# Patient Record
Sex: Male | Born: 1960 | Race: Black or African American | Marital: Married | Smoking: Former smoker
Health system: Northeastern US, Academic
[De-identification: ages and names within clinical notes are randomized; demographics above are authoritative.]

## PROBLEM LIST (undated history)

## (undated) DIAGNOSIS — N289 Disorder of kidney and ureter, unspecified: Secondary | ICD-10-CM

## (undated) DIAGNOSIS — M1990 Unspecified osteoarthritis, unspecified site: Secondary | ICD-10-CM

## (undated) DIAGNOSIS — I509 Heart failure, unspecified: Secondary | ICD-10-CM

## (undated) DIAGNOSIS — Z992 Dependence on renal dialysis: Secondary | ICD-10-CM

## (undated) DIAGNOSIS — K635 Polyp of colon: Secondary | ICD-10-CM

## (undated) DIAGNOSIS — E7800 Pure hypercholesterolemia, unspecified: Secondary | ICD-10-CM

## (undated) DIAGNOSIS — K219 Gastro-esophageal reflux disease without esophagitis: Secondary | ICD-10-CM

## (undated) DIAGNOSIS — I959 Hypotension, unspecified: Secondary | ICD-10-CM

## (undated) DIAGNOSIS — E079 Disorder of thyroid, unspecified: Secondary | ICD-10-CM

## (undated) DIAGNOSIS — N186 End stage renal disease: Secondary | ICD-10-CM

## (undated) DIAGNOSIS — K559 Vascular disorder of intestine, unspecified: Secondary | ICD-10-CM

## (undated) HISTORY — PX: OTHER SURGICAL HISTORY: SHX169

## (undated) HISTORY — PX: DIALYSIS FISTULA CREATION: SHX611

## (undated) HISTORY — PX: HERNIA REPAIR: SHX51

---

## 1997-08-16 ENCOUNTER — Inpatient Hospital Stay (HOSPITAL_COMMUNITY): Admission: EM | Admit: 1997-08-16 | Discharge: 1997-08-19 | Payer: Self-pay | Admitting: Emergency Medicine

## 1997-09-13 ENCOUNTER — Emergency Department (HOSPITAL_COMMUNITY): Admission: EM | Admit: 1997-09-13 | Discharge: 1997-09-13 | Payer: Self-pay | Admitting: Emergency Medicine

## 1997-09-14 ENCOUNTER — Other Ambulatory Visit: Admission: RE | Admit: 1997-09-14 | Discharge: 1997-09-14 | Payer: Self-pay | Admitting: *Deleted

## 1997-09-16 ENCOUNTER — Other Ambulatory Visit: Admission: RE | Admit: 1997-09-16 | Discharge: 1997-09-16 | Payer: Self-pay | Admitting: *Deleted

## 1997-09-27 ENCOUNTER — Other Ambulatory Visit: Admission: RE | Admit: 1997-09-27 | Discharge: 1997-09-27 | Payer: Self-pay | Admitting: *Deleted

## 1997-11-02 ENCOUNTER — Ambulatory Visit (HOSPITAL_COMMUNITY): Admission: RE | Admit: 1997-11-02 | Discharge: 1997-11-02 | Payer: Self-pay | Admitting: *Deleted

## 1997-12-29 ENCOUNTER — Emergency Department (HOSPITAL_COMMUNITY): Admission: EM | Admit: 1997-12-29 | Discharge: 1997-12-29 | Payer: Self-pay | Admitting: Emergency Medicine

## 1998-02-09 ENCOUNTER — Emergency Department (HOSPITAL_COMMUNITY): Admission: EM | Admit: 1998-02-09 | Discharge: 1998-02-09 | Payer: Self-pay | Admitting: Emergency Medicine

## 1998-02-19 ENCOUNTER — Ambulatory Visit (HOSPITAL_COMMUNITY): Admission: RE | Admit: 1998-02-19 | Discharge: 1998-02-20 | Payer: Self-pay | Admitting: General Surgery

## 1998-02-19 ENCOUNTER — Ambulatory Visit (HOSPITAL_COMMUNITY): Admission: RE | Admit: 1998-02-19 | Discharge: 1998-02-19 | Payer: Self-pay | Admitting: Nephrology

## 1998-02-21 ENCOUNTER — Encounter: Payer: Self-pay | Admitting: Nephrology

## 1998-02-21 ENCOUNTER — Ambulatory Visit (HOSPITAL_COMMUNITY): Admission: RE | Admit: 1998-02-21 | Discharge: 1998-02-21 | Payer: Self-pay | Admitting: Nephrology

## 1998-04-27 ENCOUNTER — Ambulatory Visit (HOSPITAL_COMMUNITY): Admission: RE | Admit: 1998-04-27 | Discharge: 1998-04-27 | Payer: Self-pay | Admitting: *Deleted

## 1998-05-03 ENCOUNTER — Emergency Department (HOSPITAL_COMMUNITY): Admission: EM | Admit: 1998-05-03 | Discharge: 1998-05-03 | Payer: Self-pay | Admitting: Emergency Medicine

## 1998-05-03 ENCOUNTER — Encounter: Payer: Self-pay | Admitting: Emergency Medicine

## 1998-08-15 ENCOUNTER — Ambulatory Visit (HOSPITAL_COMMUNITY): Admission: RE | Admit: 1998-08-15 | Discharge: 1998-08-15 | Payer: Self-pay | Admitting: Nephrology

## 1998-08-15 ENCOUNTER — Encounter: Payer: Self-pay | Admitting: Nephrology

## 1998-09-02 ENCOUNTER — Encounter: Payer: Self-pay | Admitting: Vascular Surgery

## 1998-09-02 ENCOUNTER — Ambulatory Visit (HOSPITAL_COMMUNITY): Admission: RE | Admit: 1998-09-02 | Discharge: 1998-09-02 | Payer: Self-pay | Admitting: Vascular Surgery

## 1998-11-02 ENCOUNTER — Ambulatory Visit (HOSPITAL_COMMUNITY): Admission: RE | Admit: 1998-11-02 | Discharge: 1998-11-02 | Payer: Self-pay | Admitting: Nephrology

## 1999-01-11 ENCOUNTER — Other Ambulatory Visit: Admission: RE | Admit: 1999-01-11 | Discharge: 1999-01-11 | Payer: Self-pay

## 2000-07-20 ENCOUNTER — Encounter: Payer: Self-pay | Admitting: Vascular Surgery

## 2000-07-20 ENCOUNTER — Observation Stay (HOSPITAL_COMMUNITY): Admission: EM | Admit: 2000-07-20 | Discharge: 2000-07-21 | Payer: Self-pay | Admitting: *Deleted

## 2000-08-07 ENCOUNTER — Encounter: Payer: Self-pay | Admitting: Vascular Surgery

## 2000-08-09 ENCOUNTER — Encounter: Payer: Self-pay | Admitting: Vascular Surgery

## 2000-08-09 ENCOUNTER — Ambulatory Visit (HOSPITAL_COMMUNITY): Admission: RE | Admit: 2000-08-09 | Discharge: 2000-08-09 | Payer: Self-pay | Admitting: Vascular Surgery

## 2000-08-12 ENCOUNTER — Ambulatory Visit (HOSPITAL_COMMUNITY): Admission: RE | Admit: 2000-08-12 | Discharge: 2000-08-12 | Payer: Self-pay | Admitting: Nephrology

## 2000-08-12 ENCOUNTER — Encounter: Payer: Self-pay | Admitting: Nephrology

## 2000-10-05 ENCOUNTER — Inpatient Hospital Stay (HOSPITAL_COMMUNITY): Admission: AD | Admit: 2000-10-05 | Discharge: 2000-10-08 | Payer: Self-pay | Admitting: Nephrology

## 2000-10-07 ENCOUNTER — Encounter: Payer: Self-pay | Admitting: Nephrology

## 2001-02-23 ENCOUNTER — Emergency Department (HOSPITAL_COMMUNITY): Admission: EM | Admit: 2001-02-23 | Discharge: 2001-02-23 | Payer: Self-pay | Admitting: *Deleted

## 2002-08-19 IMAGING — XA IR [PERSON_NAME]/EXT/UNI*R*
1 series · 9 of 9 positions shown · non-contrast
Comparison: none

FINDINGS
CLINICAL DATA: RENAL FAILURE, QUESTION RIGHT VENOUS OCCLUSION.
RIGHT UPPER EXTREMITY VENOGRAM - 08/09/00:
WRITTEN INFORMED CONSENT WAS OBTAINED FROM THE PATIENT FOR THE PROCEDURE.  THE PATIENT CAME TO
INTERVENTIONAL RADIOLOGY WITH AN IV IN THE RIGHT UPPER FOREARM.  THIS WAS INJECTED WITH 30 CC OF
NONIONIC CONTRAST.  THIS SHOWS FILLING OF WHAT APPEARS TO BE THE RIGHT BASILIC VEIN AND CEPHALIC
VEIN, BOTH OF WHICH ARE PATENT.  CENTRALLY, THERE IS OCCLUSION OF THE RIGHT SUBCLAVIAN VEIN JUST
ABOVE THE RIGHT BRACHIOCEPHALIC VEIN, WITH MULTIPLE COLLATERAL VEINS FILLING IN THE NECK AND CHEST.
 RIGHT-SIDED INTERNAL JUGULAR DIALYSIS CATHETER IS SEEN WITH THE TIPS AT THE CAVOATRIAL JUNCTION
AND IN THE RIGHT ATRIUM.
IMPRESSION
CHRONIC OCCLUSION OF THE RIGHT SUBCLAVIAN VEIN JUST ABOVE THE RIGHT BRACHIOCEPHALIC VEIN.
DR. AREGAHEGN DEO WAS NOTIFIED OF THESE FINDINGS ON 08/09/00 AT THE TIME OF THE PROCEDURE.

[Series 1: run · 9 of 9 slices shown]
[im 1/9]
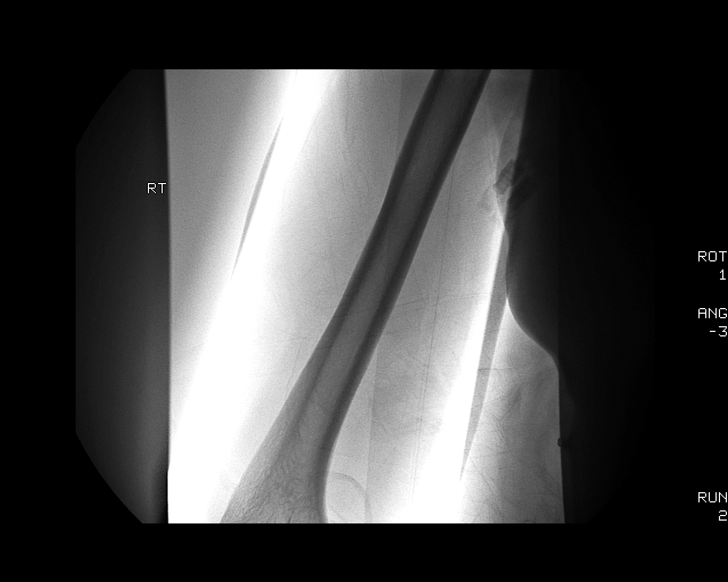
[im 2/9]
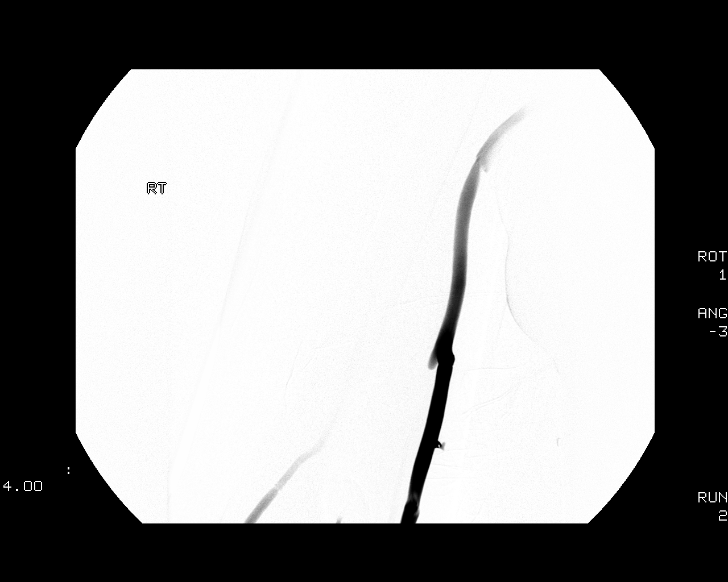
[im 3/9]
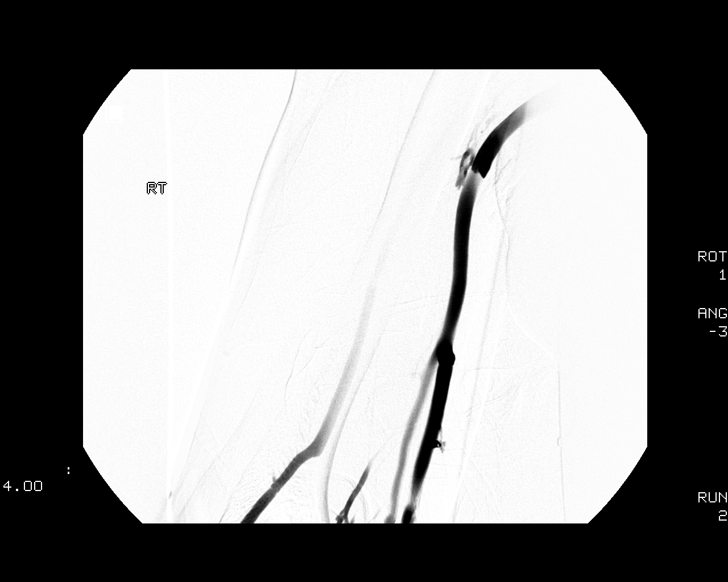
[im 4/9]
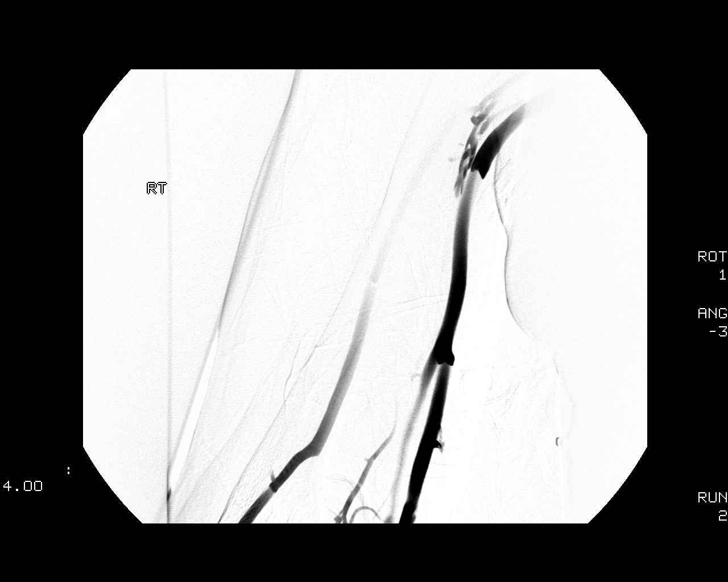
[im 5/9]
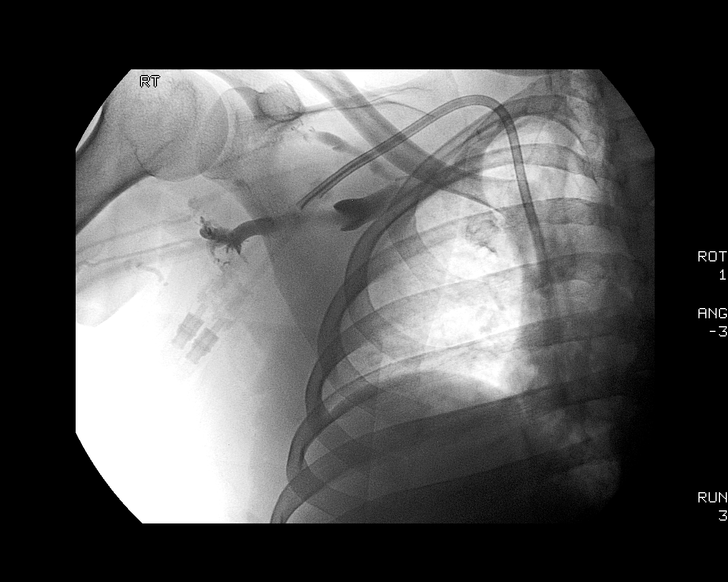
[im 6/9]
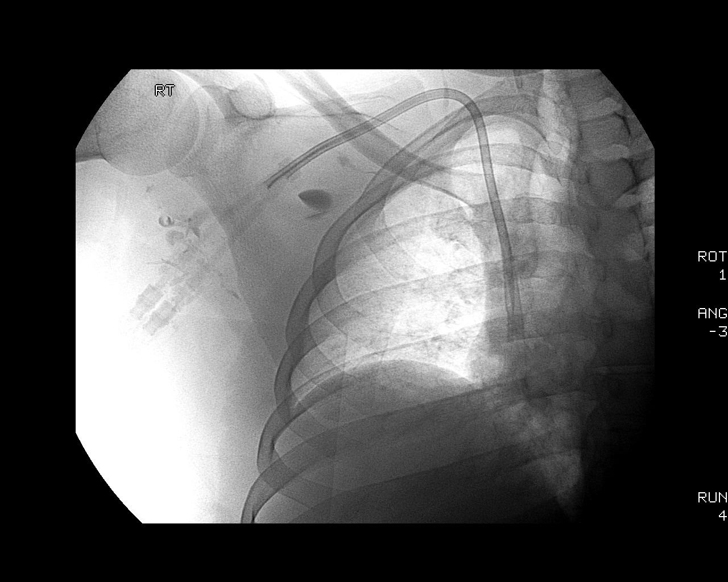
[im 7/9]
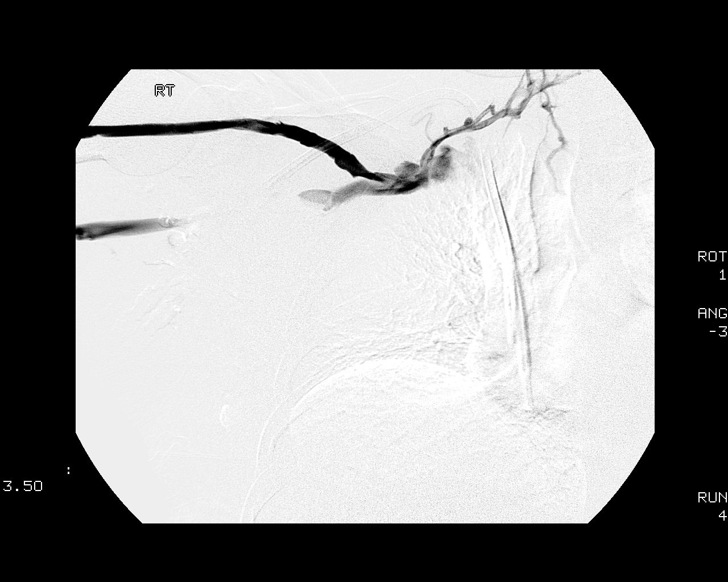
[im 8/9]
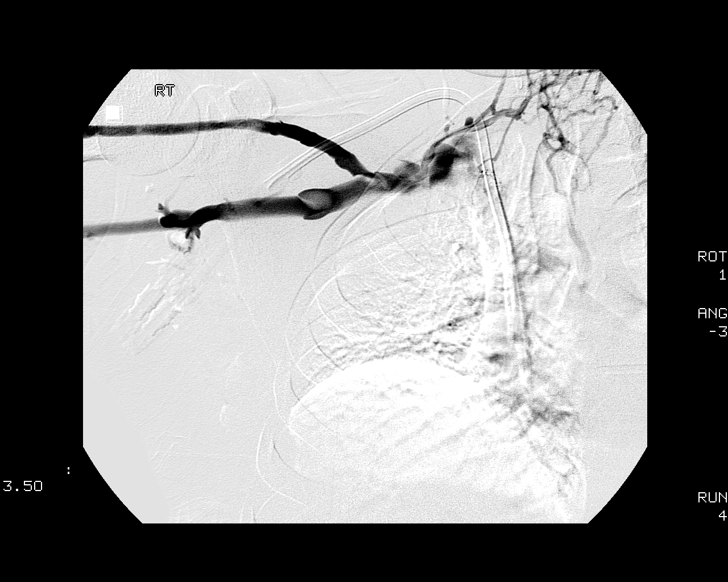
[im 9/9]
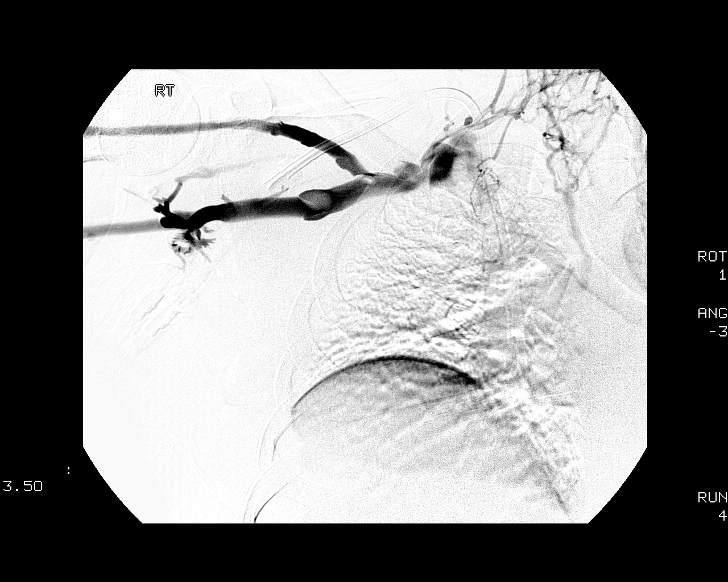

[9 of 9 positions shown; findings below may reference images not displayed]

## 2006-03-18 ENCOUNTER — Encounter: Payer: Self-pay | Admitting: Cardiology

## 2008-11-09 ENCOUNTER — Encounter: Payer: Self-pay | Admitting: Cardiology

## 2009-01-11 ENCOUNTER — Ambulatory Visit
Admit: 2009-01-11 | Discharge: 2009-01-11 | Disposition: A | Discharge status: Home / Self Care | Payer: Self-pay | Source: Ambulatory Visit | Admitting: Nephrology

## 2009-02-14 ENCOUNTER — Ambulatory Visit
Admit: 2009-02-14 | Discharge: 2009-02-14 | Disposition: A | Discharge status: Home / Self Care | Payer: Self-pay | Source: Ambulatory Visit | Admitting: Nephrology

## 2009-03-15 ENCOUNTER — Ambulatory Visit
Admit: 2009-03-15 | Discharge: 2009-03-15 | Disposition: A | Discharge status: Home / Self Care | Payer: Self-pay | Source: Ambulatory Visit | Admitting: Nephrology

## 2009-04-12 ENCOUNTER — Ambulatory Visit
Admit: 2009-04-12 | Discharge: 2009-04-12 | Disposition: A | Discharge status: Home / Self Care | Payer: Self-pay | Source: Ambulatory Visit | Attending: Nephrology | Admitting: Nephrology

## 2009-04-12 LAB — DATE/TIME NOT PROVIDED

## 2009-04-12 LAB — POTASSIUM: Potassium: 6.1 mmol/L — ABNORMAL HIGH (ref 3.3–5.1)

## 2009-05-05 ENCOUNTER — Ambulatory Visit
Admit: 2009-05-05 | Discharge: 2009-05-05 | Disposition: A | Discharge status: Home / Self Care | Payer: Self-pay | Source: Ambulatory Visit | Admitting: Nephrology

## 2009-07-12 ENCOUNTER — Ambulatory Visit
Admit: 2009-07-12 | Discharge: 2009-07-12 | Disposition: A | Discharge status: Home / Self Care | Payer: Self-pay | Source: Ambulatory Visit | Admitting: Nephrology

## 2009-08-18 ENCOUNTER — Other Ambulatory Visit: Payer: Self-pay | Admitting: Nurse Practitioner

## 2009-08-18 ENCOUNTER — Ambulatory Visit
Admit: 2009-08-18 | Discharge: 2009-08-18 | Disposition: A | Discharge status: Home / Self Care | Payer: Self-pay | Source: Ambulatory Visit | Admitting: Nephrology

## 2009-08-22 ENCOUNTER — Ambulatory Visit
Admit: 2009-08-22 | Discharge: 2009-08-22 | Disposition: A | Discharge status: Home / Self Care | Payer: Self-pay | Source: Ambulatory Visit | Attending: Nephrology | Admitting: Nephrology

## 2009-09-13 ENCOUNTER — Other Ambulatory Visit: Payer: Self-pay | Admitting: Radiology

## 2009-09-13 ENCOUNTER — Ambulatory Visit: Admit: 2009-09-13 | Payer: Self-pay | Source: Ambulatory Visit | Admitting: Radiology

## 2009-09-22 ENCOUNTER — Other Ambulatory Visit: Payer: Self-pay | Admitting: Nephrology

## 2009-09-23 ENCOUNTER — Ambulatory Visit
Admit: 2009-09-23 | Discharge: 2009-09-23 | Disposition: A | Discharge status: Home / Self Care | Payer: Self-pay | Source: Ambulatory Visit | Attending: Nephrology | Admitting: Nephrology

## 2009-09-23 LAB — PROTIME-INR
INR: 1 (ref 0.9–1.1)
Protime: 13.6 s (ref 11.9–14.7)

## 2009-09-23 MED ORDER — HEPARIN SODIUM 5000 UNIT/ML SQ *I*
SUBCUTANEOUS | Status: AC
Start: 2009-09-23 — End: 2009-09-23
  Filled 2009-09-23: qty 4

## 2009-09-23 MED ORDER — MIDAZOLAM HCL 1 MG/ML IJ SOLN *I* WRAPPED
INTRAMUSCULAR | Status: AC
Start: 2009-09-23 — End: 2009-09-23
  Filled 2009-09-23: qty 5

## 2009-09-23 MED ORDER — HEPARIN SODIUM (PORCINE) 1000 UNIT/ML IJ SOLN *WRAPPED*
Status: AC
Start: 2009-09-23 — End: 2009-09-23
  Filled 2009-09-23: qty 10

## 2009-09-23 MED ORDER — VANCOMYCIN HCL IN DEXTROSE 500 MG/100ML IV SOLN *I*
INTRAVENOUS | Status: DC
Start: 2009-09-23 — End: 2009-09-23
  Filled 2009-09-23: qty 100

## 2009-09-23 MED ORDER — HEPARIN SODIUM 5000 UNIT/ML SQ *I*
SUBCUTANEOUS | Status: AC
Start: 2009-09-23 — End: 2009-09-23
  Filled 2009-09-23: qty 2

## 2009-09-23 MED ORDER — FENTANYL CITRATE 50 MCG/ML IJ SOLN *WRAPPED*
INTRAMUSCULAR | Status: AC
Start: 2009-09-23 — End: 2009-09-23
  Filled 2009-09-23: qty 4

## 2009-09-23 MED ORDER — HEPARIN 100 UNIT/ML IV SOLN *A*
INTRAVENOUS | Status: AC
Start: 2009-09-23 — End: 2009-09-23
  Filled 2009-09-23: qty 5

## 2009-09-23 MED ORDER — LIDOCAINE HCL 1 % IJ SOLN
INTRAMUSCULAR | Status: AC
Start: 2009-09-23 — End: 2009-09-23
  Filled 2009-09-23: qty 30

## 2009-09-23 MED ORDER — VANCOMYCIN HCL 500 MG IV SOLR (50 MG/ML) *I*
INTRAVENOUS | Status: AC
Start: 2009-09-23 — End: 2009-09-23
  Filled 2009-09-23: qty 500

## 2009-10-13 ENCOUNTER — Ambulatory Visit
Admit: 2009-10-13 | Discharge: 2009-10-13 | Disposition: A | Discharge status: Home / Self Care | Payer: Self-pay | Source: Ambulatory Visit | Admitting: Nephrology

## 2009-11-15 ENCOUNTER — Ambulatory Visit
Admit: 2009-11-15 | Discharge: 2009-11-15 | Disposition: A | Discharge status: Home / Self Care | Payer: Self-pay | Source: Ambulatory Visit | Admitting: Nephrology

## 2009-12-14 ENCOUNTER — Ambulatory Visit
Admit: 2009-12-14 | Discharge: 2009-12-14 | Disposition: A | Discharge status: Home / Self Care | Payer: Self-pay | Source: Ambulatory Visit | Attending: Nephrology | Admitting: Nephrology

## 2009-12-14 ENCOUNTER — Other Ambulatory Visit: Payer: Self-pay | Admitting: Nephrology

## 2010-01-12 ENCOUNTER — Ambulatory Visit
Admit: 2010-01-12 | Discharge: 2010-01-12 | Disposition: A | Discharge status: Home / Self Care | Payer: Self-pay | Source: Ambulatory Visit | Admitting: Nephrology

## 2010-01-24 ENCOUNTER — Ambulatory Visit
Admit: 2010-01-24 | Discharge: 2010-01-24 | Disposition: A | Discharge status: Home / Self Care | Payer: Self-pay | Source: Ambulatory Visit | Attending: Nephrology | Admitting: Nephrology

## 2010-01-24 LAB — POTASSIUM: Potassium: 4.8 mmol/L (ref 3.3–5.1)

## 2010-01-24 LAB — DATE/TIME NOT PROVIDED

## 2010-02-14 ENCOUNTER — Ambulatory Visit
Admit: 2010-02-14 | Discharge: 2010-02-14 | Disposition: A | Discharge status: Home / Self Care | Payer: Self-pay | Source: Ambulatory Visit | Admitting: Nephrology

## 2010-02-16 ENCOUNTER — Other Ambulatory Visit: Payer: Self-pay | Admitting: Nephrology

## 2010-02-22 ENCOUNTER — Ambulatory Visit
Admit: 2010-02-22 | Discharge: 2010-02-22 | Disposition: A | Discharge status: Home / Self Care | Payer: Self-pay | Source: Ambulatory Visit | Attending: Nephrology | Admitting: Nephrology

## 2010-03-16 ENCOUNTER — Ambulatory Visit
Admit: 2010-03-16 | Discharge: 2010-03-16 | Disposition: A | Discharge status: Home / Self Care | Payer: Self-pay | Source: Ambulatory Visit | Admitting: Nephrology

## 2010-04-18 ENCOUNTER — Ambulatory Visit
Admit: 2010-04-18 | Discharge: 2010-04-18 | Disposition: A | Discharge status: Home / Self Care | Payer: Self-pay | Source: Ambulatory Visit | Admitting: Nephrology

## 2010-05-11 ENCOUNTER — Ambulatory Visit
Admit: 2010-05-11 | Discharge: 2010-05-11 | Disposition: A | Discharge status: Home / Self Care | Payer: Self-pay | Source: Ambulatory Visit | Admitting: Nephrology

## 2010-06-15 ENCOUNTER — Other Ambulatory Visit: Payer: Self-pay | Admitting: Nephrology

## 2010-06-21 ENCOUNTER — Ambulatory Visit
Admit: 2010-06-21 | Discharge: 2010-06-21 | Disposition: A | Discharge status: Home / Self Care | Payer: Self-pay | Source: Ambulatory Visit | Attending: Nephrology | Admitting: Nephrology

## 2010-06-26 ENCOUNTER — Encounter: Payer: Self-pay | Admitting: Gastroenterology

## 2010-06-26 ENCOUNTER — Other Ambulatory Visit: Payer: Self-pay | Admitting: Gastroenterology

## 2010-06-26 ENCOUNTER — Ambulatory Visit: Payer: Self-pay

## 2010-06-26 NOTE — Miscellaneous (Unsigned)
 Continuity of Care Record  Created: todo  From: DHAKAL, MADHAVENDRA  From:   From: [employer], EHR v10.2.7.53  To: Romm, Nolton  Purpose: Patient Use;       Medications  Calcium + D 600-200 MG-UNIT Tablet; TAKE 1 TABLET DAILY. ; RPT   Clindamycin HCl 300 MG Capsule; TAKE 1 TABLET  as directed prior to dental   work ; RPT   Diflunisal 500 MG Tablet ; RPT   Dulcolax 100 MG CAPS ; RPT   Fludrocortisone Acetate 0.1 MG Tablet; TAKE AS DIRECTED. ; RPT   Fluticasone Propionate 50 MCG/ACT Suspension; USE AS DIRECTED. ; RPT   Lipitor 10 MG Tablet; TAKE 1 TABLET DAILY AT BEDTIME. ; RPT   Midodrine HCl 10 MG Tablet; TAKE 1 TABLET DAILY ; RPT   Minocycline HCl 100 MG Capsule ; RPT   MiraLax Packet; USE AS DIRECTED. ; RPT   Nephro-Vite 0.8 MG Tablet; TAKE AS DIRECTED. ; RPT   Omeprazole 20 MG Capsule Delayed Release ; RPT   Renagel 800 MG Tablet; TAKE 1 TABLET DAILY ; RPT   Sensipar 30 MG Tablet ; RPT   Sodium Chloride TABS; USE AS DIRECTED. ; RPT

## 2010-07-13 ENCOUNTER — Ambulatory Visit
Admit: 2010-07-13 | Discharge: 2010-07-13 | Disposition: A | Discharge status: Home / Self Care | Payer: Self-pay | Source: Ambulatory Visit | Admitting: Nephrology

## 2010-08-03 ENCOUNTER — Ambulatory Visit: Payer: Self-pay | Admitting: Transplant Surgery

## 2010-08-03 VITALS — BP 70/50 | HR 92 | Temp 97.8°F | Ht 64.0 in | Wt 193.2 lb

## 2010-08-03 DIAGNOSIS — N289 Disorder of kidney and ureter, unspecified: Secondary | ICD-10-CM

## 2010-08-03 NOTE — Progress Notes (Signed)
 HPI:  Shane Pearson is a 50 year old gentleman who comes in for evaluation of  transplantation.  He indicates he was perfectly healthy until the mid 1980s  when he developed hypertension.  In 1986, he developed headaches as a  result of medications he was taking and upon following up with his  physician he was found to have proteinuria.  He followed up with nephrology  for the next 10 years and initiated peritoneal dialysis in 1998.  He did  well with peritoneal dialysis but unfortunately it only lasted for  approximately 1 year.  In 1999, he initiated hemodialysis and at the time  when he initiated he still had fairly high blood pressure and remained on  blood pressure medication for approximately 3 to 4 years.  In the next 3 to  4 years, he required a decrease in his blood pressure medications and then  subsequently in the following 3 to 4 years his blood pressure medications  were completely stopped.  Interestingly, in the next 3 to 4 years and  currently, he became hypotensive and he was started on midodrine and  Florinef to support his blood pressure.  He continues working full-time.  He is fairly active.  He enjoys his current life.  He indicates that he  does work full-time, he goes to church with regularity, and even vacations  with some regularity.    PAST MEDICAL HISTORY:  Significant for he was hypertensive and now he is  hypotensive requiring Florinef and Midodrine.  He has end-stage renal  disease and he has a history of sinusitis.    PAST SURGICAL HISTORY:  Significant for right inguinal hernia in 1985,  peritoneal dialysis catheter placement, and left subclavian catheter and  fistulas x 3, all of which have thrombosed.    FAMILY HISTORY:  His mother has a history of diabetes.  No history of  cancer.    DISCUSSION:  As we proceeded with the evaluation, I happened to ask Mr.  Pearson what, in fact, took so long for him to come through to be evaluated  for transplantation since he had initiated dialysis in  1999.  In short, he  responded that really this had been through somewhat extensive deliberation  with his nephrologist and that in fact he did not really feel that he  wanted a transplant and did not like the idea of the lack of guarantees  associated with transplantation.  Namely, he indicated that he did not like  the idea of being out of work for any extended period of time and also did  not like the idea of the side effects, specifically the result of diabetes  from immunosuppressive medications.  We had a very long and frank  discussion and Dr. Felicie Morn also participated in the discussion, and  essentially at this point Shane Pearson does not want to be listed for  evaluated for transplantation and we indicated that we would be happy to  reevaluate him in the near future or distant future and that at present he  should just go home and think about these issues and potentially the  disease progression that is associated with dialysis and that we would  rediscuss his options with him in the near future.                Electronically Signed and Finalized  by  Shaune Pollack, MD 08/09/2010 16:16  ___________________________________________  Shaune Pollack, MD      DD:   08/03/2010  DT:   08/03/2010  1:49 P  [id number]  [id number]    cc:

## 2010-08-05 ENCOUNTER — Encounter: Payer: Self-pay | Admitting: Critical Care Medicine

## 2010-08-05 ENCOUNTER — Emergency Department
Admission: EM | Admit: 2010-08-05 | Disposition: A | Discharge status: Home / Self Care | Payer: Self-pay | Source: Ambulatory Visit | Attending: Emergency Medicine | Admitting: Emergency Medicine

## 2010-08-05 ENCOUNTER — Ambulatory Visit
Admit: 2010-08-05 | Discharge: 2010-08-05 | Disposition: A | Discharge status: Home / Self Care | Payer: Self-pay | Source: Ambulatory Visit | Attending: Nephrology | Admitting: Nephrology

## 2010-08-05 ENCOUNTER — Other Ambulatory Visit: Payer: Self-pay | Admitting: Gastroenterology

## 2010-08-05 HISTORY — DX: End stage renal disease: N18.6

## 2010-08-05 HISTORY — DX: Pure hypercholesterolemia, unspecified: E78.00

## 2010-08-05 HISTORY — DX: Dependence on renal dialysis: Z99.2

## 2010-08-05 LAB — CBC
Hematocrit: 42 % (ref 40–51)
Hemoglobin: 13.4 g/dL — ABNORMAL LOW (ref 13.7–17.5)
MCV: 89 fL (ref 79–92)
Platelets: 197 10*3/uL (ref 150–330)
RBC: 4.7 MIL/uL (ref 4.6–6.1)
RDW: 16.4 % — ABNORMAL HIGH (ref 11.6–14.4)
WBC: 8.9 10*3/uL (ref 4.2–9.1)

## 2010-08-05 LAB — CBC AND DIFFERENTIAL
Baso # K/uL: 0 10*3/uL (ref 0.0–0.1)
Basophil %: 0.2 % (ref 0.2–1.2)
Eos # K/uL: 0.5 10*3/uL (ref 0.0–0.5)
Eosinophil %: 5.2 % (ref 0.8–7.0)
Hematocrit: 43 % (ref 40–51)
Hemoglobin: 14 g/dL (ref 13.7–17.5)
Lymph # K/uL: 1.9 10*3/uL (ref 1.3–3.6)
Lymphocyte %: 21.5 % — ABNORMAL LOW (ref 21.8–53.1)
MCV: 89 fL (ref 79–92)
Mono # K/uL: 0.8 10*3/uL (ref 0.3–0.8)
Monocyte %: 8.4 % (ref 5.3–12.2)
Neut # K/uL: 5.8 10*3/uL — ABNORMAL HIGH (ref 1.8–5.4)
Platelets: 205 10*3/uL (ref 150–330)
RBC: 4.9 MIL/uL (ref 4.6–6.1)
RDW: 16.5 % — ABNORMAL HIGH (ref 11.6–14.4)
Seg Neut %: 64.7 % (ref 34.0–67.9)
WBC: 8.9 10*3/uL (ref 4.2–9.1)

## 2010-08-05 LAB — PROTIME-INR
INR: 1.1 (ref 0.9–1.1)
INR: 1 (ref 0.9–1.1)
Protime: 13.8 s (ref 11.9–14.7)
Protime: 13.4 s (ref 11.9–14.7)

## 2010-08-05 LAB — POTASSIUM: Potassium: 5.2 mmol/L — ABNORMAL HIGH (ref 3.3–5.1)

## 2010-08-05 LAB — APTT
aPTT: 36.6 s — ABNORMAL HIGH (ref 22.3–35.3)
aPTT: 65.2 s — ABNORMAL HIGH (ref 22.3–35.3)

## 2010-08-05 LAB — BLOOD BANK HOLD LAVENDER

## 2010-08-05 LAB — BASIC METABOLIC PANEL
Anion Gap: 18 — ABNORMAL HIGH (ref 7–16)
CO2: 23 mmol/L (ref 20–28)
Calcium: 8.4 mg/dL — ABNORMAL LOW (ref 8.6–10.2)
Chloride: 94 mmol/L — ABNORMAL LOW (ref 96–108)
Creatinine: 16.28 mg/dL — ABNORMAL HIGH (ref 0.67–1.17)
GFR,Black: 4 * — AB
GFR,Caucasian: 3 * — AB
Glucose: 81 mg/dL (ref 74–106)
Lab: 55 mg/dL — ABNORMAL HIGH (ref 6–20)
Potassium: 5.5 mmol/L — ABNORMAL HIGH (ref 3.3–5.1)
Sodium: 135 mmol/L (ref 133–145)

## 2010-08-05 LAB — HOLD GRAY

## 2010-08-05 LAB — BLOOD BANK HOLD RED

## 2010-08-05 LAB — HOLD GREEN WITH GEL

## 2010-08-05 MED ORDER — FENTANYL CITRATE 50 MCG/ML IJ SOLN *WRAPPED*
INTRAMUSCULAR | Status: DC
Start: 2010-08-05 — End: 2010-08-05
  Filled 2010-08-05: qty 2

## 2010-08-05 MED ORDER — CEFAZOLIN 1000 MG IN STERILE WATER 10ML SYRINGE *I*
PREFILLED_SYRINGE | INTRAVENOUS | Status: DC
Start: 2010-08-05 — End: 2010-08-05
  Filled 2010-08-05: qty 20

## 2010-08-05 MED ORDER — HEPARIN SODIUM (PORCINE) 1000 UNIT/ML IJ SOLN *WRAPPED*
0.0000 mL | Status: DC | PRN
Start: 2010-08-05 — End: 2010-08-06
  Administered 2010-08-05: 2.6 mL

## 2010-08-05 MED ORDER — MIDAZOLAM HCL 1 MG/ML IJ SOLN *I* WRAPPED
INTRAMUSCULAR | Status: DC
Start: 2010-08-05 — End: 2010-08-05
  Filled 2010-08-05: qty 5

## 2010-08-05 MED ORDER — PARICALCITOL 2 MCG/ML IV SOLN *I*
8.0000 ug | INTRAVENOUS | Status: DC | PRN
Start: 2010-08-05 — End: 2010-08-06

## 2010-08-05 MED ORDER — VANCOMYCIN HCL IN DEXTROSE 500 MG/100ML IV SOLN *I*
INTRAVENOUS | Status: AC
Start: 2010-08-05 — End: 2010-08-05
  Filled 2010-08-05: qty 200

## 2010-08-05 MED ORDER — PARICALCITOL 2 MCG/ML IV SOLN *I*
INTRAVENOUS | Status: AC
Start: 2010-08-05 — End: 2010-08-05
  Administered 2010-08-05: 8 ug via INTRAVENOUS
  Filled 2010-08-05: qty 4

## 2010-08-05 MED ORDER — HEPARIN SODIUM (PORCINE) 1000 UNIT/ML IJ SOLN *WRAPPED*
0.0000 mL | Status: DC | PRN
Start: 2010-08-05 — End: 2010-08-06
  Administered 2010-08-05: 2.8 mL

## 2010-08-05 NOTE — ED Provider Progress Notes (Signed)
 ED Provider Progress Note     Patient returned from IR and HD.  Feeling better.  Will discharge to follow with HD on Tuesday and instruct to return from complications of HD cath.    Dispo: D/C.      Brandon Scarbrough Erlinda Hong, MD, 08/05/2010, 7:06 PM

## 2010-08-05 NOTE — ED Notes (Signed)
 Pt states he is ready to go home.  Completed his dialysis.  MD writing his discharge papers now.  Will take out his IV.

## 2010-08-05 NOTE — ED Notes (Signed)
 Pt sent in because his dialysis catheter is not working, missed dialysis this morning

## 2010-08-05 NOTE — Discharge Instructions (Signed)
You were seen and evaluated today after your home hemodialysis facility could not access your catheter.  Your testing is summarized above.  Your catheter was replaced and your received dialysis today.  You should keep your next hemodialysis appointment and follow up with your primary care doctor as needed.    Please read the home care instructions regarding the care of your catheter.    If you experience bleeding from your catheter site, spreading redness from the site, fainting, chest pain, difficulty breathing or ANY WORSENING OR CONCERNING SYMPTOM return to the emergency department or call 911.

## 2010-08-05 NOTE — Procedures (Signed)
 Procedure Report    Please enter procedure, pre- and and post-procedure diagnoses in fields above and removed this line of text.    PROCEDURE NOTE    Time out documentation completed in Procedure navigator prior to procedure:  Yes    Indications  Malfunctioning dialysis catheter    Procedure Details  Removal of old catheter, plasty of fibrin sheath and placement of new cath    Findings Fibrin sheath from tip of catheter to right atrium    Complications  None    Condition  good    Plan/Orders  Chest Xray later    EBL: 20 cc    Specimens  no    Disposition  Back to ED    Toma Copier, MD  08/05/2010  2:20 PM

## 2010-08-05 NOTE — Consults (Addendum)
 Nephrology Attending Attestation: 08/05/2010 3:28 PM  _________________________________________________________________________________  Overall Impression and Recommendations:      Asked to see this patient for provision of hemodialysis after catheter placement.50 y.o. male currently with end-stage renal disease on hemodialysis with a malfunctioning tunnelled catheter now with a new catheter placed today by IR.  He was seen and examined during hemodialysis     Endf-stage renal disease - currently dialyzing on F180 x 4 hours, 2K bath, UF 3 kg as toleraged   Dialysis access - new catheter functioning well    I have seen and examined the patient.  Please see the eMR  for supporting details, medications, labs, imaging, and progress notes, which I have personally reviewed.  The renal fellow note below reflects my own examination, input and findings.  I have also discussed relevant management issues with the Nephrology Team and the team caring for the patient.   _________________________________________________________________________________  Felicie Morn, MD,PHD:  08/05/2010 3:28 PM  Nephrology Fellow (Dialysis) Consult Note 08/05/2010 11:56 AM    Reason for consultation: Nephrology service was asked to see and provide management of ESRD on HD.  Request provider: Darlina Guys, MD  History of present illness:  50 y.o.male with ESRD secondary to hypertension + proteinuria on HD for 13 years at [facility] sechedule TTS, last HD on 08/03/2010 presented to [facility] for HD catheter replacement as his catheter is not providing enough blood flow for HD. This catheter has been in place for 6 weeks.  He is currently asymptomatic and we are consulting for provision of hemodialysis. Dr Arlyce Dice sent him here to have his HD catheter changed by IR.    Past Medical History   Diagnosis Date   . ESRD (end stage renal disease)    . Dialysis patient    . Hypercholesterolemia    HTN  Anemia    Past Surgical History   Procedure Date    . Right inguinal hernia repair    Multiple AVFs    Allergies   Allergen Reactions   . Coconut Flavor Nausea And Vomiting   . No Known Latex Allergy    . Amoxicillin Rash     History     Social History   . Marital Status: Married     Spouse Name: N/A     Number of Children: N/A   . Years of Education: N/A     Occupational History   . Not on file.     Social History Main Topics   . Smoking status: Former Smoker -- 0.3 packs/day for 15 years     Types: Cigarettes     Quit date: 08/02/1992   . Smokeless tobacco: Not on file   . Alcohol Use: No   . Drug Use: No   . Sexually Active: Not on file     Other Topics Concern   . Not on file     Social History Narrative   . No narrative on file     Family History   Problem Relation Age of Onset   . Diabetes Mother    . Asthma Mother    . Coronary art dis Father      No current facility-administered medications for this encounter.     Current Outpatient Prescriptions   Medication   . sevelamer (RENAGEL) 800 MG tablet   . midodrine (PROAMATINE) 10 MG tablet   . fluticasone (FLONASE) 50 MCG/ACT nasal spray   . atorvastatin (LIPITOR) 10 MG tablet   . fludrocortisone (  FLORINEF) 0.1 mg tablet   . b complex-vitamin c-folic acid (NEPHRO-VITE) tablet   . calcium-vitamin D (OSCAL-500) 500-200 MG-UNIT per tablet   . cinacalcet (SENSIPAR) 30 MG tablet   . omeprazole (PRILOSEC) 20 MG capsule   . docusate sodium (COLACE) 100 MG capsule   . minocycline (MINOCIN) 100 MG capsule   . diflunisal (DOLOBID) 500 MG TABS     Review of Systems - 10 systems review was negative    Physical examination:  Filed Vitals:    08/05/10 1012   BP: 99/46   Pulse: 72   Temp: 36.4 C (97.5 F)   Resp: 16   Height:    Weight:      PHYSICAL EXAM  General Good condition, NAD.  HEENT Pale, hydrated  Neck  No JVD  Heart S1 S2 regular  Lungs  Bilaterally clear  Abdomen Soft, non tender  Ext No edema  Neurologic  Alert, non focal.    Laboratory data:  Recent Results (from the past 24 hour(s))   HOLD GRAY       Component  Value Range    Hold Grey HOLD TUBE     CBC    Collection Time    08/05/10  7:15 AM       Component Value Range    WBC 8.9  4.2 - 9.1 (THOU/uL)    RBC 4.7  4.6 - 6.1 (MIL/uL)    Hemoglobin 13.4 (*) 13.7 - 17.5 (g/dL)    Hematocrit 42  40 - 51 (%)    MCV 89  79 - 92 (fL)    RDW 16.4 (*) 11.6 - 14.4 (%)    Platelets 197  150 - 330 (THOU/uL)   PROTIME-INR    Collection Time    08/05/10  7:15 AM       Component Value Range    Protime 13.8  11.9 - 14.7 (sec)    INR 1.1  0.9 - 1.1    APTT    Collection Time    08/05/10  7:15 AM       Component Value Range    aPTT 36.6 (*) 22.3 - 35.3 (sec)   POTASSIUM    Collection Time    08/05/10  7:15 AM       Component Value Range    Potassium 5.2 (*) 3.3 - 5.1 (mmol/L)   CBC AND DIFFERENTIAL    Collection Time    08/05/10  9:11 AM       Component Value Range    WBC 8.9  4.2 - 9.1 (THOU/uL)    RBC 4.9  4.6 - 6.1 (MIL/uL)    Hemoglobin 14.0  13.7 - 17.5 (g/dL)    Hematocrit 43  40 - 51 (%)    MCV 89  79 - 92 (fL)    RDW 16.5 (*) 11.6 - 14.4 (%)    Platelets 205  150 - 330 (THOU/uL)    Seg Neut % 64.7  34.0 - 67.9 (%)    Lymphocyte % 21.5 (*) 21.8 - 53.1 (%)    Monocyte % 8.4  5.3 - 12.2 (%)    Eosinophil % 5.2  0.8 - 7.0 (%)    Basophil % 0.2  0.2 - 1.2 (%)    Neut # K/uL 5.8 (*) 1.8 - 5.4 (THOU/uL)    Lymph # K/uL 1.9  1.3 - 3.6 (THOU/uL)    Mono # K/uL 0.8  0.3 - 0.8 (THOU/uL)    Eos # K/uL 0.5  0.0 - 0.5 (THOU/uL)    Baso # K/uL 0.0  0.0 - 0.1 (THOU/uL)   BASIC METABOLIC PANEL    Collection Time    08/05/10  9:11 AM       Component Value Range    Glucose 81  74 - 106 (mg/dL)    Sodium 161  096 - 045 (mmol/L)    Potassium 5.5 (*) 3.3 - 5.1 (mmol/L)    Chloride 94 (*) 96 - 108 (mmol/L)    CO2 23  20 - 28 (mmol/L)    Anion Gap 18 (*) 7 - 16     UN 55 (*) 6 - 20 (mg/dL)    Creatinine 40.98 (*) 0.67 - 1.17 (mg/dL)    GFR,Caucasian 3 (*)     GFR,Black 4 (*)     Calcium 8.4 (*) 8.6 - 10.2 (mg/dL)   PROTIME-INR    Collection Time    08/05/10  9:11 AM       Component Value Range    Protime 13.4   11.9 - 14.7 (sec)    INR 1.0  0.9 - 1.1    APTT    Collection Time    08/05/10  9:11 AM       Component Value Range    aPTT 65.2 (*) 22.3 - 35.3 (sec)   HOLD GREEN WITH GEL    Collection Time    08/05/10  9:11 AM       Component Value Range    Hold Green (w/gel,spun) HOLD TUBE     BLOOD BANK HOLD LAVENDER    Collection Time    08/05/10  9:11 AM       Component Value Range    Bld Bank Hld Lav Lav In Bld Bank     BLOOD BANK HOLD RED    Collection Time    08/05/10  9:11 AM       Component Value Range    Bld Bank Hld Red Red In [facility]       Radiology impressions (last 3 days):  No results found.    Assessment:   Shane Pearson is a 50 y.o. male with ESRD on HD admitted for change of HD catheter  And HD after that.    Plan:  1.  ESRD on HD.    -will dialyze on 08/05/2010 with 2.0 K bath. UF minimal given BP; no heparin after he gets his new catheter  -Will obtain outpatient packet from HD unit.  -Continue nephrovite.  2.  Anemia:   -Hgb 14  -Will check outpatient orders for EPO  3.  CKD-BMD:   -Ca 8.4  -No acute intervention needed.  -Will check HD orders for Vit D analogs.  4.  Hypertension:   -BP is low normal now  -Will follow   -Resume home BP medication regimen.  5.  Access:   -To get new HD catheter as above     Thank you indeed for this consult, we will follow along with you    Author: Nicholas Lose, MD  as of: 08/05/2010  at: 11:56 AM

## 2010-08-05 NOTE — Preop H&P (Addendum)
 OUTPATIENT  Chief Complaint: Dialysis catheter malfunctioning    History of Present Illness:  HPI  50 year old male with left dialysis catheter and prior history of multiple episodes of malfunctioning catheter and fibrin sheath angioplasty, presents for malfunctioning dialysis catheter.    Past Medical History   Diagnosis Date   . ESRD (end stage renal disease)    . Dialysis patient    . Hypercholesterolemia      Past Surgical History   Procedure Date   . Right inguinal hernia repair      Family History   Problem Relation Age of Onset   . Diabetes Mother    . Asthma Mother    . Coronary art dis Father      History     Social History   . Marital Status: Married     Spouse Name: N/A     Number of Children: N/A   . Years of Education: N/A     Social History Main Topics   . Smoking status: Former Smoker -- 0.3 packs/day for 15 years     Types: Cigarettes     Quit date: 08/02/1992   . Smokeless tobacco: None   . Alcohol Use: No   . Drug Use: No   . Sexually Active: None     Other Topics Concern   . None     Social History Narrative   . None       Allergies:   Allergies   Allergen Reactions   . Coconut Flavor Nausea And Vomiting   . No Known Latex Allergy    . Amoxicillin Rash       Current Facility-Administered Medications   Medication Dose Route Frequency   . heparin (porcine) injection 0-2,000 Units  0-2 mL Intracatheter PRN   . heparin (porcine) injection 0-2,000 Units  0-2 mL Intracatheter PRN   . paricalcitol (ZEMPLAR) injection 8 mcg  8 mcg Intravenous PRN     Current Outpatient Prescriptions   Medication   . sevelamer (RENAGEL) 800 MG tablet   . midodrine (PROAMATINE) 10 MG tablet   . fluticasone (FLONASE) 50 MCG/ACT nasal spray   . atorvastatin (LIPITOR) 10 MG tablet   . fludrocortisone (FLORINEF) 0.1 mg tablet   . b complex-vitamin c-folic acid (NEPHRO-VITE) tablet   . calcium-vitamin D (OSCAL-500) 500-200 MG-UNIT per tablet   . cinacalcet (SENSIPAR) 30 MG tablet   . omeprazole (PRILOSEC) 20 MG capsule   .  docusate sodium (COLACE) 100 MG capsule   . minocycline (MINOCIN) 100 MG capsule   . diflunisal (DOLOBID) 500 MG TABS        Review of Systems:   ROS    Last Nursing documented pain:  0-10 Scale: 0 (08/05/10 0747)      Patient Vitals for the past 24 hrs:   BP Temp Temp src Pulse Resp SpO2 Height Weight   08/05/10 1307 96/44 mmHg 36.2 C (97.2 F) TEMPORAL 72  16  100 % - -   08/05/10 1012 99/46 mmHg 36.4 C (97.5 F) TEMPORAL 72  16  100 % - -   08/05/10 0747 93/65 mmHg 36.2 C (97.2 F) TEMPORAL 71  18  98 % 162.6 cm (5\' 4" ) 90.266 kg (199 lb)     O2 Device: None (Room air) (08/05/10 1307)      Physical Exam   Constitutional: He appears well-developed.   HENT:   Head: Normocephalic.   Cardiovascular: Normal rate, regular rhythm and normal heart sounds.  Pulmonary/Chest: Effort normal and breath sounds normal.   Abdominal: Soft. Bowel sounds are normal.       Lab Results:   All labs in the last 24 hours   Recent Results (from the past 24 hour(s))   HOLD GRAY       Component Value Range    Hold Grey HOLD TUBE     CBC    Collection Time    08/05/10  7:15 AM       Component Value Range    WBC 8.9  4.2 - 9.1 (THOU/uL)    RBC 4.7  4.6 - 6.1 (MIL/uL)    Hemoglobin 13.4 (*) 13.7 - 17.5 (g/dL)    Hematocrit 42  40 - 51 (%)    MCV 89  79 - 92 (fL)    RDW 16.4 (*) 11.6 - 14.4 (%)    Platelets 197  150 - 330 (THOU/uL)   PROTIME-INR    Collection Time    08/05/10  7:15 AM       Component Value Range    Protime 13.8  11.9 - 14.7 (sec)    INR 1.1  0.9 - 1.1    APTT    Collection Time    08/05/10  7:15 AM       Component Value Range    aPTT 36.6 (*) 22.3 - 35.3 (sec)   POTASSIUM    Collection Time    08/05/10  7:15 AM       Component Value Range    Potassium 5.2 (*) 3.3 - 5.1 (mmol/L)   CBC AND DIFFERENTIAL    Collection Time    08/05/10  9:11 AM       Component Value Range    WBC 8.9  4.2 - 9.1 (THOU/uL)    RBC 4.9  4.6 - 6.1 (MIL/uL)    Hemoglobin 14.0  13.7 - 17.5 (g/dL)    Hematocrit 43  40 - 51 (%)    MCV 89  79 - 92 (fL)    RDW  16.5 (*) 11.6 - 14.4 (%)    Platelets 205  150 - 330 (THOU/uL)    Seg Neut % 64.7  34.0 - 67.9 (%)    Lymphocyte % 21.5 (*) 21.8 - 53.1 (%)    Monocyte % 8.4  5.3 - 12.2 (%)    Eosinophil % 5.2  0.8 - 7.0 (%)    Basophil % 0.2  0.2 - 1.2 (%)    Neut # K/uL 5.8 (*) 1.8 - 5.4 (THOU/uL)    Lymph # K/uL 1.9  1.3 - 3.6 (THOU/uL)    Mono # K/uL 0.8  0.3 - 0.8 (THOU/uL)    Eos # K/uL 0.5  0.0 - 0.5 (THOU/uL)    Baso # K/uL 0.0  0.0 - 0.1 (THOU/uL)   BASIC METABOLIC PANEL    Collection Time    08/05/10  9:11 AM       Component Value Range    Glucose 81  74 - 106 (mg/dL)    Sodium 621  308 - 657 (mmol/L)    Potassium 5.5 (*) 3.3 - 5.1 (mmol/L)    Chloride 94 (*) 96 - 108 (mmol/L)    CO2 23  20 - 28 (mmol/L)    Anion Gap 18 (*) 7 - 16     UN 55 (*) 6 - 20 (mg/dL)    Creatinine 84.69 (*) 0.67 - 1.17 (mg/dL)    GFR,Caucasian 3 (*)     GFR,Black  4 (*)     Calcium 8.4 (*) 8.6 - 10.2 (mg/dL)   PROTIME-INR    Collection Time    08/05/10  9:11 AM       Component Value Range    Protime 13.4  11.9 - 14.7 (sec)    INR 1.0  0.9 - 1.1    APTT    Collection Time    08/05/10  9:11 AM       Component Value Range    aPTT 65.2 (*) 22.3 - 35.3 (sec)   HOLD GREEN WITH GEL    Collection Time    08/05/10  9:11 AM       Component Value Range    Hold Green (w/gel,spun) HOLD TUBE     BLOOD BANK HOLD LAVENDER    Collection Time    08/05/10  9:11 AM       Component Value Range    Bld Bank Hld Lav Lav In Bld Bank     BLOOD BANK HOLD RED    Collection Time    08/05/10  9:11 AM       Component Value Range    Bld Bank Hld Red Red In [facility]         Radiology impressions (last 3 days):  No results found.    Currently Active/Followed Hospital Problems:  There are no hospital problems to display for this patient.      Assessment: 50 year old male with left dialysis catheter and prior history of multiple episodes of malfunctioning catheter and fibrin sheath angioplasty, presents for malfunctioning dialysis catheter.    Plan: Left dialysis catheter check and change  and possible angioplasty of fibrin sheath    Author: Toma Copier, MD  Note created: 08/05/2010  at: 1:43 PM    Agree with the resident

## 2010-08-05 NOTE — Progress Notes (Signed)
 Nursing Procedure Note    Shane Pearson  [id number]    Procedure: hd cath change      Status: Completed    Patient tolerated procedure well     Procedure Dressing Site located:Left IJ  Dressing Type:sterile gauze/tegaderm  Biopatch:no  Dressing status:Clean, dry and intact   Hematoma:Not evident    Cardiovascular:  Peripheral Pulses: N/A   Fistula: N/A    Neuro Assessment:Patient is at pre-procedure baseline    Report given to:Unit Nurse. Unit dialysis Name alaina            Vanco 500 mg in bowl. Pt requested no sedation and tolerated procedure well without

## 2010-08-05 NOTE — ED Notes (Signed)
 Pt going to IR for replacement dialysis catheter, dialysis informed of procedure

## 2010-08-05 NOTE — ED Notes (Signed)
Pt is ambulatory in room 

## 2010-08-05 NOTE — ED Notes (Signed)
 Pt came from [facility], flow to dialysis cath is poor, sent  here for eval.denies pain to left cath chest area.dialysis cath was placed 6 weeks ago.pt alert and oriented x3.

## 2010-08-05 NOTE — ED Notes (Signed)
 Refused offer of food or snack.  States he wants to go home to eat.

## 2010-08-05 NOTE — ED Provider Notes (Addendum)
 History     Chief Complaint   Patient presents with   . Vascular Access Problem     HPI Comments: Shane Pearson is a 50 year old on HD (ESRD, no UOP, T, Th, Sat dialysis [facility]) who presents after being unable to access his HD cath for dialysis this morning.    At approximately 0600 the patient was at HD when they couldn't adequately access his left SC HD cath.  He reports that there has been some difficulty accessing this cath for the last 2 weeks, but they had always been able to access it.  They placed "Cath Flow" on Thursday, but the catheter was functioning even worse.    He was to have 4L taken off today.    He denies chest pain, dyspnea, edema of his extremities, abdominal pain.    Last PO intake yesterday at 2100.    The history is provided by the patient.       Past Medical History   Diagnosis Date   . ESRD (end stage renal disease)    . Dialysis patient    . Hypercholesterolemia        Past Surgical History   Procedure Date   . Right inguinal hernia repair        Family History   Problem Relation Age of Onset   . Diabetes Mother    . Asthma Mother    . Coronary art dis Father        Social History      reports that he quit smoking about 18 years ago. His smoking use included Cigarettes. He has a 4.5 pack-year smoking history. He does not have any smokeless tobacco history on file. He reports that he does not drink alcohol or use illicit drugs. His sexual activity history not on file.    Living Situation     Questions Responses    Patient lives with Spouse    Homeless     Caregiver for other family member     External Services     Employment Employed    Domestic Violence Risk           Review of Systems   Review of Systems   Constitutional: Negative for fever and chills.   HENT: Negative for sore throat, trouble swallowing, neck pain and neck stiffness.    Eyes: Negative for visual disturbance.   Respiratory: Negative for shortness of breath.    Cardiovascular: Negative for chest pain.      Gastrointestinal: Negative for nausea, vomiting and abdominal pain.   Genitourinary: Negative for flank pain.        Aneuric   Musculoskeletal: Negative for myalgias, joint swelling and arthralgias.   Skin: Negative for rash.   Neurological: Negative for syncope.   Hematological: Does not bruise/bleed easily.   Psychiatric/Behavioral: Negative for confusion and decreased concentration.       Physical Exam   BP 93/65  Pulse 71  Temp(Src) 36.2 C (97.2 F) (Temporal)  Resp 18  Ht 1.626 m (5\' 4" )  Wt 90.266 kg (199 lb)  BMI 34.16 kg/m2  SpO2 98%    Physical Exam   Nursing note and vitals reviewed.  Constitutional: He is oriented to person, place, and time. He appears well-developed and well-nourished.        The patient is seated, breathing easily, appears comfortable and cooperative.   HENT:   Head: Normocephalic and atraumatic.   Mouth/Throat: Oropharynx is clear and moist. No oropharyngeal  exudate.   Eyes: Conjunctivae are normal. No scleral icterus.   Neck: Normal range of motion. Neck supple.   Cardiovascular: Normal rate, regular rhythm and normal heart sounds.  Exam reveals no gallop and no friction rub.    No murmur heard.       HR 70-80  Radial pulses difficult to palpate, but hands are warm, well perfused.    2+ DP pulses.   Pulmonary/Chest: Effort normal and breath sounds normal. No respiratory distress. He has no wheezes. He has no rales.            No focal lung findings.  Mildly prolonged expiratory phase.   Abdominal: Soft. Bowel sounds are normal. He exhibits no distension. There is no tenderness. There is no rebound and no guarding.   Musculoskeletal: He exhibits no edema and no tenderness.   Neurological: He is alert and oriented to person, place, and time.   Skin: Skin is warm and dry. He is not diaphoretic.   Psychiatric: He has a normal mood and affect. His behavior is normal.       Medical Decision Making   MDM  Number of Diagnoses or Management Options  Acute hemodialysis patient:    Mechanical complications due to hemodialysis catheter:   Diagnosis management comments: Patient seen by me today, 08/05/2010 at 0843    Assessment:  50 y.o., male comes to the ED with inaccessible HD catheter in the setting of ESRD patient reliant on HD.  The patient appears to have had accelerating difficulty with his HD cath and it has finally stopped functioning today and it will need to be changed out.  We will obtain labs to evaluate the urgency of his HD needs, but per the nephrology attending who called him in it appears to be needed today.    Differential Diagnosis includes: HD cath problem, needing HD    Plan:   1. Call IR - spoke with IR consult on call.  They will discuss the case with Dr. Raleigh Callas who was notified by Dr. Arlyce Dice  2. Call Nephrology - Spoke with Dr. Nicholas Lose - he will evaluate the patient's HD needs and facilitate as needed.  3. NPO  4. Insert IV - CBC, BMP, INR/PTT, hold blood bank  5. EKG - eval hyper K.    Disposition to depend on above and clinical status.    Rachel Bo, MD - Pager [phone]  08/05/2010, 9:20 AM            LARS Erlinda Hong, MD    Darlina Guys, MD  08/05/10 [phone]    Patient seen by me on arrival date of 08/05/2010 at 0851    History:   I reviewed this patient, reviewed the resident note and agree  Exam:   I examined this patient, reviewed the resident note and agree    Decision Making:   I discussed with the documented resident decision making  and agree          Author Darlina Guys, MD    Darlina Guys, MD  08/07/10 [phone]

## 2010-08-05 NOTE — ED Notes (Signed)
 Bed:PA-10<BR> Expected date:08/05/10<BR> Expected time: 7:30 AM<BR> Means of arrival: Other<BR> Comments:<BR> ADULT CALL-IN...    Patient Name: Shane Pearson, Shane Pearson...    PCP/Service Referral: [phone] call from Dr. Alben Deeds, Nephrology, [phone]...    Patient Information Note: At [facility] but unable access site. Needs new dialysis cath, Dr. Raleigh Callas from IR is aware. Will need HD at [facility], has 4 liters of fluid on board, Heather,Charge Nurse, is aware...    Author Wetzel Bjornstad RN as of 08/05/18 12 at 7:30 AM

## 2010-08-09 LAB — EKG 12-LEAD
P: 52 degrees
QRS: 14 degrees
Rate: 70 {beats}/min
Severity: NORMAL
Severity: NORMAL
T: 21 degrees

## 2010-08-17 ENCOUNTER — Ambulatory Visit
Admit: 2010-08-17 | Discharge: 2010-08-17 | Disposition: A | Discharge status: Home / Self Care | Payer: Self-pay | Source: Ambulatory Visit | Admitting: Nephrology

## 2010-09-14 ENCOUNTER — Ambulatory Visit
Admit: 2010-09-14 | Discharge: 2010-09-14 | Disposition: A | Discharge status: Home / Self Care | Payer: Self-pay | Source: Ambulatory Visit | Admitting: Nephrology

## 2010-12-05 ENCOUNTER — Ambulatory Visit
Admit: 2010-12-05 | Discharge: 2010-12-05 | Disposition: A | Discharge status: Home / Self Care | Payer: Self-pay | Source: Ambulatory Visit | Attending: Nephrology | Admitting: Nephrology

## 2010-12-05 LAB — CBC AND DIFFERENTIAL
Baso # K/uL: 0 10*3/uL (ref 0.0–0.1)
Basophil %: 0.3 % (ref 0.2–1.2)
Eos # K/uL: 0.1 10*3/uL (ref 0.0–0.5)
Eosinophil %: 0.5 % — ABNORMAL LOW (ref 0.8–7.0)
Hematocrit: 44 % (ref 40–51)
Hemoglobin: 14.8 g/dL (ref 13.7–17.5)
Lymph # K/uL: 1.1 10*3/uL — ABNORMAL LOW (ref 1.3–3.6)
Lymphocyte %: 9.9 % — ABNORMAL LOW (ref 21.8–53.1)
MCV: 83 fL (ref 79–92)
Mono # K/uL: 1.3 10*3/uL — ABNORMAL HIGH (ref 0.3–0.8)
Monocyte %: 12.5 % — ABNORMAL HIGH (ref 5.3–12.2)
Neut # K/uL: 8.2 10*3/uL — ABNORMAL HIGH (ref 1.8–5.4)
Platelets: 238 10*3/uL (ref 150–330)
RBC: 5.3 MIL/uL (ref 4.6–6.1)
RDW: 19.1 % — ABNORMAL HIGH (ref 11.6–14.4)
Seg Neut %: 76.8 % — ABNORMAL HIGH (ref 34.0–67.9)
WBC: 10.6 10*3/uL — ABNORMAL HIGH (ref 4.2–9.1)

## 2011-02-21 ENCOUNTER — Encounter: Payer: Self-pay | Admitting: Family Medicine

## 2011-02-21 ENCOUNTER — Emergency Department
Admission: EM | Admit: 2011-02-21 | Disposition: A | Discharge status: Home / Self Care | Payer: Self-pay | Source: Ambulatory Visit

## 2011-02-21 LAB — CBC AND DIFFERENTIAL
Baso # K/uL: 0 10*3/uL (ref 0.0–0.1)
Basophil %: 0.1 % — ABNORMAL LOW (ref 0.2–1.2)
Eos # K/uL: 0.2 10*3/uL (ref 0.0–0.5)
Eosinophil %: 2.6 % (ref 0.8–7.0)
Hematocrit: 52 % — ABNORMAL HIGH (ref 40–51)
Hemoglobin: 17.2 g/dL (ref 13.7–17.5)
Lymph # K/uL: 1.5 10*3/uL (ref 1.3–3.6)
Lymphocyte %: 18.8 % — ABNORMAL LOW (ref 21.8–53.1)
MCV: 93 fL — ABNORMAL HIGH (ref 79–92)
Mono # K/uL: 0.6 10*3/uL (ref 0.3–0.8)
Monocyte %: 7.7 % (ref 5.3–12.2)
Neut # K/uL: 5.8 10*3/uL — ABNORMAL HIGH (ref 1.8–5.4)
Platelets: 266 10*3/uL (ref 150–330)
RBC: 5.6 MIL/uL (ref 4.6–6.1)
RDW: 18.5 % — ABNORMAL HIGH (ref 11.6–14.4)
Seg Neut %: 70.8 % — ABNORMAL HIGH (ref 34.0–67.9)
WBC: 8.2 10*3/uL (ref 4.2–9.1)

## 2011-02-21 LAB — APTT: aPTT: 37.7 s (ref 25.8–37.9)

## 2011-02-21 LAB — PROTIME-INR
INR: 1.1 (ref 1.0–1.2)
Protime: 10.7 s (ref 9.2–12.3)

## 2011-02-21 LAB — HOLD GREEN WITH GEL

## 2011-02-21 MED ORDER — VANCOMYCIN HCL IN DEXTROSE 5 MG/ML IV SOLN *I*
INTRAVENOUS | Status: AC | PRN
Start: 2011-02-21 — End: 2011-02-21
  Administered 2011-02-21: 500 mg via INTRAVENOUS

## 2011-02-21 MED ORDER — FENTANYL CITRATE 50 MCG/ML IJ SOLN *WRAPPED*
INTRAMUSCULAR | Status: AC
Start: 2011-02-21 — End: 2011-02-21
  Filled 2011-02-21: qty 2

## 2011-02-21 MED ORDER — FENTANYL CITRATE 50 MCG/ML IJ SOLN *WRAPPED*
INTRAMUSCULAR | Status: AC | PRN
Start: 2011-02-21 — End: 2011-02-21
  Administered 2011-02-21: 50 ug via INTRAVENOUS

## 2011-02-21 MED ORDER — VANCOMYCIN HCL IN DEXTROSE 500 MG/100ML IV SOLN *I*
INTRAVENOUS | Status: AC
Start: 2011-02-21 — End: 2011-02-21
  Filled 2011-02-21: qty 200

## 2011-02-21 MED ORDER — MIDAZOLAM HCL 1 MG/ML IJ SOLN *I* WRAPPED
INTRAMUSCULAR | Status: AC | PRN
Start: 2011-02-21 — End: 2011-02-21
  Administered 2011-02-21: 1 mg via INTRAVENOUS

## 2011-02-21 MED ORDER — MIDAZOLAM HCL 1 MG/ML IJ SOLN *I* WRAPPED
INTRAMUSCULAR | Status: AC
Start: 2011-02-21 — End: 2011-02-21
  Filled 2011-02-21: qty 5

## 2011-02-21 NOTE — Discharge Instructions (Signed)
Continue your medications as prescribed   Attend your routine dialysis tomorrow, as scheduled  Follow-up with your PCP and nephrologist as needed.  Return to the ED with any symptoms or further catheter problems

## 2011-04-26 ENCOUNTER — Encounter: Payer: Self-pay | Admitting: General Practice

## 2011-04-26 ENCOUNTER — Other Ambulatory Visit: Payer: Self-pay | Admitting: Nurse Practitioner

## 2011-04-26 ENCOUNTER — Telehealth: Payer: Self-pay

## 2011-04-26 ENCOUNTER — Ambulatory Visit
Admit: 2011-04-26 | Discharge: 2011-04-26 | Disposition: A | Discharge status: Home / Self Care | Payer: Self-pay | Source: Ambulatory Visit | Attending: Nephrology | Admitting: Nephrology

## 2011-04-26 ENCOUNTER — Telehealth: Payer: Self-pay | Admitting: General Practice

## 2011-04-26 LAB — PROTIME-INR
INR: 1 (ref 1.0–1.2)
Protime: 10.4 s (ref 9.2–12.3)

## 2011-04-26 NOTE — Telephone Encounter (Signed)
Pt not available for phone confirmation, left message for call back to [facility] to confirm appointment 04/27/2011.

## 2011-04-26 NOTE — Telephone Encounter (Signed)
Left message on answering machine, NPO X 6 hours, meds with sips of water, needs a driver

## 2011-04-27 ENCOUNTER — Ambulatory Visit
Admit: 2011-04-27 | Discharge: 2011-04-27 | Disposition: A | Discharge status: Home / Self Care | Payer: Self-pay | Source: Ambulatory Visit | Attending: Nephrology | Admitting: Nephrology

## 2011-04-27 ENCOUNTER — Encounter: Payer: Self-pay | Admitting: Nephrology

## 2011-04-27 HISTORY — DX: Hypotension, unspecified: I95.9

## 2011-04-27 MED ORDER — HEPARIN SODIUM (PORCINE) 5000 UNIT/ML IJ SOLN *I*
INTRAMUSCULAR | Status: AC | PRN
Start: 2011-04-27 — End: 2011-04-27
  Administered 2011-04-27: 2.1 mL

## 2011-04-27 MED ORDER — HEPARIN SODIUM (PORCINE) 5000 UNIT/ML IJ SOLN *I*
INTRAMUSCULAR | Status: AC
Start: 2011-04-27 — End: 2011-04-27
  Filled 2011-04-27: qty 10

## 2011-04-27 NOTE — Progress Notes (Signed)
VENOUS PORT OF DIALYSIS CATHETER PORT REPAIRED BY DR. Janne Napoleon.  2.1 ML OF 5000 UNIT/ML HEPARIN DWELLED IN BLUE PORT.  SITE REDRESSED WITH BIOPATCH.  PATIENT INSTRUCTED TO INFORM DIALYSIS RN'S THAT 2.1 ML OF HEPARIN SHOULD BE DWELLED IN BLUE PORT OF CATHETER AFTER DIALYSIS TREATMENT IS COMPLETE.  RED PORT IS 1.6 ML.  PATIENT HAS GOOD UNDERSTANDING AND ALSO HAS WRITTEN INSTRUCTION.

## 2011-04-27 NOTE — Procedures (Signed)
Procedure Report    Blue hub of dialysis catheter was replaced with a new Hub using a repair kit. Instruction were send to dialysis and referring providers to prime the lumen with 2.1 mL of heparin.

## 2011-04-27 NOTE — Discharge Instructions (Signed)
IR Post Procedure Discharge Instructions  04/27/2011  4:49 PM    Physician performing test/procedure: Dr. Elvia Collum    Your test or procedure was fistula repair    Instructions: as follows    Diet: Resume your previous diet    Activity:     Dressing: A pressure dressing/bandage was applied to the procedure site. Remove dressing or bandage the morning after your procedure. and If bleeding occurs at procedure site, apply firm pressure to the site for 10 minutes. If bleeding persists, apply pressure to site and notify your physician immediately.    Medications: resume previous medications. If you are taking warfarin (Coumadin), check with your doctor as to when to resume taking this medication.    Call your physician IF: You notice signs of infection at procedure site: redness, foul smelling drainage, increased swelling, fever, chills, bleeding, or severe pain at procedure site.        I have read and understand the above instructions and I have received a copy.    ________________________________________________________________Patient Signature:     Date:    ________________________________________________________________  Nurse Signature:     Date:        During working hours (Monday - Friday 8am-5pm), you may call [phone] [facility]) or [phone] [facility]). After 5pm and on weekends, if you have any questions regarding the procedure, please call [phone] to speak witht he [facility] nurse or [phone] and ask to speak with the Radiology resident on call.  the Emergency Department is open 24 hours a day at [facility] and [place] if emergency treatment is required.

## 2012-05-24 DIAGNOSIS — J309 Allergic rhinitis, unspecified: Secondary | ICD-10-CM | POA: Insufficient documentation

## 2012-08-01 ENCOUNTER — Encounter: Payer: Self-pay | Admitting: Gastroenterology

## 2012-08-13 ENCOUNTER — Telehealth: Payer: Self-pay

## 2012-08-13 NOTE — Telephone Encounter (Signed)
Office or direct?

## 2012-08-13 NOTE — Telephone Encounter (Signed)
Schedule EUS

## 2012-08-13 NOTE — Telephone Encounter (Signed)
Please call Mae at Dr. Holly Bodily office to schedule EUS w/ Dr. Janora Norlander for the pt, per 5/23 Media referral at [phone]

## 2012-08-14 NOTE — Telephone Encounter (Signed)
Called pt and left message

## 2012-08-15 ENCOUNTER — Encounter: Payer: Self-pay | Admitting: Gastroenterology

## 2012-08-15 NOTE — Telephone Encounter (Signed)
Called pt and left message .... Mailed letter

## 2012-08-29 ENCOUNTER — Encounter: Payer: Self-pay | Admitting: Gastroenterology

## 2012-08-29 NOTE — Telephone Encounter (Signed)
Faxed ... Pt has been scheduled/notified for 8.1

## 2012-08-29 NOTE — Telephone Encounter (Signed)
Mae from Dr. Holly Bodily office called requesting that the letter that was sent out to the patient be faxed to her at [phone].

## 2012-10-08 ENCOUNTER — Telehealth: Payer: Self-pay | Admitting: Gastroenterology

## 2012-10-08 NOTE — Telephone Encounter (Signed)
Pt has been rescheduled/notified to 8.6

## 2012-10-08 NOTE — Telephone Encounter (Signed)
Patient calling because he states he got a phone call stating his 8.1 EUS was canceled and rescheduled to 8.25. There is no documentation of this and he is still scheduled for 8.1. Please call him back at [phone] to verify.

## 2012-10-10 ENCOUNTER — Encounter: Payer: Self-pay | Admitting: Gastroenterology

## 2012-10-15 ENCOUNTER — Encounter: Payer: Self-pay | Admitting: Gastroenterology

## 2012-10-15 ENCOUNTER — Ambulatory Visit
Admit: 2012-10-15 | Discharge: 2012-10-15 | Disposition: A | Discharge status: Home / Self Care | Payer: Self-pay | Attending: Gastroenterology | Admitting: Gastroenterology

## 2012-10-15 HISTORY — DX: Polyp of colon: K63.5

## 2012-10-15 HISTORY — DX: Disorder of thyroid, unspecified: E07.9

## 2012-10-15 MED ORDER — FENTANYL CITRATE 50 MCG/ML IJ SOLN *WRAPPED*
INTRAMUSCULAR | Status: AC | PRN
Start: 2012-10-15 — End: 2012-10-15
  Administered 2012-10-15 (×3): 25 ug via INTRAVENOUS

## 2012-10-15 MED ORDER — SODIUM CHLORIDE 0.9 % IV SOLN WRAPPED *I*
100.0000 mL/h | Status: DC
Start: 2012-10-15 — End: 2012-10-16
  Administered 2012-10-15: 100 mL/h via INTRAVENOUS

## 2012-10-15 MED ORDER — MIDAZOLAM HCL 1 MG/ML IJ SOLN *I* WRAPPED
INTRAMUSCULAR | Status: AC | PRN
Start: 2012-10-15 — End: 2012-10-15
  Administered 2012-10-15 (×2): 1 mg via INTRAVENOUS
  Administered 2012-10-15 (×4): 2 mg via INTRAVENOUS

## 2012-10-15 MED ORDER — MIDAZOLAM HCL 5 MG/5ML IJ SOLN *I*
INTRAMUSCULAR | Status: AC
Start: 2012-10-15 — End: 2012-10-15
  Filled 2012-10-15: qty 15

## 2012-10-15 MED ORDER — FENTANYL CITRATE 50 MCG/ML IJ SOLN *WRAPPED*
INTRAMUSCULAR | Status: AC
Start: 2012-10-15 — End: 2012-10-15
  Filled 2012-10-15: qty 4

## 2012-10-15 NOTE — Procedures (Signed)
Procedure Report  Endoscopic Ultrasound Procedure Note   Date of Procedure: 10/15/2012   Referring Physician: Royston Cowper   Primary Care Provider: Royston Cowper   Attending Physician: Olga Coaster, MD  Fellow:None  Indication: Duodenal poly        Preprocedure Evaluation: The patient was stable for sedation and endoscopy. An informed consent was obtained prior to the procedure, including risks and benefits of dilation, control of hemorrhage and tissue removal and sampling/drainage by fine needle aspiration.     Medications: Fentanyl 75 mcg IV and Midazolam 10 mg IV  were administered incrementally over the course of the procedure to achieve an adequate level of conscious sedation to meet patient and procedural needs.    Endoscope: Olympus radial echoendoscope and EGD scope      Procedure Details: The patient was placed in the left lateral position and monitored continuously with automatic blood pressure monitoring, pulse oximetry, and direct observations. Bite block placed and oxygen given via nasal cannula. An endoscope was inserted through the orophanynx; and positioned in the GI tract .  The ultrasound examination was performed as outlined in the findings below.         Findings:   EGD showed a bilobed polyp in the duodenal bulb. On EUS examination the polyp was localized to the mucosa. The 2 lobes measured 7.8 x 11.2 mm and 7.2 x 6.7 mm.    The polyp was raised with normal saline and then removed. The specimen was retrieved.         Complications: none  Impression:   1. Duodenal polyp localized to the mucosa.  2. Polyp removed after raising with normal saline   Recommendations:   Check the results of biopsies.  Follow with Dr. Dolores Frame

## 2012-10-15 NOTE — Discharge Instructions (Signed)
GASTROENTEROLOGY UNIT  DISCHARGE INSTRUCTIONS FOR GASTROSCOPY      10/15/2012                2:16 PM    EGD and polyp removed     Do not drive, operate heavy machinery, drink alcoholic beverages, make important personal or business decisions, or sign legal documents until the next day.    Return to your usual medications   Return to your usual diet    Things You May Expect:   A mild sore throat (Warm liquids or lozenges will soothe the throat)   You were given medication to help you relax during the test. You need to rest at home for at least 4-6 hours.    You Should Call Your Doctor For Any of the Following:   Fever, abdominal or chest pain that does not go away   Cough or trouble breathing   Bloody or black stools.   Pain or redness at the IV site    If you have a serious problem after hours.  Call [phone] to reach the GI physician on call.    If you are unable to reach your doctor, go to the [facility] Emergency Department.    Follow Up Care:   Follow up with Dr. Dolores Frame    New Prescriptions    No medications on file

## 2012-10-15 NOTE — Preop H&P (Addendum)
OUTPATIENT PRE-PROCEDURE H&P    Chief Complaint / Indications for Procedure: duodenal polyp  Past Medical History:     Past Medical History   Diagnosis Date   . ESRD (end stage renal disease)    . Dialysis patient    . Hypercholesterolemia    . Hypotension    . Colon polyp    . Thyroid disease      hypo     Past Surgical History   Procedure Laterality Date   . Right inguinal hernia repair       Family History   Problem Relation Age of Onset   . Diabetes Mother    . Asthma Mother    . Coronary art dis Father      History     Social History   . Marital Status: Married     Spouse Name: N/A     Number of Children: N/A   . Years of Education: N/A     Social History Main Topics   . Smoking status: Former Smoker -- 0.30 packs/day for 15 years     Types: Cigarettes     Quit date: 08/02/1992   . Smokeless tobacco: None   . Alcohol Use: No   . Drug Use: No   . Sexually Active: None     Other Topics Concern   . None     Social History Narrative   . None       Allergies:    Allergies   Allergen Reactions   . Coconut Flavor Nausea And Vomiting   . Amoxicillin Rash   . No Known Latex Allergy        Medications:  Current Outpatient Prescriptions   Medication   . pantoprazole (PROTONIX) 40 MG EC tablet   . aspirin 81 MG EC tablet   . Omega-3 Fatty Acids (FISH OIL) 1200 MG CPDR   . Polyethylene Glycol 3350 (MIRALAX PO)   . sevelamer (RENAGEL) 800 MG tablet   . midodrine (PROAMATINE) 10 MG tablet   . fluticasone (FLONASE) 50 MCG/ACT nasal spray   . atorvastatin (LIPITOR) 10 MG tablet   . fludrocortisone (FLORINEF) 0.1 mg tablet   . b complex-vitamin c-folic acid (NEPHRO-VITE) tablet   . calcium-vitamin D (OSCAL-500) 500-200 MG-UNIT per tablet   . cinacalcet (SENSIPAR) 30 MG tablet   . omeprazole (PRILOSEC) 20 MG capsule   . docusate sodium (COLACE) 100 MG capsule   . minocycline (MINOCIN) 100 MG capsule   . diflunisal (DOLOBID) 500 MG TABS     Current Facility-Administered Medications   Medication Dose Route Frequency   . sodium  chloride 0.9 % IV  100 mL/hr Intravenous Continuous      Filed Vitals:    10/15/12 1252   BP: 89/53   Pulse: 70   Temp: 36.4 C (97.5 F)   Resp: 16   Height: 167.6 cm (5\' 6" )   Weight: 88.7 kg (195 lb 8.8 oz)           Physical Examination:  Head/Nose/Throat:negative  Lungs:Lungs clear  Cardiovascular:normal S1 and S2  Abdomen: abdomen soft, non-tender, nondistended, normal active bowel sounds, no masses or organomegaly      Lab Results: none    Radiology impressions (last 30 days):  No results found.    Currently Active Problems:  There is no problem list on file for this patient.         UPDATES TO PATIENT'S CONDITION on the DAY OF SURGERY/PROCEDURE    I. Updates  to Patient's Condition (to be completed by a provider privileged to complete a H&P, following reassessment of the patient by the provider):    Full H&P done today; no updates needed.    II. Procedure Readiness   I have reviewed the patient's H&P and updated condition. By completing and signing this form, I attest that this patient is ready for surgery/procedure.      III. Attestation   I have reviewed the updated information regarding the patient's condition and it is appropriate to proceed with the planned surgery/procedure.    Lidya Mccalister Janora Norlander, MD as of 1:06 PM 10/15/2012

## 2012-10-17 LAB — SURGICAL PATHOLOGY

## 2013-01-08 DIAGNOSIS — I70209 Unspecified atherosclerosis of native arteries of extremities, unspecified extremity: Secondary | ICD-10-CM | POA: Insufficient documentation

## 2013-09-01 ENCOUNTER — Other Ambulatory Visit: Payer: Self-pay | Admitting: Nephrology

## 2013-09-04 ENCOUNTER — Telehealth: Payer: Self-pay

## 2013-09-04 NOTE — Telephone Encounter (Signed)
Pre procedure call made to Hadley Pen     Allergies   Allergen Reactions    Coconut Flavor Nausea And Vomiting    Amoxicillin Rash    No Known Latex Allergy        If patient has a known contrast allergy appropriate radiology provider notified:   []  yes []  no [x]  N/A    Patient/Family Anesthetic Sensitivity: []  Yes [x]  No    Does the patient have a history of sleep apnea?   []  Yes-pt instructed to bring C-pap or Bi-pap machine   [x]  No     Patient instructed to:   Take all of your medications with a sip of water.   Hold herbal medications and supplements   Hold anti-coagulation medication per SIR guidelines []  Yes []  No [x]  NA   To take half dose of insulin on the morning of the procedure []  Yes [] No [] NA    NPO Instructions for SEDATED procedures:   No milk products/solid foods 6 hours before procedure   No breast milk 4 hours before procedure   No clear liquids 2 hours before procedure    Transportation has been arranged for patient Post-Procedure: [x]  Yes []  No -friend    Additional patient instructions:   Please leave personal belongings and valuables at home     Please do not bring children     Please arrive at (time) 12;30 and anticipate that you will be here for 2.0 hours.   Patient given instructions on how to find the radiology department.  Labs:  IR Lab Values From Results Review Latest Ref Rng 04/26/2011   HCT 40 - 51 % -   HGB 13.7 - 17.5 g/dL -   PLT 150 - 330 THOU/uL -   INR 1.0 - 1.2 1.0   K 3.3 - 5.1 mmol/L -   NA 133 - 145 mmol/L -   UN 6 - 20 mg/dL -   CREAT 0.67 - 1.17 mg/dL -   GFRB - -   GFRC - -      If no labs available patient asked if they have had labs drawn outside the [facility] system in the last 30 days: No      [] If no current labs, instructed patient to arrive 30 minutes prior to original arrival time.

## 2013-09-07 ENCOUNTER — Encounter: Payer: Self-pay | Admitting: Nephrology

## 2013-09-07 ENCOUNTER — Ambulatory Visit
Admit: 2013-09-07 | Discharge: 2013-09-07 | Disposition: A | Discharge status: Home / Self Care | Payer: Self-pay | Source: Ambulatory Visit | Attending: Nephrology | Admitting: Nephrology

## 2013-09-07 MED ORDER — VANCOMYCIN HCL IN DEXTROSE 5 MG/ML IV SOLN *I*
INTRAVENOUS | Status: AC | PRN
Start: 2013-09-07 — End: 2013-09-07
  Administered 2013-09-07: 500 mg via INTRAVENOUS

## 2013-09-07 MED ORDER — FENTANYL CITRATE 50 MCG/ML IJ SOLN *WRAPPED*
INTRAMUSCULAR | Status: AC | PRN
Start: 2013-09-07 — End: 2013-09-07
  Administered 2013-09-07: 25 ug via INTRAVENOUS

## 2013-09-07 MED ORDER — HEPARIN SODIUM (PORCINE) 1000 UNIT/ML IJ SOLN *WRAPPED*
Status: DC | PRN
Start: 2013-09-07 — End: 2013-09-08
  Administered 2013-09-07: 1.9 mL via INTRAVENOUS

## 2013-09-07 MED ORDER — IOHEXOL 240 MG/ML (OMNIPAQUE) IV SOLN *I*
INTRAMUSCULAR | Status: DC | PRN
Start: 2013-09-07 — End: 2013-09-08
  Administered 2013-09-07: 10 mL via INTRAVENOUS

## 2013-09-07 NOTE — Discharge Instructions (Signed)
Tunneled Central Line Discharge Instructions   09/07/2013  2:00 PM    Provider performing test/procedure: Dr. Lyman Bishop    Patient received medication? Yes. You have been given medicine, Fentanyl, that may make you sleepy. Do not drive, operate  heavy machinery, drink alcoholic beverages, make important personal or business decisions, or sign legal documents until the next day.     You must be with a responsible adult the first night after your test.   You can eat your usual diet.   Rest for the remainder of the day. Return to normal activities in 24 hours.   You may not shower or get the dressing wet.   Do not remove the bandage from the catheter site. If bleeding occurs at the catheter site, apply firm pressure to the site for 10 minutes. If the bandage is soaked with blood, apply pressure and call your doctor immediately.   Resume your usual medications.   If you take warfarin (Coumadin), check with your primary doctor before restarting this medicine.   Do not take aspirin (including Anacin), Ibuprofen (Advil,Motrin,Nuprin) or naproxen sodium (Aleve) for the next 24 to 48 hours unless you directed by your primary doctor.   Acetaminophen (Tylenol) is the drug of choice if you have pain.     Call Your Doctor IF:   You have signs of infection at the catheter site: fever, chills, severe pain or swelling, foul smelling drainage.   If your catheter gets pulled out.      Monday - Friday 8am-5pm: For questions or concerns regarding your procedure please call [facility] ([facility] [phone].    After hours/Weekends:  [facility] patients may call [phone] and ask to  speak with the Radiology resident on call.   [facility] patients may call [phone] to speak with the [place] or call [phone] and ask to speak with the radiology resident on call.    The Emergency Department is open 24 hours a day at [address] if emergency treatment is required.

## 2013-09-07 NOTE — Preop H&P (Signed)
OUTPATIENT  Chief Complaint: malfunction dialysis catheter     History of Present Illness:  HPI    Past Medical History   Diagnosis Date    ESRD (end stage renal disease)     Dialysis patient     Hypercholesterolemia     Hypotension     Colon polyp     Thyroid disease      hypo     Past Surgical History   Procedure Laterality Date    Right inguinal hernia repair       Family History   Problem Relation Age of Onset    Diabetes Mother     Asthma Mother     Coronary art dis Father      History     Social History    Marital Status: Married     Spouse Name: N/A     Number of Children: N/A    Years of Education: N/A     Social History Main Topics    Smoking status: Former Smoker -- 0.30 packs/day for 15 years     Types: Cigarettes     Quit date: 08/02/1992    Smokeless tobacco: None    Alcohol Use: No    Drug Use: No    Sexual Activity: None     Other Topics Concern    None     Social History Narrative       Allergies:   Allergies   Allergen Reactions    Coconut Flavor Nausea And Vomiting    Amoxicillin Rash    No Known Latex Allergy        Current Outpatient Prescriptions   Medication    aspirin 81 MG EC tablet    Polyethylene Glycol 3350 (MIRALAX PO)    sevelamer (RENAGEL) 800 MG tablet    midodrine (PROAMATINE) 10 MG tablet    fluticasone (FLONASE) 50 MCG/ACT nasal spray    atorvastatin (LIPITOR) 10 MG tablet    fludrocortisone (FLORINEF) 0.1 mg tablet    b complex-vitamin c-folic acid (NEPHRO-VITE) tablet    calcium-vitamin D (OSCAL-500) 500-200 MG-UNIT per tablet    cinacalcet (SENSIPAR) 30 MG tablet    omeprazole (PRILOSEC) 20 MG capsule    docusate sodium (COLACE) 100 MG capsule    diflunisal (DOLOBID) 500 MG TABS    pantoprazole (PROTONIX) 40 MG EC tablet    Omega-3 Fatty Acids (FISH OIL) 1200 MG CPDR    minocycline (MINOCIN) 100 MG capsule     Current Facility-Administered Medications   Medication Dose Route Frequency    Vancomycin (VANCOCIN) IV   Intravenous  Code/Trauma/Sedation Medication        Review of Systems:   ROS    Last Nursing documented pain:  0-10 Scale: 0 (09/07/13 1250)      Patient Vitals for the past 24 hrs:   BP Temp Temp src Pulse Resp SpO2 Height Weight   09/07/13 1250 84/57 mmHg 36.2 C (97.2 F) TEMPORAL 67 16 95 % 162.6 cm (5\' 4" ) 88.451 kg (195 lb)     O2 Device: None (Room air) (09/07/13 1250)      Physical Exam    Lab Results:   none    Radiology impressions (last 3 days):  No results found.    Currently Active/Followed Hospital Problems:  There are no hospital problems to display for this patient.          Plan: change catheter over a wire    Author: Lyman Bishop, MD  Note created:  09/07/2013  at: 1:25 PM

## 2013-09-07 NOTE — Procedures (Signed)
Procedure Report    Tunneled dialysis catheter was changed over a wire with 14.5 F Palindrome catheter  Fibrin sheat was cleaned using 10 mm balloon

## 2013-09-07 NOTE — Progress Notes (Signed)
[facility] Nursing Procedure Note    Ankith Issa  [id number]    Procedure: replace dialysis catheter over wire        Status: Completed    Patient tolerated procedure well     Procedure Dressing Site located:left IJ  Dressing Type:sterile gauze/tegaderm and suture  Biopatch:no  Dressing status:Clean, dry and intact   Hematoma:Not evident  Medication received:Fentanyl 23mcg  Cardiovascular:   Peripheral Pulses: Present post procedure      Fistula: N/A    Neuro Assessment:Patient is at pre-procedure baseline    Report given [id number] recovery nurse. Name: Lenna Sciara      Last Filed Vitals    09/07/13 1346   BP: 90/57   Pulse: 64   Temp:    Resp: 14   SpO2: 96%     O2 Device: Nasal cannula

## 2013-09-07 NOTE — Progress Notes (Signed)
Here to have dialysis catheter changed over a wire. Has been having problems on the machine with high pressures. Patient to be known to have very low blood pressure. Feels fine, has problems if he moves to fast or gets up to fast. Had a full dialysis treatment on Saturday 09/05/2013 @ [facility]

## 2013-11-07 ENCOUNTER — Encounter (HOSPITAL_COMMUNITY): Payer: Self-pay | Admitting: Emergency Medicine

## 2013-11-07 ENCOUNTER — Inpatient Hospital Stay (HOSPITAL_COMMUNITY)
Admission: EM | Admit: 2013-11-07 | Discharge: 2013-11-07 | DRG: 314 | Disposition: A | Discharge status: Home / Self Care | Payer: Medicare Other | Attending: Nephrology | Admitting: Nephrology

## 2013-11-07 ENCOUNTER — Emergency Department (HOSPITAL_COMMUNITY): Payer: Medicare Other

## 2013-11-07 DIAGNOSIS — M129 Arthropathy, unspecified: Secondary | ICD-10-CM | POA: Diagnosis present

## 2013-11-07 DIAGNOSIS — Z7982 Long term (current) use of aspirin: Secondary | ICD-10-CM | POA: Diagnosis not present

## 2013-11-07 DIAGNOSIS — I9589 Other hypotension: Secondary | ICD-10-CM | POA: Diagnosis present

## 2013-11-07 DIAGNOSIS — N2581 Secondary hyperparathyroidism of renal origin: Secondary | ICD-10-CM | POA: Diagnosis present

## 2013-11-07 DIAGNOSIS — Y849 Medical procedure, unspecified as the cause of abnormal reaction of the patient, or of later complication, without mention of misadventure at the time of the procedure: Secondary | ICD-10-CM | POA: Diagnosis present

## 2013-11-07 DIAGNOSIS — T82598A Other mechanical complication of other cardiac and vascular devices and implants, initial encounter: Secondary | ICD-10-CM | POA: Diagnosis present

## 2013-11-07 DIAGNOSIS — N186 End stage renal disease: Secondary | ICD-10-CM | POA: Diagnosis present

## 2013-11-07 DIAGNOSIS — Z992 Dependence on renal dialysis: Secondary | ICD-10-CM | POA: Diagnosis not present

## 2013-11-07 DIAGNOSIS — I120 Hypertensive chronic kidney disease with stage 5 chronic kidney disease or end stage renal disease: Secondary | ICD-10-CM | POA: Diagnosis present

## 2013-11-07 DIAGNOSIS — M948X9 Other specified disorders of cartilage, unspecified sites: Secondary | ICD-10-CM | POA: Diagnosis present

## 2013-11-07 DIAGNOSIS — T8241XA Breakdown (mechanical) of vascular dialysis catheter, initial encounter: Secondary | ICD-10-CM | POA: Diagnosis present

## 2013-11-07 DIAGNOSIS — D649 Anemia, unspecified: Secondary | ICD-10-CM | POA: Diagnosis present

## 2013-11-07 HISTORY — DX: Disorder of kidney and ureter, unspecified: N28.9

## 2013-11-07 HISTORY — DX: Unspecified osteoarthritis, unspecified site: M19.90

## 2013-11-07 LAB — HEPATITIS B SURFACE ANTIGEN: Hepatitis B Surface Ag: NEGATIVE

## 2013-11-07 MED ORDER — HEPARIN SODIUM (PORCINE) 1000 UNIT/ML DIALYSIS
2000.0000 [IU] | INTRAMUSCULAR | Status: DC | PRN
  Filled 2013-11-07: qty 2

## 2013-11-07 MED ORDER — LIDOCAINE HCL (PF) 1 % IJ SOLN: 5.0000 mL | INTRAMUSCULAR | Status: DC | PRN

## 2013-11-07 MED ORDER — HEPARIN SODIUM (PORCINE) 1000 UNIT/ML DIALYSIS
1000.0000 [IU] | INTRAMUSCULAR | Status: DC | PRN
  Filled 2013-11-07: qty 1

## 2013-11-07 MED ORDER — ACETAMINOPHEN 325 MG PO TABS: 650.0000 mg | ORAL_TABLET | Freq: Four times a day (QID) | ORAL | Status: DC | PRN

## 2013-11-07 MED ORDER — ACETAMINOPHEN 650 MG RE SUPP: 650.0000 mg | Freq: Four times a day (QID) | RECTAL | Status: DC | PRN

## 2013-11-07 MED ORDER — SODIUM CHLORIDE 0.9 % IV SOLN: 100.0000 mL | INTRAVENOUS | Status: DC | PRN

## 2013-11-07 MED ORDER — MIDODRINE HCL 5 MG PO TABS
ORAL_TABLET | ORAL | Status: AC
  Administered 2013-11-07: 10 mg
  Filled 2013-11-07: qty 2

## 2013-11-07 MED ORDER — LIDOCAINE-PRILOCAINE 2.5-2.5 % EX CREA: 1.0000 "application " | TOPICAL_CREAM | CUTANEOUS | Status: DC | PRN

## 2013-11-07 MED ORDER — PENTAFLUOROPROP-TETRAFLUOROETH EX AERO: 1.0000 "application " | INHALATION_SPRAY | CUTANEOUS | Status: DC | PRN

## 2013-11-07 MED ORDER — ALTEPLASE 2 MG IJ SOLR
2.0000 mg | Freq: Once | INTRAMUSCULAR | Status: DC | PRN
  Filled 2013-11-07: qty 2

## 2013-11-07 MED ORDER — NEPRO/CARBSTEADY PO LIQD: 237.0000 mL | ORAL | Status: DC | PRN

## 2013-11-07 NOTE — ED Notes (Signed)
Pt sts here from [place] for funeral and had dialysis on Wednesday before leaving and was a center today and here for eval for possibly having catheter pulled out a small amount; pt denies pain or obvious incidence of it being pulled on

## 2013-11-07 NOTE — Progress Notes (Signed)
Patient stated that he needs to leave tonight because he has to drive back to [place] because he and his wife have to be at work Monday morning.  Per Cristy Friedlander, RN hemodialysis catheter did fine.  Spoke with Dr. Arlean Hopping, ok to discharge patient tonight and he will do discharge summary in am. Patient to sign out AMA if Epic will not discharge patient.  Patient signed AMA paper.

## 2013-11-07 NOTE — ED Notes (Signed)
Patient transported to X-ray 

## 2013-11-07 NOTE — Plan of Care (Signed)
Problem: Phase I Progression Outcomes Goal: Pain controlled with appropriate interventions Outcome: Not Applicable Date Met:  38/88/75 Pt denies pain. Goal: Up in chair Outcome: Completed/Met Date Met:  11/07/13 Pt ambulating in room without difficulty.  Goal: Initial discharge plan identified Outcome: Completed/Met Date Met:  11/07/13 Discharge home following successful hemodialysis with working hemodialysis catheter.  Goal: Dyspnea controlled at rest Outcome: Completed/Met Date Met:  11/07/13 No dyspnea.

## 2013-11-07 NOTE — Consult Note (Signed)
[facility] Renal Consultation Note  Indication for Consultation:  Management of ESRD/hemodialysis; anemia, hypertension/volume and secondary hyperparathyroidism  HPI: Christopher Hancock is a 53 y.o. male with ESRD on dialysis for 15 years, now on Tuesdays, Thursdays, and Saturdays in [place], [place], who was in [place] to attend a funeral on Thursday and had one outpatient treatment previously scheduled at the [facility], but presented with his right IJ catheter dislodged and was deemed unsafe to dialyze.   He was seen in the ED, and the catheter Dacron cuff was slightly exposed, whereas his chest x-ray showed the catheter tips well positioned in the right atrium.  He will be admitted for inpatient dialysis, since he plans to return to [facility] tomorrow, and he will be evaluated by VVS to determine whether to replace the catheter here or secure it for replacement in [place].  He takes Midodrine for chronic hypotension and reports that his blood pressure was too low to sustain a permanent access and that his systolic pressure may regularly run as low as 70s during dialysis  Dialysis Orders:   TTS @ [facility] in [place] 4:30     84 kg    2K/3.5Ca     400/600     L IJ catheter      Heparin 5200 U  Hectorol 2 mcg         No Aranesp or Venofer  Past Medical History  Diagnosis Date  . Renal disorder   . Arthritis    History reviewed. No pertinent past surgical history. History reviewed. No pertinent family history.  Social History He denies any history of tobacco, alcohol, or illicit drug use.  He currently lives with his wife in [place], [place], but has family in [place].   Allergies  Allergen Reactions  . Penicillins    Prior to Admission medications   Not on File   Labs: No results found for this or any previous visit (from the past 48 hour(s)).  Constitutional: negative for chills, fatigue, fevers and sweats Ears, nose, mouth, throat, and face:  negative for earaches, hoarseness, nasal congestion and sore throat Respiratory: negative for cough, dyspnea on exertion, hemoptysis and sputum Cardiovascular: negative for chest pain, chest pressure/discomfort, dyspnea, orthopnea and palpitations Gastrointestinal: negative for abdominal pain, change in bowel habits, nausea and vomiting Genitourinary:negative, oliguric Musculoskeletal:negative for arthralgias, back pain, myalgias and neck pain Neurological: negative for dizziness, gait problems, headaches, paresthesia and weakness  Physical Exam: Filed Vitals:   11/07/13 1430  BP: 95/63  Pulse: 66  Temp:   Resp:      General appearance: alert, cooperative and no distress Head: Normocephalic, without obvious abnormality, atraumatic Neck: no adenopathy, no carotid bruit, no JVD and supple, symmetrical, trachea midline Resp: clear to auscultation bilaterally Cardio: regular rate and rhythm, S1, S2 normal, no murmur, click, rub or gallop Gastrointestinal:  Positive bowel sounds, soft and nontender Extremities: extremities normal, atraumatic, no cyanosis or edema Neurologic: Grossly normal Dialysis Access: L IJ catheter   Assessment/Plan: 1. Dislodged dialysis catheter - Dacron cuff exposure of ~1 cm, but well positioned in R atrium per X-ray; VVS consulted. 2. ESRD - HD on TTS in [place], [place]; last HD 8/26.  HD pending. 3. Hypotension/volume - BP 95/63 on Midodrine 10 mg tid; 3.7 L over EDW, may allow minimal SBP in 70s. 4. Anemia - no Aranesp or IV Fe. 5. Metabolic bone disease - Last corrected Ca 8, P 4.6, iPTH 259; Hectorol 2 mcg, Sensipar 30 mg qd, Renagel  1 with meals, Tums 2 bid. 6. Nutrition - Last Alb 4.4, renal diet, multivitamin.  LYLES,CHARLES 11/07/2013, 3:18 PM   Attending Nephrologist: Delano Metz, MD  Pt seen, examined and agree w A/P as above. The patient's catheter is minimally dislodged with the Dacron cuff 2/3 exposed which means the whole catheter has moved  about an inch or less, and this is confirmed on the CXR with the cath tip in the right atrium/ SVC confluence.  We will do regular dialysis and patient has been given the option after dialysis to remain her to the have the catheter stitched back in or removed in the morning, or the alternative of going home and back to his hometown to have the catheter replaced up there.  He has chronic low BP on midodrine and goal is 3.7 kg today.   Vinson Moselle MD pager [phone]    cell [phone] 11/07/2013, 3:22 PM

## 2013-11-08 NOTE — Discharge Summary (Signed)
Physician Discharge Summary  Patient ID: Christopher Hancock MRN: [id number] DOB/AGE: [birth year] 53 y.o.  Admit date: 11/07/2013 Discharge date: 11/08/2013  Admission Diagnoses: Principal Problem:   Partially extruded tunneled hemodialysis catheter Active Problems:   ESRD on hemodialysis   Hypotension, chronic   Discharge Diagnoses:  Principal Problem:   Partially extruded tunneled hemodialysis catheter Active Problems:   ESRD on hemodialysis   Hypotension, chronic   Discharged Condition: good  Hospital Course: 1. HD catheter partially extruded - the cath functioned well and HD was completed. The CXR showed the catheter tip to be in the R atrium where is it supposed to be.  The patient decided not to stay here to have the catheter pulled out or stitched back in but to be dc'd and return to his hometown to deal with the issue.  His dialysis was uneventful.   Consults: None  Significant Diagnostic Studies:   Treatments: dialysis: Hemodialysis  Discharge Exam: Blood pressure 94/62, pulse 70, temperature 98 F (36.7 C), temperature source Oral, resp. rate 18, height  (1.676 m), weight 82.5 kg (181 lb 14.1 oz), SpO2 98.00%. General appearance: alert, cooperative and no distress  Head: Normocephalic, without obvious abnormality, atraumatic  Neck: no adenopathy, no carotid bruit, no JVD and supple, symmetrical, trachea midline  Resp: clear to auscultation bilaterally  Cardio: regular rate and rhythm, S1, S2 normal, no murmur, click, rub or gallop  Gastrointestinal: Positive bowel sounds, soft and nontender  Extremities: extremities normal, atraumatic, no cyanosis or edema  Neurologic: Grossly normal  Dialysis Access: L IJ catheter   Disposition: 01-Home or Self Care     Medication List    ASK your doctor about these medications       aspirin EC 81 MG tablet  Take 81 mg by mouth at bedtime.     atorvastatin 10 MG tablet  Commonly known as:  LIPITOR  Take 10 mg by mouth  daily.     b complex-vitamin c-folic acid 0.8 MG Tabs tablet  Take 1 tablet by mouth daily.     bisacodyl 5 MG EC tablet  Commonly known as:  DULCOLAX  Take 5 mg by mouth at bedtime.     CALCIUM 600 + D PO  Take 2 tablets by mouth 2 (two) times daily.     cinacalcet 60 MG tablet  Commonly known as:  SENSIPAR  Take 60 mg by mouth at bedtime.     diflunisal 500 MG Tabs tablet  Commonly known as:  DOLOBID  Take 500 mg by mouth 2 (two) times daily.     fludrocortisone 0.1 MG tablet  Commonly known as:  FLORINEF  Take 0.1 mg by mouth 3 (three) times daily.     fluticasone 50 MCG/ACT nasal spray  Commonly known as:  FLONASE  Place 2 sprays into both nostrils daily.     midodrine 10 MG tablet  Commonly known as:  PROAMATINE  Take 10 mg by mouth 3 (three) times daily.     pantoprazole 40 MG tablet  Commonly known as:  PROTONIX  Take 40 mg by mouth daily.     sevelamer carbonate 800 MG tablet  Commonly known as:  RENVELA  Take 1,600 mg by mouth 3 (three) times daily with meals.     VISINE 0.05 % ophthalmic solution  Generic drug:  tetrahydrozoline  Place 2 drops into both eyes 2 (two) times daily.         Signed: Sami Froh D 11/08/2013, 7:04 AM

## 2013-11-08 NOTE — ED Provider Notes (Signed)
CSN: [id number]     Arrival date & time 11/07/13  1327 History   First MD Initiated Contact with Patient 11/07/13 1347     Chief Complaint  Patient presents with  . Vascular Access Problem      HPI  Patient presents with a request for evaluation of his dialysis catheter. He lives in [place]. He and his family traveled for a funeral. He made arrangements for an outpatient hemodialysis today. Upon being evaluated there his catheter showed exposure of his Dacron cuff. He was sent to the emergency room for evaluation the catheter prior to hemodialysis. His last dialysis was 48 hours ago. He is asymptomatic  Past Medical History  Diagnosis Date  . Renal disorder   . Arthritis    History reviewed. No pertinent past surgical history. History reviewed. No pertinent family history. History  Substance Use Topics  . Smoking status: Never Smoker   . Smokeless tobacco: Not on file  . Alcohol Use: No    Review of Systems  Constitutional: Negative for fever, chills, diaphoresis, appetite change and fatigue.  HENT: Negative for mouth sores, sore throat and trouble swallowing.   Eyes: Negative for visual disturbance.  Respiratory: Negative for cough, chest tightness, shortness of breath and wheezing.   Cardiovascular: Negative for chest pain.  Gastrointestinal: Negative for nausea, vomiting, abdominal pain, diarrhea and abdominal distention.  Endocrine: Negative for polydipsia, polyphagia and polyuria.  Genitourinary: Negative for dysuria, frequency and hematuria.  Musculoskeletal: Negative for gait problem.  Skin: Negative for color change, pallor and rash.  Neurological: Negative for dizziness, syncope, light-headedness and headaches.  Hematological: Does not bruise/bleed easily.  Psychiatric/Behavioral: Negative for behavioral problems and confusion.      Allergies  Penicillins and Amoxicillin  Home Medications   Prior to Admission medications   Medication Sig Start Date End  Date Taking? Authorizing Provider  aspirin EC 81 MG tablet Take 81 mg by mouth at bedtime.   Yes Historical Provider, MD  atorvastatin (LIPITOR) 10 MG tablet Take 10 mg by mouth daily.   Yes Historical Provider, MD  b complex-vitamin c-folic acid (NEPHRO-VITE) 0.8 MG TABS tablet Take 1 tablet by mouth daily.   Yes Historical Provider, MD  bisacodyl (DULCOLAX) 5 MG EC tablet Take 5 mg by mouth at bedtime.   Yes Historical Provider, MD  Calcium Carb-Cholecalciferol (CALCIUM 600 + D PO) Take 2 tablets by mouth 2 (two) times daily.   Yes Historical Provider, MD  cinacalcet (SENSIPAR) 60 MG tablet Take 60 mg by mouth at bedtime.   Yes Historical Provider, MD  diflunisal (DOLOBID) 500 MG TABS tablet Take 500 mg by mouth 2 (two) times daily.   Yes Historical Provider, MD  fludrocortisone (FLORINEF) 0.1 MG tablet Take 0.1 mg by mouth 3 (three) times daily.   Yes Historical Provider, MD  fluticasone (FLONASE) 50 MCG/ACT nasal spray Place 2 sprays into both nostrils daily.   Yes Historical Provider, MD  midodrine (PROAMATINE) 10 MG tablet Take 10 mg by mouth 3 (three) times daily.   Yes Historical Provider, MD  pantoprazole (PROTONIX) 40 MG tablet Take 40 mg by mouth daily.   Yes Historical Provider, MD  sevelamer carbonate (RENVELA) 800 MG tablet Take 1,600 mg by mouth 3 (three) times daily with meals.   Yes Historical Provider, MD  tetrahydrozoline (VISINE) 0.05 % ophthalmic solution Place 2 drops into both eyes 2 (two) times daily.   Yes Historical Provider, MD   BP 94/62  Pulse 70  Temp(Src) 98  F (36.7 C) (Oral)  Resp 18  Ht  (1.676 m)  Wt 181 lb 14.1 oz (82.5 kg)  BMI 29.37 kg/m2  SpO2 98% Physical Exam  Constitutional: He is oriented to person, place, and time. He appears well-developed and well-nourished. No distress.  HENT:  Head: Normocephalic.  Eyes: Conjunctivae are normal. Pupils are equal, round, and reactive to light. No scleral icterus.  Neck: Normal range of motion. Neck  supple. No thyromegaly present.  Cardiovascular: Normal rate and regular rhythm.  Exam reveals no gallop and no friction rub.   No murmur heard. Pulmonary/Chest: Effort normal and breath sounds normal. No respiratory distress. He has no wheezes. He has no rales.  Clear lungs.  Abdominal: Soft. Bowel sounds are normal. He exhibits no distension. There is no tenderness. There is no rebound.  Musculoskeletal: Normal range of motion.  Neurological: He is alert and oriented to person, place, and time.  Skin: Skin is warm and dry. No rash noted.  Psychiatric: He has a normal mood and affect. His behavior is normal.    ED Course  Procedures (including critical care time) Labs Review Labs Reviewed  HEPATITIS B SURFACE ANTIGEN    Imaging Review Dg Chest 1 View  11/07/2013   CLINICAL DATA:  Vascular access problem.  EXAM: CHEST - 1 VIEW  COMPARISON:  None.  FINDINGS: Left dialysis catheter is in place with the tips in the right atrium. Heart is normal size. Lungs are clear. No effusions or acute bony abnormality.  IMPRESSION: No active disease.   Electronically Signed   By: Charlett Nose M.D.   On: 11/07/2013 15:11     EKG Interpretation None      MDM   Final diagnoses:  None    Patient's subclavian Quinton catheter shows exposure of Dacron cuff.  However, the x-ray of the tip is still in the right atrium. I placed a call to and received a call from Dr.Shertz.   Plan is to admit the patient for hemodialysis tonight.    Rolland Porter, MD 11/08/13 [phone]

## 2013-11-10 ENCOUNTER — Telehealth: Payer: Self-pay

## 2013-11-10 ENCOUNTER — Other Ambulatory Visit: Payer: Self-pay | Admitting: Nephrology

## 2013-11-10 NOTE — Telephone Encounter (Signed)
Pre procedure call made to Shane Pearson     Allergies   Allergen Reactions    Penicillins Swelling     Face    Coconut Flavor Nausea And Vomiting    Amoxicillin Rash    No Known Latex Allergy        If patient has a known contrast allergy appropriate radiology provider notified:   []  yes []  no [x]  N/A    Patient/Family Anesthetic Sensitivity: []  Yes [x]  No    Does the patient have a history of sleep apnea?   []  Yes-pt instructed to bring C-pap or Bi-pap machine   [x]  No     Patient instructed to:   Take all of your medications with a sip of water.   Hold herbal medications and supplements   Hold anti-coagulation medication per SIR guidelines []  Yes []  No [x]  NA   To take half dose of insulin on the morning of the procedure []  Yes [] No [] NA    NPO Instructions for SEDATED procedures:   No milk products/solid foods 6 hours before procedure   No breast milk 4 hours before procedure   No clear liquids 2 hours before procedure    Transportation has been arranged for patient Post-Procedure: [x]  Yes []  No -Wife-Cheryl  Additional patient instructions:   Please leave personal belongings and valuables at home     Please do not bring children     Please arrive at (time) 11:00 and anticipate that you will be here for 2.5 hours.   Patient given instructions on how to find the radiology department.  Labs:  IR Lab Values From Results Review Latest Ref Rng 04/26/2011   HCT 40 - 51 % -   HGB 13.7 - 17.5 g/dL -   PLT 150 - 330 THOU/uL -   INR 1.0 - 1.2 1.0   K 3.3 - 5.1 mmol/L -   NA 133 - 145 mmol/L -   UN 6 - 20 mg/dL -   CREAT 0.67 - 1.17 mg/dL -   GFRB - -   GFRC - -      If no labs available patient asked if they have had labs drawn outside the [facility] system in the last 30 days: No      [] If no current labs, instructed patient to arrive 30 minutes prior to original arrival time.

## 2013-11-10 NOTE — Telephone Encounter (Signed)
Left message on answering machine with time, date and location of appointment. Reminder to be NPO X 6 hours, medication with sips of water and needs transportation. Return call phone number given.

## 2013-11-11 ENCOUNTER — Ambulatory Visit
Admit: 2013-11-11 | Discharge: 2013-11-11 | Disposition: A | Discharge status: Home / Self Care | Payer: Self-pay | Source: Ambulatory Visit | Attending: Nephrology | Admitting: Nephrology

## 2013-11-11 ENCOUNTER — Encounter: Payer: Self-pay | Admitting: Nephrology

## 2013-11-11 MED ORDER — VANCOMYCIN HCL IN DEXTROSE 5 MG/ML IV SOLN *I*
INTRAVENOUS | Status: AC | PRN
Start: 2013-11-11 — End: 2013-11-11
  Administered 2013-11-11: 500 mg via INTRAVENOUS

## 2013-11-11 NOTE — Progress Notes (Signed)
Here to have dialysis catheter changed over a wire. Per staff cuff is showing of line. Dressing is dry and intact.

## 2013-11-11 NOTE — Preop H&P (Signed)
OUTPATIENT  Chief Complaint: dialysis catheter is pulled out    History of Present Illness:  HPI    Past Medical History   Diagnosis Date    ESRD (end stage renal disease)     Dialysis patient     Hypercholesterolemia     Hypotension     Colon polyp     Thyroid disease      hypo     Past Surgical History   Procedure Laterality Date    Right inguinal hernia repair       Family History   Problem Relation Age of Onset    Diabetes Mother     Asthma Mother     Coronary art dis Father      History     Social History    Marital Status: Married     Spouse Name: N/A     Number of Children: N/A    Years of Education: N/A     Social History Main Topics    Smoking status: Former Smoker -- 0.30 packs/day for 15 years     Types: Cigarettes     Quit date: 08/02/1992    Smokeless tobacco: Not on file    Alcohol Use: No    Drug Use: No    Sexual Activity: Not on file     Other Topics Concern    Not on file     Social History Narrative       Allergies:   Allergies   Allergen Reactions    Penicillins Swelling     Face    Coconut Flavor Nausea And Vomiting    Amoxicillin Rash    No Known Latex Allergy        Current Outpatient Prescriptions   Medication    pantoprazole (PROTONIX) 40 MG EC tablet    aspirin 81 MG EC tablet    Omega-3 Fatty Acids (FISH OIL) 1200 MG CPDR    Polyethylene Glycol 3350 (MIRALAX PO)    sevelamer (RENAGEL) 800 MG tablet    midodrine (PROAMATINE) 10 MG tablet    fluticasone (FLONASE) 50 MCG/ACT nasal spray    atorvastatin (LIPITOR) 10 MG tablet    fludrocortisone (FLORINEF) 0.1 mg tablet    b complex-vitamin c-folic acid (NEPHRO-VITE) tablet    calcium-vitamin D (OSCAL-500) 500-200 MG-UNIT per tablet    cinacalcet (SENSIPAR) 30 MG tablet    omeprazole (PRILOSEC) 20 MG capsule    docusate sodium (COLACE) 100 MG capsule    minocycline (MINOCIN) 100 MG capsule    diflunisal (DOLOBID) 500 MG TABS     No current facility-administered medications for this encounter.        Review  of Systems:   ROS    Last Nursing documented pain:        No data found.           Physical Exam    Lab Results:   none    Radiology impressions (last 3 days):  No results found.    Currently Active/Followed Hospital Problems:  There are no hospital problems to display for this patient.          Plan: change dialysis catheter    Author: Lyman Bishop, MD  Note created: 11/11/2013  at: 11:10 AM

## 2013-11-11 NOTE — Progress Notes (Signed)
[facility] Nursing Procedure Note    Shane Pearson  [id number]    Procedure: change dialysis catheter over wire        Status: Completed    Patient tolerated procedure well     Procedure Dressing Site located:Left IJ  Dressing Type:sterile gauze/Hypafix and suture  Biopatch:no  Dressing status:Clean, dry and intact   Hematoma:Not evident  Medication received:None  Cardiovascular:   Peripheral Pulses: Present post procedure      Fistula: N/A    Neuro Assessment:Patient is at pre-procedure baseline    Report given [id number] recovery nurse. Name: Lenna Sciara      Last Filed Vitals    11/11/13 1201   BP: 93/60   Pulse: 61   Temp:    Resp: 17   SpO2: 99%     O2 Device: Nasal cannula

## 2013-11-11 NOTE — Discharge Instructions (Signed)
Tunneled Central Line Discharge Instructions   11/11/2013  12:50 PM    Provider performing test/procedure: Dr. Lyman Bishop    Patient received medication? No     You must be with a responsible adult the first night after your test.   You can eat your usual diet.   Rest for the remainder of the day. Return to normal activities in 24 hours.   You may not shower or get the dressing wet.   Do not remove the bandage from the catheter site. If bleeding occurs at the catheter site, apply firm pressure to the site for 10 minutes. If the bandage is soaked with blood, apply pressure and call your doctor immediately.   Resume your usual medications.   If you take warfarin (Coumadin), check with your primary doctor before restarting this medicine.   Do not take aspirin (including Anacin), Ibuprofen (Advil,Motrin,Nuprin) or naproxen sodium (Aleve) for the next 24 to 48 hours unless you directed by your primary doctor.   Acetaminophen (Tylenol) is the drug of choice if you have pain.     Call Your Doctor IF:   You have signs of infection at the catheter site: fever, chills, severe pain or swelling, foul smelling drainage.   If your catheter gets pulled out.      Monday - Friday 8am-5pm: For questions or concerns regarding your procedure please call [facility] ([facility] [phone].    After hours/Weekends:  [facility] patients may call [phone] and ask to  speak with the Radiology resident on call.   [facility] patients may call [phone] to speak with the [facility] or call [phone] and ask to speak with the radiology resident on call.    The Emergency Department is open 24 hours a day at [facility] if emergency treatment is required.

## 2013-11-11 NOTE — Procedures (Signed)
Procedure Report  Tunneled dialysis catheter was changed over a wire with 14.5 F Palindrome catheter

## 2013-12-17 ENCOUNTER — Encounter: Payer: Self-pay | Admitting: Nephrology

## 2013-12-17 ENCOUNTER — Ambulatory Visit
Admit: 2013-12-17 | Discharge: 2013-12-17 | Disposition: A | Discharge status: Home / Self Care | Payer: Self-pay | Source: Ambulatory Visit | Attending: Nephrology | Admitting: Nephrology

## 2013-12-17 ENCOUNTER — Other Ambulatory Visit: Payer: Self-pay | Admitting: Nephrology

## 2013-12-17 ENCOUNTER — Telehealth: Payer: Self-pay

## 2013-12-17 MED ORDER — VANCOMYCIN HCL IN DEXTROSE 5 MG/ML IV SOLN *I*
INTRAVENOUS | Status: AC | PRN
Start: 2013-12-17 — End: 2013-12-17
  Administered 2013-12-17: 500 mg via INTRAVENOUS

## 2013-12-17 NOTE — Progress Notes (Signed)
[facility] Nursing Procedure Note    Shane Pearson  [id number]    Procedure: change catheter over wire        Status: Completed    Patient tolerated procedure well     Procedure Dressing Site located: chest  Dressing Type:sterile gauze/tegaderm  Biopatch:no  Dressing status:Clean, dry and intact   Hematoma:Not evident  Medication received:None  Cardiovascular:   Peripheral Pulses: N/A      Fistula: N/A    Neuro Assessment:N/A    Report given [id number] recovery nurse. Name: T. Abdikadir Fohl RN      Last Filed Vitals    12/17/13 1433   BP: 99/55   Pulse: 78   Temp:    Resp: 16   SpO2: 100%     O2 Device: Nasal cannula

## 2013-12-17 NOTE — Progress Notes (Signed)
While at dialysis today it was noticed that catheter cuff was showing. DSD intact. Here to have catheter changed over wire

## 2013-12-17 NOTE — Interval H&P Note (Signed)
UPDATES TO PATIENT'S CONDITION on the DAY OF SURGERY/PROCEDURE    I. Updates to Patient's Condition (to be completed by a provider privileged to complete a H&P, following reassessment of the patient by the provider):    Full H&P done today; no updates needed.            II. Procedure Readiness   I have reviewed the patient's H&P and updated condition. By completing and signing this form, I attest that this patient is ready for surgery/procedure.      III. Attestation   I have reviewed the updated information regarding the patient's condition and it is appropriate to proceed with the planned surgery/procedure.    Jahaad Penado Jennet Maduro, MD as of 2:01 PM 12/17/2013

## 2013-12-17 NOTE — Progress Notes (Signed)
Patient concerned over red raised areas up right arm where Vancomycin was instilling. Dr. Truman Hayward aware and into see patient. Dr. Truman Hayward agreed that area looks like a reaction of the Vancomycin. Area itching red raised on fore arm. Denies SOB or trouble breathing. Pt would prefer to go home and take Benadryl. He drove himself. Dr. Truman Hayward agrees with plan.. Pt questions about putting Vanco on allergy list, needs to be discussed, if antibiotic needed and it needs to be on list for this discussion to happen

## 2013-12-17 NOTE — Preop H&P (Signed)
OUTPATIENT  Chief Complaint: TCL pulled back    History of Present Illness:53 yo man with a history of a left TCL and ESRD with his cuff pulled back. He presents for reinsertion.  HPI    Past Medical History   Diagnosis Date    ESRD (end stage renal disease)     Dialysis patient     Hypercholesterolemia     Hypotension     Colon polyp     Thyroid disease      hypo     Past Surgical History   Procedure Laterality Date    Right inguinal hernia repair       Family History   Problem Relation Age of Onset    Diabetes Mother     Asthma Mother     Coronary art dis Father      History     Social History    Marital Status: Married     Spouse Name: N/A     Number of Children: N/A    Years of Education: N/A     Social History Main Topics    Smoking status: Former Smoker -- 0.30 packs/day for 15 years     Types: Cigarettes     Quit date: 08/02/1992    Smokeless tobacco: None    Alcohol Use: No    Drug Use: No    Sexual Activity: None     Other Topics Concern    None     Social History Narrative       Allergies:   Allergies   Allergen Reactions    Penicillins Swelling     Face    Coconut Flavor Nausea And Vomiting    Amoxicillin Rash    No Known Latex Allergy        Current Outpatient Prescriptions   Medication    aspirin 81 MG EC tablet    sevelamer (RENAGEL) 800 MG tablet    midodrine (PROAMATINE) 10 MG tablet    fluticasone (FLONASE) 50 MCG/ACT nasal spray    atorvastatin (LIPITOR) 10 MG tablet    fludrocortisone (FLORINEF) 0.1 mg tablet    b complex-vitamin c-folic acid (NEPHRO-VITE) tablet    calcium-vitamin D (OSCAL-500) 500-200 MG-UNIT per tablet    cinacalcet (SENSIPAR) 30 MG tablet    omeprazole (PRILOSEC) 20 MG capsule    docusate sodium (COLACE) 100 MG capsule    diflunisal (DOLOBID) 500 MG TABS    pantoprazole (PROTONIX) 40 MG EC tablet    Omega-3 Fatty Acids (FISH OIL) 1200 MG CPDR    Polyethylene Glycol 3350 (MIRALAX PO)    minocycline (MINOCIN) 100 MG capsule     No current  facility-administered medications for this encounter.        Review of Systems:   ROS    Last Nursing documented pain:  0-10 Scale: 0 (12/17/13 1335)      Patient Vitals for the past 24 hrs:   BP Temp Temp src Pulse Resp SpO2 Height Weight   12/17/13 1335 (!) 79/55 mmHg 36.3 C (97.3 F) TEMPORAL 90 16 98 % 167.6 cm (5\' 6" ) 82.8 kg (182 lb 8.7 oz)     O2 Device: None (Room air) (12/17/13 1335)      Physical Exam   Heart: RRR  Lungs: CTA  Abdomen: benign    Lab Results:   All labs in the last 72 hours:No results found for this or any previous visit (from the past 72 hour(s)).    Radiology impressions (last 3  days):  No results found.    Currently Active/Followed Hospital Problems:  There are no hospital problems to display for this patient.      Assessment: TCL pulled back    Plan: TCL exchange    Author: Chapman Moss, MD  Note created: 12/17/2013  at: 1:58 PM

## 2013-12-17 NOTE — Discharge Instructions (Signed)
Tunneled [facility] Line Discharge Instructions   12/17/2013  2:03 PM    Provider performing test/procedure: Dr. Mart Piggs    Patient received medication? No     You must be with a responsible adult the first night after your test.   You can eat your usual diet.   Rest for the remainder of the day. Return to normal activities in 24 hours.   You may not shower or get the dressing wet.   Do not remove the bandage from the catheter site. If bleeding occurs at the catheter site, apply firm pressure to the site for 10 minutes. If the bandage is soaked with blood, apply pressure and call your doctor immediately.   Resume your usual medications.   If you take warfarin (Coumadin), check with your primary doctor before restarting this medicine.   Do not take aspirin (including Anacin), Ibuprofen (Advil,Motrin,Nuprin) or naproxen sodium (Aleve) for the next 24 to 48 hours unless you directed by your primary doctor.   Acetaminophen (Tylenol) is the drug of choice if you have pain.     Call Your Doctor IF:   You have signs of infection at the catheter site: fever, chills, severe pain or swelling, foul smelling drainage.   If your catheter gets pulled out.      Monday - Friday 8am-5pm: For questions or concerns regarding your procedure please call [facility] ([facility] [phone].    After hours/Weekends:  [facility] patients may call [phone] and ask to  speak with the Radiology resident on call.   [facility] patients may call [phone] to speak with the [place] or call [phone] and ask to speak with the radiology resident on call.    The Emergency Department is open 24 hours a day at [facility] if emergency treatment is required.

## 2013-12-17 NOTE — Telephone Encounter (Signed)
12/17/13 16:30  F/U call completed at this time for possible allergic reaction to IV Vancomycin.  Patient stated that he was feeling fine and did not have any  symptoms of respiratory distress.  He stated that the hives resolved by themselves and that he did not need to take any Benadryl.  He was advised to call back with any further symptoms or concerns.  Terrace Arabia. Roderic Lammert, RN

## 2013-12-17 NOTE — Procedures (Signed)
Procedure Report    Please enter procedure, pre- and and post-procedure diagnoses in fields above and removed this line of text.    PROCEDURE NOTE    Time out documentation completed in Procedure navigator prior to procedure:  Yes    Indications  Cuff came out    Procedure Details  The existing catheter was exchanged for a new 14.43F/19cm TCL     Findings  See report    Complications  none    Condition  good    Plan/Orders  Follow up as needed    EBL: minimal    Specimens  no    Disposition  Sent to recovery in good condition    Sonnie Bias E Lilliemae Fruge, MD  12/17/2013  2:37 PM

## 2013-12-17 NOTE — H&P (View-Only) (Signed)
OUTPATIENT  Chief Complaint: TCL pulled back    History of Present Illness:53 yo man with a history of a left TCL and ESRD with his cuff pulled back. He presents for reinsertion.  HPI    Past Medical History   Diagnosis Date    ESRD (end stage renal disease)     Dialysis patient     Hypercholesterolemia     Hypotension     Colon polyp     Thyroid disease      hypo     Past Surgical History   Procedure Laterality Date    Right inguinal hernia repair       Family History   Problem Relation Age of Onset    Diabetes Mother     Asthma Mother     Coronary art dis Father      History     Social History    Marital Status: Married     Spouse Name: N/A     Number of Children: N/A    Years of Education: N/A     Social History Main Topics    Smoking status: Former Smoker -- 0.30 packs/day for 15 years     Types: Cigarettes     Quit date: 08/02/1992    Smokeless tobacco: None    Alcohol Use: No    Drug Use: No    Sexual Activity: None     Other Topics Concern    None     Social History Narrative       Allergies:   Allergies   Allergen Reactions    Penicillins Swelling     Face    Coconut Flavor Nausea And Vomiting    Amoxicillin Rash    No Known Latex Allergy        Current Outpatient Prescriptions   Medication    aspirin 81 MG EC tablet    sevelamer (RENAGEL) 800 MG tablet    midodrine (PROAMATINE) 10 MG tablet    fluticasone (FLONASE) 50 MCG/ACT nasal spray    atorvastatin (LIPITOR) 10 MG tablet    fludrocortisone (FLORINEF) 0.1 mg tablet    b complex-vitamin c-folic acid (NEPHRO-VITE) tablet    calcium-vitamin D (OSCAL-500) 500-200 MG-UNIT per tablet    cinacalcet (SENSIPAR) 30 MG tablet    omeprazole (PRILOSEC) 20 MG capsule    docusate sodium (COLACE) 100 MG capsule    diflunisal (DOLOBID) 500 MG TABS    pantoprazole (PROTONIX) 40 MG EC tablet    Omega-3 Fatty Acids (FISH OIL) 1200 MG CPDR    Polyethylene Glycol 3350 (MIRALAX PO)    minocycline (MINOCIN) 100 MG capsule     No current  facility-administered medications for this encounter.        Review of Systems:   ROS    Last Nursing documented pain:  0-10 Scale: 0 (12/17/13 1335)      Patient Vitals for the past 24 hrs:   BP Temp Temp src Pulse Resp SpO2 Height Weight   12/17/13 1335 (!) 79/55 mmHg 36.3 C (97.3 F) TEMPORAL 90 16 98 % 167.6 cm (5\' 6" ) 82.8 kg (182 lb 8.7 oz)     O2 Device: None (Room air) (12/17/13 1335)      Physical Exam   Heart: RRR  Lungs: CTA  Abdomen: benign    Lab Results:   All labs in the last 72 hours:No results found for this or any previous visit (from the past 72 hour(s)).    Radiology impressions (last 3  days):  No results found.    Currently Active/Followed Hospital Problems:  There are no hospital problems to display for this patient.      Assessment: TCL pulled back    Plan: TCL exchange    Author: Chapman Moss, MD  Note created: 12/17/2013  at: 1:58 PM

## 2014-02-23 ENCOUNTER — Telehealth: Payer: Self-pay | Admitting: Gastroenterology

## 2014-02-23 NOTE — Telephone Encounter (Signed)
[place] with Dr. Rosina Lowenstein office calling to request 10/15/12 EUS report is faxed to [phone]. Please call [phone] to discuss, if necessary.

## 2014-02-23 NOTE — Telephone Encounter (Signed)
Faxed

## 2014-04-07 ENCOUNTER — Encounter: Payer: Self-pay | Admitting: Colon and Rectal Surgery

## 2014-04-07 NOTE — H&P (Signed)
[facility]  Clayton Lefort, M.D.     Richrd Humbles, M.D.     Fransisca Kaufmann, M.D.  Drucie Ip, M.D.     Harrietta Guardian, M.D.     Thornton Park, M.D.       Dalphine Handing, M.D.        Colorectal Surgery Consultation    CC: ischemic colitis, strictures    HPI: Referred by Dr. Comer Locket.  He was seen in our practice by Dr. Davina Poke back in 2010 for an anal fissure.  In July of last year he was hospitalized at [facility] for acute ischemic colitis of the ascending colon.  This was confirmed on colonoscopy.  He was treated with bowel rest and abx.  Repeat colonoscopy in October revealed an ascending colon stricture with a healing ulcer.  The scope was not passed through the stricture.  He also had a mild descending colon stricture.    Barium enema last month was normal.     He has abdominal pain daily, which lasts only a few minutes.  He was on Miralax for many years previously but not anymore as his stools are small and soft.  He does take Dulcolax QOD.  No blood in the stool but occasionally in the toilet "when my stools are hard."  He has a good appetite and is on a regular renal diet.    He has ESRD and is on hemodialysis.  He sees [facility] nephrology.  He has chronic low BP, in the 80's.  During last summer's illness he cannot recall any specific events which might have caused a further drop in his BP.    PMH:  Medical Problems:  Hypercholesterolemia  Congestive Heart Failure (CHF) - many years ago - sees a cardiologist yearly  Hypotension, Thyroid Disease, Gastroesophageal Reflux Disease (GERD), Ulcers  Sleep Apnea - past  Colon Polyps, Anal Fissure, Polyp Of Duodenum  Chronic Kidney Disease - renal failure for 15 years - on dialysis T/Th/Sat  Peritonitis  Ischemic Colitis - July 2015    Surgical Hx:  Inguinal Hernia Repair  Av Fistula Placement - 3 times  Peritoneal Dialysis Catheter  Reviewed and updated.    Medications: Calcium 600  600 mg  Midodrine HCL  10 mg  Nephro-Vite    Sensipar  30  mg  Fludrocortisone Acetate  0.1 mg  Omeprazole  40 mg  Atorvastatin Calcium  80 mg  Renvela  800 mg  Diflunisal  500 mg  Dulcolax  5 mg  Bayer Aspirin Ec Low Dose  81 mg   Allergies:   Amoxicillin - rash, No Allergy To Latex, Coconut - emesis, Penicillin - throat and facial swelling     SH:Occupation: Optometrist. Married, 2 children  Personal Habits: Former cigarette smoker. Smoked 1 pack a week for 3 years. Quit 1995 Does not currently consume alcohol. Does not currently use illegal drugs.  Reviewed and updated.    FH:  Negative For Polyps, Colon Cancer, Rectal Cancer, Endometrial Cancer, Ovarian Cancer, Stomach Cancer (Gastric), Thyroid Cancer.  Reviewed, no changes.    ROS:  Const: Denies symptoms other than stated above.  Eyes: Denies symptoms other than stated above.  ENMT: Denies ear symptoms other than stated above.  CV: Denies symptoms other than stated above.  Resp: Denies symptoms other than stated above.  GI: Denies symptoms other than stated above.  GU: Denies symptoms other than stated above.  Musculo: Denies symptoms other than stated above.  Neuro: Denies symptoms other than  stated above.  Psych: Denies symptoms other than stated above.  Endocrine: Denies symptoms other than stated above.  Hema/Lymph: Denies hematologic/lymphatic symptoms other than stated above.  Allergy/Immuno: Denies symptoms other than stated above.    PHYSICAL EXAMINATION  BP: 80/60 Pulse: 68 T: 98.3 Resp: 16 Ht: 65" Wt: 186lb 5oz Wt kg: 84.5 BMI: 31.0 Pain Level: 0    Exam:  Const: No signs of acute distress present.  Eyes: Conjunctivae clear. Sclerae are anicteric.  ENMT: Oropharynx: Appears normal.  Neck: Trachea midline.  Resp: Inspection of chest reveals airway is patent and airway is clear. Respirations are regular. Lungs are clear bilaterally.  CV: Rate is regular. Rhythm is regular. No heart murmur appreciated.  Abdomen: Non distended. Abdominal scar from PD catheter site. Bowel sounds normoactive. Normal to percussion.  Abdomen is nontender. No abdominal masses. Abdominal wall is soft. No palpable hernias. No palpable hepatosplenomegaly.  Anus/Perineum/Rectum: Rectum: Rectal exam deferred at this time.  Lymph: No palpable or visible regional lymphadenopathy.  Musculo: Walks with a normal gait.  Neuro: Alert and oriented x3. Affect is normal.Grossly non-focal.    Colonoscopy reports reviewed.  CT images reviewed.  BE images reviewed: no stricture visualized.    Assessment #1: Hx K55.0 Acute vascular disorders of intestine   Comments       :  Marya Amsler had an episode of acute ischemic colitis from which he has now fully recovered.  He has a resulting stricture, which is not uncommon after severe ischemia.  At this time he does not manifest any symptoms of obstruction, and I do not think his pain is related to the stricture.  In addition the recent BE reveals a normal-appearing colon.  No surgical or endoscopic intervention is advised at this time.  He does reportedly have a history of colon polyps and therefore should continue screening per Dr. Comer Locket.  If the stricture is again seen and not passable I would be happy to assist with baloon dilation.       Recommendations:  Avoid episodes of low blood pressure if possible.  No need for surgical intervention at this time.  If you develop problems passing stool we can address the narrowing via endoscopic balloon dilation.  Ok to resume Miralax if needed.  I will review the barium enema.  Suggest repeat screening colonoscopy per Dr. Comer Locket.  Follow up as needed.

## 2015-06-16 ENCOUNTER — Other Ambulatory Visit: Payer: Self-pay | Admitting: Nephrology

## 2015-06-16 DIAGNOSIS — N186 End stage renal disease: Secondary | ICD-10-CM

## 2015-06-16 DIAGNOSIS — Z992 Dependence on renal dialysis: Secondary | ICD-10-CM

## 2015-06-20 ENCOUNTER — Telehealth: Payer: Self-pay | Admitting: Cardiology

## 2015-06-20 NOTE — Telephone Encounter (Signed)
Pre procedure call made to Hadley Pen     Allergies   Allergen Reactions    Penicillins Swelling     Face    Coconut Flavor Nausea And Vomiting    Amoxicillin Rash    Vancomycin Hives and Itching     Had red raised area on right forearm, where vein went up arm. Itching     No Known Latex Allergy        If patient has a known contrast allergy appropriate radiology provider notified:   []  yes [x]  no []  N/A    Patient/Family Anesthetic Sensitivity: []  Yes []  No    Does the patient have a history of sleep apnea?   []  Yes-pt instructed to bring C-pap or Bi-pap machine   [x]  No     Height: 20ft4in, weight: 189 LBS    Patient instructed to:   Take all of your medications with a sip of water.   Hold herbal medications and supplements   Hold anti-coagulation medication per SIR guidelines []  Yes [x]  No []  NA   To take half dose of insulin on the morning of the procedure []  Yes [] No [] NA    NPO Instructions for SEDATED procedures:   No milk products/solid foods 6 hours before procedure   No breast milk 4 hours before procedure   No clear liquids 2 hours before procedure    Transportation has been arranged for patient Post-Procedure: [x]  Yes []  No    Additional patient instructions:   Please leave personal belongings and valuables at home or give them to a family member/friend during procedure.     Please do not bring children     Please arrive at (time) 8 am and anticipate that you will be here for 2.5 hours.   Patient given instructions on how to find the radiology department.  Labs:  IR Lab Values From Results Review Latest Ref Rng & Units 04/26/2011   HCT 40 - 51 % -   HGB 13.7 - 17.5 g/dL -   PLT 150 - 330 THOU/uL -   INR 1.0 - 1.2 1.0   K 3.3 - 5.1 mmol/L -   NA 133 - 145 mmol/L -   UN 6 - 20 mg/dL -   CREAT 0.67 - 1.17 mg/dL -   GFRB * -   GFRC * -      If no labs available patient asked if they have had labs drawn outside the [facility] system in the last 30 days: Yes, Where: dialysis    [] If no current  labs, instructed patient to arrive 30 minutes prior to original arrival time.

## 2015-06-22 NOTE — Telephone Encounter (Signed)
Pt confirmed appointment on 4/10 but rescheduled to 4/19 at 8 am because he was going out of town through 4/17.

## 2015-06-29 ENCOUNTER — Encounter: Payer: Self-pay | Admitting: Nephrology

## 2015-06-29 ENCOUNTER — Ambulatory Visit
Admission: RE | Admit: 2015-06-29 | Discharge: 2015-06-29 | Disposition: A | Discharge status: Home / Self Care | Payer: Self-pay | Source: Ambulatory Visit | Attending: Nephrology | Admitting: Nephrology

## 2015-06-29 MED ORDER — VANCOMYCIN HCL IN DEXTROSE 5 MG/ML IV SOLN *I*
INTRAVENOUS | Status: AC | PRN
Start: 2015-06-29 — End: 2015-06-29
  Administered 2015-06-29: 500 mg via INTRAVENOUS

## 2015-06-29 MED ORDER — DIPHENHYDRAMINE HCL 50 MG/ML IJ SOLN *I*
INTRAMUSCULAR | Status: AC | PRN
Start: 2015-06-29 — End: 2015-06-29
  Administered 2015-06-29: 25 mg via INTRAVENOUS

## 2015-06-29 MED ORDER — IOHEXOL 240 MG/ML (OMNIPAQUE) IV SOLN *I*
INTRAMUSCULAR | Status: AC | PRN
Start: 2015-06-29 — End: 2015-06-29
  Administered 2015-06-29: 60 mL via INTRAVENOUS

## 2015-06-29 MED ORDER — HEPARIN SODIUM (PORCINE) 1000 UNIT/ML IJ SOLN *WRAPPED*
Status: AC | PRN
Start: 2015-06-29 — End: 2015-06-29
  Administered 2015-06-29: 1.6 mL via INTRAVENOUS

## 2015-06-29 NOTE — Procedures (Signed)
Procedure Report    Change of tunneled dialysis catheter with a 14.5 French Palindrom  Rupture of fibrin sheath

## 2015-06-29 NOTE — Preop H&P (Signed)
OUTPATIENT  Chief Complaint: dialysis catheter malfunction    History of Present Illness:  HPI    Past Medical History:   Diagnosis Date    Colon polyp     Dialysis patient     ESRD (end stage renal disease)     Hypercholesterolemia     Hypotension     Thyroid disease     hypo     Past Surgical History:   Procedure Laterality Date    Right inguinal hernia repair       Family History   Problem Relation Age of Onset    Diabetes Mother     Asthma Mother     Coronary art dis Father      Social History     Social History    Marital status: Married     Spouse name: N/A    Number of children: N/A    Years of education: N/A     Social History Main Topics    Smoking status: Former Smoker     Packs/day: 0.30     Years: 15.00     Types: Cigarettes     Quit date: 08/02/1992    Smokeless tobacco: None    Alcohol use No    Drug use: No    Sexual activity: Not Asked     Other Topics Concern    None     Social History Narrative       Allergies:   Allergies   Allergen Reactions    Penicillins Swelling     Face    Coconut Flavor Nausea And Vomiting    Amoxicillin Rash    Vancomycin Hives and Itching     Had red raised area on right forearm, where vein went up arm. Itching     No Known Latex Allergy        Current Outpatient Prescriptions   Medication    acetaminophen (TYLENOL) 325 MG tablet    aspirin 81 MG EC tablet    sevelamer (RENAGEL) 800 MG tablet    midodrine (PROAMATINE) 10 MG tablet    fluticasone (FLONASE) 50 MCG/ACT nasal spray    atorvastatin (LIPITOR) 10 MG tablet    fludrocortisone (FLORINEF) 0.1 mg tablet    b complex-vitamin c-folic acid (NEPHRO-VITE) tablet    calcium-vitamin D (OSCAL-500) 500-200 MG-UNIT per tablet    cinacalcet (SENSIPAR) 30 MG tablet    omeprazole (PRILOSEC) 20 MG capsule    docusate sodium (COLACE) 100 MG capsule    minocycline (MINOCIN) 100 MG capsule    diflunisal (DOLOBID) 500 MG TABS    pantoprazole (PROTONIX) 40 MG EC tablet    Omega-3 Fatty Acids (FISH  OIL) 1200 MG CPDR    Polyethylene Glycol 3350 (MIRALAX PO)     No current facility-administered medications for this encounter.         Review of Systems:   ROS    Last Nursing documented pain:  0-10 Scale: 0 (06/29/15 0834)      Patient Vitals for the past 24 hrs:   BP Temp Temp src Pulse Resp SpO2 Height Weight   06/29/15 0849 (!) 81/54 - - - - - - -   06/29/15 0844 (!) 76/52 36.2 C (97.2 F) TEMPORAL 77 15 96 % - -   06/29/15 0834 - - - - - - 162.6 cm (5\' 4" ) 84.7 kg (186 lb 11.7 oz)     O2 Device: None (Room air) (06/29/15 0844)      Physical Exam  Lab Results:   none    Radiology impressions (last 3 days):  No results found.    Currently Active/Followed Hospital Problems:  There are no active hospital problems to display for this patient.      Assessment: no sedation due to very low B/P    Plan: catheter change    Author: Lyman Bishop, MD  Note created: 06/29/2015  at: 9:40 AM

## 2015-06-29 NOTE — Progress Notes (Signed)
Pt discharged to home.  DSDI.  No c/o pain.  Pt tolerated po intake well.  No reaction to Vancomycin.  Discharge instructions reveiwed with good understanding

## 2015-06-29 NOTE — Discharge Instructions (Signed)
Tunneled Central Line Discharge Instructions   06/29/2015  10:46 AM    Provider performing test/procedure: Dr. Lyman Bishop    Patient received medication? Yes. You have been given medicine, Versed and Fentanyl, that may make you sleepy. Do not drive, operate  heavy machinery, drink alcoholic beverages, make important personal or business decisions, or sign legal documents until the next day.     You must be with a responsible adult the first night after your test.   You can eat your usual diet.   Rest for the remainder of the day. Return to normal activities in 24 hours.   You may not shower or get the dressing wet.   Do not remove the bandage from the catheter site. If bleeding occurs at the catheter site, apply firm pressure to the site for 10 minutes. If the bandage is soaked with blood, apply pressure and call your doctor immediately.   Resume your usual medications.   If you take warfarin (Coumadin), check with your primary doctor before restarting this medicine.   Do not take aspirin (including Anacin), Ibuprofen (Advil,Motrin,Nuprin) or naproxen sodium (Aleve) for the next 24 to 48 hours unless you directed by your primary doctor.   Acetaminophen (Tylenol) is the drug of choice if you have pain.     Call Your Doctor IF:   You have signs of infection at the catheter site: fever, chills, severe pain or swelling, foul smelling drainage.   If your catheter gets pulled out.      Monday - Friday 8am-5pm: For questions or concerns regarding your procedure please call [facility] ([facility] [phone].    After hours/Weekends:  [facility] patients may call [phone] and ask to  speak with the Radiology resident on call.   [facility] patients may call [phone] to speak with the [place] or call [phone] and ask to speak with the radiology resident on call.    The Emergency Department is open 24 hours a day at [place] if emergency treatment is required.

## 2015-06-29 NOTE — Progress Notes (Signed)
Pt had dialysis yesterday but dialysis catheter with poor function.  Pt here today for catheter change over wire.    Dr Leonette Monarch would like to give Pt benadryl then Vanco for procedure.  Nurse will run IV Vanco over 1 hour.

## 2015-06-29 NOTE — Interval H&P Note (Signed)
UPDATES TO PATIENT'S CONDITION on the DAY OF SURGERY/PROCEDURE    I. Updates to Patient's Condition (to be completed by a provider privileged to complete a H&P, following reassessment of the patient by the provider):    Day of Surgery/Procedure Update:  History  History reviewed and no change    Physical  Physical exam updated and no change            II. Procedure Readiness   I have reviewed the patient's H&P and updated condition. By completing and signing this form, I attest that this patient is ready for surgery/procedure.    III. Attestation   I have reviewed the updated information regarding the patient's condition and it is appropriate to proceed with the planned surgery/procedure.    Lyman Bishop, MD as of 9:41 AM 06/29/2015

## 2015-06-29 NOTE — Progress Notes (Signed)
[employer] Nursing Procedure Note    Shane Pearson  [id number]    Procedure: dialysis catheter change over wire        Status: Completed    Patient tolerated procedure well     Specimen Collection: no    Sponge count: N/A    Fluid Removed:N/A  Procedure Dressing Site located:Left IJ  Dressing Type:sterile gauze/tegaderm and suture  Biopatch:no  Dressing status:Clean, dry and intact   Hematoma:Not evident  Medication received:IV benadryl and Vancomycin  Cardiovascular:   Peripheral Pulses: Present post procedure      Fistula: N/A    Neuro Assessment:Patient is at pre-procedure baseline    Implant patient information given to patient or parent/guardian:N/A    Report given [id number] recovery nurse. Name: Lenna Sciara      Last Filed Vitals    06/29/15 1036   BP: (!) 80/53   Pulse: 62   Resp: 15   Temp:    SpO2: 100%     O2 Device: Nasal cannula

## 2015-06-29 NOTE — H&P (View-Only) (Signed)
OUTPATIENT  Chief Complaint: dialysis catheter malfunction    History of Present Illness:  HPI    Past Medical History:   Diagnosis Date    Colon polyp     Dialysis patient     ESRD (end stage renal disease)     Hypercholesterolemia     Hypotension     Thyroid disease     hypo     Past Surgical History:   Procedure Laterality Date    Right inguinal hernia repair       Family History   Problem Relation Age of Onset    Diabetes Mother     Asthma Mother     Coronary art dis Father      Social History     Social History    Marital status: Married     Spouse name: N/A    Number of children: N/A    Years of education: N/A     Social History Main Topics    Smoking status: Former Smoker     Packs/day: 0.30     Years: 15.00     Types: Cigarettes     Quit date: 08/02/1992    Smokeless tobacco: None    Alcohol use No    Drug use: No    Sexual activity: Not Asked     Other Topics Concern    None     Social History Narrative       Allergies:   Allergies   Allergen Reactions    Penicillins Swelling     Face    Coconut Flavor Nausea And Vomiting    Amoxicillin Rash    Vancomycin Hives and Itching     Had red raised area on right forearm, where vein went up arm. Itching     No Known Latex Allergy        Current Outpatient Prescriptions   Medication    acetaminophen (TYLENOL) 325 MG tablet    aspirin 81 MG EC tablet    sevelamer (RENAGEL) 800 MG tablet    midodrine (PROAMATINE) 10 MG tablet    fluticasone (FLONASE) 50 MCG/ACT nasal spray    atorvastatin (LIPITOR) 10 MG tablet    fludrocortisone (FLORINEF) 0.1 mg tablet    b complex-vitamin c-folic acid (NEPHRO-VITE) tablet    calcium-vitamin D (OSCAL-500) 500-200 MG-UNIT per tablet    cinacalcet (SENSIPAR) 30 MG tablet    omeprazole (PRILOSEC) 20 MG capsule    docusate sodium (COLACE) 100 MG capsule    minocycline (MINOCIN) 100 MG capsule    diflunisal (DOLOBID) 500 MG TABS    pantoprazole (PROTONIX) 40 MG EC tablet    Omega-3 Fatty Acids (FISH  OIL) 1200 MG CPDR    Polyethylene Glycol 3350 (MIRALAX PO)     No current facility-administered medications for this encounter.         Review of Systems:   ROS    Last Nursing documented pain:  0-10 Scale: 0 (06/29/15 0834)      Patient Vitals for the past 24 hrs:   BP Temp Temp src Pulse Resp SpO2 Height Weight   06/29/15 0849 (!) 81/54 - - - - - - -   06/29/15 0844 (!) 76/52 36.2 C (97.2 F) TEMPORAL 77 15 96 % - -   06/29/15 0834 - - - - - - 162.6 cm (5\' 4" ) 84.7 kg (186 lb 11.7 oz)     O2 Device: None (Room air) (06/29/15 0844)      Physical Exam  Lab Results:   none    Radiology impressions (last 3 days):  No results found.    Currently Active/Followed Hospital Problems:  There are no active hospital problems to display for this patient.      Assessment: no sedation due to very low B/P    Plan: catheter change    Author: Lyman Bishop, MD  Note created: 06/29/2015  at: 9:40 AM

## 2015-08-01 ENCOUNTER — Other Ambulatory Visit
Admission: RE | Admit: 2015-08-01 | Discharge: 2015-08-01 | Disposition: A | Discharge status: Home / Self Care | Payer: Self-pay | Source: Ambulatory Visit | Attending: Dermatology | Admitting: Dermatology

## 2015-08-03 LAB — SURGICAL PATHOLOGY

## 2015-10-29 ENCOUNTER — Other Ambulatory Visit
Admission: RE | Admit: 2015-10-29 | Discharge: 2015-10-29 | Disposition: A | Discharge status: Home / Self Care | Payer: Self-pay | Source: Ambulatory Visit | Attending: Nephrology | Admitting: Nephrology

## 2015-10-29 LAB — HEMOGLOBIN: Hemoglobin: 11.5 g/dL — ABNORMAL LOW (ref 13.7–17.5)

## 2015-10-29 LAB — MCHC: MCHC: 30 g/dL — ABNORMAL LOW (ref 32–37)

## 2015-11-17 IMAGING — CR DG CHEST 1V
1 series · 1 of 1 positions shown · non-contrast
Comparison: None.

CLINICAL DATA: Vascular access problem.

EXAM:
CHEST - 1 VIEW

[w chest pa]
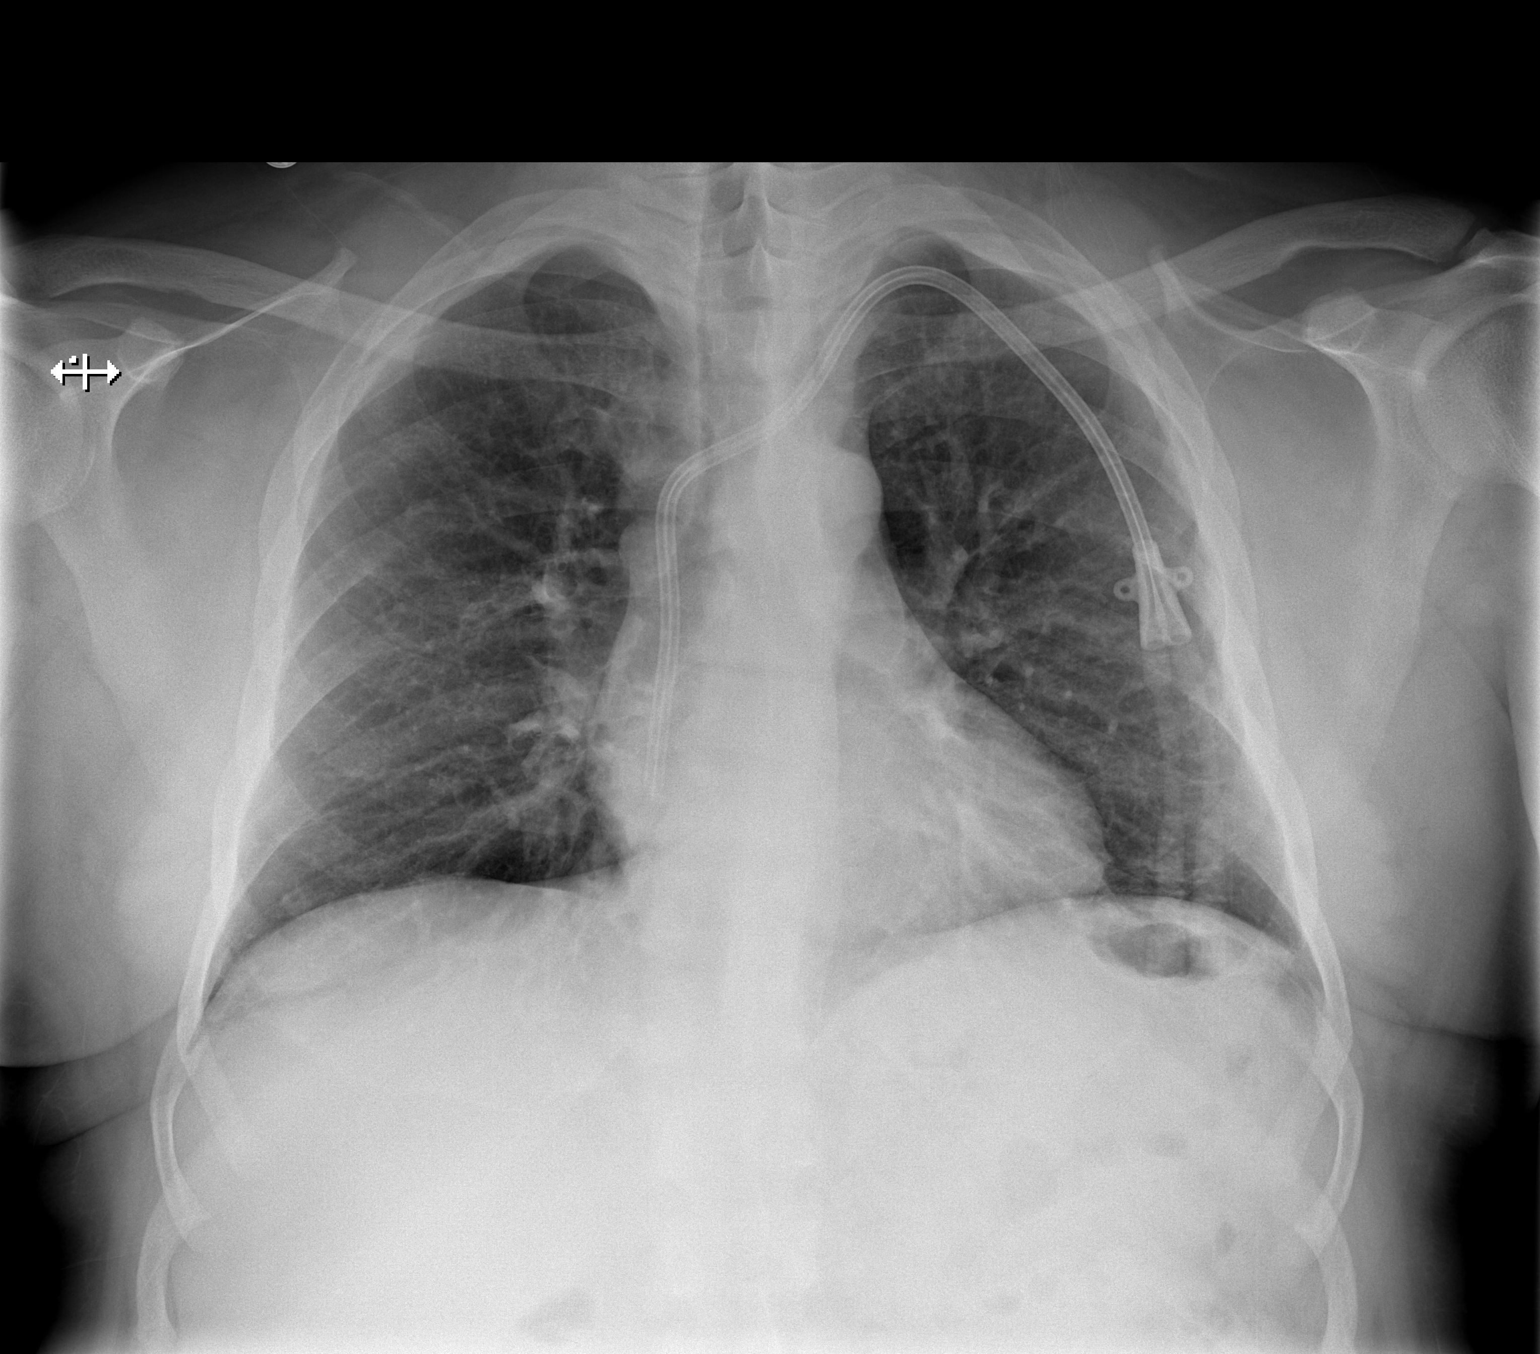

[1 of 1 positions shown; findings below may reference images not displayed]

FINDINGS: Left dialysis catheter is in place with the tips in the right
atrium. Heart is normal size. Lungs are clear. No effusions or acute
bony abnormality.
IMPRESSION: No active disease.

## 2015-11-24 ENCOUNTER — Other Ambulatory Visit
Admission: RE | Admit: 2015-11-24 | Discharge: 2015-11-24 | Disposition: A | Discharge status: Home / Self Care | Payer: Self-pay | Source: Ambulatory Visit | Attending: Nephrology | Admitting: Nephrology

## 2015-11-24 LAB — HEPATITIS B SURFACE ANTIGEN: HBV S Ag: NEGATIVE

## 2016-03-01 ENCOUNTER — Other Ambulatory Visit: Payer: Self-pay | Admitting: Nephrology

## 2016-03-01 DIAGNOSIS — N186 End stage renal disease: Secondary | ICD-10-CM

## 2016-03-01 DIAGNOSIS — Z992 Dependence on renal dialysis: Secondary | ICD-10-CM

## 2016-03-13 ENCOUNTER — Telehealth: Payer: Self-pay

## 2016-03-13 NOTE — Telephone Encounter (Signed)
Pre procedure call made to Hadley Pen     Allergies   Allergen Reactions    Penicillins Swelling     Face    Coconut Flavor Nausea And Vomiting    Amoxicillin Rash    Vancomycin Hives and Itching     Had red raised area on right forearm, where vein went up arm. Itching  Pt given IV Vanco today with 25mg  IV benadryl for Dialysis catheter change- No reaction noted - 06/29/2015    No Known Latex Allergy        If patient has a known contrast allergy appropriate radiology provider notified:   []  yes []  no [x]  N/A    Patient/Family Anesthetic Sensitivity: []  Yes [x]  No    Does the patient have a history of sleep apnea?   []  Yes-pt instructed to bring C-pap or Bi-pap machine   [x]  No       Patient instructed to:   Take all of your medications with a sip of water.   Hold herbal medications and supplements   Hold anti-coagulation medication per SIR guidelines []  Yes []  No [x]  NA   To take half dose of insulin on the morning of the procedure []  Yes [] No [x] NA    NPO Instructions for SEDATED procedures:   No milk products/solid foods 6 hours before procedure   No clear liquids 2 hours before procedure    Transportation has been arranged for patient Post-Procedure: [x]  Yes []  No    Additional patient instructions:   Please leave personal belongings and valuables at home or give them to a family member/friend during procedure.     Please do not bring children     Please arrive at (time) 1400 and anticipate that you will be here for 3 hours.   Patient given instructions on how to find the radiology department.  Labs:  IR Lab Values From Results Review Latest Ref Rng & Units 10/29/2015   HCT 40 - 51 % -   HGB 13.7 - 17.5 g/dL 11.5(L)   PLT 150 - 330 THOU/uL -   INR 1.0 - 1.2 -   K 3.3 - 5.1 mmol/L -   NA 133 - 145 mmol/L -   UN 6 - 20 mg/dL -   CREAT 0.67 - 1.17 mg/dL -   GFRB * -   GFRC * -         [x] If no current labs, instructed patient to arrive 30 minutes prior to original arrival time.

## 2016-03-14 ENCOUNTER — Encounter: Payer: Self-pay | Admitting: Nephrology

## 2016-03-14 ENCOUNTER — Ambulatory Visit
Admission: RE | Admit: 2016-03-14 | Discharge: 2016-03-14 | Disposition: A | Discharge status: Home / Self Care | Payer: Self-pay | Source: Ambulatory Visit | Attending: Nephrology | Admitting: Nephrology

## 2016-03-14 HISTORY — DX: Unspecified osteoarthritis, unspecified site: M19.90

## 2016-03-14 HISTORY — DX: Heart failure, unspecified: I50.9

## 2016-03-14 MED ORDER — HEPARIN LOCK FLUSH 10 UNIT/ML IJ SOLN WRAPPED *I*
INTRAVENOUS | Status: AC
Start: 2016-03-14 — End: 2016-03-14
  Filled 2016-03-14: qty 10

## 2016-03-14 MED ORDER — HEPARIN SODIUM (PORCINE) 1000 UNIT/ML IJ SOLN *WRAPPED*
Status: AC | PRN
Start: 2016-03-14 — End: 2016-03-14
  Administered 2016-03-14: 1.6 mL via INTRAVENOUS

## 2016-03-14 MED ORDER — MIDAZOLAM HCL 1 MG/ML IJ SOLN *I* WRAPPED
INTRAMUSCULAR | Status: AC
Start: 2016-03-14 — End: 2016-03-14
  Filled 2016-03-14: qty 2

## 2016-03-14 MED ORDER — FENTANYL CITRATE 50 MCG/ML IJ SOLN *WRAPPED*
INTRAMUSCULAR | Status: AC
Start: 2016-03-14 — End: 2016-03-14
  Filled 2016-03-14: qty 2

## 2016-03-14 NOTE — Preop H&P (Signed)
OUTPATIENT  Chief Complaint: Existing hemodialysis catheter not working well    History of Present Illness:  HPI    Past Medical History:   Diagnosis Date    Arthritis     CHF (congestive heart failure)     Colon polyp     Dialysis patient     ESRD (end stage renal disease)     Hypercholesterolemia     Hypotension     Thyroid disease     hypo     Past Surgical History:   Procedure Laterality Date    Right inguinal hernia repair       Family History   Problem Relation Age of Onset    Diabetes Mother     Asthma Mother     Coronary art dis Father      Social History     Social History    Marital status: Married     Spouse name: N/A    Number of children: N/A    Years of education: N/A     Social History Main Topics    Smoking status: Former Smoker     Packs/day: 0.30     Years: 15.00     Types: Cigarettes     Quit date: 08/02/1992    Smokeless tobacco: Never Used    Alcohol use No    Drug use: No    Sexual activity: Not Asked     Other Topics Concern    None     Social History Narrative       Allergies:   Allergies   Allergen Reactions    Penicillins Swelling     Face    Coconut Flavor Nausea And Vomiting    Amoxicillin Rash    Vancomycin Hives and Itching     Had red raised area on right forearm, where vein went up arm. Itching  Pt given IV Vanco today with 25mg  IV benadryl for Dialysis catheter change- No reaction noted - 06/29/2015    No Known Latex Allergy        Current Outpatient Prescriptions   Medication    acetaminophen (TYLENOL) 325 MG tablet    pantoprazole (PROTONIX) 40 MG EC tablet    aspirin 81 MG EC tablet    sevelamer (RENAGEL) 800 MG tablet    midodrine (PROAMATINE) 10 MG tablet    fluticasone (FLONASE) 50 MCG/ACT nasal spray    atorvastatin (LIPITOR) 10 MG tablet    fludrocortisone (FLORINEF) 0.1 mg tablet    b complex-vitamin c-folic acid (NEPHRO-VITE) tablet    calcium-vitamin D (OSCAL-500) 500-200 MG-UNIT per tablet    cinacalcet (SENSIPAR) 30 MG tablet     omeprazole (PRILOSEC) 20 MG capsule    docusate sodium (COLACE) 100 MG capsule    minocycline (MINOCIN) 100 MG capsule    diflunisal (DOLOBID) 500 MG TABS    Omega-3 Fatty Acids (FISH OIL) 1200 MG CPDR    Polyethylene Glycol 3350 (MIRALAX PO)     No current facility-administered medications for this encounter.         Review of Systems:   ROS    Last Nursing documented pain:  0-10 Scale: 0 (03/14/16 1427)      Patient Vitals for the past 24 hrs:   BP Temp Temp src Pulse Resp SpO2 Height Weight   03/14/16 1427 (!) 84/58 36.2 C (97.2 F) TEMPORAL 87 16 94 % 165.1 cm (5\' 5" ) 84.5 kg (186 lb 4.6 oz)  Physical Exam    Lab Results:   none    Radiology impressions (last 3 days):  No results found.    Currently Active/Followed Hospital Problems:  There are no active hospital problems to display for this patient.      Plan: Replace existing dialysis cather for a new catheter.    Author: Dallas Breeding, MD  Note created: 03/14/2016  at: 4:30 PM

## 2016-03-14 NOTE — Discharge Instructions (Signed)
Tunneled Central Line Discharge Instructions   03/14/2016  4:10 PM    Provider performing test/procedure: Dr Iran Planas    Patient received medication? No     You must be with a responsible adult the first night after your test.   You can eat your usual diet.   Rest for the remainder of the day. Return to normal activities in 24 hours.   You may not shower or get the dressing wet.   Do not remove the bandage from the catheter site. If bleeding occurs at the catheter site, apply firm pressure to the site for 10 minutes. If the bandage is soaked with blood, apply pressure and call your doctor immediately.   Resume your usual medications.   If you take warfarin (Coumadin), check with your primary doctor before restarting this medicine.   Do not take aspirin (including Anacin), Ibuprofen (Advil,Motrin,Nuprin) or naproxen sodium (Aleve) for the next 24 to 48 hours unless you directed by your primary doctor.   Acetaminophen (Tylenol) is the drug of choice if you have pain.     Call Your Doctor IF:   You have signs of infection at the catheter site: fever, chills, severe pain or swelling, foul smelling drainage.   If your catheter gets pulled out.      Your procedure today was performed at [facility] Interventional Radiology Department.    For questions or concerns regarding your procedure, please call the Interventional Radiology section at [phone], Monday through Friday, 7 am - 5 pm.      For urgent questions after hours, call the Interventional Radiology Doctor on-call at [phone].    For emergency treatment, [facility] Emergency Department is open 24 hours a day.  You may also seek attention at your local hospital's Emergency Department.

## 2016-03-14 NOTE — Procedures (Signed)
Procedure Report    Immediately uncomplicated exchange of left tunneled dialysis catheter.

## 2016-03-14 NOTE — Progress Notes (Signed)
[employer] [employer] Nursing Procedure Note    Tison Leibold  [id number]    Procedure: TCL change over wire        Status: Completed    Patient tolerated procedure well     Specimen Collection: no    Sponge count: N/A    Fluid Removed:N/A  Procedure Dressing Site located:Left IJ  Dressing Type:sterile gauze/tegaderm  Biopatch:yes  Dressing status:Clean, dry and intact   Hematoma:Not evident  Medication received:None  Cardiovascular:   Peripheral Pulses: N/A      Fistula: N/A    Neuro Assessment:Patient is at pre-procedure baseline    Implant patient information given to patient or parent/guardian:N/A    Report given [id number] recovery nurse. Name: Lilia Pro    Ports red capped  Last Filed Vitals    03/14/16 1635   BP: (!) 89/54   Pulse: 85   Resp: 17   Temp:    SpO2: 97%

## 2016-05-13 ENCOUNTER — Ambulatory Visit
Admission: AD | Admit: 2016-05-13 | Discharge: 2016-05-13 | Disposition: A | Discharge status: Home / Self Care | Payer: Medicare Other | Attending: Emergency Medicine | Admitting: Emergency Medicine

## 2016-05-13 DIAGNOSIS — H10023 Other mucopurulent conjunctivitis, bilateral: Secondary | ICD-10-CM | POA: Insufficient documentation

## 2016-05-13 MED ORDER — CIPROFLOXACIN HCL 0.3 % OP SOLN *I*
1.0000 [drp] | OPHTHALMIC | 0 refills | Status: AC
Start: 2016-05-13 — End: 2016-05-20

## 2016-05-13 NOTE — Discharge Instructions (Signed)
Wash hands frequently and avoid touching eye    Throw away current pair of contacts, as well as any eye makeup you are using. Avoid new eye makeup or contacts for 7 days.     Wash your towels, washcloths, and bedding, such as pillow cases, frequently as these come in contact with your eyes and can spread the infection.     You are still contagious for 24 hours after starting the antibiotics     Warm compresses to eye can be helpful for symptom relief. Apply for 10 minutes on and 10 minutes off as needed.    Ibuprofen every 6 hours can be helpful for pain/inflammation    Follow up with your PCP as needed

## 2016-05-13 NOTE — UC Provider Note (Signed)
History     Chief Complaint   Patient presents with    Eye Problem     Patient presents with bilateral eyes reddened, swelling and drainage since yesterday.      HPI Comments: 56 year old male with a history of CHF and ESRD on dialysis presents for evaluation of bilateral eye redness.  He states his eyes are chronically red and dry do his dialysis, however the past 2 days they have become irritated, and he is having tearing as well as drainage.  He denies any recent URI symptoms or allergic rhinitis.  He does use eye drops at home to alleviate dry eye.    Patient is a 56 y.o. male presenting with conjunctivitis.   History provided by:  Patient  Language interpreter used: No    Conjunctivitis   Location:  Both eyes  Quality:  Aching  Severity:  Moderate  Onset quality:  Sudden  Duration:  2 days  Timing:  Constant  Progression:  Worsening  Chronicity:  New  Context comment:  Spontaneous  Relieved by:  Nothing  Worsened by:  Nothing  Associated symptoms: discharge, inflammation, redness and tearing    Associated symptoms: no blurred vision, no crusting, no decreased vision, no headaches, no scotomas and no swelling        Past Medical History:   Diagnosis Date    Arthritis     CHF (congestive heart failure)     Colon polyp     Dialysis patient     ESRD (end stage renal disease)     Hypercholesterolemia     Hypotension     Thyroid disease     hypo            Past Surgical History:   Procedure Laterality Date    Right inguinal hernia repair         Family History   Problem Relation Age of Onset    Diabetes Mother     Asthma Mother     Coronary art dis Father          Social History    reports that he quit smoking about 23 years ago. His smoking use included Cigarettes. He has a 4.50 pack-year smoking history. He has never used smokeless tobacco. He reports that he does not drink alcohol or use illicit drugs. His sexual activity history is not on file.    Living Situation     Questions Responses     Patient lives with Spouse    Homeless     Caregiver for other family member     External Services     Employment Employed    Domestic Violence Risk           Review of Systems   Review of Systems   Constitutional: Negative.    HENT: Negative.    Eyes: Positive for discharge and redness. Negative for blurred vision.   Respiratory: Negative.    Cardiovascular: Negative.    Skin: Negative.    Neurological: Negative for dizziness, light-headedness and headaches.   Psychiatric/Behavioral: Negative.        Physical Exam     ED Triage Vitals   BP Heart Rate Heart Rate (via Pulse Ox) Resp Temp Temp src SpO2 (Retired) O2 Device O2 Flow Rate   05/13/16 1202 05/13/16 1202 -- 05/13/16 1202 05/13/16 1202 05/13/16 1202 05/13/16 1202 -- --   109/52 85  18 36.3 C (97.3 F) TEMPORAL 100 %  Weight           --                          Visual Acuity  Bilateral Distance: 20/15  R Distance: 20/20  L Distance: 20/25    Physical Exam   Constitutional: He is oriented to person, place, and time. He appears well-developed and well-nourished. He does not appear ill. No distress.   Eyes: EOM are normal. Pupils are equal, round, and reactive to light. Right eye exhibits discharge. Right eye exhibits no chemosis, no exudate and no hordeolum. No foreign body present in the right eye. Left eye exhibits discharge. Left eye exhibits no chemosis, no exudate and no hordeolum. No foreign body present in the left eye. Right conjunctiva is injected. Left conjunctiva is injected.   Cardiovascular: Normal rate and regular rhythm.    Pulmonary/Chest: Effort normal.   Lymphadenopathy:     He has no cervical adenopathy.   Neurological: He is alert and oriented to person, place, and time.   Skin: Skin is warm and dry. He is not diaphoretic.   Psychiatric: He has a normal mood and affect. His behavior is normal. Judgment and thought content normal.   Nursing note and vitals reviewed.       Medical Decision Making    no diagnostic studies  ordered    Initial Evaluation:  ED First Provider Contact     Date/Time Event User Comments    05/13/16 1214 ED First Provider Contact Jadia Capers Initial Face to Face Provider Contact          Patient was seen on: 05/13/2016        Assessment:  56 y.o.male comes to the [facility] [place] with Bilateral conjunctivitis onset yesterday    Differential Diagnosis includes Acute Bacterial Conjunctivitis  Acute Viral Conjunctivitis  Acute Allergic Conjunctivitis  Acute Bacterial URI  Acute Viral illness                  Plan:   Orders Placed This Encounter    ciprofloxacin (CILOXAN) 0.3 % ophthalmic solution   Wash hands frequently and avoid touching eye    Throw away current pair of contacts, as well as any eye makeup you are using. Avoid new eye makeup or contacts for 7 days.     Wash your towels, washcloths, and bedding, such as pillow cases, frequently as these come in contact with your eyes and can spread the infection.     You are still contagious for 24 hours after starting the antibiotics     Warm compresses to eye can be helpful for symptom relief. Apply for 10 minutes on and 10 minutes off as needed.    Ibuprofen every 6 hours can be helpful for pain/inflammation    Follow up with your PCP as needed    No results found for this or any previous visit (from the past 24 hour(s)).      Final Diagnosis    ICD-10-CM ICD-9-CM   1. Other mucopurulent conjunctivitis of both eyes H10.023 372.03       Encourage fluids, encourage rest, good hand hygiene.    Use over the counter medications as discussed.    Please start the new medications as below:    Current Discharge Medication List      New Medications    Details Last Dose Given Next Dose Due Script Given?   ciprofloxacin (CILOXAN) 1 drop Dose:  1 drop  Place 1 drop into both eyes every 4 hours  Quantity 5 mL, Refill 0  Start date: 05/13/2016, End date: 05/20/2016       Comments: Emergency Encounter                   Please follow up with your physician as  below:    Follow-up Information     Follow up with Patrici Ranks, MD.    Specialty:  Internal Medicine    Why:  If symptoms worsen    Contact information:    [address]  [place] [address]  [phone]          Thank you Walker Sitar for coming to [facility] for your health care concerns.    If your condition changes and/or worsens please follow up with her primary doctor and/or return to the urgent care center.    If short of breath, chest pains or any other concerns please report to the emergency room.    In the event of an Emergency dial 911.    Final Diagnosis  Final diagnoses:   [K93.818] Other mucopurulent conjunctivitis of both eyes (Primary)           Shan Levans, NP    Collaborating physician Philippa Chester, MD was immediately available     Shan Levans, NP  05/13/16 1558

## 2016-05-21 ENCOUNTER — Other Ambulatory Visit
Admission: RE | Admit: 2016-05-21 | Discharge: 2016-05-21 | Disposition: A | Discharge status: Home / Self Care | Payer: Medicare Other | Source: Ambulatory Visit | Attending: Dermatology | Admitting: Dermatology

## 2016-05-21 DIAGNOSIS — L905 Scar conditions and fibrosis of skin: Secondary | ICD-10-CM | POA: Insufficient documentation

## 2016-05-21 DIAGNOSIS — L738 Other specified follicular disorders: Secondary | ICD-10-CM

## 2016-05-22 ENCOUNTER — Encounter: Payer: Medicare Other | Admitting: Nephrology

## 2016-05-22 DIAGNOSIS — Z992 Dependence on renal dialysis: Secondary | ICD-10-CM

## 2016-05-22 DIAGNOSIS — N186 End stage renal disease: Secondary | ICD-10-CM

## 2016-05-22 DIAGNOSIS — I120 Hypertensive chronic kidney disease with stage 5 chronic kidney disease or end stage renal disease: Secondary | ICD-10-CM

## 2016-05-25 LAB — SURGICAL PATHOLOGY

## 2016-06-12 ENCOUNTER — Encounter: Payer: Medicare Other | Admitting: Nephrology

## 2016-06-12 DIAGNOSIS — Z992 Dependence on renal dialysis: Secondary | ICD-10-CM

## 2016-06-12 DIAGNOSIS — I120 Hypertensive chronic kidney disease with stage 5 chronic kidney disease or end stage renal disease: Secondary | ICD-10-CM

## 2016-06-12 DIAGNOSIS — N186 End stage renal disease: Secondary | ICD-10-CM

## 2016-08-04 ENCOUNTER — Encounter: Payer: Medicare Other | Admitting: Nephrology

## 2016-08-04 DIAGNOSIS — I120 Hypertensive chronic kidney disease with stage 5 chronic kidney disease or end stage renal disease: Secondary | ICD-10-CM

## 2016-08-04 DIAGNOSIS — N186 End stage renal disease: Secondary | ICD-10-CM

## 2016-08-04 DIAGNOSIS — Z992 Dependence on renal dialysis: Secondary | ICD-10-CM

## 2016-08-21 ENCOUNTER — Encounter: Payer: Medicare Other | Admitting: Nephrology

## 2016-08-21 DIAGNOSIS — I120 Hypertensive chronic kidney disease with stage 5 chronic kidney disease or end stage renal disease: Secondary | ICD-10-CM

## 2016-08-21 DIAGNOSIS — N186 End stage renal disease: Secondary | ICD-10-CM

## 2016-08-21 DIAGNOSIS — Z992 Dependence on renal dialysis: Secondary | ICD-10-CM

## 2016-09-11 ENCOUNTER — Encounter: Payer: Medicare Other | Admitting: Nephrology

## 2016-09-11 DIAGNOSIS — Z992 Dependence on renal dialysis: Secondary | ICD-10-CM

## 2016-09-11 DIAGNOSIS — N186 End stage renal disease: Secondary | ICD-10-CM

## 2016-09-11 DIAGNOSIS — I120 Hypertensive chronic kidney disease with stage 5 chronic kidney disease or end stage renal disease: Secondary | ICD-10-CM

## 2016-10-16 ENCOUNTER — Encounter: Payer: Medicare Other | Admitting: Nephrology

## 2016-10-16 DIAGNOSIS — N186 End stage renal disease: Secondary | ICD-10-CM

## 2016-10-16 DIAGNOSIS — I120 Hypertensive chronic kidney disease with stage 5 chronic kidney disease or end stage renal disease: Secondary | ICD-10-CM

## 2016-10-16 DIAGNOSIS — Z992 Dependence on renal dialysis: Secondary | ICD-10-CM

## 2016-11-20 ENCOUNTER — Encounter: Payer: Medicare Other | Admitting: Nephrology

## 2016-11-20 DIAGNOSIS — I120 Hypertensive chronic kidney disease with stage 5 chronic kidney disease or end stage renal disease: Secondary | ICD-10-CM

## 2016-11-20 DIAGNOSIS — Z992 Dependence on renal dialysis: Secondary | ICD-10-CM

## 2016-11-20 DIAGNOSIS — N186 End stage renal disease: Secondary | ICD-10-CM

## 2016-11-29 ENCOUNTER — Other Ambulatory Visit: Payer: Self-pay | Admitting: Transplant

## 2016-11-29 DIAGNOSIS — N186 End stage renal disease: Secondary | ICD-10-CM

## 2016-11-29 DIAGNOSIS — Z992 Dependence on renal dialysis: Secondary | ICD-10-CM

## 2016-12-10 ENCOUNTER — Telehealth: Payer: Self-pay

## 2016-12-10 NOTE — Telephone Encounter (Signed)
Left voice message for pt to confirm and prepare for exchange of HD cath scheduled at [facility] on 10/02 at 0930.

## 2016-12-10 NOTE — Telephone Encounter (Signed)
Called pt back to confirm HD cath exchange was scheduled on 10/05 at 0900 arrival at [address]

## 2016-12-11 ENCOUNTER — Encounter: Payer: Medicare Other | Admitting: Nephrology

## 2016-12-11 DIAGNOSIS — N186 End stage renal disease: Secondary | ICD-10-CM

## 2016-12-11 DIAGNOSIS — I120 Hypertensive chronic kidney disease with stage 5 chronic kidney disease or end stage renal disease: Secondary | ICD-10-CM

## 2016-12-11 DIAGNOSIS — Z992 Dependence on renal dialysis: Secondary | ICD-10-CM

## 2016-12-14 ENCOUNTER — Ambulatory Visit
Admission: RE | Admit: 2016-12-14 | Discharge: 2016-12-14 | Disposition: A | Discharge status: Home / Self Care | Payer: Medicare Other | Source: Ambulatory Visit | Attending: Transplant | Admitting: Transplant

## 2016-12-14 DIAGNOSIS — N186 End stage renal disease: Secondary | ICD-10-CM | POA: Insufficient documentation

## 2016-12-14 DIAGNOSIS — Z992 Dependence on renal dialysis: Secondary | ICD-10-CM

## 2016-12-14 DIAGNOSIS — I509 Heart failure, unspecified: Secondary | ICD-10-CM | POA: Insufficient documentation

## 2016-12-14 DIAGNOSIS — E1122 Type 2 diabetes mellitus with diabetic chronic kidney disease: Secondary | ICD-10-CM | POA: Insufficient documentation

## 2016-12-14 DIAGNOSIS — Y832 Surgical operation with anastomosis, bypass or graft as the cause of abnormal reaction of the patient, or of later complication, without mention of misadventure at the time of the procedure: Secondary | ICD-10-CM | POA: Insufficient documentation

## 2016-12-14 DIAGNOSIS — Z87891 Personal history of nicotine dependence: Secondary | ICD-10-CM | POA: Insufficient documentation

## 2016-12-14 DIAGNOSIS — T82828A Fibrosis of vascular prosthetic devices, implants and grafts, initial encounter: Secondary | ICD-10-CM | POA: Insufficient documentation

## 2016-12-14 MED ORDER — IOHEXOL 240 MG/ML (OMNIPAQUE) IV SOLN *I*
Freq: Once | INTRAMUSCULAR | Status: AC | PRN
Start: 2016-12-14 — End: 2016-12-14
  Administered 2016-12-14: 22 mL via INTRAVENOUS

## 2016-12-14 NOTE — Invasive Procedure Plan of Care (Signed)
Invasive Procedure Plan of Care (Consent Form 419):   Condition(s) Addressed: Venous access   Person Performing Procedure: Lyman Bishop, and Vascular and Interventional Radiology Attendings, Fellows, Residents and Advanced Practice Providers     Side:    Procedure: A venous catheter will be changed over a wire   Special Equipment:    Planned Anesthesia: Pending   Benefits: Establish secure central venous access.   Risks: Allergic reaction to dye, bleeding, shock, infection, collapse of lung, possible need for chest tube placement, catheter malposition, thrombus, mechanical phlebitis, and in very rare circumstances death.   Alternatives: Not to perform the procedure   Expected Length of Stay: 0 day(s)   Pt Decision: Agrees to proceed     ----------------------------------------------------------------------------------------------------------------------------------------  Consent:  I hereby give my consent and authorize Lyman Bishop, and Vascular and Interventional Radiology Attendings, Fellows, Residents and Advanced Practice Providers    (The list of possible assistants, all of whom are privileged to provide surgical services at the hospital, is available)  To treat the following: Venous access  Procedure includes: A venous catheter will be changed over a wire  1 The care provider has explained my condition to me, the benefits of having the above treatment procedure, and alternate ways of treating my condition. I understand that no guarantees have been made to me about the result of the treatment. The alternatives to this procedure include: Not to perform the procedure   2 The care provider has discussed with me the reasonably foreseeable risks of the treatment and that there may be undesirable results. The risks that are specifically related to this procedure include: Allergic reaction to dye, bleeding, shock, infection, collapse of lung, possible need for chest tube placement, catheter malposition, thrombus,  mechanical phlebitis, and in very rare circumstances death.   3 I understand that during the treatment a condition may be discovered which was not known before the treatment started. Therefore, I authorize the care provider to perform any additional or different treatment which is thought necessary and available.   4 Any tissue, parts, or substances removed during the procedure may be retained or disposed of in accordance with customary scientific, educational and clinical practice.   5 Vendor information if appropriate:      6 Patient Consent for Medical or Surgical Procedure: I have carefully read and fully understand this informed consent form, and have had sufficient opportunity to discuss my condition and the above procedure(s) with the care provider and his/her associates, and all of my questions have been answered to my satisfaction.    Agrees to proceed       ____________________________________________________   _______________   Signature of Patient  Date/Time     ____________________________________________________   _______________   Signature of Parent or Legal Guardian  (if Patent is unable to sign or is a minor) Date/Time       Complete this section for all OR procedures and all other invasive internal procedures performed in any setting.  7 Consent for Receipt of Tissue(s): Not applicable   8 For those procedures that have the potential for significant blood loss: transfusion is not expected to be needed but may be required and given in an emergency, I consent to the transfusion of blood or blood components that may be necessary before, during or after the procedure. I have been informed that no transfusion is 100% safe, however present testing methods make the risks of infection very small. Risks include infection from viruses, bacteria, or parasites, including  but not limited to HIV (the AIDS virus) and hepatitis, as well as fever, chills, allergy, volume overload, or death. I have discussed  possible alternatives with my care provider, including no transfusion, autologous transfusion (donation of my own blood), designated/directed donor transfusion (collection of blood from donors selected by me) or blood salvage during the procedure. I understand that these alternatives may not be available due to timing or health reasons, and the above risks may still apply.   9 Patient Consent for Blood/Tissue: I have had a chance to discuss the risks, benefits and alternatives regarding transfusion/receipt of tissue (as above) with my healthcare provider. My decision(s) regarding the transfusion of blood or blood components and/or the receipt of tissue are as above. I understand this covers my perioperative/periprocedural (before, during, and after the surgery/procedure) course of treatment.    Patient sign here unless 7 and 8 do not apply.       ____________________________________________________   _______________   Signature of Patient Date/Time     ____________________________________________________   _______________   Signature of Parent or Legal Guardian  (if Patent is unable to sign or is a minor) Date/Time      I have discussed the planned procedure, including the potential for any transfusion of blood products or receipt of tissue as necessary, expected benefits, the potential complications and risks and possible alternatives and their benefits and risks with the patient or the patient's surrogate. In my opinion, the patent or the patient's surrogate understands the proposed procedure, its risks, benefits, and alternatives.    Electronically signed by Lyman Bishop, MD at 9:23 AM

## 2016-12-14 NOTE — Procedures (Signed)
Procedure Report    Tunneled dialysis catheter was changed over a wire with 14.5 F Palindrome catheter    Fibrin sheath was removed using a 8 mm balloon.

## 2016-12-14 NOTE — Preop H&P (Signed)
OUTPATIENT  Chief Complaint: malfunction dialysis catheter    History of Present Illness:  HPI    Past Medical History:   Diagnosis Date    Arthritis     CHF (congestive heart failure)     Colon polyp     Dialysis patient     ESRD (end stage renal disease)     Hypercholesterolemia     Hypotension     Thyroid disease     hypo     Past Surgical History:   Procedure Laterality Date    Right inguinal hernia repair       Family History   Problem Relation Age of Onset    Diabetes Mother     Asthma Mother     Coronary art dis Father      Social History     Social History    Marital status: Married     Spouse name: N/A    Number of children: N/A    Years of education: N/A     Social History Main Topics    Smoking status: Former Smoker     Packs/day: 0.30     Years: 15.00     Types: Cigarettes     Quit date: 08/02/1992    Smokeless tobacco: Never Used    Alcohol use No    Drug use: No    Sexual activity: Not Asked     Other Topics Concern    None     Social History Narrative       Allergies:   Allergies   Allergen Reactions    Penicillins Swelling     Face    Coconut Flavor Nausea And Vomiting    Amoxicillin Rash    No Known Latex Allergy        Current Outpatient Prescriptions   Medication    acetaminophen (TYLENOL) 325 MG tablet    pantoprazole (PROTONIX) 40 MG EC tablet    aspirin 81 MG EC tablet    Omega-3 Fatty Acids (FISH OIL) 1200 MG CPDR    Polyethylene Glycol 3350 (MIRALAX PO)    sevelamer (RENAGEL) 800 MG tablet    midodrine (PROAMATINE) 10 MG tablet    fluticasone (FLONASE) 50 MCG/ACT nasal spray    atorvastatin (LIPITOR) 10 MG tablet    fludrocortisone (FLORINEF) 0.1 mg tablet    b complex-vitamin c-folic acid (NEPHRO-VITE) tablet    calcium-vitamin D (OSCAL-500) 500-200 MG-UNIT per tablet    cinacalcet (SENSIPAR) 30 MG tablet    omeprazole (PRILOSEC) 20 MG capsule    docusate sodium (COLACE) 100 MG capsule    minocycline (MINOCIN) 100 MG capsule    diflunisal (DOLOBID)  500 MG TABS     No current facility-administered medications for this encounter.         Review of Systems:   ROS    Last Nursing documented pain:        No data found.           Physical Exam    Lab Results:   none    Radiology impressions (last 3 days):  No results found.    Currently Active/Followed Hospital Problems:  There are no active hospital problems to display for this patient.      Assessment:     Plan: dialysis catheter change    Author: Lyman Bishop, MD  Note created: 12/14/2016  at: 9:25 AM

## 2016-12-14 NOTE — Discharge Instructions (Signed)
Tunneled Central Line Discharge Instructions   12/14/2016  10:34 AM    Provider performing test/procedure: Dr. Lyman Bishop    Patient received medication? Yes. You have been given medicine, ((No medications administered for sedation)), that may make you sleepy. Do not drive, operate  heavy machinery, drink alcoholic beverages, make important personal or business decisions, or sign legal documents until the next day.     You must be with a responsible adult the first night after your test.   You can eat your usual diet.   Rest for the remainder of the day. Return to normal activities in 24 hours.   You may not shower or get the dressing wet.   Do not remove the bandage from the catheter site. If bleeding occurs at the catheter site, apply firm pressure to the site for 10 minutes. If the bandage is soaked with blood, apply pressure and call your doctor immediately.   Resume your usual medications.   If you take warfarin (Coumadin), check with your primary doctor before restarting this medicine.   Do not take aspirin (including Anacin), Ibuprofen (Advil,Motrin,Nuprin) or naproxen sodium (Aleve) for the next 24 to 48 hours unless you directed by your primary doctor.   Acetaminophen (Tylenol) is the drug of choice if you have pain.     Call Your Doctor IF:   You have signs of infection at the catheter site: fever, chills, severe pain or swelling, foul smelling drainage.   If your catheter gets pulled out.      Monday - Friday 8am-5pm: For questions or concerns regarding your procedure please call [facility] ([facility] [phone].    After hours/Weekends:  [facility] patients may call [phone] and ask to  speak with the Radiology resident on call.   [facility] patients may call [phone] to speak with the [place] or call [phone] and ask to speak with the radiology resident on call.    The Emergency Department is open 24  hours a day at [facility] if emergency treatment is required.

## 2016-12-14 NOTE — Progress Notes (Signed)
[employer] Nursing Procedure Note    Juron Vorhees  [id number]    Procedure: TCL change over wire        Status: Completed    Patient tolerated procedure well     Specimen Collection: no    Sponge count: N/A    Fluid Removed:N/A  Procedure Dressing Site located:Left upper chest  Dressing Type:sterile gauze/tegaderm  Biopatch:yes  Dressing status:Clean, dry and intact   Hematoma:Not evident  Medication received:Vancomyin 500mg   Cardiovascular:   Peripheral Pulses: Unchanged from baseline      Fistula: N/A    Neuro Assessment:Patient is at pre-procedure baseline    Implant patient information given to patient or parent/guardian:N/A    Report given GL:OVFIEPPI recovery nurse. Name: Ardelle Lesches, RN      Last Filed Vitals    12/14/16 1030   BP: 95/63   Pulse: 67   Resp: 12   Temp: 36.4 C (97.5 F)   SpO2: 96%

## 2016-12-20 ENCOUNTER — Other Ambulatory Visit: Payer: Self-pay | Admitting: Transplant

## 2016-12-20 DIAGNOSIS — N186 End stage renal disease: Secondary | ICD-10-CM

## 2016-12-20 DIAGNOSIS — Z992 Dependence on renal dialysis: Secondary | ICD-10-CM

## 2016-12-25 ENCOUNTER — Telehealth: Payer: Self-pay | Admitting: Cardiology

## 2016-12-25 NOTE — Telephone Encounter (Signed)
Left message for Pt to call back to confirm appointment for 10/19 at 2:30 pm for HD catheter change

## 2016-12-28 ENCOUNTER — Ambulatory Visit
Admission: RE | Admit: 2016-12-28 | Discharge: 2016-12-28 | Disposition: A | Discharge status: Home / Self Care | Payer: Medicare Other | Source: Ambulatory Visit | Attending: Transplant | Admitting: Transplant

## 2016-12-28 DIAGNOSIS — Y832 Surgical operation with anastomosis, bypass or graft as the cause of abnormal reaction of the patient, or of later complication, without mention of misadventure at the time of the procedure: Secondary | ICD-10-CM | POA: Insufficient documentation

## 2016-12-28 DIAGNOSIS — Z992 Dependence on renal dialysis: Secondary | ICD-10-CM

## 2016-12-28 DIAGNOSIS — Z87891 Personal history of nicotine dependence: Secondary | ICD-10-CM | POA: Insufficient documentation

## 2016-12-28 DIAGNOSIS — T82828A Fibrosis of vascular prosthetic devices, implants and grafts, initial encounter: Secondary | ICD-10-CM | POA: Insufficient documentation

## 2016-12-28 DIAGNOSIS — N186 End stage renal disease: Secondary | ICD-10-CM | POA: Insufficient documentation

## 2016-12-28 DIAGNOSIS — I509 Heart failure, unspecified: Secondary | ICD-10-CM | POA: Insufficient documentation

## 2016-12-28 MED ORDER — FENTANYL CITRATE 50 MCG/ML IJ SOLN *WRAPPED*
Freq: Once | INTRAMUSCULAR | Status: AC | PRN
Start: 2016-12-28 — End: 2016-12-28
  Administered 2016-12-28: 25 ug via INTRAVENOUS

## 2016-12-28 MED ORDER — VANCOMYCIN HCL IN DEXTROSE 5 MG/ML IV SOLN *I*
INTRAVENOUS | Status: AC | PRN
Start: 2016-12-28 — End: 2016-12-28
  Administered 2016-12-28: 500 mg via INTRAVENOUS

## 2016-12-28 MED ORDER — MIDAZOLAM HCL 1 MG/ML IJ SOLN *I* WRAPPED
Freq: Once | INTRAMUSCULAR | Status: AC | PRN
Start: 2016-12-28 — End: 2016-12-28
  Administered 2016-12-28: .5 mg via INTRAVENOUS

## 2016-12-28 NOTE — Invasive Procedure Plan of Care (Signed)
Invasive Procedure Plan of Care (Consent Form 419):   Condition(s) Addressed: Need for central vascular access.   Person Performing Procedure:    Side:    Procedure: Insertion of central venous catheter.   Special Equipment: None   Planned Anesthesia: Local   Benefits: Administration of medications, reliable vascular access, repeated blood draws, measurement of central venous pressure.   Risks: Bleeding, injury to vessel, infection, blood clots, need for repeated procedures, abnormal heart rhythyms, collapsed lung. In rare circumstances this procedure may result in death.    Alternatives: Attempting alternative monitoring techniques and administration of fluids and medications through a peripheral IV.   Expected Length of Stay:  day(s)   Pt Decision: Agrees to proceed     ----------------------------------------------------------------------------------------------------------------------------------------  Consent:  I hereby give my consent and authorize   (The list of possible assistants, all of whom are privileged to provide surgical services at the hospital, is available)  To treat the following: Need for central vascular access.  Procedure includes: Insertion of central venous catheter.  1 The care provider has explained my condition to me, the benefits of having the above treatment procedure, and alternate ways of treating my condition. I understand that no guarantees have been made to me about the result of the treatment. The alternatives to this procedure include: Attempting alternative monitoring techniques and administration of fluids and medications through a peripheral IV.   2 The care provider has discussed with me the reasonably foreseeable risks of the treatment and that there may be undesirable results. The risks that are specifically related to this procedure include: Bleeding, injury to vessel, infection, blood clots, need for repeated procedures, abnormal heart rhythyms, collapsed lung. In rare  circumstances this procedure may result in death.    3 I understand that during the treatment a condition may be discovered which was not known before the treatment started. Therefore, I authorize the care provider to perform any additional or different treatment which is thought necessary and available.   4 Any tissue, parts, or substances removed during the procedure may be retained or disposed of in accordance with customary scientific, educational and clinical practice.   5 Vendor information if appropriate: If a vendor representative is expected to be present during my procedure, it has been explained to me that the vendor representative works for (manufacturer of the device to be used) and that his/her role includes . I consent to the vendor representative's presence and involvement as described. If circumstances change and a decision is made during my procedure that a vendor representative's presence is needed, I will be notified of the above after my procedure is completed.  Equipment: None     6 Patient Consent for Medical or Surgical Procedure: I have carefully read and fully understand this informed consent form, and have had sufficient opportunity to discuss my condition and the above procedure(s) with the care provider and his/her associates, and all of my questions have been answered to my satisfaction.    Agrees to proceed       ____________________________________________________   _______________   Signature of Patient  Date/Time     ____________________________________________________   _______________   Signature of Parent or Legal Guardian  (if Patent is unable to sign or is a minor) Date/Time       Complete this section for all OR procedures and all other invasive internal procedures performed in any setting.  7 Consent for Receipt of Tissue(s): Not applicable   8 For those procedures that  have the potential for significant blood loss: not applicable.   9 Patient Consent for Blood/Tissue: Not  applicable.    Patient sign here unless 7 and 8 do not apply.       ____________________________________________________   _______________   Signature of Patient Date/Time     ____________________________________________________   _______________   Signature of Parent or Legal Guardian  (if Patent is unable to sign or is a minor) Date/Time      I have discussed the planned procedure, including the potential for any transfusion of blood products or receipt of tissue as necessary, expected benefits, the potential complications and risks and possible alternatives and their benefits and risks with the patient or the patient's surrogate. In my opinion, the patent or the patient's surrogate understands the proposed procedure, its risks, benefits, and alternatives.    Electronically signed by Lora Paula, MD at 2:49 PM

## 2016-12-28 NOTE — Invasive Procedure Plan of Care (Signed)
Invasive Procedure Plan of Care (Consent Form 419):   Condition(s) Addressed: Need for moderate sedation during procedure/minor surgery.   Person Performing Procedure:    Side: Not applicable    Procedure: Establishment of moderate sedation.   Special Equipment: None   Planned Anesthesia:    Benefits: State of altered consciousness created by administration of intravenous medications (medications in an IV).  Patients under moderate sedation will feel sleepy/relaxed, but will respond to commands and may have memory of the procedure.  The purpose of moderate sedation is to make you feel comfortable during your procedure, and can make your procedure easier to perform.     Risks: Excessive sedation, which may require special procedures to keep you safe, including placement of a breathing tube.  Nausea, vomiting, problems with heart rate or blood pressure, breathing difficilties, very rarely death. Occasionally, vivid dreams or agitation requiring additional medication.   Alternatives: Not undergoing procedure, undergoing procedure without sedation.   Expected Length of Stay:  day(s)   Pt Decision: Agrees to proceed     ----------------------------------------------------------------------------------------------------------------------------------------  Consent:  I hereby give my consent and authorize   (The list of possible assistants, all of whom are privileged to provide surgical services at the hospital, is available)  To treat the following: Need for moderate sedation during procedure/minor surgery.  Procedure includes: Establishment of moderate sedation.  1 The care provider has explained my condition to me, the benefits of having the above treatment procedure, and alternate ways of treating my condition. I understand that no guarantees have been made to me about the result of the treatment. The alternatives to this procedure include: Not undergoing procedure, undergoing procedure without sedation.   2 The care  provider has discussed with me the reasonably foreseeable risks of the treatment and that there may be undesirable results. The risks that are specifically related to this procedure include: Excessive sedation, which may require special procedures to keep you safe, including placement of a breathing tube.  Nausea, vomiting, problems with heart rate or blood pressure, breathing difficilties, very rarely death. Occasionally, vivid dreams or agitation requiring additional medication.   3 I understand that during the treatment a condition may be discovered which was not known before the treatment started. Therefore, I authorize the care provider to perform any additional or different treatment which is thought necessary and available.   4 Any tissue, parts, or substances removed during the procedure may be retained or disposed of in accordance with customary scientific, educational and clinical practice.   5 Vendor information if appropriate: If a vendor representative is expected to be present during my procedure, it has been explained to me that the vendor representative works for (manufacturer of the device to be used) and that his/her role includes . I consent to the vendor representative's presence and involvement as described. If circumstances change and a decision is made during my procedure that a vendor representative's presence is needed, I will be notified of the above after my procedure is completed.  Equipment: None     6 Patient Consent for Medical or Surgical Procedure: I have carefully read and fully understand this informed consent form, and have had sufficient opportunity to discuss my condition and the above procedure(s) with the care provider and his/her associates, and all of my questions have been answered to my satisfaction.    Agrees to proceed       ____________________________________________________   _______________   Signature of Patient  Date/Time        ____________________________________________________   _______________   Signature of Parent or Legal Guardian  (if Patent is unable to sign or is a minor) Date/Time       Complete this section for all OR procedures and all other invasive internal procedures performed in any setting.  7 Consent for Receipt of Tissue(s): Not applicable   8 For those procedures that have the potential for significant blood loss: not applicable.   9 Patient Consent for Blood/Tissue: Not applicable.    Patient sign here unless 7 and 8 do not apply.       ____________________________________________________   _______________   Signature of Patient Date/Time     ____________________________________________________   _______________   Signature of Parent or Legal Guardian  (if Patent is unable to sign or is a minor) Date/Time      I have discussed the planned procedure, including the potential for any transfusion of blood products or receipt of tissue as necessary, expected benefits, the potential complications and risks and possible alternatives and their benefits and risks with the patient or the patient's surrogate. In my opinion, the patent or the patient's surrogate understands the proposed procedure, its risks, benefits, and alternatives.    Electronically signed by Lora Paula, MD at 2:49 PM

## 2016-12-28 NOTE — Preop H&P (Signed)
General H&P for Inpatients    Chief Complaint: ESRD    History of Present Illness:  HPI    Past Medical History:   Diagnosis Date    Arthritis     CHF (congestive heart failure)     Colon polyp     Dialysis patient     ESRD (end stage renal disease)     Hypercholesterolemia     Hypotension     Thyroid disease     hypo     Past Surgical History:   Procedure Laterality Date    Right inguinal hernia repair       Family History   Problem Relation Age of Onset    Diabetes Mother     Asthma Mother     Coronary art dis Father      Social History     Social History    Marital status: Married     Spouse name: N/A    Number of children: N/A    Years of education: N/A     Social History Main Topics    Smoking status: Former Smoker     Packs/day: 0.30     Years: 15.00     Types: Cigarettes     Quit date: 08/02/1992    Smokeless tobacco: Never Used    Alcohol use No    Drug use: No    Sexual activity: Not on file     Other Topics Concern    Not on file     Social History Narrative       Allergies:   Allergies   Allergen Reactions    Penicillins Swelling     Face    Coconut Flavor Nausea And Vomiting    Amoxicillin Rash    No Known Latex Allergy          (Not in a hospital admission)   Current Outpatient Prescriptions   Medication    acetaminophen (TYLENOL) 325 MG tablet    pantoprazole (PROTONIX) 40 MG EC tablet    aspirin 81 MG EC tablet    Omega-3 Fatty Acids (FISH OIL) 1200 MG CPDR    Polyethylene Glycol 3350 (MIRALAX PO)    sevelamer (RENAGEL) 800 MG tablet    midodrine (PROAMATINE) 10 MG tablet    fluticasone (FLONASE) 50 MCG/ACT nasal spray    atorvastatin (LIPITOR) 10 MG tablet    fludrocortisone (FLORINEF) 0.1 mg tablet    b complex-vitamin c-folic acid (NEPHRO-VITE) tablet    calcium-vitamin D (OSCAL-500) 500-200 MG-UNIT per tablet    cinacalcet (SENSIPAR) 30 MG tablet    omeprazole (PRILOSEC) 20 MG capsule    docusate sodium (COLACE) 100 MG capsule    minocycline (MINOCIN) 100 MG  capsule    diflunisal (DOLOBID) 500 MG TABS     No current facility-administered medications for this encounter.        Review of Systems:   ROS    Last Nursing documented pain:        No data found.           Physical Exam    Lab Results: All labs in the last 72 hours:No results found for this or any previous visit (from the past 72 hour(s)).    Radiology Impressions (last 3 days):  No results found.    Currently Active/Followed Hospital Problems:  There are no active hospital problems to display for this patient.      Assessment: ESRD    Plan: dialysis catheter check and change  Author: Lora Paula, MD  Note created: 12/28/2016  at: 2:48 PM

## 2016-12-28 NOTE — Discharge Instructions (Signed)
Tunneled Central Line Discharge Instructions   12/28/2016  5:21 PM    Provider performing test/procedure: Dr. Tilden Dome    Patient received medication? Yes. You have been given medicine, Versed and Fentanyl, that may make you sleepy. Do not drive, operate  heavy machinery, drink alcoholic beverages, make important personal or business decisions, or sign legal documents until the next day.     You must be with a responsible adult the first night after your test.   You can eat your usual diet.   Rest for the remainder of the day. Return to normal activities in 24 hours.   You may not shower or get the dressing wet.   Do not remove the bandage from the catheter site. If bleeding occurs at the catheter site, apply firm pressure to the site for 10 minutes. If the bandage is soaked with blood, apply pressure and call your doctor immediately.   Resume your usual medications.   If you take warfarin (Coumadin), check with your primary doctor before restarting this medicine.   Do not take aspirin (including Anacin), Ibuprofen (Advil,Motrin,Nuprin) or naproxen sodium (Aleve) for the next 24 to 48 hours unless you directed by your primary doctor.   Acetaminophen (Tylenol) is the drug of choice if you have pain.     Call Your Doctor IF:   You have signs of infection at the catheter site: fever, chills, severe pain or swelling, foul smelling drainage.   If your catheter gets pulled out.      Monday - Friday 8am-5pm: For questions or concerns regarding your procedure please call [facility] ([facility] [phone].    After hours/Weekends:  [facility] patients may call [phone] and ask to  speak with the Radiology resident on call.   [facility] patients may call [phone] to speak with the [place] or call [phone] and ask to speak with the radiology resident on call.    The Emergency Department is open 24 hours a day at [place] if emergency treatment is required.

## 2016-12-28 NOTE — Progress Notes (Signed)
[employer] [employer] Nursing Procedure Note    Shane Pearson  [id number]    Procedure: HD catheter change        Status: Completed    Patient tolerated procedure well     Specimen Collection: no    Sponge count: N/A    Fluid Removed:N/A  Procedure Dressing Site located:Left IJ  Dressing Type:sterile gauze/tegaderm  Biopatch:yes  Dressing status:Clean, dry and intact   Hematoma:Not evident  Medication received:Versed 1mg  and Fentanyl 81mcg  Cardiovascular:   Peripheral Pulses: Unchanged from baseline      Fistula: N/A    Neuro Assessment:Patient is at pre-procedure baseline    Implant patient information given to patient or parent/guardian:N/A    Report given YI:[id number] recovery nurse. Name: Ardelle Lesches, RN      Last Filed Vitals    12/28/16 1718   BP: 93/57   Pulse: 77   Resp: 12   Temp: 36.5 C (97.7 F)   SpO2: 100%

## 2017-01-04 ENCOUNTER — Emergency Department
Admission: EM | Admit: 2017-01-04 | Discharge: 2017-01-04 | Discharge status: Against Medical Advice | Payer: Medicare Other | Source: Ambulatory Visit | Attending: Emergency Medicine | Admitting: Emergency Medicine

## 2017-01-04 ENCOUNTER — Encounter: Payer: Self-pay | Admitting: Transplant

## 2017-01-04 ENCOUNTER — Other Ambulatory Visit: Payer: Self-pay | Admitting: Cardiology

## 2017-01-04 DIAGNOSIS — I499 Cardiac arrhythmia, unspecified: Secondary | ICD-10-CM

## 2017-01-04 DIAGNOSIS — R0602 Shortness of breath: Secondary | ICD-10-CM | POA: Insufficient documentation

## 2017-01-04 LAB — EKG 12-LEAD
P: 63 deg
PR: 148 ms
QRS: 29 deg
QRSD: 78 ms
QT: 344 ms
QTc: 417 ms
Rate: 88 {beats}/min
Severity: NORMAL
T: 142 deg

## 2017-01-04 NOTE — Telephone Encounter (Signed)
Error- please disregard

## 2017-01-04 NOTE — ED Triage Notes (Addendum)
Rx Baclofen as prescribed by PCP for back pain on Monday, first dose Tuesday with some lethargy, took dose on Wednesday and experienced AMS and memory issues.  Since then, pt endorses dyspnea when lying in bed.   Concern for potential medication reaction to Baclofen.  EKG at triage.       Triage Note   Shane Stable, RN

## 2017-01-15 ENCOUNTER — Encounter: Payer: Medicare Other | Admitting: Nephrology

## 2017-01-15 DIAGNOSIS — N186 End stage renal disease: Secondary | ICD-10-CM

## 2017-01-15 DIAGNOSIS — I120 Hypertensive chronic kidney disease with stage 5 chronic kidney disease or end stage renal disease: Secondary | ICD-10-CM

## 2017-01-15 DIAGNOSIS — Z992 Dependence on renal dialysis: Secondary | ICD-10-CM

## 2017-02-26 ENCOUNTER — Encounter: Payer: Medicare Other | Admitting: Nephrology

## 2017-02-26 DIAGNOSIS — N186 End stage renal disease: Secondary | ICD-10-CM

## 2017-02-26 DIAGNOSIS — I120 Hypertensive chronic kidney disease with stage 5 chronic kidney disease or end stage renal disease: Secondary | ICD-10-CM

## 2017-02-26 DIAGNOSIS — Z992 Dependence on renal dialysis: Secondary | ICD-10-CM

## 2017-02-26 LAB — UNMAPPED LAB RESULTS
ABO RH Blood Type (HT): A POS — NL
Antibody Screen (HT): NEGATIVE — NL
Basophil # (HT): 0 10 3/uL — NL (ref 0.0–0.2)
Basophil % (HT): 0 % — NL (ref 0–2)
Eosinophil # (HT): 0.2 10 3/uL — NL (ref 0.0–0.5)
Eosinophil % (HT): 2 % — NL (ref 0–7)
Hematocrit (HT): 41 % — NL (ref 40–52)
Hematocrit (HT): 39 % — ABNORMAL LOW (ref 40–52)
Hemoglobin (HGB) (HT): 12.2 g/dL — ABNORMAL LOW (ref 13.0–17.5)
Hemoglobin (HGB) (HT): 11.7 g/dL — ABNORMAL LOW (ref 13.0–17.5)
Lymphocyte # (HT): 1.1 10 3/uL — NL (ref 0.9–3.8)
Lymphocyte % (HT): 10 % — ABNORMAL LOW (ref 17–44)
MCHC (HT): 30 g/dL — ABNORMAL LOW (ref 32.0–36.0)
MCV (HT): 83 fL — NL (ref 81–99)
Mean Corpuscular Hemoglobin (MCH) (HT): 24.8 pg — ABNORMAL LOW (ref 26.0–34.0)
Monocyte # (HT): 1 10 3/uL — NL (ref 0.2–1.0)
Monocyte % (HT): 10 % — NL (ref 4–15)
Neutrophil # (HT): 8.3 10 3/uL — ABNORMAL HIGH (ref 1.5–7.7)
Platelets (HT): 247 10 3/uL — NL (ref 140–400)
RBC (HT): 4.91 10 6/uL — NL (ref 4.60–6.20)
RDW (HT): 24.6 % — ABNORMAL HIGH (ref 11.5–15.0)
Seg Neut % (HT): 78 % — ABNORMAL HIGH (ref 43–75)
WBC (HT): 10.6 10 3/uL — ABNORMAL HIGH (ref 4.0–10.0)

## 2017-02-27 LAB — UNMAPPED LAB RESULTS
Basophil # (HT): 0 10 3/uL — NL (ref 0.0–0.2)
Basophil % (HT): 0 % — NL (ref 0–2)
Eosinophil # (HT): 0.2 10 3/uL — NL (ref 0.0–0.5)
Eosinophil % (HT): 2 % — NL (ref 0–7)
Hematocrit (HT): 39 % — ABNORMAL LOW (ref 40–52)
Hematocrit (HT): 37 % — ABNORMAL LOW (ref 40–52)
Hemoglobin (HGB) (HT): 11.3 g/dL — ABNORMAL LOW (ref 13.0–17.5)
Hemoglobin (HGB) (HT): 11.2 g/dL — ABNORMAL LOW (ref 13.0–17.5)
Lymphocyte # (HT): 1.5 10 3/uL — NL (ref 0.9–3.8)
Lymphocyte % (HT): 18 % — NL (ref 17–44)
MCHC (HT): 29.4 g/dL — ABNORMAL LOW (ref 32.0–36.0)
MCV (HT): 84 fL — NL (ref 81–99)
Mean Corpuscular Hemoglobin (MCH) (HT): 24.6 pg — ABNORMAL LOW (ref 26.0–34.0)
Monocyte # (HT): 0.9 10 3/uL — NL (ref 0.2–1.0)
Monocyte % (HT): 11 % — NL (ref 4–15)
Neutrophil # (HT): 5.9 10 3/uL — NL (ref 1.5–7.7)
Platelets (HT): 280 10 3/uL — NL (ref 140–400)
RBC (HT): 4.59 10 6/uL — ABNORMAL LOW (ref 4.60–6.20)
RDW (HT): 25.6 % — ABNORMAL HIGH (ref 11.5–15.0)
Seg Neut % (HT): 69 % — NL (ref 43–75)
WBC (HT): 8.5 10 3/uL — NL (ref 4.0–10.0)

## 2017-02-28 LAB — UNMAPPED LAB RESULTS
Hematocrit (HT): 31 % — ABNORMAL LOW (ref 40–52)
Hematocrit (HT): 33 % — ABNORMAL LOW (ref 40–52)
Hemoglobin (HGB) (HT): 9.5 g/dL — ABNORMAL LOW (ref 13.0–17.5)
Hemoglobin (HGB) (HT): 10.1 g/dL — ABNORMAL LOW (ref 13.0–17.5)
MCHC (HT): 30.3 g/dL — ABNORMAL LOW (ref 32.0–36.0)
MCHC (HT): 30.8 g/dL — ABNORMAL LOW (ref 32.0–36.0)
MCV (HT): 81 fL — NL (ref 81–99)
MCV (HT): 82 fL — NL (ref 81–99)
Mean Corpuscular Hemoglobin (MCH) (HT): 24.5 pg — ABNORMAL LOW (ref 26.0–34.0)
Mean Corpuscular Hemoglobin (MCH) (HT): 25.1 pg — ABNORMAL LOW (ref 26.0–34.0)
Platelets (HT): 238 10 3/uL — NL (ref 140–400)
Platelets (HT): 295 10 3/uL — NL (ref 140–400)
RBC (HT): 3.87 10 6/uL — ABNORMAL LOW (ref 4.60–6.20)
RBC (HT): 4.02 10 6/uL — ABNORMAL LOW (ref 4.60–6.20)
RDW (HT): 24.3 % — ABNORMAL HIGH (ref 11.5–15.0)
RDW (HT): 24.4 % — ABNORMAL HIGH (ref 11.5–15.0)
WBC (HT): 7.7 10 3/uL — NL (ref 4.0–10.0)
WBC (HT): 8.1 10 3/uL — NL (ref 4.0–10.0)

## 2017-02-28 NOTE — Consults (Signed)
NEPHROLOGY CONSULTATION NOTE   PATIENT'S NAME:Shane Pearson   MRN: [id number] DOB: Jun 18, 1960 AGE: 56 y.o.   Date: 02/28/2017 Date of Admission: 02/26/2017   Consult requested by Consulting Medicine Attending   [name] Lope, MD Lynnette Caffey, MD     REASON FOR CONSULTATION:   Asked to see him for his ESRD and dialysis need   HISTORY OF PRESENT ILLNESS/ HOSPITAL COURSE:   56 yr old AA male has ESRD and is dialysed at [facility] under Dr Arlyce Dice .   He has chronic hypotension and BP remains in 70 to 95 despite round the clock Midodrine and Fludrocortisone .    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   Patient has H/o GI bleeding and found to have Ischemic Colitis with strictures . They are being observed per patient .   Patient admitted with fresh to dark blood per rectum since Friday - Came here . BP chronically low , But did not appear volume depleted by exam . Refused saline .   Also got golyteli prep with lot of diarrhoea .    Had colonoscopy and - Found to have a large ulcer in Splenic Flexure area and stricture - Smooth wall . Fortunately bleeding stopped . HB dropped to 10.1 . HOME HG was 11.6 on 12/06 and 12.5 on 12/13  Right now tired , had sever headache , not responding to tylenol . Thinks it is not eating .    No sob , Last BM was dark but no blood .   Cipro and flagyl  started     REVIEW OF SYSTEMS:   ROS - as above    PAST MEDICAL HISTORY:   Past Medical History:   Diagnosis Date   . Adrenal insufficiency    . Chest pain 10/07/2007   . Chronic kidney disease     Dialysis 3x/wk   . Hyperlipidemia    . Hypotension     chronic   . Ischemic colitis    . Pain in extremity 12/29/2012   . Syncope and collapse     Negative ischemic workup and normal LVEF   . Syncope and collapse 12/26/2011        PAST SURGICAL HISTORY:   Past Surgical History:   Procedure Laterality Date   . AV fistula      Failed (3 on left, 1 on right)   . COLONOSCOPY N/A 02/27/2017    Procedure: COLONOSCOPY;;  Surgeon: Dytoc, Vinnie Level, MD;  Location: [facility];  Laterality: N/A   . COLONOSCOPY N/A 02/27/2017    Procedure: COLONOSCOPY BIOPSY;;  Surgeon: Dolores Frame Vinnie Level, MD;  Location: [facility];  Laterality: N/A   . INGUINAL HERNIA REPAIR Right         SOCIAL HISTORY:   Social History     Social History   . Marital status: Married     Spouse name: N/A   . Number of children: N/A   . Years of education: N/A     Occupational History   . Accountant      Social History Main Topics   . Smoking status: Former Games developer   . Smokeless tobacco: Never Used      Comment: 2 cigarettes / day.  Quit in 1999.   Marland Kitchen Alcohol use No   . Drug use: No      Comment: Stopped using marijuana in 1999   . Sexual activity: Not on file  Other Topics Concern   . Not on file     Social History Narrative    Resides with wife.  Independent.  Works as an Airline pilot.          FAMILY HISTORY:   Family History   Problem Relation Age of Onset   . Heart disease Father    . Cancer Sister    . Scleroderma Daughter    . HIV Brother    . Lung cancer Brother          ALLERGIES:   Allergies   Allergen Reactions   . Amoxicillin Rash   . Penicillins Anaphylaxis   . Baclofen Other (See Comments)     Confusion   . Coconut Rash       MEDICATIONS:   ATC medications:    acetaminophen 975 mg Bedtime   atorvastatin 20 mg QPM   B Complex-Vitamin C-Folic Acid 1 tablet QAM   calcium carbonate 1,000 mg Bedtime   calcium carbonate 500 mg QAM   cinacalcet 30 mg Daily with breakfast   ciprofloxacin 400 mg Q24H   fludrocortisone 100 mcg TID   fluticasone 2 spray Daily   iron sucrose 100 mg 3X WEEK (TU/TH/SAT)   metroNIDAZOLE 500 mg Q8H   midodrine 10 mg TID   minocycline 100 mg Bedtime   pantoprazole 40 mg QAM AC   sevelamer 800 mg QPM     IV medications:       PHYSICAL EXAMINATION:   Vitals:    02/28/17 0734 02/28/17 0745 02/28/17 0822 02/28/17 0900   BP: (!) 94/49 94/51 96/56  (!) 85/37   Pulse: 90 82 69 69   Resp:       Temp:       TempSrc:       SpO2:       Weight:        Height:          Physical Exam   Alert, oriented C/o Headache , pleasant . No abdomenal pain . Hunry . We offered ICe cream - declined.   Chest - clear    Heart - regular - distant sounds . No clear murmur   Abd - soft- no mass . No tenderness   Has a catheter  No intake/output data recorded.  No intake/output data recorded.    LABS:   Lab Results   Component Value Date    CREAT 13.50 (H) 02/28/2017    GFR1 4 (A) 02/28/2017    GFRB1 5 (A) 02/28/2017    NA 131 (L) 02/28/2017    K 3.4 (L) 02/28/2017    CO2 25 02/28/2017    CL 86 (L) 02/28/2017    PHOS 4.9 (H) 02/27/2017    CA 7.8 (L) 02/28/2017    ALB 3.8 02/27/2017    WBCR 8.1 02/28/2017    HGB 10.1 (L) 02/28/2017    HCT 33 (L) 02/28/2017    MCV 82 02/28/2017    PLTR 295 02/28/2017    INR 1.1 02/27/2017    TPRP 5.9 (L) 02/27/2017    TBIL 0.2 (L) 02/27/2017    DBIL 0.1 05/25/2014    AST 19 02/27/2017    ALT 14 02/27/2017    ALK 230 (H) 02/27/2017         DIAGNOSTIC STUDIES:   As in note      ASSESSMENT AND RECOMMENDATIONS:   1. GI_bleed , Ulcer, Ischemic colitis - Plan as per primary team and GI . See details at start of  this note   2/.  ESRD- chronic hypotension .   Started dialysis on # k bath , F180 dialyser , BFR 400 , No heparin . Weight gain 3 kgm . But unable to remove that much as BP on low side . We do not know how he does with low bp . Also k came at 3.4 - Changed to 4 k bath .   So far ok . On iv iron - reordered per OPD orders .   We need to stop his NSAID with thease bleeding    3. Headache unclera reason - Not eaten . Can not give full meal . Offered Icecream - declined                Thank you for giving [place] the opportunity to participate in the care of your patient. Please call us for questions, if any.      Electronically signed by:  Lynnette Caffey, MD   02/28/2017 10:00 AM

## 2017-03-26 ENCOUNTER — Encounter: Payer: Medicare Other | Admitting: Nephrology

## 2017-03-26 DIAGNOSIS — N186 End stage renal disease: Secondary | ICD-10-CM

## 2017-03-26 DIAGNOSIS — I120 Hypertensive chronic kidney disease with stage 5 chronic kidney disease or end stage renal disease: Secondary | ICD-10-CM

## 2017-03-26 DIAGNOSIS — Z992 Dependence on renal dialysis: Secondary | ICD-10-CM

## 2017-03-27 DIAGNOSIS — M51369 Other intervertebral disc degeneration, lumbar region without mention of lumbar back pain or lower extremity pain: Secondary | ICD-10-CM | POA: Insufficient documentation

## 2017-04-10 DIAGNOSIS — T82818A Embolism of vascular prosthetic devices, implants and grafts, initial encounter: Secondary | ICD-10-CM | POA: Insufficient documentation

## 2017-04-23 ENCOUNTER — Encounter: Payer: Medicare Other | Admitting: Nephrology

## 2017-04-23 DIAGNOSIS — I120 Hypertensive chronic kidney disease with stage 5 chronic kidney disease or end stage renal disease: Secondary | ICD-10-CM

## 2017-04-23 DIAGNOSIS — N186 End stage renal disease: Secondary | ICD-10-CM

## 2017-04-23 DIAGNOSIS — Z992 Dependence on renal dialysis: Secondary | ICD-10-CM

## 2017-05-08 DIAGNOSIS — M47817 Spondylosis without myelopathy or radiculopathy, lumbosacral region: Secondary | ICD-10-CM | POA: Insufficient documentation

## 2017-05-14 ENCOUNTER — Encounter: Payer: Medicare Other | Admitting: Nephrology

## 2017-05-14 DIAGNOSIS — I120 Hypertensive chronic kidney disease with stage 5 chronic kidney disease or end stage renal disease: Secondary | ICD-10-CM

## 2017-05-14 DIAGNOSIS — N186 End stage renal disease: Secondary | ICD-10-CM

## 2017-05-14 DIAGNOSIS — Z992 Dependence on renal dialysis: Secondary | ICD-10-CM

## 2017-06-18 ENCOUNTER — Encounter: Payer: Medicare Other | Admitting: Nephrology

## 2017-06-18 DIAGNOSIS — Z992 Dependence on renal dialysis: Secondary | ICD-10-CM

## 2017-06-18 DIAGNOSIS — N186 End stage renal disease: Secondary | ICD-10-CM

## 2017-06-18 DIAGNOSIS — I120 Hypertensive chronic kidney disease with stage 5 chronic kidney disease or end stage renal disease: Secondary | ICD-10-CM

## 2017-07-23 ENCOUNTER — Encounter: Payer: Medicare Other | Admitting: Nephrology

## 2017-07-23 DIAGNOSIS — Z992 Dependence on renal dialysis: Secondary | ICD-10-CM

## 2017-07-23 DIAGNOSIS — I120 Hypertensive chronic kidney disease with stage 5 chronic kidney disease or end stage renal disease: Secondary | ICD-10-CM

## 2017-07-23 DIAGNOSIS — N186 End stage renal disease: Secondary | ICD-10-CM

## 2017-09-02 ENCOUNTER — Encounter: Payer: Self-pay | Admitting: Transplant

## 2017-09-02 ENCOUNTER — Other Ambulatory Visit: Payer: Self-pay | Admitting: Transplant

## 2017-09-02 DIAGNOSIS — N186 End stage renal disease: Secondary | ICD-10-CM

## 2017-09-02 DIAGNOSIS — Z992 Dependence on renal dialysis: Secondary | ICD-10-CM

## 2017-09-03 ENCOUNTER — Encounter: Payer: Medicare Other | Admitting: Nephrology

## 2017-09-03 DIAGNOSIS — Z992 Dependence on renal dialysis: Secondary | ICD-10-CM

## 2017-09-03 DIAGNOSIS — N186 End stage renal disease: Secondary | ICD-10-CM

## 2017-09-03 DIAGNOSIS — I120 Hypertensive chronic kidney disease with stage 5 chronic kidney disease or end stage renal disease: Secondary | ICD-10-CM

## 2017-09-05 ENCOUNTER — Other Ambulatory Visit
Admission: RE | Admit: 2017-09-05 | Discharge: 2017-09-05 | Disposition: A | Discharge status: Home / Self Care | Payer: Medicare Other | Source: Ambulatory Visit | Attending: Transplant | Admitting: Transplant

## 2017-09-05 DIAGNOSIS — N186 End stage renal disease: Secondary | ICD-10-CM | POA: Insufficient documentation

## 2017-09-05 DIAGNOSIS — Z992 Dependence on renal dialysis: Secondary | ICD-10-CM | POA: Insufficient documentation

## 2017-09-05 LAB — PROTIME-INR
INR: 1.1 (ref 0.9–1.1)
Protime: 12.3 s (ref 10.0–12.9)

## 2017-09-05 LAB — PLATELET COUNT: Platelets: 341 10*3/uL — ABNORMAL HIGH (ref 150–330)

## 2017-09-05 LAB — DATE/TIME NOT PROVIDED

## 2017-09-13 ENCOUNTER — Ambulatory Visit: Payer: Medicare Other

## 2017-09-17 ENCOUNTER — Other Ambulatory Visit
Admission: RE | Admit: 2017-09-17 | Discharge: 2017-09-17 | Disposition: A | Discharge status: Home / Self Care | Payer: Medicare Other | Source: Ambulatory Visit | Attending: Nephrology | Admitting: Nephrology

## 2017-09-17 DIAGNOSIS — Z0189 Encounter for other specified special examinations: Secondary | ICD-10-CM | POA: Insufficient documentation

## 2017-09-17 LAB — POTASSIUM: Potassium: 4.6 mmol/L (ref 3.3–5.1)

## 2017-09-30 ENCOUNTER — Telehealth: Payer: Self-pay | Admitting: Cardiology

## 2017-10-01 NOTE — Telephone Encounter (Signed)
Left message

## 2017-10-02 ENCOUNTER — Ambulatory Visit
Admission: RE | Admit: 2017-10-02 | Discharge: 2017-10-02 | Disposition: A | Discharge status: Home / Self Care | Payer: Medicare Other | Source: Ambulatory Visit | Attending: Radiology | Admitting: Radiology

## 2017-10-02 ENCOUNTER — Other Ambulatory Visit: Payer: Self-pay | Admitting: Transplant

## 2017-10-02 DIAGNOSIS — N186 End stage renal disease: Secondary | ICD-10-CM

## 2017-10-02 DIAGNOSIS — Z992 Dependence on renal dialysis: Secondary | ICD-10-CM | POA: Insufficient documentation

## 2017-10-02 DIAGNOSIS — Z87891 Personal history of nicotine dependence: Secondary | ICD-10-CM | POA: Insufficient documentation

## 2017-10-02 DIAGNOSIS — I509 Heart failure, unspecified: Secondary | ICD-10-CM | POA: Insufficient documentation

## 2017-10-02 DIAGNOSIS — Y831 Surgical operation with implant of artificial internal device as the cause of abnormal reaction of the patient, or of later complication, without mention of misadventure at the time of the procedure: Secondary | ICD-10-CM | POA: Insufficient documentation

## 2017-10-02 DIAGNOSIS — T82898A Other specified complication of vascular prosthetic devices, implants and grafts, initial encounter: Secondary | ICD-10-CM | POA: Insufficient documentation

## 2017-10-02 DIAGNOSIS — I1311 Hypertensive heart and chronic kidney disease without heart failure, with stage 5 chronic kidney disease, or end stage renal disease: Secondary | ICD-10-CM | POA: Insufficient documentation

## 2017-10-02 DIAGNOSIS — R000 Tachycardia, unspecified: Secondary | ICD-10-CM | POA: Insufficient documentation

## 2017-10-02 MED ORDER — HEPARIN SODIUM (PORCINE) 1000 UNIT/ML IJ SOLN *WRAPPED*
Freq: Once | Status: AC | PRN
Start: 2017-10-02 — End: 2017-10-02
  Administered 2017-10-02: 1.9 mL via INTRAVENOUS

## 2017-10-02 MED ORDER — VANCOMYCIN HCL IN DEXTROSE 5 MG/ML IV SOLN *I*
INTRAVENOUS | Status: AC | PRN
Start: 2017-10-02 — End: 2017-10-02
  Administered 2017-10-02: 500 mg via INTRAVENOUS

## 2017-10-02 MED ORDER — IOHEXOL 240 MG/ML (OMNIPAQUE) IV SOLN *I*
Freq: Once | INTRAMUSCULAR | Status: AC | PRN
Start: 2017-10-02 — End: 2017-10-02
  Administered 2017-10-02: 37 mL via INTRAVENOUS

## 2017-10-02 MED ORDER — FENTANYL CITRATE 50 MCG/ML IJ SOLN *WRAPPED*
Freq: Once | INTRAMUSCULAR | Status: AC | PRN
Start: 2017-10-02 — End: 2017-10-02
  Administered 2017-10-02: 25 ug via INTRAVENOUS

## 2017-10-02 NOTE — Progress Notes (Signed)
[facility] Nursing Procedure Note    Mackey Varricchio  [id number]    Procedure: removal of old line and placement of new HD cath        Status: Completed    Patient tolerated procedure well     Specimen Collection: no    Sponge count: N/A    Fluid Removed:N/A  Procedure Dressing Site located:Left IJ  Dressing Type:Dermabond and sterile gauze/tegaderm  Biopatch:yes  Dressing status:Slight oozing- monitoring required   Hematoma:Noted- physician aware  Medication received:Fentanyl 66mcg and Vanco 500 mg  Cardiovascular:   Peripheral Pulses: N/A      Fistula: N/A    Neuro Assessment:Patient is at pre-procedure baseline    Implant patient information given to patient or parent/guardian:Yes    Report given NT:[id number] recovery nurse. Name: Lauretta Grill and Minette Brine      Last Filed Vitals    10/02/17 1529   BP: 95/59   Pulse: 83   Resp: 19   Temp:    SpO2: (!) 20%

## 2017-10-02 NOTE — Invasive Procedure Plan of Care (Signed)
[facility]  OR SURGICAL PROCEDURE                            Patient Name: Shane Pearson  [facility] [address]                                                           DOB: 14-Jan-1961     Please read this form or have someone read it to you.   It's important to understand all parts of this form. If something isn't clear, ask [place] to explain.   When you sign it, that means you understand the form and give [place] permission to do this surgery or procedure.    I agree for Lyman Bishop along with any assistants* they may choose, to treat the following   condition(s): Need for moderate sedation during procedure/minor surgery.   By doing this surgery or procedure on me: Establishment of moderate sedation.   This is also known as:    Laterality: Not applicable          *if you'd like a list of the assistants, please ask. We can give that to you.    1.      The care provider has explained my condition to me. They have told me how the procedure can help me. They have told me about other ways of treating my condition. I understand the care provider cannot guarantee the result of the procedure. If I don't have this procedure, my other choices are: Not undergoing procedure, undergoing procedure without sedation.    2.      The care provider has told me the risks (problems that can happen) of the procedure. I understand there may be unwanted results. The risks that are related to this procedure include: Excessive sedation, which may require special procedures to keep you safe, including placement of a breathing tube.  Nausea, vomiting, problems with heart rate or blood pressure, breathing difficilties, very rarely death. Occasionally, vivid dreams or agitation requiring additional medication.    3.      I understand that during the procedure, my care provider may find a condition that we didn't know about before the treatment started. Therefore, I agree that my care  provider can perform any other treatment which they think is necessary and available.    4.      I understand the care provider may remove tissue, body parts, or materials during this procedure. These materials may be used to help with my diagnosis and treatment. They might also be used for teaching purposes or for research studies that I have separately agreed to participate in. Otherwise they will be disposed of as required by law.    5.     My care provider might want a representative from a [place] to be there during my procedure. I understand that person works for:           The ways they might help my care provider during my procedure include:               6.     Here are my decisions about receiving blood, blood products, or  tissues. I understand my decisions cover the time before, during and after my procedure, my treatment, and my time in the hospital. After my procedure, if my condition changes a lot, my care provider will talk with me again about receiving blood or blood products. At that time, my care provider might need me to review and sign another consent form, about getting or refusing blood.    I understand that the blood is from the community blood supply. Volunteers donated the blood, the volunteers were screened for health problems. The blood was examined with very sensitive and accurate tests to look for hepatitis, HIV/AIDS, and other diseases. Before I receive blood, it is tested again to make sure it is the correct type.    My chances of getting a sickness from blood products are small. But no transfusion is 100% safe. I understand that my care provider feels the good I will receive from the blood is greater than the chances of something going wrong. My care provider has answered my questions about blood products.        My decision           about blood or      blood products                   My decision     about tissue    implants                    I understand this  form.       My care provider or his/her assistants have explained:        What I am having done and why I need it.     What other choices I can make instead of having this done.     The benefits and possible risks (problems) to me of having this done.     The benefits and possible risks (problems) to me of receiving transplants, blood, or blood products.     There is no guarantee of the results     The care provider may not stay with me the entire time that I am in the operating or procedure room. My provider has explained how this may affect my procedure. My provider has answered my questions about this.         I give my permission for this surgery or procedure.                                                                                                                                   My signature  (or parent or other person authorized to sign for you, if you are unable to sign for yourself or if you are under 72 years old)  Date                          Time   Electronic Signatures will display at the bottom of the consent form.    Care provider's statement: I have discussed the planned procedure, including the possibility for transfusion of blood products or receipt of tissue as necessary; expected benefits; the possible complications and risks; and possible alternatives and their benefits and risks with the patients or the patient's surrogate. In my opinion, the patient or the patient's surrogate understands the proposed procedure, its risks, benefits and alternatives.                          Lyman Bishop, MD                                         (Care providers signature)                                                                                                                                          (Printed name and title of care provider)        10/02/2017         Date        2:01 PM        Time

## 2017-10-02 NOTE — Preop H&P (Signed)
OUTPATIENT  Chief Complaint: malfunction AVF    History of Present Illness:  HPI    Past Medical History:   Diagnosis Date    Arthritis     CHF (congestive heart failure)     Colon polyp     Dialysis patient     ESRD (end stage renal disease)     Hypercholesterolemia     Hypotension     Thyroid disease     hypo     Past Surgical History:   Procedure Laterality Date    Right inguinal hernia repair       Family History   Problem Relation Age of Onset    Diabetes Mother     Asthma Mother     Coronary artery disease Father      Social History     Social History    Marital status: Married     Spouse name: N/A    Number of children: N/A    Years of education: N/A     Social History Main Topics    Smoking status: Former Smoker     Packs/day: 0.30     Years: 15.00     Types: Cigarettes     Quit date: 08/02/1992    Smokeless tobacco: Never Used    Alcohol use No    Drug use: No    Sexual activity: Not Asked     Other Topics Concern    None     Social History Narrative    None       Allergies:   Allergies   Allergen Reactions    Penicillins Swelling     Face    Coconut Flavor Nausea And Vomiting    Amoxicillin Rash    Baclofen Other (See Comments)     confusion    No Known Latex Allergy        Current Outpatient Prescriptions   Medication    acetaminophen (TYLENOL) 325 MG tablet    pantoprazole (PROTONIX) 40 MG EC tablet    aspirin 81 MG EC tablet    midodrine (PROAMATINE) 10 MG tablet    fluticasone (FLONASE) 50 MCG/ACT nasal spray    atorvastatin (LIPITOR) 10 MG tablet    fludrocortisone (FLORINEF) 0.1 mg tablet    b complex-vitamin c-folic acid (NEPHRO-VITE) tablet    calcium-vitamin D (OSCAL-500) 500-200 MG-UNIT per tablet    cinacalcet (SENSIPAR) 30 MG tablet    omeprazole (PRILOSEC) 20 MG capsule    minocycline (MINOCIN) 100 MG capsule    Omega-3 Fatty Acids (FISH OIL) 1200 MG CPDR     No current facility-administered medications for this encounter.         Review of Systems:    ROS    Last Nursing documented pain:  0-10 Scale: 0 (10/02/17 1343)      Patient Vitals for the past 24 hrs:   BP Temp Pulse Resp SpO2 Height Weight   10/02/17 1343 (!) 84/47 36.2 C (97.2 F) 83 17 93 % 162.6 cm (5\' 4" ) 84 kg (185 lb 3 oz)            Physical Exam    Lab Results:   All labs in the last 72 hours:No results found for this or any previous visit (from the past 72 hour(s)).    Radiology impressions (last 3 days):  No results found.    Currently Active/Followed Hospital Problems:  There are no active hospital problems to display for this patient.  Assessment:     Plan: catheter palcement or change    Author: Lyman Bishop, MD  Note created: 10/02/2017  at: 2:02 PM

## 2017-10-02 NOTE — Discharge Instructions (Signed)
Tunneled Central Line Discharge Instructions   10/02/2017  3:55 PM    Provider performing test/procedure: Dr. Lyman Bishop    Patient received medication? Yes. You have been given medicine, Fentanyl, that may make you sleepy. Do not drive, operate  heavy machinery, drink alcoholic beverages, make important personal or business decisions, or sign legal documents until the next day.     You must be with a responsible adult the first night after your procedure.   You can eat your usual diet.   Rest for the remainder of the day. Return to normal activities in 24 hours.   You may not shower or get the dressing wet.   Do not remove the bandage from the catheter site. If bleeding occurs at the catheter site, apply firm pressure to the site for 10 minutes. If the bandage is soaked with blood, apply pressure and call your doctor immediately.   Resume your usual medications.   If you take warfarin (Coumadin), check with your primary doctor before restarting this medicine.   Do not take aspirin (including Anacin), Ibuprofen (Advil,Motrin,Nuprin) or naproxen sodium (Aleve) for the next 24 to 48 hours unless you directed by your primary doctor.   Acetaminophen (Tylenol) is the medication of choice if you have pain.     Call Your Doctor IF:   You have signs of infection at the catheter site: fever, chills, severe pain or swelling, foul smelling drainage.   If your catheter gets pulled out.    Please call [place] at [facility] for questions or concerns regarding your procedure.  Monday-Friday from 8am-5pm call [phone]. After hours and weekends you may call [phone] and speak to the Resident on-call. The Emergency Department is open 24 hours a day if emergency treatment is required.

## 2017-10-02 NOTE — Procedures (Signed)
Procedure Report           Time out documentation completed prior to procedure:  Yes    Indications/Pre-Procedure diagnosis:  Malfunction dialysis catheter.    Procedure performed: IR tunneled central line/portacath/mediport check    Guide Wire Removed:Yes    Findings/Procedure Summary (detailed report located in the Image tab):   A change of dialysis catheter over a wire and venoplasty of the SVC with 12 and 14 mm balloons.    Complications:  none    Condition:  good    EBL: 20 mL    Specimens:  N/A    Operators: Dr. Lyman Bishop    Disposition:  stable    Post-Procedure Diagnosis: same      Lyman Bishop, MD  10/02/2017  3:32 PM

## 2017-10-02 NOTE — Invasive Procedure Plan of Care (Signed)
[facility]  OR SURGICAL PROCEDURE                            Patient Name: Shane Pearson  [facility] [address]                                                           DOB: Jan 28, 1961     Please read this form or have someone read it to you.   It's important to understand all parts of this form. If something isn't clear, ask [place] to explain.   When you sign it, that means you understand the form and give [place] permission to do this surgery or procedure.    I agree for Lyman Bishop along with any assistants* they may choose, to treat the following   condition(s): Need for central vascular access.   By doing this surgery or procedure on me: Insertion of central venous catheter or a change over a wire of existing catheter.   This is also known as:    Laterality:          *if you'd like a list of the assistants, please ask. We can give that to you.    1.      The care provider has explained my condition to me. They have told me how the procedure can help me. They have told me about other ways of treating my condition. I understand the care provider cannot guarantee the result of the procedure. If I don't have this procedure, my other choices are: Attempting alternative monitoring techniques and administration of fluids and medications through a peripheral IV.    2.      The care provider has told me the risks (problems that can happen) of the procedure. I understand there may be unwanted results. The risks that are related to this procedure include: Bleeding, injury to vessel, infection, blood clots, need for repeated procedures, abnormal heart rhythyms, collapsed lung. In rare circumstances this procedure may result in death.     3.      I understand that during the procedure, my care provider may find a condition that we didn't know about before the treatment started. Therefore, I agree that my care provider can perform any other treatment which they  think is necessary and available.    4.      I understand the care provider may remove tissue, body parts, or materials during this procedure. These materials may be used to help with my diagnosis and treatment. They might also be used for teaching purposes or for research studies that I have separately agreed to participate in. Otherwise they will be disposed of as required by law.    5.     My care provider might want a representative from a [place] to be there during my procedure. I understand that person works for:           The ways they might help my care provider during my procedure include:               6.     Here are my decisions about receiving blood, blood products, or tissues.  I understand my decisions cover the time before, during and after my procedure, my treatment, and my time in the hospital. After my procedure, if my condition changes a lot, my care provider will talk with me again about receiving blood or blood products. At that time, my care provider might need me to review and sign another consent form, about getting or refusing blood.    I understand that the blood is from the community blood supply. Volunteers donated the blood, the volunteers were screened for health problems. The blood was examined with very sensitive and accurate tests to look for hepatitis, HIV/AIDS, and other diseases. Before I receive blood, it is tested again to make sure it is the correct type.    My chances of getting a sickness from blood products are small. But no transfusion is 100% safe. I understand that my care provider feels the good I will receive from the blood is greater than the chances of something going wrong. My care provider has answered my questions about blood products.        My decision           about blood or      blood products      Not applicable.             My decision     about tissue    implants      No, I do not agree to recieve tissue implants.               I  understand this form.       My care provider or his/her assistants have explained:        What I am having done and why I need it.     What other choices I can make instead of having this done.     The benefits and possible risks (problems) to me of having this done.     The benefits and possible risks (problems) to me of receiving transplants, blood, or blood products.     There is no guarantee of the results     The care provider may not stay with me the entire time that I am in the operating or procedure room. My provider has explained how this may affect my procedure. My provider has answered my questions about this.         I give my permission for this surgery or procedure.                                                                                                                                   My signature  (or parent or other person authorized to sign for you, if you are unable to sign for yourself or if you are under 59 years old)  Date                          Time   Electronic Signatures will display at the bottom of the consent form.    Care provider's statement: I have discussed the planned procedure, including the possibility for transfusion of blood products or receipt of tissue as necessary; expected benefits; the possible complications and risks; and possible alternatives and their benefits and risks with the patients or the patient's surrogate. In my opinion, the patient or the patient's surrogate understands the proposed procedure, its risks, benefits and alternatives.                          Lyman Bishop, MD                                         (Care providers signature)                                                                                                                                          (Printed name and title of care provider)        10/02/2017         Date        2:00 PM        Time

## 2017-10-21 LAB — UNMAPPED LAB RESULTS
Hematocrit (HT): 42.7 % — NL (ref 40.0–52.0)
Hemoglobin (HGB) (HT): 13.8 g/dL — NL (ref 13.0–17.5)
MCHC (HT): 32.3 g/dL — NL (ref 32.0–36.0)
MCV (HT): 83.4 FL — NL (ref 81.0–99.0)
Mean Corpuscular Hemoglobin (MCH) (HT): 27 pg — NL (ref 26.0–34.0)
Platelets (HT): 321 10 3/uL — NL (ref 140–400)
RBC (HT): 5.12 10 6/uL — NL (ref 4.20–5.90)
RDW (HT): 17 % — ABNORMAL HIGH (ref 11.5–15.0)
WBC (HT): 8.7 10 3/uL — NL (ref 4.0–10.8)

## 2017-10-29 ENCOUNTER — Other Ambulatory Visit: Payer: Self-pay | Admitting: Transplant

## 2017-10-29 DIAGNOSIS — N186 End stage renal disease: Secondary | ICD-10-CM

## 2017-10-29 DIAGNOSIS — Z992 Dependence on renal dialysis: Secondary | ICD-10-CM

## 2017-11-05 ENCOUNTER — Encounter: Payer: Self-pay | Admitting: Emergency Medicine

## 2017-11-05 ENCOUNTER — Other Ambulatory Visit: Payer: Self-pay | Admitting: Cardiology

## 2017-11-05 ENCOUNTER — Emergency Department: Payer: Medicare Other

## 2017-11-05 ENCOUNTER — Observation Stay
Admission: EM | Admit: 2017-11-05 | Discharge: 2017-11-06 | Discharge status: Against Medical Advice | Payer: Medicare Other | Source: Ambulatory Visit | Attending: Radiology | Admitting: Radiology

## 2017-11-05 DIAGNOSIS — T82898A Other specified complication of vascular prosthetic devices, implants and grafts, initial encounter: Principal | ICD-10-CM | POA: Insufficient documentation

## 2017-11-05 DIAGNOSIS — N250 Renal osteodystrophy: Secondary | ICD-10-CM | POA: Insufficient documentation

## 2017-11-05 DIAGNOSIS — Y998 Other external cause status: Secondary | ICD-10-CM | POA: Insufficient documentation

## 2017-11-05 DIAGNOSIS — E875 Hyperkalemia: Secondary | ICD-10-CM | POA: Insufficient documentation

## 2017-11-05 DIAGNOSIS — R42 Dizziness and giddiness: Secondary | ICD-10-CM

## 2017-11-05 DIAGNOSIS — K219 Gastro-esophageal reflux disease without esophagitis: Secondary | ICD-10-CM | POA: Insufficient documentation

## 2017-11-05 DIAGNOSIS — E8770 Fluid overload, unspecified: Secondary | ICD-10-CM

## 2017-11-05 DIAGNOSIS — Y9389 Activity, other specified: Secondary | ICD-10-CM | POA: Insufficient documentation

## 2017-11-05 DIAGNOSIS — K559 Vascular disorder of intestine, unspecified: Secondary | ICD-10-CM | POA: Insufficient documentation

## 2017-11-05 DIAGNOSIS — E785 Hyperlipidemia, unspecified: Secondary | ICD-10-CM | POA: Insufficient documentation

## 2017-11-05 DIAGNOSIS — L709 Acne, unspecified: Secondary | ICD-10-CM | POA: Insufficient documentation

## 2017-11-05 DIAGNOSIS — R5383 Other fatigue: Secondary | ICD-10-CM

## 2017-11-05 DIAGNOSIS — T8249XA Other complication of vascular dialysis catheter, initial encounter: Secondary | ICD-10-CM

## 2017-11-05 DIAGNOSIS — Z992 Dependence on renal dialysis: Secondary | ICD-10-CM | POA: Insufficient documentation

## 2017-11-05 DIAGNOSIS — I509 Heart failure, unspecified: Secondary | ICD-10-CM | POA: Insufficient documentation

## 2017-11-05 DIAGNOSIS — I959 Hypotension, unspecified: Secondary | ICD-10-CM | POA: Insufficient documentation

## 2017-11-05 DIAGNOSIS — Y9289 Other specified places as the place of occurrence of the external cause: Secondary | ICD-10-CM | POA: Insufficient documentation

## 2017-11-05 DIAGNOSIS — I9589 Other hypotension: Secondary | ICD-10-CM | POA: Insufficient documentation

## 2017-11-05 DIAGNOSIS — I1311 Hypertensive heart and chronic kidney disease without heart failure, with stage 5 chronic kidney disease, or end stage renal disease: Secondary | ICD-10-CM | POA: Insufficient documentation

## 2017-11-05 DIAGNOSIS — Z5329 Procedure and treatment not carried out because of patient's decision for other reasons: Secondary | ICD-10-CM | POA: Insufficient documentation

## 2017-11-05 DIAGNOSIS — Z87891 Personal history of nicotine dependence: Secondary | ICD-10-CM | POA: Insufficient documentation

## 2017-11-05 DIAGNOSIS — E872 Acidosis: Secondary | ICD-10-CM | POA: Insufficient documentation

## 2017-11-05 DIAGNOSIS — X58XXXA Exposure to other specified factors, initial encounter: Secondary | ICD-10-CM | POA: Insufficient documentation

## 2017-11-05 DIAGNOSIS — N186 End stage renal disease: Secondary | ICD-10-CM | POA: Insufficient documentation

## 2017-11-05 DIAGNOSIS — I499 Cardiac arrhythmia, unspecified: Secondary | ICD-10-CM

## 2017-11-05 DIAGNOSIS — Y831 Surgical operation with implant of artificial internal device as the cause of abnormal reaction of the patient, or of later complication, without mention of misadventure at the time of the procedure: Secondary | ICD-10-CM | POA: Insufficient documentation

## 2017-11-05 HISTORY — DX: Gastro-esophageal reflux disease without esophagitis: K21.9

## 2017-11-05 HISTORY — DX: Vascular disorder of intestine, unspecified: K55.9

## 2017-11-05 LAB — PLASMA PROF 7 (ED ONLY)
Anion Gap,PL: 26 — ABNORMAL HIGH (ref 7–16)
Anion Gap,PL: 24 — ABNORMAL HIGH (ref 7–16)
CO2,Plasma: 18 mmol/L — ABNORMAL LOW (ref 20–28)
CO2,Plasma: 21 mmol/L (ref 20–28)
Chloride,Plasma: 96 mmol/L (ref 96–108)
Chloride,Plasma: 96 mmol/L (ref 96–108)
Creatinine: 17.23 mg/dL — ABNORMAL HIGH (ref 0.67–1.17)
Creatinine: 17.73 mg/dL — ABNORMAL HIGH (ref 0.67–1.17)
GFR,Black: 3 * — AB
GFR,Black: 3 * — AB
GFR,Caucasian: 3 * — AB
GFR,Caucasian: 3 * — AB
Glucose,Plasma: 76 mg/dL (ref 60–99)
Glucose,Plasma: 72 mg/dL (ref 60–99)
Potassium,Plasma: 6.4 mmol/L (ref 3.4–4.7)
Potassium,Plasma: 5.2 mmol/L — ABNORMAL HIGH (ref 3.4–4.7)
Sodium,Plasma: 140 mmol/L (ref 133–145)
Sodium,Plasma: 141 mmol/L (ref 133–145)
UN,Plasma: 85 mg/dL — ABNORMAL HIGH (ref 6–20)
UN,Plasma: 87 mg/dL — ABNORMAL HIGH (ref 6–20)

## 2017-11-05 LAB — CBC AND DIFFERENTIAL
Baso # K/uL: 0 10*3/uL (ref 0.0–0.1)
Basophil %: 0.6 %
Eos # K/uL: 0.3 10*3/uL (ref 0.0–0.5)
Eosinophil %: 4 %
Hematocrit: 47 % (ref 40–51)
Hemoglobin: 13.5 g/dL — ABNORMAL LOW (ref 13.7–17.5)
IMM Granulocytes #: 0.1 10*3/uL
IMM Granulocytes: 1 %
Lymph # K/uL: 1.7 10*3/uL (ref 1.3–3.6)
Lymphocyte %: 25 %
MCH: 27 pg/cell (ref 26–32)
MCHC: 29 g/dL — ABNORMAL LOW (ref 32–37)
MCV: 92 fL (ref 79–92)
Mono # K/uL: 0.6 10*3/uL (ref 0.3–0.8)
Monocyte %: 9.1 %
Neut # K/uL: 4.2 10*3/uL (ref 1.8–5.4)
Nucl RBC # K/uL: 0 10*3/uL (ref 0.0–0.0)
Nucl RBC %: 0 /100 WBC (ref 0.0–0.2)
Platelets: 235 10*3/uL (ref 150–330)
RBC: 5.1 MIL/uL (ref 4.6–6.1)
RDW: 19.2 % — ABNORMAL HIGH (ref 11.6–14.4)
Seg Neut %: 60.3 %
WBC: 6.9 10*3/uL (ref 4.2–9.1)

## 2017-11-05 LAB — HOLD GREEN WITH GEL

## 2017-11-05 LAB — BASIC METABOLIC PANEL
Anion Gap: 28 — ABNORMAL HIGH (ref 7–16)
CO2: 17 mmol/L — ABNORMAL LOW (ref 20–28)
Calcium: 8.8 mg/dL (ref 8.6–10.2)
Chloride: 96 mmol/L (ref 96–108)
Creatinine: 16.53 mg/dL — ABNORMAL HIGH (ref 0.67–1.17)
GFR,Black: 3 * — AB
GFR,Caucasian: 3 * — AB
Glucose: 76 mg/dL (ref 60–99)
Lab: 82 mg/dL — ABNORMAL HIGH (ref 6–20)
Potassium: 6.1 mmol/L — ABNORMAL HIGH (ref 3.3–5.1)
Sodium: 141 mmol/L (ref 133–145)

## 2017-11-05 LAB — POCT GLUCOSE
Glucose POCT: 76 mg/dL (ref 60–99)
Glucose POCT: 128 mg/dL — ABNORMAL HIGH (ref 60–99)
Glucose POCT: 107 mg/dL — ABNORMAL HIGH (ref 60–99)

## 2017-11-05 LAB — HM HIV SCREENING OFFERED

## 2017-11-05 LAB — HOLD LAVENDER

## 2017-11-05 LAB — HOLD SST

## 2017-11-05 LAB — PHOSPHORUS: Phosphorus: 3.3 mg/dL (ref 2.7–4.5)

## 2017-11-05 MED ORDER — FLUDROCORTISONE ACETATE 100 MCG PO TABS *I*
0.1000 mg | ORAL_TABLET | Freq: Three times a day (TID) | ORAL | Status: DC
Start: 2017-11-05 — End: 2017-11-07
  Administered 2017-11-05 – 2017-11-06 (×3): 0.1 mg via ORAL
  Filled 2017-11-05 (×6): qty 1

## 2017-11-05 MED ORDER — DEXTROSE 50 % IV SOLN *I*
25.0000 g | Freq: Once | INTRAVENOUS | Status: DC
Start: 2017-11-05 — End: 2017-11-05

## 2017-11-05 MED ORDER — CINACALCET HCL 60 MG PO TABS *I*
90.0000 mg | ORAL_TABLET | Freq: Every day | ORAL | Status: DC
Start: 2017-11-06 — End: 2017-11-07
  Administered 2017-11-06: 90 mg via ORAL
  Filled 2017-11-05 (×2): qty 1

## 2017-11-05 MED ORDER — ACETAMINOPHEN 325 MG PO TABS *I*
650.0000 mg | ORAL_TABLET | Freq: Four times a day (QID) | ORAL | Status: DC | PRN
Start: 2017-11-05 — End: 2017-11-07

## 2017-11-05 MED ORDER — DEXTROSE 50 % IV SOLN *I*
25.0000 g | Freq: Once | INTRAVENOUS | Status: AC
Start: 2017-11-05 — End: 2017-11-05
  Administered 2017-11-05: 25 g via INTRAVENOUS
  Filled 2017-11-05: qty 50

## 2017-11-05 MED ORDER — DEXTROSE 50 % IV SOLN *I*
25.0000 g | INTRAVENOUS | Status: AC | PRN
Start: 2017-11-05 — End: 2017-11-05

## 2017-11-05 MED ORDER — HEPARIN SODIUM (PORCINE) 1000 UNIT/ML IJ SOLN *WRAPPED*
0.0000 mL | Status: DC | PRN
Start: 2017-11-05 — End: 2017-11-07

## 2017-11-05 MED ORDER — ATORVASTATIN CALCIUM 10 MG PO TABS *I*
20.0000 mg | ORAL_TABLET | Freq: Every day | ORAL | Status: DC
Start: 2017-11-06 — End: 2017-11-07
  Administered 2017-11-06: 20 mg via ORAL
  Filled 2017-11-05: qty 2

## 2017-11-05 MED ORDER — ASPIRIN 81 MG PO TBEC *I*
81.0000 mg | DELAYED_RELEASE_TABLET | Freq: Every day | ORAL | Status: DC
Start: 2017-11-06 — End: 2017-11-07
  Administered 2017-11-06: 81 mg via ORAL
  Filled 2017-11-05 (×2): qty 1

## 2017-11-05 MED ORDER — NEPHRO-VITE 0.8 MG PO TABS *I*
1.0000 | ORAL_TABLET | Freq: Every day | ORAL | Status: DC
Start: 2017-11-06 — End: 2017-11-07
  Administered 2017-11-06: 1 via ORAL
  Filled 2017-11-05 (×2): qty 1

## 2017-11-05 MED ORDER — SEVELAMER CARBONATE 800 MG PO TABS *I*
800.0000 mg | ORAL_TABLET | Freq: Every day | ORAL | Status: DC
Start: 2017-11-06 — End: 2017-11-07
  Administered 2017-11-06: 800 mg via ORAL
  Filled 2017-11-05: qty 1

## 2017-11-05 MED ORDER — CALCIUM CARBONATE ANTACID 500 MG PO CHEW *I*
500.0000 mg | CHEWABLE_TABLET | Freq: Two times a day (BID) | ORAL | Status: DC
Start: 2017-11-06 — End: 2017-11-07
  Filled 2017-11-05: qty 1

## 2017-11-05 MED ORDER — CALCIUM GLUCONATE 9.4 MEQ (2,000 MG) IN NS 110 ML *I*
9.4000 meq | Freq: Once | INTRAVENOUS | Status: AC
Start: 2017-11-05 — End: 2017-11-05
  Administered 2017-11-05: 9.4 meq via INTRAVENOUS
  Filled 2017-11-05: qty 100

## 2017-11-05 MED ORDER — MIDODRINE HCL 5 MG PO TABS *I*
10.0000 mg | ORAL_TABLET | Freq: Three times a day (TID) | ORAL | Status: DC
Start: 2017-11-05 — End: 2017-11-07
  Administered 2017-11-05 – 2017-11-06 (×3): 10 mg via ORAL
  Filled 2017-11-05 (×6): qty 2

## 2017-11-05 MED ORDER — INSULIN REGULAR HUMAN 100 UNIT/ML IJ SOLN *I*
10.0000 [IU] | Freq: Once | INTRAMUSCULAR | Status: AC
Start: 2017-11-05 — End: 2017-11-05
  Administered 2017-11-05: 10 [IU] via INTRAVENOUS

## 2017-11-05 MED ORDER — CALCITRIOL 0.25 MCG PO CAPS *I*
1.0000 ug | ORAL_CAPSULE | ORAL | Status: DC
Start: 2017-11-05 — End: 2017-11-07
  Filled 2017-11-05 (×2): qty 4

## 2017-11-05 MED ORDER — FLUTICASONE PROPIONATE 50 MCG/ACT NA SUSP *I*
1.0000 | Freq: Every day | NASAL | Status: DC
Start: 2017-11-06 — End: 2017-11-07
  Administered 2017-11-06: 1 via NASAL
  Filled 2017-11-05: qty 16

## 2017-11-05 MED ORDER — MINOCYCLINE HCL 50 MG PO CAPS *I*
50.0000 mg | ORAL_CAPSULE | Freq: Two times a day (BID) | ORAL | Status: DC
Start: 2017-11-05 — End: 2017-11-07
  Administered 2017-11-05 – 2017-11-06 (×3): 50 mg via ORAL
  Filled 2017-11-05 (×4): qty 1

## 2017-11-05 MED ORDER — OMEPRAZOLE 40 MG PO CPDR *I*
40.0000 mg | DELAYED_RELEASE_CAPSULE | Freq: Every morning | ORAL | Status: DC
Start: 2017-11-06 — End: 2017-11-07
  Administered 2017-11-06: 40 mg via ORAL
  Filled 2017-11-05 (×2): qty 1

## 2017-11-05 NOTE — ED Obs Notes (Signed)
ED OBSERVATION ADMISSION NOTE    Patient seen by me today, 11/05/2017 at 9:38 PM    Current patient status: Observation    History     Chief Complaint   Patient presents with    Dialysis Catheter issue     57 year old male with a hx of ESRD on dialysis (T/Th/Sat), chronic hypotension, GERD, HLD who presented to the ED with the chief complaint of leaking from HD cath line.  The patient had his HD cath last changed on 10/02/17.  He states that about 2 weeks ago he had an episode of blood leaking from his cath.  He states that resolved and other than that the catheter had been functioning normally.  Today he went for a routine dialysis treatment and was found to have leaking from the line when it was attempted to be flushed.  He did not undergo dialysis today and was sent to the ED.  He is without any complaints.  He states his last dialysis was 3 days ago and was normal.  On arrival to the ED, his K was found to be 6.1, treated with insulin and calcium gluconate.  On repeat K was 5.2.  He was without EKG changes.            Past Medical History:   Diagnosis Date    Arthritis     CHF (congestive heart failure)     Colon polyp     Dialysis patient     ESRD (end stage renal disease)     GERD (gastroesophageal reflux disease)     Hypercholesterolemia     Hypotension     Ischemic colitis     Thyroid disease     hypo       Past Surgical History:   Procedure Laterality Date    Right inguinal hernia repair         Family History   Problem Relation Age of Onset    Diabetes Mother     Asthma Mother     Coronary artery disease Father        Social History      reports that he quit smoking about 25 years ago. His smoking use included Cigarettes. He has a 4.50 pack-year smoking history. He has never used smokeless tobacco. He reports that he does not drink alcohol or use drugs. His sexual activity history is not on file.    Living Situation     Questions Responses    Patient lives with Spouse    Homeless     Caregiver  for other family member     External Services     Employment Employed    Domestic Violence Risk           Review of Systems   Review of Systems   Constitutional: Negative for chills and fever.   HENT: Negative for voice change.    Eyes: Negative for redness.   Respiratory: Negative for shortness of breath.    Cardiovascular: Negative for chest pain.   Gastrointestinal: Negative for abdominal pain, nausea and vomiting.   Musculoskeletal: Negative for gait problem.   Skin: Negative for wound.   Allergic/Immunologic: Negative for immunocompromised state.   Neurological: Positive for light-headedness (this AM, reports from his chronic hypotension). Negative for syncope and facial asymmetry.   Psychiatric/Behavioral: Negative for confusion.       Physical Exam   BP 95/59 (BP Location: Right arm)    Pulse 64    Temp  36.4 C (97.5 F) (Temporal)    Resp 16    Ht 1.651 m (5\' 5" )    Wt 89 kg (196 lb 3.4 oz)    SpO2 98%    BMI 32.65 kg/m     Physical Exam   Constitutional: He is oriented to person, place, and time. He appears well-developed and well-nourished. No distress.   Sitting upright in bed, pleasant.   HENT:   Head: Normocephalic and atraumatic.   Right Ear: External ear normal.   Left Ear: External ear normal.   Eyes: Conjunctivae and EOM are normal.   Neck: Normal range of motion.   Cardiovascular: Normal rate.    Pulmonary/Chest: Effort normal. No respiratory distress.   HD cath in place in the left chest, dressing in place.  Dressing is clean/dry/intact.   Abdominal: He exhibits no distension.   Musculoskeletal: Normal range of motion. He exhibits no edema.   Neurological: He is alert and oriented to person, place, and time. No cranial nerve deficit.   Skin: Skin is warm and dry. No rash noted.   Psychiatric: He has a normal mood and affect. His behavior is normal. Judgment and thought content normal.       Tests    HRC:BULAGT sinus rhythm, no acute change    Labs:   All labs in the last 24 hours:   Recent  Results (from the past 24 hour(s))   HM HIV SCREENING OFFERED    Collection Time: 11/05/17 12:00 AM   Result Value Ref Range    HM HIV SCREENING OFFERED Declined    Hold green with gel    Collection Time: 11/05/17  1:12 PM   Result Value Ref Range    Hold Green (w/gel,spun) HOLD TUBE    Hold SST    Collection Time: 11/05/17  1:12 PM   Result Value Ref Range    Hold SST HOLD TUBE    Basic metabolic panel    Collection Time: 11/05/17  1:12 PM   Result Value Ref Range    Glucose 76 60 - 99 mg/dL    Sodium 141 133 - 145 mmol/L    Potassium 6.1 (H) 3.3 - 5.1 mmol/L    Chloride 96 96 - 108 mmol/L    CO2 17 (L) 20 - 28 mmol/L    Anion Gap 28 (H) 7 - 16    UN 82 (H) 6 - 20 mg/dL    Creatinine 16.53 (H) 0.67 - 1.17 mg/dL    GFR,Caucasian 3 (!) *    GFR,Black 3 (!) *    Calcium 8.8 8.6 - 10.2 mg/dL   Plasma profile 7 (ED only)    Collection Time: 11/05/17  1:12 PM   Result Value Ref Range    Chloride,Plasma 96 96 - 108 mmol/L    CO2,Plasma 18 (L) 20 - 28 mmol/L    Potassium,Plasma 6.4 (HH) 3.4 - 4.7 mmol/L    Sodium,Plasma 140 133 - 145 mmol/L    Anion Gap,PL 26 (H) 7 - 16    UN,Plasma 85 (H) 6 - 20 mg/dL    Creatinine 17.23 (H) 0.67 - 1.17 mg/dL    GFR,Caucasian 3 (!) *    GFR,Black 3 (!) *    Glucose,Plasma 76 60 - 99 mg/dL   Hold lavender    Collection Time: 11/05/17  1:15 PM   Result Value Ref Range    Hold Lav HOLD TUBES    CBC and differential    Collection Time: 11/05/17  1:15 PM   Result Value Ref Range    WBC 6.9 4.2 - 9.1 THOU/uL    RBC 5.1 4.6 - 6.1 MIL/uL    Hemoglobin 13.5 (L) 13.7 - 17.5 g/dL    Hematocrit 47 40 - 51 %    MCV 92 79 - 92 fL    MCH 27 26 - 32 pg/cell    MCHC 29 (L) 32 - 37 g/dL    RDW 19.2 (H) 11.6 - 14.4 %    Platelets 235 150 - 330 THOU/uL    Seg Neut % 60.3 %    Lymphocyte % 25.0 %    Monocyte % 9.1 %    Eosinophil % 4.0 %    Basophil % 0.6 %    Neut # K/uL 4.2 1.8 - 5.4 THOU/uL    Lymph # K/uL 1.7 1.3 - 3.6 THOU/uL    Mono # K/uL 0.6 0.3 - 0.8 THOU/uL    Eos # K/uL 0.3 0.0 - 0.5 THOU/uL     Baso # K/uL 0.0 0.0 - 0.1 THOU/uL    Nucl RBC % 0.0 0.0 - 0.2 /100 WBC    Nucl RBC # K/uL 0.0 0.0 - 0.0 THOU/uL    IMM Granulocytes # 0.1 THOU/uL    IMM Granulocytes 1.0 %   POCT glucose    Collection Time: 11/05/17  4:10 PM   Result Value Ref Range    Glucose POCT 76 60 - 99 mg/dL   POCT glucose    Collection Time: 11/05/17  4:51 PM   Result Value Ref Range    Glucose POCT 128 (H) 60 - 99 mg/dL   Plasma profile 7 (Adult ED only)    Collection Time: 11/05/17  6:11 PM   Result Value Ref Range    Chloride,Plasma 96 96 - 108 mmol/L    CO2,Plasma 21 20 - 28 mmol/L    Potassium,Plasma 5.2 (H) 3.4 - 4.7 mmol/L    Sodium,Plasma 141 133 - 145 mmol/L    Anion Gap,PL 24 (H) 7 - 16    UN,Plasma 87 (H) 6 - 20 mg/dL    Creatinine 17.73 (H) 0.67 - 1.17 mg/dL    GFR,Caucasian 3 (!) *    GFR,Black 3 (!) *    Glucose,Plasma 72 60 - 99 mg/dL        Imaging:*chest Standard Frontal And Lateral Views    Result Date: 11/05/2017  Hypoexpanded lungs. No focal consolidations. END OF IMPRESSION This exam was primarily reviewed and a report rendered by Max Sane MD and was signed by the signing physician for administrative purposes.    Medical Decision Making      Amount and/or Complexity of Data Reviewed  Clinical lab tests: ordered and reviewed  Tests in the radiology section of CPT: reviewed  Tests in the medicine section of CPT: reviewed  Review and summarize past medical records: yes  Independent visualization of images, tracings, or specimens: yes        Assessment:    57 y.o., male with a hx of ESRD on dialysis (T/Th/Sat), chronic hypotension, GERD, HLD placed in OBS after evaluation in the ED for leaking from HD cath line.  The patient had his HD cath last changed on 10/02/17.  He states that about 2 weeks ago he had an episode of blood leaking from his cath.  He states that resolved and other than that the catheter had been functioning normally.  Today he went for a routine dialysis treatment and was  found to have leaking from the  line when it was attempted to be flushed.  He did not undergo dialysis today and was sent to the ED.  He is without any complaints.  He states his last dialysis was 3 days ago and was normal.      On arrival to the ED, his K was found to be 6.1, treated with insulin and calcium gluconate.  On repeat K was 5.2.  He was without EKG changes.  Vitals remain stable.     Differential Diagnosis includes HD cath problem, hyperkalemia, electrolyte abnormality, arrhythmia                   Plan:   1. HD Cath Line Problem/Hyperkalemia   - Nephrology consulted, appreciate their recommendations   - IR cath replacement/exchange tomorrow   - NPO at midnight   - Plan for likely dialysis following cath replacement   - Repeat chemistries in AM, PT/INR (for IR procedure)  - Low potassium diet when able to eat   - Maintain on telemetry   - Continue calcium carbonate, sensipar, sevelamer, nephrovite    - Added on phosphorous   - Repeat glucose given insulin administration     2. Chronic Hypotension   - BP at baseline currently, will monitor   - Continue Midodrine, Florinef     3. HLD   - Continue Atorvastatin     4. GERD  - Continue PPI     5. Acne   - Continue Minocycline     Medically preferred DVT prophylaxis: None  Smoking Cessation: NA      Arryana Tolleson, PA     Milana Na, PA  11/05/17 2210

## 2017-11-05 NOTE — ED Notes (Signed)
ED RN INTERN ATTESTATION      I Catalina Lunger, RN (RN) reviewed the following charting information by the RN intern:  Sherlynn Stalls, RN.     Nursing Assessments  Medications  Plan of Care  Teaching   Notes    In the chart of Shane Pearson (57 y.o. male) and attest to the charting being accurate.

## 2017-11-05 NOTE — ED Notes (Signed)
11/05/17 2019   Observation Care   Observation care initiated  Yes   Patient has been verbally notified of their observation status Yes   Report Recieved From ED Nurse Yes

## 2017-11-05 NOTE — ED Notes (Signed)
Nephrology at the bedside.

## 2017-11-05 NOTE — ED Notes (Signed)
ED RN INTERN ATTESTATION       I Tori Milks, RN (RN) reviewed the following charting information by the RN intern: Cherre Huger    Nursing Assessments  Medications  Plan of Care  Teaching   Notes    In the chart of Shane Pearson (57 y.o. male) and attest to the charting being accurate.

## 2017-11-05 NOTE — ED Notes (Signed)
Pt arrived to the floor by stretcher. Ambulated independently. A&Ox4. Telemetry placed on patient. Oriented to unit and call bell. Pt made aware of shift change. Call bell within reach.

## 2017-11-05 NOTE — ED Notes (Signed)
Report Given To  Donnetta Simpers, RN      Descriptive Sentence / Reason for Admission   Pt went to dialysis this AM but was unable to get treatment due to "catheter crack".       Active Issues / Relevant Events   -Tues/Thurs/Sat dialysis (last tx Saturday)  -A&Ox4  -independent ambulation  -NPO meds ok  -K6.1-->IV insulin-->5.2        To Do List  -VS  -telemetry  -IR for dialysis catheter      Anticipatory Guidance / Discharge Planning  Obs for dialysis catheter placement

## 2017-11-05 NOTE — Consults (Addendum)
General Nephrology Consult Note:                                   Subjective/HPI     Shane Pearson is a 57 y.o. male with PMHx significant for ESRD on HD (per TTS schedule at [facility]), hypotension (on midodrine and fludrocortisone) who presented to the ED from his outpatient HD unit due to leaking from his left-sided HDTC line when it was initially flushed today. Patient's HDTC line was last replaced on 10/02/17 per interventional radiology, and he states that about two weeks ago it was noticed two times that his catheter was with leakage of small amounts of blood during dialysis. He states that he did not receive any dialysis today, and his last HD session was on 11/02/17 as scheduled. Patient reports that he has been on dialysis for about 19 years now, and he has never had permanent access created due to his hypotension. Patient states that his SBP is usually around 85 and he no longer makes any urine. Patient's serum K was found to be at 6.1 in the ED. The nephrology service was consulted for provision of inpatient dialysis.      Review of Systems:  Constitutional:  no fatigue, no fever, no chills  Eye:  no double vision, no visual disturbances  Ear/Nose/Mouth/Throat:  no nasal congestion, no sore throat  Respiratory:  no shortness of breath, no cough, no sputum production  Cardiovascular:  no chest pain, no palpitations, no peripheral edema  Gastrointestinal:  no nausea, no vomiting, no diarrhea, no constipation, no abdominal pain  Genitourinary:  no dysuria, no hematuria, no change in urine stream  Musculoskeletal:  no joint pain, no muscle pain  Integumentary:  no rash, no breakdown, no skin lesion  Neurologic: no confusion, no numbness, no tingling, occasional lightheadedness (chronic)      Medical History:    Past Medical History:   Diagnosis Date    Arthritis     CHF (congestive heart failure)     Colon polyp     Dialysis patient     ESRD (end stage renal disease)     Hypercholesterolemia      Hypotension     Thyroid disease     hypo       Past Surgical History:   Procedure Laterality Date    Right inguinal hernia repair         Family History   Problem Relation Age of Onset    Diabetes Mother     Asthma Mother     Coronary artery disease Father        Social History     Social History    Marital status: Married     Spouse name: N/A    Number of children: N/A    Years of education: N/A     Social History Main Topics    Smoking status: Former Smoker     Packs/day: 0.30     Years: 15.00     Types: Cigarettes     Quit date: 08/02/1992    Smokeless tobacco: Never Used    Alcohol use No    Drug use: No    Sexual activity: Not on file     Other Topics Concern    Not on file     Social History Narrative    No narrative on file         Allergies:  Allergies   Allergen Reactions    Penicillins Swelling     Face    Coconut Flavor Nausea And Vomiting    Amoxicillin Rash    Baclofen Other (See Comments)     confusion    No Known Latex Allergy          Prior to Admission Medications:    (Not in a hospital admission)      Objective      Physical Exam  Current Vitals Vitals Range (24 Hours)   BP 94/52 (BP Location: Left arm)    Pulse 60    Temp 36.5 C (97.7 F) (Temporal)    Resp 15    Ht 1.651 m (5\' 5" )    Wt 89 kg (196 lb 3.4 oz)    SpO2 100%    BMI 32.65 kg/m  BP: (91-94)/(50-52)   Temp:  [36 C (96.8 F)-36.5 C (97.7 F)]   Temp src: Temporal (08/27 1316)  Heart Rate:  [60-71]   Resp:  [15-18]   SpO2:  [98 %-100 %]   Height:  [165.1 cm (5\' 5" )]   Weight:  [89 kg (196 lb 3.4 oz)]      I/Os:  No intake/output data recorded.    Weight:  Last 4 Weights    11/05/17 0934   Weight: 89 kg (196 lb 3.4 oz)         General: NAD, pleasant, cooperative   Eye: EOMI  HEENT: mucus membranes are moist, there are no exudates  Neck: supple  Cardiovascular: +S1 & S2 sounds, regular rate and rhythm with no murmurs/rubs/gallops  Pulmonary: lungs are CTA bilaterally without wheezing, rales, or  rhonchi  Gastrointestinal: abdomen is soft, non-tender, non-distended  Ext: there is no LE edema  Neurological: awake, alert  Skin: clean and intact without rash to visible skin; left-sided HDTC in place      Recent Labs:     Recent Labs  Lab 11/05/17  1312   Sodium 141   Potassium 6.1*   Chloride 96   CO2 17*   Anion Gap 28*   UN 82*   Creatinine 16.53*   Glucose 76   Calcium 8.8     No results for input(s): WBC, HGB, HCT, PLT in the last 168 hours.  No results for input(s): INR, PTT in the last 168 hours.    No components found with this basename: APTT    No results for input(s): UAPP, UCOL, UAGLU, KETONESU, USG, UBLD, UAPH, UPRO, UNITR, ULEU, URBC, UWBC, UMUC, [place], [place], [place], BLOODUA, PHUA, PROTEIN, NITRITEUA, LEUKESTERASE, GLUCOSESIE, KETONESIEM, PUSG, BLOODSIEMEN, [place], PROTEINUA, PUNIT, LEUKOCYTESIE in the last 8760 hours.               No results for input(s): [place], [place], [place], [place], [place], UOSMO in the last 168 hours.       Recent Imaging:   *chest Standard Frontal And Lateral Views    Result Date: 11/05/2017  Hypoexpanded lungs. No focal consolidations. END OF IMPRESSION This exam was primarily reviewed and a report rendered by Max Sane MD and was signed by the signing physician for administrative purposes.         Current Inpatient Medications:  Scheduled Meds:  Continuous Infusions:  PRN Meds:.      Assessment     Shane Pearson is a 57 y.o. male with PMHx significant for ESRD on HD, hypotension who presented to the ED due to leaking from his left-sided HDTC line and hyperkalemia with serum  K at 6.1.      Plan     ESRD on HD per TTS schedule  - will plan for next HD treatment following replacement of access for dialysis with the following parameters:  F180 Na 138 K 1 Ca 2.5 HCO3 38 Qb: 400 ml/min Qd: 1.5X Qb UF 2-3 L as hemodynamics tolerate   - follow chemistries  - dose adjust medications to CrCL <10    CKD-MBD  - check phosphate  - cinacalcet 90 mg daily  - calcitriol 1 mcg  TTS    Anemia  - check CBC    Hyperkalemia  - most recent serum K at 6.1  - temporizing measures (calcium gluconate, D50, insulin) provided in ED  - for HD session following replacement of access for dialysis  - can consider administration of 25.2 g patiromer  - consider low potassium diet  - follow chemistries    Acid base  - metabolic acidosis likely 2/2 renal failure  - follow chemistries    Volume/blood pressure  - blood pressures are hypotensive with SBP in the 90s  - continue outpatient blood pressure medications    Access  - HDTC (for replacement of access for dialysis)      We will continue to follow along with you. Please page with any questions or concerns.    Attending addendum to follow.      Cletus Gash, DO  Nephrology Fellow, Pager: [phone]      Electronically Signed on 11/05/2017 at 3:02 PM     Nephrology Attending  Pt seen and examined. Chart and labs reviewed. Agree with above note by Dr. Nestor Ramp which is reflective of my input. Please see for complete details of HPI/PMH/Meds/All/SH/FH/ROS/PE/Labs/A/P.  Briefly pt is a 57 y/o man w/ ESRD on HD qTTS, who went to HD today and b/c of crack in HD line he was sent in. He feels okay overall  Vitals:    11/05/17 1316   BP: 94/52   Pulse: 60   Resp: 15   Temp: 36.5 C (97.7 F)   Weight:    Height:      HEENT - NC/AT  Mouth - no lesions  Neck- supple  Lungs - clear  CV - S1S2  Ext - no edema      Recent Labs  Lab 11/05/17  1312   Sodium 141   Potassium 6.1*   Chloride 96   CO2 17*   UN 82*   Creatinine 16.53*   Glucose 76   Calcium 8.8     No results for input(s): WBC, HGB, HCT, PLT in the last 168 hours.    A/P  1. Hyperkalemia - medical management - dextrose w/ insulin, calcium gluconate, patiromer 16.8 grams, NaHCO3  2. CKD6 - plan HD after line fixed  Rest of recommendations as in above note  Author: Marijo File, MD  as of: 11/05/2017  at: 5:11 PM

## 2017-11-05 NOTE — ED Notes (Signed)
Pt presents to the ED with a "cracked dialysis catheter". Pt was unable to get dialysis this morning. Pt states he received a full dialysis treatment on Saturday. Pt denies headache, chest pain, SOB, nausea, and dizziness. A&Ox4. Independent ambulation. Telemetry reading, HR 72. Call bell within reach. Will continue to monitor and treat pt per provider's orders.

## 2017-11-05 NOTE — ED Notes (Addendum)
Pt frustrated due to waiting on his dialysis catheter to be changed and wants an update from provider. Covering provider made aware. Provider states they do not VBG at this time.

## 2017-11-05 NOTE — ED Provider Notes (Addendum)
History     Chief Complaint   Patient presents with    Dialysis Catheter issue     57 y/o male with ESRD presenting with c/o of being unable to complete regular dialysis session today because of a cracked catheter line that seems to be leaking when dialysis was started. His dialysis sessions are TTHSat, and today is the first one that he has had to miss.     It appeared that he previously had to change his catheter last month (7.24) for a new HD cath that was done successfully. Patient notes that roughly 4 sessions ago his catheter had started to leak, and was a bit worrisome but that it soon resolved and he was able to undergo his dialysis sessions.     He denies any CP, SOB, HA, Weakness, N/V/D              Medical/Surgical/Family History     Past Medical History:   Diagnosis Date    Arthritis     CHF (congestive heart failure)     Colon polyp     Dialysis patient     ESRD (end stage renal disease)     Hypercholesterolemia     Hypotension     Thyroid disease     hypo        Patient Active Problem List   Diagnosis Code    ESRD (end stage renal disease) on dialysis N18.6, Z99.2            Past Surgical History:   Procedure Laterality Date    Right inguinal hernia repair       Family History   Problem Relation Age of Onset    Diabetes Mother     Asthma Mother     Coronary artery disease Father           Social History   Substance Use Topics    Smoking status: Former Smoker     Packs/day: 0.30     Years: 15.00     Types: Cigarettes     Quit date: 08/02/1992    Smokeless tobacco: Never Used    Alcohol use No     Living Situation     Questions Responses    Patient lives with Spouse    Homeless     Caregiver for other family member     External Services     Employment Employed    Domestic Violence Risk                 Review of Systems   Review of Systems   Constitutional: Negative for activity change and appetite change.   HENT: Negative for sinus pain and trouble swallowing.    Eyes: Negative for  visual disturbance.   Respiratory: Negative for chest tightness and shortness of breath.    Cardiovascular: Negative for chest pain.   Gastrointestinal: Negative for abdominal distention and abdominal pain.   Genitourinary: Negative for dysuria and flank pain.   Musculoskeletal: Negative for arthralgias and joint swelling.   Neurological: Positive for light-headedness. Negative for dizziness.   Psychiatric/Behavioral: Negative for agitation. The patient is not nervous/anxious.        Physical Exam     Triage Vitals  Triage Start: Start, (11/05/17 0932)   First Recorded BP: 91/50, Resp: 18, Temp: 36 C (96.8 F) Oxygen Therapy SpO2: 98 %, Heart Rate: 71, (11/05/17 0934)  .      Physical Exam   Constitutional: He is oriented to  person, place, and time. He appears well-developed and well-nourished. No distress.   HENT:   Head: Normocephalic and atraumatic.   Eyes: Pupils are equal, round, and reactive to light. EOM are normal.   Neck: Normal range of motion. Neck supple. No thyromegaly present.   Cardiovascular: Normal rate and regular rhythm.  Exam reveals no gallop and no friction rub.    No murmur heard.  Pulmonary/Chest: Breath sounds normal. No respiratory distress. He has no wheezes.   Abdominal: Soft. Bowel sounds are normal. There is no tenderness. There is no guarding.   Musculoskeletal: He exhibits no edema or deformity.   Neurological: He is alert and oriented to person, place, and time. No cranial nerve deficit or sensory deficit. Coordination normal.   Skin: Skin is warm and dry. No rash noted. He is not diaphoretic. No erythema.   Psychiatric: He has a normal mood and affect. Thought content normal.     Recent Results (from the past 24 hour(s))   HM HIV SCREENING OFFERED    Collection Time: 11/05/17 12:00 AM   Result Value Ref Range    HM HIV SCREENING OFFERED Declined    Hold green with gel    Collection Time: 11/05/17  1:12 PM   Result Value Ref Range    Hold Green (w/gel,spun) HOLD TUBE    Hold SST     Collection Time: 11/05/17  1:12 PM   Result Value Ref Range    Hold SST HOLD TUBE    Basic metabolic panel    Collection Time: 11/05/17  1:12 PM   Result Value Ref Range    Glucose 76 60 - 99 mg/dL    Sodium 141 133 - 145 mmol/L    Potassium 6.1 (H) 3.3 - 5.1 mmol/L    Chloride 96 96 - 108 mmol/L    CO2 17 (L) 20 - 28 mmol/L    Anion Gap 28 (H) 7 - 16    UN 82 (H) 6 - 20 mg/dL    Creatinine 16.53 (H) 0.67 - 1.17 mg/dL    GFR,Caucasian 3 (!) *    GFR,Black 3 (!) *    Calcium 8.8 8.6 - 10.2 mg/dL   Hold lavender    Collection Time: 11/05/17  1:15 PM   Result Value Ref Range    Hold Lav HOLD TUBES    POCT glucose    Collection Time: 11/05/17  4:10 PM   Result Value Ref Range    Glucose POCT 76 60 - 99 mg/dL         Medical Decision Making   Patient seen by me on:  11/05/2017    Assessment:  57 y/o male with ESRD presenting with c/o of being unable to complete regular dialysis session today because of a cracked catheter line that seems to be leaking when dialysis was started. No new EKG findings, nor third spacing on exam,nor chest xray     Differential diagnosis:  Electrolyte abnormalities     Plan:  Patient has hyperkalemia at 6.1 and increased (out of norm) at~ 16.8 After touching base with nephrology, and IR it was conceded that the patient must stay until he could get dialyzed.  Replacement of the dialysis catheter has been ordered and he is being medically managed (with dextrose and insulin) and temporized for his elevated K until then.     EKG Interpretation: normal sinus rythm, no ischemic changes    Independent review of: Existing labs, XRays    ED Course  and Disposition:  Dispo potentially to medicine.             Tanna Savoy, MD    Resident Attestation:    Patient seen by me on 11/05/2017.    History:  I reviewed this patient, reviewed the resident's note and agree.    Exam:  I examined this patient, reviewed the resident's note and agree.    Decision Making:  I discussed with the resident his/her  documented decision making and agree.      Author:  Lonna Duval, MD       Guilford Shi, MD  Resident  11/05/17 1630       Guilford Shi, MD  Resident  11/05/17 1638       [place]ford Shi, MD  Resident  11/05/17 1653       Lonna Duval, MD  11/06/17 1049

## 2017-11-05 NOTE — ED Triage Notes (Signed)
Went to dialysis this morning and they could not do his treatment because " my catheter is cracked"       Triage Note   Danielle Rankin, RN

## 2017-11-06 ENCOUNTER — Other Ambulatory Visit: Payer: Self-pay | Admitting: Cardiology

## 2017-11-06 ENCOUNTER — Other Ambulatory Visit: Payer: Self-pay | Admitting: Diagnostic Radiology

## 2017-11-06 ENCOUNTER — Observation Stay: Payer: Medicare Other

## 2017-11-06 DIAGNOSIS — T82898A Other specified complication of vascular prosthetic devices, implants and grafts, initial encounter: Secondary | ICD-10-CM

## 2017-11-06 DIAGNOSIS — Z4901 Encounter for fitting and adjustment of extracorporeal dialysis catheter: Secondary | ICD-10-CM

## 2017-11-06 LAB — CBC AND DIFFERENTIAL
Baso # K/uL: 0 10*3/uL (ref 0.0–0.1)
Basophil %: 0.2 %
Eos # K/uL: 0.3 10*3/uL (ref 0.0–0.5)
Eosinophil %: 3.8 %
Hematocrit: 40 % (ref 40–51)
Hemoglobin: 12.2 g/dL — ABNORMAL LOW (ref 13.7–17.5)
IMM Granulocytes #: 0 10*3/uL
IMM Granulocytes: 0.1 %
Lymph # K/uL: 1.6 10*3/uL (ref 1.3–3.6)
Lymphocyte %: 19.1 %
MCH: 27 pg/cell (ref 26–32)
MCHC: 30 g/dL — ABNORMAL LOW (ref 32–37)
MCV: 87 fL (ref 79–92)
Mono # K/uL: 0.8 10*3/uL (ref 0.3–0.8)
Monocyte %: 9.3 %
Neut # K/uL: 5.7 10*3/uL — ABNORMAL HIGH (ref 1.8–5.4)
Nucl RBC # K/uL: 0 10*3/uL (ref 0.0–0.0)
Nucl RBC %: 0 /100 WBC (ref 0.0–0.2)
Platelets: 261 10*3/uL (ref 150–330)
RBC: 4.6 MIL/uL (ref 4.6–6.1)
RDW: 18.3 % — ABNORMAL HIGH (ref 11.6–14.4)
Seg Neut %: 67.5 %
WBC: 8.4 10*3/uL (ref 4.2–9.1)

## 2017-11-06 LAB — PLASMA PROF 7 (ED ONLY)
Anion Gap,PL: 23 — ABNORMAL HIGH (ref 7–16)
Anion Gap,PL: 32 — ABNORMAL HIGH (ref 7–16)
CO2,Plasma: 22 mmol/L (ref 20–28)
CO2,Plasma: 16 mmol/L — ABNORMAL LOW (ref 20–28)
Chloride,Plasma: 97 mmol/L (ref 96–108)
Chloride,Plasma: 96 mmol/L (ref 96–108)
Creatinine: 19.08 mg/dL — ABNORMAL HIGH (ref 0.67–1.17)
Creatinine: 19.24 mg/dL — ABNORMAL HIGH (ref 0.67–1.17)
GFR,Black: 3 * — AB
GFR,Black: 3 * — AB
GFR,Caucasian: 2 * — AB
GFR,Caucasian: 2 * — AB
Glucose,Plasma: 72 mg/dL (ref 60–99)
Glucose,Plasma: 106 mg/dL — ABNORMAL HIGH (ref 60–99)
Potassium,Plasma: 6 mmol/L — ABNORMAL HIGH (ref 3.4–4.7)
Potassium,Plasma: 5.7 mmol/L — ABNORMAL HIGH (ref 3.4–4.7)
Sodium,Plasma: 142 mmol/L (ref 133–145)
Sodium,Plasma: 144 mmol/L (ref 133–145)
UN,Plasma: 101 mg/dL — ABNORMAL HIGH (ref 6–20)
UN,Plasma: 96 mg/dL — ABNORMAL HIGH (ref 6–20)

## 2017-11-06 LAB — HEPATITIS B PROF
HBV Core Ab: NEGATIVE
HBV S Ab Quant: 91.27 m[IU]/mL
HBV S Ab: POSITIVE
HBV S Ag: NEGATIVE

## 2017-11-06 LAB — PROTIME-INR
INR: 1.1 (ref 0.9–1.1)
Protime: 12.2 s (ref 10.0–12.9)

## 2017-11-06 LAB — POTASSIUM: Potassium: 5.7 mmol/L — ABNORMAL HIGH (ref 3.3–5.1)

## 2017-11-06 LAB — POCT GLUCOSE
Glucose POCT: 75 mg/dL (ref 60–99)
Glucose POCT: 58 mg/dL — ABNORMAL LOW (ref 60–99)
Glucose POCT: 98 mg/dL (ref 60–99)

## 2017-11-06 MED ORDER — HEPARIN SODIUM (PORCINE) 1000 UNIT/ML IJ SOLN *WRAPPED*
Status: AC
Start: 2017-11-06 — End: 2017-11-06
  Filled 2017-11-06: qty 10

## 2017-11-06 MED ORDER — HEPARIN SODIUM (PORCINE) 1000 UNIT/ML IJ SOLN *WRAPPED*
0.0000 mL | Status: DC | PRN
Start: 2017-11-06 — End: 2017-11-07

## 2017-11-06 MED ORDER — HEPARIN SODIUM (PORCINE) 1000 UNIT/ML IJ SOLN *WRAPPED*
Status: AC | PRN
Start: 2017-11-06 — End: 2017-11-06
  Administered 2017-11-06: 1.9 mL via INTRAVENOUS

## 2017-11-06 MED ORDER — DEXTROSE 50 % IV SOLN *I*
25.0000 g | Freq: Once | INTRAVENOUS | Status: AC
Start: 2017-11-06 — End: 2017-11-06
  Administered 2017-11-06: 25 g via INTRAVENOUS
  Filled 2017-11-06: qty 50

## 2017-11-06 MED ORDER — INSULIN REGULAR HUMAN 100 UNIT/ML IJ SOLN *I*
10.0000 [IU] | Freq: Once | INTRAMUSCULAR | Status: AC
Start: 2017-11-06 — End: 2017-11-06
  Administered 2017-11-06: 10 [IU] via INTRAVENOUS

## 2017-11-06 MED ORDER — MIDAZOLAM HCL 1 MG/ML IJ SOLN *I* WRAPPED
INTRAMUSCULAR | Status: AC | PRN
Start: 2017-11-06 — End: 2017-11-06
  Administered 2017-11-06: 1 mg via INTRAVENOUS

## 2017-11-06 MED ORDER — PATIROMER SORBITEX CALCIUM 25.2 G PO PACK *I*
25.2000 g | PACK | Freq: Every day | ORAL | Status: DC
Start: 2017-11-06 — End: 2017-11-07
  Administered 2017-11-06: 25.2 g via ORAL
  Filled 2017-11-06 (×3): qty 1

## 2017-11-06 MED ORDER — DEXTROSE 50 % IV SOLN *I*
25.0000 g | INTRAVENOUS | Status: AC | PRN
Start: 2017-11-06 — End: 2017-11-06

## 2017-11-06 MED ORDER — MIDAZOLAM HCL 1 MG/ML IJ SOLN *I* WRAPPED
INTRAMUSCULAR | Status: AC
Start: 2017-11-06 — End: 2017-11-06
  Filled 2017-11-06: qty 2

## 2017-11-06 NOTE — Invasive Procedure Plan of Care (Cosign Needed)
[place] [facility]  OR SURGICAL PROCEDURE                            Patient Name: Shane Pearson  [facility] [address]                                                           DOB: 08-26-1960     Please read this form or have someone read it to you.   It's important to understand all parts of this form. If something isn't clear, ask [place] to explain.   When you sign it, that means you understand the form and give [place] permission to do this surgery or procedure.    I agree for LEE, [place] along with any assistants* they may choose, to treat the following   condition(s): Venous access   By doing this surgery or procedure on me: A venous catheter will be placed and exchanged as need for 12 months   This is also known as: Central Venous Catheter placement   Laterality:          *if you'd like a list of the assistants, please ask. We can give that to you.    1.      The care provider has explained my condition to me. They have told me how the procedure can help me. They have told me about other ways of treating my condition. I understand the care provider cannot guarantee the result of the procedure. If I don't have this procedure, my other choices are: Not to perform the procedure    2.      The care provider has told me the risks (problems that can happen) of the procedure. I understand there may be unwanted results. The risks that are related to this procedure include: Allergic reaction to dye, bleeding, shock, infection, collapse of lung, possible need for chest tube placement, catheter malposition, thrombus, mechanical phlebitis, and in very rare circumstances death.    3.      I understand that during the procedure, my care provider may find a condition that we didn't know about before the treatment started. Therefore, I agree that my care provider can perform any other treatment which they think is necessary and available.    4.      I understand the care  provider may remove tissue, body parts, or materials during this procedure. These materials may be used to help with my diagnosis and treatment. They might also be used for teaching purposes or for research studies that I have separately agreed to participate in. Otherwise they will be disposed of as required by law.    5.     My care provider might want a representative from a [place] to be there during my procedure. I understand that person works for:           The ways they might help my care provider during my procedure include:               6.     Here are my decisions about receiving blood, blood products, or tissues. I understand my decisions cover the time before,  during and after my procedure, my treatment, and my time in the hospital. After my procedure, if my condition changes a lot, my care provider will talk with me again about receiving blood or blood products. At that time, my care provider might need me to review and sign another consent form, about getting or refusing blood.    I understand that the blood is from the community blood supply. Volunteers donated the blood, the volunteers were screened for health problems. The blood was examined with very sensitive and accurate tests to look for hepatitis, HIV/AIDS, and other diseases. Before I receive blood, it is tested again to make sure it is the correct type.    My chances of getting a sickness from blood products are small. But no transfusion is 100% safe. I understand that my care provider feels the good I will receive from the blood is greater than the chances of something going wrong. My care provider has answered my questions about blood products.        My decision           about blood or      blood products                   My decision     about tissue    implants                    I understand this form.       My care provider or his/her assistants have explained:        What I am having done and why I need it.      What other choices I can make instead of having this done.     The benefits and possible risks (problems) to me of having this done.     The benefits and possible risks (problems) to me of receiving transplants, blood, or blood products.     There is no guarantee of the results     The care provider may not stay with me the entire time that I am in the operating or procedure room. My provider has explained how this may affect my procedure. My provider has answered my questions about this.         I give my permission for this surgery or procedure.                                                                                                                                   My signature  (or parent or other person authorized to sign for you, if you are unable to sign for yourself or if you are under 63 years old)                              Date  Time   Electronic Signatures will display at the bottom of the consent form.    Care provider's statement: I have discussed the planned procedure, including the possibility for transfusion of blood products or receipt of tissue as necessary; expected benefits; the possible complications and risks; and possible alternatives and their benefits and risks with the patients or the patient's surrogate. In my opinion, the patient or the patient's surrogate understands the proposed procedure, its risks, benefits and alternatives.                          Adonis Housekeeper, DO                                         (Care providers signature)                                                                                                                                          (Printed name and title of care provider)        11/06/2017         Date        8:44 PM        Time

## 2017-11-06 NOTE — Progress Notes (Signed)
Brief Interventional Radiology Note    Reason for Consult: Need for tunneled HD line    Discussed with Robynn Pane via phone.     Assessment/Plan: 39M with broken TCL used for dialysis. Potassium 6.0.   Please place temporary HD line and dialyze patient   Plan for TCL placement tomorrow   NPO at midnight tonight    Signed by Nadene Rubins, MD  Interventional and Diagnostic Radiology PGY5  As of 11/06/2017  at 12:56 PM    Plan Discussed with Dr. Donneta Romberg (IR Attending).    Thank you for consulting Interventional Radiology. Please page IR on call with questions or concerns.

## 2017-11-06 NOTE — ED Notes (Signed)
ED RN [place], RN (RN) reviewed the following charting information by the RN intern: Lyndle Herrlich, RN     Nursing Assessments  Medications  Plan of Care  Teaching   Notes    In the chart of Shane Pearson (57 y.o. male) and attest to the charting being accurate.

## 2017-11-06 NOTE — Progress Notes (Signed)
[employer] Nursing Procedure Note    Shane Pearson  [id number]    Procedure: Dialysis Catheter Exchange        Status: Completed    Patient tolerated procedure well     Specimen Collection: no    Sponge count: N/A    Fluid Removed:N/A  Procedure Dressing Site located:Left IJ  Dressing Type:Dermabond and Occlusive  Biopatch:yes  Dressing status:Clean, dry and intact   Hematoma:Not evident  Medication received:Versed 1mg  and Heparin 1.71ml to both ports  Cardiovascular:   Peripheral Pulses: N/A      Fistula: N/A    Neuro Assessment:N/A    Implant patient information given to patient or parent/guardian:N/A    Report given to:Unit Nurse. Unit ED obs Name Shane Reams, RN      Last Filed Vitals    11/06/17 2150   BP:    Pulse: 70   Resp: 18   Temp:    SpO2: 100%

## 2017-11-06 NOTE — ED Notes (Signed)
Plan of Care     OR for perm cath  Monitor K+ lab and Glucose  Dialysis   NPO  meds per mar  VS q4

## 2017-11-06 NOTE — ED Notes (Signed)
Potasium found to be high in lab work. Orders to give D50 and regular insulin placed. Intal BG 75 prior to D50 and insulin administration. Report give to Day nurse to recheck BG q1hr for 2hr. EKG obtained and give to covering provider.

## 2017-11-06 NOTE — Progress Notes (Signed)
Interventional Radiology Pre-Procedure Handoff/Checklist    NPO: [] YES [x] NO [] N/A ate at 1800    Tube feed Stopped: [] YES [] NO [x] N/A     Anticoagulants:none    Telemetry: [] YES [x] NO [] N/A     Transport mode: Stretcher  Number of Transporters needed: 1        IV access:   Hemodialysis Catheter Double lumen Left Internal Jugular (Active)       Peripheral IV 11/06/17 0832 Right Hand (Active)   Phlebitis Scale Grade 0 11/06/2017 11:01 AM   Infiltration Scale Grade 0 11/06/2017 11:01 AM   Line Status Capped;Flushed 11/06/2017 11:01 AM   Dressing Type Transparent 11/06/2017 11:01 AM   Dressing Status Clean, dry and intact 11/06/2017  8:32 AM       Respiratory: Room Air    Consentable:yes    Alert and Oriented to person, place and time?: yes    Code Status:Full     Does patient wear an insulin pump? [] YES [] NO [x] N/A     Precautions:none    Allergies:   Allergies   Allergen Reactions    Penicillins Swelling     Face    Coconut Flavor Nausea And Vomiting    Amoxicillin Rash    Baclofen Other (See Comments)     confusion    No Known Latex Allergy        Phoebe Perch, RN Received Handoff Report from [place], [place] for IR procedure 8:14 PM

## 2017-11-06 NOTE — Invasive Procedure Plan of Care (Signed)
[place]  OR SURGICAL PROCEDURE                            Patient Name: Shane Pearson  [facility] [address]                                                           DOB: 01-13-1961     Please read this form or have someone read it to you.   It's important to understand all parts of this form. If something isn't clear, ask [place] to explain.   When you sign it, that means you understand the form and give [place] permission to do this surgery or procedure.    I agree for Mayer Camel D along with any assistants* they may choose, to treat the following   condition(s): Need for dialysis   By doing this surgery or procedure on me: Catheter for hemodialysis   This is also known as: HD catheter placement    Laterality:          *if you'd like a list of the assistants, please ask. We can give that to you.    1.      The care provider has explained my condition to me. They have told me how the procedure can help me. They have told me about other ways of treating my condition. I understand the care provider cannot guarantee the result of the procedure. If I don't have this procedure, my other choices are: No Line placement     2.      The care provider has told me the risks (problems that can happen) of the procedure. I understand there may be unwanted results. The risks that are related to this procedure include: Bleeding, infection, damage to surrounding structures, pneumothorax, arterial injury or unsuccessful     3.      I understand that during the procedure, my care provider may find a condition that we didn't know about before the treatment started. Therefore, I agree that my care provider can perform any other treatment which they think is necessary and available.    4.      I understand the care provider may remove tissue, body parts, or materials during this procedure. These materials may be used to help with my diagnosis and treatment. They might also  be used for teaching purposes or for research studies that I have separately agreed to participate in. Otherwise they will be disposed of as required by law.    5.     My care provider might want a representative from a [place] to be there during my procedure. I understand that person works for:           The ways they might help my care provider during my procedure include:               6.     Here are my decisions about receiving blood, blood products, or tissues. I understand my decisions cover the time before, during and after my procedure, my treatment, and my time in the hospital. After my procedure, if my condition changes a lot, my care provider  will talk with me again about receiving blood or blood products. At that time, my care provider might need me to review and sign another consent form, about getting or refusing blood.    I understand that the blood is from the community blood supply. Volunteers donated the blood, the volunteers were screened for health problems. The blood was examined with very sensitive and accurate tests to look for hepatitis, HIV/AIDS, and other diseases. Before I receive blood, it is tested again to make sure it is the correct type.    My chances of getting a sickness from blood products are small. But no transfusion is 100% safe. I understand that my care provider feels the good I will receive from the blood is greater than the chances of something going wrong. My care provider has answered my questions about blood products.        My decision           about blood or      blood products                   My decision     about tissue    implants                    I understand this form.       My care provider or his/her assistants have explained:        What I am having done and why I need it.     What other choices I can make instead of having this done.     The benefits and possible risks (problems) to me of having this done.     The benefits and  possible risks (problems) to me of receiving transplants, blood, or blood products.     There is no guarantee of the results     The care provider may not stay with me the entire time that I am in the operating or procedure room. My provider has explained how this may affect my procedure. My provider has answered my questions about this.         I give my permission for this surgery or procedure.                                                                                                                                   My signature  (or parent or other person authorized to sign for you, if you are unable to sign for yourself or if you are under 67 years old)                              Date                          Time   Electronic Signatures will display  at the bottom of the consent form.    Care provider's statement: I have discussed the planned procedure, including the possibility for transfusion of blood products or receipt of tissue as necessary; expected benefits; the possible complications and risks; and possible alternatives and their benefits and risks with the patients or the patient's surrogate. In my opinion, the patient or the patient's surrogate understands the proposed procedure, its risks, benefits and alternatives.                          Kennith Center, NP                                         (Care providers signature)                                                                                                                                          (Printed name and title of care provider)        11/06/2017         Date        1:48 PM        Time

## 2017-11-06 NOTE — Progress Notes (Signed)
Dialysis Attending Progress Note 11/06/2017 10:10 AM    Subjective:  Seen and examined  Still awaiting on HD line fix/replacement    Objective:  Vitals:    11/05/17 1953 11/06/17 0009 11/06/17 0450 11/06/17 0830   BP: 95/59 90/55 94/52  101/60   BP Location: Right arm Left arm Left arm Left arm   Pulse: 64 72 66 71   Resp: 16 16 16 16    Temp: 36.4 C (97.5 F) 36.1 C (97 F) 36.3 C (97.3 F) 36.1 C (97 F)   TempSrc: Temporal Temporal Temporal Temporal   SpO2: 98% 95% 97% 98%   Weight:       Height:           WNU:UVOZD, alert, oriented X 3  Eyes: anicteric sclerae  Mouth: Moist mucosa  CVS: RRR   Lungs: Clear anteriorly   Abd: Soft non tender non distended  Ext: no edema  Neuro: Awake Alert        Recent Labs  Lab 11/06/17  0456 11/05/17  1811 11/05/17  1312   Sodium  --   --  141   Potassium  --   --  6.1*   Chloride  --   --  96   CO2  --   --  17*   UN  --   --  82*   Creatinine 19.08* 17.73* 16.53*   17.23*   Glucose  --   --  76   Calcium  --   --  8.8   Phosphorus  --   --  3.3     Potassium - 6.0, HCO3 - 22    No results found for: PTH, VID25   Recent Labs  Lab 11/06/17  0456 11/05/17  1315   WBC 8.4 6.9   Hemoglobin 12.2* 13.5*   Hematocrit 40 47   Platelets 261 235     No results found for: FE, IBC, FESAT, FER     *chest Standard Frontal And Lateral Views    Result Date: 11/05/2017  Hypoexpanded lungs. No focal consolidations. END OF IMPRESSION This exam was primarily reviewed and a report rendered by Max Sane MD and was signed by the signing physician for administrative purposes.         Shane Pearson is a 57 y.o. Male  ESRD on HD qTTS, presents w/ crack in HD line     Plan:  1. CKD6 - plan to dialyze after line replaced/fixed  F180 Na 138 K 1 Ca 2.5 HCO3 35 Qb: 400 ml/min Qd: 1.5X Qb UF 2 L as hemodynamics tolerate      EDW =84.5 kg    2.  Anemia:         -no need for ESA    3.  Renal osteodystrophy:          Calcium, phosphorus as above         Continue calcitriol 1 mcg qHD         Continue  phosphate binder: sevelamer 800 mg po qac         sensipar 990 mg qday    4.  Hyperkalemia - continue w/ medical management: calcium, insulin, glucose, patiromer       5.  Access: HDTC   Author: Marijo File, MD  as of: 11/06/2017  at: 10:10 AM

## 2017-11-06 NOTE — Procedures (Signed)
Procedure Report           Time out documentation completed prior to procedure:  Yes    Indications/Pre-Procedure diagnosis:  Malfunctioning HD line    Procedure performed: IR tunneled central line placement (e.g. dialysis, apheresis cath)    Guide Wire Removed:Yes    Findings/Procedure Summary (detailed report located in the Image tab): Over the wire exchange of HD catheter    Complications:  none    Condition:  good    EBL: none    Specimens:  N/A    Operators: Dr. Mart Piggs and Dr Jamey Ripa    Disposition:  Pt tolerated procedure well and moved to recovery in stable condition.     Post-Procedure Diagnosis: Successful over the wire exchange of 14.5Fr x 23cm HD catheter.     Adonis Housekeeper, DO  11/06/2017  9:49 PM

## 2017-11-06 NOTE — ED Notes (Addendum)
AMA/discharge instructions given to patient and PIV removed. Wife coming to take patient home.

## 2017-11-06 NOTE — ED Obs Notes (Signed)
Pt was seen in EDOBS to have his HD catheter replaced.  Pt was sent to IR and had catheter replaced this evening.  Pt was also found to have hyperkalemia.  He was treated for this and had a repeat K of 5.7.  Pt offers no complaints.  Pt was scheduled to have a dialysis session at [facility] 11/07/2017  Has desired to sign out AMA.  Pt was explained the risks of this and signed the AMA form.  He had no questions, understood the risks.  He has states he will go to [facility] 11/07/2017 for his dialysis session.  He was also instructed to follow up with his PCP within 1 week.  He may return to the ED of condition worsens.       Myles Gip, NP  11/06/17 2237

## 2017-11-06 NOTE — Preop H&P (Signed)
History of Present Illness:  HPI         Past Medical History:   Diagnosis Date    Arthritis     CHF (congestive heart failure)     Colon polyp     Dialysis patient     ESRD (end stage renal disease)     Hypercholesterolemia     Hypotension     Thyroid disease     hypo           Past Surgical History:   Procedure Laterality Date    Right inguinal hernia repair             Family History   Problem Relation Age of Onset    Diabetes Mother     Asthma Mother     Coronary art dis Father      Social History           Social History    Marital status: Married     Spouse name: N/A    Number of children: N/A    Years of education: N/A           Social History Main Topics    Smoking status: Former Smoker     Packs/day: 0.30     Years: 15.00     Types: Cigarettes     Quit date: 08/02/1992    Smokeless tobacco: Never Used    Alcohol use No    Drug use: No    Sexual activity: Not Asked          Other Topics Concern    None     Social History Narrative       Allergies:         Allergies   Allergen Reactions    Penicillins Swelling     Face    Coconut Flavor Nausea And Vomiting    Amoxicillin Rash    No Known Latex Allergy            Current Outpatient Prescriptions   Medication    acetaminophen (TYLENOL) 325 MG tablet    pantoprazole (PROTONIX) 40 MG EC tablet    aspirin 81 MG EC tablet    Omega-3 Fatty Acids (FISH OIL) 1200 MG CPDR    Polyethylene Glycol 3350 (MIRALAX PO)    sevelamer (RENAGEL) 800 MG tablet    midodrine (PROAMATINE) 10 MG tablet    fluticasone (FLONASE) 50 MCG/ACT nasal spray    atorvastatin (LIPITOR) 10 MG tablet    fludrocortisone (FLORINEF) 0.1 mg tablet    b complex-vitamin c-folic acid (NEPHRO-VITE) tablet    calcium-vitamin D (OSCAL-500) 500-200 MG-UNIT per tablet    cinacalcet (SENSIPAR) 30 MG tablet    omeprazole (PRILOSEC) 20 MG capsule    docusate sodium (COLACE) 100 MG capsule    minocycline (MINOCIN) 100 MG capsule     diflunisal (DOLOBID) 500 MG TABS     No current facility-administered medications for this encounter.         Review of Systems:   ROS    Last Nursing documented pain:        No data found.        Physical Exam    Lab Results:   none    Radiology impressions (last 3 days):  No results found.    Currently Active/Followed Hospital Problems:  There are no active hospital problems to display for this patient.      Assessment:     Plan: dialysis catheter change

## 2017-11-06 NOTE — ED Obs Notes (Signed)
ED OBSERVATION PROGRESS NOTE     Patient seen by me today, 11/06/2017 at 8:44 AM    Current patient status: Observation    Chief Complaint:   Chief Complaint   Patient presents with    Dialysis Catheter issue     Subjective:  Patient expresses that he is extremely unhappy this morning. States that "no one cares" that he has not had dialysis in 5 days, and is exceptionally disappointed that he has not been able to eat. Patient states that he slept OK overnight. He continues to deny chest pain, shortness of breath, dizziness, headache, lightheadedness, nausea or vomiting, but states that this morning his vision is blurred secondary to not having completed dialysis. Patient states that this is typical if he misses dialysis. He denies further drainage from dialysis tubing overnight.    Patient also concerned that he has not eaten in last 24 hours and is repeatedly asking for food, and upset that he cannot eat until procedure is completed.     Nursing Pain Score:  Last Nursing documented pain:  0-10 Scale: 0 (11/06/17 0830)    Vitals: Patient Vitals for the past 24 hrs:   BP Temp Temp src Pulse Resp SpO2 Height Weight   11/06/17 0830 101/60 36.1 C (97 F) TEMPORAL 71 16 98 % - -   11/06/17 0450 94/52 36.3 C (97.3 F) TEMPORAL 66 16 97 % - -   11/06/17 0009 90/55 36.1 C (97 F) TEMPORAL 72 16 95 % - -   11/05/17 1953 95/59 36.4 C (97.5 F) TEMPORAL 64 16 98 % - -   11/05/17 1718 92/53 35.9 C (96.6 F) TEMPORAL 66 18 98 % - -   11/05/17 1316 94/52 36.5 C (97.7 F) TEMPORAL 60 15 100 % - -   11/05/17 0934 91/50 36 C (96.8 F) - 71 18 98 % 1.651 m (5\' 5" ) 89 kg (196 lb 3.4 oz)     Physical Examination:  Physical Exam   Constitutional: He is oriented to person, place, and time. He appears well-developed and well-nourished. He is cooperative.  Non-toxic appearance. He does not have a sickly appearance. He does not appear ill. No distress.   HENT:   Head: Normocephalic and atraumatic.   Eyes: Pupils are equal, round, and  reactive to light. EOM are normal.   Neck: Normal range of motion. Neck supple.   Cardiovascular: Normal rate, regular rhythm, normal heart sounds and intact distal pulses.        Pulmonary/Chest: Effort normal and breath sounds normal. No respiratory distress. He has no wheezes. He has no rales. He exhibits no tenderness.   Abdominal: Soft. Bowel sounds are normal. He exhibits no distension. There is no tenderness. There is no rebound and no guarding.   Musculoskeletal: Normal range of motion. He exhibits no tenderness or deformity.   Neurological: He is alert and oriented to person, place, and time. He exhibits normal muscle tone. Coordination normal.   Skin: Skin is warm and dry. Capillary refill takes less than 2 seconds. No rash noted. He is not diaphoretic. No erythema. No pallor.   Psychiatric: He has a normal mood and affect. His behavior is normal. Judgment and thought content normal.     WUX:[id number] from previous tracings, normal sinus rhythm    Lab Results:   All labs in the last 24 hours:   Recent Results (from the past 24 hour(s))   Hold green with gel    Collection Time: 11/05/17  1:12 PM   Result Value Ref Range    Hold Green (w/gel,spun) HOLD TUBE    Hold SST    Collection Time: 11/05/17  1:12 PM   Result Value Ref Range    Hold SST HOLD TUBE    Basic metabolic panel    Collection Time: 11/05/17  1:12 PM   Result Value Ref Range    Glucose 76 60 - 99 mg/dL    Sodium 141 133 - 145 mmol/L    Potassium 6.1 (H) 3.3 - 5.1 mmol/L    Chloride 96 96 - 108 mmol/L    CO2 17 (L) 20 - 28 mmol/L    Anion Gap 28 (H) 7 - 16    UN 82 (H) 6 - 20 mg/dL    Creatinine 16.53 (H) 0.67 - 1.17 mg/dL    GFR,Caucasian 3 (!) *    GFR,Black 3 (!) *    Calcium 8.8 8.6 - 10.2 mg/dL   Plasma profile 7 (ED only)    Collection Time: 11/05/17  1:12 PM   Result Value Ref Range    Chloride,Plasma 96 96 - 108 mmol/L    CO2,Plasma 18 (L) 20 - 28 mmol/L    Potassium,Plasma 6.4 (HH) 3.4 - 4.7 mmol/L    Sodium,Plasma 140 133 - 145 mmol/L     Anion Gap,PL 26 (H) 7 - 16    UN,Plasma 85 (H) 6 - 20 mg/dL    Creatinine 17.23 (H) 0.67 - 1.17 mg/dL    GFR,Caucasian 3 (!) *    GFR,Black 3 (!) *    Glucose,Plasma 76 60 - 99 mg/dL   Phosphorus    Collection Time: 11/05/17  1:12 PM   Result Value Ref Range    Phosphorus 3.3 2.7 - 4.5 mg/dL   Hold lavender    Collection Time: 11/05/17  1:15 PM   Result Value Ref Range    Hold Lav HOLD TUBES    CBC and differential    Collection Time: 11/05/17  1:15 PM   Result Value Ref Range    WBC 6.9 4.2 - 9.1 THOU/uL    RBC 5.1 4.6 - 6.1 MIL/uL    Hemoglobin 13.5 (L) 13.7 - 17.5 g/dL    Hematocrit 47 40 - 51 %    MCV 92 79 - 92 fL    MCH 27 26 - 32 pg/cell    MCHC 29 (L) 32 - 37 g/dL    RDW 19.2 (H) 11.6 - 14.4 %    Platelets 235 150 - 330 THOU/uL    Seg Neut % 60.3 %    Lymphocyte % 25.0 %    Monocyte % 9.1 %    Eosinophil % 4.0 %    Basophil % 0.6 %    Neut # K/uL 4.2 1.8 - 5.4 THOU/uL    Lymph # K/uL 1.7 1.3 - 3.6 THOU/uL    Mono # K/uL 0.6 0.3 - 0.8 THOU/uL    Eos # K/uL 0.3 0.0 - 0.5 THOU/uL    Baso # K/uL 0.0 0.0 - 0.1 THOU/uL    Nucl RBC % 0.0 0.0 - 0.2 /100 WBC    Nucl RBC # K/uL 0.0 0.0 - 0.0 THOU/uL    IMM Granulocytes # 0.1 THOU/uL    IMM Granulocytes 1.0 %   POCT glucose    Collection Time: 11/05/17  4:10 PM   Result Value Ref Range    Glucose POCT 76 60 - 99 mg/dL   POCT glucose  Collection Time: 11/05/17  4:51 PM   Result Value Ref Range    Glucose POCT 128 (H) 60 - 99 mg/dL   Plasma profile 7 (Adult ED only)    Collection Time: 11/05/17  6:11 PM   Result Value Ref Range    Chloride,Plasma 96 96 - 108 mmol/L    CO2,Plasma 21 20 - 28 mmol/L    Potassium,Plasma 5.2 (H) 3.4 - 4.7 mmol/L    Sodium,Plasma 141 133 - 145 mmol/L    Anion Gap,PL 24 (H) 7 - 16    UN,Plasma 87 (H) 6 - 20 mg/dL    Creatinine 17.73 (H) 0.67 - 1.17 mg/dL    GFR,Caucasian 3 (!) *    GFR,Black 3 (!) *    Glucose,Plasma 72 60 - 99 mg/dL   POCT glucose    Collection Time: 11/05/17 10:09 PM   Result Value Ref Range    Glucose POCT 107 (H) 60 -  99 mg/dL   Plasma profile 7    Collection Time: 11/06/17  4:56 AM   Result Value Ref Range    Chloride,Plasma 97 96 - 108 mmol/L    CO2,Plasma 22 20 - 28 mmol/L    Potassium,Plasma 6.0 (H) 3.4 - 4.7 mmol/L    Sodium,Plasma 142 133 - 145 mmol/L    Anion Gap,PL 23 (H) 7 - 16    UN,Plasma 101 (H) 6 - 20 mg/dL    Creatinine 19.08 (H) 0.67 - 1.17 mg/dL    GFR,Caucasian 2 (!) *    GFR,Black 3 (!) *    Glucose,Plasma 72 60 - 99 mg/dL   CBC and differential    Collection Time: 11/06/17  4:56 AM   Result Value Ref Range    WBC 8.4 4.2 - 9.1 THOU/uL    RBC 4.6 4.6 - 6.1 MIL/uL    Hemoglobin 12.2 (L) 13.7 - 17.5 g/dL    Hematocrit 40 40 - 51 %    MCV 87 79 - 92 fL    MCH 27 26 - 32 pg/cell    MCHC 30 (L) 32 - 37 g/dL    RDW 18.3 (H) 11.6 - 14.4 %    Platelets 261 150 - 330 THOU/uL    Seg Neut % 67.5 %    Lymphocyte % 19.1 %    Monocyte % 9.3 %    Eosinophil % 3.8 %    Basophil % 0.2 %    Neut # K/uL 5.7 (H) 1.8 - 5.4 THOU/uL    Lymph # K/uL 1.6 1.3 - 3.6 THOU/uL    Mono # K/uL 0.8 0.3 - 0.8 THOU/uL    Eos # K/uL 0.3 0.0 - 0.5 THOU/uL    Baso # K/uL 0.0 0.0 - 0.1 THOU/uL    Nucl RBC % 0.0 0.0 - 0.2 /100 WBC    Nucl RBC # K/uL 0.0 0.0 - 0.0 THOU/uL    IMM Granulocytes # 0.0 THOU/uL    IMM Granulocytes 0.1 %   Protime-INR    Collection Time: 11/06/17  4:56 AM   Result Value Ref Range    Protime 12.2 10.0 - 12.9 sec    INR 1.1 0.9 - 1.1   POCT glucose    Collection Time: 11/06/17  7:05 AM   Result Value Ref Range    Glucose POCT 75 60 - 99 mg/dL     Imaging findings:   Chest Standard Frontal And Lateral Views  Result Date: 11/05/2017  Hypoexpanded lungs. No focal consolidations.  Assessment: Shane Pearson is a 57 y.o. male with a PMH significant for ESRD on dialysis T/Th/Sat, admitted to the observation unit after evaluation in the emergency department for a "leaking dialysis catheter" noted while at dialysis appointment yesterday. Patient reports no symptoms overnight, but states that he woke with blurred vision this morning.  Physical exam notable for well appearing male sitting at edge of bed. Cardiopulmonary and abdominal exam unremarkable. PERRL, EOM intact. Patient with Left supraclavicular HD cath in place. There is no frank drainage from exposed tubing. Patient appears comfortable in no acute distress at time of exam.       Plan:   1. HD catheter leakage / Hyperkalemia   -- Nephrology consulted, appreciate recommendations   -- IR Catheter change today - contacted IR for time   -- NPO for procedure   -- Dialysis following catheter exchange   -- Continue with low potassium diet once able to eat   -- Daily labs while admitted   -- K 6.0 this morning - Valtessa ordered   -- Continue to monitor on telemetry   -- Continue Calcium carbonate, sensipar, sevelmer, nephrovite, and phosphorus    2. Hypotension - chronic and continues at baseine   -- Continue to monitor BP regularly   -- Continue Midodrine / Florinef    3. HLD - Continue home atorvastatin    4. GERD - Continue home PPE    5. Acne - Continue home minocycline    Medically preferred DVT prophylaxis: None     Author: Vania Rea, PA  Note created: 11/06/2017  at: 8:43 AM     Update: 3:39 PM     -- Patient discussed with IR provider. Patient was initially intended to have procedure done today. Unfortunately due to unforseen circumstances and other emergent IR cases, patient will not be able to have procedure done. Plan for having non-tunneled line placed for hemodialysis today with replacement of tunneled catheter tomorrow. Discussed plan with patient. Patient extremely upset, but in agreement with plan. Medicine procedure team contacted, when they came to see patient, he refuses line placement stating that he will "roll the dice."    -- Writer extensively discussed risks with patient including but not limited to dysrhythmia and death. Patient expresses understanding, but continues to refuse procedure. States that he will wait until tomorrow and have tunneled line exchanged.      -- Patient in agreement with blood draw for evaluation of potassium level after Valtessa this morning. Low potassium diet until midnight, then NPO for IR procedure tomorrow.     11/06/17 3:44 PM   Vania Rea, PA         Robynn Pane [place], [place]  11/06/17 [phone]

## 2017-11-06 NOTE — Progress Notes (Addendum)
11/06/17 1400   UM Patient Class Review   Patient Class Review Observation   Patient Class effective 11/05/17  Patient notified at bedside of MOON, copy given to patient and one placed in chart.  Connye Burkitt RN  Utilization Management  [phone]

## 2017-11-06 NOTE — ED Notes (Signed)
Pt back from IR. Catheter placement CDI. Not bleeding. Pt requesting to leave to have dialysis at his own site tomorrow. Pt wants to go AMA. Provider AD,NP aware.

## 2017-11-06 NOTE — ED Notes (Addendum)
Plan of Care     VSq4  Meds per [facility]  IR cath replacement/exchange AM   NPO   Tele   Comfort measures

## 2017-11-06 NOTE — ED Notes (Signed)
Pt was upset that he has been NPO for his procedure today. Writer explained the importance not eating or drinking before a procedure. Pt verbalized understanding but stated " He didn't care." Pt is compliant with being npo. Pt has no complaints at this time. Pt was updated on poc and did not appear to be in distress. Call bell within reach.  Pt BG checked was 58, provider notified and an order was placed for dextrose 50% injection 25 g. Will recheck bg in a hour.

## 2017-11-06 NOTE — Plan of Care (Signed)
Medicine Procedure Team Note    Upon meeting Mr. Rentz and discussing the request by his primary team to place a temporary HD line, Mr.Arman refused the procedure verbalizing concern of potential scaring and potentially loosing the Left IJ as a site for access. Currently his right IJ is not an option for a liner due to scarring. During our conversation, an IR resident was present and explained how the current line could be removed followed by a placement of a temporary line with the plans to use the temporary entry site for the placement of the tunneled catheter in the morning. The risks of not receiving the line and not having dialysis was explained especially with his hight K. Mr. Surgeon wanted to take that chance of potential arrhythmias and wait to have the tunneled catheter placed in the morning. Patient is alert, oriented and competent. Discussed with the primary team.   The procedure team is available for a line placement if eh changed hi mind.  Thank you for the consult    [facility], NP  Hospital Medicine APP Procedure Team   11/06/17 3:24 PM    Please page PIC [phone] with any questions/concerns.

## 2017-11-06 NOTE — ED Notes (Signed)
Assessment complete, Pt denies dizziness or headache, Pt also denies any pain. Pt hemodialysis catheter dressing is clean dry and intact. Pt has no complaints. Pt updated on plan of care, call bell and bed side table are within reach. Will continue to monitor per orders.

## 2017-11-06 NOTE — Progress Notes (Addendum)
Interventional Radiology Pre-Procedure Handoff/Checklist    NPO: [x] YES [] NO [] N/A     Tube feed Stopped: [] YES [] NO [x] N/A     Anticoagulants: asa 81 mg    Telemetry: [] YES [x] NO [] N/A     Transport mode: Stretcher  Number of Transporters needed: 1        IV access:   Hemodialysis Catheter Double lumen Left Internal Jugular (Active)       Peripheral IV 11/06/17 0832 Right Hand (Active)   Phlebitis Scale Grade 0 11/06/2017  8:32 AM   Infiltration Scale Grade 0 11/06/2017  8:32 AM   Line Status Flushed;Capped 11/06/2017  8:32 AM   Dressing Type Transparent 11/06/2017  8:32 AM   Dressing Status Clean, dry and intact 11/06/2017  8:32 AM       Respiratory: Room Air    Consentable:yes    Alert and Oriented to person, place and time?: yes    Code Status:Full     Does patient wear an insulin pump? [] YES [] NO [x] N/A     Precautions:none    Allergies:   Allergies   Allergen Reactions    Penicillins Swelling     Face    Coconut Flavor Nausea And Vomiting    Amoxicillin Rash    Baclofen Other (See Comments)     confusion    No Known Latex Allergy        Phoebe Perch, RN Received Handoff Report from [place], [place] for IR procedure 10:02 AM      Procedure unable to be done okay Kristeen Miss bedside nurse informed

## 2017-11-06 NOTE — Plan of Care (Signed)
Medicine Procedure Team Note    Noted order for bedside HD catheter placement . Chart reviewed.     Please have the patient transferred to the CCB to allow cardiac monitoring and the room required for the procedure .  Please have the heparinized saline 1000units/ml saline ant the bedside   Thank you     [facility], NP  Hospital Medicine APP Procedure Team   11/06/17 1:53 PM    Please page PIC [phone] with any questions/concerns.

## 2017-11-06 NOTE — Discharge Instructions (Signed)
Followed up with Shane Pearson on 11/06/2017 at 10:23 PM. Patient left the Observation Unit with a disposition of AMA on . Patient cited desire to attend [facility] for dialysis session as reason. Advised patient to follow up with a primary care physician or return to the Emergency Department if symptoms worsen.   It is very important that you attend your dialysis session tomorrow 11/07/2017.  Please follow up with your doctor within one week.  You may return to the ED if your symptoms worsen or for any other problem.  You should have your bloodwork done after your dialysis.   Myles Gip, NP

## 2017-11-06 NOTE — ED Notes (Signed)
Pt bg rechecked 98

## 2017-11-06 NOTE — H&P (View-Only) (Signed)
History of Present Illness:  HPI         Past Medical History:   Diagnosis Date    Arthritis     CHF (congestive heart failure)     Colon polyp     Dialysis patient     ESRD (end stage renal disease)     Hypercholesterolemia     Hypotension     Thyroid disease     hypo           Past Surgical History:   Procedure Laterality Date    Right inguinal hernia repair             Family History   Problem Relation Age of Onset    Diabetes Mother     Asthma Mother     Coronary art dis Father      Social History           Social History    Marital status: Married     Spouse name: N/A    Number of children: N/A    Years of education: N/A           Social History Main Topics    Smoking status: Former Smoker     Packs/day: 0.30     Years: 15.00     Types: Cigarettes     Quit date: 08/02/1992    Smokeless tobacco: Never Used    Alcohol use No    Drug use: No    Sexual activity: Not Asked          Other Topics Concern    None     Social History Narrative       Allergies:         Allergies   Allergen Reactions    Penicillins Swelling     Face    Coconut Flavor Nausea And Vomiting    Amoxicillin Rash    No Known Latex Allergy            Current Outpatient Prescriptions   Medication    acetaminophen (TYLENOL) 325 MG tablet    pantoprazole (PROTONIX) 40 MG EC tablet    aspirin 81 MG EC tablet    Omega-3 Fatty Acids (FISH OIL) 1200 MG CPDR    Polyethylene Glycol 3350 (MIRALAX PO)    sevelamer (RENAGEL) 800 MG tablet    midodrine (PROAMATINE) 10 MG tablet    fluticasone (FLONASE) 50 MCG/ACT nasal spray    atorvastatin (LIPITOR) 10 MG tablet    fludrocortisone (FLORINEF) 0.1 mg tablet    b complex-vitamin c-folic acid (NEPHRO-VITE) tablet    calcium-vitamin D (OSCAL-500) 500-200 MG-UNIT per tablet    cinacalcet (SENSIPAR) 30 MG tablet    omeprazole (PRILOSEC) 20 MG capsule    docusate sodium (COLACE) 100 MG capsule    minocycline (MINOCIN) 100 MG capsule     diflunisal (DOLOBID) 500 MG TABS     No current facility-administered medications for this encounter.         Review of Systems:   ROS    Last Nursing documented pain:        No data found.        Physical Exam    Lab Results:   none    Radiology impressions (last 3 days):  No results found.    Currently Active/Followed Hospital Problems:  There are no active hospital problems to display for this patient.      Assessment:     Plan: dialysis catheter change

## 2017-11-06 NOTE — ED Notes (Signed)
Pt sleeping/snoring

## 2017-11-06 NOTE — ED Notes (Signed)
Pt OTF at IR.

## 2017-11-06 NOTE — Interval H&P Note (Signed)
UPDATES TO PATIENT'S CONDITION on the DAY OF SURGERY/PROCEDURE    I. Updates to Patient's Condition (to be completed by a provider privileged to complete a H&P, following reassessment of the patient by the provider):    Day of Surgery/Procedure Update:  History  History reviewed and no change    Physical  Physical exam updated and no change            II. Procedure Readiness   I have reviewed the patient's H&P and updated condition. By completing and signing this form, I attest that this patient is ready for surgery/procedure.    III. Attestation   I have reviewed the updated information regarding the patient's condition and it is appropriate to proceed with the planned surgery/procedure.    Zamyiah Tino Jennet Maduro, MD as of 9:14 PM 11/06/2017

## 2017-11-07 ENCOUNTER — Other Ambulatory Visit
Admission: RE | Admit: 2017-11-07 | Discharge: 2017-11-07 | Disposition: A | Discharge status: Home / Self Care | Payer: Medicare Other | Source: Ambulatory Visit | Attending: Nephrology | Admitting: Nephrology

## 2017-11-07 LAB — POTASSIUM: Potassium: 4.1 mmol/L (ref 3.3–5.1)

## 2017-11-08 LAB — EKG 12-LEAD
P: 60 deg
PR: 158 ms
QRS: 25 deg
QRSD: 86 ms
QT: 422 ms
QTc: 422 ms
Rate: 60 {beats}/min
T: 87 deg

## 2017-11-09 ENCOUNTER — Encounter: Payer: Medicare Other | Admitting: Nephrology

## 2017-11-09 DIAGNOSIS — N186 End stage renal disease: Secondary | ICD-10-CM

## 2017-11-09 DIAGNOSIS — Z992 Dependence on renal dialysis: Secondary | ICD-10-CM

## 2017-11-20 LAB — EKG 12-LEAD
P: 63 deg
PR: 167 ms
QRS: 16 deg
QRSD: 94 ms
QT: 390 ms
QTc: 433 ms
Rate: 74 {beats}/min
T: 144 deg

## 2017-12-09 ENCOUNTER — Encounter: Payer: Medicare Other | Admitting: Nephrology

## 2017-12-09 DIAGNOSIS — Z992 Dependence on renal dialysis: Secondary | ICD-10-CM

## 2017-12-09 DIAGNOSIS — N186 End stage renal disease: Secondary | ICD-10-CM

## 2018-01-09 ENCOUNTER — Encounter: Payer: Medicare Other | Admitting: Nephrology

## 2018-01-09 DIAGNOSIS — N186 End stage renal disease: Secondary | ICD-10-CM

## 2018-01-09 DIAGNOSIS — Z992 Dependence on renal dialysis: Secondary | ICD-10-CM

## 2018-01-28 ENCOUNTER — Other Ambulatory Visit: Payer: Self-pay | Admitting: Transplant

## 2018-01-28 ENCOUNTER — Encounter: Payer: Self-pay | Admitting: Transplant

## 2018-01-28 DIAGNOSIS — Z992 Dependence on renal dialysis: Secondary | ICD-10-CM

## 2018-01-28 DIAGNOSIS — N186 End stage renal disease: Secondary | ICD-10-CM

## 2018-01-30 ENCOUNTER — Encounter: Payer: Self-pay | Admitting: Transplant

## 2018-01-30 LAB — PLATELET COUNT: Platelets: 241 10*3/uL — NL

## 2018-02-03 ENCOUNTER — Other Ambulatory Visit
Admission: RE | Admit: 2018-02-03 | Discharge: 2018-02-03 | Disposition: A | Discharge status: Home / Self Care | Payer: Medicare Other | Source: Ambulatory Visit | Attending: Nephrology | Admitting: Nephrology

## 2018-02-03 LAB — PROTIME-INR
INR: 2.3 — ABNORMAL HIGH (ref 0.9–1.1)
Protime: 27.1 s — ABNORMAL HIGH (ref 10.0–12.9)

## 2018-02-08 ENCOUNTER — Other Ambulatory Visit
Admission: RE | Admit: 2018-02-08 | Discharge: 2018-02-08 | Disposition: A | Discharge status: Home / Self Care | Payer: Medicare Other | Source: Ambulatory Visit | Attending: Nephrology | Admitting: Nephrology

## 2018-02-08 ENCOUNTER — Encounter: Payer: Medicare Other | Admitting: Nephrology

## 2018-02-08 DIAGNOSIS — Z992 Dependence on renal dialysis: Secondary | ICD-10-CM

## 2018-02-08 DIAGNOSIS — N186 End stage renal disease: Secondary | ICD-10-CM

## 2018-02-08 LAB — PROTIME-INR
INR: 1 (ref 0.9–1.1)
Protime: 12.1 s (ref 10.0–12.9)

## 2018-02-11 ENCOUNTER — Telehealth: Payer: Self-pay

## 2018-02-11 NOTE — Anesthesia Preprocedure Evaluation (Addendum)
Anesthesia Pre-operative History and Physical for Shane Pearson    Highlighted Issues for this Procedure:  57M presenting for IR tunneled central line placement for HD.    Allergies:   -- Penicillins -- Swelling    --  Face   -- Coconut Flavor -- Nausea And Vomiting   -- Amoxicillin -- Rash   -- Baclofen -- Other (See Comments)    --  confusion   -- No Known Latex Allergy     Habitus:  - Estimated body mass index is 32.65 kg/m as calculated from the following:     Height as of 11/05/17: 1.651 m (5\' 5" ).    Weight as of 11/05/17: 89 kg (196 lb 3.4 oz).    PMH:  - hypotension on  fludrocortisone and midodrine  - GERD on omeprazole  - ESRD with metabolic derangement   - chronic back pain    Anesthetic history:  - No previous anesthetic records were available for comparison.      Stress Test/Echocardiography:  Stress echo (2012):  Normal resting LVEF without significant regional wall   motion abnormalities. Mild aortic valve sclerosis. Estimated PA   pressure within normal limits. Negative treadmill exercise echo for   myocardial ischemia at workload achieved.       .  .  Anesthesia Evaluation Information Source: patient, records     ANESTHESIA HISTORY     Denies anesthesia history  Pertinent(-):  No History of anesthetic complications    GENERAL    + Obesity          upper body, central     PULMONARY    + Smoker          tobacco former  Pertinent(-):  No asthma, sleep apnea or COPD    CARDIOVASCULAR     Denies cardiovascular issues  Pertinent(-):  No hypertension    GI/HEPATIC/RENAL     Denies GI/hepatic/renal issues  Last PO Intake: >8hr before procedure    + GERD          well controlled    + Renal Issues          ESRD  Comment: Dialysis Yesterday NEURO/PSYCH  Pertinent(-):  No seizures or cerebrovascular event    ENDO/OTHER    + Thyroid Disease    HEMATOLOGIC    + Arthritis         Physical Exam    Airway            Mallampati: II            TM distance (fb): >3 FB            TM distance (cm): 4            Neck ROM:  full  Dental        Comment: Bridges marked  Multiple Caps  Denies loose teeth   Cardiovascular           Rhythm: regular           Rate: normal         Pulmonary   Normal Exam           ________________________________________________________________________  PLAN  ASA Score  3  Anesthetic Plan general     Induction (routine IV) General Anesthesia/Sedation Maintenance Plan (inhaled agents);  Airway Manipulation (none); Airway (LMA); Line ( use current access); Monitoring (standard ASA); Positioning (supine); PONV Plan (dexamethasone and ondansetron); Pain (per surgical team and intraop local); PostOp (PACU)  Informed Consent     Risks:        Risks discussed were commensurate with the plan listed above with the following specific points: sore throat, hypotension, headache and failed block, Damage to: eyes, nerves and teeth.    Anesthetic Consent:         Anesthetic plan (and risks as noted above) were discussed with patient and spouse    Plan also discussed with team members including:       resident    Attending Attestation:  As the primary attending anesthesiologist, I attest that the patient or proxy understands and accepts the risks and benefits of the anesthesia plan. I also attest that I have personally performed a pre-anesthetic examination and evaluation, and prescribed the anesthetic plan for this particular location within 48 hours prior to the anesthetic as documented. Oren Section, MD 2:38 PM

## 2018-02-11 NOTE — Telephone Encounter (Signed)
12/3 Pre-call complete.  Plts 241 (11/14).  INR 1.0 (11/30).  Given arrival time.  CW

## 2018-02-12 ENCOUNTER — Encounter: Payer: Self-pay | Admitting: Anesthesiology

## 2018-02-12 ENCOUNTER — Ambulatory Visit: Payer: Medicare Other | Admitting: Anesthesiology

## 2018-02-12 ENCOUNTER — Ambulatory Visit
Admission: RE | Admit: 2018-02-12 | Discharge: 2018-02-12 | Disposition: A | Discharge status: Home / Self Care | Payer: Medicare Other | Source: Ambulatory Visit | Attending: Radiology | Admitting: Radiology

## 2018-02-12 ENCOUNTER — Encounter: Payer: Self-pay | Admitting: Gastroenterology

## 2018-02-12 DIAGNOSIS — N186 End stage renal disease: Secondary | ICD-10-CM

## 2018-02-12 DIAGNOSIS — T82828A Fibrosis of vascular prosthetic devices, implants and grafts, initial encounter: Secondary | ICD-10-CM | POA: Insufficient documentation

## 2018-02-12 DIAGNOSIS — Y831 Surgical operation with implant of artificial internal device as the cause of abnormal reaction of the patient, or of later complication, without mention of misadventure at the time of the procedure: Secondary | ICD-10-CM | POA: Insufficient documentation

## 2018-02-12 DIAGNOSIS — Z992 Dependence on renal dialysis: Secondary | ICD-10-CM

## 2018-02-12 DIAGNOSIS — I509 Heart failure, unspecified: Secondary | ICD-10-CM | POA: Insufficient documentation

## 2018-02-12 DIAGNOSIS — Z87891 Personal history of nicotine dependence: Secondary | ICD-10-CM | POA: Insufficient documentation

## 2018-02-12 DIAGNOSIS — K219 Gastro-esophageal reflux disease without esophagitis: Secondary | ICD-10-CM | POA: Insufficient documentation

## 2018-02-12 MED ORDER — KETAMINE HCL 10 MG/ML IJ/IV SOLN *WRAPPED*
Status: DC | PRN
Start: 2018-02-12 — End: 2018-02-12
  Administered 2018-02-12: 20 mg via INTRAVENOUS

## 2018-02-12 MED ORDER — LIDOCAINE HCL 1 % IJ SOLN *I*
INTRAMUSCULAR | Status: AC
Start: 2018-02-12 — End: 2018-02-12
  Filled 2018-02-12: qty 20

## 2018-02-12 MED ORDER — MIDAZOLAM HCL 1 MG/ML IJ SOLN *I* WRAPPED
INTRAMUSCULAR | Status: AC
Start: 2018-02-12 — End: 2018-02-12
  Filled 2018-02-12: qty 2

## 2018-02-12 MED ORDER — ONDANSETRON HCL 2 MG/ML IV SOLN *I*
1.0000 mg | Freq: Once | INTRAMUSCULAR | Status: DC | PRN
Start: 2018-02-12 — End: 2018-02-12

## 2018-02-12 MED ORDER — KETAMINE HCL 10 MG/ML IJ/IV SOLN *WRAPPED*
Status: AC
Start: 2018-02-12 — End: 2018-02-12
  Filled 2018-02-12: qty 5

## 2018-02-12 MED ORDER — DEXAMETHASONE SODIUM PHOSPHATE 4 MG/ML INJ SOLN *WRAPPED*
INTRAMUSCULAR | Status: DC | PRN
Start: 2018-02-12 — End: 2018-02-12
  Administered 2018-02-12: 4 mg via INTRAVENOUS

## 2018-02-12 MED ORDER — EPHEDRINE 5MG/ML IN NS IV/IJ *WRAPPED*
INTRAMUSCULAR | Status: DC | PRN
Start: 2018-02-12 — End: 2018-02-12
  Administered 2018-02-12 (×2): 10 mg via INTRAVENOUS

## 2018-02-12 MED ORDER — PROPOFOL 10 MG/ML IV EMUL (INTERMITTENT DOSING) WRAPPED *I*
INTRAVENOUS | Status: DC | PRN
Start: 2018-02-12 — End: 2018-02-12
  Administered 2018-02-12: 35 mg via INTRAVENOUS
  Administered 2018-02-12: 20 mg via INTRAVENOUS

## 2018-02-12 MED ORDER — HEPARIN SODIUM (PORCINE) 1000 UNIT/ML IJ SOLN *WRAPPED*
Status: AC
Start: 2018-02-12 — End: 2018-02-12
  Filled 2018-02-12: qty 10

## 2018-02-12 MED ORDER — PHENYLEPHRINE 200 MCG/ML 50 ML OR SYRINGE *I*
INTRAVENOUS | Status: DC | PRN
Start: 2018-02-12 — End: 2018-02-12
  Administered 2018-02-12: 30 ug/min via INTRAVENOUS

## 2018-02-12 MED ORDER — FENTANYL CITRATE 50 MCG/ML IJ SOLN *WRAPPED*
INTRAMUSCULAR | Status: DC | PRN
Start: 2018-02-12 — End: 2018-02-12
  Administered 2018-02-12: 50 ug via INTRAVENOUS

## 2018-02-12 MED ORDER — PHENYLEPHRINE 100 MCG/ML IN NS 10 ML *WRAPPED*
INTRAMUSCULAR | Status: DC | PRN
Start: 2018-02-12 — End: 2018-02-12
  Administered 2018-02-12 (×6): 200 ug via INTRAVENOUS

## 2018-02-12 MED ORDER — SODIUM CHLORIDE 0.9 % 100 ML IV SOLN *I*
6.2500 mg | Freq: Once | INTRAVENOUS | Status: DC | PRN
Start: 2018-02-12 — End: 2018-02-12

## 2018-02-12 MED ORDER — KETOROLAC TROMETHAMINE 30 MG/ML IJ SOLN *I*
INTRAMUSCULAR | Status: DC | PRN
Start: 2018-02-12 — End: 2018-02-12
  Administered 2018-02-12: 15 mg via INTRAVENOUS

## 2018-02-12 MED ORDER — HEPARIN SODIUM (PORCINE) 1000 UNIT/ML IJ SOLN *WRAPPED*
Status: AC | PRN
Start: 2018-02-12 — End: 2018-02-12
  Administered 2018-02-12: 2 mL via INTRAVENOUS

## 2018-02-12 MED ORDER — HALOPERIDOL LACTATE 5 MG/ML IJ SOLN *I*
0.5000 mg | Freq: Once | INTRAMUSCULAR | Status: DC | PRN
Start: 2018-02-12 — End: 2018-02-12

## 2018-02-12 MED ORDER — ETOMIDATE 2 MG/ML IV SOLN *I*
INTRAVENOUS | Status: DC | PRN
Start: 2018-02-12 — End: 2018-02-12
  Administered 2018-02-12: 6 mg via INTRAVENOUS

## 2018-02-12 MED ORDER — HEPARIN SODIUM (PORCINE) 1000 UNIT/ML IJ SOLN *WRAPPED*
Status: DC | PRN
  Administered 2018-02-12: 3000 [IU] via INTRAVENOUS

## 2018-02-12 MED ORDER — FENTANYL CITRATE 50 MCG/ML IJ SOLN *WRAPPED*
INTRAMUSCULAR | Status: AC
Start: 2018-02-12 — End: 2018-02-12
  Filled 2018-02-12: qty 2

## 2018-02-12 MED ORDER — ACETAMINOPHEN 10 MG/ML IV SOLN *I*
INTRAVENOUS | Status: DC | PRN
Start: 2018-02-12 — End: 2018-02-12
  Administered 2018-02-12: 1000 mg via INTRAVENOUS

## 2018-02-12 MED ORDER — SODIUM CHLORIDE 0.9 % IV SOLN WRAPPED *I*
30.0000 mL/h | Status: DC
Start: 2018-02-12 — End: 2018-02-12

## 2018-02-12 MED ORDER — OXYCODONE HCL 5 MG/5ML PO SOLN *I*
5.0000 mg | Freq: Once | ORAL | Status: DC | PRN
Start: 2018-02-12 — End: 2018-02-12

## 2018-02-12 MED ORDER — MEPERIDINE HCL 25 MG/ML IJ SOLN *I*
12.5000 mg | INTRAMUSCULAR | Status: DC | PRN
Start: 2018-02-12 — End: 2018-02-12

## 2018-02-12 MED ORDER — HEPARIN SODIUM (PORCINE) 1000 UNIT/ML IJ SOLN *WRAPPED*
Status: AC | PRN
Start: 2018-02-12 — End: 2018-02-12
  Administered 2018-02-12: 1.9 mL via INTRAVENOUS

## 2018-02-12 MED ORDER — OXYCODONE HCL 5 MG/5ML PO SOLN *I*
10.0000 mg | Freq: Once | ORAL | Status: DC | PRN
Start: 2018-02-12 — End: 2018-02-12

## 2018-02-12 MED ORDER — IOHEXOL 240 MG/ML (OMNIPAQUE) IV SOLN *I*
INTRAMUSCULAR | Status: AC | PRN
Start: 2018-02-12 — End: 2018-02-12
  Administered 2018-02-12: 30 mL via INTRAVENOUS

## 2018-02-12 MED ORDER — MIDAZOLAM HCL 1 MG/ML IJ SOLN *I* WRAPPED
INTRAMUSCULAR | Status: DC | PRN
Start: 2018-02-12 — End: 2018-02-12
  Administered 2018-02-12: 2 mg via INTRAVENOUS

## 2018-02-12 MED ORDER — ONDANSETRON HCL 2 MG/ML IV SOLN *I*
INTRAMUSCULAR | Status: DC | PRN
Start: 2018-02-12 — End: 2018-02-12
  Administered 2018-02-12: 4 mg via INTRAVENOUS

## 2018-02-12 MED ORDER — PLASMA-LYTE IV SOLN *WRAPPED*
Status: DC | PRN
Start: 2018-02-12 — End: 2018-02-12

## 2018-02-12 NOTE — Preop H&P (Signed)
OUTPATIENT  Chief Complaint: slow running HD cath    History of Present Illness:  HPI     57 yo M ESRD on HD with long term Left IJ tunneled 14.5 [place] by 23 cm palindrome hemodialysis catheter last changed on 11/05/17. Has been slow running, and had angioplasty in the past for this problem. Did not tolerate that procedure well so need GA for another attempted angioplasty if needed.    Past Medical History:   Diagnosis Date    Arthritis     CHF (congestive heart failure)     Colon polyp     Dialysis patient     ESRD (end stage renal disease)     GERD (gastroesophageal reflux disease)     Hypercholesterolemia     Hypotension     Ischemic colitis     Thyroid disease     hypo     Past Surgical History:   Procedure Laterality Date    Right inguinal hernia repair       Family History   Problem Relation Age of Onset    Diabetes Mother     Asthma Mother     Coronary artery disease Father      Social History     Socioeconomic History    Marital status: Married     Spouse name: Not on file    Number of children: Not on file    Years of education: Not on file    Highest education level: Not on file   Tobacco Use    Smoking status: Former Smoker     Packs/day: 0.30     Years: 15.00     Pack years: 4.50     Types: Cigarettes     Last attempt to quit: 08/02/1992     Years since quitting: 25.5    Smokeless tobacco: Never Used   Substance and Sexual Activity    Alcohol use: No    Drug use: No    Sexual activity: Not on file   Other Topics Concern    Not on file   Social History Narrative    Not on file       Allergies:   Allergies   Allergen Reactions    Penicillins Swelling     Face    Coconut Flavor Nausea And Vomiting    Amoxicillin Rash    Baclofen Other (See Comments)     confusion    No Known Latex Allergy        Current Outpatient Medications   Medication    aspirin 81 MG EC tablet    sevelamer (RENVELA) 800 MG tablet    acetaminophen (TYLENOL) 325 MG tablet    midodrine (PROAMATINE) 10 MG  tablet    fluticasone (FLONASE) 50 MCG/ACT nasal spray    atorvastatin (LIPITOR) 10 MG tablet    fludrocortisone (FLORINEF) 0.1 mg tablet    b complex-vitamin c-folic acid (NEPHRO-VITE) tablet    calcium-vitamin D (OSCAL-500) 500-200 MG-UNIT per tablet    cinacalcet (SENSIPAR) 30 MG tablet    omeprazole (PRILOSEC) 20 MG capsule    minocycline (MINOCIN) 100 MG capsule     Current Facility-Administered Medications   Medication Dose Route Frequency    sodium chloride 0.9 % IV  30 mL/hr Intravenous Continuous        Review of Systems:   ROS    Last Nursing documented pain:  0-10 Scale: 0 (02/12/18 1018)      Patient Vitals for the past 24  hrs:   BP Temp Temp src Pulse Resp SpO2 Height Weight   02/12/18 1004 99/60 35.6 C (96.1 F) TEMPORAL 71 16 96 % 162.6 cm (5\' 4" ) 87 kg (191 lb 12.8 oz)            Physical Exam    Gen: NAD, well appearing  Oral: dentition good  CV: Regular, well perfused  Pulm: CTA bl  Abd: nondistended  MSK: full ROM grossly  Psych: approp mood    Lab Results:   All labs in the last 72 hours:No results found for this or any previous visit (from the past 72 hour(s)).    Radiology impressions (last 3 days):  No results found.    Currently Active/Followed Hospital Problems:  There are no active hospital problems to display for this patient.      Assessment: 57 yo ESRD needs HD cath exchange and angiogram to assess stenosis.    Plan:   Proceed with exchange and angiogram possible angioplasty.    Author: Vassie Moment, MD  Note created: 02/12/2018  at: 10:27 AM

## 2018-02-12 NOTE — Anesthesia Procedure Notes (Signed)
---------------------------------------------------------------------------------------------------------------------------------------    AIRWAY   GENERAL INFORMATION AND STAFF    Patient location during procedure: OR       Date of Procedure: 02/12/2018 12:58 PM  CONDITION PRIOR TO MANIPULATION     Current Airway/Neck Condition:  Normal        For more airway physical exam details, see Anesthesia PreOp Evaluation  AIRWAY METHOD     Patient Position:  Sniffing    Preoxygenated: yes      Induction: IV    Mask Difficulty Assessment:  1 - vent by mask      Mask NMB: 1 - vent by mask    Number of Attempts at Approach:  1    Number of Other Approaches Attempted:  0  FINAL AIRWAY DETAILS    Final Airway Type:  LMA    Final LMA: Unique    LMA Size: 4  ----------------------------------------------------------------------------------------------------------------------------------------

## 2018-02-12 NOTE — Anesthesia Postprocedure Evaluation (Signed)
Anesthesia Post-Op Note    Patient: Shane Pearson    Procedure(s) Performed:  Procedure Summary  Date:  02/12/2018 Anesthesia Start: 02/12/2018 12:11 PM Anesthesia Stop: 02/12/2018  2:20 PM Room / Location:  * No operating room entered * / [facility]   * No procedures listed * Diagnosis:  * No pre-op diagnosis entered * * No surgeons listed * Attending Anesthesiologist:  Oren Section, MD         Recovery Vitals  BP: (!) 87/55 (02/12/2018  2:15 PM)  Heart Rate: 67 (02/12/2018  2:15 PM)  Resp: 18 (02/12/2018  2:15 PM)  Temp: 36 C (96.8 F) (02/12/2018  2:15 PM)  SpO2: 98 % (02/12/2018  2:15 PM)  O2 Flow Rate: 2 L/min (02/12/2018  2:15 PM)   0-10 Scale: 0 (02/12/2018  2:15 PM)    Anesthesia type:  general  Complications Noted During Procedure or in PACU:  None   Comment:    Patient Location:  PACU  Level of Consciousness:    Recovered to baseline  Patient Participation:     Able to participate  Temperature Status:    Actively cooling  Oxygen Saturation:    Within patient's normal range  Cardiac Status:   Within patient's normal range  Fluid Status:    Stable  Airway Patency:     Yes  Pulmonary Status:    Baseline  Pain Management:    Adequate analgesia  Nausea and Vomiting:  None    Post Op Assessment:    Tolerated procedure well"   Attending Attestation:  All indicated post anesthesia care provided       -

## 2018-02-12 NOTE — Invasive Procedure Plan of Care (Signed)
[place]  OR SURGICAL PROCEDURE                            Patient Name: Shane Pearson  [facility] [address]                                                           DOB: October 22, 1960     Please read this form or have someone read it to you.   It's important to understand all parts of this form. If something isn't clear, ask [place] to explain.   When you sign it, that means you understand the form and give [place] permission to do this surgery or procedure.       I agree for Lora Paula, MD along with any assistants* they may choose, to treat the following condition(s): ESRD requiring HD catheter, poor functioning catheter   By doing this surgery or procedure on me: Imaging assessment of blood vessels.  Vessel dilation, stenting or blocking off of the vessel may also be performed if necessary   This is also known as: Angiogram/Angioplasty/Stenting/Embolization/Thrombolysis   Laterality:     *if you'd like a list of the assistants, please ask. We can give that to you.      1.      The care provider has explained my condition to me. They have told me how the procedure can help me. They have told me about other ways of treating my condition. I understand the care provider cannot guarantee the result of the procedure. If I don't have this procedure, my other choices are: Not to perform the procedure    2.      The care provider has told me the risks (problems that can happen) of the procedure. I understand there may be unwanted results. The risks that are related to this procedure include: Bleeding, infection, allergic reaction to contrast dye, injury to surrounding organs and structures, and in very rare circumstances death.    3.      I understand that during the procedure, my care provider may find a condition that we didn't know about before the treatment started. Therefore, I agree that my care provider can perform any other treatment which they  think is necessary and available.    4.      I understand the care provider may remove tissue, body parts, or materials during this procedure. These materials may be used to help with my diagnosis and treatment. They might also be used for teaching purposes or for research studies that I have separately agreed to participate in. Otherwise they will be disposed of as required by law.    5.     My care provider might want a representative from a [employer] to be there during my procedure. I understand that person works for:           The ways they might help my care provider during my procedure include:               6.     Here are my decisions about receiving blood, blood products, or tissues. I understand my decisions cover the time  before, during and after my procedure, my treatment, and my time in the hospital. After my procedure, if my condition changes a lot, my care provider will talk with me again about receiving blood or blood products. At that time, my care provider might need me to review and sign another consent form, about getting or refusing blood.    I understand that the blood is from the community blood supply. Volunteers donated the blood, the volunteers were screened for health problems. The blood was examined with very sensitive and accurate tests to look for hepatitis, HIV/AIDS, and other diseases. Before I receive blood, it is tested again to make sure it is the correct type.    My chances of getting a sickness from blood products are small. But no transfusion is 100% safe. I understand that my care provider feels the good I will receive from the blood is greater than the chances of something going wrong. My care provider has answered my questions about blood products.        My decision           about blood or      blood products                   My decision     about tissue    implants                    I understand this form.       My care provider or his/her assistants  have explained:        What I am having done and why I need it.     What other choices I can make instead of having this done.     The benefits and possible risks (problems) to me of having this done.     The benefits and possible risks (problems) to me of receiving transplants, blood, or blood products.     There is no guarantee of the results     The care provider may not stay with me the entire time that I am in the operating or procedure room. My provider has explained how this may affect my procedure. My provider has answered my questions about this.         I give my permission for this surgery or procedure.                                                                                                                My signature  (or parent or other person authorized to sign for you, if you are unable to sign for yourself or if you are under 33 years old)                           Date                       Time   Electronic Signatures will  display at the bottom of the consent form.    Care provider's statement: I have discussed the planned procedure, including the possibility for transfusion of blood products or receipt of tissue as necessary; expected benefits; the possible complications and risks; and possible alternatives and their benefits and risks with the patients or the patient's surrogate. In my opinion, the patient or the patient's surrogate understands the proposed procedure, its risks, benefits and alternatives.              Electronically signed by:    Vassie Moment, MD                                                02/12/2018         Date        10:35 AM        Time

## 2018-02-12 NOTE — Anesthesia Case Conclusion (Signed)
CASE CONCLUSION  [facility]  Actions:  LMA removed  Criteria Used for Airway Removal:  Adequate Tv & RR and acceptable O2 saturation  Assessment:  Routine  Transport  Directly to: PACU  Airway:  Nasal cannula  Oxygen Delivery:  2 lpm  Monitoring:  Pulse oximetry  Position:  Recumbent  Patient Condition on Handoff  Level of Consciousness:  Alert/talking/calm  Patient Condition:  Stable  Handoff Report to:  RN  Comments: Transferred to PACU without complication. VSS on arrival. Remained 99-100% on X2 while on pacu hold.

## 2018-02-12 NOTE — Discharge Instructions (Signed)
Tunneled Central Line Discharge Instructions   02/12/2018  1:56 PM    Provider performing test/procedure: Dr. Alonza Smoker    Patient received medication? Yes. You have been given medicine, General Anesthesia, that may make you sleepy. Do not drive, operate  heavy machinery, drink alcoholic beverages, make important personal or business decisions, or sign legal documents until the next day.     You must be with a responsible adult the first night after your procedure.   You can eat your usual diet.   Rest for the remainder of the day. Return to normal activities in 24 hours.   You may not shower or get the dressing wet.   Do not remove the bandage from the catheter site. If bleeding occurs at the catheter site, apply firm pressure to the site for 10 minutes. If the bandage is soaked with blood, apply pressure and call your doctor immediately.   Resume your usual medications.   If you take warfarin (Coumadin), check with your primary doctor before restarting this medicine.   Do not take aspirin (including Anacin), Ibuprofen (Advil,Motrin,Nuprin) or naproxen sodium (Aleve) for the next 24 to 48 hours unless you directed by your primary doctor.   Acetaminophen (Tylenol) is the medication of choice if you have pain.     Call Your Doctor IF:   You have signs of infection at the catheter site: fever, chills, severe pain or swelling, foul smelling drainage.   If your catheter gets pulled out.    Please call [facility] Interventional Radiology for questions or concerns regarding your procedure.  Monday-Friday from 7am- 6pm call [phone].  After hours and weekends you may call [phone] and speak to the Resident on-call. The Emergency Department is open 24 hours a day if emergency treatment is required.

## 2018-02-12 NOTE — Progress Notes (Addendum)
[employer] Nursing Procedure Note    Shane Pearson  [id number]    Procedure: TCL w Cavagram        Status: Completed    Patient tolerated procedure well     Specimen Collection: no  Sponge count: N/A  Fluid Removed:N/A  Procedure Dressing Site located:Left Chest  Dressing Type:Tegaderm, sutures  Tube labeled:N/A  Biopatch:Yes  Dressing status:Clean, dry and intact   Hematoma:Noted- physician aware  Medication received:See Anesthesia note  Cardiovascular:   Peripheral Pulses: N/A    Fistula: N/A  Neuro Assessment:N/A  Implant patient information given to patient or parent/guardian:N/A    Report given to:Unit Nurse. Unit PACU Name        Last Filed Vitals    02/12/18 1004   BP: 99/60   Pulse: 71   Resp: 16   Temp: 35.6 C (96.1 F)   SpO2: 96%

## 2018-02-12 NOTE — Procedures (Signed)
Procedure Report           Time out documentation completed prior to procedure:  Yes    Indications/Pre-Procedure diagnosis:  Malfunctioning dialysis catheter concern for venous stenosis/fibrin sheath.    Procedure performed: IR tunneled central line placement (e.g. 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 Exchange for new left IJ tunneled 62F CentrosFlo dialysis catheter.     Complications:  None    Condition:  good    EBL: <10 cc    Specimens:  no    Operators: Dr. Launa Flight and Dr. 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 Exchange for new left IJ tunneled 62F CentrosFlo dialysis catheter. Catheter is ready for use.    Launa Flight, MD  02/12/2018  1:48 PM

## 2018-02-20 ENCOUNTER — Ambulatory Visit: Payer: Medicare Other

## 2018-02-25 LAB — UNMAPPED LAB RESULTS
Basophil # (HT): 0 10 3/uL — NL (ref 0.0–0.2)
Basophil % (HT): 0 % — NL (ref 0–2)
Eosinophil # (HT): 0 10 3/uL — NL (ref 0.0–0.5)
Eosinophil % (HT): 0 % — NL (ref 0–7)
Hematocrit (HT): 40 % — NL (ref 40–52)
Hemoglobin (HGB) (HT): 12.5 g/dL — ABNORMAL LOW (ref 13.0–17.5)
Lymphocyte # (HT): 2 10 3/uL — NL (ref 0.9–3.8)
Lymphocyte % (HT): 9 % — ABNORMAL LOW (ref 17–44)
MCHC (HT): 31.6 g/dL — ABNORMAL LOW (ref 32.0–36.0)
MCV (HT): 85 fL — NL (ref 81–99)
Mean Corpuscular Hemoglobin (MCH) (HT): 27 pg — NL (ref 26.0–34.0)
Monocyte # (HT): 1.8 10 3/uL — ABNORMAL HIGH (ref 0.2–1.0)
Monocyte % (HT): 9 % — NL (ref 4–15)
Neutrophil # (HT): 17.1 10 3/uL — ABNORMAL HIGH (ref 1.5–7.7)
Platelets (HT): 362 10 3/uL — NL (ref 140–400)
RBC (HT): 4.63 10 6/uL — NL (ref 4.60–6.20)
RDW (HT): 16.5 % — ABNORMAL HIGH (ref 11.5–15.0)
Seg Neut % (HT): 81 % — ABNORMAL HIGH (ref 43–75)
WBC (HT): 21.1 10 3/uL — ABNORMAL HIGH (ref 4.0–10.0)

## 2018-02-26 LAB — UNMAPPED LAB RESULTS
HBV S Ag (HT): NEGATIVE — NL
Hematocrit (HT): 39 % — ABNORMAL LOW (ref 40–52)
Hemoglobin (HGB) (HT): 12.5 g/dL — ABNORMAL LOW (ref 13.0–17.5)
MCHC (HT): 31.7 g/dL — ABNORMAL LOW (ref 32.0–36.0)
MCV (HT): 86 fL — NL (ref 81–99)
Mean Corpuscular Hemoglobin (MCH) (HT): 27.4 pg — NL (ref 26.0–34.0)
Platelets (HT): 374 10 3/uL — NL (ref 140–400)
RBC (HT): 4.57 10 6/uL — ABNORMAL LOW (ref 4.60–6.20)
RDW (HT): 16.3 % — ABNORMAL HIGH (ref 11.5–15.0)
WBC (HT): 12.3 10 3/uL — ABNORMAL HIGH (ref 4.0–10.0)

## 2018-02-28 LAB — UNMAPPED LAB RESULTS
Hematocrit (HT): 41 % — NL (ref 40–52)
Hemoglobin (HGB) (HT): 13 g/dL — NL (ref 13.0–17.5)
MCHC (HT): 31.9 g/dL — ABNORMAL LOW (ref 32.0–36.0)
MCV (HT): 86 fL — NL (ref 81–99)
Mean Corpuscular Hemoglobin (MCH) (HT): 27.3 pg — NL (ref 26.0–34.0)
Platelets (HT): 365 10 3/uL — NL (ref 140–400)
RBC (HT): 4.76 10 6/uL — NL (ref 4.60–6.20)
RDW (HT): 16.3 % — ABNORMAL HIGH (ref 11.5–15.0)
WBC (HT): 13.1 10 3/uL — ABNORMAL HIGH (ref 4.0–10.0)

## 2018-03-11 ENCOUNTER — Encounter: Payer: Medicare Other | Admitting: Nephrology

## 2018-03-11 DIAGNOSIS — N186 End stage renal disease: Secondary | ICD-10-CM

## 2018-03-11 DIAGNOSIS — Z992 Dependence on renal dialysis: Secondary | ICD-10-CM

## 2018-03-13 ENCOUNTER — Other Ambulatory Visit: Payer: Self-pay | Admitting: Transplant

## 2018-03-13 ENCOUNTER — Encounter: Payer: Self-pay | Admitting: Transplant

## 2018-03-13 DIAGNOSIS — Z992 Dependence on renal dialysis: Secondary | ICD-10-CM

## 2018-03-13 DIAGNOSIS — N186 End stage renal disease: Secondary | ICD-10-CM

## 2018-03-18 ENCOUNTER — Other Ambulatory Visit: Payer: Medicare Other

## 2018-03-20 ENCOUNTER — Other Ambulatory Visit: Payer: Self-pay | Admitting: Nephrology

## 2018-03-20 ENCOUNTER — Other Ambulatory Visit
Admission: RE | Admit: 2018-03-20 | Discharge: 2018-03-20 | Disposition: A | Discharge status: Home / Self Care | Payer: Medicare Other | Source: Ambulatory Visit | Attending: Nephrology | Admitting: Nephrology

## 2018-03-20 DIAGNOSIS — R791 Abnormal coagulation profile: Secondary | ICD-10-CM

## 2018-03-20 LAB — HEPATIC FUNCTION PANEL
ALT: 23 U/L (ref 0–50)
AST: 21 U/L (ref 0–50)
Albumin: 5.1 g/dL (ref 3.5–5.2)
Alk Phos: 496 U/L — ABNORMAL HIGH (ref 40–130)
Bilirubin,Direct: <0.2 mg/dL (ref 0.0–0.3)
Bilirubin,Total: 0.3 mg/dL (ref 0.0–1.2)
Total Protein: 8.1 g/dL — ABNORMAL HIGH (ref 6.3–7.7)

## 2018-03-20 LAB — PLATELET COUNT: Platelets: 349 10*3/uL — ABNORMAL HIGH (ref 150–330)

## 2018-03-20 LAB — PROTIME-INR
INR: 7.5 (ref 0.9–1.1)
Protime: 86.9 s — ABNORMAL HIGH (ref 10.0–12.9)

## 2018-03-20 NOTE — Progress Notes (Signed)
Notified by Daniel Nones HD Charge RN that INR drawn at [facility] HD unit this AM and sent to [facility] found to be very high at 7.5 (drawn in anticipation of HDTC exchange). Unclear why this is the case as patient does not appear to be on coumadin and has previously been normal (except 2.3 on 02/03/18). Per HD RN, he received HD today without issue, reported mild cramping.    Unclear if INR 7.5 is lab error from heparin in [place]?  - Will order repeat INR, aPTT, CBC, and LFTs STAT  - HD RN to contact patient to have STAT labs drawn today at [facility] lab and advise patient to go to ED if signs of bleeding, headache, lightheadedness, etc. In interim    Shane Evrard Christen Butter, MD 03/20/2018 at 12:14 PM

## 2018-03-21 ENCOUNTER — Telehealth: Payer: Self-pay | Admitting: Cardiology

## 2018-03-21 LAB — PROTIME-INR
INR: 1.1 (ref 0.9–1.1)
Protime: 12.9 s (ref 10.0–12.9)

## 2018-03-21 LAB — CBC AND DIFFERENTIAL
Baso # K/uL: 0 10*3/uL (ref 0.0–0.1)
Basophil %: 0.3 %
Eos # K/uL: 0.3 10*3/uL (ref 0.0–0.5)
Eosinophil %: 3.4 %
Hematocrit: 44 % (ref 40–51)
Hemoglobin: 13.2 g/dL — ABNORMAL LOW (ref 13.7–17.5)
IMM Granulocytes #: 0 10*3/uL
IMM Granulocytes: 0.2 %
Lymph # K/uL: 1.8 10*3/uL (ref 1.3–3.6)
Lymphocyte %: 18.9 %
MCH: 26 pg/cell (ref 26–32)
MCHC: 30 g/dL — ABNORMAL LOW (ref 32–37)
MCV: 88 fL (ref 79–92)
Mono # K/uL: 1 10*3/uL — ABNORMAL HIGH (ref 0.3–0.8)
Monocyte %: 10.4 %
Neut # K/uL: 6.5 10*3/uL — ABNORMAL HIGH (ref 1.8–5.4)
Nucl RBC # K/uL: 0 10*3/uL (ref 0.0–0.0)
Nucl RBC %: 0 /100 WBC (ref 0.0–0.2)
Platelets: 363 10*3/uL — ABNORMAL HIGH (ref 150–330)
RBC: 5 MIL/uL (ref 4.6–6.1)
RDW: 17.4 % — ABNORMAL HIGH (ref 11.6–14.4)
Seg Neut %: 66.8 %
WBC: 9.7 10*3/uL — ABNORMAL HIGH (ref 4.2–9.1)

## 2018-03-21 LAB — APTT: aPTT: 36.3 s (ref 25.8–37.9)

## 2018-03-21 NOTE — Telephone Encounter (Signed)
Left message for Pt to call back to confirm appointment for TCL change on 1/15 at 9:30 am.

## 2018-03-26 ENCOUNTER — Ambulatory Visit
Admission: RE | Admit: 2018-03-26 | Discharge: 2018-03-26 | Disposition: A | Discharge status: Home / Self Care | Payer: Medicare Other | Source: Ambulatory Visit | Attending: Radiology | Admitting: Radiology

## 2018-03-26 DIAGNOSIS — R001 Bradycardia, unspecified: Secondary | ICD-10-CM

## 2018-03-26 DIAGNOSIS — Z992 Dependence on renal dialysis: Secondary | ICD-10-CM | POA: Insufficient documentation

## 2018-03-26 DIAGNOSIS — N186 End stage renal disease: Secondary | ICD-10-CM | POA: Insufficient documentation

## 2018-03-26 DIAGNOSIS — I509 Heart failure, unspecified: Secondary | ICD-10-CM | POA: Insufficient documentation

## 2018-03-26 DIAGNOSIS — Y831 Surgical operation with implant of artificial internal device as the cause of abnormal reaction of the patient, or of later complication, without mention of misadventure at the time of the procedure: Secondary | ICD-10-CM | POA: Insufficient documentation

## 2018-03-26 DIAGNOSIS — I959 Hypotension, unspecified: Secondary | ICD-10-CM | POA: Insufficient documentation

## 2018-03-26 DIAGNOSIS — T82828A Fibrosis of vascular prosthetic devices, implants and grafts, initial encounter: Secondary | ICD-10-CM

## 2018-03-26 DIAGNOSIS — K219 Gastro-esophageal reflux disease without esophagitis: Secondary | ICD-10-CM | POA: Insufficient documentation

## 2018-03-26 MED ORDER — MIDAZOLAM HCL 1 MG/ML IJ SOLN *I* WRAPPED
Freq: Once | INTRAMUSCULAR | Status: AC | PRN
Start: 2018-03-26 — End: 2018-03-26
  Administered 2018-03-26: .5 mg via INTRAVENOUS

## 2018-03-26 MED ORDER — FENTANYL CITRATE 50 MCG/ML IJ SOLN *WRAPPED*
Freq: Once | INTRAMUSCULAR | Status: AC | PRN
Start: 2018-03-26 — End: 2018-03-26
  Administered 2018-03-26: 25 ug via INTRAVENOUS

## 2018-03-26 MED ORDER — FENTANYL CITRATE 50 MCG/ML IJ SOLN *WRAPPED*
Freq: Once | INTRAMUSCULAR | Status: AC | PRN
Start: 2018-03-26 — End: 2018-03-26
  Administered 2018-03-26: 12.5 ug via INTRAVENOUS

## 2018-03-26 MED ORDER — HEPARIN SODIUM (PORCINE) 1000 UNIT/ML IJ SOLN *WRAPPED*
Freq: Once | Status: AC | PRN
Start: 2018-03-26 — End: 2018-03-26
  Administered 2018-03-26: 1.6 mL via INTRAVENOUS

## 2018-03-26 MED ORDER — IOHEXOL 240 MG/ML (OMNIPAQUE) IV SOLN *I*
Freq: Once | INTRAMUSCULAR | Status: AC | PRN
Start: 2018-03-26 — End: 2018-03-26
  Administered 2018-03-26: 40 mL via INTRAVENOUS

## 2018-03-26 MED ORDER — VANCOMYCIN HCL IN NACL 1000 MG/200 ML IV SOLN *I*
INTRAVENOUS | Status: AC | PRN
Start: 2018-03-26 — End: 2018-03-26
  Administered 2018-03-26: 500 mg via INTRAVENOUS

## 2018-03-26 NOTE — Preop H&P (Signed)
OUTPATIENT  Chief Complaint: malfunction dialysis catheter.    History of Present Illness:  HPI    Past Medical History:   Diagnosis Date    Arthritis     CHF (congestive heart failure)     Colon polyp     Dialysis patient     ESRD (end stage renal disease)     GERD (gastroesophageal reflux disease)     Hypercholesterolemia     Hypotension     Ischemic colitis     Thyroid disease     hypo     Past Surgical History:   Procedure Laterality Date    Right inguinal hernia repair       Family History   Problem Relation Age of Onset    Diabetes Mother     Asthma Mother     Coronary artery disease Father      Social History     Socioeconomic History    Marital status: Married     Spouse name: Not on file    Number of children: Not on file    Years of education: Not on file    Highest education level: Not on file   Tobacco Use    Smoking status: Former Smoker     Packs/day: 0.30     Years: 15.00     Pack years: 4.50     Types: Cigarettes     Last attempt to quit: 08/02/1992     Years since quitting: 25.6    Smokeless tobacco: Never Used   Substance and Sexual Activity    Alcohol use: No    Drug use: No    Sexual activity: Not on file   Other Topics Concern    Not on file   Social History Narrative    Not on file       Allergies:   Allergies   Allergen Reactions    Penicillins Swelling     Face    Coconut Flavor Nausea And Vomiting    Amoxicillin Rash    Baclofen Other (See Comments)     confusion    No Known Latex Allergy        Current Outpatient Medications   Medication    sevelamer (RENVELA) 800 MG tablet    acetaminophen (TYLENOL) 325 MG tablet    aspirin 81 MG EC tablet    midodrine (PROAMATINE) 10 MG tablet    fluticasone (FLONASE) 50 MCG/ACT nasal spray    atorvastatin (LIPITOR) 10 MG tablet    fludrocortisone (FLORINEF) 0.1 mg tablet    b complex-vitamin c-folic acid (NEPHRO-VITE) tablet    calcium-vitamin D (OSCAL-500) 500-200 MG-UNIT per tablet    cinacalcet (SENSIPAR) 30 MG  tablet    omeprazole (PRILOSEC) 20 MG capsule    minocycline (MINOCIN) 100 MG capsule     No current facility-administered medications for this encounter.         Review of Systems:   ROS    Last Nursing documented pain:  0-10 Scale: 0 (03/26/18 0955)      Patient Vitals for the past 24 hrs:   BP Temp Pulse SpO2 Height Weight   03/26/18 0955 97/58 36.1 C (97 F) 79 96 % 165.1 cm (5\' 5" ) 88.9 kg (196 lb)            Physical Exam    Lab Results:   none    Radiology impressions (last 3 days):  No results found.    Currently Active/Followed Hospital Problems:  There  are no active hospital problems to display for this patient.      Assessment:     Plan: dialysis catheter change    Author: Lyman Bishop, MD  Note created: 03/26/2018  at: 10:03 AM

## 2018-03-26 NOTE — Procedures (Signed)
Procedure Report           Time out documentation completed prior to procedure:  Yes    Indications/Pre-Procedure diagnosis:  Malfunction dialysis catheter  Procedure performed: IR tunneled central line placement (e.g. dialysis, apheresis cath)    Guide Wire Removed:Yes    Findings/Procedure Summary (detailed report located in the Image tab): venoplasty of SVC with 12 and 14 mm balloons and placement of Palindrome catheter    Complications:  none    Condition:  good    EBL: 20 mL    Specimens:  N/A    Operators: Dr. Lyman Bishop    Disposition:  stable    Post-Procedure Diagnosis: same    Lyman Bishop, MD  03/26/2018  11:16 AM

## 2018-03-26 NOTE — Discharge Instructions (Signed)
Tunneled Central Line Discharge Instructions   03/26/2018  11:40 AM    Provider performing test/procedure: Dr. Lyman Bishop    Patient received medication? Yes. You have been given medicine, Versed and Fentanyl, that may make you sleepy. Do not drive, operate  heavy machinery, drink alcoholic beverages, make important personal or business decisions, or sign legal documents until the next day.     You must be with a responsible adult the first night after your procedure.   You can eat your usual diet.   Rest for the remainder of the day. Return to normal activities in 24 hours.   You may not shower or get the dressing wet.   Do not remove the bandage from the catheter site. If bleeding occurs at the catheter site, apply firm pressure to the site for 10 minutes. If the bandage is soaked with blood, apply pressure and call your doctor immediately.   Resume your usual medications.   Do not take aspirin (including Anacin), Ibuprofen (Advil,Motrin,Nuprin) or naproxen sodium (Aleve) for the next 24 to 48 hours unless you directed by your primary doctor.   Acetaminophen (Tylenol) is the medication of choice if you have pain.     Call Your Doctor IF:   You have signs of infection at the catheter site: fever, chills, severe pain or swelling, foul smelling drainage.   If your catheter gets pulled out.    Please call [place] [facility] [employer] for questions or concerns regarding your procedure.  Monday-Friday from 8am-5pm call [phone]. After hours and weekends you may call [phone] and speak to the Resident on-call. The Emergency Department is open 24 hours a day if emergency treatment is required.

## 2018-03-26 NOTE — Progress Notes (Addendum)
[employer] [employer] Nursing Procedure Note    Shane Pearson  [id number]    Procedure: Dialysis catheter with ballooning        Status: Completed    Patient experienced the following problems intraprocedurally change in VS when ballooning with 34F- see note     Specimen Collection: no  Sponge count: N/A  Fluid Removed:N/A  Procedure Dressing Site located:Left IJ and chest wall  Dressing Type:sterile gauze/tegaderm and suture  Tube labeled:N/A  Biopatch:Yes  Dressing status:Clean, dry and intact   Hematoma:Not evident  Medication received:Versed 0.5mg , Fentanyl 37.98mcg and vanco 500 mg and CT contrast  Cardiovascular:   Peripheral Pulses: N/A    Fistula: N/A  Neuro Assessment:Patient is at pre-procedure baseline  Implant patient information given to patient or parent/guardian:Yes    Report given NB:[facility] recovery nurse. Name: Vianne Bulls and Jenny Reichmann      Last Filed Vitals    03/26/18 1116   BP: 97/68   Pulse: 63   Resp: 22   Temp:    SpO2: 99%

## 2018-03-26 NOTE — Invasive Procedure Plan of Care (Signed)
[place]  OR SURGICAL PROCEDURE                            Patient Name: Shane Pearson  [facility] [address]                                                           DOB: 18-Feb-1961     Please read this form or have someone read it to you.   It's important to understand all parts of this form. If something isn't clear, ask [place] to explain.   When you sign it, that means you understand the form and give [place] permission to do this surgery or procedure.       I agree for Lyman Bishop, MD, and Vascular and Interventional Radiology Attendings, Fellows, Residents and Advanced Practice Providers   along with any assistants* they may choose, to treat the following condition(s): Need for moderate sedation during procedure   By doing this surgery or procedure on me: Establishment of moderate sedation   This is also known as:    Laterality: Not applicable     *if you'd like a list of the assistants, please ask. We can give that to you.      1.      The care provider has explained my condition to me. They have told me how the procedure can help me. They have told me about other ways of treating my condition. I understand the care provider cannot guarantee the result of the procedure. If I don't have this procedure, my other choices are: Not undergoing procedure, undergoing procedure without sedation.    2.      The care provider has told me the risks (problems that can happen) of the procedure. I understand there may be unwanted results. The risks that are related to this procedure include: Excessive sedation, which may require special procedures to keep you safe.  Nausea, vomiting, problems with heart rate or blood pressure, breathing difficilties, very rarely death.    3.      I understand that during the procedure, my care provider may find a condition that we didn't know about before the treatment started. Therefore, I agree that my care provider can  perform any other treatment which they think is necessary and available.    4.      I understand the care provider may remove tissue, body parts, or materials during this procedure. These materials may be used to help with my diagnosis and treatment. They might also be used for teaching purposes or for research studies that I have separately agreed to participate in. Otherwise they will be disposed of as required by law.    5.     My care provider might want a representative from a [place] to be there during my procedure. I understand that person works for:           The ways they might help my care provider during my procedure include:               6.     Here are my decisions about receiving blood, blood products, or tissues. I  understand my decisions cover the time before, during and after my procedure, my treatment, and my time in the hospital. After my procedure, if my condition changes a lot, my care provider will talk with me again about receiving blood or blood products. At that time, my care provider might need me to review and sign another consent form, about getting or refusing blood.    I understand that the blood is from the community blood supply. Volunteers donated the blood, the volunteers were screened for health problems. The blood was examined with very sensitive and accurate tests to look for hepatitis, HIV/AIDS, and other diseases. Before I receive blood, it is tested again to make sure it is the correct type.    My chances of getting a sickness from blood products are small. But no transfusion is 100% safe. I understand that my care provider feels the good I will receive from the blood is greater than the chances of something going wrong. My care provider has answered my questions about blood products.        My decision           about blood or      blood products                   My decision     about tissue    implants                    I understand this form.       My  care provider or his/her assistants have explained:        What I am having done and why I need it.     What other choices I can make instead of having this done.     The benefits and possible risks (problems) to me of having this done.     The benefits and possible risks (problems) to me of receiving transplants, blood, or blood products.     There is no guarantee of the results     The care provider may not stay with me the entire time that I am in the operating or procedure room. My provider has explained how this may affect my procedure. My provider has answered my questions about this.         I give my permission for this surgery or procedure.                                                                                                                My signature  (or parent or other person authorized to sign for you, if you are unable to sign for yourself or if you are under 34 years old)                           Date  Time   Electronic Signatures will display at the bottom of the consent form.    Care provider's statement: I have discussed the planned procedure, including the possibility for transfusion of blood products or receipt of tissue as necessary; expected benefits; the possible complications and risks; and possible alternatives and their benefits and risks with the patients or the patient's surrogate. In my opinion, the patient or the patient's surrogate understands the proposed procedure, its risks, benefits and alternatives.              Electronically signed by:    Lyman Bishop, MD                                                03/26/2018         Date        9:53 AM        Time

## 2018-03-26 NOTE — Progress Notes (Signed)
Patient to Recovery Awake and alert taking PO without difficulty. VSS. Left Chest Dialysis Catheter intact without drainage, swelling.

## 2018-03-26 NOTE — Invasive Procedure Plan of Care (Signed)
[place]  OR SURGICAL PROCEDURE                            Patient Name: Shane Pearson  [facility] [address]                                                           DOB: December 31, 1960     Please read this form or have someone read it to you.   It's important to understand all parts of this form. If something isn't clear, ask [place] to explain.   When you sign it, that means you understand the form and give [place] permission to do this surgery or procedure.       I agree for Lyman Bishop, MD along with any assistants* they may choose, to treat the following condition(s): Need for central vascular access.   By doing this surgery or procedure on me: Change of central venous catheter.   This is also known as:    Laterality:     *if you'd like a list of the assistants, please ask. We can give that to you.      1.      The care provider has explained my condition to me. They have told me how the procedure can help me. They have told me about other ways of treating my condition. I understand the care provider cannot guarantee the result of the procedure. If I don't have this procedure, my other choices are: Attempting alternative monitoring techniques and administration of fluids and medications through a peripheral IV.    2.      The care provider has told me the risks (problems that can happen) of the procedure. I understand there may be unwanted results. The risks that are related to this procedure include: Bleeding, injury to vessel, infection, blood clots, need for repeated procedures, abnormal heart rhythyms, collapsed lung. In rare circumstances this procedure may result in death.     3.      I understand that during the procedure, my care provider may find a condition that we didn't know about before the treatment started. Therefore, I agree that my care provider can perform any other treatment which they think is necessary and available.    4.       I understand the care provider may remove tissue, body parts, or materials during this procedure. These materials may be used to help with my diagnosis and treatment. They might also be used for teaching purposes or for research studies that I have separately agreed to participate in. Otherwise they will be disposed of as required by law.    5.     My care provider might want a representative from a [place] to be there during my procedure. I understand that person works for:           The ways they might help my care provider during my procedure include:               6.     Here are my decisions about receiving blood, blood products, or tissues. I understand my decisions cover the time before, during and  after my procedure, my treatment, and my time in the hospital. After my procedure, if my condition changes a lot, my care provider will talk with me again about receiving blood or blood products. At that time, my care provider might need me to review and sign another consent form, about getting or refusing blood.    I understand that the blood is from the community blood supply. Volunteers donated the blood, the volunteers were screened for health problems. The blood was examined with very sensitive and accurate tests to look for hepatitis, HIV/AIDS, and other diseases. Before I receive blood, it is tested again to make sure it is the correct type.    My chances of getting a sickness from blood products are small. But no transfusion is 100% safe. I understand that my care provider feels the good I will receive from the blood is greater than the chances of something going wrong. My care provider has answered my questions about blood products.        My decision           about blood or      blood products      Not applicable.             My decision     about tissue    implants      Not applicable.               I understand this form.       My care provider or his/her assistants have explained:         What I am having done and why I need it.     What other choices I can make instead of having this done.     The benefits and possible risks (problems) to me of having this done.     The benefits and possible risks (problems) to me of receiving transplants, blood, or blood products.     There is no guarantee of the results     The care provider may not stay with me the entire time that I am in the operating or procedure room. My provider has explained how this may affect my procedure. My provider has answered my questions about this.         I give my permission for this surgery or procedure.                                                                                                                My signature  (or parent or other person authorized to sign for you, if you are unable to sign for yourself or if you are under 52 years old)                           Date                       Time   Electronic Signatures will  display at the bottom of the consent form.    Care provider's statement: I have discussed the planned procedure, including the possibility for transfusion of blood products or receipt of tissue as necessary; expected benefits; the possible complications and risks; and possible alternatives and their benefits and risks with the patients or the patient's surrogate. In my opinion, the patient or the patient's surrogate understands the proposed procedure, its risks, benefits and alternatives.              Electronically signed by:    Lyman Bishop, MD                                                03/26/2018         Date        9:51 AM        Time

## 2018-04-11 ENCOUNTER — Encounter: Payer: Medicare Other | Admitting: Nephrology

## 2018-04-11 DIAGNOSIS — N186 End stage renal disease: Secondary | ICD-10-CM

## 2018-04-11 DIAGNOSIS — Z992 Dependence on renal dialysis: Secondary | ICD-10-CM

## 2018-07-10 ENCOUNTER — Encounter: Payer: Medicare Other | Admitting: Nephrology

## 2018-07-10 DIAGNOSIS — Z992 Dependence on renal dialysis: Secondary | ICD-10-CM

## 2018-07-10 DIAGNOSIS — N186 End stage renal disease: Secondary | ICD-10-CM

## 2018-08-08 ENCOUNTER — Other Ambulatory Visit
Admission: RE | Admit: 2018-08-08 | Discharge: 2018-08-08 | Disposition: A | Discharge status: Home / Self Care | Payer: Medicare Other | Source: Ambulatory Visit | Attending: Dermatopathology | Admitting: Dermatopathology

## 2018-08-08 DIAGNOSIS — L988 Other specified disorders of the skin and subcutaneous tissue: Secondary | ICD-10-CM | POA: Insufficient documentation

## 2018-08-08 DIAGNOSIS — L821 Other seborrheic keratosis: Secondary | ICD-10-CM

## 2018-08-08 DIAGNOSIS — L905 Scar conditions and fibrosis of skin: Secondary | ICD-10-CM | POA: Insufficient documentation

## 2018-08-10 ENCOUNTER — Encounter: Payer: Medicare Other | Admitting: Nephrology

## 2018-08-10 DIAGNOSIS — Z992 Dependence on renal dialysis: Secondary | ICD-10-CM

## 2018-08-10 DIAGNOSIS — N186 End stage renal disease: Secondary | ICD-10-CM

## 2018-08-12 LAB — SURGICAL PATHOLOGY

## 2018-09-09 ENCOUNTER — Encounter: Payer: Medicare Other | Admitting: Nephrology

## 2018-09-09 DIAGNOSIS — N186 End stage renal disease: Secondary | ICD-10-CM

## 2018-09-09 DIAGNOSIS — Z992 Dependence on renal dialysis: Secondary | ICD-10-CM

## 2018-10-10 ENCOUNTER — Encounter: Payer: Medicare Other | Admitting: Nephrology

## 2018-10-10 DIAGNOSIS — Z992 Dependence on renal dialysis: Secondary | ICD-10-CM

## 2018-10-10 DIAGNOSIS — N186 End stage renal disease: Secondary | ICD-10-CM

## 2018-11-10 ENCOUNTER — Encounter: Payer: Medicare Other | Admitting: Nephrology

## 2018-11-10 DIAGNOSIS — N186 End stage renal disease: Secondary | ICD-10-CM

## 2018-11-10 DIAGNOSIS — Z992 Dependence on renal dialysis: Secondary | ICD-10-CM

## 2018-11-12 LAB — UNMAPPED LAB RESULTS
Hematocrit (HT): 40.8 % — NL (ref 40.0–52.0)
Hemoglobin (HGB) (HT): 13.4 g/dL — NL (ref 13.0–17.5)
MCHC (HT): 32.8 g/dL — NL (ref 32.0–36.0)
MCV (HT): 81.8 FL — NL (ref 81.0–99.0)
Mean Corpuscular Hemoglobin (MCH) (HT): 26.9 pg — NL (ref 26.0–34.0)
Platelets (HT): 343 10 3/uL — NL (ref 140–400)
RBC (HT): 4.99 10 6/uL — NL (ref 4.20–5.90)
RDW (HT): 16 % — ABNORMAL HIGH (ref 11.5–15.0)
WBC (HT): 7.4 10 3/uL — NL (ref 4.0–10.8)

## 2018-12-10 ENCOUNTER — Encounter: Payer: Medicare Other | Admitting: Nephrology

## 2018-12-10 DIAGNOSIS — N186 End stage renal disease: Secondary | ICD-10-CM

## 2018-12-10 DIAGNOSIS — Z992 Dependence on renal dialysis: Secondary | ICD-10-CM

## 2019-01-10 ENCOUNTER — Encounter: Payer: Medicare Other | Admitting: Nephrology

## 2019-01-10 DIAGNOSIS — Z992 Dependence on renal dialysis: Secondary | ICD-10-CM

## 2019-01-10 DIAGNOSIS — N186 End stage renal disease: Secondary | ICD-10-CM

## 2019-01-17 ENCOUNTER — Other Ambulatory Visit
Admission: RE | Admit: 2019-01-17 | Discharge: 2019-01-17 | Disposition: A | Discharge status: Home / Self Care | Payer: Medicare Other | Source: Ambulatory Visit

## 2019-01-17 ENCOUNTER — Emergency Department: Payer: Medicare Other

## 2019-01-17 ENCOUNTER — Other Ambulatory Visit: Payer: Self-pay | Admitting: Cardiovascular Disease

## 2019-01-17 ENCOUNTER — Emergency Department
Admission: EM | Admit: 2019-01-17 | Discharge: 2019-01-17 | Disposition: A | Discharge status: Home / Self Care | Payer: Medicare Other | Source: Ambulatory Visit | Attending: Emergency Medicine | Admitting: Emergency Medicine

## 2019-01-17 ENCOUNTER — Encounter: Payer: Self-pay | Admitting: Student in an Organized Health Care Education/Training Program

## 2019-01-17 DIAGNOSIS — Y828 Other medical devices associated with adverse incidents: Secondary | ICD-10-CM | POA: Insufficient documentation

## 2019-01-17 DIAGNOSIS — Z789 Other specified health status: Secondary | ICD-10-CM

## 2019-01-17 DIAGNOSIS — Y9289 Other specified places as the place of occurrence of the external cause: Secondary | ICD-10-CM | POA: Insufficient documentation

## 2019-01-17 DIAGNOSIS — Z992 Dependence on renal dialysis: Secondary | ICD-10-CM

## 2019-01-17 DIAGNOSIS — E875 Hyperkalemia: Secondary | ICD-10-CM | POA: Insufficient documentation

## 2019-01-17 DIAGNOSIS — R140 Abdominal distension (gaseous): Secondary | ICD-10-CM

## 2019-01-17 DIAGNOSIS — T8241XA Breakdown (mechanical) of vascular dialysis catheter, initial encounter: Secondary | ICD-10-CM | POA: Insufficient documentation

## 2019-01-17 DIAGNOSIS — I499 Cardiac arrhythmia, unspecified: Secondary | ICD-10-CM

## 2019-01-17 DIAGNOSIS — D631 Anemia in chronic kidney disease: Secondary | ICD-10-CM

## 2019-01-17 DIAGNOSIS — N186 End stage renal disease: Secondary | ICD-10-CM | POA: Insufficient documentation

## 2019-01-17 LAB — PLASMA PROF 7 (ED ONLY)
Anion Gap,PL: 21 — ABNORMAL HIGH (ref 7–16)
CO2,Plasma: 20 mmol/L (ref 20–28)
Chloride,Plasma: 97 mmol/L (ref 96–108)
Creatinine: 13.98 mg/dL — ABNORMAL HIGH (ref 0.67–1.17)
GFR,Black: 4 * — AB
GFR,Caucasian: 3 * — AB
Glucose,Plasma: 85 mg/dL (ref 60–99)
Potassium,Plasma: 4.9 mmol/L — ABNORMAL HIGH (ref 3.4–4.7)
Sodium,Plasma: 138 mmol/L (ref 133–145)
UN,Plasma: 58 mg/dL — ABNORMAL HIGH (ref 6–20)

## 2019-01-17 LAB — CBC AND DIFFERENTIAL
Baso # K/uL: 0.1 10*3/uL (ref 0.0–0.1)
Basophil %: 0.6 %
Eos # K/uL: 0.5 10*3/uL (ref 0.0–0.5)
Eosinophil %: 5.3 %
Hematocrit: 42 % (ref 40–51)
Hemoglobin: 12.2 g/dL — ABNORMAL LOW (ref 13.7–17.5)
IMM Granulocytes #: 0 10*3/uL (ref 0.0–0.0)
IMM Granulocytes: 0.2 %
Lymph # K/uL: 2 10*3/uL (ref 1.3–3.6)
Lymphocyte %: 23.4 %
MCH: 26 pg/cell (ref 26–32)
MCHC: 29 g/dL — ABNORMAL LOW (ref 32–37)
MCV: 90 fL (ref 79–92)
Mono # K/uL: 0.9 10*3/uL — ABNORMAL HIGH (ref 0.3–0.8)
Monocyte %: 10.8 %
Neut # K/uL: 5.1 10*3/uL (ref 1.8–5.4)
Nucl RBC # K/uL: 0 10*3/uL (ref 0.0–0.0)
Nucl RBC %: 0 /100 WBC (ref 0.0–0.2)
Platelets: 321 10*3/uL (ref 150–330)
RBC: 4.6 MIL/uL (ref 4.6–6.1)
RDW: 21.9 % — ABNORMAL HIGH (ref 11.6–14.4)
Seg Neut %: 59.7 %
WBC: 8.5 10*3/uL (ref 4.2–9.1)

## 2019-01-17 LAB — HOLD SST

## 2019-01-17 LAB — POTASSIUM: Potassium: 4.3 mmol/L (ref 3.3–5.1)

## 2019-01-17 LAB — DATE/TIME NOT PROVIDED

## 2019-01-17 LAB — HOLD BLUE

## 2019-01-17 LAB — CALCIUM: Calcium: 9 mg/dL (ref 8.6–10.2)

## 2019-01-17 LAB — MAGNESIUM: Magnesium: 1.6 mg/dL (ref 1.6–2.5)

## 2019-01-17 LAB — UNABLE TO PERFORM ADD-ON TESTING 1

## 2019-01-17 LAB — PHOSPHORUS: Phosphorus: 3 mg/dL (ref 2.7–4.5)

## 2019-01-17 MED ORDER — MIDODRINE HCL 5 MG PO TABS *I*
10.0000 mg | ORAL_TABLET | Freq: Once | ORAL | Status: AC
Start: 2019-01-17 — End: 2019-01-17
  Administered 2019-01-17: 10 mg via ORAL

## 2019-01-17 MED ORDER — HEPARIN SODIUM (PORCINE) 1000 UNIT/ML IJ SOLN *WRAPPED*
0.0000 mL | Status: DC | PRN
Start: 2019-01-17 — End: 2019-01-18
  Administered 2019-01-17: 1600 [IU]

## 2019-01-17 MED ORDER — HEPARIN LOCK FLUSH 10 UNIT/ML IJ SOLN WRAPPED *I*
INTRAVENOUS | Status: AC
Start: 2019-01-17 — End: 2019-01-17
  Filled 2019-01-17: qty 10

## 2019-01-17 MED ORDER — DEXTROSE 5 % FLUSH FOR PUMPS *I*
0.0000 mL/h | INTRAVENOUS | Status: DC | PRN
Start: 2019-01-17 — End: 2019-01-18

## 2019-01-17 MED ORDER — SODIUM CHLORIDE 0.9 % FLUSH FOR PUMPS *I*
0.0000 mL/h | INTRAVENOUS | Status: DC | PRN
Start: 2019-01-17 — End: 2019-01-18

## 2019-01-17 MED ORDER — HEPARIN SODIUM (PORCINE) 1000 UNIT/ML IJ SOLN *WRAPPED*
0.0000 mL | Status: DC | PRN
Start: 2019-01-17 — End: 2019-01-18
  Administered 2019-01-17: 3000 [IU]
  Filled 2019-01-17 (×2): qty 10

## 2019-01-17 MED ORDER — MIDODRINE HCL 5 MG PO TABS *I*
ORAL_TABLET | ORAL | Status: DC
Start: 2019-01-17 — End: 2019-01-18
  Filled 2019-01-17: qty 2

## 2019-01-17 MED ORDER — HEPARIN SODIUM (PORCINE) 1000 UNIT/ML IJ SOLN *WRAPPED*
Status: AC
Start: 2019-01-17 — End: 2019-01-17
  Filled 2019-01-17: qty 10

## 2019-01-17 NOTE — Procedures (Signed)
Procedure Report           Time out documentation completed prior to procedure:  Yes    Indications/Pre-Procedure diagnosis:  Broken HD catheter    Procedure performed:  HD catheter repair    Guide Wire Removed:Yes    Findings/Procedure Summary (detailed report located in the Image tab): see media tab. Broken HD line (red port) at the very distal aspect near the connector. Repair kit used to remove broken end and replace with new connector. Heparin locked the RED port (BLUE port was not interrogated and therefore not heparinized). Okay for use.    Complications:  None    Condition:  good    EBL: None    Specimens:  no    Operators: Dr. Benson Setting    Disposition:  Same    Post-Procedure Diagnosis: Repaired HD catheter    Vassie Moment, MD  01/17/2019  2:05 PM

## 2019-01-17 NOTE — ED Notes (Signed)
Per provider ok to come off tele for travel to dialysis

## 2019-01-17 NOTE — ED Notes (Signed)
ED RN [place] Elaina Hoops, RN (RN) reviewed the following charting information by the RN intern: Radene Knee RN    Nursing Assessments  Medications  Plan of Care  Teaching   Notes    In the chart of Shane Pearson (58 y.o. male) and attest to the charting being accurate.

## 2019-01-17 NOTE — ED Provider Notes (Addendum)
History     Chief Complaint   Patient presents with    Vascular Access Problem       58 yo male with ESRD anuric on TThS dialysis, hypotension, CHF presenting with broken arterial port on central HD catheter. Piece of port chipped while trying to connect port for hemodialysis this AM. Pt found to be hypotensive with SBP 80s and referred to ED to change out catheter with hemodynamic monitoring. Pt states this degree of hypotension is wnl for him and that when dialyzed his SBP can dip into the 70s and 60s. Took his hypertensive medications this AM. Endorses lightheadedness that he states is no worse than usual. Denies syncope, recent falls, f/c, headache, vision change, rhinorrhea, sore throat, cough, dyspnea, chest pain, palpitations, n/v, abdominal pain, changes in BMs.          Medical/Surgical/Family History     Past Medical History:   Diagnosis Date    Arthritis     CHF (congestive heart failure)     Colon polyp     Dialysis patient     ESRD (end stage renal disease)     GERD (gastroesophageal reflux disease)     Hypercholesterolemia     Hypotension     Ischemic colitis     Thyroid disease     hypo        Patient Active Problem List   Diagnosis Code    ESRD (end stage renal disease) on dialysis N18.6, Z99.2    HLD (hyperlipidemia) E78.5    Hypotension I95.9    Ischemic colitis K55.9            Past Surgical History:   Procedure Laterality Date    Right inguinal hernia repair       Family History   Problem Relation Age of Onset    Diabetes Mother     Asthma Mother     Coronary artery disease Father           Social History     Tobacco Use    Smoking status: Former Smoker     Packs/day: 0.30     Years: 15.00     Pack years: 4.50     Types: Cigarettes     Quit date: 08/02/1992     Years since quitting: 26.4    Smokeless tobacco: Never Used   Substance Use Topics    Alcohol use: No    Drug use: No     Living Situation     Questions Responses    Patient lives with Spouse    Homeless      Caregiver for other family member     External Services     Employment Employed    Domestic Violence Risk                 Review of Systems   Review of Systems   Constitutional: Negative for chills and fever.   HENT: Negative for congestion, rhinorrhea, sore throat, trouble swallowing and voice change.    Eyes: Negative for photophobia, redness and visual disturbance.   Respiratory: Negative for cough and shortness of breath.    Cardiovascular: Negative for chest pain and palpitations.   Gastrointestinal: Negative for abdominal distention, constipation, diarrhea, nausea and vomiting.   Genitourinary: Negative.  Negative for dysuria.   Musculoskeletal: Negative for back pain.   Skin: Negative for color change, rash and wound.   Neurological: Negative for dizziness, weakness, light-headedness and numbness.   Hematological: Does not  bruise/bleed easily.   Psychiatric/Behavioral: Negative for confusion.       Physical Exam     Triage Vitals  Triage Start: Start, (01/17/19 6063)   First Recorded BP: (!) 82/43, Resp: 16, Temp: 36.9 C (98.4 F) Oxygen Therapy SpO2: 98 %, O2 Device: None (Room air), Heart Rate: 72, (01/17/19 0924)  .      Physical Exam  Vitals signs and nursing note reviewed.   Constitutional:       Appearance: Normal appearance.   HENT:      Head: Normocephalic and atraumatic.      Right Ear: External ear normal.      Left Ear: External ear normal.      Nose: Nose normal. No congestion.      Mouth/Throat:      Mouth: Mucous membranes are moist.      Pharynx: Oropharynx is clear.   Eyes:      General: Scleral icterus present.      Extraocular Movements: Extraocular movements intact.      Pupils: Pupils are equal, round, and reactive to light.   Neck:      Musculoskeletal: Normal range of motion and neck supple.   Cardiovascular:      Rate and Rhythm: Normal rate and regular rhythm.      Heart sounds: Normal heart sounds.      Comments: Distal extremities arm warm and well-perfused  Pulmonary:      Effort:  Pulmonary effort is normal.      Breath sounds: Normal breath sounds.   Abdominal:      General: Bowel sounds are normal. There is distension.      Palpations: Abdomen is soft.      Tenderness: There is no abdominal tenderness. There is no guarding.   Musculoskeletal:         General: No swelling.      Right lower leg: No edema.      Left lower leg: No edema.   Skin:     General: Skin is warm and dry.      Capillary Refill: Capillary refill takes less than 2 seconds.      Findings: No erythema or rash.      Comments: No erythema, edema or fluctuance overlying HD catheter. Ports examined; arterial port with nailbed-sized chunk of plastic missing.   Neurological:      General: No focal deficit present.      Mental Status: He is alert and oriented to person, place, and time.          Medical Decision Making     Assessment:      58 yo male with ESRD on TThSat dialysis and longstanding hypotension with broken arterial port on HD line, due for HD today. Afebrile, hypotensive at 80s/40s in triage, A&Ox4 with warm and well-perfused distal extremities, BP improved to 90s/60s without intervention. Exam remarkable only for chipped HD port, lungs are clear, no friction rub, no dependent edema, no erythema/fluctuance/tenderness to suggest line infection. Pt endorses lightheadedness without syncope, states this is chronic and unchanged in setting of known baseline hypotension. He needs HD catheter repair vs replacement and dialysis today.    Differential diagnosis:      electrolyte derangement, arrhythmia, HD line compromise w/low suspicion for central line infection at this time, low suspicion of volume overload in absence of clinical symptoms    Plan:    Orders Placed This Encounter      CBC and differential  Plasma profile 7 [facility] ED only)      Hold SST      Hold blue      Calcium      Magnesium      Phosphorus      Calcium      Magnesium      Phosphorus      Continuous telemetry, non-protocol      EKG 12 lead (initial)       Insert peripheral IV      sodium chloride 0.9 % FLUSH REQUIRED IF PATIENT HAS IV      dextrose 5 % FLUSH REQUIRED IF PATIENT HAS IV             ED Course as of Jan 17 1832   Sat Jan 17, 2019   1005 Nephro dialysis fellow paged      1104 Nephro dialysis fellow/NP repaged      1116 Repeat BP 94/64, pt given ice chips per request while waiting      1117 Potassium,Plasma(!): 4.9   1121 Creatinine(!): 13.98   1121 Hemoglobin(!): 12.2   1121 Platelets: 321   1121 Hematocrit: 42   1304 IR paged      1304 IR to evaluate      1353 IR repairing arterial port on HD cath      1405 Nephrology is coordinating OP HD at [facility] this evening with dispo to home - if not possible at [facility], plan in dialysis at [facility] with anticipated home dispo.      53 Pt signed out to evening team. Currently at dialysis at [facility], okay for discharge upon return to ED pending ongoing clinical stability.        Jossie Ng, MD      Resident Attestation:    Patient seen by me on 01/17/2019.    I saw and evaluated the patient. I agree with the resident's/fellow's findings and plan of care as documented above.  Author:  Rande Lawman, MD         Jossie Ng, MD  Resident  01/17/19 Allegra Lai       Rande Lawman, MD  01/18/19 [phone]

## 2019-01-17 NOTE — ED Notes (Signed)
ED RN INTERN ATTESTATION       I [name], RN (RN) reviewed the following charting information by the RN intern: [name]     Nursing Assessments  Medications  Plan of Care  Teaching   Notes    In the chart of Shane Pearson (58 y.o. male) and attest to the charting being accurate.

## 2019-01-17 NOTE — Progress Notes (Addendum)
Report Given To  01/17/19    Accessed left HD catheter without issues. Hypotension during treatment. Nephrology notified. 10 Mg Midodrine given.     Completed 1 hour and 55 minutes. Off early due to hypotension    Removed 2.2 Liters      Descriptive Sentence / Reason for Admission   Duration of Treatment: 638 Minutes  Complications: Hypotension        Active Issues / Relevant Events   Review of medications administered  such as routine meds, antibiotics, and heparin: Midodrine Heparin dwells  Vital Signs: View flow sheets        To Do List  Medications still needing administration: None HD  Dressing change due: 01/19/19      Anticipatory Guidance / Discharge Planning  Date/Time of Next Treatment: TBD By Nephrology    Dan Europe, RN

## 2019-01-17 NOTE — Discharge Instructions (Addendum)
You were evaluated in the Emergency Department for a broken HD catheter. The Interventional Radiology team was able to repair it. The Nephrology team was able to coordinate your dialysis. Your blood pressures were low, but they were stable.    If you develop palpitations, worsening lightheadedness, loss of consciousness, trouble breathing, or if you have any other concerns, return to the Emergency Department to be re-evaluated right away.

## 2019-01-17 NOTE — Provider Consult (Signed)
General Nephrology Consult Note:   LOS: 0 days       Subjective/HPI     Shane Pearson is a 58 y.o. male with PMHx significant for ESRD on HD (per TTS schedule at [facility]), hypotension (on midodrine and fludrocortisone with SBP chronically to the 70s during HD sessions) who presented to the ED from his outpatient HD unit due to inability to be safely dialyzed due to damage to the arterial port of his HDTC. He last had his [place] exchanged in January 2020 (and required SVC venoplasty c/b arrhythmia, hypoxia, hypotension with the recommendation per IR that future line exchanges should be performed at [facility] possibly under anesthesia). The nephrology service was consulted for addressing the patient's dialysis needs. The patient denies dyspnea, nausea, or peripheral edema and states that he recently has been limiting his fluid intake. He requests that if his line were to be exchanged he would want a catheter of the same size and length to be placed.      Review of Systems:  Constitutional:  no fatigue, no fever, no chills  Eye:  no double vision, no visual disturbances  Ear/Nose/Mouth/Throat:  no nasal congestion, no sore throat  Respiratory:  no shortness of breath, no cough, no sputum production  Cardiovascular:  no chest pain, no palpitations, no peripheral edema  Gastrointestinal:  no nausea, no vomiting, no diarrhea, no constipation, no abdominal pain  Genitourinary:  no dysuria, no hematuria, no change in urine stream  Musculoskeletal:  no joint pain, no muscle pain  Integumentary:  no rash, no breakdown, no skin lesion  Neurologic: no confusion, no numbness, no tingling      Medical History:    Past Medical History:   Diagnosis Date    Arthritis     CHF (congestive heart failure)     Colon polyp     Dialysis patient     ESRD (end stage renal disease)     GERD (gastroesophageal reflux disease)     Hypercholesterolemia     Hypotension     Ischemic colitis     Thyroid disease     hypo       Past  Surgical History:   Procedure Laterality Date    Right inguinal hernia repair         Family History   Problem Relation Age of Onset    Diabetes Mother     Asthma Mother     Coronary artery disease Father        Social History     Socioeconomic History    Marital status: Married     Spouse name: Not on file    Number of children: Not on file    Years of education: Not on file    Highest education level: Not on file   Tobacco Use    Smoking status: Former Smoker     Packs/day: 0.30     Years: 15.00     Pack years: 4.50     Types: Cigarettes     Quit date: 08/02/1992     Years since quitting: 26.4    Smokeless tobacco: Never Used   Substance and Sexual Activity    Alcohol use: No    Drug use: No    Sexual activity: Not on file   Other Topics Concern    Not on file   Social History Narrative    Not on file         Allergies:   Allergies  Allergen Reactions    Penicillins Swelling     Face    Coconut Flavor Nausea And Vomiting    Amoxicillin Rash    Baclofen Other (See Comments)     confusion    No Known Latex Allergy          Prior to Admission Medications:  (Not in a hospital admission)        Objective     Physical Exam  Current Vitals Vitals Range (24 Hours)   BP 95/68 (BP Location: Left arm)    Pulse 72    Temp 36.9 C (98.4 F)    Resp 16    Ht 1.651 m (5\' 5" )    Wt 88 kg (194 lb)    SpO2 98%    BMI 32.28 kg/m  BP: (82-95)/(43-68)   Temp:  [36.9 C (98.4 F)]   Heart Rate:  [72]   Resp:  [16]   SpO2:  [98 %]   Height:  [165.1 cm (5\' 5" )]   Weight:  [88 kg (194 lb)]      I/Os:  No intake/output data recorded.    Weight:  Last 4 Weights    01/17/19 0924   Weight: 88 kg (194 lb)         General: NAD, pleasant, cooperative  Head: normocephalic, atraumatic  Eyes: EOMI, sclera are anicteric  ENT: hearing intact to spoken voice, mucus membranes are moist  Cardiovascular: +S1 & S2 sounds, regular rate and rhythm with no murmurs/rubs/gallops, there is no LE edema  Pulmonary: lungs are grossly CTA  anteriorly  Gastrointestinal: abdomen is soft, non-tender, non-distended  Neurological: awake, alert  Skin: warm and dry, without rash to visible skin, with left-sided HDTC in place      Recent Labs:   Recent Labs   Lab 01/17/19  1038   Creatinine 13.98*   Calcium 9.0   Phosphorus 3.0   Magnesium 1.6     Recent Labs   Lab 01/17/19  1038   WBC 8.5   Hemoglobin 12.2*   Hematocrit 42   Platelets 321     No results for input(s): INR, PTT in the last 168 hours.    No components found with this basename: APTT    No results for input(s): UAPP, UCOL, UAGLU, KETONESU, USG, UBLD, UAPH, UPRO, UNITR, ULEU, URBC, UWBC, UMUC, [place], [place], [place], BLOODUA, PHUA, PROTEIN, NITRITEUA, LEUKESTERASE, GLUCOSESIE, KETONESIEM, PUSG, BLOODSIEMEN, [place], PROTEINUA, PUNIT, LEUKOCYTESIE in the last 8760 hours.               No results for input(s): [place]ardee Postin, [place], [place], [place], [place], UOSMO in the last 168 hours.       Recent Imaging:   No results found.       Current Inpatient Medications:  Scheduled Meds:  Continuous Infusions:  PRN Meds:.sodium chloride, dextrose      Assessment     Shane Pearson is a 58 y.o. male with PMHx significant for ESRD on HD per TTS schedule at [facility], chronic hypotension who presented from his outpatient HD unit due to inability to be safely dialyzed due to damage to the arterial port of his HDTC. Nephrology was consulted for addressing the patient's dialysis needs.      Plan     ESRD on HD per TTS schedule at [facility]  - will plan for next HD session following HDTC exchange with IR with the following parameters:  F180 Na 138 K 2 Ca 2.5 HCO3 35 Qb: 400  mL/min Qd: 1.5X Qb UF 1-3 L as hemodynamics tolerate   - Nephrovite daily  - follow chemistries  - dose adjust medications to CrCL <10    CKD-MBD  - calcium acceptable  - phosphate acceptable  - will continue to follow    Anemia  - Hgb is acceptable and above goal  - will defer ESA administration at this time  - follow  CBC    Lytes  - mild hyperkalemia with plasma K at 4.9  - would start patiromer 16.8 g daily pending HDTC exchange and can provide temporizing measures as needed  - follow chemistries    Acid base  - stable  - follow chemistries    Volume/blood pressure  - blood pressures are decreased  - would continue with home midodrine and fludrocortisone    Access  - for exchange of HDTC with IR      We will continue to follow along with you. Please page with any questions or concerns.    Attending addendum to follow.      Cletus Gash, DO  Nephrology Fellow, Pager: [phone]      Electronically Signed on 01/17/2019 at 12:58 PM

## 2019-01-17 NOTE — ED Provider Progress Notes (Signed)
ED Provider Progress Note       Patient left without being seen by provider after dialysis. Patient otherwise stable and plan was to discharge after dialysis.      Tanna Savoy, MD, 01/17/2019, 7:08 PM     Guilford Shi, MD  Resident  01/17/19 [phone]

## 2019-01-17 NOTE — ED Notes (Signed)
Received report from,Ehren Therapist, sports. Assuming pt care, pt currently at dialysis. Will continue to monitor and treat per MD orders.

## 2019-01-17 NOTE — ED Notes (Addendum)
Patient refused to wait for paperwork. Eloped with PIV in R wrist. Provider and charge nurse notified. Writer searched for patient around unit and outside. Patient stable and ambulatory, dressed appropriately, and access to residence.

## 2019-01-17 NOTE — ED Triage Notes (Signed)
Pt at dialysis when his HD cath cracked. Pt unable to get dialysis this AM. Last full tx on Thursday. SBP in 80's at triage. Pt states that he "runs low". EKG in triage.       Triage Note   Lajoyce Corners, RN

## 2019-01-17 NOTE — First Provider Contact (Signed)
ED First Provider Contact Note    Initial provider evaluation performed by   ED First Provider Contact     Date/Time Event User Comments    01/17/19 [phone] ED First Provider Contact Nahuel Wilbert Initial Face to Face Provider Contact        58 year old male with a hx of ESRD on dialysis (Tues/Th/Sat) - who presents for dialysis cath problem.  Was at dialysis this AM and his catheter reportedly cracked.  Was unable to do dialysis today and sent to the ED.  Last treatment was on Thursday and full.  He has no acute complaints.     Vital signs reviewed.    Assessment: Dialysis cath problem, missed dialysis     Orders placed:  EKG and LABS     Patient requires further evaluation.     [place], PA, 01/17/2019, 9:27 AM     Milana Na, PA  01/17/19 [phone]

## 2019-01-18 LAB — HEPATITIS B PROF

## 2019-01-19 LAB — EKG 12-LEAD
P: 61 deg
PR: 154 ms
QRS: 12 deg
QRSD: 95 ms
QT: 397 ms
QTc: 416 ms
Rate: 66 {beats}/min
T: 91 deg

## 2019-02-09 ENCOUNTER — Encounter: Payer: Medicare Other | Admitting: Nephrology

## 2019-02-09 DIAGNOSIS — N186 End stage renal disease: Secondary | ICD-10-CM

## 2019-02-09 DIAGNOSIS — Z992 Dependence on renal dialysis: Secondary | ICD-10-CM

## 2019-03-12 ENCOUNTER — Encounter: Payer: Medicare Other | Admitting: Nephrology

## 2019-03-12 DIAGNOSIS — N186 End stage renal disease: Secondary | ICD-10-CM

## 2019-03-12 DIAGNOSIS — Z992 Dependence on renal dialysis: Secondary | ICD-10-CM

## 2019-04-12 ENCOUNTER — Encounter: Payer: Medicare Other | Admitting: Nephrology

## 2019-04-12 DIAGNOSIS — Z992 Dependence on renal dialysis: Secondary | ICD-10-CM

## 2019-04-12 DIAGNOSIS — N186 End stage renal disease: Secondary | ICD-10-CM

## 2019-05-10 ENCOUNTER — Encounter: Payer: Medicare Other | Admitting: Nephrology

## 2019-05-10 DIAGNOSIS — Z992 Dependence on renal dialysis: Secondary | ICD-10-CM

## 2019-05-10 DIAGNOSIS — N181 Chronic kidney disease, stage 1: Secondary | ICD-10-CM

## 2019-05-18 ENCOUNTER — Encounter: Payer: Self-pay | Admitting: Transplant

## 2019-06-10 ENCOUNTER — Encounter: Payer: Medicare Other | Admitting: Nephrology

## 2019-06-10 DIAGNOSIS — Z992 Dependence on renal dialysis: Secondary | ICD-10-CM

## 2019-06-10 DIAGNOSIS — N186 End stage renal disease: Secondary | ICD-10-CM

## 2019-07-07 ENCOUNTER — Other Ambulatory Visit: Payer: Self-pay | Admitting: Transplant

## 2019-07-07 ENCOUNTER — Ambulatory Visit
Admission: RE | Admit: 2019-07-07 | Discharge: 2019-07-07 | Disposition: A | Discharge status: Home / Self Care | Payer: Medicare Other | Source: Ambulatory Visit | Attending: Radiology | Admitting: Radiology

## 2019-07-07 DIAGNOSIS — T8241XA Breakdown (mechanical) of vascular dialysis catheter, initial encounter: Secondary | ICD-10-CM

## 2019-07-07 DIAGNOSIS — N186 End stage renal disease: Secondary | ICD-10-CM | POA: Insufficient documentation

## 2019-07-07 DIAGNOSIS — Z992 Dependence on renal dialysis: Secondary | ICD-10-CM | POA: Insufficient documentation

## 2019-07-07 MED ORDER — HEPARIN SODIUM (PORCINE) 1000 UNIT/ML IJ SOLN *WRAPPED*
Freq: Once | Status: AC | PRN
Start: 2019-07-07 — End: 2019-07-07
  Administered 2019-07-07: 2.1 mL via INTRAVENOUS

## 2019-07-07 NOTE — Discharge Instructions (Signed)
Shane Pearson  Apr 12, 1960  [id number]    07/07/2019  3:19 PM    Physician performing test/procedure: Dr. Lyman Bishop   And Reece Agar RT                    Your test or procedure was: catheter repair    Patient received medication? No    Diet:    Resume your previous diet.    Activity:    Resume regular activities.    Dressing:   as prescribed    Medications:   Resume previous medications. If you are taking warfin (Coumadin), check with your doctor as to when to resume taking this medication.    Call your physician IF:   You notice signs of infection at procedure site: redness, foul smelling drainage, increased swelling, fever, chills, bleeding, or severe pain at procedure site.      Please call [place] [facility] for questions or concerns regarding your procedure.  Monday-Friday from 8am-5pm call [phone]. After hours and weekends you may call [phone] to be connected to our Answering Service.  The Emergency Department is open 24 hours a day if emergency treatment is required.   If your procedure involved collection and analysis of body fluids or tissue, you may see the results in MyChart. If you have questions about the results, please contact the health care provider who requested that you have this procedure done.

## 2019-07-10 ENCOUNTER — Encounter: Payer: Medicare Other | Admitting: Nephrology

## 2019-07-10 DIAGNOSIS — N186 End stage renal disease: Secondary | ICD-10-CM

## 2019-07-10 DIAGNOSIS — Z992 Dependence on renal dialysis: Secondary | ICD-10-CM

## 2019-08-10 ENCOUNTER — Encounter: Payer: Medicare Other | Admitting: Nephrology

## 2019-08-10 DIAGNOSIS — Z992 Dependence on renal dialysis: Secondary | ICD-10-CM

## 2019-08-10 DIAGNOSIS — N186 End stage renal disease: Secondary | ICD-10-CM

## 2019-09-09 ENCOUNTER — Encounter: Payer: Medicare Other | Admitting: Nephrology

## 2019-09-09 DIAGNOSIS — N186 End stage renal disease: Secondary | ICD-10-CM

## 2019-09-09 DIAGNOSIS — Z992 Dependence on renal dialysis: Secondary | ICD-10-CM

## 2019-10-10 ENCOUNTER — Encounter: Payer: Medicare Other | Admitting: Nephrology

## 2019-10-10 DIAGNOSIS — N186 End stage renal disease: Secondary | ICD-10-CM

## 2019-10-10 DIAGNOSIS — Z992 Dependence on renal dialysis: Secondary | ICD-10-CM

## 2019-11-10 ENCOUNTER — Encounter: Payer: Medicare Other | Admitting: Nephrology

## 2019-11-10 DIAGNOSIS — N186 End stage renal disease: Secondary | ICD-10-CM

## 2019-11-10 DIAGNOSIS — Z992 Dependence on renal dialysis: Secondary | ICD-10-CM

## 2019-12-10 ENCOUNTER — Encounter: Payer: Medicare Other | Admitting: Nephrology

## 2019-12-10 DIAGNOSIS — Z992 Dependence on renal dialysis: Secondary | ICD-10-CM

## 2019-12-10 DIAGNOSIS — N186 End stage renal disease: Secondary | ICD-10-CM

## 2019-12-18 LAB — UNMAPPED LAB RESULTS
Hematocrit (HT): 42 % (ref 40–52)
Hemoglobin (HGB) (HT): 13.1 g/dL (ref 13.0–17.5)
MCHC (HT): 31.1 g/dL — ABNORMAL LOW (ref 32.0–36.0)
MCV (HT): 83 fL (ref 81.0–99.0)
Mean Corpuscular Hemoglobin (MCH) (HT): 25.8 pg — ABNORMAL LOW (ref 26.0–34.0)
Platelets (HT): 269 10 3/uL (ref 140–400)
RBC (HT): 5.07 10 6/uL (ref 4.20–5.90)
RDW (HT): 17.4 % — ABNORMAL HIGH (ref 11.5–15.0)
WBC (HT): 7.7 10 3/uL (ref 4.0–10.8)

## 2020-01-10 ENCOUNTER — Encounter: Payer: Medicare Other | Admitting: Nephrology

## 2020-01-10 DIAGNOSIS — Z992 Dependence on renal dialysis: Secondary | ICD-10-CM

## 2020-01-10 DIAGNOSIS — N186 End stage renal disease: Secondary | ICD-10-CM

## 2020-02-09 ENCOUNTER — Encounter: Payer: Medicare Other | Admitting: Nephrology

## 2020-02-09 DIAGNOSIS — Z992 Dependence on renal dialysis: Secondary | ICD-10-CM

## 2020-02-09 DIAGNOSIS — N186 End stage renal disease: Secondary | ICD-10-CM

## 2020-03-11 ENCOUNTER — Encounter: Payer: Medicare Other | Admitting: Nephrology

## 2020-03-11 DIAGNOSIS — Z992 Dependence on renal dialysis: Secondary | ICD-10-CM

## 2020-03-11 DIAGNOSIS — N186 End stage renal disease: Secondary | ICD-10-CM

## 2020-04-11 ENCOUNTER — Encounter: Payer: Medicare Other | Admitting: Nephrology

## 2020-04-11 DIAGNOSIS — Z992 Dependence on renal dialysis: Secondary | ICD-10-CM

## 2020-04-11 DIAGNOSIS — N186 End stage renal disease: Secondary | ICD-10-CM

## 2020-05-09 ENCOUNTER — Encounter: Payer: Medicare Other | Admitting: Nephrology

## 2020-05-09 DIAGNOSIS — Z992 Dependence on renal dialysis: Secondary | ICD-10-CM

## 2020-05-09 DIAGNOSIS — N186 End stage renal disease: Secondary | ICD-10-CM

## 2020-05-18 ENCOUNTER — Other Ambulatory Visit: Payer: Self-pay | Admitting: Transplant

## 2020-05-18 ENCOUNTER — Encounter: Payer: Self-pay | Admitting: Transplant

## 2020-05-18 DIAGNOSIS — Z992 Dependence on renal dialysis: Secondary | ICD-10-CM

## 2020-05-18 DIAGNOSIS — N186 End stage renal disease: Secondary | ICD-10-CM

## 2020-05-18 DIAGNOSIS — Z01818 Encounter for other preprocedural examination: Secondary | ICD-10-CM

## 2020-05-21 ENCOUNTER — Ambulatory Visit: Admit: 2020-05-21 | Discharge: 2020-05-21 | Disposition: A | Discharge status: Home / Self Care | Payer: Medicare Other

## 2020-05-21 ENCOUNTER — Ambulatory Visit: Payer: Medicare Other

## 2020-05-21 ENCOUNTER — Ambulatory Visit
Admission: AD | Admit: 2020-05-21 | Discharge: 2020-05-21 | Disposition: A | Discharge status: Home / Self Care | Payer: Medicare Other | Source: Ambulatory Visit | Attending: Internal Medicine | Admitting: Internal Medicine

## 2020-05-21 DIAGNOSIS — I213 ST elevation (STEMI) myocardial infarction of unspecified site: Secondary | ICD-10-CM

## 2020-05-21 DIAGNOSIS — R42 Dizziness and giddiness: Secondary | ICD-10-CM

## 2020-05-21 DIAGNOSIS — M76891 Other specified enthesopathies of right lower limb, excluding foot: Secondary | ICD-10-CM

## 2020-05-21 DIAGNOSIS — M19072 Primary osteoarthritis, left ankle and foot: Secondary | ICD-10-CM

## 2020-05-21 DIAGNOSIS — S8002XA Contusion of left knee, initial encounter: Secondary | ICD-10-CM

## 2020-05-21 DIAGNOSIS — M7989 Other specified soft tissue disorders: Secondary | ICD-10-CM

## 2020-05-21 DIAGNOSIS — M3119 Other thrombotic microangiopathy: Secondary | ICD-10-CM

## 2020-05-21 DIAGNOSIS — S9032XA Contusion of left foot, initial encounter: Secondary | ICD-10-CM

## 2020-05-21 DIAGNOSIS — W19XXXA Unspecified fall, initial encounter: Secondary | ICD-10-CM | POA: Insufficient documentation

## 2020-05-21 DIAGNOSIS — Z789 Other specified health status: Secondary | ICD-10-CM | POA: Insufficient documentation

## 2020-05-21 NOTE — UC Provider Note (Signed)
History     Chief Complaint   Patient presents with    Knee Injury    Foot Injury    Syncope    Fall     Shane Pearson is a 60 y.o. male with PMHx significant for arthritis, CHF, ESRD on HD, hypotension, GERD, and hypothyroid disease presents with after a fall. Patient reports last he fell twisting his knee, calf and foot landing with the left side of his lower body hitting the ground. Describes he had "episodes of passing out." Reports he was sitting down for an extended period of time, when he stood up quickly to walk to the kitchen, then when he bent down to put something in the fridge, when he felt dizzy, then fell to the floor. Ongoing pain in his lower leg today, unable to bear weight.   Has taken Tylenol for pain today.  Did not hit head head, headache, no LOC or neck pain.     Patient reports he has chronic dizziness that has been ongoing for the last 2 years.  Episodes occur approximately 5 times weekly.  Reports when he feels dizzy, he will quickly sit down to prevent falling, admits he was unable to sit down on this occasion as he was bending forward.  Reports he had an extensive cardiac evaluation and hospitalization 2 years ago.  Known history of hypertension, states his " blood pressure is typically in the 80s"  has been followed outpatient by cardiologist and PCP for ongoing symptoms.     Patient is on HD, last treatment this AM.       History provided by:  Patient, medical records and spouse  Language interpreter used: No        Medical/Surgical/Family History     Past Medical History:   Diagnosis Date    Arthritis     CHF (congestive heart failure)     Colon polyp     Dialysis patient     ESRD (end stage renal disease)     GERD (gastroesophageal reflux disease)     Hypercholesterolemia     Hypotension     Ischemic colitis     Thyroid disease     hypo        Patient Active Problem List   Diagnosis Code    ESRD (end stage renal disease) on dialysis N18.6, Z99.2    HLD  (hyperlipidemia) E78.5    Hypotension I95.9    Ischemic colitis K55.9            Past Surgical History:   Procedure Laterality Date    DIALYSIS FISTULA CREATION Bilateral     4 fistulas have failed- ? d/t low BP and small veins    Right inguinal hernia repair       Family History   Problem Relation Age of Onset    Diabetes Mother     Asthma Mother     Coronary artery disease Father           Social History     Tobacco Use    Smoking status: Former Smoker     Packs/day: 0.30     Years: 15.00     Pack years: 4.50     Types: Cigarettes     Quit date: 08/02/1992     Years since quitting: 27.8    Smokeless tobacco: Never Used   Substance Use Topics    Alcohol use: No    Drug use: No     Living Situation  Questions Responses    Patient lives with Spouse    Homeless     Caregiver for other family member     External Services     Employment Employed    Domestic Violence Risk                 Review of Systems   Review of Systems   Constitutional: Negative for chills and fever.   Eyes: Negative for photophobia and visual disturbance.   Musculoskeletal: Positive for arthralgias (Left knee and foot ), gait problem and joint swelling. Negative for back pain, myalgias, neck pain and neck stiffness.   Neurological: Positive for dizziness and light-headedness. Negative for seizures, speech difficulty, numbness and headaches.       Physical Exam   Triage Vitals  Triage Start: Start, (05/21/20 1214)   First Recorded BP: 99/52, Resp: 18, Temp: 36.7 C (98.1 F), Temp src: TEMPORAL Oxygen Therapy SpO2: 100 %, Oximetry Source: Rt Hand, O2 Device: None (Room air), Heart Rate: 82, (05/21/20 1221)  .  First Pain Reported  0-10 Scale: 3, Pain Location/Orientation: Knee Left;Foot Left, (05/21/20 1221)       Physical Exam  Vitals and nursing note reviewed.   Constitutional:       General: He is not in acute distress.     Appearance: He is well-developed. He is not ill-appearing or toxic-appearing.   HENT:      Head:  Normocephalic.   Eyes:      Extraocular Movements: Extraocular movements intact.      Conjunctiva/sclera: Conjunctivae normal.      Pupils: Pupils are equal, round, and reactive to light.   Pulmonary:      Effort: Pulmonary effort is normal.   Musculoskeletal:      Left hip: Normal.      Left upper leg: Normal.      Left knee: Bony tenderness (Proximal fibula and tibia just distal to the patella tendon.  No pain with varus or valgus stretch) present. No swelling, deformity, effusion, erythema, ecchymosis, lacerations or crepitus. Normal range of motion. No tenderness. Normal alignment. Normal pulse.      Left lower leg: Bony tenderness present. No swelling, deformity, lacerations or tenderness. No edema.      Left ankle: Normal.      Left foot: Normal range of motion and normal capillary refill. Swelling (Dorsal), tenderness and bony tenderness (Midfoot metatarsals 1 through 4.  Skin intact, no ecchymosis) present. No deformity, bunion, Charcot foot, foot drop, prominent metatarsal heads, laceration or crepitus. Normal pulse.   Skin:     General: Skin is warm and dry.   Neurological:      Mental Status: He is alert and oriented to person, place, and time.      Comments: Facial expression and sensation is symmetrical, sensation intact to light touch. Hearing intact to voice, palate elevates symmetrically, shoulder shrug is symmetric, tongue is midline.      Psychiatric:         Mood and Affect: Mood normal.         Behavior: Behavior normal.          Medical Decision Making     ED Course as of 05/21/20 1712   Sat May 21, 2020   1359 LEFT FOOT X-RAYS     CLINICAL INFORMATION: TTP forefoot MC 1-4 s/p fall twisting foot yesterday.     COMPARISON: None.     PROCEDURE: Three projections of the left foot were obtained.  IMPRESSION/FINDINGS:    No fracture or dislocation.   Mild degenerative disease of the forefoot.   No ankle joint effusion.   Soft tissue swelling over the forefoot.    1409 LEFT KNEE X-RAYS      CLINICAL INFORMATION: TTP proximal tib/fib s/p fall twisting knee yesterday.     COMPARISON: None.     PROCEDURE: Three projections of the left knee were obtained.     IMPRESSION/FINDINGS:      No fracture or dislocation.     No knee joint effusion.   Superior and inferior enthesophytes of the patella.        Medical Decision Making  Assessment:    Patient is a 60 year old male presents urgent care with left foot and knee pain after he fell yesterday twisting his lower extremity and falling to the floor.   Reassuring patient neurologically intact with lack of LOC, head, or headache. Trend of VS SBP 90's at baseline    Differential diagnosis:    Left knee contusion  Proximal tibia/tibia fracture  Ligamentous sprain  Lisfranc dislocation  Metacarpal fracture  Metatarsalgia  Tendinitis      Plan and Results:    -EKG for dizziness     x-ray ordered to evaluate for bony abnormality  Foot and knee x-ray personally reviewed by me which is negative for acute fracture dislocation.  See final radiologist or patient above  -Felt symptoms likely related to contusion versus ligamentous sprain.  -Ace wrap provided for comfort, patient was is to follow-up with his PCP outpatient.  Expressed He wishes to seek care outpatient with a neurologist regarding ongoing dizziness.  Will discuss with his PCP.  Encounter orders    Orders Placed This Encounter      * Knee LEFT standard AP, Lateral, Patellar views      * Foot LEFT standard AP, Lateral, Obliqu      Apply ace wrap      Apply ace wrap      EKG 12 lead (initial)        EKG Interpretation:    EKG sinus rhythm rate 80, no ischemic changes from previous EKG 01/17/2019, noted flattened T waves V2 only.  Normal sinus rhythm and No ischemic changes      Independent Review of: existing labs/imaging and chart/prior records    Diagnosis and Disposition:    -Patient comfortable with discharge home with his spouse.  Has a cane to use at home for assisted ambulation as needed.  -Close  follow-up with PCP within the next 2-3 days or the ED for new or worsening symptoms    Return precautions discussed and provided on AVS.    Discharge Medications  Current Discharge Medication List      Follow-up   Follow-up Information     Schedule an appointment as soon as possible for a visit  with Patrici Ranks, MD.    Specialty: Internal Medicine  Why: to further discuss ongoing dizziness  Contact information:  [address]  [place] [address]  [phone]                     Patient Instructions   .       Do not overdo it, as you want to avoid any re-injury.  Use cane (you have this at home) for support with walking   Rest  Rest is required to allow your body to heal. Generally, you can resume  your routine activities when you are comfortable and have been given   permission by your health care provider.  Ice  Icing your injury helps to keep the swelling down and it reduces pain. Do   not apply ice directly to your skin.  Put ice in a plastic bag.  Place a towel between your skin and the bag.  Leave the ice on for 20 minutes, 2-3 times per day.  Do this for as long as you are directed by your health care provider.  Compression  Compression helps to keep swelling down, gives support, and helps with   discomfort. Compression may be done with an elastic bandage.  Elevation  Elevation helps to reduce swelling and it decreases pain. If possible,   your injured area should be placed at or above the level of your heart or   the center of your chest.    If your symptoms suddenly worsen, with any evolving or worsening   dizziness, visual changes, headache, nausea/vomiting, passing out, loss   consciousness, neck pain please present to the emergency department for   further evaluation      Final Diagnosis  Final diagnoses:   [S80.02XA] Contusion of left knee, initial encounter (Primary)         Noreene Filbert, NP              Noreene Filbert, NP  05/21/20 1712

## 2020-05-21 NOTE — Discharge Instructions (Addendum)
Do not overdo it, as you want to avoid any re-injury.  Use cane (you have this at home) for support with walking   Rest  Rest is required to allow your body to heal. Generally, you can resume your routine activities when you are comfortable and have been given permission by your health care provider.  Ice  Icing your injury helps to keep the swelling down and it reduces pain. Do not apply ice directly to your skin.  Put ice in a plastic bag.  Place a towel between your skin and the bag.  Leave the ice on for 20 minutes, 2-3 times per day.  Do this for as long as you are directed by your health care provider.  Compression  Compression helps to keep swelling down, gives support, and helps with discomfort. Compression may be done with an elastic bandage.  Elevation  Elevation helps to reduce swelling and it decreases pain. If possible, your injured area should be placed at or above the level of your heart or the center of your chest.    If your symptoms suddenly worsen, with any evolving or worsening dizziness, visual changes, headache, nausea/vomiting, passing out, loss consciousness, neck pain please present to the emergency department for further evaluation

## 2020-05-21 NOTE — ED Triage Notes (Addendum)
Syncopal episode at home last night in kitchen, fell presents with left knee pain, left foot pain and left calf pain. States he has low blood pressure at times and has had work-up for syncopal episodes with cardiologist for 2 years. Pain with ambulation. Pain 2/10. Denies numbness and tingling. Patient is a dialysis patient for 20 years. Hx of Heart Failure. SBP 99/52, HR 83. States his blood pressure is usually in the 80s.

## 2020-05-22 LAB — EKG 12-LEAD
P: 40 deg
PR: 147 ms
QRS: 47 deg
QRSD: 88 ms
QT: 359 ms
QTc: 415 ms
Rate: 80 {beats}/min
T: 116 deg

## 2020-06-09 ENCOUNTER — Encounter: Payer: Medicare Other | Admitting: Nephrology

## 2020-06-09 DIAGNOSIS — N186 End stage renal disease: Secondary | ICD-10-CM

## 2020-06-09 DIAGNOSIS — Z992 Dependence on renal dialysis: Secondary | ICD-10-CM

## 2020-06-29 ENCOUNTER — Telehealth: Payer: Self-pay

## 2020-06-29 DIAGNOSIS — Z01812 Encounter for preprocedural laboratory examination: Secondary | ICD-10-CM

## 2020-06-29 DIAGNOSIS — Z20822 Contact with and (suspected) exposure to covid-19: Secondary | ICD-10-CM

## 2020-06-29 NOTE — Telephone Encounter (Signed)
Pre-call complete. Pre-procedure instructions reviewed with patient and sent via MyChart message (see below for details). Patient to have COVID test done at [facility]. Patient to have labs drawn when he has dialysis done at [facility] Patient on aspirin but will hold it starting on 4/23.      Your arrival time is 7:00 am on 4/28. Anesthesia will call you the day before your procedure to give a more exact arrival time     Please get Covid tested on 4/23 (within 3-5 days of your procedure time). You may also have the test done on 4/25 at the latest.  o Location of Covid testing at [address] [place] [id number].  They are open M-F from 7:30 am to 5 pm and Sat/Sun from 8 am to 12 pm.  You do not need to make an appointment, it is a walk-in clinic.   Please mention that your labs are to be drawn at your next hemodialysis appointment.     Nothing to eat for 6 hours before your procedure.     You may have clear liquids (water, apple juice, coffee or tea with no cream) until 2 hours before your procedure.     Please take your prescribed medications with a sip of water in morning.     Please hold: Aspirin, starting 4/23     You must have someone to drive you home after your procedure.     You should have someone spend the night with you after your procedure.     Take the green elevators to the ground floor and follow the signs for imaging check-in.     If you have any questions or need to reschedule this appointment please call [phone].     If you have any issues with getting your labs done or COVID testing done, please call the Care Coordinator at [phone]

## 2020-07-02 ENCOUNTER — Other Ambulatory Visit
Admission: RE | Admit: 2020-07-02 | Discharge: 2020-07-02 | Disposition: A | Discharge status: Home / Self Care | Payer: Self-pay | Source: Ambulatory Visit

## 2020-07-02 LAB — CBC AND DIFFERENTIAL
Baso # K/uL: 0 10*3/uL (ref 0.0–0.1)
Basophil %: 0.5 %
Eos # K/uL: 0.4 10*3/uL (ref 0.0–0.5)
Eosinophil %: 5.1 %
Hematocrit: 42 % (ref 40–51)
Hemoglobin: 13 g/dL — ABNORMAL LOW (ref 13.7–17.5)
IMM Granulocytes #: 0 10*3/uL (ref 0.0–0.0)
IMM Granulocytes: 0.5 %
Lymph # K/uL: 2.1 10*3/uL (ref 1.3–3.6)
Lymphocyte %: 26.3 %
MCH: 28 pg (ref 26–32)
MCHC: 31 g/dL — ABNORMAL LOW (ref 32–37)
MCV: 91 fL (ref 79–92)
Mono # K/uL: 0.7 10*3/uL (ref 0.3–0.8)
Monocyte %: 8.4 %
Neut # K/uL: 4.7 10*3/uL (ref 1.8–5.4)
Nucl RBC # K/uL: 0 10*3/uL (ref 0.0–0.0)
Nucl RBC %: 0 /100 WBC (ref 0.0–0.2)
Platelets: 284 10*3/uL (ref 150–330)
RBC: 4.6 MIL/uL (ref 4.6–6.1)
RDW: 22.5 % — ABNORMAL HIGH (ref 11.6–14.4)
Seg Neut %: 59.2 %
WBC: 8 10*3/uL (ref 4.2–9.1)

## 2020-07-02 LAB — RBC MORPHOLOGY

## 2020-07-02 LAB — PROTIME-INR

## 2020-07-02 LAB — BASIC METABOLIC PANEL
Anion Gap: 30 — ABNORMAL HIGH (ref 7–16)
CO2: 18 mmol/L — ABNORMAL LOW (ref 20–28)
Calcium: 9 mg/dL (ref 8.6–10.2)
Chloride: 94 mmol/L — ABNORMAL LOW (ref 96–108)
Creatinine: 16.96 mg/dL — ABNORMAL HIGH (ref 0.67–1.17)
Glucose: 104 mg/dL — ABNORMAL HIGH (ref 60–99)
Lab: 69 mg/dL — ABNORMAL HIGH (ref 6–20)
Potassium: 4.5 mmol/L (ref 3.3–5.1)
Sodium: 142 mmol/L (ref 133–145)
eGFR BY CREAT: 3 * — AB

## 2020-07-04 ENCOUNTER — Ambulatory Visit: Payer: Medicare Other | Attending: Radiology

## 2020-07-04 DIAGNOSIS — Z20822 Contact with and (suspected) exposure to covid-19: Secondary | ICD-10-CM | POA: Insufficient documentation

## 2020-07-04 DIAGNOSIS — Z20828 Contact with and (suspected) exposure to other viral communicable diseases: Secondary | ICD-10-CM | POA: Insufficient documentation

## 2020-07-04 DIAGNOSIS — Z01812 Encounter for preprocedural laboratory examination: Secondary | ICD-10-CM | POA: Insufficient documentation

## 2020-07-04 LAB — RESOLUTION

## 2020-07-04 LAB — COVID-19 NAAT (PCR): COVID-19 NAAT (PCR): NEGATIVE

## 2020-07-05 ENCOUNTER — Other Ambulatory Visit
Admission: RE | Admit: 2020-07-05 | Discharge: 2020-07-05 | Disposition: A | Discharge status: Home / Self Care | Payer: Self-pay | Source: Ambulatory Visit

## 2020-07-05 LAB — PROTIME-INR
INR: 1 (ref 0.9–1.1)
Protime: 11.9 s (ref 10.0–12.9)

## 2020-07-06 ENCOUNTER — Telehealth: Payer: Self-pay

## 2020-07-06 NOTE — Telephone Encounter (Signed)
Patient notified of 8:45 arrival time for IR procedure tomorrow.

## 2020-07-07 ENCOUNTER — Ambulatory Visit
Admission: RE | Admit: 2020-07-07 | Discharge: 2020-07-07 | Disposition: A | Discharge status: Home / Self Care | Payer: Medicare Other | Source: Ambulatory Visit | Attending: Radiology | Admitting: Radiology

## 2020-07-07 ENCOUNTER — Ambulatory Visit: Payer: Medicare Other | Admitting: Anesthesiology

## 2020-07-07 DIAGNOSIS — K219 Gastro-esophageal reflux disease without esophagitis: Secondary | ICD-10-CM | POA: Insufficient documentation

## 2020-07-07 DIAGNOSIS — Y999 Unspecified external cause status: Secondary | ICD-10-CM | POA: Insufficient documentation

## 2020-07-07 DIAGNOSIS — I509 Heart failure, unspecified: Secondary | ICD-10-CM | POA: Insufficient documentation

## 2020-07-07 DIAGNOSIS — N186 End stage renal disease: Secondary | ICD-10-CM | POA: Insufficient documentation

## 2020-07-07 DIAGNOSIS — T82898A Other specified complication of vascular prosthetic devices, implants and grafts, initial encounter: Secondary | ICD-10-CM | POA: Insufficient documentation

## 2020-07-07 DIAGNOSIS — Y929 Unspecified place or not applicable: Secondary | ICD-10-CM | POA: Insufficient documentation

## 2020-07-07 DIAGNOSIS — Z87891 Personal history of nicotine dependence: Secondary | ICD-10-CM | POA: Insufficient documentation

## 2020-07-07 DIAGNOSIS — Y841 Kidney dialysis as the cause of abnormal reaction of the patient, or of later complication, without mention of misadventure at the time of the procedure: Secondary | ICD-10-CM | POA: Insufficient documentation

## 2020-07-07 DIAGNOSIS — Z992 Dependence on renal dialysis: Secondary | ICD-10-CM | POA: Insufficient documentation

## 2020-07-07 DIAGNOSIS — Y939 Activity, unspecified: Secondary | ICD-10-CM | POA: Insufficient documentation

## 2020-07-07 LAB — POTASSIUM: Potassium: 5 mmol/L (ref 3.3–5.1)

## 2020-07-07 MED ORDER — SODIUM CHLORIDE 0.9 % FLUSH FOR PUMPS *I*
0.0000 mL/h | INTRAVENOUS | Status: DC | PRN
Start: 2020-07-07 — End: 2020-07-08

## 2020-07-07 MED ORDER — PROPOFOL INFUSION 10 MG/ML *I*
INTRAVENOUS | Status: DC | PRN
Start: 2020-07-07 — End: 2020-07-07
  Administered 2020-07-07: 20 ug/kg/min via INTRAVENOUS
  Administered 2020-07-07: 40 ug/kg/min via INTRAVENOUS
  Administered 2020-07-07: 30 ug/kg/min via INTRAVENOUS

## 2020-07-07 MED ORDER — PHENYLEPHRINE 80 MG (0.32MG/ML) IN NS 250 ML *I*
INTRAVENOUS | Status: DC | PRN
Start: 2020-07-07 — End: 2020-07-07
  Administered 2020-07-07: 80 ug/min via INTRAVENOUS
  Administered 2020-07-07: 40 ug/min via INTRAVENOUS
  Administered 2020-07-07: 30 ug/min via INTRAVENOUS
  Administered 2020-07-07: 50 ug/min via INTRAVENOUS
  Administered 2020-07-07: 75 ug/min via INTRAVENOUS
  Administered 2020-07-07: 50 ug/min via INTRAVENOUS

## 2020-07-07 MED ORDER — KETAMINE HCL 10 MG/ML IJ/IV SOLN *WRAPPED*
Status: DC | PRN
Start: 2020-07-07 — End: 2020-07-07
  Administered 2020-07-07: 20 mg via INTRAVENOUS
  Administered 2020-07-07 (×2): 10 mg via INTRAVENOUS

## 2020-07-07 MED ORDER — SODIUM CHLORIDE 0.9 % IV SOLN WRAPPED *I*
30.0000 mL/h | Status: DC
Start: 2020-07-07 — End: 2020-07-07

## 2020-07-07 MED ORDER — HEPARIN SODIUM (PORCINE) 1000 UNIT/ML IJ SOLN *WRAPPED*
Status: AC
Start: 2020-07-07 — End: 2020-07-07
  Filled 2020-07-07: qty 10

## 2020-07-07 MED ORDER — LIDOCAINE HCL 1 % IJ SOLN *I*
INTRAMUSCULAR | Status: AC
Start: 2020-07-07 — End: 2020-07-07
  Filled 2020-07-07: qty 20

## 2020-07-07 MED ORDER — DEXTROSE 5 % FLUSH FOR PUMPS *I*
0.0000 mL/h | INTRAVENOUS | Status: DC | PRN
Start: 2020-07-07 — End: 2020-07-08

## 2020-07-07 MED ORDER — FENTANYL CITRATE 50 MCG/ML IJ SOLN *WRAPPED*
INTRAMUSCULAR | Status: AC
Start: 2020-07-07 — End: 2020-07-07
  Filled 2020-07-07: qty 2

## 2020-07-07 MED ORDER — PHENYLEPHRINE 80 MG (0.32MG/ML) IN NS 250 ML *I*
INTRAVENOUS | Status: AC
Start: 2020-07-07 — End: 2020-07-07
  Filled 2020-07-07: qty 250

## 2020-07-07 MED ORDER — HEPARIN SODIUM (PORCINE) 1000 UNIT/ML IJ SOLN *WRAPPED*
Status: AC | PRN
Start: 2020-07-07 — End: 2020-07-07
  Administered 2020-07-07: 1.6 mL via INTRAVENOUS

## 2020-07-07 MED ORDER — PHENYLEPHRINE 100 MCG/ML IN NS 10 ML *WRAPPED*
INTRAMUSCULAR | Status: DC | PRN
Start: 2020-07-07 — End: 2020-07-07
  Administered 2020-07-07: 50 ug via INTRAVENOUS

## 2020-07-07 MED ORDER — MIDAZOLAM HCL 1 MG/ML IJ SOLN *I* WRAPPED
INTRAMUSCULAR | Status: AC
Start: 2020-07-07 — End: 2020-07-07
  Filled 2020-07-07: qty 2

## 2020-07-07 MED ORDER — KETAMINE HCL 10 MG/ML IJ/IV SOLN *WRAPPED*
Status: AC
Start: 2020-07-07 — End: 2020-07-07
  Filled 2020-07-07: qty 20

## 2020-07-07 NOTE — Anesthesia Postprocedure Evaluation (Signed)
Anesthesia Post-Op Note    Patient: Shane Pearson    Procedure(s) Performed:  Procedure Summary  Date:  07/07/2020 Anesthesia Start: 07/07/2020 10:56 AM Anesthesia Stop: 07/07/2020 12:49 PM Room / Location:  * No operating room entered * / [facility] IR/ANG   * No procedures listed * Diagnosis:  * No pre-op diagnosis entered * * No surgeons listed * Responsible Anesthesia Provider:  Celestia Khat, MD         Recovery Vitals  BP: (!) 92/40 (07/07/2020  1:00 PM)  Heart Rate: 68 (07/07/2020  1:00 PM)  Heart Rate (via Pulse Ox): 65 (07/07/2020  1:00 PM)  Resp: 16 (07/07/2020  1:00 PM)  Temp: 36 C (96.8 F) (07/07/2020 12:45 PM)  SpO2: 98 % (07/07/2020  1:00 PM)   0-10 Scale: 0 (07/07/2020  1:00 PM)    Anesthesia type:  MAC  Complications Noted During Procedure or in PACU:  None   Comment:    Patient Location:  PACU  Level of Consciousness:    Recovered to baseline  Patient Participation:     Able to participate  Temperature Status:    Normothermic  Oxygen Saturation:    Within patient's normal range  Cardiac Status:   Within patient's normal range  Fluid Status:    Stable  Airway Patency:     Yes  Pulmonary Status:    Baseline  Pain Management:    Adequate analgesia  Nausea and Vomiting:  None    Post Op Assessment:    Tolerated procedure well and no evidence of recall  Responsible Anesthesia Provider Attestation:  All indicated post anesthesia care provided       -

## 2020-07-07 NOTE — Anesthesia Case Conclusion (Signed)
CASE CONCLUSION  Emergence  Criteria Used for Airway Removal:  Acceptable O2 saturation  Assessment:  Routine  Transport  Directly to: PACU  Airway:  Nasal cannula  Oxygen Delivery:  2 lpm  Position:  Recumbent  Patient Condition on Handoff  Level of Consciousness:  Alert/talking/calm  Patient Condition:  Stable  Handoff Report to:  RN

## 2020-07-07 NOTE — Invasive Procedure Plan of Care (Signed)
[facility]  OR SURGICAL PROCEDURE                            Patient Name: Shane Pearson  [facility] [id number] MR                                                              DOB: [birth year]         Please read this form or have someone read it to you.   It's important to understand all parts of this form. If something isn't clear, ask [place] to explain.   When you sign it, that means you understand the form and give [place] permission to do this surgery or procedure.     I agree for Chapman Moss, MD , and Vascular and Interventional Radiology Attendings, Fellows, Residents and Advanced Practice Providers   along with any assistants* they may choose, to treat the following condition(s): Venous access   By doing this surgery or procedure on me: A venous catheter will be exchanged   This is also known as: Tunneled central line exchange   Laterality: Left     *if you'd like a list of the assistants, please ask. We can give that to you.    1. The care provider has explained my condition to me. They have told me how the procedure can help me. They have told me about other ways of treating my condition. I understand the care provider cannot guarantee the result of the procedure. If I don't have this procedure, my other choices are: Not to perform the procedure    2. The care provider has told me the risks (problems that can happen) of the procedure. I understand there may be unwanted results. The risks that are related to this procedure include: Allergic reaction to dye, bleeding, shock, infection, collapse of lung, possible need for chest tube placement, catheter malposition, thrombus, mechanical phlebitis, and in very rare circumstances death.    3. I understand that during the procedure, my care provider may find a condition that we didn't know about before the treatment started. Therefore, I agree that my care provider can perform any other treatment which  they think is necessary and available.    4. I give permission to the hospital and/or its departments to examine and keep tissue, blood, body parts, fluids or materials removed from my body during the procedure(s) to aid in diagnosis and treatment, after which they may be used for scientific research or teaching by appropriate persons. If these materials are used for science or teaching, my identity will be protected. I will no longer own or have any rights to these materials regardless of how they may be used.    5. My care provider might want a representative from a [employer] to be there during my procedure. I understand that person works for:          The ways they might help my care provider during my procedure include:            6. Here are my decisions about receiving blood, blood products, or tissues. I understand my decisions cover the  time before, during and after my procedure, my treatment, and my time in the hospital. After my procedure, if my condition changes a lot, my care provider will talk with me again about receiving blood or blood products. At that time, my care provider might need me to review and sign another consent form, about getting or refusing blood.    I understand that the blood is from the community blood supply. Volunteers donated the blood, the volunteers were screened for health problems. The blood was examined with very sensitive and accurate tests to look for hepatitis, HIV/AIDS, and other diseases. Before I receive blood, it is tested again to make sure it is the correct type.    My chances of getting a sickness from blood products are small. But no transfusion is 100% safe. I understand that my care provider feels the good I will receive from the blood is greater than the chances of something going wrong. My care provider has answered my questions about blood products.      My decision  about blood or  blood products           My decision   about tissue  Implants               I understand this  form.    My care provider  or his/her  assistants have  explained:   What I am having done and why I need it.  What other choices I can make instead of having this done.  The benefits and possible risks (problems) to me of having this done.  The benefits and possible risks (problems) to me of receiving transplants, blood, or blood products.  There is no guarantee of the results.  The care provider may not stay with me the entire time that I am in the operating or procedure room.  My provider has explained how this may affect my procedure. My provider has answered my  questions about this.         I give my  permission for  this surgery or  procedure.            _______________________________________________                                     My signature  (or parent or other person authorized to sign for you, if you are unable to sign for  yourself or if you are under 62 years old)        ______           Date        _____        Time   Electronic Signatures will display at the bottom of the consent form.    Care provider's statement: I have discussed the planned procedure, including the possibility for transfusion of blood  products or receipt of tissue as necessary; expected benefits; the possible complications and risks; and possible alternatives  and their benefits and risks with the patients or the patient's surrogate. In my opinion, the patient or the patient's surrogate  understands the proposed procedure, its risks, benefits and alternatives.              Electronically signed by: Truett Mainland, MD  07/07/2020         Date        9:49 AM        Time

## 2020-07-07 NOTE — Progress Notes (Addendum)
[employer] [employer] Nursing Procedure Note    Shane Pearson  [id number]    Procedure: Replacement of Hemodialysis Catheter        Status: Completed    Patient tolerated procedure well   Patient masked throughout entire procedure:No airway being monitored and managed by anesthesia     Specimen Collection: no  Sponge count: N/A  Fluid Removed:N/A  Procedure Dressing Site located:left chest  Dressing Type:Dermabond and central line dressing  Tube labeled:Yes  Biopatch:Yes  Dressing status:Clean, dry and intact   Hematoma:Not evident  Medication received:see MAR for anesthesia medication administration  Cardiovascular:   Peripheral Pulses: N/A    Fistula: N/A  Neuro Assessment:Patient is at pre-procedure baseline  Implant patient information given to patient or parent/guardian:N/A    Report given [id number] recovery nurse. Name: Santiago Glad in PACU

## 2020-07-07 NOTE — Preop H&P (Signed)
OUTPATIENT  Chief Complaint: Tunneled central line exchange    History of Present Illness:  60 year old male with past medical history of ESRD status post central line placement.  He is here for central line exchange due to decreased volume through the current central line.      Past Medical History:   Diagnosis Date    Arthritis     CHF (congestive heart failure)     Colon polyp     Dialysis patient     ESRD (end stage renal disease)     GERD (gastroesophageal reflux disease)     Hypercholesterolemia     Hypotension     Ischemic colitis     Thyroid disease     hypo     Past Surgical History:   Procedure Laterality Date    DIALYSIS FISTULA CREATION Bilateral     4 fistulas have failed- ? d/t low BP and small veins    Right inguinal hernia repair       Family History   Problem Relation Age of Onset    Diabetes Mother     Asthma Mother     Coronary artery disease Father      Social History     Socioeconomic History    Marital status: Married     Spouse name: Not on file    Number of children: Not on file    Years of education: Not on file    Highest education level: Not on file   Tobacco Use    Smoking status: Former Smoker     Packs/day: 0.30     Years: 15.00     Pack years: 4.50     Types: Cigarettes     Quit date: 08/02/1992     Years since quitting: 27.9    Smokeless tobacco: Never Used   Substance and Sexual Activity    Alcohol use: No    Drug use: No    Sexual activity: Not on file   Other Topics Concern    Not on file   Social History Narrative    Not on file       Allergies:   Allergies   Allergen Reactions    Penicillins Swelling     Face    Coconut Flavor Nausea And Vomiting    Amoxicillin Rash    Baclofen Other (See Comments)     confusion    No Known Latex Allergy        Current Outpatient Medications   Medication    sevelamer (RENVELA) 800 MG tablet    acetaminophen (TYLENOL) 325 MG tablet    aspirin 81 MG EC tablet    midodrine (PROAMATINE) 10 MG tablet    fluticasone  (FLONASE) 50 MCG/ACT nasal spray    atorvastatin (LIPITOR) 10 MG tablet    fludrocortisone (FLORINEF) 0.1 mg tablet    b complex-vitamin c-folic acid (NEPHRO-VITE) tablet    calcium-vitamin D (OSCAL-500) 500-200 MG-UNIT per tablet    cinacalcet (SENSIPAR) 30 MG tablet    omeprazole (PRILOSEC) 20 MG capsule    minocycline (MINOCIN) 100 MG capsule     No current facility-administered medications for this encounter.        Review of Systems:   Review of Systems   Constitutional: Negative for fever.   Respiratory: Negative for shortness of breath.    Cardiovascular: Negative for chest pain.   Gastrointestinal: Negative for abdominal pain.   Neurological: Negative for dizziness.       Last  Nursing documented pain:  0-10 Scale: 0 (07/07/20 0950)      No data found.         Physical Exam  HENT:      Head: Atraumatic.      Mouth/Throat:      Mouth: Mucous membranes are moist.   Eyes:      Conjunctiva/sclera: Conjunctivae normal.   Cardiovascular:      Rate and Rhythm: Normal rate.   Pulmonary:      Effort: Pulmonary effort is normal.   Abdominal:      Palpations: Abdomen is soft.   Neurological:      Mental Status: He is alert.   Psychiatric:         Mood and Affect: Mood normal.         Behavior: Behavior normal.         Lab Results:   CBC       Lab results: 07/02/20  0748   WBC 8.0   Hemoglobin 13.0*   Hematocrit 42   RBC 4.6   Platelets 284     PT/INR       Lab results: 07/05/20  1110   Protime 11.9   INR 1.0       Radiology impressions (last 3 days):  No results found.    Currently Active/Followed Hospital Problems:  There are no active hospital problems to display for this patient.      Assessment: 60 year old male with past medical history of ESRD status post central line placement.  He is here for central line exchange due to decreased volume through the current central line.    Plan: Tunneled central line exchange.    Author: Truett Mainland, MD  Note created: 07/07/2020  at: 9:51 AM

## 2020-07-07 NOTE — Anesthesia Case Conclusion (Signed)
CASE CONCLUSION  Emergence  Criteria Used for Airway Removal:  Adequate Tv & RR and acceptable O2 saturation  Transport  Directly to: PACU  Airway:  Nasal cannula  Oxygen Delivery:  2 lpm  Position:  Upright  Patient Condition on Handoff  Level of Consciousness:  Alert/talking/calm  Patient Condition:  Stable  Handoff Report to:  RN

## 2020-07-07 NOTE — Anesthesia Preprocedure Evaluation (Signed)
Anesthesia Pre-operative History and Physical for Shane Pearson  .  .  .  Anesthesia Evaluation Information Source: patient, family, records     ANESTHESIA HISTORY     Denies anesthesia history  Pertinent(-):  No History of anesthetic complications or Family hx of anesthetic complications    GENERAL     Denies general issues  Pertinent (-):  No history of anesthetic complications or Family Hx of Anesthetic Complications    HEENT     Denies HEENT issues PULMONARY  Pertinent(-):  No smoking, asthma or shortness of breath    CARDIOVASCULAR    + CHF  Pertinent(-):  No hypertension, angina, CAD or dysrhythmias    Comment: Low BP -  On fludrocortisone and on midodrine  BP's 70's - 90's, depending on the day according to pt.    GI/HEPATIC/RENAL   NPO: > 8hrs ago (solids)      + GERD          well controlled    + Renal Issues          ESRD, hemodialysis NEURO/PSYCH  Pertinent(-):  No seizures or cerebrovascular event    ENDO/OTHER    + Thyroid Disease  Pertinent(-):  No diabetes mellitus    HEMATOLOGIC    + Arthritis         Physical Exam    Airway            Mouth opening: normal            Mallampati: I            TM distance (fb): >3 FB            Neck ROM: full  Dental   Normal Exam   Cardiovascular           Rhythm: regular           Rate: normal         Pulmonary     breath sounds clear to auscultation    Mental Status     oriented to person, place and time         ________________________________________________________________________  PLAN  ASA Score  3  Anesthetic Plan MAC       General Anesthesia/Sedation Maintenance Plan (propofol infusion); Line ( use current access); Monitoring (standard ASA); Positioning (supine); PONV Plan (ondansetron); Pain (per surgical team and intraop local); PostOp (PACU)    Informed Consent     Risks:          Risks discussed were commensurate with the plan listed above with the following specific points: N/V and sore throat, Damage to: teeth, allergic Rx, unexpected serious injury  and death, Risks of allergic reactions, heart-lung problems, and life-threatening events discussed.  Discussed potential for low BP and need to support it as he had issues with hypotension in the past during anesthesia since he starts off very low.  Plan for MAC and GA LMA as a backup option..    Anesthetic Consent:         Anesthetic plan (and risks as noted above) were discussed with patient and spouse    Plan also discussed with team members including:       surgeon and CRNA    Responsible Anesthesia Provider Attestation:  I attest that the patient or proxy understands and accepts the risks and benefits of the anesthesia plan. I also attest that I have personally performed a pre-anesthetic examination and evaluation, and prescribed the anesthetic plan for this particular  location within 48 hours prior to the anesthetic as documented. Celestia Khat, MD  07/07/20, 9:21 AM

## 2020-07-07 NOTE — Anesthesia Procedure Notes (Signed)
---------------------------------------------------------------------------------------------------------------------------------------    AIRWAY   GENERAL INFORMATION AND STAFF    Patient location during procedure: OR       Date of Procedure: 07/07/2020 11:43 AM  CONDITION PRIOR TO MANIPULATION     Current Airway/Neck Condition:  Normal        For more airway physical exam details, see Anesthesia PreOp Evaluation  AIRWAY METHOD     Patient Position:  Sniffing    Preoxygenated: no      Maintained In-Line Stability: not needed, normal c-spine condition          To see details of medications used, see MAR    Induction: IV  Mask Difficulty Assessment:  0 - not attempted  FINAL AIRWAY DETAILS    Final Airway Type:  Nasal cannula    Head position required to avoid obstruction:  Neutral    Insertion Site:  Left naris and right naris  ----------------------------------------------------------------------------------------------------------------------------------------

## 2020-07-07 NOTE — Discharge Instructions (Signed)
Tunneled Central Line Discharge Instructions   07/07/2020  11:13 AM    Provider performing test/procedure: Dr. Lyman Bishop    Patient received medication? Yes. You have been given medicine, general anesthesia, that may make you sleepy. Do not drive, operate heavy machinery, drink alcoholic beverages, make important personal or business decisions, or sign legal documents until the next day.     You must be with a responsible adult the first night after your procedure.   You can eat your usual diet.   Rest for the remainder of the day. Return to normal activities in 24 hours.   You may not shower or get the dressing wet.   Do not remove the bandage from the catheter site. If bleeding occurs at the catheter site, apply firm pressure to the site for 10 minutes. If the bandage is soaked with blood, apply pressure and call your doctor immediately.   Resume your usual medications.   If you take warfarin (Coumadin), check with your primary doctor before restarting this medicine.   Do not take aspirin (including Anacin), Ibuprofen (Advil,Motrin,Nuprin) or naproxen sodium (Aleve) for the next 24 to 48 hours unless you directed by your primary doctor.   Acetaminophen (Tylenol) is the medication of choice if you have pain.     Call Your Doctor IF:   You have signs of infection at the catheter site: fever, chills, severe pain or swelling, foul smelling drainage.   If your catheter gets pulled out.    Please call [facility] Interventional Radiology for questions or concerns regarding your procedure. Monday-Friday from 7 am- 5 pm call [phone].  After hours and weekends you may call [phone] to be connected to our Answering Service.  The Emergency Department is open 24 hours a day if emergency treatment is required. If your procedure involved collection and analysis of body fluids or tissue, you may see the results in MyChart. If you have questions about the results, please contact the health care  provider who requested that you have this procedure done.

## 2020-07-07 NOTE — Procedures (Signed)
Procedure Report           Time out documentation completed prior to procedure:  Yes    Indications/Pre-Procedure diagnosis:  HD tunneled central line malfuction    Procedure performed: Tunneled central line exchange    Guide Wire Removed:Yes    Findings/Procedure Summary (detailed report located in the Image tab): Successful exchange of tunneled central line with 14.5 F x 19 cm palindrome catheter.    Complications:  None immediate    Condition:  good    EBL: minimal    Specimens:  no    Operators: Dr. Lyman Bishop, Dr. Truett Mainland    Disposition:  Home    Post-Procedure Diagnosis: Same    Truett Mainland, MD  07/07/2020  12:30 PM

## 2020-07-09 ENCOUNTER — Encounter: Payer: Medicare Other | Admitting: Nephrology

## 2020-07-09 DIAGNOSIS — N186 End stage renal disease: Secondary | ICD-10-CM

## 2020-07-09 DIAGNOSIS — Z992 Dependence on renal dialysis: Secondary | ICD-10-CM

## 2020-08-09 ENCOUNTER — Encounter: Payer: Medicare Other | Admitting: Nephrology

## 2020-08-09 DIAGNOSIS — Z992 Dependence on renal dialysis: Secondary | ICD-10-CM

## 2020-08-09 DIAGNOSIS — N186 End stage renal disease: Secondary | ICD-10-CM

## 2020-09-08 ENCOUNTER — Encounter: Payer: Medicare Other | Admitting: Nephrology

## 2020-09-08 DIAGNOSIS — N186 End stage renal disease: Secondary | ICD-10-CM

## 2020-09-08 DIAGNOSIS — Z992 Dependence on renal dialysis: Secondary | ICD-10-CM

## 2020-10-09 ENCOUNTER — Encounter: Payer: Medicare Other | Admitting: Nephrology

## 2020-10-09 DIAGNOSIS — N186 End stage renal disease: Secondary | ICD-10-CM

## 2020-10-09 DIAGNOSIS — Z992 Dependence on renal dialysis: Secondary | ICD-10-CM

## 2020-12-09 ENCOUNTER — Encounter: Payer: Medicare Other | Admitting: Nephrology

## 2020-12-09 DIAGNOSIS — N186 End stage renal disease: Secondary | ICD-10-CM

## 2020-12-09 DIAGNOSIS — Z992 Dependence on renal dialysis: Secondary | ICD-10-CM

## 2021-01-10 ENCOUNTER — Other Ambulatory Visit: Payer: Self-pay | Admitting: Family

## 2021-01-10 DIAGNOSIS — Z992 Dependence on renal dialysis: Secondary | ICD-10-CM

## 2021-01-23 ENCOUNTER — Telehealth: Payer: Self-pay

## 2021-01-23 DIAGNOSIS — Z01812 Encounter for preprocedural laboratory examination: Secondary | ICD-10-CM

## 2021-01-23 NOTE — Telephone Encounter (Signed)
Attempted pre-call, left voicemail for patient and MyChart message sent.       We will contact you with your arrival time for your central line placement there day before your procedure.     When you arrive at [facility] walk through the main lobby, once in the hallway you will see green tags on the ceiling. Follow those green tags to the green elevators. Take the green elevators to the ground floor and then follow the signs to Imaging Check-In.     You will need to have blood drawn prior to your procedure. Please go to any [facility] to have this collected. These are NOT fasting labs. Please bring a photo ID and your insurance card.     Please get Covid tested 3-5 days prior to your procedure on 11/17, 11/18 or 11/19.  o Location of Covid testing at [address], [place] [address]. They are open M-F from 7:30 am to 5 pm and Sat/Sun from 8 am to 12 pm. You do not need to make an appointment, it is a walk-in clinic.     You may have clear liquids (water, apple juice, ginger-ale, coffee or tea with no cream) up to 2 hours before your procedure.     Nothing to eat for 8 hours before procedure.     Please hold the following medications: Aspirin (ASA) for 5 days prior to your procedure. Take your last dose on Wednesday 11/16. You may resume taking as prescribed 24 hours post procedure.     Hold all vitamins and supplements the day of your procedure. May resume post procedure.     Please take all other scheduled medications with a sip of water the morning of your procedure.      You must have someone to drive you home after your procedure.  This driver cannot be a ride share [place] Popper, [place] or non-medical taxi) unless you have someone accompany you.     You should have someone spend the night with you after your procedure.     If you need to reschedule this appointment or if you have any questions AFTER your procedure please call [phone].     If you have any question PRIOR to your procedure  please call [phone].

## 2021-01-25 ENCOUNTER — Telehealth: Payer: Self-pay

## 2021-01-25 NOTE — Telephone Encounter (Signed)
Pre-call attempted. Voicemail left.

## 2021-01-31 ENCOUNTER — Other Ambulatory Visit: Payer: Medicare Other

## 2021-02-08 ENCOUNTER — Encounter: Payer: Medicare Other | Admitting: Nephrology

## 2021-02-08 DIAGNOSIS — N186 End stage renal disease: Secondary | ICD-10-CM

## 2021-02-08 DIAGNOSIS — Z992 Dependence on renal dialysis: Secondary | ICD-10-CM

## 2021-03-11 ENCOUNTER — Encounter: Payer: Medicare Other | Admitting: Nephrology

## 2021-03-11 DIAGNOSIS — Z992 Dependence on renal dialysis: Secondary | ICD-10-CM

## 2021-03-11 DIAGNOSIS — N186 End stage renal disease: Secondary | ICD-10-CM

## 2021-04-11 ENCOUNTER — Encounter: Payer: Medicare Other | Admitting: Nephrology

## 2021-04-11 DIAGNOSIS — N186 End stage renal disease: Secondary | ICD-10-CM

## 2021-04-11 DIAGNOSIS — Z992 Dependence on renal dialysis: Secondary | ICD-10-CM

## 2021-05-09 ENCOUNTER — Encounter: Payer: Medicare Other | Admitting: Nephrology

## 2021-05-09 DIAGNOSIS — Z992 Dependence on renal dialysis: Secondary | ICD-10-CM

## 2021-05-09 DIAGNOSIS — N186 End stage renal disease: Secondary | ICD-10-CM

## 2021-06-09 ENCOUNTER — Encounter: Payer: Medicare Other | Admitting: Nephrology

## 2021-06-09 DIAGNOSIS — Z992 Dependence on renal dialysis: Secondary | ICD-10-CM

## 2021-06-09 DIAGNOSIS — N186 End stage renal disease: Secondary | ICD-10-CM

## 2021-06-29 ENCOUNTER — Other Ambulatory Visit
Admission: RE | Admit: 2021-06-29 | Discharge: 2021-06-29 | Disposition: A | Discharge status: Home / Self Care | Payer: Medicare Other | Source: Ambulatory Visit | Attending: Nephrology | Admitting: Nephrology

## 2021-06-29 DIAGNOSIS — Z0189 Encounter for other specified special examinations: Secondary | ICD-10-CM | POA: Insufficient documentation

## 2021-06-30 LAB — HEPATITIS B SURFACE ANTIGEN: HBV S Ag: NEGATIVE

## 2021-06-30 LAB — HEPATITIS B CORE ANTIBODY, TOTAL: HBV Core Ab: NEGATIVE

## 2021-06-30 LAB — HEPATITIS B SURFACE ANTIBODY
HBV S Ab Quant: 37.11 m[IU]/mL
HBV S Ab: POSITIVE

## 2021-06-30 LAB — HEB B INT

## 2021-07-13 ENCOUNTER — Other Ambulatory Visit: Payer: Self-pay | Admitting: Family

## 2021-07-13 DIAGNOSIS — Z992 Dependence on renal dialysis: Secondary | ICD-10-CM

## 2021-08-02 ENCOUNTER — Telehealth: Payer: Self-pay

## 2021-08-02 DIAGNOSIS — Z01812 Encounter for preprocedural laboratory examination: Secondary | ICD-10-CM

## 2021-08-02 NOTE — Telephone Encounter (Signed)
Pre-call completed with patient. Pre-procedure instructions reviewed and sent in a MyChart Message, see below for details. Patient to get labs done at his dialysis clinic [facility] [place]). Labs faxed. Patient verbalized understanding of pre-procedure instructions.    . Your Tunneled Line Exchange is scheduled for Wednesday, May 31st. You will receive a call the day before your procedure with an arrival time.    . When you arrive at [facility]; walk through the main lobby and follow the green tags on the ceiling to the green elevators. Take the green elevators to the ground floor and then follow the signs to Imaging Check-In.    Marland Kitchen You will need to have blood drawn prior to your procedure. Please go to your [facility] to have this collected. These are NOT fasting labs. Please bring a photo ID and your insurance card. Please have an INR and a Platelet Count performed. Please fax results to [phone].     . You should have someone spend the night with you after your procedure.    . No Covid test is required    . You may have clear liquids (water, apple juice, ginger-ale, coffee or tea with no cream) up to 2 hours before your procedure.    . Nothing to eat for 8 hours before procedure.    . Please hold the following medications: None.    . Hold all vitamins and supplements the day of your procedure. May resume post procedure.    . Please take all other prescribed medications with a sip of water the morning of your procedure.     . You must have someone to drive you home after your procedure.  This driver cannot be a ride share [employer] Goad, [place] or non-medical taxi) unless you have someone accompany you.    . If you need to reschedule this appointment or if you have any questions AFTER your procedure please call [phone].    . If you have any question PRIOR to your procedure please call [phone].

## 2021-08-03 ENCOUNTER — Other Ambulatory Visit
Admission: RE | Admit: 2021-08-03 | Discharge: 2021-08-03 | Disposition: A | Discharge status: Home / Self Care | Payer: Medicare Other | Source: Ambulatory Visit | Attending: Radiology | Admitting: Radiology

## 2021-08-03 DIAGNOSIS — Z01812 Encounter for preprocedural laboratory examination: Secondary | ICD-10-CM | POA: Insufficient documentation

## 2021-08-03 LAB — PLATELET COUNT: Platelets: 327 10*3/uL (ref 150–330)

## 2021-08-03 LAB — PROTIME-INR
INR: 1 (ref 0.9–1.1)
Protime: 11.1 s (ref 10.0–12.9)

## 2021-08-09 ENCOUNTER — Encounter: Payer: Medicare Other | Admitting: Nephrology

## 2021-08-09 ENCOUNTER — Ambulatory Visit
Admission: RE | Admit: 2021-08-09 | Discharge: 2021-08-09 | Disposition: A | Discharge status: Home / Self Care | Payer: Medicare Other | Source: Ambulatory Visit | Attending: Radiology | Admitting: Radiology

## 2021-08-09 ENCOUNTER — Other Ambulatory Visit: Payer: Medicare Other

## 2021-08-09 ENCOUNTER — Ambulatory Visit: Payer: Medicare Other | Admitting: Anesthesiology

## 2021-08-09 ENCOUNTER — Other Ambulatory Visit: Payer: Self-pay

## 2021-08-09 DIAGNOSIS — Y841 Kidney dialysis as the cause of abnormal reaction of the patient, or of later complication, without mention of misadventure at the time of the procedure: Secondary | ICD-10-CM | POA: Insufficient documentation

## 2021-08-09 DIAGNOSIS — Y9289 Other specified places as the place of occurrence of the external cause: Secondary | ICD-10-CM | POA: Insufficient documentation

## 2021-08-09 DIAGNOSIS — I120 Hypertensive chronic kidney disease with stage 5 chronic kidney disease or end stage renal disease: Secondary | ICD-10-CM | POA: Insufficient documentation

## 2021-08-09 DIAGNOSIS — N186 End stage renal disease: Secondary | ICD-10-CM

## 2021-08-09 DIAGNOSIS — Z992 Dependence on renal dialysis: Secondary | ICD-10-CM | POA: Insufficient documentation

## 2021-08-09 DIAGNOSIS — I509 Heart failure, unspecified: Secondary | ICD-10-CM | POA: Insufficient documentation

## 2021-08-09 DIAGNOSIS — X58XXXA Exposure to other specified factors, initial encounter: Secondary | ICD-10-CM | POA: Insufficient documentation

## 2021-08-09 DIAGNOSIS — T82828A Fibrosis of vascular prosthetic devices, implants and grafts, initial encounter: Secondary | ICD-10-CM | POA: Insufficient documentation

## 2021-08-09 DIAGNOSIS — I110 Hypertensive heart disease with heart failure: Secondary | ICD-10-CM | POA: Insufficient documentation

## 2021-08-09 DIAGNOSIS — T82590A Other mechanical complication of surgically created arteriovenous fistula, initial encounter: Secondary | ICD-10-CM

## 2021-08-09 MED ORDER — PROPOFOL 10 MG/ML IV EMUL (INTERMITTENT DOSING) WRAPPED *I*
INTRAVENOUS | Status: AC
Start: 2021-08-09 — End: 2021-08-09
  Filled 2021-08-09: qty 20

## 2021-08-09 MED ORDER — LIDOCAINE HCL 2 % IJ SOLN *I*
INTRAMUSCULAR | Status: DC | PRN
Start: 2021-08-09 — End: 2021-08-09
  Administered 2021-08-09: 100 mg via INTRAVENOUS

## 2021-08-09 MED ORDER — HYDROMORPHONE HCL PF 1 MG/ML IJ SOLN *WRAPPED*
0.5000 mg | INTRAMUSCULAR | Status: DC | PRN
Start: 2021-08-09 — End: 2021-08-09

## 2021-08-09 MED ORDER — LIDOCAINE HCL 1 % IJ SOLN *I*
INTRAMUSCULAR | Status: AC
Start: 2021-08-09 — End: 2021-08-09
  Filled 2021-08-09: qty 20

## 2021-08-09 MED ORDER — FENTANYL CITRATE 50 MCG/ML IJ SOLN *WRAPPED*
INTRAMUSCULAR | Status: AC
Start: 2021-08-09 — End: 2021-08-09
  Filled 2021-08-09: qty 2

## 2021-08-09 MED ORDER — EPHEDRINE 5MG/ML IN NS IV/IJ *WRAPPED*
INTRAMUSCULAR | Status: AC
Start: 2021-08-09 — End: 2021-08-09
  Filled 2021-08-09: qty 5

## 2021-08-09 MED ORDER — FENTANYL CITRATE 50 MCG/ML IJ SOLN *WRAPPED*
INTRAMUSCULAR | Status: DC | PRN
Start: 2021-08-09 — End: 2021-08-09
  Administered 2021-08-09 (×2): 25 ug via INTRAVENOUS

## 2021-08-09 MED ORDER — OXYCODONE HCL 5 MG/5ML PO SOLN *I*
5.0000 mg | Freq: Once | ORAL | Status: DC | PRN
Start: 2021-08-09 — End: 2021-08-09

## 2021-08-09 MED ORDER — NALOXONE HCL 0.4 MG/ML IJ SOLN *WRAPPED*
0.1000 mg | Status: DC | PRN
Start: 2021-08-09 — End: 2021-08-09

## 2021-08-09 MED ORDER — ONDANSETRON HCL 2 MG/ML IV SOLN *I*
INTRAMUSCULAR | Status: AC
Start: 2021-08-09 — End: 2021-08-09
  Filled 2021-08-09: qty 2

## 2021-08-09 MED ORDER — NOREPINEPHRINE 8 MG IN NS 250 ML INFUSION *WRAPPED*
INTRAVENOUS | Status: DC | PRN
Start: 2021-08-09 — End: 2021-08-09
  Administered 2021-08-09: 5 ug/min via INTRAVENOUS
  Administered 2021-08-09: 7 ug/min via INTRAVENOUS
  Administered 2021-08-09: 5 ug/min via INTRAVENOUS
  Administered 2021-08-09: 3 ug/min via INTRAVENOUS

## 2021-08-09 MED ORDER — OXYCODONE HCL 5 MG/5ML PO SOLN *I*
10.0000 mg | Freq: Once | ORAL | Status: DC | PRN
Start: 2021-08-09 — End: 2021-08-09

## 2021-08-09 MED ORDER — DEXAMETHASONE SODIUM PHOSPHATE 4 MG/ML INJ SOLN *WRAPPED*
INTRAMUSCULAR | Status: AC
Start: 2021-08-09 — End: 2021-08-09
  Filled 2021-08-09: qty 1

## 2021-08-09 MED ORDER — ONDANSETRON HCL 2 MG/ML IV SOLN *I*
INTRAMUSCULAR | Status: DC | PRN
Start: 2021-08-09 — End: 2021-08-09
  Administered 2021-08-09: 4 mg via INTRAVENOUS

## 2021-08-09 MED ORDER — HYDROMORPHONE HCL PF 1 MG/ML IJ SOLN *WRAPPED*
0.2500 mg | INTRAMUSCULAR | Status: DC | PRN
Start: 2021-08-09 — End: 2021-08-09

## 2021-08-09 MED ORDER — LACTATED RINGERS IV SOLN *I*
INTRAVENOUS | Status: DC | PRN
Start: 2021-08-09 — End: 2021-08-09

## 2021-08-09 MED ORDER — PROPOFOL 10 MG/ML IV EMUL (INTERMITTENT DOSING) WRAPPED *I*
INTRAVENOUS | Status: AC
Start: 2021-08-09 — End: 2021-08-09
  Filled 2021-08-09: qty 50

## 2021-08-09 MED ORDER — PHENYLEPHRINE 100 MCG/ML IN NS 10 ML *WRAPPED*
100.0000 ug | INTRAMUSCULAR | Status: DC | PRN
Start: 2021-08-09 — End: 2021-08-09

## 2021-08-09 MED ORDER — SODIUM CHLORIDE 0.9 % 25 ML IV SOLN *I*
5.0000 mg | Freq: Once | INTRAVENOUS | Status: DC | PRN
Start: 2021-08-09 — End: 2021-08-09

## 2021-08-09 MED ORDER — VASOPRESSIN 2 UNITS/2ML (1UNIT/ML) IN NS SYRINGE *I*
INTRAVENOUS | Status: AC
Start: 2021-08-09 — End: 2021-08-09
  Filled 2021-08-09: qty 2

## 2021-08-09 MED ORDER — CEFAZOLIN 1000 MG IN STERILE WATER 10ML SYRINGE *I*
PREFILLED_SYRINGE | INTRAVENOUS | Status: AC
Start: 2021-08-09 — End: 2021-08-09
  Filled 2021-08-09: qty 10

## 2021-08-09 MED ORDER — CEFAZOLIN 1000 MG IN STERILE WATER 10ML SYRINGE *I*
PREFILLED_SYRINGE | INTRAVENOUS | Status: DC | PRN
  Administered 2021-08-09: 2000 mg via INTRAVENOUS

## 2021-08-09 MED ORDER — PROPOFOL 10 MG/ML IV EMUL (INTERMITTENT DOSING) WRAPPED *I*
INTRAVENOUS | Status: DC | PRN
Start: 2021-08-09 — End: 2021-08-09
  Administered 2021-08-09: 20 mg via INTRAVENOUS

## 2021-08-09 MED ORDER — VASOPRESSIN 2 UNITS/2ML (1UNIT/ML) IN NS SYRINGE *I*
INTRAVENOUS | Status: DC | PRN
Start: 2021-08-09 — End: 2021-08-09
  Administered 2021-08-09: 1 [IU] via INTRAVENOUS

## 2021-08-09 MED ORDER — GLYCOPYRROLATE 0.2 MG/ML IJ SOLN *WRAPPED*
INTRAMUSCULAR | Status: DC | PRN
  Administered 2021-08-09: .2 mg via INTRAVENOUS

## 2021-08-09 MED ORDER — DEXAMETHASONE SODIUM PHOSPHATE 4 MG/ML INJ SOLN *WRAPPED*
INTRAMUSCULAR | Status: DC | PRN
Start: 2021-08-09 — End: 2021-08-09
  Administered 2021-08-09: 4 mg via INTRAVENOUS

## 2021-08-09 MED ORDER — HEPARIN SODIUM (PORCINE) 1000 UNIT/ML IJ SOLN *WRAPPED*
Status: AC
Start: 2021-08-09 — End: 2021-08-09
  Filled 2021-08-09: qty 10

## 2021-08-09 MED ORDER — GLYCOPYRROLATE 1 MG / 5 ML IJ SOLN *I*
INTRAMUSCULAR | Status: AC
Start: 2021-08-09 — End: 2021-08-09
  Filled 2021-08-09: qty 5

## 2021-08-09 MED ORDER — HALOPERIDOL LACTATE 5 MG/ML IJ SOLN *I*
0.5000 mg | Freq: Once | INTRAMUSCULAR | Status: DC | PRN
Start: 2021-08-09 — End: 2021-08-09

## 2021-08-09 MED ORDER — ACETAMINOPHEN 500 MG PO TABS *I*
1000.0000 mg | ORAL_TABLET | Freq: Once | ORAL | Status: DC
Start: 2021-08-09 — End: 2021-08-09

## 2021-08-09 MED ORDER — ONDANSETRON HCL 2 MG/ML IV SOLN *I*
1.0000 mg | Freq: Once | INTRAMUSCULAR | Status: DC | PRN
Start: 2021-08-09 — End: 2021-08-09

## 2021-08-09 MED ORDER — PROPOFOL INFUSION 10 MG/ML *I*
INTRAVENOUS | Status: DC | PRN
Start: 2021-08-09 — End: 2021-08-09
  Administered 2021-08-09 (×2): 25 ug/kg/min via INTRAVENOUS
  Administered 2021-08-09: 50 ug/kg/min via INTRAVENOUS

## 2021-08-09 MED ORDER — LIDOCAINE HCL 2 % (PF) IJ SOLN *I*
INTRAMUSCULAR | Status: AC
Start: 2021-08-09 — End: 2021-08-09
  Filled 2021-08-09: qty 5

## 2021-08-09 NOTE — Interval H&P Note (Signed)
UPDATES TO PATIENT'S CONDITION on the DAY OF SURGERY/PROCEDURE    I. Updates to Patient's Condition (to be completed by a provider privileged to complete a H&P, following reassessment of the patient by the provider):    Day of Surgery/Procedure Update:  History  History reviewed and no change    Physical  Physical exam updated and no change            II. Procedure Readiness   I have reviewed the patient's H&P and updated condition. By completing and signing this form, I attest that this patient is ready for surgery/procedure.    III. Attestation   I have reviewed the updated information regarding the patient's condition and it is appropriate to proceed with the planned surgery/procedure.      Elvia Collum, MD as of 1:55 PM 08/09/2021

## 2021-08-09 NOTE — Discharge Instructions (Signed)
Tunneled Central Line Exchanged Discharge Instructions   08/09/2021  2:32 PM    Provider performing test/procedure: Dr. Elvia Collum    You must be with a responsible adult the first night after your procedure.  You can eat your usual diet.  Rest for the remainder of the day. Return to normal activities in 24 hours.  You may not shower or get the dressing wet.  Do not remove the bandage from the catheter site. If bleeding occurs at the catheter site, apply firm pressure to the site for 10 minutes. If the bandage is soaked with blood, apply pressure and call your doctor immediately.  Resume your usual medications.  If you take warfarin (Coumadin), check with your primary doctor before restarting this medicine.  Do not take aspirin (including Anacin), Ibuprofen (Advil,Motrin,Nuprin) or naproxen sodium (Aleve) for the next 24 to 48 hours unless you directed by your primary doctor.  Acetaminophen (Tylenol) is the medication of choice if you have pain.     Call Your Doctor IF:  You have signs of infection at the catheter site: fever, chills, severe pain or swelling, foul smelling drainage.  If your catheter gets pulled out.    Please call [facility] Interventional Radiology for questions or concerns regarding your procedure. Monday-Friday from 7 am- 5 pm call [phone].  After hours and weekends you may call [phone] (be sure to select OPTION 2) to be connected to our Answering Service.  The Emergency Department is open 24 hours a day if emergency treatment is required. If your procedure involved collection and analysis of body fluids or tissue, you may see the results in MyChart. If you have questions about the results, please contact the health care provider who requested that you have this procedure done.

## 2021-08-09 NOTE — Preop H&P (Signed)
Interventional Radiology Pre-Procedure History and Physical    HPI:  Jovany Stevanus is a 61 y.o. male with ESRD and chronic left tunneled HD line who presents for exchanged due to poor flow at HD.    He has no complaints today.    ROS:  Negative except per HPI.    Current Medications:  Current Outpatient Medications   Medication   . sevelamer (RENVELA) 800 MG tablet   . acetaminophen (TYLENOL) 325 MG tablet   . aspirin 81 MG EC tablet   . midodrine (PROAMATINE) 10 MG tablet   . fluticasone (FLONASE) 50 MCG/ACT nasal spray   . atorvastatin (LIPITOR) 10 MG tablet   . fludrocortisone (FLORINEF) 0.1 mg tablet   . b complex-vitamin c-folic acid (NEPHRO-VITE) tablet   . calcium-vitamin D (OSCAL-500) 500-200 MG-UNIT per tablet   . cinacalcet (SENSIPAR) 30 MG tablet   . omeprazole (PRILOSEC) 20 MG capsule   . minocycline (MINOCIN) 100 MG capsule     No current facility-administered medications for this encounter.         Allergies   Allergen Reactions   . Penicillins Swelling     Face   . Coconut Flavor Nausea And Vomiting   . Amoxicillin Rash   . Baclofen Other (See Comments)     confusion   . No Known Latex Allergy        Past Medical History:   Diagnosis Date   . Arthritis    . CHF (congestive heart failure)    . Colon polyp    . Dialysis patient    . ESRD (end stage renal disease)    . GERD (gastroesophageal reflux disease)    . Hypercholesterolemia    . Hypotension    . Ischemic colitis    . Thyroid disease     hypo       Past Surgical History:   Procedure Laterality Date   . DIALYSIS FISTULA CREATION Bilateral     4 fistulas have failed- ? d/t low BP and small veins   . HERNIA REPAIR     . Right inguinal hernia repair         Social History     Socioeconomic History   . Marital status: Married   Tobacco Use   . Smoking status: Former     Packs/day: 0.30     Years: 15.00     Pack years: 4.50     Types: Cigarettes     Quit date: 08/02/1992     Years since quitting: 29.0   . Smokeless tobacco: Never   Substance and  Sexual Activity   . Alcohol use: No   . Drug use: No         Family History   Problem Relation Age of Onset   . Diabetes Mother    . Asthma Mother    . Coronary artery disease Father        Physical Examination:  BP 94/58 (BP Location: Left arm)   Temp 36.4 C (97.5 F) (Temporal)   Resp 16   Ht 162.6 cm (5\' 4" )   Wt 86.2 kg (190 lb)   SpO2 94%   BMI 32.61 kg/m     General: No acute distress, resting comfortably  Head; normocephalic, atraumatic  Eyes: Extra-occular movements intact  Neck: Trachea midline  Lymphatic: No visible lymphadenopathy  Cardiac: extremities warm and well perfused  Respiratory: Non-labored breathing, bilateral breath sounds  Abdomen: Non-distended  Neurological: No focal deficits. Cranial nerves grossly  intact.  Psychiatric: Alert and oriented x3; interactive; appropriate affect  Extremities: Moves all well. No lower extremity edema    Labs:      Lab results: 08/03/21  0800   Platelets 327       No results for input(s): NA, K, CL, CO2, UN, CREAT, GFRC, GFRB, GLU, CA, TP, ALB, ALT, AST, ALK, TB in the last 8760 hours.        Lab results: 08/03/21  0800   INR 1.0         Imaging:  No results found.    Assessment:   Advik Mcginley is a 61 y.o. male with ESRD and chronic left tunneled HD line who presents for exchanged due to poor flow at HD.    Plan:  Left HD catheter exchange today    Venetia Night, MD  Interventional Radiology, PGY-6  1:01 PM 08/09/21

## 2021-08-09 NOTE — Anesthesia Preprocedure Evaluation (Addendum)
Anesthesia Pre-operative History and Physical for Shane Pearson    Highlighted Issues for this Procedure:  61 y.o. male going to IR for tunneled central line.    PMH:  - hypotension on  fludrocortisone and midodrine  - GERD   - dialysis T,Th,Sa        .  Marland Kitchen  Anesthesia Evaluation Information Source: patient, family, records     ANESTHESIA HISTORY     Denies anesthesia history  Pertinent(-):  No History of anesthetic complications or Family hx of anesthetic complications    GENERAL     Denies general issues  Pertinent (-):  No history of anesthetic complications or Family Hx of Anesthetic Complications    HEENT     Denies HEENT issues PULMONARY  Pertinent(-):  No smoking or shortness of breath    CARDIOVASCULAR  Good(4+METs) Exercise Tolerance  Pertinent(-):  No hypertension or angina    GI/HEPATIC/RENAL   NPO: > 8hrs ago (solids) and > 2hrs ago (clears)      + GERD          well controlled    + Renal Issues          ESRD, hemodialysis  Pertinent(-):  No alcohol use  NEURO/PSYCH/ORTHO     Denies neuro/psych/ortho issues  Pertinent(-):  No seizures, cerebrovascular event or positioning issues    ENDO/OTHER    + Thyroid Disease  Pertinent(-):  No diabetes mellitus    HEMATOLOGIC    + Arthritis         Physical Exam    Airway            Mouth opening: normal            Mallampati: III            TM distance (fb): >3 FB            Neck ROM: full            Comment: Large tongue            Airway Impression: difficult  Dental    Comment: Multiple missing teeth   Cardiovascular           Rhythm: regular           Rate: normal         Pulmonary     breath sounds clear to auscultation    Mental Status     oriented to person, place and time         ________________________________________________________________________  PLAN  ASA Score  3  Anesthetic Plan MAC - Will convert to general if ventilation inadequate       General Anesthesia/Sedation Maintenance Plan (IV bolus);  Airway Manipulation (none); Airway (nasal cannula);  Line ( use current access); Monitoring (standard ASA); Positioning (supine); PONV Plan (propofol infusion); Pain (per surgical team); PostOp (PACU)Standard Attestation    Informed Consent     Risks:         Risks discussed were commensurate with the plan listed above with the following specific points: fatigue and hypotension, Damage to: teeth and eyes, awareness and unexpected serious injury.    Anesthetic Consent:         Anesthetic plan (and risks as noted above) were discussed with patient    Plan also discussed with team members including:       CRNA and attending    Responsible Anesthesia Provider Attestation:  I attest that the patient or proxy understands and accepts the risks  and benefits of the anesthesia plan. I also attest that I have personally performed a pre-anesthetic examination and evaluation, and prescribed the anesthetic plan for this particular location within 48 hours prior to the anesthetic as documented. Nita Sells, MD  08/09/21, 1:48 PM

## 2021-08-09 NOTE — Anesthesia Procedure Notes (Signed)
---------------------------------------------------------------------------------------------------------------------------------------    AIRWAY   GENERAL INFORMATION AND STAFF    Patient location during procedure: Procedure Rm       Date of Procedure: 08/09/2021 1:40 PM  CONDITION PRIOR TO MANIPULATION     Current Airway/Neck Condition:  Normal        For more airway physical exam details, see Anesthesia PreOp Evaluation  AIRWAY METHOD     Patient Position:  Sniffing    Preoxygenated: yes      Maintained In-Line Stability: not needed, normal c-spine condition          To see details of medications used, see MAR    Mask Difficulty Assessment:  0 - not attempted    Number of Attempts at Approach:  1  FINAL AIRWAY DETAILS    Final Airway Type:  Nasal cannula    Head position required to avoid obstruction:  Neutral  ADDITIONAL COMMENTS   Natural airway  ----------------------------------------------------------------------------------------------------------------------------------------

## 2021-08-09 NOTE — Procedures (Signed)
Procedure Report           Time out documentation completed prior to procedure:  Yes    Indications/Pre-Procedure diagnosis:  Poor flows through HD cath    Procedure performed: IR tunneled central line placement (e.g. dialysis, apheresis cath)    Guide Wire Removed:Yes    Findings/Procedure Summary (detailed report located in the Image tab): Left HD cath with severe fibrin sheath treated with 10 - 14 mm POBA. New catheter placed.    Complications:  No immediate complications    Condition:  good    EBL: Minimal    Specimens:  N/A    Operators: Dr. Elvia Collum and Dr. Corlis Leak    Disposition:  PACU then home    Post-Procedure Diagnosis: Unchanged    Venetia Night, MD  08/09/2021  2:31 PM

## 2021-08-09 NOTE — Progress Notes (Signed)
Verbal sign-out received from Nita Sells, MD.

## 2021-08-09 NOTE — Anesthesia Postprocedure Evaluation (Addendum)
Anesthesia Post-Op Note    Patient: Shane Pearson    Procedure(s) Performed:  Procedure Summary  Date:  08/09/2021 Anesthesia Start: 08/09/2021  1:22 PM Anesthesia Stop: 08/09/2021  2:54 PM Room / Location:  * No operating room entered * / [facility] IR/[facility]   IR tunneled catheter Diagnosis:  * No pre-op diagnosis entered * * No surgeons listed * Responsible Anesthesia Provider:  Nita Sells, MD         Recovery Vitals  BP: (!) 84/57 (08/09/2021  4:30 PM)  Heart Rate: 68 (08/09/2021  4:30 PM)  Heart Rate (via Pulse Ox): 66 (08/09/2021  4:30 PM)  Resp: 14 (08/09/2021  4:30 PM)  Temp: 36.3 C (97.3 F) (08/09/2021  4:30 PM)  SpO2: 99 % (08/09/2021  4:30 PM)  O2 Flow Rate: 2 L/min (08/09/2021  2:45 PM)   0-10 Scale: 0 (08/09/2021  4:30 PM)    Anesthesia type:  MAC  Complications Noted During Procedure or in PACU:  None   Comment:    Patient Location:  PACU  Level of Consciousness:    Recovered to baseline  Patient Participation:     Able to participate  Temperature Status:    Normothermic  Oxygen Saturation:    Within patient's normal range  Cardiac Status:   Within patient's normal range  Fluid Status:    Stable  Airway Patency:     Yes  Pulmonary Status:    Baseline  Pain Management:    Adequate analgesia  Nausea and Vomiting:  None    Post Op Assessment:    Tolerated procedure well  Responsible Anesthesia Provider Attestation:  All indicated post anesthesia care provided       -

## 2021-08-09 NOTE — H&P (View-Only) (Signed)
Interventional Radiology Pre-Procedure History and Physical    HPI:  Shane Pearson is a 60 y.o. male with ESRD and chronic left tunneled HD line who presents for exchanged due to poor flow at HD.    He has no complaints today.    ROS:  Negative except per HPI.    Current Medications:  Current Outpatient Medications   Medication   . sevelamer (RENVELA) 800 MG tablet   . acetaminophen (TYLENOL) 325 MG tablet   . aspirin 81 MG EC tablet   . midodrine (PROAMATINE) 10 MG tablet   . fluticasone (FLONASE) 50 MCG/ACT nasal spray   . atorvastatin (LIPITOR) 10 MG tablet   . fludrocortisone (FLORINEF) 0.1 mg tablet   . b complex-vitamin c-folic acid (NEPHRO-VITE) tablet   . calcium-vitamin D (OSCAL-500) 500-200 MG-UNIT per tablet   . cinacalcet (SENSIPAR) 30 MG tablet   . omeprazole (PRILOSEC) 20 MG capsule   . minocycline (MINOCIN) 100 MG capsule     No current facility-administered medications for this encounter.         Allergies   Allergen Reactions   . Penicillins Swelling     Face   . Coconut Flavor Nausea And Vomiting   . Amoxicillin Rash   . Baclofen Other (See Comments)     confusion   . No Known Latex Allergy        Past Medical History:   Diagnosis Date   . Arthritis    . CHF (congestive heart failure)    . Colon polyp    . Dialysis patient    . ESRD (end stage renal disease)    . GERD (gastroesophageal reflux disease)    . Hypercholesterolemia    . Hypotension    . Ischemic colitis    . Thyroid disease     hypo       Past Surgical History:   Procedure Laterality Date   . DIALYSIS FISTULA CREATION Bilateral     4 fistulas have failed- ? d/t low BP and small veins   . HERNIA REPAIR     . Right inguinal hernia repair         Social History     Socioeconomic History   . Marital status: Married   Tobacco Use   . Smoking status: Former     Packs/day: 0.30     Years: 15.00     Pack years: 4.50     Types: Cigarettes     Quit date: 08/02/1992     Years since quitting: 29.0   . Smokeless tobacco: Never   Substance and  Sexual Activity   . Alcohol use: No   . Drug use: No         Family History   Problem Relation Age of Onset   . Diabetes Mother    . Asthma Mother    . Coronary artery disease Father        Physical Examination:  BP 94/58 (BP Location: Left arm)   Temp 36.4 C (97.5 F) (Temporal)   Resp 16   Ht 162.6 cm (5' 4")   Wt 86.2 kg (190 lb)   SpO2 94%   BMI 32.61 kg/m     General: No acute distress, resting comfortably  Head; normocephalic, atraumatic  Eyes: Extra-occular movements intact  Neck: Trachea midline  Lymphatic: No visible lymphadenopathy  Cardiac: extremities warm and well perfused  Respiratory: Non-labored breathing, bilateral breath sounds  Abdomen: Non-distended  Neurological: No focal deficits. Cranial nerves grossly   intact.  Psychiatric: Alert and oriented x3; interactive; appropriate affect  Extremities: Moves all well. No lower extremity edema    Labs:      Lab results: 08/03/21  0800   Platelets 327       No results for input(s): NA, K, CL, CO2, UN, CREAT, GFRC, GFRB, GLU, CA, TP, ALB, ALT, AST, ALK, TB in the last 8760 hours.        Lab results: 08/03/21  0800   INR 1.0         Imaging:  No results found.    Assessment:   Shane Pearson is a 60 y.o. male with ESRD and chronic left tunneled HD line who presents for exchanged due to poor flow at HD.    Plan:  Left HD catheter exchange today    Daeshaun Specht Alan Katoria Yetman, MD  Interventional Radiology, PGY-6  1:01 PM 08/09/21

## 2021-08-09 NOTE — Anesthesia Case Conclusion (Signed)
CASE CONCLUSION  Emergence  Anesthesia emergence: natural airway.  Criteria Used for Airway Removal:  Adequate Tv & RR, acceptable O2 saturation, following commands and strong hand grip  Assessment:  Routine  Transport  Directly to: PACU  Airway:  Nasal cannula  Oxygen Delivery:  4 lpm  Position:  Recumbent  Patient Condition on Handoff  Level of Consciousness:  Alert/talking/calm  Patient Condition:  Stable  Handoff Report to:  RN  Comments: Dentition unchanged

## 2021-08-09 NOTE — Invasive Procedure Plan of Care (Signed)
CONSENT FOR MEDICAL  OR SURGICAL PROCEDURE                            Patient Name: Shane Pearson  [facility] [address]                                                              DOB: [birth year]        . Please read this form or have someone read it to you.  . It's important to understand all parts of this form. If something isn't clear, ask [place] to explain.  . When you sign it, that means you understand the form and give [place] permission to do this surgery or procedure.    . I agree for Barrie Folk, MD , and Vascular and Interventional Radiology Attendings, Fellows, Residents and Advanced Practice Providers   along with any assistants* they may choose, to treat the following condition(s): Venous access  . By doing this surgery or procedure on me: A venous catheter will be exchanged over a wire  . This is also known as:   Marland Kitchen Laterality: Left     *if you'd like a list of the assistants, please ask. We can give that to you.    1. The care provider has explained my condition to me. They have told me how the procedure can help me. They have told me about other ways of treating my condition. I understand the care provider cannot guarantee the result of the procedure. If I don't have this procedure, my other choices are: Not to perform the procedure    2. The care provider has told me the risks (problems that can happen) of the procedure. I understand there may be unwanted results. The risks that are related to this procedure include: Allergic reaction to dye, bleeding, shock, infection, collapse of lung, possible need for chest tube placement, catheter malposition, thrombus, mechanical phlebitis, and in very rare circumstances death.    3. I understand that during the procedure, my care provider may find a condition that we didn't know about before the treatment started. Therefore, I agree that my care provider can perform any other treatment which they think is necessary and available.    4. I give permission to the hospital  and/or its departments to examine and keep tissue, blood, body parts, fluids or materials removed from my body during the procedure(s) to aid in diagnosis and treatment, after which they may be used for scientific research or teaching by appropriate persons. If these materials are used for science or teaching, my identity will be protected. I will no longer own or have any rights to these materials regardless of how they may be used.    5. My care provider might want a representative from a medical device company to be there during my procedure. I understand that person works for:          The ways they might help my care provider during my procedure include:            6. Here are my decisions about receiving blood, blood products, or tissues. I understand my decisions cover the time before, during and after my procedure, my treatment, and my time in  the hospital. After my procedure, if my condition changes a lot, my care provider will talk with me again about receiving blood or blood products. At that time, my care provider might need me to review and sign another consent form, about getting or refusing blood.    I understand that the blood is from the community blood supply. Volunteers donated the blood, the volunteers were screened for health problems. The blood was examined with very sensitive and accurate tests to look for hepatitis, HIV/AIDS, and other diseases. Before I receive blood, it is tested again to make sure it is the correct type.    My chances of getting a sickness from blood products are small. But no transfusion is 100% safe. I understand that my care provider feels the good I will receive from the blood is greater than the chances of something going wrong. My care provider has answered my questions about blood products.      My decision  about blood or  blood products           My decision   about tissue  Implants              I understand this  form.    My care provider  or his/her  assistants  have  explained:   What I am having done and why I need it.  What other choices I can make instead of having this done.  The benefits and possible risks (problems) to me of having this done.  The benefits and possible risks (problems) to me of receiving transplants, blood, or blood products.  There is no guarantee of the results.  The care provider may not stay with me the entire time that I am in the operating or procedure room.  My provider has explained how this may affect my procedure. My provider has answered my  questions about this.         I give my  permission for  this surgery or  procedure.            _______________________________________________                                     My signature  (or parent or other person authorized to sign for you, if you are unable to sign for  yourself or if you are under 29 years old)        ______           Date        _____        Time   Electronic Signatures will display at the bottom of the consent form.    Care provider's statement: I have discussed the planned procedure, including the possibility for transfusion of blood  products or receipt of tissue as necessary; expected benefits; the possible complications and risks; and possible alternatives  and their benefits and risks with the patients or the patient's surrogate. In my opinion, the patient or the patient's surrogate  understands the proposed procedure, its risks, benefits and alternatives.              Electronically signed by: Venetia Night, MD  08/09/2021         Date        1:01 PM        Time

## 2021-09-08 ENCOUNTER — Encounter: Payer: Medicare Other | Admitting: Nephrology

## 2021-09-08 DIAGNOSIS — N186 End stage renal disease: Secondary | ICD-10-CM

## 2021-09-08 DIAGNOSIS — Z992 Dependence on renal dialysis: Secondary | ICD-10-CM

## 2021-10-09 ENCOUNTER — Encounter: Payer: Medicare Other | Admitting: Nephrology

## 2021-10-09 DIAGNOSIS — N186 End stage renal disease: Secondary | ICD-10-CM

## 2021-10-09 DIAGNOSIS — Z992 Dependence on renal dialysis: Secondary | ICD-10-CM

## 2021-11-09 ENCOUNTER — Encounter: Payer: Medicare Other | Admitting: Nephrology

## 2021-11-09 DIAGNOSIS — N186 End stage renal disease: Secondary | ICD-10-CM

## 2021-11-09 DIAGNOSIS — Z992 Dependence on renal dialysis: Secondary | ICD-10-CM

## 2022-01-09 ENCOUNTER — Encounter: Payer: Medicare Other | Admitting: Nephrology

## 2022-01-09 DIAGNOSIS — Z992 Dependence on renal dialysis: Secondary | ICD-10-CM

## 2022-01-09 DIAGNOSIS — N186 End stage renal disease: Secondary | ICD-10-CM

## 2022-03-11 DIAGNOSIS — Z992 Dependence on renal dialysis: Secondary | ICD-10-CM

## 2022-03-11 DIAGNOSIS — N186 End stage renal disease: Secondary | ICD-10-CM

## 2022-04-11 ENCOUNTER — Encounter: Payer: Medicare Other | Admitting: Nephrology

## 2022-04-11 DIAGNOSIS — N186 End stage renal disease: Secondary | ICD-10-CM

## 2022-04-11 DIAGNOSIS — Z992 Dependence on renal dialysis: Secondary | ICD-10-CM

## 2022-05-10 ENCOUNTER — Encounter: Payer: Medicare Other | Admitting: Nephrology

## 2022-06-10 ENCOUNTER — Encounter: Payer: Medicare Other | Admitting: Nephrology

## 2022-06-10 DIAGNOSIS — Z992 Dependence on renal dialysis: Secondary | ICD-10-CM

## 2022-06-10 DIAGNOSIS — N186 End stage renal disease: Secondary | ICD-10-CM

## 2022-07-06 LAB — UNMAPPED LAB RESULTS
Hematocrit (HT): 43 % (ref 40–52)
Hemoglobin (HGB) (HT): 13.2 g/dL (ref 13.0–17.5)
MCHC (HT): 30.9 g/dL — ABNORMAL LOW (ref 32.0–36.0)
MCV (HT): 81.2 fL (ref 81.0–99.0)
Mean Corpuscular Hemoglobin (MCH) (HT): 25.1 pg — ABNORMAL LOW (ref 26.0–34.0)
Platelets (HT): 377 10 3/uL (ref 150–450)
RBC (HT): 5.26 10 6/uL (ref 4.20–5.90)
RDW (HT): 18.5 % — ABNORMAL HIGH (ref 11.5–15.0)
WBC (HT): 8.3 10 3/uL (ref 4.0–10.8)

## 2022-07-10 ENCOUNTER — Encounter: Payer: Medicare Other | Admitting: Nephrology

## 2022-07-10 DIAGNOSIS — Z992 Dependence on renal dialysis: Secondary | ICD-10-CM

## 2022-07-10 DIAGNOSIS — N186 End stage renal disease: Secondary | ICD-10-CM

## 2022-07-26 ENCOUNTER — Other Ambulatory Visit: Payer: Self-pay | Admitting: Family

## 2022-07-26 MED ORDER — MAGNESIUM-OXIDE 400 (241.3 MG) MG PO TABS *WRAPPED*
400.0000 mg | ORAL_TABLET | ORAL | 6 refills | Status: DC
Start: 2022-07-26 — End: 2022-08-14

## 2022-08-10 ENCOUNTER — Encounter: Payer: Medicare Other | Admitting: Nephrology

## 2022-08-10 DIAGNOSIS — N186 End stage renal disease: Secondary | ICD-10-CM

## 2022-08-10 DIAGNOSIS — Z992 Dependence on renal dialysis: Secondary | ICD-10-CM

## 2022-08-14 ENCOUNTER — Other Ambulatory Visit: Payer: Self-pay | Admitting: Family

## 2022-08-14 MED ORDER — MAGNESIUM-OXIDE 400 (241.3 MG) MG PO TABS *WRAPPED*
400.0000 mg | ORAL_TABLET | Freq: Every day | ORAL | 6 refills | Status: DC
Start: 2022-08-14 — End: 2022-11-15

## 2022-09-09 DIAGNOSIS — Z992 Dependence on renal dialysis: Secondary | ICD-10-CM

## 2022-09-09 DIAGNOSIS — N186 End stage renal disease: Secondary | ICD-10-CM

## 2022-10-10 DIAGNOSIS — Z992 Dependence on renal dialysis: Secondary | ICD-10-CM

## 2022-10-10 DIAGNOSIS — N186 End stage renal disease: Secondary | ICD-10-CM

## 2022-10-15 NOTE — Progress Notes (Signed)
 [facility] [place]   History   Patient presents with left lower extremity/calf pain x 5-7 days.  No known injury.  Pain is gradually increased since onset.  Pain is 6/10, worse with walking. Admits to an associated pressure sensation.  States the pain was so bad at work earlier that he called the ambulance.  He discussed with the paramedics what his options were and then decided to come here.  He has been ambulating with crutches due to the pain.  Denies any redness, bruising, numbness, tingling, burning, loss of sensation, weakness, fever, chills, nausea, vomiting, chest pain, SOB. No h/o previous injury to affected joint.  He is concerned for possible DVT, as he does sit a lot at the time.  Reports history of hyperlipidemia and peripheral vascular disease.  Reports smoking history, a few cigarettes a day for about 5 years, but quit 20+ years ago.  Admits to recent travel to [place] about a month ago.  Denies any recent surgery/injury, hormone placement, family history of DVT.          Patient Active Problem List   Diagnosis Code   . Arteriosclerosis of artery of extremity I70.209   . Chronic hypotension I95.89   . End-stage renal disease N18.6   . Mixed hyperlipidemia E78.2   . Allergic rhinitis J30.9   . Chronic GERD K21.9   . Hemorrhoids K64.9   . History of anal fissures Z87.19   . Rectal bleeding K62.5   . Ischemic colitis K55.9   . 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.36   . Chronic midline low back pain without sciatica M54.50, G89.29   . 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 Adrenal insufficiency    . Arthritis    . Back problem     back pain   . Chest pain 10/07/2007   . Chronic kidney disease     Dialysis 3x/wk   . Dialysis patient     Tuesday/thursday/saturday,[place]   . History of blood transfusion 2003    no reactions   . Hyperlipidemia    . Hypotension     chronic   . Ischemic  colitis    . Other medical devices associated with adverse incidents     left chest quiton catheter   . Pain in extremity 12/29/2012   . Peripheral vascular disease     neuropathy in feet   . Snoring    . Syncope and collapse     Negative ischemic workup and normal LVEF   . Syncope and collapse 12/26/2011       Past Surgical History:   Procedure Laterality Date   . AV fistula      Failed (3 on left, 1 on right)   . COLONOSCOPY N/A 02/27/2017    Procedure: COLONOSCOPY;;  Surgeon: Dytoc, Vinnie Level, MD;  Location: [facility];  Laterality: N/A   . COLONOSCOPY N/A 02/27/2017    Procedure: COLONOSCOPY BIOPSY;;  Surgeon: Dolores Frame Vinnie Level, MD;  Location: [facility];  Laterality: N/A   . INGUINAL HERNIA REPAIR Right    . PERITONEAL CATHETER INSERTION  2000    removed same year due to infection        Family History   Problem Relation Name Age of Onset   . Heart disease Father     . Cancer Sister     .  Scleroderma Daughter     . HIV Brother     . Lung cancer Brother          Social History     Tobacco Use   . Smoking status: Former     Current packs/day: 0.00     Types: Cigarettes     Start date: 7     Quit date: 1996     Years since quitting: 28.6     Passive exposure: Never   . Smokeless tobacco: Never   . Tobacco comments:     2 cigarettes / day.  Quit in 1996   Substance Use Topics   . Alcohol use: Not Currently     Comment: monthly   . Drug use: No     Comment: Stopped using marijuana in 1999          Review of Systems   Constitutional: Negative.    HENT: Negative.     Eyes: Negative.    Respiratory: Negative.     Cardiovascular: Negative.    Gastrointestinal: Negative.    Endocrine: Negative.    Genitourinary: Negative.    Musculoskeletal:         Lower extremity swelling/pain   Skin: Negative.    Allergic/Immunologic: Negative.    Neurological: Negative.    Hematological: Negative.    Psychiatric/Behavioral: Negative.            Physical Exam     Vitals:    10/15/22 1907   BP: 111/68   Pulse:  60   Resp: 16   Temp: 36.2 C (97.1 F)   TempSrc: Temporal   SpO2: 95%         Physical Exam  Vitals and nursing note reviewed.   Constitutional:       Appearance: Normal appearance.   HENT:      Head: Normocephalic and atraumatic.      Right Ear: External ear normal.      Left Ear: External ear normal.      Nose: Nose normal.      Mouth/Throat:      Mouth: Mucous membranes are moist.   Eyes:      Extraocular Movements: Extraocular movements intact.      Pupils: Pupils are equal, round, and reactive to light.   Cardiovascular:      Rate and Rhythm: Normal rate and regular rhythm.      Pulses: Normal pulses.      Heart sounds: Normal heart sounds.   Pulmonary:      Effort: Pulmonary effort is normal.      Breath sounds: Normal breath sounds.   Musculoskeletal:         General: Normal range of motion.      Cervical back: Normal range of motion.      Right lower leg: Normal.      Left lower leg: Swelling and tenderness present. No bony tenderness.      Comments: Left calf with minimal tenderness.  Left calf circumference 39 cm, right calf circumference 38.5.  Positive Homans' sign.  Full sensation.   Skin:     General: Skin is warm.      Capillary Refill: Capillary refill takes less than 2 seconds.   Neurological:      General: No focal deficit present.      Mental Status: He is alert and oriented to person, place, and time.   Psychiatric:         Mood and Affect: Mood normal.  Behavior: Behavior normal.           ED Procedures   Procedures     No results found for this visit on 10/15/22.     ED Course   ED MDM:      Differential Diagnosis:     Differential diagnosis includes:  DVT, muscle strain, sprain, compartment syndrome    ED Course:     ED course details:  Physical exam remarkable for left calf with minimal tenderness.  Left calf circumference 39 cm, right calf circumference 38.5.  Positive Homans' sign.  Full sensation. I have concern given the degree of pain and increasing severity of pain/pressure.  Cannot rule out DVT or other emergent cause of symptoms in this facility. Patient transferred to [facility] ED via private vehicle for further evaluation and management. Patient expresses understanding and agrees with plan. Transfer center notified.    Discussion with independent historian(s):     Independent historian(s) utilized:  Patient    Disposition:       Transfer: patient transferred        Korede was seen today for leg swelling.    Diagnoses and all orders for this visit:    Pain and swelling of left lower extremity         ED [facility], [place]  10/15/22

## 2022-10-15 NOTE — Nursing Note (Signed)
 Pt c/o left leg swelling and pain, starting 5-7 days ago, worse today, now with difficulty ambulating. Is HD pt T.R.S. Denies missed treatments. No SOB/CP.

## 2022-10-16 NOTE — ED Notes (Addendum)
Pt LWOT.  NP aware.  Pt had to go to dialysis

## 2022-11-10 DIAGNOSIS — Z992 Dependence on renal dialysis: Secondary | ICD-10-CM

## 2022-11-10 DIAGNOSIS — N186 End stage renal disease: Secondary | ICD-10-CM

## 2022-11-15 ENCOUNTER — Other Ambulatory Visit: Payer: Self-pay | Admitting: Nephrology

## 2022-11-15 MED ORDER — MAGNESIUM-OXIDE 400 (241.3 MG) MG PO TABS *WRAPPED*
400.0000 mg | ORAL_TABLET | Freq: Every day | ORAL | 6 refills | Status: DC
Start: 2022-11-15 — End: 2023-01-08

## 2022-12-10 DIAGNOSIS — Z992 Dependence on renal dialysis: Secondary | ICD-10-CM

## 2022-12-10 DIAGNOSIS — N186 End stage renal disease: Secondary | ICD-10-CM

## 2022-12-10 NOTE — Telephone Encounter (Signed)
Prescription refill request:  Medications verified.  Allergies confirmed.      Next appointment:  08/16/2023    Last FLP: 07/06/2022

## 2022-12-18 ENCOUNTER — Telehealth: Payer: Self-pay | Admitting: Nephrology

## 2022-12-18 ENCOUNTER — Other Ambulatory Visit: Payer: Self-pay | Admitting: Nephrology

## 2022-12-18 DIAGNOSIS — N281 Cyst of kidney, acquired: Secondary | ICD-10-CM

## 2022-12-18 NOTE — Telephone Encounter (Signed)
Called patient. Reviewed indications for renal ultrasound due to up-trending Hb. He needs to have ultrasound on Mon/Wed/Fri and prefers afternoon.    Shane Kowalewski Lynnell Dike, MD 12/18/2022 at 4:56 PM

## 2022-12-18 NOTE — Telephone Encounter (Signed)
Spoke to patient and let him know that a renal ultrasound was schedule for him on 01/24/2023 at 1:00 pm at [address]    Patient states he has dialysis on T,Th, Sat and can not go to the ultrasound on one of those days.    Patient would like to talk to Dr. Vincente Poli before he agrees to the ultrasound.

## 2022-12-19 ENCOUNTER — Other Ambulatory Visit: Payer: Self-pay

## 2022-12-19 NOTE — Telephone Encounter (Signed)
Writer contacted patient and left a message with patient new ultrasound appointment date and time.    If patient calls back please transfer to [facility].

## 2023-01-08 ENCOUNTER — Other Ambulatory Visit: Payer: Self-pay | Admitting: Nephrology

## 2023-01-24 ENCOUNTER — Other Ambulatory Visit: Payer: Medicare Other

## 2023-01-25 ENCOUNTER — Other Ambulatory Visit: Payer: Self-pay

## 2023-01-25 ENCOUNTER — Ambulatory Visit
Admission: RE | Admit: 2023-01-25 | Discharge: 2023-01-25 | Disposition: A | Discharge status: Home / Self Care | Payer: Medicare Other | Source: Ambulatory Visit | Attending: Nephrology | Admitting: Nephrology

## 2023-01-25 DIAGNOSIS — Z992 Dependence on renal dialysis: Secondary | ICD-10-CM | POA: Insufficient documentation

## 2023-01-25 DIAGNOSIS — N281 Cyst of kidney, acquired: Secondary | ICD-10-CM | POA: Insufficient documentation

## 2023-01-25 DIAGNOSIS — N186 End stage renal disease: Secondary | ICD-10-CM | POA: Insufficient documentation

## 2023-02-09 ENCOUNTER — Encounter: Payer: Medicare Other | Admitting: Nephrology

## 2023-02-09 DIAGNOSIS — I10 Essential (primary) hypertension: Secondary | ICD-10-CM

## 2023-02-09 DIAGNOSIS — N186 End stage renal disease: Secondary | ICD-10-CM

## 2023-03-12 ENCOUNTER — Encounter: Payer: Medicare Other | Admitting: Nephrology

## 2023-03-12 DIAGNOSIS — I120 Hypertensive chronic kidney disease with stage 5 chronic kidney disease or end stage renal disease: Secondary | ICD-10-CM

## 2023-03-12 DIAGNOSIS — N186 End stage renal disease: Secondary | ICD-10-CM

## 2023-04-12 ENCOUNTER — Encounter: Payer: Medicare Other | Admitting: Nephrology

## 2023-04-12 DIAGNOSIS — N186 End stage renal disease: Secondary | ICD-10-CM

## 2023-04-12 DIAGNOSIS — I120 Hypertensive chronic kidney disease with stage 5 chronic kidney disease or end stage renal disease: Secondary | ICD-10-CM

## 2023-05-10 ENCOUNTER — Encounter: Admitting: Nephrology

## 2023-05-10 DIAGNOSIS — I120 Hypertensive chronic kidney disease with stage 5 chronic kidney disease or end stage renal disease: Secondary | ICD-10-CM

## 2023-05-10 DIAGNOSIS — N186 End stage renal disease: Secondary | ICD-10-CM

## 2023-05-30 ENCOUNTER — Other Ambulatory Visit: Payer: Self-pay | Admitting: Nephrology

## 2023-05-30 MED ORDER — MAGNESIUM-OXIDE 400 (241.3 MG) MG PO TABS *WRAPPED*
400.0000 mg | ORAL_TABLET | Freq: Every day | ORAL | 3 refills | Status: AC
Start: 2023-05-30 — End: ?

## 2023-06-10 ENCOUNTER — Encounter: Admitting: Nephrology

## 2023-06-17 ENCOUNTER — Other Ambulatory Visit: Payer: Self-pay

## 2023-06-17 ENCOUNTER — Encounter: Payer: Self-pay | Admitting: Urgent Care

## 2023-06-17 ENCOUNTER — Ambulatory Visit: Attending: Urgent Care | Admitting: Urgent Care

## 2023-06-17 VITALS — BP 112/60 | HR 66 | Temp 96.8°F | Resp 18

## 2023-06-17 DIAGNOSIS — Z789 Other specified health status: Secondary | ICD-10-CM | POA: Insufficient documentation

## 2023-06-17 DIAGNOSIS — M79602 Pain in left arm: Secondary | ICD-10-CM | POA: Insufficient documentation

## 2023-06-17 LAB — EKG 12-LEAD
P: 28 deg
PR: 162 ms
QRS: 6 deg
QRSD: 94 ms
QT: 406 ms
QTc: 421 ms
Rate: 64 {beats}/min
T: 28 deg

## 2023-06-17 NOTE — [facility] Provider Note (Unsigned)
 History     Chief Complaint   Patient presents with    Shoulder Pain     Left shoulder pain that radiates down the arm started about 2 weeks ago and progressively worsened. Denies numbness or tingling in fingers, and Denies Limited ROM, and Injury to shoulder. States able to pick things up with arm. Tried tylenol with no relief.      63 year old African-American male with a significant history of ESRD on HD, CHF, hypotension, hypercholesterolemia, arthritis, lumbar radiculopathy, and obesity presents to the [facility] this evening with concerns about persistent, worsening, intermittent left shoulder and arm pain this been going on for the past 3 weeks.  Patient denies any injury or activity that he is aware of that could have caused this.  Patient states that he initially had pain in the right upper extremity that he thought was related to a COVID-19 vaccination.  However, this has significantly improved.  Most of his pain is at the superior aspect of the left shoulder.  He denies any chest pain, shortness of breath, palpitations of the heart.  There has been no nausea, emesis, or diaphoresis.  He has no swelling or discoloration of the left upper extremity or shoulder.  He denies any cervical neck pain at this time, but there is some pain of the left side of his neck.  He states that there is no radiation of the pain, per se.  However, he states that the pain will move from the shoulder then to the upper arm, then the forearm.  He states that worst the pain was last night.  He tried a fine different movements to make it better without success.  There is no numbness or weakness.  He has full range of motion of the left upper extremity.  There is no swelling, erythema, or inflammation of the left arm fistula or the left chest catheter for dialysis.  At present, the patient has essentially no pain.  He has no tenderness of the areas, either.      History provided by:  Patient  Language interpreter used: No     Shoulder Pain  Associated symptoms: neck pain    Associated symptoms: no fatigue and no fever        Medical/Surgical/Family History     Past Medical History:   Diagnosis Date    Arthritis     CHF (congestive heart failure)     Colon polyp     Dialysis patient     ESRD (end stage renal disease)     GERD (gastroesophageal reflux disease)     Hypercholesterolemia     Hypotension     Ischemic colitis     Thyroid disease     hypo        Patient Active Problem List   Diagnosis Code    ESRD (end stage renal disease) on dialysis N18.6, Z99.2    HLD (hyperlipidemia) E78.5    Hypotension I95.9    Ischemic colitis K55.9            Past Surgical History:   Procedure Laterality Date    DIALYSIS FISTULA CREATION Bilateral     4 fistulas have failed- ? d/t low BP and small veins    HERNIA REPAIR      Right inguinal hernia repair       Family History   Problem Relation Age of Onset    Diabetes Mother     Asthma Mother     Coronary  artery disease Father           Social History[1]  Living Situation       Questions Responses    Patient lives with Spouse    Homeless     Caregiver for other family member     External Services     Employment Employed    Domestic Violence Risk                   Review of Systems   Review of Systems   Constitutional:  Negative for activity change, chills, diaphoresis, fatigue and fever.   HENT:  Negative for congestion, ear pain, postnasal drip, rhinorrhea, sinus pressure, sinus pain, sneezing, sore throat, trouble swallowing and voice change.    Eyes:  Negative for discharge, redness and visual disturbance.   Respiratory:  Negative for apnea, cough, chest tightness, shortness of breath and wheezing.    Cardiovascular:  Negative for chest pain, palpitations and leg swelling.   Gastrointestinal:  Negative for abdominal pain, nausea and vomiting.   Musculoskeletal:  Positive for myalgias and neck pain. Negative for arthralgias, gait problem, joint swelling and neck stiffness.   Skin:  Negative for color  change and rash.   Allergic/Immunologic: Negative for environmental allergies.   Neurological:  Negative for dizziness, weakness, light-headedness, numbness and headaches.   Hematological:  Negative for adenopathy.       Physical Exam   Vitals     First Recorded BP: 112/60, Resp: 18, Temp: 36 C (96.8 F), Temp src: TEMPORAL Oxygen Therapy SpO2: 94 %, Heart Rate: 66, (06/17/23 1922)  .      Physical Exam  Vitals and nursing note reviewed.   Constitutional:       General: He is not in acute distress.     Appearance: Normal appearance. He is not ill-appearing, toxic-appearing or diaphoretic.   HENT:      Right Ear: Hearing, tympanic membrane, ear canal and external ear normal. No drainage, swelling or tenderness. No middle ear effusion. No foreign body. Tympanic membrane is not injected, erythematous, retracted or bulging.      Left Ear: Hearing, tympanic membrane, ear canal and external ear normal. No drainage, swelling or tenderness.  No middle ear effusion. No foreign body. Tympanic membrane is not injected, erythematous, retracted or bulging.      Nose: No congestion or rhinorrhea.      Mouth/Throat:      Mouth: Mucous membranes are moist. No oral lesions.      Pharynx: No pharyngeal swelling, oropharyngeal exudate, posterior oropharyngeal erythema or uvula swelling.      Tonsils: No tonsillar exudate or tonsillar abscesses.   Eyes:      General: Lids are normal. No scleral icterus.     Conjunctiva/sclera: Conjunctivae normal.      Right eye: Right conjunctiva is not injected.      Left eye: Left conjunctiva is not injected.   Cardiovascular:      Rate and Rhythm: Normal rate and regular rhythm.      Pulses: Normal pulses.      Heart sounds: Normal heart sounds, S1 normal and S2 normal. No murmur heard.     Comments: Both extremities are equal in temperature and warm to the touch.  There is delayed refill of perfusion of both upper extremities distally.  There is no edema or swelling of the left upper extremity  as compared to the right.  There is no pitting edema.  Pulmonary:  Effort: Pulmonary effort is normal. No respiratory distress.      Breath sounds: No wheezing, rhonchi or rales.   Musculoskeletal:         General: No swelling, tenderness or deformity.      Cervical back: Normal range of motion. No rigidity or tenderness.      Right lower leg: No edema.      Left lower leg: No edema.      Comments: Visual inspection of the left upper extremity reveals no obvious deformity, swelling, erythema, or ecchymosis this out of normal.  There is no tenderness whatsoever.  He has full range of motion of the left upper extremity.  No tenderness of the spinous processes of the lumbar spine or the paraspinal musculature.  He has excellent strength.   Lymphadenopathy:      Head:      Right side of head: No submental, submandibular, tonsillar, preauricular, posterior auricular or occipital adenopathy.      Left side of head: No submental, submandibular, tonsillar, preauricular, posterior auricular or occipital adenopathy.      Cervical: No cervical adenopathy.      Right cervical: No superficial, deep or posterior cervical adenopathy.     Left cervical: No superficial, deep or posterior cervical adenopathy.   Skin:     Coloration: Skin is not jaundiced or pale.      Findings: No bruising, erythema or rash.   Neurological:      Mental Status: He is alert.      Motor: No weakness.          Medical Decision Making   Medical Decision Making  Assessment:      63 year old African-American male with a significant history of ESRD on HD, CHF, hypotension, hypercholesterolemia, arthritis, lumbar radiculopathy, and obesity presents to the [facility] this evening with concerns about persistent, worsening, intermittent left shoulder and arm pain this been going on for the past 3 weeks.  Patient denies any injury or activity that he is aware of that could have caused this.  Patient states that he initially had pain in the right upper  extremity that he thought was related to a COVID-19 vaccination.  However, this has significantly improved.  Most of his pain is at the superior aspect of the left shoulder.  He denies any chest pain, shortness of breath, palpitations of the heart.  There has been no nausea, emesis, or diaphoresis.  He has no swelling or discoloration of the left upper extremity or shoulder.  He denies any cervical neck pain at this time, but there is some pain of the left side of his neck.  He states that there is no radiation of the pain, per se.  However, he states that the pain will move from the shoulder then to the upper arm, then the forearm.  He states that worst the pain was last night.  He tried a fine different movements to make it better without success.  There is no numbness or weakness.  He has full range of motion of the left upper extremity.  There is no swelling, erythema, or inflammation of the left arm fistula or the left chest catheter for dialysis.  At present, the patient has essentially no pain.  He has no tenderness of the areas, either.  Vitals normal.  Physical examination reveals an adult male in no acute distress and does not appear in any pain.  He is not diaphoretic.  Visual inspection of the left upper extremity  reveals no obvious deformity, swelling, erythema, or ecchymosis.  He has no tenderness of the shoulder or the rest of the left upper extremity.  He has full range of motion.  There is no heat generated on palpation.  He has no tenderness of the spinous processes of the cervical spine.  He has full range of motion of the neck.  He has left upper extremity fistula and catheter reveals no focal erythema, induration, swelling, or tenderness.  Patient does have circulation equally and bilaterally of the distal extremities and the hands are warm to touch.  He has no pitting edema of the left upper extremity and there is no appreciable difference from the right.  Cardiopulmonary examination is  normal.      Differential diagnosis:      Cervical radiculopathy  Spondylosis of the cervical spine with impingement  Left upper extremity strain  DVT  Mononeuropathy  Thoracic outlet obstruction        Plan and Results:      EKG reveals no ischemic changes or arrhythmias  No indication for emergent management at this time  Encourage quick follow-up with PCP this week  May recommend left upper extremity ultrasound  Continue Tylenol 3 times daily as needed  NSAIDs contraindicated  Patient declines other pain medications    Encounter orders    Orders Placed This Encounter      EKG 12 lead        EKG Interpretation:      Normal sinus rhythm and No ischemic changes      Diagnosis and Disposition:     Had a long discussion with patient regarding his presentation here today.  We both agreed that he would rather follow-up with his PCP and obtain further testing or imaging on an outpatient basis.  However, if he develops any chest pain, shortness of breath, or intractable pain, he will follow-up in the emergency room.        Final Diagnosis    ICD-10-CM ICD-9-CM   1. Pain of left upper extremity  M79.602 729.5         Daphnee Preiss [place], MD                  [1]   Social History  Tobacco Use    Smoking status: Former     Packs/day: 0.30     Years: 15.00     Additional pack years: 0.00     Total pack years: 4.50     Types: Cigarettes     Quit date: 08/02/1992     Years since quitting: 30.8    Smokeless tobacco: Never   Substance Use Topics    Alcohol use: No    Drug use: No

## 2023-06-17 NOTE — Patient Instructions (Signed)
 Fortunately, your EKG revealed no heart problems.  I am less inclined to think that this is related to a clot, but this could be a consideration.  I am less inclined to think that this is a muscle strain as well.  This may be related to impingement of the neck.  Ensure that you stay well-hydrated.  Continue Tylenol 3 times a day as needed.  I have avoided a muscle relaxant and any narcotics for pain as discussed.  However, if you do change your mind, let us know.  I do recommend you follow-up with your primary care physician as soon as possible.  If you have any chest pain or shortness of breath or worsening pain, follow-up in the emergency room.

## 2023-07-10 ENCOUNTER — Encounter

## 2023-07-10 DIAGNOSIS — N186 End stage renal disease: Secondary | ICD-10-CM

## 2023-07-10 DIAGNOSIS — I129 Hypertensive chronic kidney disease with stage 1 through stage 4 chronic kidney disease, or unspecified chronic kidney disease: Secondary | ICD-10-CM

## 2023-07-11 ENCOUNTER — Other Ambulatory Visit: Payer: Self-pay | Admitting: Nephrology

## 2023-07-11 MED ORDER — MIDODRINE HCL 5 MG PO TABS *I*
15.0000 mg | ORAL_TABLET | Freq: Three times a day (TID) | ORAL | 5 refills | Status: DC
Start: 2023-07-11 — End: 2023-10-18

## 2023-08-10 DIAGNOSIS — N186 End stage renal disease: Secondary | ICD-10-CM

## 2023-08-10 DIAGNOSIS — I129 Hypertensive chronic kidney disease with stage 1 through stage 4 chronic kidney disease, or unspecified chronic kidney disease: Secondary | ICD-10-CM

## 2023-09-09 DIAGNOSIS — I129 Hypertensive chronic kidney disease with stage 1 through stage 4 chronic kidney disease, or unspecified chronic kidney disease: Secondary | ICD-10-CM

## 2023-09-09 DIAGNOSIS — N186 End stage renal disease: Secondary | ICD-10-CM

## 2023-09-16 LAB — UNMAPPED LAB RESULTS
Basophil # (HT): 0 10 3/uL (ref 0.0–0.2)
Basophil % (HT): 0 % (ref 0–2)
Eosinophil # (HT): 0.2 10 3/uL (ref 0.0–0.5)
Eosinophil % (HT): 2 % (ref 0–7)
Hematocrit (HT): 35 % — ABNORMAL LOW (ref 40–52)
Hemoglobin (HGB) (HT): 10.7 g/dL — ABNORMAL LOW (ref 13.0–17.5)
Lymphocyte # (HT): 0.9 10 3/uL (ref 0.9–3.8)
Lymphocyte % (HT): 9 % — ABNORMAL LOW (ref 17–44)
MCHC (HT): 30.5 g/dL — ABNORMAL LOW (ref 32.0–36.0)
MCV (HT): 82 fL (ref 81–99)
Mean Corpuscular Hemoglobin (MCH) (HT): 25.1 pg — ABNORMAL LOW (ref 26.0–34.0)
Monocyte # (HT): 1 10 3/uL (ref 0.2–1.0)
Monocyte % (HT): 10 % (ref 4–15)
Neutrophil # (HT): 7.1 10 3/uL (ref 1.5–7.7)
Platelets (HT): 526 10 3/uL — ABNORMAL HIGH (ref 150–450)
RBC (HT): 4.26 10 6/uL — ABNORMAL LOW (ref 4.60–6.20)
RDW (HT): 18.9 % — ABNORMAL HIGH (ref 11.5–15.0)
Seg Neut % (HT): 77 % — ABNORMAL HIGH (ref 43–75)
WBC (HT): 9.2 10 3/uL (ref 4.0–10.0)

## 2023-09-16 NOTE — H&P (Signed)
 -------------------------------------------------------------------------------  Attestation signed by Mylo Candyce SAILOR, MD at 09/17/2023 10:50 AM  Patient was seen by me on 09/17/2023. Please see my note.    -------------------------------------------------------------------------------      MEDICINE ADMISSION NOTE   PATIENT'S NAME:Shane Pearson   MRN: [id number] DOB: [birth year] AGE: 63 y.o.   Date: 09/16/2023 Date of Admission: 09/16/2023   Admitting Provider Primary Care Physician   Thida, Candyce SAILOR, MD Juliene Ned, MD       CHIEF COMPLAINTS:     Chest Pain      HISTORY OF PRESENT ILLNESS:   SILVANO GAROFANO is a 63 y.o. male with a PMH significant for ESRD on HD, HLD, chronic hypotension, ischemic colitis, PVD who presents to [facility] with a day of chest pain. The patient states he has been having ongoing issues with his joints for several weeks. He began with pain in the R shoulder which resolved over a few weeks. Then he began with pain in the L shoulder and arm. The pain is random and intermittent. It comes on randomly and then resolves. It never last more than a few hours. He has not been able to note any exacerbating symptoms. The pain starts in the L shoulder and travels down the arm into the hand. There is also a sensation of fullness/swelling in the distal arm. The pain extends all the way to the hand. He works at a [employer] for work but doesn't believe this exacerbates his symptoms. Yesterday evening he noted pain in the L side of the chest. This pain was different and constant. It was like a pressure in the chest and there was associated dyspnea with exertion which is also new. The pain continued into this AM. He called his PCP who instructed him to come to the ED. He denies hx of heart disease. In addition he notes that will have random areas of pain, sometimes in the leg and sometimes in the abdomen. This is unpredictable and random. There is also a reduction in appetite stating he his eating 50% less food and drink  then before and has lost a few pounds. He also denies distinct injury to the shoulder or arm. He has recently started following with an orthopedist. Initially he was prescribed a prednisolone course without significant improvement. He follows with cardiology for chronic hypotension, on fludrocortisone  and midodrine . Also on HD T/T/S, last dialyzed on Saturday.     REVIEW OF SYSTEMS:   Review of Systems   Constitutional:  Positive for weight loss. Negative for chills, fever and malaise/fatigue.   HENT:  Negative for congestion and sore throat.    Eyes:  Negative for blurred vision and double vision.   Respiratory:  Negative for cough and shortness of breath.    Cardiovascular:  Positive for chest pain. Negative for palpitations and leg swelling.   Gastrointestinal:  Negative for abdominal pain, constipation, diarrhea, nausea and vomiting.   Genitourinary:  Negative for dysuria.   Musculoskeletal:  Positive for joint pain (L shoulder) and myalgias. Negative for falls.   Skin:  Negative for rash.   Neurological:  Negative for dizziness, focal weakness and headaches.   Psychiatric/Behavioral:  Negative for substance abuse. The patient is not nervous/anxious.        PAST MEDICAL HISTORY:   Past Medical History:   Diagnosis Date   . Adrenal insufficiency (CMS HCC Code)    . Arthritis    . Back problem     back pain   . Chest pain  10/07/2007   . Chronic kidney disease     Dialysis 3x/wk   . Dialysis patient (CMS [facility] Code)     Tuesday/thursday/saturday,[place]   . History of blood transfusion 2003    no reactions   . Hyperlipidemia    . Hypotension     chronic   . Ischemic colitis (CMS HCC Code)    . Other medical devices associated with adverse incidents     left chest quiton catheter   . Pain in extremity 12/29/2012   . Peripheral vascular disease (CMS HCC Code)     neuropathy in feet   . Snoring    . Syncope and collapse     Negative ischemic workup and normal LVEF   . Syncope and collapse 12/26/2011         PAST SURGICAL HISTORY:   Past Surgical History:   Procedure Laterality Date   . AV fistula      Failed (3 on left, 1 on right)   . COLONOSCOPY N/A 02/27/2017    Procedure: COLONOSCOPY;;  Surgeon: Dytoc, Fairy Marcus Cheadle, MD;  Location: [facility];  Laterality: N/A   . COLONOSCOPY N/A 02/27/2017    Procedure: COLONOSCOPY BIOPSY;;  Surgeon: Jyl Fairy Marcus Cheadle, MD;  Location: [facility];  Laterality: N/A   . INGUINAL HERNIA REPAIR Right    . PERITONEAL CATHETER INSERTION  2000    removed same year due to infection        SOCIAL HISTORY:   Social History     Socioeconomic History   . Marital status: Married     Spouse name: Not on file   . Number of children: Not on file   . Years of education: Not on file   . Highest education level: Not on file   Occupational History   . Occupation: Airline pilot   Tobacco Use   . Smoking status: Former     Current packs/day: 0.00     Types: Cigarettes     Start date: 61     Quit date: 1996     Years since quitting: 29.5     Passive exposure: Never   . Smokeless tobacco: Never   . Tobacco comments:     2 cigarettes / day.  Quit in 1996   Substance and Sexual Activity   . Alcohol use: Not Currently     Comment: monthly   . Drug use: No     Comment: Stopped using marijuana in 1999   . Sexual activity: Not on file   Other Topics Concern   . Not on file   Social History Narrative    Resides with wife.  Independent.  Works as an Airline pilot.       Social Drivers of Health     Financial Resource Strain: Low Risk  (10/01/2022)    Overall Financial Resource Strain (CARDIA)    . Difficulty of Paying Living Expenses: Not hard at all   Food Insecurity: No Food Insecurity (10/01/2022)    Hunger Vital Sign    . Worried About Programme researcher, broadcasting/film/video in the Last Year: Never Shane    . Ran Out of Food in the Last Year: Never Shane   Transportation Needs: No Transportation Needs (10/01/2022)    PRAPARE - Transportation    . Lack of Transportation (Medical): No    . Lack of Transportation  (Non-Medical): No   Physical Activity: Unknown (10/01/2022)    Exercise Vital Sign    .  Days of Exercise per Week: 0 days    . Minutes of Exercise per Session: Not on file   Stress: No Stress Concern Present (10/01/2022)    [employer] of Occupational Health - Occupational Stress Questionnaire    . Feeling of Stress : Not at all   Social Connections: Moderately Integrated (10/01/2022)    Social Connection and Isolation Panel [NHANES]    . Frequency of Communication with Friends and Family: Once a week    . Frequency of Social Gatherings with Friends and Family: More than three times a week    . Attends Religious Services: More than 4 times per year    . Active Member of Clubs or Organizations: No    . Attends Banker Meetings: Not on file    . Marital Status: Married   Catering manager Violence: Not At Risk (06/17/2023)    Received from [facility]    Intimate Partner Violence    . Is anyone physically, emotionally, or financially hurting you?: No   Housing Stability: Low Risk  (10/01/2022)    Housing Stability Vital Sign    . Unable to Pay for Housing in the Last Year: No    . Number of Times Moved in the Last Year: 0    . Homeless in the Last Year: No        FAMILY HISTORY:   Family History   Problem Relation Name Age of Onset   . Heart disease Father     . Cancer Sister     . Scleroderma Daughter     . HIV Brother     . Lung cancer Brother          ALLERGIES:   Allergies   Allergen Reactions   . Amoxicillin Rash   . Penicillins Anaphylaxis and Unknown   . Baclofen Other (See Comments)     Confusion  confusion   . Coconut Rash and Unknown       HOME MEDICATIONS:   PTA med list:  acetaminophen  (TYLENOL  ARTHRITIS ORAL), Take  by mouth at bedtime. Pt takes 2 tabs 3 times daily  aspirin , Take 1 tablet by mouth daily. Indications: instructed to continue  atorvastatin , TAKE 1 TABLET BY MOUTH EVERY DAY  calcium  carbonate (CALCIUM  500 ORAL), Take  by mouth 2 (two) times a day.  SENSIPAR , Take 1 tablet by  mouth daily.  fludrocortisone , TAKE 1 TABLET BY MOUTH THREE TIMES A DAY  fluticasone  propionate, 2 sprays each nostril daily  folic acid /vit B complex and C (RENA-VITE ORAL), Take 1 tablet by mouth every morning.  gabapentin, Take 1 capsule by mouth at bedtime.  magnesium  oxide, TAKE 1 TABLET (400 MG TOTAL) BY MOUTH EVERY OTHER DAY FOR DISORDER WITH LOW MAGNESIUM  LEVELS.  methylPREDNISolone, follow package directions  midodrine , TAKE 1 TABLET BY MOUTH THREE TIMES A DAY  omeprazole , Take 1 capsule by mouth daily.     Home meds:   Prior to Admission medications    Medication Sig Start Date End Date Taking? Authorizing Provider   acetaminophen  (TYLENOL  ARTHRITIS ORAL) Take  by mouth at bedtime. Pt takes 2 tabs 3 times daily    [provider]   aspirin  81 MG Oral EC tablet Take 1 tablet by mouth daily. Indications: instructed to continue    [provider]   ATORVASTATIN  20 MG Oral tablet TAKE 1 TABLET BY MOUTH EVERY DAY 01/24/23   Pecolia Bernardino ORN, MD   calcium  carbonate (CALCIUM  500 ORAL) Take  by mouth 2 (two) times a day.    [provider]   cinacalcet  (SENSIPAR ) 30 MG tablet Take 1 tablet by mouth daily.    [provider]   FLUDROCORTISONE  0.1 mg Oral tablet TAKE 1 TABLET BY MOUTH THREE TIMES A DAY 12/01/22   Pecolia Bernardino ORN, MD   fluticasone  50 mcg/actuation Nasl nasal spray 2 sprays each nostril daily    [provider]   folic acid rufina B complex and C (RENA-VITE ORAL) Take 1 tablet by mouth every morning.    [provider]   gabapentin 300 MG Oral capsule Take 1 capsule by mouth at bedtime. 07/03/23   Gari Herring, PA-C   magnesium  oxide 400 mg Oral tablet TAKE 1 TABLET (400 MG TOTAL) BY MOUTH EVERY OTHER DAY FOR DISORDER WITH LOW MAGNESIUM  LEVELS. 10/09/22   [provider]   methylPREDNISolone 4 mg Oral tablet follow package directions 08/21/23   Karnyski, Steven, MD   MIDODRINE  10 MG Oral tablet TAKE 1 TABLET BY MOUTH THREE TIMES A DAY 08/21/23    Juliene Ned, MD   omeprazole  40 MG Oral capsule Take 1 capsule by mouth daily.    [provider]     ATC medications:  acetaminophen , 1,000 mg, TID  aspirin , 81 mg, Daily  atorvastatin , 20 mg, DAILY  cinacalcet , 30 mg, Daily  fludrocortisone , 100 mcg, TID  lidocaine , 1 patch, Daily  magnesium  oxide, 400 mg, Daily  midodrine , 10 mg, TID  Renal Vitamin with B complex-vitamin C-Folic Acid , 1 tablet, QAM     PRN medications:  tiZANidine, 2 mg, Q6H PRN     IV medications:        PHYSICAL EXAMINATION:   Vitals:    09/16/23 1303 09/16/23 1759 09/16/23 2129   BP: 99/62 99/58 110/60   Pulse: 81 72    Resp: 14 14    Temp: 36.9 C (98.5 F) 37.2 C (98.9 F)    TempSrc: Oral Oral    SpO2: 94% 97%    Weight: 85 kg (187 lb 6.3 oz)     Height: 5' 5 (1.651 m)       Physical Exam  Vitals and nursing note reviewed.   Constitutional:       General: He is not in acute distress.     Appearance: He is well-developed. He is not diaphoretic.   HENT:      Head: Normocephalic.      Mouth/Throat:      Mouth: Mucous membranes are moist.   Eyes:      Extraocular Movements: Extraocular movements intact.   Cardiovascular:      Rate and Rhythm: Normal rate and regular rhythm.      Heart sounds: Normal heart sounds. No murmur heard.  Pulmonary:      Effort: Pulmonary effort is normal. No respiratory distress.      Breath sounds: Normal breath sounds. No wheezing.   Abdominal:      General: Bowel sounds are normal. There is no distension.      Palpations: Abdomen is soft.      Tenderness: There is no abdominal tenderness.   Musculoskeletal:         General: Normal range of motion.      Cervical back: Tenderness (TTP of the L neck paraspinal and lateral neck musculatrue. Palpable tension) present.      Right lower leg: No edema.      Left lower leg: No edema.      Comments:  Reduced AROM in the L arm due to shoulder pain   Skin:     General: Skin is warm and dry.      Findings: No rash.      Comments: L chest wall perm cath present     Neurological:      Mental Status: He is alert and oriented to person, place, and time.   Psychiatric:         Mood and Affect: Mood normal.         LABS:     Lab Results   Component Value Date/Time    WBCR 9.2 09/16/2023 01:22 PM    RBC 4.26 (L) 09/16/2023 01:22 PM    HGB 10.7 (L) 09/16/2023 01:22 PM    HCT 35 (L) 09/16/2023 01:22 PM    MCV 82 09/16/2023 01:22 PM    MCH 25.1 (L) 09/16/2023 01:22 PM    MCHC 30.5 (L) 09/16/2023 01:22 PM    BUN 48 (H) 09/16/2023 01:22 PM    CREAT 11.1 (H) 09/16/2023 01:22 PM    GLU 115 (H) 09/16/2023 01:22 PM    CA 8.7 09/16/2023 01:22 PM    NA 132 (L) 09/16/2023 01:22 PM    K 4.1 09/16/2023 01:22 PM    CL 91 (L) 09/16/2023 01:22 PM    CO2 28 09/16/2023 01:22 PM    TPRP 6.5 09/16/2023 01:22 PM    ALB 3.8 09/16/2023 01:22 PM    GLOBC 2.7 09/16/2023 01:22 PM    AST 43 (H) 09/16/2023 01:22 PM    ALT 13 09/16/2023 01:22 PM    PLTR 526 (H) 09/16/2023 01:22 PM        DIAGNOSTIC STUDIES:     CXR 09/16/23  Findings:  Cardiac and mediastinal contours are normal. Central venous catheter tip is near the cavoatrial junction. There is mild underinflation with minimal left basilar atelectasis or less likely pneumonia. No pneumothorax. Suspected tiny right  pleural effusion. No acute or suspicious osseous abnormality.     Impression:  Minimal left basilar atelectasis or less likely pneumonia. Suspected tiny right effusion.       ASSESSMENT AND PLAN:     LEROI HAQUE is a 63 y.o. male with a PMH significant for ESRD on HD, HLD, chronic hypotension, ischemic colitis, PVD who presents to [facility] with     Chest Pain  Presents with L sided chest pain. Of note has been having on going L shoulder pain   HS trops negative x 2 and EKG without evidence of ACS   CXR without significant acute diease   Seen by Dr. Pecolia of cardiology 08/16/23. No hx heart disease, follows for chronic hypotension. Stress test documented in 2009   CAD RF: Age, HLD, hx of coagulation deficit   CAD, CHF, gastritis, MSK, less likely  infectious etiology, PE   - Nuclear ST in AM   - Telemetry     L Shoulder/Arm Pain: Outpatient imaging with some degenerative changes. Takes tylenol  for pain control. Does not tolerate sedating medications well, has tried baclofen & gabapentin in the past. Is newly following with orthopedics, was given a short course of Methylprednisolone with only a few days of relief. AROM in the arm at he shoulder reduced to due pain. Pain radiates from shoulder down into hand. Some sensation of fullness but no numbness/tingling. Palpable muscle tension in the L lateral neck musculature. Potentially contributing to chest pain symptoms   - Continue tylenol  TID   - Discussed trial ling  low dose baclofen   - Outpatient ortho follow up   - CK ordered     ESRD on HD: Nephrology aware     HLD: Atorvastatin  - hold atorvastatin  with possible myalgias     Hypotension: Related to HD   - Midodrine  and fludrocortisone  TID    Dispo: From home; anticipate return to same   DVT Prophylaxis: ELOS <48 hours   EDD: 09/17/23         CODE STATUS:     Full Code          Electronically signed by:  Estefana Littles, PA-C  09/16/2023 10:06 PM    [facility] [address] [facility]  Visit time if different from filing time:

## 2023-09-17 LAB — UNMAPPED LAB RESULTS: HBV S Ag (HT): NEGATIVE

## 2023-09-27 NOTE — Telephone Encounter (Signed)
 Read note 09/23/2023, would ask pt if he is using topical diclofenac?  And he does need repeat lab work. Other than that , work up was completed and it would be  OK to rest at home with ED if it gets worse.  Please report this info to pt. Thanks [employer]

## 2023-09-28 ENCOUNTER — Inpatient Hospital Stay
Admission: EM | Admit: 2023-09-28 | Discharge: 2023-10-10 | DRG: 477 | Disposition: A | Discharge status: Home Health | Source: Ambulatory Visit | Attending: Internal Medicine | Admitting: Internal Medicine

## 2023-09-28 ENCOUNTER — Emergency Department

## 2023-09-28 ENCOUNTER — Other Ambulatory Visit: Payer: Self-pay

## 2023-09-28 DIAGNOSIS — I9589 Other hypotension: Secondary | ICD-10-CM | POA: Diagnosis present

## 2023-09-28 DIAGNOSIS — C7951 Secondary malignant neoplasm of bone: Principal | ICD-10-CM | POA: Diagnosis present

## 2023-09-28 DIAGNOSIS — E878 Other disorders of electrolyte and fluid balance, not elsewhere classified: Secondary | ICD-10-CM | POA: Diagnosis not present

## 2023-09-28 DIAGNOSIS — R627 Adult failure to thrive: Principal | ICD-10-CM

## 2023-09-28 DIAGNOSIS — E871 Hypo-osmolality and hyponatremia: Secondary | ICD-10-CM | POA: Diagnosis not present

## 2023-09-28 DIAGNOSIS — I1311 Hypertensive heart and chronic kidney disease without heart failure, with stage 5 chronic kidney disease, or end stage renal disease: Secondary | ICD-10-CM

## 2023-09-28 DIAGNOSIS — Z87891 Personal history of nicotine dependence: Secondary | ICD-10-CM

## 2023-09-28 DIAGNOSIS — H532 Diplopia: Secondary | ICD-10-CM | POA: Diagnosis present

## 2023-09-28 DIAGNOSIS — R9431 Abnormal electrocardiogram [ECG] [EKG]: Secondary | ICD-10-CM

## 2023-09-28 DIAGNOSIS — M47817 Spondylosis without myelopathy or radiculopathy, lumbosacral region: Secondary | ICD-10-CM | POA: Diagnosis present

## 2023-09-28 DIAGNOSIS — E213 Hyperparathyroidism, unspecified: Secondary | ICD-10-CM | POA: Diagnosis present

## 2023-09-28 DIAGNOSIS — I509 Heart failure, unspecified: Secondary | ICD-10-CM | POA: Diagnosis present

## 2023-09-28 DIAGNOSIS — C649 Malignant neoplasm of unspecified kidney, except renal pelvis: Secondary | ICD-10-CM | POA: Diagnosis present

## 2023-09-28 DIAGNOSIS — J309 Allergic rhinitis, unspecified: Secondary | ICD-10-CM | POA: Diagnosis present

## 2023-09-28 DIAGNOSIS — J910 Malignant pleural effusion: Secondary | ICD-10-CM | POA: Diagnosis present

## 2023-09-28 DIAGNOSIS — E872 Acidosis, unspecified: Secondary | ICD-10-CM | POA: Diagnosis not present

## 2023-09-28 DIAGNOSIS — R630 Anorexia: Secondary | ICD-10-CM | POA: Diagnosis present

## 2023-09-28 DIAGNOSIS — R197 Diarrhea, unspecified: Principal | ICD-10-CM

## 2023-09-28 DIAGNOSIS — E875 Hyperkalemia: Secondary | ICD-10-CM | POA: Diagnosis present

## 2023-09-28 DIAGNOSIS — Z6829 Body mass index (BMI) 29.0-29.9, adult: Secondary | ICD-10-CM

## 2023-09-28 DIAGNOSIS — N186 End stage renal disease: Secondary | ICD-10-CM | POA: Diagnosis present

## 2023-09-28 DIAGNOSIS — Z789 Other specified health status: Secondary | ICD-10-CM

## 2023-09-28 DIAGNOSIS — K219 Gastro-esophageal reflux disease without esophagitis: Secondary | ICD-10-CM | POA: Diagnosis present

## 2023-09-28 DIAGNOSIS — M25512 Pain in left shoulder: Secondary | ICD-10-CM | POA: Diagnosis present

## 2023-09-28 DIAGNOSIS — Z992 Dependence on renal dialysis: Secondary | ICD-10-CM

## 2023-09-28 DIAGNOSIS — D631 Anemia in chronic kidney disease: Secondary | ICD-10-CM | POA: Diagnosis present

## 2023-09-28 DIAGNOSIS — I132 Hypertensive heart and chronic kidney disease with heart failure and with stage 5 chronic kidney disease, or end stage renal disease: Secondary | ICD-10-CM | POA: Diagnosis present

## 2023-09-28 DIAGNOSIS — E039 Hypothyroidism, unspecified: Secondary | ICD-10-CM | POA: Diagnosis present

## 2023-09-28 DIAGNOSIS — M316 Other giant cell arteritis: Secondary | ICD-10-CM

## 2023-09-28 LAB — DIFF MANUAL: Diff Based On: 115 {cells}

## 2023-09-28 LAB — CBC AND DIFFERENTIAL
Baso # K/uL: 0 THOU/uL (ref 0.0–0.2)
Eos # K/uL: 0.1 THOU/uL (ref 0.0–0.5)
Hematocrit: 36 % — ABNORMAL LOW (ref 37–52)
Hemoglobin: 10.7 g/dL — ABNORMAL LOW (ref 12.0–17.0)
Lymph # K/uL: 0.9 THOU/uL — ABNORMAL LOW (ref 1.0–5.0)
MCV: 84 fL (ref 75–100)
Mono # K/uL: 0.5 THOU/uL (ref 0.1–1.0)
Neut # K/uL: 12.7 THOU/uL — ABNORMAL HIGH (ref 1.5–6.5)
Nucl RBC # K/uL: 0 THOU/uL (ref 0.0–0.1)
Nucl RBC %: 0 /100{WBCs} (ref 0.0–0.2)
Platelets: 506 THOU/uL — ABNORMAL HIGH (ref 150–450)
RBC: 4.4 MIL/uL (ref 4.0–6.0)
RDW: 18.9 % — ABNORMAL HIGH (ref 0.0–15.0)
Seg Neut %: 89.5 %
WBC: 14.1 THOU/uL — ABNORMAL HIGH (ref 3.5–11.0)

## 2023-09-28 LAB — BASIC METABOLIC PANEL
Anion Gap: 18 — ABNORMAL HIGH (ref 7–16)
CO2: 29 mmol/L — ABNORMAL HIGH (ref 20–28)
Calcium: 9.3 mg/dL (ref 8.6–10.2)
Chloride: 92 mmol/L — ABNORMAL LOW (ref 96–108)
Creatinine: 6.48 mg/dL — ABNORMAL HIGH (ref 0.67–1.17)
Glucose: 90 mg/dL (ref 60–99)
Lab: 21 mg/dL — ABNORMAL HIGH (ref 6–20)
Potassium: 4.9 mmol/L (ref 3.3–5.1)
Sodium: 139 mmol/L (ref 133–145)
eGFR BY CREAT: 9 — AB

## 2023-09-28 LAB — TROPONIN T 0 HR HIGH SENSITIVITY (IP/ED ONLY): TROP T 0 HR High Sensitivity: 58 ng/L (ref 0–11)

## 2023-09-28 LAB — TROPONIN T 1 HR W/ DELTA HIGH SENSITIVITY
TROP T 0-1 HR DELTA High Sensitivity: -10 — ABNORMAL LOW (ref 0–2)
TROP T 1 HR High Sensitivity: 48 ng/L — ABNORMAL HIGH (ref 0–11)

## 2023-09-28 LAB — NT-PRO BNP: NT-pro BNP: 1900 pg/mL — ABNORMAL HIGH (ref 0–900)

## 2023-09-28 LAB — BLOOD BANK HOLD PINK

## 2023-09-28 LAB — TROPONIN T 3 HR W/ DELTA HIGH SENSITIVITY (IP/ED ONLY)
TROP T 0-3 HR DELTA High Sensitivity: -9 — ABNORMAL LOW (ref 0–4)
TROP T 3 HR High Sensitivity: 49 ng/L — ABNORMAL HIGH (ref 0–14)

## 2023-09-28 LAB — HOLD GRAY

## 2023-09-28 LAB — MAGNESIUM: Magnesium: 1.8 mg/dL (ref 1.6–2.5)

## 2023-09-28 LAB — HOLD BLUE

## 2023-09-28 NOTE — ED Provider Notes (Incomplete)
 History     Chief Complaint   Patient presents with    Chest Pain    Muscle Pain     Shane Pearson is a 63 y.o. male with PMH notable for ESRD (on HD, last received today), HTN, lumbosacral spondylosis who presents for multiple symptoms.  Reports developing progressive fatigue, generalized malaise, radiating body aches, bilateral chest pain with accompanying dyspnea on exertion, HA, dizziness and intermittent blurred vision that has been ongoing for the last 2 months.  Reports that he has had decreased appetite and subsequent decreased p.o. intake with reported approximately 10 pound weight loss over the last 2 months.  Endorses persistent, mucousy diarrhea that is otherwise without blood or pain over the last 2-3 weeks in the absence of accompanying abdominal pain or fevers.  Denies any recent trauma/falls, however reports intermittent, diffuse HA as well as worsening blurred vision more than his baseline without clear inciting event.  States that he continues to feel dizzy, primarily upon standing and has felt progressively more weak with alternating pains across bilateral upper/lower extremities that feel aching/cramping in nature causing decreased ability for him to complete daily activities, go to work any longer and have caused him to be largely bedbound most notably today.  Denies formal anticoagulation and reports that he was last dialyzed today and received full treatment without improvement in his symptoms ultimately prompting his presentation today      History provided by:  Patient  Language interpreter used: No          Medical/Surgical/Family History     Past Medical History[1]     Patient Active Problem List   Diagnosis Code    ESRD (end stage renal disease) on dialysis N18.6, Z99.2    HLD (hyperlipidemia) E78.5    Hypotension I95.9    Ischemic colitis K55.9    Allergic rhinitis J30.9    Arteriosclerosis of artery of extremity I70.209    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  First Pain Reported 0-10  Pain Scale: 7, Pain Location/Orientation: Arm Right;Arm Left;Chest;Leg Right, (09/28/23 1620)       Physical Exam  Vitals and nursing note reviewed.   Constitutional:       General: He is not in acute distress.     Appearance: Normal appearance. He is not diaphoretic.   HENT:      Head: Normocephalic and atraumatic.      Nose: Nose normal.      Mouth/Throat:      Mouth: Mucous membranes are dry.      Pharynx: Oropharynx is clear.   Eyes:      Extraocular Movements: Extraocular movements intact.      Conjunctiva/sclera: Conjunctivae normal.      Pupils: Pupils are  equal, round, and reactive to light.   Cardiovascular:      Rate and Rhythm: Normal rate and regular rhythm.      Pulses: Normal pulses.      Heart sounds: Normal heart sounds.   Pulmonary:      Effort: Pulmonary effort is normal.      Breath sounds: Normal breath sounds.   Abdominal:      General: Abdomen is flat. Bowel sounds are normal. There is no distension.      Palpations: Abdomen is soft.      Tenderness: There is no abdominal tenderness. There is no guarding or rebound.   Musculoskeletal:         General: No tenderness. Normal range of motion.      Cervical back: Normal range of motion and neck supple. No rigidity, tenderness or bony tenderness.      Thoracic back: No tenderness or bony tenderness.      Lumbar back: No tenderness or bony  tenderness.      Right lower leg: No edema.      Left lower leg: No edema.   Skin:     General: Skin is warm and dry.      Capillary Refill: Capillary refill takes less than 2 seconds.   Neurological:      Mental Status: He is alert and oriented to person, place, and time.      Cranial Nerves: Cranial nerves 2-12 are intact.      Sensory: Sensation is intact.      Comments: Strength 3/5 BLE w/o drift         Medical Decision Making   Patient seen by me on:  09/28/2023    Assessment:  Shane Pearson is a 63 y.o. male ith PMH notable for ESRD (on HD, last received today), HTN, lumbosacral spondylosis who presents for multiple symptoms.  On arrival in ED, patient is vitally stable, afebrile, and saturating well on room air.  Exam appears largely nonfocal at this time and without overt evidence of volume overload.  Lungs CTABL however, given endorsed intermittent chest pain and dyspnea on exertion, will obtain CXR for evaluation of pulmonary edema versus occult pulmonary infectious process.  Abdomen appears largely benign, soft and nontender with active bowel sounds throughout lowering suspicion for an emergent abdominal process at this time.  Given his reported mucousy diarrhea, there is suspicion for potential gastroenteritis vs colitis for which we will obtain stool studies for further evaluation.  He otherwise appears hemodynamically stable and afebrile, will consider further workup/imaging pending concerning lab findings such as leukocytosis or worsening of symptoms.  He appears weak across his BLE however without additional appreciable focal neurodeficits at this time.  Given the widespread symptoms that appear to have developed over the last 2 months, lower suspicion for acute CVA or ICH at this time.    Differential diagnosis:  Gastroenteritis  Gastritis  Colitis  Unstable angina  NSTEMI/STEMI  Pericarditis  CHF/pulmonary edema  PNA  Pneumonitis  Pleuritis  PE -considered however he is without tachycardia, tachypnea,  hypoxia and Wells score for PE currently low.  Electrolyte derangement  Failure to thrive    Plan:  Orders Placed This Encounter      Salmonella PCR      Shigella PCR      Campylobacter PCR      Shiga toxin PCR      Adenovirus DNA PCR (stool only)      Human Astrovirus PCR  Norovirus RNA PCR      Rotavirus A PCR      Sapovirus PCR      Giardia PCR      Cryptosporidium PCR      Entamoeba histolytica PCR      *Chest standard frontal and lateral views      CBC and differential      Basic metabolic panel      Magnesium       Troponin T 0 HR High Sensitivity      Troponin T 1 HR W/ Delta High Sensitivity      Troponin T 3 HR W/ Delta High Sensitivity      NT-pro BNP      Hold blue      Blood bank hold pink      Hold gray      Diff manual      Initiate contact isolation      EKG 12 lead      EKG: follow up      Place in Hospital Observation      lactated ringers  bolus 500 mL       EKG Interpretation:  Tracing reviewed by myself, normal sinus rhythm, no ischemic changes    Independent interpretation of imaging: low lung volumes on CXR, small plural effusions      ED Course and Disposition:  Leukocytosis noted with WBC 14.1 in absence of fever.  Trops elevated by largely adynamic, declined tylenol  or pain medication at this time.  BMP showing reduced renal function with hypochloremia.  BNP normal and CXR largely unremarkable outside of right lower opacity, most likely atelectasis however may consider repeat pending fever development or worsening symptoms.  Stool studies and RUQ panel ordered and pending.  Pt admitted to hospital obs pending stool studies for further monitoring and PT/OT.  Pt and spouse amendable to admission and in agreement with plan      ED Course as of 09/29/23 0021   Sat Sep 28, 2023   2020 NT-pro BNP(!): 1,900  In setting of ESRD     2020 TROP T 0 HR High Sensitivity(!!): 58   2210 TROP T 1 HR High Sensitivity(!): 48   2211 *Chest standard frontal and lateral views  Right basilar airspace opacity,  probably atelectasis in setting of low lung volumes. However, infection or aspiration could have a similar imaging appearance.     Minimal blunting of the right costophrenic angle, probably representing a trace pleural effusion.      Sun Sep 29, 2023   0021 TROP T 3 HR High Sensitivity(!): 49       SIMEON HUSSAR, MD      Resident Attestation:    Patient seen by me on 09/28/2023.  I saw and evaluated the patient. I agree with the resident's/fellow's findings and plan of care as documented above.    Author:  BERNARDINO DRY, MD         SIMEON HUSSAR, MD  Resident  09/29/23 0045         [1]   Past Medical History:  Diagnosis Date    Arthritis     CHF (congestive heart failure)     Colon polyp     Dialysis patient     ESRD (end stage renal disease)     GERD (gastroesophageal reflux disease)     Hypercholesterolemia     Hypotension     Ischemic colitis     Thyroid disease     hypo   [2]  Past Surgical History:  Procedure Laterality Date    DIALYSIS FISTULA CREATION Bilateral     4 fistulas have failed- ? d/t low BP and small veins    HERNIA REPAIR      Right inguinal hernia repair     [3]   Social History  Tobacco Use    Smoking status: Former     Packs/day: 0.30     Years: 15.00     Additional pack years: 0.00     Total pack years: 4.50     Types: Cigarettes     Quit date: 08/02/1992     Years since quitting: 31.1    Smokeless tobacco: Never   Substance Use Topics    Alcohol use: No    Drug use: No        Clearance Motto, MD  09/30/23 1759

## 2023-09-28 NOTE — Bed Hold Note (Signed)
 Bed: AC-HB05  Expected date:   Expected time:   Means of arrival:   Comments:  WR

## 2023-09-28 NOTE — ED Notes (Signed)
 Report Given To  Genesis, RN       Descriptive Sentence / Reason for Admission   Dialysis TThSat. Pt endorses several weeks of generalized body aches / muscle pain, fatigue, loss of appetite, frothy white stools, and unplanned weight loss. Pt also endorses Right-sided chest pain. Pt evaluated on 7/7 for similar symptoms including acute chest pain.      Active Issues / Relevant Events   A/Ox4  Ambulatory @ baseline   Dialysis TThSat (did have dialysis today)        To Do List  VS/A  Meds per [facility]        Anticipatory Guidance / Discharge Planning  Pending

## 2023-09-28 NOTE — ED Triage Notes (Signed)
 Dialysis TThSat. Pt endorses several weeks of generalized body aches / muscle pain, fatigue, loss of appetite, frothy white stools, and unplanned weight loss. Pt also endorses Right-sided chest pain. Pt evaluated on 7/7 for similar symptoms including acute chest pain. EKG in triage    Prehospital medications given: No

## 2023-09-29 ENCOUNTER — Observation Stay

## 2023-09-29 ENCOUNTER — Ambulatory Visit: Payer: Self-pay

## 2023-09-29 DIAGNOSIS — R937 Abnormal findings on diagnostic imaging of other parts of musculoskeletal system: Secondary | ICD-10-CM

## 2023-09-29 DIAGNOSIS — R700 Elevated erythrocyte sedimentation rate: Secondary | ICD-10-CM

## 2023-09-29 DIAGNOSIS — M5412 Radiculopathy, cervical region: Secondary | ICD-10-CM

## 2023-09-29 DIAGNOSIS — J90 Pleural effusion, not elsewhere classified: Secondary | ICD-10-CM

## 2023-09-29 DIAGNOSIS — M25512 Pain in left shoulder: Secondary | ICD-10-CM

## 2023-09-29 DIAGNOSIS — M19012 Primary osteoarthritis, left shoulder: Secondary | ICD-10-CM

## 2023-09-29 DIAGNOSIS — H539 Unspecified visual disturbance: Secondary | ICD-10-CM

## 2023-09-29 DIAGNOSIS — N250 Renal osteodystrophy: Secondary | ICD-10-CM

## 2023-09-29 DIAGNOSIS — R627 Adult failure to thrive: Secondary | ICD-10-CM

## 2023-09-29 DIAGNOSIS — R197 Diarrhea, unspecified: Principal | ICD-10-CM

## 2023-09-29 DIAGNOSIS — M25511 Pain in right shoulder: Secondary | ICD-10-CM

## 2023-09-29 DIAGNOSIS — R079 Chest pain, unspecified: Secondary | ICD-10-CM

## 2023-09-29 DIAGNOSIS — R5381 Other malaise: Secondary | ICD-10-CM

## 2023-09-29 DIAGNOSIS — G8929 Other chronic pain: Secondary | ICD-10-CM

## 2023-09-29 DIAGNOSIS — J9811 Atelectasis: Secondary | ICD-10-CM

## 2023-09-29 DIAGNOSIS — H532 Diplopia: Secondary | ICD-10-CM

## 2023-09-29 DIAGNOSIS — H538 Other visual disturbances: Secondary | ICD-10-CM

## 2023-09-29 LAB — RUQ PANEL (ED ONLY)
ALT: 10 U/L (ref 0–50)
AST: 69 U/L — ABNORMAL HIGH (ref 0–50)
Albumin: 3.6 g/dL (ref 3.5–5.2)
Alk Phos: 243 U/L — ABNORMAL HIGH (ref 40–130)
Amylase: 71 U/L (ref 28–100)
Bilirubin,Direct: <0.2 mg/dL (ref 0.0–0.3)
Bilirubin,Total: 0.3 mg/dL (ref 0.0–1.2)
Lipase: 24 U/L (ref 13–60)
Total Protein: 6.6 g/dL (ref 6.3–7.7)

## 2023-09-29 LAB — BASIC METABOLIC PANEL
Anion Gap: 18 — ABNORMAL HIGH (ref 7–16)
CO2: 29 mmol/L — ABNORMAL HIGH (ref 20–28)
Calcium: 9.1 mg/dL (ref 8.6–10.2)
Chloride: 90 mmol/L — ABNORMAL LOW (ref 96–108)
Creatinine: 7.38 mg/dL — ABNORMAL HIGH (ref 0.67–1.17)
Glucose: 81 mg/dL (ref 60–99)
Lab: 26 mg/dL — ABNORMAL HIGH (ref 6–20)
Potassium: 5.1 mmol/L (ref 3.3–5.1)
Sodium: 137 mmol/L (ref 133–145)
eGFR BY CREAT: 8 — AB

## 2023-09-29 LAB — HEPATIC FUNCTION PANEL
ALT: 8 U/L (ref 0–50)
AST: 71 U/L — ABNORMAL HIGH (ref 0–50)
Albumin: 3.4 g/dL — ABNORMAL LOW (ref 3.5–5.2)
Alk Phos: 238 U/L — ABNORMAL HIGH (ref 40–130)
Bilirubin,Direct: <0.2 mg/dL (ref 0.0–0.3)
Bilirubin,Total: 0.3 mg/dL (ref 0.0–1.2)
Total Protein: 6.3 g/dL (ref 6.3–7.7)

## 2023-09-29 LAB — CBC AND DIFFERENTIAL
Baso # K/uL: 0 THOU/uL (ref 0.0–0.2)
Eos # K/uL: 0.1 THOU/uL (ref 0.0–0.5)
Hematocrit: 35 % — ABNORMAL LOW (ref 37–52)
Hemoglobin: 10.1 g/dL — ABNORMAL LOW (ref 12.0–17.0)
IMM Granulocytes #: 0.1 THOU/uL — ABNORMAL HIGH
IMM Granulocytes: 0.9 %
Lymph # K/uL: 1.3 THOU/uL (ref 1.0–5.0)
MCV: 84 fL (ref 75–100)
Mono # K/uL: 1.7 THOU/uL — ABNORMAL HIGH (ref 0.1–1.0)
Neut # K/uL: 10.9 THOU/uL — ABNORMAL HIGH (ref 1.5–6.5)
Nucl RBC # K/uL: 0 THOU/uL (ref 0.0–0.1)
Nucl RBC %: 0 /100{WBCs} (ref 0.0–0.2)
Platelets: 507 THOU/uL — ABNORMAL HIGH (ref 150–450)
RBC: 4.2 MIL/uL (ref 4.0–6.0)
RDW: 19.2 % — ABNORMAL HIGH (ref 0.0–15.0)
Seg Neut %: 76.7 %
WBC: 14.3 THOU/uL — ABNORMAL HIGH (ref 3.5–11.0)

## 2023-09-29 LAB — PSA (EFF.4-2010): PSA (eff. 4-2010): 0.22 ng/mL (ref 0.00–4.00)

## 2023-09-29 LAB — FERRITIN: Ferritin: 650 ng/mL — ABNORMAL HIGH (ref 20–250)

## 2023-09-29 LAB — MAGNESIUM: Magnesium: 1.7 mg/dL (ref 1.6–2.5)

## 2023-09-29 LAB — CK: CK: 67 U/L (ref 39–308)

## 2023-09-29 LAB — SEDIMENTATION RATE, AUTOMATED: Sedimentation Rate: 121 mm/h — ABNORMAL HIGH (ref 0–20)

## 2023-09-29 LAB — CRP: CRP: 365 mg/L — ABNORMAL HIGH (ref 0–8)

## 2023-09-29 LAB — HOLD GRAY

## 2023-09-29 LAB — HOLD BLUE

## 2023-09-29 LAB — CA 19 9 (EFF. 01-2011): CA 19 9 (eff. 01-2011): 19 U/mL (ref 0–35)

## 2023-09-29 LAB — PHOSPHORUS: Phosphorus: 3.9 mg/dL (ref 2.7–4.5)

## 2023-09-29 LAB — AFP TUMOR MARKER: AFP (eff. 4-2010): <4 [IU]/mL (ref 0–7)

## 2023-09-29 LAB — CEA: CEA: 5.2 ng/mL — ABNORMAL HIGH (ref 0.0–4.7)

## 2023-09-29 LAB — HOLD GREEN NO GEL

## 2023-09-29 LAB — GGT: GGT: 30 U/L (ref 8–61)

## 2023-09-29 MED ORDER — FLUTICASONE PROPIONATE 50 MCG/ACT NA SUSP *I*
1.0000 | Freq: Every day | NASAL | Status: DC
Start: 2023-09-29 — End: 2023-10-10
  Administered 2023-09-29 – 2023-10-10 (×9): 1 via NASAL
  Filled 2023-09-29 (×2): qty 16

## 2023-09-29 MED ORDER — CINACALCET HCL 30 MG PO TABS *I*
30.0000 mg | ORAL_TABLET | Freq: Every day | ORAL | Status: DC
Start: 2023-09-29 — End: 2023-10-10
  Administered 2023-09-29 – 2023-10-10 (×12): 30 mg via ORAL
  Filled 2023-09-29 (×14): qty 1

## 2023-09-29 MED ORDER — CALCIUM CARBONATE-VITAMIN D 500-200 MG-UNIT PO TABS *WRAPPED*
1.0000 | ORAL_TABLET | Freq: Two times a day (BID) | ORAL | Status: DC
Start: 2023-09-29 — End: 2023-10-10
  Administered 2023-09-29 – 2023-10-10 (×21): 1 via ORAL
  Filled 2023-09-29 (×26): qty 1

## 2023-09-29 MED ORDER — ASPIRIN 81 MG PO TBEC *I*
81.0000 mg | DELAYED_RELEASE_TABLET | Freq: Every day | ORAL | Status: DC
Start: 2023-09-29 — End: 2023-11-28
  Administered 2023-09-29 – 2023-10-01 (×3): 81 mg via ORAL
  Filled 2023-09-29 (×4): qty 1

## 2023-09-29 MED ORDER — HYDROMORPHONE HCL 2 MG PO TABS *I*
2.0000 mg | ORAL_TABLET | Freq: Once | ORAL | Status: AC
Start: 2023-09-29 — End: 2023-09-29
  Administered 2023-09-29: 2 mg via ORAL
  Filled 2023-09-29: qty 1

## 2023-09-29 MED ORDER — LACTATED RINGERS IV BOLUS *I*
500.0000 mL | Freq: Once | INTRAVENOUS | Status: AC
Start: 2023-09-29 — End: 2023-09-29
  Administered 2023-09-29: 500 mL via INTRAVENOUS

## 2023-09-29 MED ORDER — NEPHRO-VITE 0.8 MG PO TABS *I*
1.0000 | ORAL_TABLET | Freq: Every day | ORAL | Status: DC
Start: 2023-09-29 — End: 2023-10-10
  Administered 2023-09-29 – 2023-10-10 (×11): 1 via ORAL
  Filled 2023-09-29 (×13): qty 1

## 2023-09-29 MED ORDER — ATORVASTATIN CALCIUM 40 MG PO TABS *I*
20.0000 mg | ORAL_TABLET | Freq: Every day | ORAL | Status: DC
Start: 2023-09-29 — End: 2023-10-10
  Administered 2023-09-29 – 2023-10-09 (×10): 20 mg via ORAL
  Filled 2023-09-29 (×12): qty 1

## 2023-09-29 MED ORDER — HEPARIN SODIUM 5000 UNIT/ML SQ *I*
5000.0000 [IU] | Freq: Three times a day (TID) | SUBCUTANEOUS | Status: DC
Start: 2023-09-29 — End: 2023-11-28
  Administered 2023-09-29 – 2023-10-02 (×10): 5000 [IU] via SUBCUTANEOUS
  Filled 2023-09-29 (×11): qty 1

## 2023-09-29 MED ORDER — ACETAMINOPHEN 500 MG PO TABS *I*
1000.0000 mg | ORAL_TABLET | Freq: Three times a day (TID) | ORAL | Status: DC | PRN
Start: 2023-09-29 — End: 2023-10-01
  Administered 2023-09-29: 1000 mg via ORAL
  Filled 2023-09-29: qty 2

## 2023-09-29 MED ORDER — FLUDROCORTISONE ACETATE 100 MCG PO TABS *I*
0.1000 mg | ORAL_TABLET | Freq: Three times a day (TID) | ORAL | Status: DC
Start: 2023-09-29 — End: 2023-10-10
  Administered 2023-09-29 – 2023-10-10 (×33): 0.1 mg via ORAL
  Filled 2023-09-29 (×42): qty 1

## 2023-09-29 MED ORDER — SODIUM CHLORIDE 0.9 % FLUSH FOR PUMPS *I*
0.0000 mL/h | INTRAVENOUS | Status: DC | PRN
Start: 2023-09-29 — End: 2023-10-10
  Administered 2023-10-02 – 2023-10-03 (×2): 100 mL/h via INTRAVENOUS
  Administered 2023-10-04: 30 mL/h via INTRAVENOUS
  Administered 2023-10-07: 100 mL/h via INTRAVENOUS
  Administered 2023-10-08 (×2): 30 mL/h via INTRAVENOUS

## 2023-09-29 MED ORDER — OMEPRAZOLE 40 MG PO CPDR *I*
40.0000 mg | DELAYED_RELEASE_CAPSULE | Freq: Every morning | ORAL | Status: DC
Start: 2023-09-29 — End: 2023-10-10
  Administered 2023-09-29 – 2023-10-10 (×11): 40 mg via ORAL
  Filled 2023-09-29 (×8): qty 1
  Filled 2023-09-29: qty 2
  Filled 2023-09-29: qty 1
  Filled 2023-09-29: qty 2
  Filled 2023-09-29 (×2): qty 1

## 2023-09-29 MED ORDER — DEXTROSE 5 % FLUSH FOR PUMPS *I*
0.0000 mL/h | INTRAVENOUS | Status: DC | PRN
Start: 2023-09-29 — End: 2023-10-10

## 2023-09-29 MED ORDER — LIDOCAINE 4 % EX PATCH *I*
2.0000 | MEDICATED_PATCH | CUTANEOUS | Status: DC
Start: 2023-09-29 — End: 2023-10-10
  Administered 2023-09-29: 2 via TRANSDERMAL
  Filled 2023-09-29 (×4): qty 2

## 2023-09-29 MED ORDER — MIDODRINE HCL 5 MG PO TABS *I*
15.0000 mg | ORAL_TABLET | Freq: Three times a day (TID) | ORAL | Status: DC
Start: 2023-09-29 — End: 2023-10-10
  Administered 2023-09-29 – 2023-10-10 (×34): 15 mg via ORAL
  Filled 2023-09-29 (×35): qty 3

## 2023-09-29 NOTE — Progress Notes (Signed)
 09/29/23 1541   [facility] Patient Class Review   Patient Class Review Observation     Patient class effective as of 09/29/23.      Pt notified at bedside of OBS(MOON) @ 1452 . Copy given to patient, and copy signed by patient for chart.        Freddy Frank RN BSN    Utilization Management     Secure chat or [phone]

## 2023-09-29 NOTE — Provider Consult (Signed)
 Nephrology Note 09/29/2023 7:57 AM    Chief Complaint: shoulder pain    Reason for Consult: Management of ESRD      Assessment/Plan  Shane Pearson is a 63 y.o. Male  ESRD on HD who comes in for multiple constitutional symptoms.  Nephrology for ESRD management.  Normally dialysis via HD tunneled cath at [facility] on Tuesday Thursday Saturday schedule    HTN nephrosclerosis   ESRD on HD TTS at [facility]    1. Hemodialysis Note:   Presently dialyzing for 3 hours:     F180 Na 138 K 2 Ca 2.5 HCO3 35 Qb: 400 ml/min Qd: 1.5X Qb UF 0.5-3L as hemodynamics tolerates     Low potassium, low phosphorus, low sodium diet please  Dose all medications for creatinine clearance < 10 ml/min   Limit beverage intake to 1.5 L per day    K management :  HD as above      Volume status:     - continue florinef   - continue midodrine    - UF as able with HD     Anemia:   Hemoglobin at goal.   No changes.     CKD BMD:  - continue hom vit D and cinacalcet     Vascular Access:  HDTC    History of Present Illness:  Shane Pearson is a 63 y.o. Male past medical history of ESRD on dialysis Tuesday Thursday Saturday via left HD tunneled catheter.  Presenting in the setting of pain and difficulty with ambulation.    His last HD session was Saturday.  Has HD at [facility]      Review of Systems   10 point ROS is otherwise negative except those mentioned above    Past Medical History[1]    Past Surgical History[2]    Family History[3]    Social History     Socioeconomic History    Marital status: Married   Tobacco Use    Smoking status: Former     Packs/day: 0.30     Years: 15.00     Additional pack years: 0.00     Total pack years: 4.50     Types: Cigarettes     Quit date: 08/02/1992     Years since quitting: 31.1    Smokeless tobacco: Never   Substance and Sexual Activity    Alcohol use: No    Drug use: No       Allergies: Allergies[4]    Prior to Admission Medications:  (Not in a hospital admission)      Current Meds:  Scheduled Meds:   aspirin   81 mg  Oral Daily    atorvastatin   20 mg Oral Daily with dinner    B complex-vitamin C-folic acid   1 tablet Oral Daily    calcium -vitamin D   1 tablet Oral BID    cinacalcet   30 mg Oral Daily    fludrocortisone   0.1 mg Oral TID    fluticasone   1 spray Nasal Daily    midodrine   15 mg Oral TID    omeprazole   40 mg Oral QAM    Lidocaine   2 patch Transdermal Q24H    heparin  (porcine)  5,000 units Subcutaneous TID     Continuous Infusions:  PRN Meds:.sodium chloride , dextrose , acetaminophen      Physical Exam:  Blood pressure 99/59, pulse 83, temperature 37.1 C (98.8 F), temperature source Temporal, resp. rate 18, weight 80.1 kg (176 lb 9.4 oz), SpO2 100%.    Gen:  Sleeping, uncomfortable  ENT:  MMM;  normal external facial features  Neck: Supple; No Kyphosis  Chest: Clear to auscultation with normal respiratory effort;   Abdomen: soft, nt, nd  Skin:  No rashes or ulcers on visible skin      Recent Labs   Lab 09/29/23  0327 09/28/23  1926   Sodium 137 139   Potassium 5.1 4.9   Chloride 90* 92*   CO2 29* 29*   UN 26* 21*   Creatinine 7.38* 6.48*   Glucose 81 90   Calcium  9.1 9.3   Albumin 3.4* 3.6   Phosphorus 3.9  --    Magnesium  1.7 1.8     No results found for: PTH, VID25 Recent Labs   Lab 09/29/23  0327 09/28/23  1926   WBC 14.3* 14.1*   Hemoglobin 10.1* 10.7*   Hematocrit 35* 36*   Platelets 507* 506*       No results found for: FE, IBC, FESAT, FER   *Chest standard frontal and lateral views  Result Date: 09/28/2023  Right basilar airspace opacity, probably atelectasis in setting of low lung volumes. However, infection or aspiration could have a similar imaging appearance. Minimal blunting of the right costophrenic angle, probably representing a trace pleural effusion. END OF IMPRESSION I have personally reviewed the images and the Resident's/Fellow's interpretation and agree with or edited the findings.                Author: LYNWOOD HERTZ, MD  as of: 09/29/2023  at: 7:57 AM         [1]   Past Medical  History:  Diagnosis Date    Arthritis     CHF (congestive heart failure)     Colon polyp     Dialysis patient     ESRD (end stage renal disease)     GERD (gastroesophageal reflux disease)     Hypercholesterolemia     Hypotension     Ischemic colitis     Thyroid disease     hypo   [2]   Past Surgical History:  Procedure Laterality Date    DIALYSIS FISTULA CREATION Bilateral     4 fistulas have failed- ? d/t low BP and small veins    HERNIA REPAIR      Right inguinal hernia repair     [3]   Family History  Problem Relation Name Age of Onset    Diabetes Mother      Asthma Mother      Coronary artery disease Father     [4]   Allergies  Allergen Reactions    Penicillins Swelling     Face    Coconut Flavoring Agent (Non-Screening) Nausea And Vomiting    Amoxicillin Rash    Baclofen Other (See Comments)     confusion    No Known Latex Allergy

## 2023-09-29 NOTE — Plan of Care (Signed)
 Brief Ophthalmology Note    Please be aware that Shane Pearson, [id number], had their eyes pharmacologically dilated for the purposes of an ophthalmic dilated fundus exam on 09/29/2023 at approximately 3:48 PM.    SHERRE PRENTICE SQUIBB, MD  Ophthalmology PGY-3  09/29/2023, 3:48 PM

## 2023-09-29 NOTE — Provider Consult (Signed)
 RHEUMATOLOGY  New Inpatient Consult      09/29/2023  Admit date: 09/28/2023    Consult requested by: Dr. Tammy  Consult Attending: Dr. Neoma  Clinical question: Concern for new onset PMR    HPI:  Shane Pearson is a 63 y.o. male with ESRD on HD, osteoarthritis of left shoulder, lumbosacral spondylosis who presented with subacute progressive left shoulder pain along with myalgias, fatigue, weight loss.     Shane Pearson reports chronic left shoulder pain that has acutely worsened over the last few weeks. He presented to [facility] ED on 7/7 for worsening shoulder and chest pain that travels through his body. In the ED vital signs were stable. Workup at that time demonstrated CBC without leukocytosis, baseline anemia, Na 132. BUN 48, creatinine 11.1 (baseline). Troponin's <3 x2. EKG NSR without evidence of ischemia. CXR with minimal left basilar atelectasis, suspected small right effusion. The patient was admitted for overnight telemetry monitoring. Patient underwent a nuclear stress test on 07/08 without acute findings of ischemia.    He then saw PCP on 7/14 for persistence of left shoulder, chest, and groin pain along with fatigue, weight loss. PCP recommended topical diclofenac gel QID PRN for chest pain, and thought muscle pain and lack of appetite were likely secondary to hyponatremia and scheduled PT. He then presented to [facility] ED on 7/19 because he felt so fatigued that he could not function.     Today, Shane Pearson reports significant bilateral shoulder and groin pain. He has been walking with a cane at home but for the last few days has been barely able to ambulate due to pain. He has had 11 lbs of weight loss in the last 8 weeks, and reports 2 weeks of blurry vision, double vision, and loss of depth perception. He has occasional headaches, and continues to have chest pain. He denies SOB, wheezing, cough, congestion, fevers at home. No new rashes. He is having diarrhea over the last few days. He also reports fatigue with chewing  and feels that he has less saliva in his mouth than usual.     In the ED, vital signs were within normal limits. Exam was notable for dry mucous membranes and decreased strength in bilateral lower extremities.  Labs demonstrated elevated troponins, baseline Cr with elevated anion gap, AST elevated (69), CBCd with leokocytosis, thrombocytosis, ESR 121. CXR with right basilar airspace opacity with low lung volumes, CT left shoulder without evidence of fracture, CT cervical spine with patchy lucency within C7 vertebral body and large right and moderate left pleural effusions. He was admitted to hospital medicine for further management.       Review of Systems:   Review of Systems   Constitutional:  Positive for malaise/fatigue and weight loss. Negative for fever.   HENT:  Negative for congestion and hearing loss.    Eyes:  Positive for blurred vision and double vision. Negative for photophobia, pain and redness.   Respiratory:  Negative for cough, shortness of breath and wheezing.    Cardiovascular:  Positive for chest pain.   Gastrointestinal:  Positive for diarrhea. Negative for nausea and vomiting.   Musculoskeletal:  Positive for back pain, joint pain and myalgias.   Skin:  Negative for rash.   Neurological:  Positive for weakness and headaches.   Endo/Heme/Allergies: Negative.         ALLERGIES:  Allergies[1]    HOME MEDS:  Prior to Admission medications   Medication Sig Start Date End Date Taking? Authorizing Provider   midodrine  (  PROAMATINE ) 5 mg tablet Take 3 tablets (15 mg total) by mouth 3 times daily. 07/11/23  Yes Grewal, Rickinder S, MD   magnesium  oxide (MAGNESIUM  OXIDE -MG SUPPLEMENT) 400 (241.3 mg) mg tablet Take 1 tablet (400 mg total) by mouth daily. 05/30/23  Yes Grewal, Rickinder S, MD   acetaminophen  (TYLENOL ) 325 MG tablet Take 2 tablets (650 mg total) by mouth every 4-6 hours as needed for Pain.   Yes [provider]   aspirin  81 MG EC tablet Take 1 tablet (81 mg total) by mouth daily.    Yes [provider]   fluticasone  (FLONASE ) 50 MCG/ACT nasal spray Spray 1 spray into nostril daily.   Yes [provider]   atorvastatin  (LIPITOR) 10 MG tablet Take 2 tablets (20 mg total) by mouth daily (with dinner).   Yes [provider]   fludrocortisone  (FLORINEF ) 0.1 mg tablet Take 1 tablet (0.1 mg total) by mouth 3 times daily.   Yes [provider]   b complex-vitamin c-folic acid  (NEPHRO-VITE) tablet Take 1 tablet by mouth daily.   Yes [provider]   calcium -vitamin D  (OSCAL-500) 500-200 MG-UNIT per tablet Take 1 tablet by mouth 2 times daily.   Yes [provider]   cinacalcet  (SENSIPAR ) 30 MG tablet Take 1 tablet (30 mg total) by mouth daily.   Yes [provider]   omeprazole  (PRILOSEC) 20 MG capsule Take 2 capsules (40 mg total) by mouth daily (before breakfast).   Yes [provider]       INPATIENT MEDS:   aspirin   81 mg Oral Daily    atorvastatin   20 mg Oral Daily with dinner    B complex-vitamin C-folic acid   1 tablet Oral Daily    calcium -vitamin D   1 tablet Oral BID    cinacalcet   30 mg Oral Daily    fludrocortisone   0.1 mg Oral TID    fluticasone   1 spray Nasal Daily    midodrine   15 mg Oral TID    omeprazole   40 mg Oral QAM    Lidocaine   2 patch Transdermal Q24H    heparin  (porcine)  5,000 units Subcutaneous TID        sodium chloride   0-500 mL/hr Intravenous PRN    dextrose   0-500 mL/hr Intravenous PRN    acetaminophen   1,000 mg Oral Q8H PRN       PMHx/PSHx:  Past Medical History[2]    Past Surgical History[3]    FHx:  Family History[4]    SHx:  Social History     Socioeconomic History    Marital status: Married   Tobacco Use    Smoking status: Former     Packs/day: 0.30     Years: 15.00     Additional pack years: 0.00     Total pack years: 4.50     Types: Cigarettes     Quit date: 08/02/1992     Years since quitting: 31.1    Smokeless tobacco: Never   Substance and Sexual Activity    Alcohol use: No    Drug use: No        PHYSICAL EXAM:  BP 105/62 (BP Location: Right arm)   Pulse 87   Temp 36.6 C (97.9 F) (Temporal)   Resp 18   Wt 80.1 kg (176 lb 9.4 oz)   SpO2 100%   BMI 30.31 kg/m     Constitutional:    In no distress.    Eyes:    Conjunctivae  appear normal.     HENT:   Normocephalic, atraumatic and the nose is normal.     Lymph:   No adenopathy.    Cardiovascular:    Normal rate and regular rhythm.     There is no murmur.    Pulmonary:    Effort is normal.     Crackles in bilateral bases.    Abdominal:    The abdomen is soft.     There is no distention.   Skin:    Skin is warm and dry.     He has no erythema and no rash.    Extremities:    There is no edema of the extremities.   Neuro:    He is alert.     Musculoskeletal Exam:   Upper and lower extremities  There is peripheral muscle or soft tissue tenderness (tenderness to palpation of biceps tendons). There is no peripheral joint tenderness and no peripheral joint swelling.    Axial exam  There is no spinal tenderness.   MSK Comments  Unable to assess strength in proximal UE due to pain with movement, no weakness in LE            LABORATORY VALUES:  Recent Labs (72hrs):  Recent Labs     09/29/23  0327 09/28/23  1926   WBC 14.3* 14.1*   Hemoglobin 10.1* 10.7*   Hematocrit 35* 36*   MCV 84 84   Platelets 507* 506*   Sedimentation Rate 121*  --    CK  --  67     Recent Labs     09/29/23  0327 09/28/23  1926   Sodium 137 139   Potassium 5.1 4.9   CO2 29* 29*   Chloride 90* 92*   UN 26* 21*   Creatinine 7.38* 6.48*   Glucose 81 90   Calcium  9.1 9.3   Magnesium  1.7 1.8   AST 71* 69*   ALT 8 10   Alk Phos 238* 243*   Total Protein 6.3 6.6   Bilirubin,Total 0.3 0.3     No components found with this basename: PHOS    No results for input(s): UTPR, CREAU in the last 72 hours.    Rheum labs:  Lab Results   Component Value Date    CK 67 09/28/2023        Micro:         Imaging:  CT cervical spine without contrast  Result Date: 09/29/2023  Renal osteodystrophy. Asymmetric  patchy lucency within the C7 vertebral body extending into the left transverse process and left facet. There is also asymmetric soft tissue prominence in this area compared to the right side. This appears atypical for fracture, however given the patient's left-sided radicular symptoms a cervical spine MRI may be of value to further assess this finding. No obvious spinal canal or significant neuroforaminal narrowing by CT. At least large right and moderate left pleural effusions, radiographically occult on prior chest x-ray. This may be related to volume overload in the setting of ESRD. END OF IMPRESSION    *Chest standard frontal and lateral views  Result Date: 09/28/2023  Right basilar airspace opacity, probably atelectasis in setting of low lung volumes. However, infection or aspiration could have a similar imaging appearance. Minimal blunting of the right costophrenic angle, probably representing a trace pleural effusion. END OF IMPRESSION I have personally reviewed the images and the Resident's/Fellow's interpretation and agree with or edited the findings.  IMPRESSION:     Shane Pearson is a 63 y.o. male with ESRD on HD, osteoarthritis of left shoulder, lumbosacral spondylosis with chronic progressive upper extremity pain and weakness in the setting of constitutional symptoms (myalgias, anorexia, fatigue) and changes in vision, labs demonstrating elevated inflammatory markers, imaging thus far concerning for bilateral pleural effusions. His clinical picture is concerning for GCA and recommend urgent optho evaluation and temporal artery biopsy. Typically we recommend empiric treatment with corticosteroids, but given additional constitutional symptoms and pleural effusions, there is some concern for malignancy which should be further investigated and ruled out before treatment with steroids. PMR is on the differential given time course and proximal muscle weakness with evidence of systemic inflammation, but will  need to rule out GCA first. Lupus is on the differential though less likely based on clinical picture. Should also consider stills disease though no fevers or rash, making this less likely. He is otherwise HDS at this time.      RECOMMENDATIONS:     CXR/pleurocentesis to further assess for malignancy  Optho consult/temporal artery biopsy  Consider CT head for vision changes to rule out intracranial process  Will hold on systemic corticosteroids until malignancy ruled out  Please obtain ANA, ferritin, and continue to trend inflammatory markers      Delon Gobble, MD, MS  Allergy/Immunology Fellow, PGY-5  09/29/23  1:08 PM      Patient seen and discussed with the attending, Dr. Neoma  Please page or Epic chat with any questions or concerns.          [1]   Allergies  Allergen Reactions    Penicillins Swelling     Face    Coconut Flavoring Agent (Non-Screening) Nausea And Vomiting    Amoxicillin Rash    Baclofen Other (See Comments)     confusion    No Known Latex Allergy    [2]   Past Medical History:  Diagnosis Date    Arthritis     CHF (congestive heart failure)     Colon polyp     Dialysis patient     ESRD (end stage renal disease)     GERD (gastroesophageal reflux disease)     Hypercholesterolemia     Hypotension     Ischemic colitis     Thyroid disease     hypo   [3]   Past Surgical History:  Procedure Laterality Date    DIALYSIS FISTULA CREATION Bilateral     4 fistulas have failed- ? d/t low BP and small veins    HERNIA REPAIR      Right inguinal hernia repair     [4]   Family History  Problem Relation Name Age of Onset    Diabetes Mother      Asthma Mother      Coronary artery disease Father

## 2023-09-29 NOTE — ED Notes (Signed)
 Report Given To  Prativa & Dan, RN      Descriptive Sentence / Reason for Admission   Endorsing several weeks of generalized body aches, muscle pain, fatigue, loss of appetite, frothy/mucous-like white stools, and unplanned weight loss. Pt also endorses right-sided chest pain. Pt evaluated on 7/7 for similar symptoms including acute chest pain. States that he cannot go to work any longer and is struggling to complete daily activities.       Active Issues / Relevant Events   A/Ox4  RA, ambulatory @ baseline   ESRD, dialysis T/Th/Sat (received dialysis 07/19)  Limb alert for LUE  Stand/pivoted + wheelchaired to bathroom (no stool)  CXR: possible atelectasis/trace pleural effusion   WBC 14.1  0h trop 58 --> 48 --> 49  Chronically hypotensive, provider aware  LR bolus      To Do List  VS/A  Meds per [facility]  Pain management  Stool sample for potential gastroenteritis vs colitis   CT left shoulder  CT cervical spine      Anticipatory Guidance / Discharge Planning  Admit for failure to thrive.

## 2023-09-29 NOTE — H&P (Signed)
 Hospital Medicine H&P  Admit date: 09/28/2023  Current date: 09/29/2023  Code status: Full Code    Chief Complaint: Chest pain and left shoulder pain    HPI & Pertinent Collateral Hx     Shane Pearson is a 63 y.o. male with history significant for ESRD (on HD TTS), hypotension, and lumbosacral spondylosis who presented to the ED for chest pain and left shoulder pain.    Of note, he presented to another ED on 09/16/23 for chest pain.  The work up was unrevealing and he was discharged home.  He reports having multiple complaints that have persisted for the past 6 months and have progressed over the past 2 weeks.  He endorses left shoulder and arm pain with an intermittent shooting pain that radiates down into his fingertips.  This is associated with sensory disturbances as well.  As a result, he has limited range of motion of the left upper extremity.  He reports issues with the right lower extremity, including weakness.  He reports the weakness has made it difficult to ambulate and perform his job.  He denies sensory issues in any other extremities.  He reports a migrating musculoskeletal pain that affected the left side of his chest today and subsequently went to the right side of his chest.  When asked if this is a crushing or stabbing pain, he replied it is a muscle pain.  He endorses losing 11 pounds over the past several months.  He reports night sweats over the past few weeks.  He endorses poor appetite during this time as well, but denies dysphagia, nausea, vomiting, or abdominal pain.  He reports poor eyesight with occasional blurred vision that did not improve with an updated glasses prescription.  He reports headaches, dry mouth, and shivering but denies fever.  He attends dialysis on Tuesday, Thursday, and Saturday with last session on 7/19.  He tolerated the session well with no concerns.  He does not make any urine.  He takes a magnesium  supplement and his bowel movements have fluctuated between being loose  and constipated over the past several weeks.  Most recently, he has had frothy/mucus-like stools.  He reports rarely seeing a small amount of bright red blood in the bowl, but has a history of hemorrhoids.  He previously had issues with lightheadedness upon standing, but this has been better over the past few weeks.  He denies sick contacts, but did travel to [place] a few weeks ago.  He notes that many of these symptoms were present prior to traveling.  He was advised to attend physical therapy for his arm and shoulder, but he never started this process.  He denies trauma or any falls involving the left shoulder.  He reports he stopped smoking 30 years ago.  He reports he stopped drinking alcohol 3 months ago, and prior to that only had 2 drinks every few months.  He denies the use of other drugs.    Additional medical history: Allergic rhinitis, GERD, and HLD    In the ED - VS with T 36.8C, BP 114/69, HR 100, RR 14, O2 sat 97% on RA    Labs significant for:  CBC: WBC 14.1, Hgb 10.7, Plt 506  BMP: Na 139, K 4.9, HCO3 29, AG 18, BUN 21, Cr 6.48  RUQ panel: AST 69, Alk Phos 243  NT-pro BNP 1900  Troponin 58 (0 hr) -> 48 (1 hr) -> 49 (3 hr)  Stool studies: Ordered    EKG: Sinus rhythm,  minimal ST depression in V6, QTc 516    Imaging:  Chest x-ray: Right basilar airspace opacity, likely atelectasis. Minimal blunting of the right costophrenic angle, likely trace pleural effusion.    ED interventions: Gave LR 500 cc bolus    ROS: Per HPI.    PMH:   Past Medical History[1]     PSH:  Past Surgical History[2]     SH:    reports that he quit smoking about 31 years ago. His smoking use included cigarettes. He has a 4.5 pack-year smoking history. He has never used smokeless tobacco.   reports no history of alcohol use.   reports no history of drug use.    Allergies:   Allergies as of 09/28/2023 - Up to Date 09/28/2023   Allergen Reaction Noted    Penicillins Swelling 11/10/2013    Coconut flavoring agent (non-screening) Nausea  And Vomiting 08/03/2010    Amoxicillin Rash 08/03/2010    Baclofen Other (See Comments) 10/02/2017    No known latex allergy  08/03/2010        Home Meds:  Prior to Admission medications   Medication Sig Start Date End Date Taking? Authorizing Provider   midodrine  (PROAMATINE ) 5 mg tablet Take 3 tablets (15 mg total) by mouth 3 times daily. 07/11/23   Grewal, Rickinder S, MD   fluticasone  (FLOVENT  DISKUS) 50 MCG/ACT diskus inhaler Inhale 1 puff into the lungs. 06/30/15   [provider]   magnesium  oxide (MAGNESIUM  OXIDE -MG SUPPLEMENT) 400 (241.3 mg) mg tablet Take 1 tablet (400 mg total) by mouth daily. 05/30/23   Grewal, Rickinder GORMAN, MD   acetaminophen  (TYLENOL ) 325 MG tablet Take 2 tablets (650 mg total) by mouth every 4-6 hours as needed for Pain.    [provider]   aspirin  81 MG EC tablet Take 1 tablet (81 mg total) by mouth daily.    [provider]   fluticasone  (FLONASE ) 50 MCG/ACT nasal spray Spray 1 spray into nostril daily.    [provider]   atorvastatin  (LIPITOR) 10 MG tablet Take 2 tablets (20 mg total) by mouth daily (with dinner).    [provider]   fludrocortisone  (FLORINEF ) 0.1 mg tablet Take 1 tablet (0.1 mg total) by mouth 3 times daily.    [provider]   b complex-vitamin c-folic acid  (NEPHRO-VITE) tablet Take 1 tablet by mouth daily.    [provider]   calcium -vitamin D  (OSCAL-500) 500-200 MG-UNIT per tablet Take 1 tablet by mouth 2 times daily.    [provider]   cinacalcet  (SENSIPAR ) 30 MG tablet Take 1 tablet (30 mg total) by mouth daily.    [provider]   omeprazole  (PRILOSEC) 20 MG capsule Take 2 capsules (40 mg total) by mouth daily (before breakfast).    [provider]   minocycline  (MINOCIN ) 100 MG capsule Take 50 mg by mouth 2 times daily     [provider]        Objective     Vitals Range In/Out   BP: (87-114)/(55-69)   Temp:  [36.8 C (98.2 F)-37.8 C (100 F)]   Temp  src: Temporal (07/20 0153)  Heart Rate:  [84-100]   Resp:  [14-16]   SpO2:  [91 %-97 %]   Weight:  [80.1 kg (176 lb 9.4 oz)]  No intake or output data in the 24 hours ending 09/29/23 0336     Physical Exam:  General: Reclined in hospital bed, no acute distress  HEENT: Dry MM  CV: RRR, normal S1/S2  Chest: Not TTP, HD cath in place   Pulmonary: Normal WOB, lungs CTAB  Abdominal: Soft, mild LLQ and suprapubic TTP, mildly distended, +BS  Neurological: A&Ox3, cranial nerves intact, strength 5/5 in RUE, LLE, and RLE; LUE strength limited by pain, especially with passive external rotation, but no edema, warmth, or TTP of left shoulder; finger to nose and heel to shin normal, no sensory deficits  Extremities: No LE edema  Skin: Warm and dry    Lab studies:  BMP/Electrolytes CBC   Recent Labs     09/28/23  1926   Sodium 139   Potassium 4.9   Chloride 92*   CO2 29*   UN 21*   Creatinine 6.48*   Glucose 90   Calcium  9.3   Magnesium  1.8   Albumin 3.6    Recent Labs   Lab 09/28/23  1926   WBC 14.1*   Hemoglobin 10.7*   Hematocrit 36*   Platelets 506*   Neut # K/uL 12.7*   Lymph # K/uL 0.9*   Mono # K/uL 0.5     No results for input(s): INR, PTT in the last 72 hours.    No components found with this basename: PT   GI/Liver Function Other   Recent Labs     09/28/23  1926   AST 69*   ALT 10   Bilirubin,Total 0.3   Bilirubin,Direct <0.2     No components found with this basename: ALKPHOS   Recent Labs   Lab 09/28/23  1926   Potassium 4.9   Calcium  9.3     No results for input(s): CHOL, TRIG, HDL, LDL, LDLC, NHDLC, CHHDC in the last 168 hours.  Recent Labs   Lab 09/28/23  1926   CK 67        Micro:          Imaging:   *Chest standard frontal and lateral views  Result Date: 09/28/2023  Right basilar airspace opacity, probably atelectasis in setting of low lung volumes. However, infection or aspiration could have a similar imaging appearance. Minimal blunting of the right costophrenic angle, probably representing  a trace pleural effusion. END OF IMPRESSION I have personally reviewed the images and the Resident's/Fellow's interpretation and agree with or edited the findings.     EKG/Telemetry: Sinus rhythm, minimal ST depression in V6, QTc 516           Assessment     Shane Pearson is a  63 y.o. male with history significant for ESRD (on HD TTS), hypotension, and lumbosacral spondylosis who presented to the ED for chest pain and left shoulder pain.  On examination, he has significant left shoulder pain with passive and active movement.  Initial work up was notable for mild leukocytosis, chronic anemia, thrombocytosis, elevated NT-pro BNP, elevated but adynamic troponin, elevated AST and Alk Phos, and ESRD.  Imaging with chest x-rays revealed right-sided opacity with concern for atelectasis and minimal right pleural effusion.  Overall, his constitutional symptoms are concerning for an occult infectious process, malignancy, or rheumatologic disorder.  The left shoulder pain raises concern for cervical radiculopathy, rotator cuff tendinopathy, or muscular injury.     Plan     #Left shoulder pain - Concern for tendinopathy, muscular injury, or radiculopathy  #Generalized extremity weakness  - Obtain CT C spine and CT left shoulder with IV contrast  - Consulted PT/OT  - Pain control with Tylenol  PRN and lidocaine  patches; avoid NSAIDs    #  Constitutional symptoms - Fatigue, weight loss, night sweats  - Obtain ESR, PSA, AFP, CEA, CA 19-9    #Subacute diarrhea  - WBC 14.1, mild LLQ and suprapubic TTP on exam  - Follow up stool studies  - Monitor CBC daily    #ESRD (on HD TTS)  - Consulted chronic nephrology for inpatient HD  - Continue Nephrovite daily  - Continue calcium -vitamin D  daily  - Continue cinacalcet  30 mg daily  - Continue aspirin  81 mg daily (no hx of CVA or MI)  - Monitor CBC, BMP, Mg, and Phos daily    #Transaminitis  - AST>ALT, check CK to evaluate for muscle injury  - Monitor HFP daily    #Hypotension  - Continue  fludrocortisone  0.1 mg TID  - Continue midodrine  15 mg TID    #GERD  - Continue omeprazole  40 mg daily    #Allergic rhinitis  - Continue Flonase  daily      F: PO  E: BMP, Mg, and Phos daily  N: Regular diet    Medication Reconciliation: Completed (confirmed with pt)  Bowel Regimen: Hold in the setting of loose stools  DVT Prophylaxis: Heparin   Pain Regimen: Tylenol  PRN  CODE STATUS: Full Code (confirmed with pt)    PT/OT: Orders placed   MRDD: 10/01/23    Scheduled Meds:   aspirin   81 mg Oral Daily    atorvastatin   20 mg Oral Daily with dinner    B complex-vitamin C-folic acid   1 tablet Oral Daily    calcium -vitamin D   1 tablet Oral BID    cinacalcet   30 mg Oral Daily    fludrocortisone   0.1 mg Oral TID    fluticasone   1 spray Nasal Daily    midodrine   15 mg Oral TID    omeprazole   40 mg Oral QAM    Lidocaine   2 patch Transdermal Q24H    heparin  (porcine)  5,000 units Subcutaneous TID     Continuous Infusions:  PRN Meds:.sodium chloride , dextrose , acetaminophen     Case discussed with attending, Dr. Ivery. Please see attestation for final plan.    Shane Rostro, MD, PhD   Internal Medicine, PGY-2  09/29/2023 at 3:36 AM         [1]   Past Medical History:  Diagnosis Date    Arthritis     CHF (congestive heart failure)     Colon polyp     Dialysis patient     ESRD (end stage renal disease)     GERD (gastroesophageal reflux disease)     Hypercholesterolemia     Hypotension     Ischemic colitis     Thyroid disease     hypo   [2]   Past Surgical History:  Procedure Laterality Date    DIALYSIS FISTULA CREATION Bilateral     4 fistulas have failed- ? d/t low BP and small veins    HERNIA REPAIR      Right inguinal hernia repair

## 2023-09-29 NOTE — Progress Notes (Signed)
 HMD Attending Plan of Care Note:    Mr. Vinsant was seen and examined this afternoon. He shares that he has not been feeling well over the last few weeks with progression of left shoulder pain to include both shoulders and development of right then left thigh pain which limits walking.  Additionally, he shares that he developed blurry vision several weeks ago which resolved for three days after getting new glasses but has returned. He also has double vision.  Decreased energy. Notes no change in symptoms over the course of the day.      Exam:   - Lying in stretcher in ED hallway   - Engages with visit,  Interactive   - RRR without m/r/g   - No increased WOB with clear breath sounds   - There is tenderness over the bilateral anterior thighs with 4/5 lower extremity strength     Labs/Imaging:   - Na: 137   - K: 5/1   - CO2: 29   - Albumin: 3.4     - AST: 71   - ALT: 8   - ALK: 238   - GGT: 30   - CK: 67     - WBC: 14.3   - Hgb: 10.1   - Platelets: 507     - ESR: 121    Plans:    Weakness of the proximal upper and lower extremities with blurry vision and binocular vertical diplopia, concern for GCA:   - Appreciate rheumatology consultation    - Urgent ophthalmology consultation   - Check CXR - If effusion present will ask medicine procedure team to do thoracentesis for evaluation of effusion type and presence of malignant cells   - Check head CT   - Check chest CT after thoracentesis if needed.     Remainder as per Dr. Raynold admission note.  Discussed care today with Mariel Barge, PA and Dr. Neoma with the rheumatology service.    Wanda HERO. Tammy, MD   Attending Physician [facility]  09/29/2023 at 3:29 PM

## 2023-09-29 NOTE — Provider Consult (Signed)
 Ophthalmology Consult Note    Patient name: Shane Pearson  DOB: 28-Apr-1960       Age: 63 y.o.  MR#: [id number]    Date: 09/29/2023    Consulting team: Hospital medicine/internal medicine  Reason for consult: Evaluation for GCA    HPI:   63 year old male with ESRD on HD, osteoarthritis, lumbosacral spondylosis, past ocular history of refractive error presenting to the emergency department due to progressive left shoulder pain, myalgia, fatigue, weight loss.    Patient reports approximate 6 to 8-week history of vision changes.  He recently went to his eye doctor and received a new prescription for glasses.  He reports that the glasses improve the vision however a few days later he began to notice intermittent episodes of blurring of the vision bilaterally.  He also has had 2-3 episodes of double vision which resolves when closing 1 eye that have occurred over the last 2 weeks.  He says these episodes of diplopia last approximately 30 to 40 minutes.    Patient has been having several weeks of intermittent headaches.  He describes location as temporal to the right brow and the temple region.  No scalp tenderness.  He has had weakness and pain in the jaw primarily after chewing.  Loss of appetite resulting in approximately 10 to 12 pounds of weight loss over 8 weeks.  Denies any fever or chills.  Patient also has been having significantly increased shoulder pain and upper extremity weakness.  Patient reports chronic history of tinnitus.    No previous eye surgery or trauma.      Current Medications[1]  Allergies: Penicillins, Coconut flavoring agent (non-screening), Amoxicillin, Baclofen, and No known latex allergy     Medical History: Past Medical History[2]     Surgical History: Past Surgical History[3]       Objective:     Base Eye Exam       Visual Acuity (Jaeger Card)         Right Left    Near cc J2 J1+      Correction: Glasses              Tonometry (Tonopen, 3:48 PM)         Right Left    Pressure 17 19               Pupils         Dark Light Shape React APD    Right 3 2 Round Brisk None    Left 3 2 Round Brisk None              Visual Fields (Counting fingers)         Left Right     Full Full              Extraocular Movement         Right Left     Full, Ortho Full, Ortho              Neuro/Psych       Oriented x3: Yes    Mood/Affect: Normal              Dilation       Both eyes: 2.5% Phenylephrine , 1.0% Tropicamide, 0.5% Proparacaine @ 3:48 PM                  Slit Lamp and Fundus Exam       External Exam         Right Left  External Normal ocular adnexae, lacrimal gland & drainage, orbits Normal ocular adnexae, lacrimal gland & drainage, orbits              Slit Lamp Exam         Right Left    Lids/Lashes Normal structure & position Normal structure & position    Conjunctiva/Sclera Normal bulbar/palpebral, conjunctiva, sclera Normal bulbar/palpebral, conjunctiva, sclera    Cornea Arcus Arcus    Anterior Chamber Clear & deep Clear & deep    Iris Normal shape, size, morphology Normal shape, size, morphology    Lens Normal cortex, nucleus, anterior/posterior capsule, clarity Normal cortex, nucleus, anterior/posterior capsule, clarity    Anterior Vitreous Clear Clear              Fundus Exam         Right Left    Disc Normal size, appearance, nerve fiber layer Normal size, appearance, nerve fiber layer    Macula Normal Normal    Vessels Normal Normal    Periphery Normal Normal                          Assessment/Plan:       #GCA suspect  Reports temporal pain, jaw pain and weakness, neck/shoulder stiffness, upper extremity weakness, intermittent blurry vision, and episodic monocular horizontal diplopia. VA 20/30 and 20/20 equivalent on near card with correction, no APD.  No disc edema seen on fundus exam      Recommendations    - Please obtain CRP  - Current plan to hold steroids per rheumatology, for additional malignancy and inflammatory workup  - We will initiate the process for temporal artery biopsy, plan for  evaluation in clinic tomorrow and will consent for biopsy  -Clinic testing tomorrow for Humphrey visual field, OCT RNFL    GCA Management Summary:    Reason for consult:  Assess for giant cell arteritis     Is the patient on steroids? no   Prescribing provider/service: Rheumatology    Temporal artery biopsy requested? yes   Service requesting biopsy including contact information (name and cell/pager): Rheumatology, please see patient summary page for current covering provider as this may have changed since signing.  *Please note; TABs perform on Friday may be resulted on weekend        Please page/call ophthalmology (Eye on call) with any questions or concerns. Thank you for allowing us  to participate in the care of this patient.    SHERRE PRENTICE SQUIBB, MD  Ophthalmology PGY-2  09/29/23 5:26 PM    Patient discussed and plan formulated with ophthalmology attending Dr. Lynn, please see attestation         [1]   Current Facility-Administered Medications:     sodium chloride  0.9 % FLUSH REQUIRED IF PATIENT HAS IV, 0-500 mL/hr, Intravenous, PRN, Jeannetta Cough, MD, PhD    dextrose  5 % FLUSH REQUIRED IF PATIENT HAS IV, 0-500 mL/hr, Intravenous, PRN, Jeannetta Cough, MD, PhD    acetaminophen  (TYLENOL ) tablet 1,000 mg, 1,000 mg, Oral, Q8H PRN, Jeannetta Cough, MD, PhD    aspirin  EC tablet 81 mg, 81 mg, Oral, Daily, Jeannetta Cough, MD, PhD, 81 mg at 09/29/23 9173    atorvastatin  (LIPITOR) tablet 20 mg, 20 mg, Oral, Daily with dinner, Jeannetta Cough, MD, PhD, 20 mg at 09/29/23 1712    B complex-vitamin C-folic acid  (NEPHRO-VITE) tablet 1 tablet, 1 tablet, Oral, Daily, Jeannetta Cough, MD, PhD, 1 tablet at 09/29/23 0825    calcium -vitamin  D (OSCAL-500) 500mg -200unit per tablet 1 tablet, 1 tablet, Oral, BID, Jeannetta Cough, MD, PhD, 1 tablet at 09/29/23 1713    cinacalcet  (SENSIPAR ) tablet 30 mg, 30 mg, Oral, Daily, Jeannetta Cough, MD, PhD, 30 mg at 09/29/23 9175    fludrocortisone  (FLORINEF ) tablet 0.1 mg, 0.1 mg, Oral, TID, Jeannetta Cough, MD, PhD, 0.1 mg at 09/29/23 1714    fluticasone  (FLONASE ) 50 MCG/ACT nasal spray 1 spray, 1 spray, Nasal, Daily, Jeannetta Cough, MD, PhD, 1 spray at 09/29/23 9170    midodrine  (PROAMATINE ) tablet 15 mg, 15 mg, Oral, TID, Jeannetta Cough, MD, PhD, 15 mg at 09/29/23 1712    omeprazole  (PriLOSEC) capsule 40 mg, 40 mg, Oral, QAM, Rice, Matthew, MD, PhD, 40 mg at 09/29/23 0825    Lidocaine  4 % patch 2 patch, 2 patch, Transdermal, Q24H, Jeannetta Cough, MD, PhD, 2 patch at 09/29/23 9676    heparin  (porcine) SQ injection 5,000 units, 5,000 units, Subcutaneous, TID, Jeannetta Cough, MD, PhD, 5,000 units at 09/29/23 1713    Current Outpatient Medications:     midodrine  (PROAMATINE ) 5 mg tablet, Take 3 tablets (15 mg total) by mouth 3 times daily., Disp: 270 tablet, Rfl: 5    magnesium  oxide (MAGNESIUM  OXIDE -MG SUPPLEMENT) 400 (241.3 mg) mg tablet, Take 1 tablet (400 mg total) by mouth daily., Disp: 90 tablet, Rfl: 3    acetaminophen  (TYLENOL ) 325 MG tablet, Take 2 tablets (650 mg total) by mouth every 4-6 hours as needed for Pain., Disp: , Rfl:     aspirin  81 MG EC tablet, Take 1 tablet (81 mg total) by mouth daily., Disp: , Rfl:     fluticasone  (FLONASE ) 50 MCG/ACT nasal spray, Spray 1 spray into nostril daily., Disp: , Rfl:     atorvastatin  (LIPITOR) 10 MG tablet, Take 2 tablets (20 mg total) by mouth daily (with dinner)., Disp: , Rfl:     fludrocortisone  (FLORINEF ) 0.1 mg tablet, Take 1 tablet (0.1 mg total) by mouth 3 times daily., Disp: , Rfl:     b complex-vitamin c-folic acid  (NEPHRO-VITE) tablet, Take 1 tablet by mouth daily., Disp: , Rfl:     calcium -vitamin D  (OSCAL-500) 500-200 MG-UNIT per tablet, Take 1 tablet by mouth 2 times daily., Disp: , Rfl:     cinacalcet  (SENSIPAR ) 30 MG tablet, Take 1 tablet (30 mg total) by mouth daily., Disp: , Rfl:     omeprazole  (PRILOSEC) 20 MG capsule, Take 2 capsules (40 mg total) by mouth daily (before breakfast)., Disp: , Rfl:   [2]   Past Medical History:  Diagnosis Date     Arthritis     CHF (congestive heart failure)     Colon polyp     Dialysis patient     ESRD (end stage renal disease)     GERD (gastroesophageal reflux disease)     Hypercholesterolemia     Hypotension     Ischemic colitis     Thyroid disease     hypo   [3]   Past Surgical History:  Procedure Laterality Date    DIALYSIS FISTULA CREATION Bilateral     4 fistulas have failed- ? d/t low BP and small veins    HERNIA REPAIR      Right inguinal hernia repair

## 2023-09-29 NOTE — Progress Notes (Addendum)
 Brief HMD APP Note     Chart reviewed and patient seen at bedside. C/o pain to bilateral upper extremities L> R with L shoulder pain and pain radiating across back of neck as well. No falls or known trauma. Has also had bilateral lower extremity pain in upper thigh and groin area, now worse on the right side but previously worse on the left. + general malaise and weakness with poor appetite. No headaches or jaw pain. Has had new blurred vision in last two months associated with changes in depth perception. Has been using a crutch to assist with ambulation due to pain.     Vitals:    09/29/23 0041 09/29/23 0153 09/29/23 0419 09/29/23 0823   BP:  (!) 87/60 99/59 105/62   BP Location:  Right arm Right arm Right arm   Pulse:  84 83 87   Resp:  15 18 18    Temp:  37.2 C (99 F) 37.1 C (98.8 F) 36.6 C (97.9 F)   TempSrc:  Temporal Temporal Temporal   SpO2:  96% 100% 100%   Weight: 80.1 kg (176 lb 9.4 oz)          On exam alert, oriented, NAD. CV: regular rate, no murmur appreciated. Lungs: diminished air entry to R base, no crackles or wheezes, easy work of breathing. HDTC site dressing wihtout drainage, no surrounding erythema. No tenderness to posterior cervical region. + tenderness to proximal biceps on LUE. Limited ROM to RUE/LUE with abduction 2/2 pain. R knee, no pain with flexion/extension, no effusion. No pain with log roll of R hip.     Brief updates to plan of care:     L shoulder pain   Bilateral proximal upper and lower extremity pain   - CT L shoulder reviewed, mild arthrosis   - CT C Spine - patchy lucency to C7, unclear significance, can consider MRI   - note Sed Rate 120   - ?PMR - Rheumatology consult appreciated   - PT eval       Blurred vision, diplopia   - d/w HMD attending and Rheumatology, concern for possible CGA, will obtain urgent Ophthalmology consultation for evaluation and temporal artery biopsy. Hold on initiating steroids at this time pending further evaluation     Diarrhea   - stool  studies ordered     ESRD on HD    - Neph consult appreciated   - low Na/Low K/low Phos diet ordered   - HD per Nephrology, TThSat schedule as outpatietn     Elevated Alk Phos   - add on GGT   - CK wnl     Leukocytosis, thrombocytosis   - unclear etiology of inflammation, sed rate elevated as well   - Rheum consult as above   - if develops fever, would check blood and HD cath cultures     DVT ppx: SQ heparin   Dispo: pending PT eval and work up as above     Case discussed with attending physician, see attending note for full detailed plan.     Mariel Barge, PA-C

## 2023-09-29 NOTE — ED Notes (Signed)
 Report Given To  Rachael RN      Descriptive Sentence / Reason for Admission   Endorsing several weeks of generalized body aches, muscle pain, fatigue, loss of appetite, frothy/mucous-like white stools, and unplanned weight loss. Pt also endorses right-sided chest pain. Pt evaluated on 7/7 for similar symptoms including acute chest pain. States that he cannot go to work any longer and is struggling to complete daily activities.       Active Issues / Relevant Events   A/Ox4  RA, ambulatory @ baseline   ESRD, dialysis T/Th/Sat (received dialysis 07/19)  Limb alert for LUE  Stand/pivoted + wheelchaired to bathroom (no stool)  CXR: possible atelectasis/trace pleural effusion   WBC 14.1  0h trop 58 --> 48 --> 49  Chronically hypotensive, provider aware-med  LR bolus  Large right and moderate left pleural effusions         To Do List  VS/A  Meds per MAR  Pain management  Pending stool sample result  Labs  PT/OT eval  Plan MRI cervical spine?      Anticipatory Guidance / Discharge Planning  Admit for failure to thrive.

## 2023-09-30 ENCOUNTER — Inpatient Hospital Stay

## 2023-09-30 ENCOUNTER — Telehealth: Payer: Self-pay

## 2023-09-30 ENCOUNTER — Observation Stay

## 2023-09-30 DIAGNOSIS — M316 Other giant cell arteritis: Secondary | ICD-10-CM

## 2023-09-30 DIAGNOSIS — I959 Hypotension, unspecified: Secondary | ICD-10-CM

## 2023-09-30 DIAGNOSIS — M47817 Spondylosis without myelopathy or radiculopathy, lumbosacral region: Secondary | ICD-10-CM

## 2023-09-30 LAB — ROTAVIRUS A PCR: Rotavirus A PCR: 0

## 2023-09-30 LAB — ENTAMOEBA HISTOLYTICA PCR: E. histolytica PCR: 0

## 2023-09-30 LAB — LACTATE DEHYDROGENASE

## 2023-09-30 LAB — PROTIME-INR
INR: 1.4 — ABNORMAL HIGH (ref 0.9–1.1)
Protime: 15.7 s — ABNORMAL HIGH (ref 10.0–12.9)

## 2023-09-30 LAB — HEPATIC FUNCTION PANEL
ALT: 8 U/L (ref 0–50)
AST: 66 U/L — ABNORMAL HIGH (ref 0–50)
Albumin: 3.4 g/dL — ABNORMAL LOW (ref 3.5–5.2)
Alk Phos: 239 U/L — ABNORMAL HIGH (ref 40–130)
Bilirubin,Direct: <0.2 mg/dL (ref 0.0–0.3)
Bilirubin,Total: 0.3 mg/dL (ref 0.0–1.2)
Total Protein: 6.5 g/dL (ref 6.3–7.7)

## 2023-09-30 LAB — ANA TITER/PATTERN: ANA Titer: 80 — AB

## 2023-09-30 LAB — BASIC METABOLIC PANEL
Anion Gap: 17 — ABNORMAL HIGH (ref 7–16)
CO2: 29 mmol/L — ABNORMAL HIGH (ref 20–28)
Calcium: 9 mg/dL (ref 8.6–10.2)
Chloride: 89 mmol/L — ABNORMAL LOW (ref 96–108)
Creatinine: 10.5 mg/dL — ABNORMAL HIGH (ref 0.67–1.17)
Glucose: 84 mg/dL (ref 60–99)
Lab: 48 mg/dL — ABNORMAL HIGH (ref 6–20)
Potassium: 5.2 mmol/L — ABNORMAL HIGH (ref 3.3–5.1)
Sodium: 135 mmol/L (ref 133–145)
eGFR BY CREAT: 5 — AB

## 2023-09-30 LAB — GIARDIA PCR: Giardia PCR: 0

## 2023-09-30 LAB — SALMONELLA PCR: Salmonella PCR: 0

## 2023-09-30 LAB — CBC AND DIFFERENTIAL
Baso # K/uL: 0.1 THOU/uL (ref 0.0–0.2)
Eos # K/uL: 0.3 THOU/uL (ref 0.0–0.5)
Hematocrit: 37 % (ref 37–52)
Hemoglobin: 10.8 g/dL — ABNORMAL LOW (ref 12.0–17.0)
IMM Granulocytes #: 0.1 THOU/uL — ABNORMAL HIGH
IMM Granulocytes: 1 %
Lymph # K/uL: 1.4 THOU/uL (ref 1.0–5.0)
MCV: 83 fL (ref 75–100)
Mono # K/uL: 1.6 THOU/uL — ABNORMAL HIGH (ref 0.1–1.0)
Neut # K/uL: 11.2 THOU/uL — ABNORMAL HIGH (ref 1.5–6.5)
Nucl RBC # K/uL: 0 THOU/uL (ref 0.0–0.1)
Nucl RBC %: 0 /100{WBCs} (ref 0.0–0.2)
Platelets: 497 THOU/uL — ABNORMAL HIGH (ref 150–450)
RBC: 4.4 MIL/uL (ref 4.0–6.0)
RDW: 18.9 % — ABNORMAL HIGH (ref 0.0–15.0)
Seg Neut %: 76.4 %
WBC: 14.7 THOU/uL — ABNORMAL HIGH (ref 3.5–11.0)

## 2023-09-30 LAB — CAMPYLOBACTER PCR: Campylobacter PCR: 0

## 2023-09-30 LAB — PHOSPHORUS: Phosphorus: 4.6 mg/dL — ABNORMAL HIGH (ref 2.7–4.5)

## 2023-09-30 LAB — SAPOVIRUS PCR: Sapovirus PCR: 0

## 2023-09-30 LAB — CRP: CRP: 338 mg/L — ABNORMAL HIGH (ref 0–8)

## 2023-09-30 LAB — ADENOVIRUS DNA PCR (STOOL ONLY): Adenovirus DNA PCR (stool): 0

## 2023-09-30 LAB — CRYPTOSPORIDIUM PCR: Cryptosporidium PCR: 0

## 2023-09-30 LAB — ANTINUCLEAR ANTIBODY SCREEN: ANA Screen: POSITIVE — AB

## 2023-09-30 LAB — SEDIMENTATION RATE, AUTOMATED: Sedimentation Rate: 121 mm/h — ABNORMAL HIGH (ref 0–20)

## 2023-09-30 LAB — SHIGELLA PCR: Shigella PCR: 0

## 2023-09-30 LAB — MAGNESIUM: Magnesium: 1.8 mg/dL (ref 1.6–2.5)

## 2023-09-30 LAB — NOROVIRUS RNA PCR: Norovirus RNA PCR: 0

## 2023-09-30 LAB — SHIGA TOXIN PCR: Shiga toxin PCR: 0

## 2023-09-30 LAB — UNABLE TO PERFORM ADD-ON TESTING 1

## 2023-09-30 LAB — HUMAN ASTROVIRUS PCR: Human Astrovirus PCR: 0

## 2023-09-30 MED ORDER — LIDOCAINE HCL 1 % IJ SOLN *I*
10.0000 mL | Freq: Once | INTRAMUSCULAR | Status: AC
Start: 2023-09-30 — End: 2023-10-01

## 2023-09-30 MED ORDER — HEPARIN SODIUM (PORCINE) 1000 UNIT/ML IJ SOLN *WRAPPED*
0.0000 mL | Status: DC | PRN
Start: 2023-09-30 — End: 2023-10-10
  Administered 2023-10-01 – 2023-10-10 (×4): 1600 [IU]
  Filled 2023-09-30 (×5): qty 10

## 2023-09-30 MED ORDER — HEPARIN SODIUM (PORCINE) 1000 UNIT/ML IJ SOLN *WRAPPED*
0.0000 mL | Status: DC | PRN
Start: 2023-09-30 — End: 2023-10-10
  Administered 2023-10-01 – 2023-10-10 (×4): 1600 [IU]

## 2023-09-30 NOTE — ED Notes (Signed)
 Report Given To  Kristen , RN      Descriptive Sentence / Reason for Admission   Endorsing several weeks of generalized body aches, muscle pain, fatigue, loss of appetite, frothy/mucous-like white stools, and unplanned weight loss. Pt also endorses right-sided chest pain. Pt evaluated on 7/7 for similar symptoms including acute chest pain. States that he cannot go to work any longer and is struggling to complete daily activities.       Active Issues / Relevant Events   FULL CODE   A&O x4  RA, ambulatory w/ cane @ baseline   ESRD, dialysis T/Th/Sat (received dialysis 07/19)  Stand/pivoted + wheelchaired to bathroom  WBC 14.1  Chronically hypotensive--> midodrine    Large right and moderate left pleural effusions       To Do List  Meds per [facility]  VS/A Q4H  Pain management  Labs  PT/OT eval  LP 7/22 [place]ido ordered for 09:00      Anticipatory Guidance / Discharge Planning  Admit for diarrhea

## 2023-09-30 NOTE — Progress Notes (Signed)
 Dialysis progress note 09/30/2023 11:48 AM    Subjective:  No acute events overnight. Patient seen and examined in room. He just returned from eye clinic. He reports felling sl SOB. Inquiring about dry weight. He feels it is not correct. Chest x-ray notable for small bilateral pleural effusions.   Will weight patient prior to his dialysis session to obtain current weight and challenge his dry weight in order to obtain accurate weight.   Had R temporal artery biopsy today.       Objective:  Vitals:    09/30/23 0000 09/30/23 0308 09/30/23 0718 09/30/23 1126   BP: 93/60 91/57  100/65   BP Location:  Right arm  Right arm   Pulse:       Resp:  15 16 18    Temp:  37.1 C (98.7 F) 36.3 C (97.3 F) 36.3 C (97.3 F)   TempSrc:  Oral Temporal Temporal   SpO2: 91% 94%     Weight:         Intake/Output last 3 shifts:No intake/output data recorded.    Hzw:Jtjxz and alert, in NAD, lying in bed   Eyes: R eye injected, glasses   Mouth: Moist oral mucosa  CVS: RRR, no rub, no gallop, no murmur   Lungs: Normal WOB. Clear anterolaterally   Abd: Soft non tender non distended  Ext: There is + edema noted   HD cath LIJ with CDI dressing   Neuro: Awake Alert, oriented x 3, interactive. No focal deficits noted       Recent Labs   Lab 09/30/23  0625 09/29/23  0327 09/28/23  1926   Sodium 135 137 139   Potassium 5.2* 5.1 4.9   Chloride 89* 90* 92*   CO2 29* 29* 29*   UN 48* 26* 21*   Creatinine 10.50* 7.38* 6.48*   Glucose 84 81 90   Calcium  9.0 9.1 9.3   Albumin 3.4* 3.4* 3.6   Phosphorus 4.6* 3.9  --      No results found for: PTH, VID25 Recent Labs   Lab 09/30/23  0625 09/29/23  0327 09/28/23  1926   WBC 14.7* 14.3* 14.1*   Hemoglobin 10.8* 10.1* 10.7*   Hematocrit 37 35* 36*   Platelets 497* 507* 506*         Component Value Date/Time    FER 650 (H) 09/29/2023 0327        CT chest without contrast  Result Date: 09/29/2023  Moderate right and small left layering pleural fluid, with small fluid tracking along the perifissural regions  of the lingula and right lower lobe. This results in atelectasis of the right greater than left lower lobe. Multiple enlarged mediastinal lymph nodes of indeterminate etiology. PET/CT could be considered if clinically appropriate. Follow-up chest CT after resolution of acute symptomology or 8 to 12 weeks is also advised. Scattered pulmonary nodules measuring up to 4 mm, again of indeterminate clinical significance. These can be reassessed for at the time of follow-up imaging per clinical guidance, or at the time of recommended follow-up chest imaging as above. Right chest wall subcutaneous soft tissue density nodules may represent enlarged lymph nodes. Incidental polycystic configuration of the bilateral kidneys. END OF IMPRESSION I have personally reviewed the images and the Resident's/Fellow's interpretation and agree with or edited the findings.     *Chest standard frontal and lateral views  Result Date: 09/29/2023  Persistent small bilateral pleural effusions with presumed adjacent parenchymal atelectasis, slightly increased.. END OF IMPRESSION I have  personally reviewed the images and the Resident's/Fellow's interpretation and agree with or edited the findings.     CT cervical spine without contrast  Result Date: 09/29/2023  Renal osteodystrophy. Asymmetric patchy lucency within the C7 vertebral body extending into the left transverse process and left facet. There is also asymmetric soft tissue prominence in this area compared to the right side. This appears atypical for fracture, however given the patient's left-sided radicular symptoms a cervical spine MRI may be of value to further assess this finding. No obvious spinal canal or significant neuroforaminal narrowing by CT. Large right and moderate left pleural effusions, radiographically occult on prior chest x-ray. This may be related to volume overload in the setting of ESRD. END OF IMPRESSION I have personally reviewed the images and the Resident's/Fellow's  interpretation and agree with or edited the findings.     CT head without contrast  Result Date: 09/29/2023  No CT evidence of acute intracranial abnormality. Suspected odontogenic related sinus disease in setting of adjacent periapical lucencies of the maxillary molars. Recommend correlation with dental evaluation. END OF IMPRESSION     *Chest standard frontal and lateral views  Result Date: 09/28/2023  Right basilar airspace opacity, probably atelectasis in setting of low lung volumes. However, infection or aspiration could have a similar imaging appearance. Minimal blunting of the right costophrenic angle, probably representing a trace pleural effusion. END OF IMPRESSION I have personally reviewed the images and the Resident's/Fellow's interpretation and agree with or edited the findings.            Assessment/Plan  Shane Pearson is a 63 y.o. Male with history significant for ESRD (on HD TTS), hypotension, and lumbosacral spondylosis who presented to the ED for pain and difficulty walking. Nephrology consulted for dialysis needs.     ESRD on HD TTC @ [place] [facility]:  - There is metabolic or volume related indication for HD today; Plan next HD tomorrow per schedule   - Follow chemistries   - Renally dose medications for CrCl < 10 ml/min     Anemia: Goal hemoglobin 10-11 g/dl   - Hgb 89.1   - No ESA indicated at this time   - Follow CBC   - On IV iron 100 mg with HD as outpatient. Will check iron stores     CKD-MBD:      - Calcium  is acceptable   - Mildly elevated phosphorus , 4.6   - Calcium -Vitamin D  1 tab BID   - Cinacalcet  30 mg daily     Hemodynamics:   - SBPs 91-100  - Midodrine  15 mg TID   -  Florinef  0.1 mg TID     Electrolytes:   - Mild hyperkalemia 5.2   - Follow chemistry     Access:   - Left IJ HDTC     Rest of care per primary team.     Thank you for allowing us  to participate in the care of this patient, Please call with questions.     Author: Elige FORBES Schultze, NP  as of: 09/30/2023  at: 11:48 AM  Pager  [phone]

## 2023-09-30 NOTE — ED Notes (Signed)
 Report Given To  Ed, RN      Descriptive Sentence / Reason for Admission   Endorsing several weeks of generalized body aches, muscle pain, fatigue, loss of appetite, frothy/mucous-like white stools, and unplanned weight loss. Pt also endorses right-sided chest pain. Pt evaluated on 7/7 for similar symptoms including acute chest pain. States that he cannot go to work any longer and is struggling to complete daily activities.       Active Issues / Relevant Events   FULL CODE   A&O x4  RA, ambulatory w/ cane @ baseline   ESRD, dialysis T/Th/Sat (received dialysis 07/19)  Stand/pivoted + wheelchaired to bathroom  WBC 14.1  Chronically hypotensive--> midodrine    Large right and moderate left pleural effusions       To Do List  Meds per [facility]  VS/A Q4H  Pain management  Labs  PT/OT eval      Anticipatory Guidance / Discharge Planning  Admit for diarrhea

## 2023-09-30 NOTE — Invasive Procedure Plan of Care (Signed)
 CONSENT FOR MEDICAL  OR SURGICAL PROCEDURE                            Patient Name: Shane Pearson  [facility] [address] MR                                                              DOB: 08-01-1960         Please read this form or have someone read it to you.   It's important to understand all parts of this form. If something isn't clear, ask us  to explain.   When you sign it, that means you understand the form and give us  permission to do this surgery or procedure.     I agree for Lynn Hanks, Natalie, MD along with any assistants* they may choose, to treat the following condition(s): This is done to rule out a form of vasculitis that can cause blindness called giant cell arteritis   By doing this surgery or procedure on me: Biopsy of the temporal artery, we will  take a sample of one of the arteries along the forehead to check for inflammation.    This is also known as: Temporal artery biopsy   Laterality: Right     *if you'd like a list of the assistants, please ask. We can give that to you.    1. The care provider has explained my condition to me. They have told me how the procedure can help me. They have told me about other ways of treating my condition. I understand the care provider cannot guarantee the result of the procedure. If I Shane't have this procedure, my other choices are: The utility of less invasive diagnostic alternatives to TAB for the diagnosis of GCA remains an open debate.18 EULAR 2018 guidelines suggests that the diagnosis of GCA should be confirmed by imaging or histology.27 Doppler ultrasound (DUS), computed tomography angiography CTA), magnetic resonance angiography (MRA), and 60F-fluorodeoxyglucose positron-emission tomography/computed-tomography (18 FDG-PET/CT) are the most frequently used alternative procedures. However, the temporal artery biopsy is the gold standard for this diagnosis.     2. The care provider has told me the risks (problems that can happen) of the procedure. I understand  there may be unwanted results. The risks that are related to this procedure include: The most frequent complication would be failure to find the artery needing to repeat the biopsy on a second attempt on the same or contralateral side. More serious complications, although they are extremely rare, have also been reported. One such complication includes temporary or permanent paralysis of the temporal branch of the facial nerve, which would be exceedingly rare when biopsying the anterior branch of the temporal artery bifurcation as planned. Other local complications such as injury to the parotid gland, hematoma of the operative site, local infection or sepsis, superinfection of the wound, and suture dehiscence may also occur. Cases of necrosis of the scalp have also been reported, but these cases are exceptional and may be due to the underlying disease rather than the actual biopsy procedure. Strokes from lack of sufficient collaterality are another potential complication, but are also exceptionally rare.    3. I understand that during the procedure, my care provider may find a condition that we  didn't know about before the treatment started. Therefore, I agree that my care provider can perform any other treatment which they think is necessary and available.    4. I give permission to the hospital and/or its departments to examine and keep tissue, blood, body parts, fluids or materials removed from my body during the procedure(s) to aid in diagnosis and treatment, after which they may be used for scientific research or teaching by appropriate persons. If these materials are used for science or teaching, my identity will be protected. I will no longer own or have any rights to these materials regardless of how they may be used.    5. My care provider might want a representative from a medical device company to be there during my procedure. I understand that person works for:          The ways they might help my care  provider during my procedure include:            6. Here are my decisions about receiving blood, blood products, or tissues. I understand my decisions cover the time before, during and after my procedure, my treatment, and my time in the hospital. After my procedure, if my condition changes a lot, my care provider will talk with me again about receiving blood or blood products. At that time, my care provider might need me to review and sign another consent form, about getting or refusing blood.    I understand that the blood is from the community blood supply. Volunteers donated the blood, the volunteers were screened for health problems. The blood was examined with very sensitive and accurate tests to look for hepatitis, HIV/AIDS, and other diseases. Before I receive blood, it is tested again to make sure it is the correct type.    My chances of getting a sickness from blood products are small. But no transfusion is 100% safe. I understand that my care provider feels the good I will receive from the blood is greater than the chances of something going wrong. My care provider has answered my questions about blood products.      My decision  about blood or  blood products   Not applicable.        My decision   about tissue  Implants     Not applicable.          I understand this  form.    My care provider  or his/her  assistants have  explained:   What I am having done and why I need it.  What other choices I can make instead of having this done.  The benefits and possible risks (problems) to me of having this done.  The benefits and possible risks (problems) to me of receiving transplants, blood, or blood products.  There is no guarantee of the results.  The care provider may not stay with me the entire time that I am in the operating or procedure room.  My provider has explained how this may affect my procedure. My provider has answered my  questions about this.         I give my  permission for  this surgery  or  procedure.            _________________________________________                                     My  signature  (or parent or other person authorized to sign for you, if you are unable to sign for yourself or if you are under 41 years old)             _____________     Date      (MM/DD/YYYY)           _______    Time      Electronic Signatures will display at the bottom of the consent form.    Care provider's statement: I have discussed the planned procedure, including the possibility for transfusion of blood  products or receipt of tissue as necessary; expected benefits; the possible complications and risks; and possible alternatives  and their benefits and risks with the patients or the patient's surrogate. In my opinion, the patient or the patient's surrogate  understands the proposed procedure, its risks, benefits and alternatives.              Electronically signed by: Catalina Demark, MD                                                09/30/2023         Date        10:19 AM        Time

## 2023-09-30 NOTE — Plan of Care (Signed)
 Medicine Procedure Team Note    Noted order for bedside lumbar puncture. Chart reviewed.     Upon the chart review we noted the most recent INR was in 2023. We would like to see an uopdated value before performing the procedure. Due to the time of day and the lab has not been sent yet, we will hold off till the am.    Placed order for 1% lidocaine . Please pull from pyxis prior to procedure and we will plan to draw up once sterile.       Zachary JONETTA Gilles, NP  Hospital Medicine APP Procedure Team   09/30/23 4:02 PM    Please page PIC [phone] with any questions/concerns.

## 2023-09-30 NOTE — Progress Notes (Signed)
Patient is scheduled for hemodialysis 1st shift tomorrow morning. Transporters will arrive for patient between 0700-0800. Please obtain an accurate weight prior to pick up, ensure the patient is ready for transport, and administer any pre-dialysis medications. Also, please make sure patient is on HoverMatt prior to coming to Dialysis. If patient is to receive MIDODRINE please ensure dose has been administered prior to the patient leaving for HD. Please consult pharmacy if you are unsure of medications that should be held prior to HD.     Please weigh patient daily for accurate fluid monitoring.    Thank you.    Aylana Hirschfeld, RN

## 2023-09-30 NOTE — ED Notes (Signed)
 Report Given To  Kristen , RN      Descriptive Sentence / Reason for Admission   Endorsing several weeks of generalized body aches, muscle pain, fatigue, loss of appetite, frothy/mucous-like white stools, and unplanned weight loss. Pt also endorses right-sided chest pain. Pt evaluated on 7/7 for similar symptoms including acute chest pain. States that he cannot go to work any longer and is struggling to complete daily activities.       Active Issues / Relevant Events   FULL CODE   A&O x4  RA, ambulatory w/ cane @ baseline   ESRD, dialysis T/Th/Sat (received dialysis 07/19)  Stand/pivoted + wheelchaired to bathroom  WBC 14.1  Chronically hypotensive--> midodrine    Large right and moderate left pleural effusions       To Do List  Meds per [facility]  VS/A Q4H  Pain management  Labs  PT/OT eval      Anticipatory Guidance / Discharge Planning  Admit for diarrhea

## 2023-09-30 NOTE — Provider Consult (Signed)
 Ophthalmology Consult Note    Patient name: Shane Pearson  DOB: [birth year]       Age: 63 y.o.  MR#: [id number]    Date: 09/30/2023    Consulting team: Hospital medicine/internal medicine  Reason for consult: Evaluation for GCA    HPI:   63 year old male with ESRD on HD, osteoarthritis, lumbosacral spondylosis, past ocular history of refractive error presenting to the emergency department due to progressive left shoulder pain, myalgia, fatigue, weight loss.    Patient reports approximate 6 to 8-week history of vision changes.  He recently went to his eye doctor and received a new prescription for glasses.  He reports that the glasses improve the vision however a few days later he began to notice intermittent episodes of blurring of the vision bilaterally.  He also has had 2-3 episodes of double vision which resolves when closing 1 eye that have occurred over the last 2 weeks.  He says these episodes of diplopia last approximately 30 to 40 minutes.    Patient has been having several weeks of intermittent headaches.  He describes location as temporal to the right brow and the temple region.  No scalp tenderness.  He has had weakness and pain in the jaw primarily after chewing.  Loss of appetite resulting in approximately 10 to 12 pounds of weight loss over 8 weeks.  Denies any fever or chills.  Patient also has been having significantly increased shoulder pain and upper extremity weakness.  Patient reports chronic history of tinnitus.    Update 7/21  -Patient seen in clinic today, now saying that he does not have R temple pain but that he gets headaches occasionally. Does not feel like he has one now. Does endorse above history of unexplained weight loss and myalgias. States he still has some double vision that splits upwards. No other concerns to share.    No previous eye surgery or trauma.      Current Medications[1]  Allergies: Penicillins, Coconut flavoring agent (non-screening), Amoxicillin, Baclofen, and No  known latex allergy     Medical History: Past Medical History[2]     Surgical History: Past Surgical History[3]       Objective:     Base Eye Exam       Visual Acuity (Jaeger Card)         Right Left    Dist cc 20/25 20/25    Dist ph cc NI NI    Near cc J2 J1+      Correction: Glasses              Tonometry (Tonopen, 3:48 PM)         Right Left    Pressure 17 19              Pupils         Dark Light Shape React APD    Right 3 2 Round Brisk None    Left 3 2 Round Brisk None              Visual Fields (Counting fingers)         Left Right     Full Full              Extraocular Movement         Right Left     Full, Ortho Full, Ortho              Neuro/Psych       Oriented x3: Yes  Mood/Affect: Normal              Dilation       Both eyes: 2.5% Phenylephrine , 1.0% Tropicamide, 0.5% Proparacaine @ 3:48 PM                  Slit Lamp and Fundus Exam       External Exam         Right Left    External Normal ocular adnexae, lacrimal gland & drainage, orbits Normal ocular adnexae, lacrimal gland & drainage, orbits              Slit Lamp Exam         Right Left    Lids/Lashes Normal structure & position Normal structure & position    Conjunctiva/Sclera Normal bulbar/palpebral, conjunctiva, sclera Normal bulbar/palpebral, conjunctiva, sclera    Cornea Arcus Arcus    Anterior Chamber Clear & deep Clear & deep    Iris Normal shape, size, morphology Normal shape, size, morphology    Lens 1+ Nuclear sclerosis 1+ Nuclear sclerosis    Anterior Vitreous Clear Clear              Fundus Exam         Right Left    Disc Normal size, appearance, nerve fiber layer Normal size, appearance, nerve fiber layer    C/D Ratio 0.75 0.6    Macula Normal Normal    Vessels Normal Normal                  Refraction       Manifest Refraction         Sphere Cylinder Axis Add    Right -4.50 +3.50 178 +2.25    Left -4.75 +4.50 176 +2.25                      Assessment/Plan:     #GCA suspect  #Glaucoma suspect   Distance visual acuity 20/25 in both eyes,  EOMs full, no APD on exam, and full color vision. Dilated exam not showing signs of optic disc edema or pallor. Patient with 4 diopters of right eye hypertropia (likely decompensated) that improved with base down prism . OCT RNFL without frank thickening of optic nerve though noted to have tilted optic discs, some thinning of right eye macula ganglion cell layer may be in the setting of glaucoma. HVF completed with mild inf nasal scotomas in right eye and  crescent inf-nasal scotomas in left eye (though unreliable test due to high fixation loss). Clinically, there is a lower suspicion of GCA however will proceed with TAB per primary and rheumatology request. Patient consented in clinic and planning for biopsy 7/25.    Recommendations    - Current plan to hold steroids per rheumatology, for additional malignancy and inflammatory workup  - TAB tentatively 7/25, please ensure patient is NPO prior to procedure.    GCA Management Summary:    Reason for consult:  Assess for giant cell arteritis     Is the patient on steroids? no   Prescribing provider/service: Rheumatology    Temporal artery biopsy requested? yes   Service requesting biopsy including contact information (name and cell/pager): Rheumatology, please see patient summary page for current covering provider as this may have changed since signing.  *Please note; TABs perform on Friday may be resulted on weekend        Please page/call ophthalmology (Eye on call) with any questions or concerns. Thank you  for allowing us  to participate in the care of this patient.    Catalina Demark, MD  Ophthalmology PGY-2  09/30/23 12:36 PM    Patient seen, discussed, and plan formulated with ophthalmology attending Dr. Lynn, please see attestation       [1]   Current Facility-Administered Medications:     sodium chloride  0.9 % FLUSH REQUIRED IF PATIENT HAS IV, 0-500 mL/hr, Intravenous, PRN, Jeannetta Cough, MD, PhD    dextrose  5 % FLUSH REQUIRED IF PATIENT HAS IV, 0-500  mL/hr, Intravenous, PRN, Jeannetta Cough, MD, PhD    acetaminophen  (TYLENOL ) tablet 1,000 mg, 1,000 mg, Oral, Q8H PRN, Jeannetta Cough, MD, PhD, 1,000 mg at 09/29/23 2121    aspirin  EC tablet 81 mg, 81 mg, Oral, Daily, Jeannetta Cough, MD, PhD, 81 mg at 09/30/23 9187    atorvastatin  (LIPITOR) tablet 20 mg, 20 mg, Oral, Daily with dinner, Jeannetta Cough, MD, PhD, 20 mg at 09/29/23 1712    B complex-vitamin C-folic acid  (NEPHRO-VITE) tablet 1 tablet, 1 tablet, Oral, Daily, Jeannetta Cough, MD, PhD, 1 tablet at 09/30/23 0813    calcium -vitamin D  (OSCAL-500) 500mg -200unit per tablet 1 tablet, 1 tablet, Oral, BID, Jeannetta Cough, MD, PhD, 1 tablet at 09/30/23 9187    cinacalcet  (SENSIPAR ) tablet 30 mg, 30 mg, Oral, Daily, Jeannetta Cough, MD, PhD, 30 mg at 09/30/23 9187    fludrocortisone  (FLORINEF ) tablet 0.1 mg, 0.1 mg, Oral, TID, Jeannetta Cough, MD, PhD, 0.1 mg at 09/30/23 0813    fluticasone  (FLONASE ) 50 MCG/ACT nasal spray 1 spray, 1 spray, Nasal, Daily, Jeannetta Cough, MD, PhD, 1 spray at 09/30/23 0813    midodrine  (PROAMATINE ) tablet 15 mg, 15 mg, Oral, TID, Jeannetta Cough, MD, PhD, 15 mg at 09/30/23 9187    omeprazole  (PriLOSEC) capsule 40 mg, 40 mg, Oral, QAM, Rice, Matthew, MD, PhD, 40 mg at 09/30/23 9187    Lidocaine  4 % patch 2 patch, 2 patch, Transdermal, Q24H, Jeannetta Cough, MD, PhD, 2 patch at 09/29/23 9676    heparin  (porcine) SQ injection 5,000 units, 5,000 units, Subcutaneous, TID, Jeannetta Cough, MD, PhD, 5,000 units at 09/30/23 9187    Current Outpatient Medications:     midodrine  (PROAMATINE ) 5 mg tablet, Take 3 tablets (15 mg total) by mouth 3 times daily., Disp: 270 tablet, Rfl: 5    magnesium  oxide (MAGNESIUM  OXIDE -MG SUPPLEMENT) 400 (241.3 mg) mg tablet, Take 1 tablet (400 mg total) by mouth daily., Disp: 90 tablet, Rfl: 3    acetaminophen  (TYLENOL ) 325 MG tablet, Take 2 tablets (650 mg total) by mouth every 4-6 hours as needed for Pain., Disp: , Rfl:     aspirin  81 MG EC tablet, Take 1 tablet (81 mg total) by  mouth daily., Disp: , Rfl:     fluticasone  (FLONASE ) 50 MCG/ACT nasal spray, Spray 1 spray into nostril daily., Disp: , Rfl:     atorvastatin  (LIPITOR) 10 MG tablet, Take 2 tablets (20 mg total) by mouth daily (with dinner)., Disp: , Rfl:     fludrocortisone  (FLORINEF ) 0.1 mg tablet, Take 1 tablet (0.1 mg total) by mouth 3 times daily., Disp: , Rfl:     b complex-vitamin c-folic acid  (NEPHRO-VITE) tablet, Take 1 tablet by mouth daily., Disp: , Rfl:     calcium -vitamin D  (OSCAL-500) 500-200 MG-UNIT per tablet, Take 1 tablet by mouth 2 times daily., Disp: , Rfl:     cinacalcet  (SENSIPAR ) 30 MG tablet, Take 1 tablet (30 mg total) by mouth daily., Disp: , Rfl:  omeprazole  (PRILOSEC) 20 MG capsule, Take 2 capsules (40 mg total) by mouth daily (before breakfast)., Disp: , Rfl:   [2]   Past Medical History:  Diagnosis Date    Arthritis     CHF (congestive heart failure)     Colon polyp     Dialysis patient     ESRD (end stage renal disease)     GERD (gastroesophageal reflux disease)     Hypercholesterolemia     Hypotension     Ischemic colitis     Thyroid disease     hypo   [3]   Past Surgical History:  Procedure Laterality Date    DIALYSIS FISTULA CREATION Bilateral     4 fistulas have failed- ? d/t low BP and small veins    HERNIA REPAIR      Right inguinal hernia repair

## 2023-09-30 NOTE — Discharge Instructions (Addendum)
 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 There was initial concern for infectious issues. You were further evaluated with blood tests and imaging. During your stay, the Nephrology, Rheumatology, Ophthalmology, neurosurgery, and oncology teams assisted with diagnosis and treatment. Imaging raised concern for spread of cancer to the bones or for infection of the bone. The area was sampled. You were treated with antibiotics after the sample was taken in case it was from infection. Testing showed that the spots were cancer that spread from an original kidney cancer. You had testing for blurred vision done while in the hospital. Your other chronic conditions were managed and you were deemed stable for discharge    Anticipatory guidance:      Take medications as prescribed     Follow-up with Dr. Juliene for hospital follow-up     Follow-up with Dr. Bronson next week for cancer follow-up     Follow-up with the eye doctors tomorrow    Recommended diet: Low Potassium    Recommended activity: activity as tolerated with rolling walker     If you experience any of these symptoms 24 hours or more after discharge: Uncontrolled pain, chest pain, Shortness of breath, Fever of 101 F. or greater, chills, poor appetite, poor urinary output, vomiting, nausea, diarrhea, blood in stool or weakness, call Dr. Juliene, Debby PARAS, MD. If at any time you have any questions or concerns or your condition gets worse, contact your physician.  If you can not reach your physician or you develop life threatening symptoms such as trouble breathing or chest pain you should go to the closest Emergency Department.     If you experience any of these symptoms within the first 24 hours after discharge call Dr. Tobie, [facility] at [phone].

## 2023-09-30 NOTE — ED Notes (Signed)
 Report Given To  Handoff completed on 09/30/2023 at 10:12 PM.   Pt to travel in hospital bed.    Lalla Laham H., RN       Descriptive Sentence / Reason for Admission   Presents for intermittent (L) shoulder pain x 2 weeks. Inability to ambulate x 1 week r/t (R) leg pain. + muscle aches + fatigue + weight loss + night sweats + poor PO intake    PMHX: ESRD HD (T,Th,S) Osteoarthritis, Hypotension      Active Issues / Relevant Events   Full Code   A&O x4  Ambulatory w/ crutches   Hypotensive @ baseline  Dialysis (T/Th/Sat) (received dialysis 07/19)-- scheduled for 7/22  WBC 14.1  BNP 1900  CT Chest- Moderate right and small left layering pleural fluid, with small fluid tracking along the perifissural regions of the lingula and right lower lobe. Multiple enlarged mediastinal lymph nodes. Scattered pulmonary nodules measuring up to 4 mm.   CT Cervical Spine- Asymmetric patchy lucency within the C7 vertebral body extending into the left transverse process and left facet. There is also asymmetric soft tissue prominence in this area compared to the right side. This appears atypical for fracture, however given the patient's left-sided radicular symptoms a cervical spine MRI may be of value to further assess this finding.        To Do List  VS/Assess Q4H  Meds per [facility]  Temporal Artery Biopsy (7/25)  Lumbar Puncture (7/22)   Thoracentesis   Diet Low NA,K, Phosphorus   NPO 7/25 0000  PT/OT Eval      Anticipatory Guidance / Discharge Planning  Admit for diarrhea

## 2023-09-30 NOTE — Progress Notes (Signed)
 Hospital Medicine Service Attending Progress Note    Significant Events/Subjective:   No acute events overnight. Seen in late afternoon after return from ophtho clinic. Pt continues to note double vision though no worsening since yesterday. Has been informed by ophthalmology that temporal artery biopsy would occur on Friday. I reviewed yesterday's imaging results with him noting pleural effusions and CT cervical spine findings. He notes consistent dyspnea since prior to admission. No sputum production or worsening cough. Regarding pain, endorses severe, electric pain down left arm exacerbated by most movement of his arm and neck. Pain at right arm is more achy in nature    Objective:     Physical Exam  Temp:  [36.3 C (97.3 F)-38.4 C (101.1 F)] 36.3 C (97.3 F)  Heart Rate:  [76-88] 80  Resp:  [14-20] 16  BP: (88-109)/(57-64) 91/57  Wt:      Constitutional: Adult man in no acute distress   Eyes: EOMI, pupils equal, round, reactive to light  ENT: Oral mucosa moist  CV: RRR, no M/R/G, no LE edema  Resp: Normal WOB on room air, CTAB  GI: Soft, nontender, nondistended  Skin: Warm, dry  Neuro: Awake, alert, oriented, conversational. Weakness noted at L > R arm    Recent Lab, Microbiology, Imaging Studies:  Personally reviewed and notable for:  Recent Labs   Lab 09/30/23  0625 09/29/23  0327 09/28/23  1926   WBC 14.7* 14.3* 14.1*   Hemoglobin 10.8* 10.1* 10.7*   Hematocrit 37 35* 36*   Platelets 497* 507* 506*      Recent Labs   Lab 09/30/23  0625 09/29/23  0327 09/28/23  1926   Sodium 135 137 139   Potassium 5.2* 5.1 4.9   Chloride 89* 90* 92*   CO2 29* 29* 29*   UN 48* 26* 21*   Creatinine 10.50* 7.38* 6.48*   Glucose 84 81 90   Calcium  9.0 9.1 9.3   Albumin 3.4* 3.4* 3.6   Phosphorus 4.6* 3.9  --      No results for input(s): APTT, INR, PTT, PTI in the last 168 hours.    Ferritin 650  ANA positive  Titer 80 , nucleolar pattern     CT chest w/o contrast (09/29/2023):     Moderate right and small left  layering pleural fluid, with small fluid tracking along the perifissural regions of the lingula and right lower lobe. This results in atelectasis of the right greater than left lower lobe.      Multiple enlarged mediastinal lymph nodes of indeterminate etiology. PET/CT could be considered if clinically appropriate. Follow-up chest CT after resolution of acute symptomology or 8 to 12 weeks is also advised.      Scattered pulmonary nodules measuring up to 4 mm, again of indeterminate clinical significance. These can be reassessed for at the time of follow-up imaging per clinical guidance, or at the time of recommended follow-up chest imaging as above.      Right chest wall subcutaneous soft tissue density nodules may represent enlarged lymph nodes.      Incidental polycystic configuration of the bilateral kidneys.      END OF IMPRESSION     CT head w/o contrast (09/29/2023):  No CT evidence of acute intracranial abnormality.      Suspected odontogenic related sinus disease in setting of adjacent periapical lucencies of the maxillary molars. Recommend correlation with dental evaluation.      END OF IMPRESSION     CT cervical spine  w/o contrast (09/29/2023):     Renal osteodystrophy.      Asymmetric patchy lucency within the C7 vertebral body extending into the left transverse process and left facet. There is also asymmetric soft tissue prominence in this area compared to the right side. This appears atypical for fracture, however given   the patient's left-sided radicular symptoms a cervical spine MRI may be of value to further assess this finding.      No obvious spinal canal or significant neuroforaminal narrowing by CT.      Large right and moderate left pleural effusions, radiographically occult on prior chest x-ray. This may be related to volume overload in the setting of ESRD.      END OF IMPRESSION       Assessment/Plan:   Shane Pearson is a 63 y.o. man with ESRD (HD T/Th/Sat via left chest tunneled catheter,  anuric), left shoulder OA, and lumbosacral spondylosis initially admitted to ED for chest and left shoulder pain. Initial workup without evidence fracture, dislocation, or other joint/tendinous abnormality. Due to progressive blurry vision, weakness, and reduced ROM, concern for giant cell arteritis raised, and rheumatology was consulted with recommendation for temporal artery biopsy. Ophthalmology also consulted for further assessment.    #Left > Right shoulder pain: CT C-spine with asymmetric soft tissue findings; left > right side. Potentially could be impingement resulting in radicular symptoms  - Will obtain MRI cervical spine with contrast for further assessment (will need to coordinate with dialysis)  - PT/OT  - Symptom management with acetaminophen , topical adjuncts    #Blurred vision  #Proximal upper/lower extremity weakness: Workup for giant cell arteritis  - Rheumatology and ophthalmology consulted  - Tentative plan for Friday 7/25 biopsy  - Currently holding systemic steroids (malignancy rule out)    #Constitutional symptoms  #Bilateral pleural effusions: Given patient's report of fatigue, weight loss, and night sweats will further investigate for infectious vs. neoplastic vs. rheumatologic etiology  - CRP 365 -> 338  ESR 121  - AFP <4  CEA 5.2  CA 19-9 19  - PSA 0.22  - Trend ESR/CRP Q48H  - Will pursue thoracentesis for further assessment; medicine procedure team consulted   - Pleural LDH, protein, cell count, cytology   - Pleural fluid cultures     #Subacute diarrhea  - WBC 14.1, mild LLQ and suprapubic TTP on exam  - Stool studies negative  - Monitor CBC daily    Chronic Medical Issues  #ESRD (on HD TTS)  - Consulted chronic nephrology for inpatient HD  - Continue Nephrovite daily  - Continue calcium -vitamin D  daily  - Continue cinacalcet  30 mg daily  - Continue aspirin  81 mg daily (no hx of CVA or MI)  - Monitor CBC, BMP, Mg, and Phos daily     #Transaminitis  - AST>ALT, check CK to evaluate for  muscle injury  - Monitor HFP daily     #Hypotension  - Continue fludrocortisone  0.1 mg TID  - Continue midodrine  15 mg TID     #GERD  - Continue omeprazole  40 mg daily     #Allergic rhinitis  - Continue Flonase  daily    BMI: Body mass index is 30.31 kg/m.  Nutrition: Low sodium  DVT Prophylaxis: SQH  PUD Prophylaxis: Omeprazole   Bowel: N/a  Invasive lines/drains/tubes: HD catheter, pIV    Emergency Contact: [name] (spouse/HCP) [phone]   Code Status:Full Code    Discharge Planning:   Medically Ready for Discharge: 10/01/2023    Active Discharge Delays:   {      [  x]Reviewed today 09/30/23    PT/OT recommendation:     Date of PT/OT recommendation last updated:     Appointments Needed with: TBD     Case discussed with HMD APP, interdisciplinary team.    Cherre Marsa Lesches, MD on 09/30/2023 at 8:50 AM  Hospital Medicine    I personally spent 50 minutes on the calendar day of the encounter, including pre and post visit work.

## 2023-09-30 NOTE — Telephone Encounter (Addendum)
 Spoke with patient and scheduled TAB with Dr. Lynn on 10/04/2023 at the [facility] location. Surgical process reviewed and letter to follow.     Dr. Lynn is managing the TAB results.    Referring provider:    Heather Gulling, MD  Phone # [phone]  Email - [email or web address]

## 2023-09-30 NOTE — Telephone Encounter (Signed)
 Please see the attached informative eye surgical letter.  Letter emailed today. Email me or call me if you have any questions.        Regarding: Shane Pearson, Shane Pearson    Your eye surgery has been scheduled with Dr. Laneta Laroche at [facility].  Please note the following dates:    Thursday, October 03, 2023 - Call the Surgical Center between 2:30 p.m. and 4:30 p.m. at [phone] for the time to report to [facility].      Preoperative Instructions:   Do not eat after midnight the night before surgery!  This includes candy, mints, gum, lozenges or chewing tobacco for patients over 18.  You can drink as much clear liquids as you'd like until 3 hours before your arrival time.    Allowed Clear Liquids: Water , Ginger Ale, Apple Juice or Gatorade (or Pedialyte for children) ONLY.  Do not consume any other liquids, no apple cider, no orange juice, no coffee.  Do not to wear eye make-up. Do not to wear dark nail polish. If you wear contact lenses, we ask that you please wear your glasses to your procedure and remove any false eyelashes if you have them.  Do not use any eye ointments the night before and the morning of surgery. You may use eye drops the night before and the morning of surgery.  Please bring your photo ID and insurance card. Leave all jewelry, credit cards, cash and other valuables at home.    Friday, October 04, 2023 - Surgery.  Please park in the [facility] at [facility] ([address]  [place], [place] [address]). Take the green elevators to the 2nd floor.  Turn left and look for the sign overhead that reads "Second Floor Surgical Center".  Check in with the secretary at the desk.  You will need a personal driver to take you home, ride shares like [employer] and [employer] are not adequate forms of transportation after surgery.    Friday, August 1, at 3:15 pm - Please report to Dr. Belenda office at the [facility] [place], [place] [address] - Purple elevator to the  3rd floor) for your one-week post op appointment.     If you have any questions, please feel free to call me at [phone] or email me at [email or web address]     Thank You,  Isaura Schiller [facility]  Ophthalmology Surgical Scheduler

## 2023-09-30 NOTE — Progress Notes (Addendum)
 09/30/23 0916   UM Patient Class Review   Patient Class Review Inpatient     Patient class effective as of 09/30/2023     9:17 AM- Writer attempted to call patient via phone on file [phone]  to notify of OBS. Patient did not answer.     10:18 AM- Called into patient's room @ [phone] to notify of obs. Patient did not answer phone.     11:18 AM-Called into patient's room @ [phone] to notify of obs. Patient did not answer phone. Form 2220 will be mailed to address on file:      [address]   [place] [address]       Glade Ingle RN  Utilization Management

## 2023-09-30 NOTE — Progress Notes (Signed)
 Physical Therapy Initial Evaluation    Therapy Recommendations:  Discharge Recommendations:  Discharge to Planned Living Arrangement:    Patient's mobility is currently a barrier to discharge. Further PT visits are needed.  Recommendations:   PT Discharge Equipment Recommended: To be determined,    Additional justification:   n/a  PT Positioning Recommendations: upright for meals  PT Mobility Recommendations: 1A RW SBA short distances vs. WC transport for long distance  PT Referral Recommendations: OT, SW, Home care    History of Present Admission: Per chart review: Shane Pearson is a 63 y.o. male with PMH notable for ESRD (on HD, last received today), HTN, lumbosacral spondylosis who presents progressive fatigue, generalized malaise, radiating body aches, bilateral chest pain with accompanying dyspnea on exertion, HA, dizziness and intermittent blurred vision that has been ongoing for the last 2 months.    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 d/t low BP and small veins    HERNIA REPAIR      Right inguinal hernia repair         Personal factors affecting treatment/recovery:  None identified    Comorbidities affecting treatment/recovery:  Cardiac history  End stage renal disease on dialysis (ESRD)    Clinical presentation:  stable    Patient complexity:    low level as indicated by above stability of condition, personal factors, environmental factors and comorbidities in addition to their impairments found on physical exam.       09/30/23 0802   Prior Living    Prior Living Situation Reported by patient   Lives With Family;Spouse  (spouse and mother)   Type of Home [place]   # Steps to Enter Home 2   # Rails to Enter Home 1   # Of Steps In Home 12  (to furnished basement)   # Rails in Home 1   Location of Bedrooms 1st floor   Location of Bathrooms 1st floor   Bathroom Accessibility Accessible;Walk-in  shower   Current Home Equipment Adjustable bed;Grab bars in shower/tub;Shower chair;Crutches   Additional Comments Resides with his spouse and mother in a single level ranch, however reports frequenting his basement often as that is where he watches TV. Pt states he has had an overall functional decline over the last 2 weeks.   Prior Function Level   Prior Function Level Reported by patient   Transfers Independent   Transfer Devices None   Walking Independent   Walking assistive devices used None   Stair negotiation Independent   Additional Comments Pt previously independent with mobility without AD, though reports over last 1.5 weeks has been using a single crutch to get around as he has been more dyspneic and unable to get around as easily due to worsening mobility. 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 Pt still drives; on dialysis T, TH, Sat. Wife assists with cooking, cleaning, household chores etc. Reports need for intermittent assist with bed mobility.   PT Tracking   PT TRACKING PT Assigned   Visit Number   Visit Number [facility]) / Treatment Day [facility]) 1   Visit Details [facility])   Visit Type [facility]) Evaluation   Cotreat   Cotreat with Occupational Therapy   Reason for Cotreat Patient Tolerance;Complimentary Goals   Precautions/Observations   Precautions used Yes   Fall Precautions General falls precautions  Activity Orders Present Yes   LDA Observation Monitors;Dialysis catheter   Vital Signs Response with Therapy stable   Was patient wearing a mask? No   PPE worn by Clinical research associate [facility]   Patient Subjective I'm just weak.   Additional Observations Approached pt supine in bed; pleasant and agreeable to session.   Other PT session shortened this date as transportation arrived to bring pt to opthalmology clinic.   Current Pain Assessment   Pain Assessment / Reassessment Assessment   Pain Scale 0-10 (Numeric Scale for Pain Intensity)   0-10  Pain Scale 9   Pain Location/Orientation  Generalized;Shoulder Right;Shoulder Left;Leg Right;Flank Right   Pain Descriptors Aching   Pain Comments 9/10 B shoulder; 7/10 RLE; 6/10 R flank   Vision    Current Vision Visual deficits   Additional Comments Pt endorses persistent blurred vision despite having gotten a new pair of prescription glasses. In addition to blurred vision he reports intermittent diplopia which varies between horizontal and vertical-appears to vary when asked to look at items in room such as computer screen (vertical); writers name badge (horizontal) and clock on wall (not present). When asked to cover L eye pt denies diplopia which was the same report as when he close the R eye. Visual tracking intact through all fields without evidence of nystagmus.   Communication   Insurance risk surveyor Style Verbal   Cognition   Cognition Tested   Arousal/Alertness Appropriate responses to stimuli   Orientation A&Ox4   Ability to Follow Instructions Follows all commands and directions without difficulty   Type of Instructions Given Verbal;Tactile   Additional Comments Pleasant and cooperative.   LE Assessment   Assessment Focus Strength   LLE Strength   Additional Comments 4-/5   RLE Strength   Additional Comments 4-/5   Sensation   Sensation Deficit noted   Additional Comments Endorses N/T through L shoulder and hand   Bed Mobility   Bed mobility Tested   Supine to Sit Minimum assist;Head of bed elevated   Sit to Supine Not tested   Additional comments Supine>sit: able to transition LEs towards EOB, requires minA at trunk to come into sitting. Pt left in [facility] with transportation at end of session. DNT sit>supine.   Transfers   Transfers Tested   Sit to Stand Stand by assistance   Stand to sit Stand by assistance   Transfer Assistive Device rolling walker   Additional comments sit<>stand from EOB with SBA and RW; no overt LOB, denies dizziness-safely transitions into gait.   Mobility   Mobility: Gait/Stairs Tested   Gait  Pattern Decreased cadence;Decreased R step length;Decreased R step height;Decreased L step length;Decreased L step height;Trunk flexed   Ambulation Assist Stand by   Ambulation Distance (Feet) ~15'   Ambulation Assistive Device rolling walker   Stairs Assistance Not tested   Additional comments Pt ambulates short distance of ~15' with RW and SBA-no overt LOB, trunk flexed with shortened step/stride length throughout.   Training and Education   Patient Educated on role of PT, POC and purpose of session.   Balance   Balance Tested   Sitting - Static Independent   Sitting - Dynamic Independent   Standing - Static Standby assist;Supported   Standing - Dynamic Standby assist;Supported   Additional comments BUE support RW   PT AM-PAC Mobility   Turning over in bed? 3   Moving from lying on back to sitting on the side of the bed? 3  Moving to and from a bed to a chair? 3   Sitting down on and standing up from a chair with arms? 3   Need to walk in hospital room? 3   Climbing 3 - 5 steps with a railing? 1   Total Raw Score 16   AM-PAC T-Scale Score 38.32   Assessment   Brief Assessment Appropriate for skilled therapy   Problem List Impaired endurance;Impaired balance;Impaired transfers;Impaired ambulation;Impaired bed mobility;Impaired stair navigation;Impaired functional mobility;Pain contributing to impairment   Patient / Family Goal to get better   Overall Assessment Pt presenting below functional baseline with limited strength, balance and endurance. Anticipate with ongoing medical management and additional PT sessions, pt will progress towards safe return home with caregiver support.   Plan/Recommendation   PT Treatment Interventions Assess functional mobility   PT Frequency 3-5 x/wk   PT Positioning Recommendations upright for meals   PT Mobility Recommendations 1A RW SBA short distances vs. WC transport for long distance   PT Referral Recommendations OT;SW;Home care   PT Discharge Recommendations Anticipate return  to prior living arrangement;Home PT;Intermittent supervision/assist   PT Discharge Equipment Recommended To be determined   Transportation Recommendations any   PT Assessment/Recommendations Reviewed With: Patient;Nursing   Next PT Visit assess gait tolerance and stairs   PT needs to see patient prior to DC  Yes   Time Calculation   Total Time Therapeutic Activities (minutes) 0   Total Time Gait Training (minutes) 0   Total Time Therapeutic Exercises (minutes) 0   Total Time Neuromuscular Re-education (minutes) 0   Total Time Group Therapy (minutes) 0   PT Timed Codes 0   PT Untimed Codes 19   PT Unbilled Time 0   PT Total Treatment 19   Plan and Onset date   Plan of Care Date 09/30/23   Onset Date 09/28/23   Treatment Start Date 09/30/23     Donovan Maffucci, PT, DPT  (Please contact physical therapist on the treatment team via secure chat or via the secure chat group for your unit Physical Therapist (PT) with any questions.  On weekends/holidays, please utilize the secure chat group, [facility] Physical Therapy 1st call, to contact PT.)          [1]   Past Medical History:  Diagnosis Date    Arthritis     CHF (congestive heart failure)     Colon polyp     Dialysis patient     ESRD (end stage renal disease)     GERD (gastroesophageal reflux disease)     Hypercholesterolemia     Hypotension     Ischemic colitis     Thyroid disease     hypo

## 2023-09-30 NOTE — Invasive Procedure Plan of Care (Signed)
 CONSENT FOR MEDICAL  OR SURGICAL PROCEDURE                            Patient Name: Shane Pearson  [facility] [address] MR                                                              DOB: 1960-09-22         Please read this form or have someone read it to you.   It's important to understand all parts of this form. If something isn't clear, ask us  to explain.   When you sign it, that means you understand the form and give us  permission to do this surgery or procedure.     I agree for Deette Zachary BIRCH, NP , and Members of the Procedure Team (Attendings, Advanced Practice providers, Residents and medical students) for duration of hospitalization along with any assistants* they may choose, to treat the following condition(s): Obtain cerebral spinal fluid for evaluation    By doing this surgery or procedure on me: Lumbar puncture    This is also known as: Lumbar puncture    Laterality: Not applicable     *if you'd like a list of the assistants, please ask. We can give that to you.    1. The care provider has explained my condition to me. They have told me how the procedure can help me. They have told me about other ways of treating my condition. I understand the care provider cannot guarantee the result of the procedure. If I don't have this procedure, my other choices are: No lumbar puncture     2. The care provider has told me the risks (problems that can happen) of the procedure. I understand there may be unwanted results. The risks that are related to this procedure include: Bleeding, infection, damage to surrounding structures, headache, pain, epidural hematoma, CSF leak, injury to nerve. numbness, tingling or paralysis to lower extremities.     3. I understand that during the procedure, my care provider may find a condition that we didn't know about before the treatment started. Therefore, I agree that my care provider can perform any other treatment which they think is necessary and available.    4. I give permission to the  hospital and/or its departments to examine and keep tissue, blood, body parts, fluids or materials removed from my body during the procedure(s) to aid in diagnosis and treatment, after which they may be used for scientific research or teaching by appropriate persons. If these materials are used for science or teaching, my identity will be protected. I will no longer own or have any rights to these materials regardless of how they may be used.    5. My care provider might want a representative from a medical device company to be there during my procedure. I understand that person works for:          The ways they might help my care provider during my procedure include:            6. Here are my decisions about receiving blood, blood products, or tissues. I understand my decisions cover the time before, during and after my procedure, my treatment, and my time  in the hospital. After my procedure, if my condition changes a lot, my care provider will talk with me again about receiving blood or blood products. At that time, my care provider might need me to review and sign another consent form, about getting or refusing blood.    I understand that the blood is from the community blood supply. Volunteers donated the blood, the volunteers were screened for health problems. The blood was examined with very sensitive and accurate tests to look for hepatitis, HIV/AIDS, and other diseases. Before I receive blood, it is tested again to make sure it is the correct type.    My chances of getting a sickness from blood products are small. But no transfusion is 100% safe. I understand that my care provider feels the good I will receive from the blood is greater than the chances of something going wrong. My care provider has answered my questions about blood products.      My decision  about blood or  blood products           My decision   about tissue  Implants              I understand this  form.    My care provider  or  his/her  assistants have  explained:   What I am having done and why I need it.  What other choices I can make instead of having this done.  The benefits and possible risks (problems) to me of having this done.  The benefits and possible risks (problems) to me of receiving transplants, blood, or blood products.  There is no guarantee of the results.  The care provider may not stay with me the entire time that I am in the operating or procedure room.  My provider has explained how this may affect my procedure. My provider has answered my  questions about this.         I give my  permission for  this surgery or  procedure.            _________________________________________                                     My signature  (or parent or other person authorized to sign for you, if you are unable to sign for yourself or if you are under 73 years old)             _____________     Date      (MM/DD/YYYY)           _______    Time      Electronic Signatures will display at the bottom of the consent form.    Care provider's statement: I have discussed the planned procedure, including the possibility for transfusion of blood  products or receipt of tissue as necessary; expected benefits; the possible complications and risks; and possible alternatives  and their benefits and risks with the patients or the patient's surrogate. In my opinion, the patient or the patient's surrogate  understands the proposed procedure, its risks, benefits and alternatives.              Electronically signed by: Zachary JONETTA Gilles, NP  09/30/2023         Date        4:09 PM        Time

## 2023-09-30 NOTE — Progress Notes (Signed)
 Evaluation        Discharge Recommendations:  Discharge to Planned Living Arrangement: Patient's ADL skills are currently a barrier to discharge. Further OT visits are needed. Recommend Home OT.      OT need to see patient prior to discharge: Yes  Equipment Recommendations: TBD      Additional justification: TBD    OT Hospital Stay Recommendations: Encourage participation with ADLs, Toilet transfer status - 1A with RW SBA short distances, and OOB for meals        OT Referral Recommendations: Physical Therapy and Social Work    HPI:  Admitting Dx: Active Hospital Problems    Diarrhea      Failure to thrive in adult        PMH: Past Medical History[1]    PSH: Past Surgical History[2]    SUBJECTIVE and OBJECTIVE:  Precautions:   Fall Precautions: General fall precautions  L/D/A: Monitors  Activity order: Activity as tolerated  Patient wearing mask: No  Writer wearing PPE including: Mask and Gloves  Vitals: Vital signs stable  Subjective: I know I'm weaker than normal    Co-treat with Physical Therapy for complementary goals    Home Living Prior to Admission:   Type of home: [place] home  # of steps to enter home:  2  # of steps in home: 12 - to basement  Bedroom: First floor  Bathroom: First floor  Bathroom (shower/tub): Walk-in shower  Home equipment: Shower chair , Grab bar in shower, and adjustable bed, crutches  Bathroom accessibility: Accessible      Prior Function:  Resides with: Spouse and and mother  Level of independence:  ADLs: Independent  IADLs: shares with spouse  Function mobility: Independent  Receives help from: Family  Vocational: works FT as Airline pilot  Pt reports PTA being fully indep in ADL and fxnal mobility, reports sharing IADLs with spouse such as groceries, cleaning, cooking. Has dialysis T/Thurs/Sat. Reports he still drives and works 2 jobs. Denies recent falls.    Pain:   9  Location: B shlders, RLE, R flank  Description: aching  Intervention: refer to RN for pain mgmt and  repositioned    ASSESSMENT  Cognition: No deficit noted  Alertness - Alert  Orientation - Person, Place, Time, Situation  Attention - Intact  Short term memory - Intact  Awareness- Intact  Pt polite, follows all commands, verbalizes all needs and demos awareness to recent events.     Vision: Pt endorsing ongoing blurred vision, did not improve with new prescription glasses. Also endorsing diplopia, which is mostly vertical diplopia. Reports resolved with occlusion of one eye at a time. EOM intact.    Perception: No deficit noted    UE Function:   Hand Dominance: Right  ROM:   RUE: WFL     LUE: WFL      Strength:   RUE: 4/5     LUE: 4/5      Sensation: Reports numbness/tingling electric feeling through LUE intermittently        Coordination: Intact    Functional mobility:  Balance: Tested  Static sitting: Good, unsupported  Dynamic sitting: Good, unsupported  Static standing: SBA, supported  Dynamic standing: SBA, supported    Bed Mobility: Tested  Supine to sit: minimal assist, HOB elevated  Sit to supine: NT-, Pt taken by WC transport at end of session         Functional Transfers: Tested  Sit to stand: SBA, Rolling walker  Stand to  sit: SBA, Rolling walker  Functional mobility: SBA, Rolling walker  Pt ambulates short distance ~15 ft from eob <> WC using RW and no overt LOB, SBA provided as pt fatigues quickly and visually becomes SOB.       ADL Assessment:  ADLs completed/simulated with level of assist as dictated below:   Feeding: independent   Grooming: SBA   Bathing: minimal assist, est   UB Dressing: contact guard assist   LB Dressing: minimal assist   Toileting: minimal assist           ASSESSMENT and PLAN    Assessment: Patient currently presenting with impaired ADL status, impaired IADLs, impaired self-care transfers, impaired UE strength, impaired balance, impaired endurance, impaired sensation, visual deficit, and pain, limiting performance and will benefit from OT service to address ADL retraining, IADL  retraining, functional transfer training, visual re-education, UE strengthening/ROM, endurance training, patient/family training, equipment eval/education, gross motor activities, compensatory technique education, and community re-entry.    Frequency: 3-5x/week    Patient Education: role of OT, plan of care, and discharge recommendations   Learners: patient   Readiness: acceptance   Method: explanation   Response: verbalizes understanding    Multidisciplinary Communication:  patient, RN, PT        OCCUPATIONAL THERAPY PROVIDER   Electronically Signed By:   Vernell Ion, OT    Please contact the OT via Secure Chat to: [facility] with any questions/concerns and/or update requests.    Timed Calculations:  Timed Codes:  0  Untimed Codes: 18  Unbilled Time: 0  Total Time:  18 min    OT EVALUATION COMPLEXITY   1.  Occupational Profile & History (Medical/Therapy)   Moderate (Expanded Review)    2.  Public relations account executive (Includes occupations from: ADL, IADL, Rest/Sleep, Education, Work, Copywriter, advertising, Leisure, Social Participation)   Moderate (3-5 occupations/performance deficits)    3.  Clinical Decision Making   i  Assessment    Moderate (Detailed)   ii  Co-Morbidities    Moderate (1+)   iii  Modifications    Moderate (Minimum - Moderate)   iv Treatment Options (Approaches include: Create, Promote, Establish, Restore, Maintain, Modify, Prevent)    Moderate (Several)     Moderate    4.  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 d/t low  BP and small veins    HERNIA REPAIR      Right inguinal hernia repair

## 2023-10-01 ENCOUNTER — Inpatient Hospital Stay

## 2023-10-01 DIAGNOSIS — C801 Malignant (primary) neoplasm, unspecified: Secondary | ICD-10-CM

## 2023-10-01 DIAGNOSIS — R590 Localized enlarged lymph nodes: Secondary | ICD-10-CM

## 2023-10-01 DIAGNOSIS — G893 Neoplasm related pain (acute) (chronic): Secondary | ICD-10-CM

## 2023-10-01 DIAGNOSIS — N186 End stage renal disease: Secondary | ICD-10-CM

## 2023-10-01 DIAGNOSIS — M899 Disorder of bone, unspecified: Secondary | ICD-10-CM

## 2023-10-01 DIAGNOSIS — J910 Malignant pleural effusion: Secondary | ICD-10-CM

## 2023-10-01 DIAGNOSIS — C7951 Secondary malignant neoplasm of bone: Secondary | ICD-10-CM

## 2023-10-01 DIAGNOSIS — M4803 Spinal stenosis, cervicothoracic region: Secondary | ICD-10-CM

## 2023-10-01 DIAGNOSIS — M6289 Other specified disorders of muscle: Secondary | ICD-10-CM

## 2023-10-01 DIAGNOSIS — Z992 Dependence on renal dialysis: Secondary | ICD-10-CM

## 2023-10-01 DIAGNOSIS — M4802 Spinal stenosis, cervical region: Secondary | ICD-10-CM

## 2023-10-01 DIAGNOSIS — C7989 Secondary malignant neoplasm of other specified sites: Secondary | ICD-10-CM

## 2023-10-01 DIAGNOSIS — C799 Secondary malignant neoplasm of unspecified site: Secondary | ICD-10-CM

## 2023-10-01 DIAGNOSIS — R918 Other nonspecific abnormal finding of lung field: Secondary | ICD-10-CM

## 2023-10-01 LAB — HEPATIC FUNCTION PANEL
ALT: 8 U/L (ref 0–50)
AST: 55 U/L — ABNORMAL HIGH (ref 0–50)
Albumin: 3.4 g/dL — ABNORMAL LOW (ref 3.5–5.2)
Alk Phos: 228 U/L — ABNORMAL HIGH (ref 40–130)
Bilirubin,Direct: <0.2 mg/dL (ref 0.0–0.3)
Bilirubin,Total: 0.3 mg/dL (ref 0.0–1.2)
Total Protein: 6.4 g/dL (ref 6.3–7.7)

## 2023-10-01 LAB — CBC AND DIFFERENTIAL
Baso # K/uL: 0.1 THOU/uL (ref 0.0–0.2)
Eos # K/uL: 0.3 THOU/uL (ref 0.0–0.5)
Hematocrit: 37 % (ref 37–52)
Hemoglobin: 10.9 g/dL — ABNORMAL LOW (ref 12.0–17.0)
IMM Granulocytes #: 0.1 THOU/uL — ABNORMAL HIGH
IMM Granulocytes: 0.9 %
Lymph # K/uL: 1.4 THOU/uL (ref 1.0–5.0)
MCV: 83 fL (ref 75–100)
Mono # K/uL: 1.7 THOU/uL — ABNORMAL HIGH (ref 0.1–1.0)
Neut # K/uL: 12 THOU/uL — ABNORMAL HIGH (ref 1.5–6.5)
Nucl RBC # K/uL: 0 THOU/uL (ref 0.0–0.1)
Nucl RBC %: 0 /100{WBCs} (ref 0.0–0.2)
Platelets: 527 THOU/uL — ABNORMAL HIGH (ref 150–450)
RBC: 4.5 MIL/uL (ref 4.0–6.0)
RDW: 18.9 % — ABNORMAL HIGH (ref 0.0–15.0)
Seg Neut %: 76.7 %
WBC: 15.6 THOU/uL — ABNORMAL HIGH (ref 3.5–11.0)

## 2023-10-01 LAB — PROTEIN, BODY FLUID: Protein,FL: 4.2 g/dL

## 2023-10-01 LAB — HEPATITIS B PROF
HBV Core Ab: NEGATIVE
HBV S Ab Quant: 28.66 m[IU]/mL
HBV S Ab: POSITIVE
HBV S Ag: NEGATIVE

## 2023-10-01 LAB — BASIC METABOLIC PANEL
Anion Gap: 21 — ABNORMAL HIGH (ref 7–16)
CO2: 26 mmol/L (ref 20–28)
Calcium: 9 mg/dL (ref 8.6–10.2)
Chloride: 90 mmol/L — ABNORMAL LOW (ref 96–108)
Creatinine: 12.1 mg/dL — ABNORMAL HIGH (ref 0.67–1.17)
Glucose: 82 mg/dL (ref 60–99)
Lab: 60 mg/dL — ABNORMAL HIGH (ref 6–20)
Potassium: 5.5 mmol/L — ABNORMAL HIGH (ref 3.3–5.1)
Sodium: 137 mmol/L (ref 133–145)
eGFR BY CREAT: 4 — AB

## 2023-10-01 LAB — LACTATE DEHYDROGENASE, BODY FLUID: LD,FL: 547 U/L

## 2023-10-01 LAB — PHOSPHORUS: Phosphorus: 4.6 mg/dL — ABNORMAL HIGH (ref 2.7–4.5)

## 2023-10-01 LAB — AEROBIC BACTERIAL PLEURAL OR THORACENTESIS FLUID CULTURE AND STAIN

## 2023-10-01 LAB — GRAM STAIN: Gram Stain: 0

## 2023-10-01 LAB — URIC ACID: Urate: 7.4 mg/dL (ref 3.9–9.0)

## 2023-10-01 LAB — LACTATE DEHYDROGENASE: LD: 798 U/L — ABNORMAL HIGH (ref 118–225)

## 2023-10-01 LAB — MAGNESIUM: Magnesium: 1.7 mg/dL (ref 1.6–2.5)

## 2023-10-01 MED ORDER — MAGNESIUM-OXIDE 400 (241.3 MG) MG PO TABS *WRAPPED*
400.0000 mg | ORAL_TABLET | Freq: Every day | ORAL | Status: DC
Start: 2023-10-01 — End: 2023-10-10
  Administered 2023-10-01 – 2023-10-10 (×9): 400 mg via ORAL
  Filled 2023-10-01 (×9): qty 1

## 2023-10-01 MED ORDER — ACETAMINOPHEN 500 MG PO TABS *I*
1000.0000 mg | ORAL_TABLET | Freq: Three times a day (TID) | ORAL | Status: DC
Start: 2023-10-01 — End: 2023-10-06
  Administered 2023-10-01 – 2023-10-06 (×13): 1000 mg via ORAL
  Filled 2023-10-01 (×13): qty 2

## 2023-10-01 MED ORDER — OXYCODONE HCL 5 MG PO TABS *I*
5.0000 mg | ORAL_TABLET | Freq: Four times a day (QID) | ORAL | Status: DC | PRN
Start: 2023-10-01 — End: 2023-10-05
  Filled 2023-10-01: qty 1

## 2023-10-01 MED ORDER — GADOPICLENOL 0.5 MMOL/ML (VUEWAY) IV SOLN *I*
0.1000 mL/kg | Freq: Once | INTRAVENOUS | Status: AC
Start: 2023-10-01 — End: 2023-10-01
  Administered 2023-10-01: 8.01 mL via INTRAVENOUS

## 2023-10-01 MED ORDER — STERILE WATER FOR IRRIGATION IR SOLN *I*
900.0000 mL | Freq: Once | Status: AC
Start: 2023-10-01 — End: 2023-10-01
  Administered 2023-10-01: 900 mL via ORAL

## 2023-10-01 MED ORDER — IOHEXOL 350 MG/ML (OMNIPAQUE) IV SOLN 500ML BOTTLE *I*
1.0000 mL | Freq: Once | INTRAVENOUS | Status: AC
Start: 2023-10-01 — End: 2023-10-01
  Administered 2023-10-01: 118 mL via INTRAVENOUS

## 2023-10-01 NOTE — Progress Notes (Signed)
 Diagnostic Imaging Nurse Handoff    Study: MRI  Neuro Status:Alert and Oriented x3, can follow commands and is consentable  Telemetry:No - Patient is not currently on telemetry or cardiac monitoring    IV Access:  Hemodialysis Catheter:  Tunneled Double lumen Left Internal Jugular (Active)       Peripheral IV 09/30/23 1945 Ultrasound guided 22 G 2.0 inches Right Forearm (middle third) (Active)   Phlebitis Scale Grade 0 09/30/23 2329   Infiltration Scale Grade 0 09/30/23 2329   Line Status Flushed 09/30/23 2329   Dressing Type Transparent 09/30/23 2329   Dressing Status Clean;Dry;Intact 09/30/23 2329     Continuous Infusions:No  Arterial line/transducer present:No.  Additional lines and drains: None  Medication Patches/Insulin  Pumps:No  Precautions:None (Universal)  Patient Readiness for MRI:: Patient dressed in MRI safe scrubs or gown   Transport:Wheelchair  Hover Requested:No, Patient can ambulate Independently   Respiratory:Room Air  Sleep Apnea:No  Claustrophobic: No  Pain Concerns:No  Sedation/Pain/Medication Plan :NA  Able to Lay Flat for the Duration of the Scan:Yes  Code status: Full Code    Devere LITTIE Sprague, RN Received Handoff Report from [facility] for MRI 2:14 AM

## 2023-10-01 NOTE — Continuity of Care (Signed)
 Chart reviewed for discharge planning needs.  [facility] will continue to follow patient's hospital course until discharge plans are finalized.     Vilinda Boehringer RN, Acute Care Coordinator, [phone]  Weekend covering pager [phone]

## 2023-10-01 NOTE — Invasive Procedure Plan of Care (Signed)
 CONSENT FOR MEDICAL  OR SURGICAL PROCEDURE                            Patient Name: Shane Pearson  [facility] [address] MR                                                              DOB: 04-Jul-1960         Please read this form or have someone read it to you.   It's important to understand all parts of this form. If something isn't clear, ask us  to explain.   When you sign it, that means you understand the form and give us  permission to do this surgery or procedure.     I agree for Juliaette Kelly SAILOR, NP , and Members of the procedure team (Attendings, advanced practice providers, residents, and medical students).  along with any assistants* they may choose, to treat the following condition(s): Pleural effusion, I.e. fluid around the lung   By doing this surgery or procedure on me: Thoracentesis, removal of fluid from chest   This is also known as: Thoracentesis   Laterality:     *if you'd like a list of the assistants, please ask. We can give that to you.    1. The care provider has explained my condition to me. They have told me how the procedure can help me. They have told me about other ways of treating my condition. I understand the care provider cannot guarantee the result of the procedure. If I don't have this procedure, my other choices are: No procedure    2. The care provider has told me the risks (problems that can happen) of the procedure. I understand there may be unwanted results. The risks that are related to this procedure include: Bleeding, pain, infection, damage to surrounding structures, pneumothorax (collapsed lung), unsuccessful procedure, pulmonary edema (fluid on the lung).    3. I understand that during the procedure, my care provider may find a condition that we didn't know about before the treatment started. Therefore, I agree that my care provider can perform any other treatment which they think is necessary and available.    4. I give permission to the hospital and/or its departments to examine and  keep tissue, blood, body parts, fluids or materials removed from my body during the procedure(s) to aid in diagnosis and treatment, after which they may be used for scientific research or teaching by appropriate persons. If these materials are used for science or teaching, my identity will be protected. I will no longer own or have any rights to these materials regardless of how they may be used.    5. My care provider might want a representative from a medical device company to be there during my procedure. I understand that person works for:          The ways they might help my care provider during my procedure include:            6. Here are my decisions about receiving blood, blood products, or tissues. I understand my decisions cover the time before, during and after my procedure, my treatment, and my time in the hospital. After my procedure, if my condition changes a  lot, my care provider will talk with me again about receiving blood or blood products. At that time, my care provider might need me to review and sign another consent form, about getting or refusing blood.    I understand that the blood is from the community blood supply. Volunteers donated the blood, the volunteers were screened for health problems. The blood was examined with very sensitive and accurate tests to look for hepatitis, HIV/AIDS, and other diseases. Before I receive blood, it is tested again to make sure it is the correct type.    My chances of getting a sickness from blood products are small. But no transfusion is 100% safe. I understand that my care provider feels the good I will receive from the blood is greater than the chances of something going wrong. My care provider has answered my questions about blood products.      My decision  about blood or  blood products   Yes, I agree to receive blood or blood products if my care provider thinks they're needed.        My decision   about tissue  Implants              I understand  this  form.    My care provider  or his/her  assistants have  explained:   What I am having done and why I need it.  What other choices I can make instead of having this done.  The benefits and possible risks (problems) to me of having this done.  The benefits and possible risks (problems) to me of receiving transplants, blood, or blood products.  There is no guarantee of the results.  The care provider may not stay with me the entire time that I am in the operating or procedure room.  My provider has explained how this may affect my procedure. My provider has answered my  questions about this.         I give my  permission for  this surgery or  procedure.            _________________________________________                                     My signature  (or parent or other person authorized to sign for you, if you are unable to sign for yourself or if you are under 60 years old)             _____________     Date      (MM/DD/YYYY)           _______    Time      Electronic Signatures will display at the bottom of the consent form.    Care provider's statement: I have discussed the planned procedure, including the possibility for transfusion of blood  products or receipt of tissue as necessary; expected benefits; the possible complications and risks; and possible alternatives  and their benefits and risks with the patients or the patient's surrogate. In my opinion, the patient or the patient's surrogate  understands the proposed procedure, its risks, benefits and alternatives.              Electronically signed by: Kelly LOISE Gate, NP  10/01/2023         Date        8:55 AM        Time   Your doctor or someone your doctor has appointed has told you that you may need blood or a blood product transfusion, which has been collected from volunteers, as part of your treatment as a patient.    The reasons you might need blood or blood products include, but are not limited to:    Significant loss of your own blood  Your body may not be getting enough oxygen to its tissues   Treatment of bleeding disorders caused by low platelet counts or platelets that do not work right (platelets are part of a cell that helps to form clots and keeps you from bleeding too much).  You may not have enough of other substances that help your blood clot or stop you from bleeding more  The risks of getting a transfusion of blood or blood products include, but are not limited to:   Damage to the lungs  Difficulty breathing due to fluid in the lungs  The product may contain bacteria or rarely a virus (which includes HIV and Hepatitis)  Blood from the community blood supply has been collected from volunteer donors who have been screened for health risk. The blood has been tested for major blood transmitted disease, but no transfusion is 100% safe. The blood is tested with very sensitive and accurate tests to screen for hepatitis, AIDS, and other disease, which makes the risks very small.  You may have side effects from the transfusion (rash, fever, chills) or an allergic reaction  The transfusion increases your risks of getting infection or cancer coming back  The transfusion can increase the time you have to stay in the hospital  The transfusion can potentially cause death if the wrong blood is given or your body rejects the blood  Before blood is transfused, it is tested again to make sure it is the correct type  There are other options than getting blood or blood products from other people and they include:   Drugs which can decrease bleeding  Drugs which can cause your body to make more blood (used in elective procedures with advance notice)  Autologous (your own blood) donation (needs pre-arrangement)  No transfusion  If you exercise your right to refuse to be transfused with blood or blood products; these things listed below, among others, could happen to you:  Your body may not get enough oxygen and suffer  damage  You may have a higher chance of bleeding  You may limit other options for your condition  You may die from losing too much blood

## 2023-10-01 NOTE — Provider Consult (Addendum)
 Oncology Consult Note    Requesting Provider: Willena Gowda, MD  Reason for Consult: concern for metastatic disease      History of Present Illness  Shane Pearson is a 63 y.o. male with a past medical history significant for CHF, ESRD on HD TTS, and osteoarthritis of left shoulder on whom we are consulted for concern for metastatic disease.    The patient presented with with chest pain and left shoulder pain x 2 weeks. He also had other concerns including inability to ambulate x 1 week due to right leg pain, migrating myalgias, unintentional weight loss, fatigue, and night sweats x 3-4 weeks.    CT cervical spine showed an asymmetric patchy lucency in C7 extending into the left transverse process. MR was obtained to better characterize these lesions and showed an expansile enhancing lesion within the C7 vertebral body with soft tissue components extending into the prevertebral space and the left anterolateral epidural space. Additionally, there is suspected soft tissue component extending into the left C6-C7 and C7-T1 neuroforamen which causes severe left foraminal narrowing at C7-T1 and moderate to severe left foraminal narrowing at C6-C7. Associated trace fluid within the prevertebral space. Overall, findings are suspicious for osseous metastatic disease. A CT chest was also obtained and showed moderate R pleural effusion and enlarged mediastinal lymph nodes.       Oncology History    No history exists.     Social History:  Former history of tobacco use - 4.5 pack years, quit in 1994.     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    Lives in [place] with his wife and his mother    Family Hx:  Brother had lung cancer  Another brother passed away of HIV  Sister has hx of aneurysms  Another brother had cancer, unsure what primary    Parents have no history of cancer    Home Medications  Current Outpatient Medications   Medication Instructions    acetaminophen  (TYLENOL ) 650 mg, EVERY 4-6 HOURS PRN     aspirin  81 mg, DAILY    atorvastatin  (LIPITOR) 20 mg, DAILY WITH DINNER    b complex-vitamin c-folic acid  (NEPHRO-VITE) tablet 1 tablet, DAILY    calcium -vitamin D  (OSCAL-500) 500-200 MG-UNIT per tablet 1 tablet, 2 TIMES DAILY    cinacalcet  (SENSIPAR ) 30 mg, DAILY    fludrocortisone  (FLORINEF ) 0.1 mg, 3 TIMES DAILY    fluticasone  (FLONASE ) 50 MCG/ACT nasal spray 1 spray, DAILY    magnesium  oxide 400 mg, Oral, DAILY    midodrine  (PROAMATINE ) 15 mg, Oral, 3 TIMES DAILY    omeprazole  (PRILOSEC) 40 mg, DAILY BEFORE BREAKFAST      Current Facility-Administered Medications:     heparin  (porcine) 1,000 units/mL injection 0-5,000 units, 0-5 mL, Intracatheter, PRN, Rutowski, [place], MD, 1,600 units at 10/01/23 1240    heparin  (porcine) 1,000 units/mL injection 0-5,000 units, 0-5 mL, Intracatheter, PRN, Kathi Agent, MD, 1,600 units at 10/01/23 1240    lidocaine  HCL 1 % injection 10 mL, 10 mL, Subcutaneous, Once, Heeks, George D, NP    sodium chloride  0.9 % FLUSH REQUIRED IF PATIENT HAS IV, 0-500 mL/hr, Intravenous, PRN, Jeannetta Cough, MD, PhD    dextrose  5 % FLUSH REQUIRED IF PATIENT HAS IV, 0-500 mL/hr, Intravenous, PRN, Jeannetta Cough, MD, PhD    acetaminophen  (TYLENOL ) tablet 1,000 mg, 1,000 mg, Oral, Q8H PRN, Jeannetta Cough, MD, PhD, 1,000 mg at 09/29/23 2121    aspirin  EC tablet 81 mg, 81 mg, Oral,  Daily, Jeannetta Cough, MD, PhD, 81 mg at 10/01/23 1440    atorvastatin  (LIPITOR) tablet 20 mg, 20 mg, Oral, Daily with dinner, Jeannetta Cough, MD, PhD, 20 mg at 09/30/23 1824    B complex-vitamin C-folic acid  (NEPHRO-VITE) tablet 1 tablet, 1 tablet, Oral, Daily, Jeannetta Cough, MD, PhD, 1 tablet at 10/01/23 1440    calcium -vitamin D  (OSCAL-500) 500mg -200unit per tablet 1 tablet, 1 tablet, Oral, BID, Jeannetta Cough, MD, PhD, 1 tablet at 10/01/23 1439    cinacalcet  (SENSIPAR ) tablet 30 mg, 30 mg, Oral, Daily, Jeannetta Cough, MD, PhD, 30 mg at 10/01/23 1439    fludrocortisone  (FLORINEF ) tablet 0.1 mg, 0.1 mg, Oral, TID, Jeannetta Cough, MD, PhD, 0.1 mg at 10/01/23 0126    fluticasone  (FLONASE ) 50 MCG/ACT nasal spray 1 spray, 1 spray, Nasal, Daily, Rice, Matthew, MD, PhD, 1 spray at 10/01/23 1442    midodrine  (PROAMATINE ) tablet 15 mg, 15 mg, Oral, TID, Jeannetta Cough, MD, PhD, 15 mg at 10/01/23 0813    omeprazole  (PriLOSEC) capsule 40 mg, 40 mg, Oral, QAM, Rice, Matthew, MD, PhD, 40 mg at 10/01/23 1439    Lidocaine  4 % patch 2 patch, 2 patch, Transdermal, Q24H, Jeannetta Cough, MD, PhD, 2 patch at 09/29/23 9676    heparin  (porcine) SQ injection 5,000 units, 5,000 units, Subcutaneous, TID, Jeannetta Cough, MD, PhD, 5,000 units at 09/30/23 2314    Review of Systems  14 point ROS completed. Pertinent positives/negatives as per HPI above. Remainder negative.       Vitals  Vitals:    10/01/23 1320   BP:    Pulse:    Resp:    Temp:    Weight:    Height: 165.1 cm (5' 5)       Physical Exam  GENERAL: Comfortable, NAD  NEURO: AxOx3  HEENT: NCAT,. No scleral icterus. No conjunctival pallor or injection. Mucus membranes moist;   LUNGS: Normal WOB on room air, diminished breath sounds in lung bases R>L  HEART: RRR, HS I+II+ no added sounds, no pedal edema   EXTREMITIES: No lower extremity edema   SKIN: No rashes or erythema    Lab Results:  Lab Results   Component Value Date/Time    NA 137 10/01/2023 01:22 AM    K 5.5 (H) 10/01/2023 01:22 AM    CL 90 (L) 10/01/2023 01:22 AM    CO2 26 10/01/2023 01:22 AM    UN 60 (H) 10/01/2023 01:22 AM    CREAT 12.10 (H) 10/01/2023 01:22 AM    CREAT 13.98 (H) 01/17/2019 10:38 AM    GFRC 3 (!) 11/05/2017 01:12 PM    GFRB 3 (!) 11/05/2017 01:12 PM    GLU 82 10/01/2023 01:22 AM    CA 9.0 10/01/2023 01:22 AM    TP 6.4 10/01/2023 01:22 AM    ALB 3.4 (L) 10/01/2023 01:22 AM    ALT 8 10/01/2023 01:22 AM    AST 55 (H) 10/01/2023 01:22 AM    ALK 228 (H) 10/01/2023 01:22 AM    TB 0.3 10/01/2023 01:22 AM    DB <0.2 10/01/2023 01:22 AM     Lab Results   Component Value Date/Time    WBC 15.6 (H) 10/01/2023 01:22 AM    RBC 4.5  10/01/2023 01:22 AM    HGB 10.9 (L) 10/01/2023 01:22 AM    HCT 37 10/01/2023 01:22 AM    MCV 83 10/01/2023 01:22 AM    RDW 18.9 (H) 10/01/2023 01:22 AM    PLT 527 (H) 10/01/2023  01:22 AM    SEGR 76.7 10/01/2023 01:22 AM    LYMPR 26.3 07/02/2020 07:48 AM    MONOR 8.4 07/02/2020 07:48 AM    EOSR 5.1 07/02/2020 07:48 AM    BASOR 0.5 07/02/2020 07:48 AM    ASEGR 12.0 (H) 10/01/2023 01:22 AM    ALYMR 1.4 10/01/2023 01:22 AM    AMONR 1.7 (H) 10/01/2023 01:22 AM    AEOSR 0.3 10/01/2023 01:22 AM    ABASR 0.1 10/01/2023 01:22 AM    INR 1.4 (H) 09/30/2023 07:43 PM    PTT 36.3 03/20/2018 08:08 PM       Pathology  None    Assessment and Plan:   Shane Pearson is a 63 y.o. male with CHF, ESRD on HD TTS, and osteoarthritis of left shoulder on whom we are consulted for concern for new metastatic disease.     MR was obtained and shows a new expansile enhancing lesion in C7 vertebral body and other findings that are suspicious for osseous metastatic disease. CT chest with new R pleueral effusion, enlarged mediastinal lymph nodes, and small (4 mm) pulmonary nodules. Of note, PSA was 0.22, making a prostate primary unlikely. Lung cancer is certainly at the top of our differential, but melanoma, gastric, renal cell, thyroid, and breast cancer are also on the differential as presenting with malignant pleural effusions.    The patient sees dermatology annually and has had no history of skin cancer.     Metastatic disease, unknown primary  Presumed malignant pleural effusion  - Follow up MR thoracic and lumbar spine  - Follow up MR head  - Follow up CT abd/pelvis   - Follow up pleural fluid cytology   - Obtain bilateral femur xrays given bilateral leg pain that is concerning for metastatic disease  - Neurosurgery consulted, appreciate recommendations  - Recommend radiation oncology consult   - If thoracentesis is non-diagnostic, would recommend IR consult for C7 biopsy  - Consider thyroid ultrasound if above workup is non-diagnostic     Thank  you for allowing us  to participate in the care of this patient. We will continue to follow. Please page oncology fellow if questions arise. Patient seen and discussed with attending Dr. Adina . Attestation to follow.         Pj Zehner, DO  Hematology/Oncology Fellow PGY-4  [facility], [place]

## 2023-10-01 NOTE — H&P (Signed)
 Department of Neurosurgery  Consultation Note           Reason for Consult: Cervical osteomyelitis versus malignancy    On-Call Attending: Dr. Kandis    Date/Time Paged: 10/01/2023      Date/Time Patient Evaluated: 10/01/2023     History of Present Illness: 63 y.o. male with congestive heart failure, end-stage renal disease presenting with bilateral shoulder pain, right lower extremity pain resulting in difficulty ambulating.  Patient found to have diffuse osseous mets, as well as pleural effusion.  Neurosurgery initially consulted due to concern for metastatic cancer of unknown origin.    Imaging findings concerning for possible infection.  Inflammatory markers elevated with CRP of 300 ESR of 121.    Past Medical History:   Past Medical History[1]    Past Surgical History:   Past Surgical History[2]    Family History:   Family History[3]    Patient  reports that he quit smoking about 31 years ago. His smoking use included cigarettes. He has a 4.5 pack-year smoking history. He has never used smokeless tobacco. He reports that he does not drink alcohol and does not use drugs.    Allergies:   Allergies[4]    Medications:  Medications Prior to Admission   Medication Sig    midodrine  (PROAMATINE ) 5 mg tablet Take 3 tablets (15 mg total) by mouth 3 times daily.    magnesium  oxide (MAGNESIUM  OXIDE -MG SUPPLEMENT) 400 (241.3 mg) mg tablet Take 1 tablet (400 mg total) by mouth daily.    acetaminophen  (TYLENOL ) 325 MG tablet Take 2 tablets (650 mg total) by mouth every 4-6 hours as needed for Pain.    aspirin  81 MG EC tablet Take 1 tablet (81 mg total) by mouth daily.    fluticasone  (FLONASE ) 50 MCG/ACT nasal spray Spray 1 spray into nostril daily.    atorvastatin  (LIPITOR) 10 MG tablet Take 2 tablets (20 mg total) by mouth daily (with dinner).    fludrocortisone  (FLORINEF ) 0.1 mg tablet Take 1 tablet (0.1 mg total) by mouth 3 times daily.    b complex-vitamin c-folic acid  (NEPHRO-VITE) tablet Take 1 tablet by mouth  daily.    calcium -vitamin D  (OSCAL-500) 500-200 MG-UNIT per tablet Take 1 tablet by mouth 2 times daily.    cinacalcet  (SENSIPAR ) 30 MG tablet Take 1 tablet (30 mg total) by mouth daily.    omeprazole  (PRILOSEC) 20 MG capsule Take 2 capsules (40 mg total) by mouth daily (before breakfast).     Current Facility-Administered Medications   Medication Dose Route Frequency    heparin  (porcine) 1,000 units/mL injection 0-5,000 units  0-5 mL Intracatheter PRN    heparin  (porcine) 1,000 units/mL injection 0-5,000 units  0-5 mL Intracatheter PRN    lidocaine  HCL 1 % injection 10 mL  10 mL Subcutaneous Once    sodium chloride  0.9 % FLUSH REQUIRED IF PATIENT HAS IV  0-500 mL/hr Intravenous PRN    dextrose  5 % FLUSH REQUIRED IF PATIENT HAS IV  0-500 mL/hr Intravenous PRN    acetaminophen  (TYLENOL ) tablet 1,000 mg  1,000 mg Oral Q8H PRN    aspirin  EC tablet 81 mg  81 mg Oral Daily    atorvastatin  (LIPITOR) tablet 20 mg  20 mg Oral Daily with dinner    B complex-vitamin C-folic acid  (NEPHRO-VITE) tablet 1 tablet  1 tablet Oral Daily    calcium -vitamin D  (OSCAL-500) 500mg -200unit per tablet 1 tablet  1 tablet Oral BID    cinacalcet  (SENSIPAR ) tablet 30 mg  30  mg Oral Daily    fludrocortisone  (FLORINEF ) tablet 0.1 mg  0.1 mg Oral TID    fluticasone  (FLONASE ) 50 MCG/ACT nasal spray 1 spray  1 spray Nasal Daily    midodrine  (PROAMATINE ) tablet 15 mg  15 mg Oral TID    omeprazole  (PriLOSEC) capsule 40 mg  40 mg Oral QAM    Lidocaine  4 % patch 2 patch  2 patch Transdermal Q24H    heparin  (porcine) SQ injection 5,000 units  5,000 units Subcutaneous TID       Review of Systems: As above, otherwise 12 point review of systems negative.     Neurological Examination  GCS: [id number]  Mental Status: Awake and alert. Oriented to person, place, and time. Fluent. Comprehension intact. Affect appropriate.  Cranial Nerves:       II: Pupils 3 mm and equally reactive    III/IV/VI: Versions intact without nystagmus, no gaze preference.    VII: Facial  expression symmetric   Sensory Exam: Sensation intact to light touch throughout    Motor:   There were no abnormal movements. Strength to confrontation:      Left Right   C5 Shoulder abductor 4 4   C6 Elbow flexor 4 5   C7 Elbow extensor 4+ 5   C8 Finger flexors 5 5   T1 Finger abductor 5 5   L2 Hip flexors 5 5   L3 Knee extensors 5 5   L4 Ankle dorsiflexors 5 5   L5 Extensor hallucis longus 5 5   S1 Ankle plantar flexors 5 5        Reflexes  Left Right   Biceps  2+ 2+   Patella  2+ 2+   Ankle  2+ 2+   Hoffman None None   Clonus None None       Lab Results  Recent Labs     10/01/23  0122 09/30/23  1943 09/30/23  0625 09/29/23  1827 09/29/23  0327   WBC 15.6*  --  14.7*  --  14.3*   Hematocrit 37  --  37  --  35*   Platelets 527*  --  497*  --  507*   Sodium 137  --  135  --  137   Potassium 5.5*  --  5.2*  --  5.1   CO2 26  --  29*  --  29*   INR  --  1.4*  --   --   --    Sedimentation Rate  --   --  121*  --  121*   CRP  --   --  338* 365*  --      No components found with this basename: BUN, OSMOLALITY      Imaging Studies:  MRI Scan (personal reads)  Diffuse osseous mets, epidural enhancement concerning for phlegmon at the C7-T1 level with extension into the left C7 foramen      Currently Active/Followed Hospital Problems:  Active Hospital Problems    Diagnosis     Diarrhea     Failure to thrive in adult        Assessment:  63 y.o. male with likely cervical osteomyelitis versus possible malignancy.  Patient has elevated plantar markers ESR 121, CRP of 300.  Patient denies fevers and chills.    Plan:   - Infectious workup  - Blood cultures  - IR biopsy of most safe and feasible lesion  - Hold aspirin   - Oncology workup ongoing  Date/Time the Neurosurgical Team Communicated Recommendations with Emergency Department or Primary Service: 10/01/2023      Thank you for consulting neurosurgery.    AuthorBETHA DENNISE SLEDGE, MD as of: 10/01/2023  at: 2:08 PM    Please page the on-call Neurosurgery resident for any  questions regarding this patient's care.    Please page the Neurosurgery House Officer for any questions regarding this patient's care.         [1]   Past Medical History:  Diagnosis Date    Arthritis     CHF (congestive heart failure)     Colon polyp     Dialysis patient     ESRD (end stage renal disease)     GERD (gastroesophageal reflux disease)     Hypercholesterolemia     Hypotension     Ischemic colitis     Thyroid disease     hypo   [2]   Past Surgical History:  Procedure Laterality Date    DIALYSIS FISTULA CREATION Bilateral     4 fistulas have failed- ? d/t low BP and small veins    HERNIA REPAIR      Right inguinal hernia repair     [3]   Family History  Problem Relation Name Age of Onset    Diabetes Mother      Asthma Mother      Coronary artery disease Father     [4]   Allergies  Allergen Reactions    Penicillins Swelling     Face    Coconut Flavoring Agent (Non-Screening) Nausea And Vomiting    Amoxicillin Rash    Baclofen Other (See Comments)     confusion    No Known Latex Allergy

## 2023-10-01 NOTE — Progress Notes (Signed)
 Report Given To  Bedside Nurse    Pt tolerated 4hrs of treatment  2liters of fluid removed  Tolerated without complications or concerns  HDTC dressing changed this tx, DCD on discharge       Descriptive Sentence / Reason for Admission   Duration of Treatment (minutes): 240 minutes  Complications: none        Active Issues / Relevant Events       Review of medications administered  such as routine meds, antibiotics, and heparin : see MAR  Vital Signs: see Flowsheet        To Do List  Medications still needing administration: n/a  Dressing change due : 10/08/2023      Anticipatory Guidance / Discharge Planning  Date/Time of Next Treatment: TBD    Please weigh patient daily to monitor fluid gains-Thanks    Suzen Rummer, RN

## 2023-10-01 NOTE — Progress Notes (Signed)
 Report Given To  Addie CROME., RN        Descriptive Sentence / Reason for Admission   Team:    Presented with:intermittent (L) shoulder pain x 2 weeks. Inability to ambulate x 1 week r/t (R) leg pain. + muscle aches + fatigue + weight loss + night sweats + poor PO intake  Relevant PMHx:ESRD HD (T,Th,S) Osteoarthritis, Hypotension  [id number]           Active Issues / Relevant Events   Shift Event (last 24 hours):  7/21 N: admission completed, complaint of R arm pain, MRI done this shift, 1 BM using shower care to transfer did well, labs completed. Pt slept well. Encouraged po intake and able to make needs known.   Precautions:        AOX:X4  Mobility: Cane  Pills: Whole  Meals: Independent  Continent: (yes/no)  IV:     (drsg due__)   (Line cap change due__)  IVF/gtts:     Drains/tubes/O2/Bipap/CPAP:     Patient Behaviors:Calm           To Do List  VS:Q4  Tele: No   Sticker Change Daily []   Wound Care:  Labs/Tests: ESRD, dialysis T/Th/Sat (received dialysis 07/19)   BGs:                                                                             Daily Weights: no                                            Daily Bath (soap & water /CHG): ___    Restraints: (no)  Violent or nonviolent  When restraints order renewal needed:___         Anticipatory Guidance / Discharge Planning  Dentures: Uppers []  Lowers [] , Hearing Aids R[]  L[] , Glasses []   Belongings in [employer] Office []   Support Person/Contact:   Tasks Pending:  Education Needed:                                                                          D/C to:   Renea Ona, RN

## 2023-10-01 NOTE — Progress Notes (Signed)
 Nephrology  10/01/2023 8:40 AM    Shane Pearson was seen and examined on dialysis.    Interval Events:    No acute events overnight.  Reports two months of progressive anorexia, fatigue, and weight loss.      Objective:  Hzw:Jtjxz, alert, oriented ; lucid; NAD  Blood pressure 103/61, pulse 90, temperature 36.3 C (97.3 F), resp. rate 18, weight 79.9 kg (176 lb 2.4 oz), SpO2 94%.  Eyes:  Conj pale; gaze conjugate; no scleral icterus  ENT: Normal appearing facial features  Chest:  Clear with normal effort   ; An HDTC is in the right Chest  CVS: reg; no rub; no edema        Recent Labs   Lab 10/01/23  0122 09/30/23  0625 09/29/23  0327   Sodium 137 135 137   Potassium 5.5* 5.2* 5.1   Chloride 90* 89* 90*   CO2 26 29* 29*   UN 60* 48* 26*   Creatinine 12.10* 10.50* 7.38*   Glucose 82 84 81   Calcium  9.0 9.0 9.1   Albumin 3.4* 3.4* 3.4*   Phosphorus 4.6* 4.6* 3.9      Recent Labs   Lab 10/01/23  0122 09/30/23  0625 09/29/23  0327   WBC 15.6* 14.7* 14.3*   Hemoglobin 10.9* 10.8* 10.1*   Hematocrit 37 37 35*   Platelets 527* 497* 507*          CT chest without contrast  Result Date: 09/29/2023  Moderate right and small left layering pleural fluid, with small fluid tracking along the perifissural regions of the lingula and right lower lobe. This results in atelectasis of the right greater than left lower lobe. Multiple enlarged mediastinal lymph nodes of indeterminate etiology. PET/CT could be considered if clinically appropriate. Follow-up chest CT after resolution of acute symptomology or 8 to 12 weeks is also advised. Scattered pulmonary nodules measuring up to 4 mm, again of indeterminate clinical significance. These can be reassessed for at the time of follow-up imaging per clinical guidance, or at the time of recommended follow-up chest imaging as above. Right chest wall subcutaneous soft tissue density nodules may represent enlarged lymph nodes. Incidental polycystic configuration of the bilateral kidneys. END OF  IMPRESSION I have personally reviewed the images and the Resident's/Fellow's interpretation and agree with or edited the findings.     *Chest standard frontal and lateral views  Result Date: 09/29/2023  Persistent small bilateral pleural effusions with presumed adjacent parenchymal atelectasis, slightly increased.. END OF IMPRESSION I have personally reviewed the images and the Resident's/Fellow's interpretation and agree with or edited the findings.     CT cervical spine without contrast  Result Date: 09/29/2023  Renal osteodystrophy. Asymmetric patchy lucency within the C7 vertebral body extending into the left transverse process and left facet. There is also asymmetric soft tissue prominence in this area compared to the right side. This appears atypical for fracture, however given the patient's left-sided radicular symptoms a cervical spine MRI may be of value to further assess this finding. No obvious spinal canal or significant neuroforaminal narrowing by CT. Large right and moderate left pleural effusions, radiographically occult on prior chest x-ray. This may be related to volume overload in the setting of ESRD. END OF IMPRESSION I have personally reviewed the images and the Resident's/Fellow's interpretation and agree with or edited the findings.     CT head without contrast  Result Date: 09/29/2023  No CT evidence of acute intracranial abnormality. Suspected odontogenic related  sinus disease in setting of adjacent periapical lucencies of the maxillary molars. Recommend correlation with dental evaluation. END OF IMPRESSION     *Chest standard frontal and lateral views  Result Date: 09/28/2023  Right basilar airspace opacity, probably atelectasis in setting of low lung volumes. However, infection or aspiration could have a similar imaging appearance. Minimal blunting of the right costophrenic angle, probably representing a trace pleural effusion. END OF IMPRESSION I have personally reviewed the images and the  Resident's/Fellow's interpretation and agree with or edited the findings.            Assessment/Plan  Shane Pearson is a 63 y.o. Male  ESRD on HD admitted for chest pain, weight loss, anorexia on 7/19.  Work-up concerning of malignancy.      Plan:  1. Hemodialysis Note:   Presently dialyzing for 3 hours:     F180 Na 138 K 2 Ca 2.5 HCO3 35 Qb: 400 ml/min Qd: 1.5X Qb UF 2 L as hemodynamics tolerates     EDW = TBD.  Low potassium, low phosphorus, low sodium diet please  Dose all medications for creatinine clearance < 10 ml/min   Limit beverage intake to 1.5 L per day  Give Nephrovite or similar renal vitamin daily  Active Dialysis Orders   Dialysis    Hemodialysis     Frequency: Every Tues,Thurs & Sat     Start Date/Time: 10/03/23 0600      Number of Occurrences: Until Specified     Order Questions:      Dialyzer F180      K/Ca (meq) 2K/2.5Ca      Na (meq) 135      Na Modeling None      Bicarb (meq) 30      Dialysate Temp (C) 36      BFR (mL/min) 400      DFR (mL/min) 1.5x BFR      Duration (hours) 3      EDW (kgs) TBD      Access CVC      Vital Signs Vitals signs at the start of treatment and every 30 minutes      Vital Signs Check temperature at the start of treatment and at the end of treatment      Monitoring May place patient on telemtry per nursing descretion for concerns regarding stability      Notify dialysis provider for Pulse Less Than 50      Notify dialysis provider for Pulse Greater Than 130      Notify dialysis provider for Temperature (Celsius) Above 38      Notify dialysis provider for Systolic Blood Pressure Below 90      Notify dialysis provider for Systolic Blood Pressure Above 819      Fluid removal (L) 0.5-3L , map over 65      Fluid removal instructions Decrease fluid removal goal by 500 ml  if blood pressure is decreasing or HR increasing. Reassess response every 15 minutes.      Fluid removal instructions May increase fluid removal goal by 500 ml if blood pressure and HR are stable.  Reassess  response every 15 minutes.      Fluid removal instructions Decrease fluid removal to minimum if call/hold parameters for BP and/or HR exceeded.  Reassess response every 15 minutes.      Fluid removal instructions If no BP/HR response in 15 minutes after notifying MD, give 200 ml isotonic saline.  Reassess response every 15 minutes.  2.  Anemia:   Holding epogen at this time.      3.  Hypertension and volume:   On Midodrine .  U.F. as able.    4.  Hyperkalemia:  2 k bath at H.d.  Low K diet when eating    5.  Hyperparathyroidism and hyperphosphatemia:    Phos level acceptable  Continue low phos diet.  Continue sensipar .    6.   Dialysis Access: CVC in place     Author: CHARLIE CATER, MD  as of: 10/01/2023  at: 8:40 AM

## 2023-10-01 NOTE — Progress Notes (Signed)
 OT session attempted, however was unable to be completed as pt off unit.  OT will plan to follow-up at a later time, however if needs arise in the interim please contact the OT via Secure Chat.      Thank you.  Rosaline DELENA Lee, OT  Please contact the OT via Secure Chat to: [facility] First Call with any questions/concerns and/or update requests.  10/01/2023

## 2023-10-01 NOTE — Procedures (Addendum)
 Procedure note    Date/Time: 10/01/2023 3:16 PM    Performed by: Juliaette Kelly SAILOR, NP  Authorized by: Deette Zachary BIRCH, NP          DIAGNOSTIC THORACENTESIS PROCEDURE NOTE    Time out completed:  Yes    Indications  right side pleural effusion    Consent signed: Yes: discussed potential complications including bleeding, pain, infection, damage to surrounding structures.    Pre-procedure intervention: Answered questions. Review of pertinent imaging and labs. POCUS to identify pertinent anatomy.     Procedure Details:  The patient was placed in a sitting position at the side of the bed. Ultrasound assessment was performed to identify adequate pocket of fluid and vasculature in vicinity of site. Patient was prepped and draped in normal sterile fashion.  6ml 1% lidocaine  was used to anesthetize the chosen right sided rib space. Fluid was obtained without any difficulties. Pleural manometry was performed throughout the procedure. A dressing was applied to the wound.    Patient tolerated the procedure well with no immediate complications.   Opening pleural pressure: 8   500cc: 3  Pulmonary elastance pressure 10       Fluid removal stopped due to : spontaneous stoppage of flow    Ultrasound and vascular assessment was performed and images were stored and uploaded to PACS/e-view.    Findings  700 ml of amber colored pleural fluid was obtained.     Skin to fluid distance: 2 cm    Kit used:  Pleura-seal    Condition  stable    Plan/Orders  STAT portable chest x-ray was ordered and we will review.    EBL  <5 cc    Specimens obtained: yes as per primary team orders.    Images were stored and uploaded to PACS/e-view.    Pre procedure imaging shared with attending physician.     Thank you for the consult.     Procedure was performed by Kelly Juliaette NP.     Kelly Juliaette, NP   Hospital Medicine Procedure Team  10/01/23 3:16 PM    Please page PIC [phone] or call [phone]  with any questions/concerns.

## 2023-10-01 NOTE — Progress Notes (Addendum)
 10/01/23 0801   Treatment Attempt   Treatment Attempt Patient currently off unit at dialysis. Writer will reattempt at later time as schedule allows.         Shyla Gayheart, Physical Therapist Assistant    (Please contact physical therapist on the treatment team via secure chat or via the secure chat group for your unit Physical Therapist (PT) with any questions.  On weekends/holidays, please utilize the secure chat group, [facility] 1st call, to contact PT.)

## 2023-10-01 NOTE — Progress Notes (Signed)
 Patient updated at bedside with HMD APP on new MRI results.  Wife Shane Pearson was called [phone]) to discuss the results together.  Updated wife on discussion during rounds including C7 lucency of the vertebral body, mediastinal lymphadenopathy, bilateral pleural effusions.  Then updated wife and patient together regarding MRI results with multiple osseous lesions throughout the cervical spine with extension into soft tissue/epidural space concerning for metastatic disease.  Expressed this is suspicious for malignancy, but we would need biopsy/pathology to confirm and the appropriate specialists on board for further evaluation and workup.  Spent time empathizing with patient and family, affirming that hearing the word cancer is scary, brings up many fears and questions.  Encouraged to write down all question/thoughts to discuss in an ongoing fashion.  If it is cancer, Shane Pearson reports, I want to do everything I can to fight it and go back to work.  Alerted we will obtain heme-onc and neurosurgery consultation.  Still awaiting thoracentesis procedure with cytology.  Confirmed we will control pain and adjust pain regimen as needed.  All questions/concerns answered and discussed.    Time spent: 20 minutes

## 2023-10-01 NOTE — Progress Notes (Addendum)
 Hospital Medicine Service Attending Progress Note    Significant Events/Subjective:   Patient was admitted 7/20 for history difficulty ambulating, migrating myalgias, unintentional weight loss with fatigue and night sweats, atraumatic left shoulder pain, concerning for infection, rheumatologic process or malignancy.  CT C-spine was obtained showing lucency in C7 vertebral body, prompting MRI which is pending.  CT chest was obtained after initial chest x-ray demonstrating questionable pleural effusion, CT demonstrating mediastinal lymphadenopathy with layered large right pleural effusion, moderate left pleural effusion-patient is currently pending thoracentesis with cytology.  Chest CT also demonstrated multiple 4 mm pulmonary nodules, bilateral polycystic kidneys (in setting of ESRD with HD) these findings were discussed with patient this morning with all questions answered.    The patient elaborated on symptoms, reporting his biggest concerns are difficulty walking, fatigue and left shoulder pain.  Notes right shoulder pain began in 02/2023 that progressed to left shoulder pain with pain radiating down his left arm in 04/2023.  He was given steroids which he reported did not help.  He then developed difficulty walking approximately 6 weeks ago with the left lower extremity that progressed to the right lower extremity -denies weakness, sensory changes, but notes he began ambulating with crutches due to significant pain.  Also reports fatigue X 6 weeks with unintentional weight loss of 11 pounds in 3 months with decreased appetite.  He suddenly developed blurry vision with vertical diplopia 09/18/2023, for which she is being evaluated for now.  Notes when he closes 1 eye he no longer experiences double vision.  Denies eye redness or pain.  Also notes he has been having shortness of breath over the last several weeks.    Currently, receiving HD in dialysis unit.  Evaluated on rounds with HMD APP.  Notes he does  currently have left shoulder pain that was significantly exacerbated during MRI this morning.  Denies chest pain, shortness of breath, nausea/vomiting/abdominal pain currently.  Ensure that we will update him with any additional findings from the MRI and thoracentesis.  Attempted calling his wife with him at bedside, however she did not answer.  Will attempt again later.    Objective:     Physical Exam  Temp:  [35.9 C (96.6 F)-37 C (98.6 F)] 36.5 C (97.7 F)  Heart Rate:  [78-92] 92  Resp:  [18-24] 24  BP: (89-111)/(54-92) 99/61  79.9 kg (176 lb 2.4 oz)    Constitutional: Laying in bed, talkative, no acute distress, alert  Neck: No cervical lymphadenopathy, supple  Eyes: Cataracts, nonicteric sclera, no drainage  ENT: Dry oral mucosa, nares patent  CV: Regular rate, regular rhythm, no murmur/rub/gallops, no pedal edema  Resp: Normal work of breathing on room air, decreased aeration bilaterally without adventitious lung sounds  GI: Abdomen soft, nontender, no organomegaly  GU: No Foley  MSK: Able to move all extremities spontaneously, normal bulk  Skin: Warm, dry, intact  Neuro: AO x 4  Psych: Good insight, fair mood, reasonable    Recent Lab Studies:  Personally reviewed and notable for:  Recent Labs   Lab 10/01/23  0122 09/30/23  0625 09/29/23  0327   WBC 15.6* 14.7* 14.3*   Hemoglobin 10.9* 10.8* 10.1*   Hematocrit 37 37 35*   Platelets 527* 497* 507*      Recent Labs   Lab 10/01/23  0122 09/30/23  0625 09/29/23  0327   Sodium 137 135 137   Potassium 5.5* 5.2* 5.1   Chloride 90* 89* 90*   CO2 26 29*  29*   UN 60* 48* 26*   Creatinine 12.10* 10.50* 7.38*   Glucose 82 84 81   Calcium  9.0 9.0 9.1   Albumin 3.4* 3.4* 3.4*   Phosphorus 4.6* 4.6* 3.9     Recent Labs   Lab 09/30/23  1943   INR 1.4*   Protime 15.7*       Recent Imaging Studies:  MR cervical spine without and with contrast  Result Date: 10/01/2023  Decreased sensitivity/specificity secondary to patient motion and poor signal on several sequences.  Expansile enhancing lesion within the C7 vertebral body with soft tissue components extending into the prevertebral space and the left anterolateral epidural space. Additionally, there is suspected soft tissue component extending into the left C6-C7 and C7-T1 neuroforamen which causes severe left foraminal narrowing at C7-T1 and moderate to severe left foraminal narrowing at C6-C7. Associated trace fluid within the prevertebral space. Overall, findings are suspicious for osseous metastatic disease. Additional enhancing lesions within the left suboccipital musculature at the level of C4. Possible additional subcentimeter lesions within the left paraspinal musculature at the level of C3-C4 and the left posterior neck base. These are suspicious for additional metastatic lesions. Borderline enlarged right level 2 lymph node with slightly rounded morphology. Attention on follow-up imaging is recommended. Prominent STIR hyperintensity along the anteroinferior endplate of C4 with ill-defined enhancement, favoring degenerative changes. Attention on follow-up imaging is recommended. Partially visualized moderate to large pleural effusions. END OF IMPRESSION     CT chest without contrast  Result Date: 09/29/2023  Moderate right and small left layering pleural fluid, with small fluid tracking along the perifissural regions of the lingula and right lower lobe. This results in atelectasis of the right greater than left lower lobe. Multiple enlarged mediastinal lymph nodes of indeterminate etiology. PET/CT could be considered if clinically appropriate. Follow-up chest CT after resolution of acute symptomology or 8 to 12 weeks is also advised. Scattered pulmonary nodules measuring up to 4 mm, again of indeterminate clinical significance. These can be reassessed for at the time of follow-up imaging per clinical guidance, or at the time of recommended follow-up chest imaging as above. Right chest wall subcutaneous soft tissue density  nodules may represent enlarged lymph nodes. Incidental polycystic configuration of the bilateral kidneys. END OF IMPRESSION I have personally reviewed the images and the Resident's/Fellow's interpretation and agree with or edited the findings.     *Chest standard frontal and lateral views  Result Date: 09/29/2023  Persistent small bilateral pleural effusions with presumed adjacent parenchymal atelectasis, slightly increased.. END OF IMPRESSION I have personally reviewed the images and the Resident's/Fellow's interpretation and agree with or edited the findings.     CT cervical spine without contrast  Result Date: 09/29/2023  Renal osteodystrophy. Asymmetric patchy lucency within the C7 vertebral body extending into the left transverse process and left facet. There is also asymmetric soft tissue prominence in this area compared to the right side. This appears atypical for fracture, however given the patient's left-sided radicular symptoms a cervical spine MRI may be of value to further assess this finding. No obvious spinal canal or significant neuroforaminal narrowing by CT. Large right and moderate left pleural effusions, radiographically occult on prior chest x-ray. This may be related to volume overload in the setting of ESRD. END OF IMPRESSION I have personally reviewed the images and the Resident's/Fellow's interpretation and agree with or edited the findings.     CT head without contrast  Result Date: 09/29/2023  No CT evidence of  acute intracranial abnormality. Suspected odontogenic related sinus disease in setting of adjacent periapical lucencies of the maxillary molars. Recommend correlation with dental evaluation. END OF IMPRESSION     *Chest standard frontal and lateral views  Result Date: 09/28/2023  Right basilar airspace opacity, probably atelectasis in setting of low lung volumes. However, infection or aspiration could have a similar imaging appearance. Minimal blunting of the right costophrenic angle,  probably representing a trace pleural effusion. END OF IMPRESSION I have personally reviewed the images and the Resident's/Fellow's interpretation and agree with or edited the findings.       Assessment:     Shane Pearson is a  63 y.o. male with ESRD (on HD TTS), hypotension, and lumbosacral spondylosis who presented to the ED for chest pain and left shoulder pain. Initial work up was notable for mild leukocytosis, chronic anemia, thrombocytosis, elevated NT-pro BNP, elevated but adynamic troponin, elevated AST and Alk Phos, and ESRD.  Initial chest x-ray demonstrating right sided opacity, concern for atelectasis and minimal right pleural effusion.  He was admitted for constitutional symptoms and workup concerning for occult infectious process, rheumatologic disorder or malignancy.  Additional imaging overnight suggests concern for malignancy, considering mediastinal lymphadenopathy, C7 lucency of vertebral body with constitutional symptoms.  He is pending thoracentesis, that will be sent for cytology and MRI read of cervical spine is pending.  Patient is currently clinically stable.    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 Added on uric acid for TLS evaluation  -Concerning for underlying malignancy.  This was discussed with patient.  Prior to completion of this note, MRI results returned indicating multiple osseous metastatic lesions throughout cervical spine.  This was discussed with patient who is aware and understands high concern for malignancy, but cannot confirm without biopsy.  No obvious primary at the moment.   -Will obtain thoracic and lumbar MRI for further disease  -Will obtain abdominal CT with contrast to assess for primary  -Will need to consider biopsy with IR for pathology  - Consult heme/onc for  suspected malignancy of unclear primary; if PET scan inpatient possible?  -Consults neurosurgery for severe foraminal narrowing with metastatic osseous lesions  -Pain control: 1000 mg Tylenol  scheduled 3 times daily, breakthrough oral oxycodone  as needed for bone pain.  Can progress to IV dilaudid  as needed (ESRD)    Large right pleural effusion  Moderate left pleural effusion  -Appears layered on CT  -Pending therapeutic and diagnostic thoracentesis with cytology, gram stain, culture; will utilize lights criteria when able    Vertical diplopia  -Ophthalmology following, planning for biopsy 7/25 for GCA  -Wonder if there could be infiltration of cranial nerves and if additional brain imaging with MRI would be warranted considering acute onset and setting of potential malignancy.  No obvious tumor seen on CT head  -ESR/CRP trend every 3 days    Subacute diarrhea  -Stool studies negative     Additional concerns-  -periodontal disease seen on CT-dental evaluation outpatient  -Multiple pulmonary nodules 4 mm seen on CT-considering workup for malignancy, may not need to evaluate these further outpatient    Chronic problems-  Chronic hypotension-continue home midodrine  home fludrocortisone   GERD- continue home Prilosec 20 mg  Hyperlipidemia - Continue atorvastatin   Metabolic bone disease-continue Cinacalcet   Is on ASA 81mg  dating back  to 2020 per chart review -unclear reasoning. If for primary prevention, will need to discontinue permanently. Will assess further with patient - per prior doc, does not appear to have hx of CVA/MI  Allergic rhinitis - Continue flonase     DVT PPx: Heparin  subcu    Emergency Contact: [name] (spouse/HCP) [phone]   Code Status: Full code    Discharge Planning:   MRDD: 10/05/2023  Discharge Criteria/Barriers to Discharge: Further workup of potential malignancy including biopsy/outpatient connection  PT and/or OT Recommendations/Discharge to: Will hold off until closer to  discharge  Appointments Needed with: PCP, likely heme-onc, neurosurg, ophthalmology    Case discussed with HMD APP, neurosurgery, heme-onc, interdisciplinary team    I personally spent 50 minutes on the calendar day of the encounter, including pre and post visit work.      Rainn Zupko, MD on 10/01/2023 at 2:19 PM

## 2023-10-02 ENCOUNTER — Inpatient Hospital Stay

## 2023-10-02 ENCOUNTER — Encounter: Payer: Self-pay | Admitting: Gastroenterology

## 2023-10-02 LAB — HEPATIC FUNCTION PANEL
ALT: 8 U/L (ref 0–50)
AST: 49 U/L (ref 0–50)
Albumin: 3.2 g/dL — ABNORMAL LOW (ref 3.5–5.2)
Alk Phos: 210 U/L — ABNORMAL HIGH (ref 40–130)
Bilirubin,Direct: <0.2 mg/dL (ref 0.0–0.3)
Bilirubin,Total: 0.3 mg/dL (ref 0.0–1.2)
Total Protein: 6.4 g/dL (ref 6.3–7.7)

## 2023-10-02 LAB — BASIC METABOLIC PANEL
Anion Gap: 16 (ref 7–16)
Anion Gap: 17 — ABNORMAL HIGH (ref 7–16)
Anion Gap: 18 — ABNORMAL HIGH (ref 7–16)
CO2: 21 mmol/L (ref 20–28)
CO2: 21 mmol/L (ref 20–28)
CO2: 21 mmol/L (ref 20–28)
Calcium: 8.8 mg/dL (ref 8.6–10.2)
Calcium: 9 mg/dL (ref 8.6–10.2)
Calcium: 8.4 mg/dL — ABNORMAL LOW (ref 8.6–10.2)
Chloride: 94 mmol/L — ABNORMAL LOW (ref 96–108)
Chloride: 94 mmol/L — ABNORMAL LOW (ref 96–108)
Chloride: 94 mmol/L — ABNORMAL LOW (ref 96–108)
Creatinine: 8.73 mg/dL — ABNORMAL HIGH (ref 0.67–1.17)
Creatinine: 9.14 mg/dL — ABNORMAL HIGH (ref 0.67–1.17)
Creatinine: 10.1 mg/dL — ABNORMAL HIGH (ref 0.67–1.17)
Glucose: 75 mg/dL (ref 60–99)
Glucose: 67 mg/dL (ref 60–99)
Glucose: 74 mg/dL (ref 60–99)
Lab: 41 mg/dL — ABNORMAL HIGH (ref 6–20)
Lab: 44 mg/dL — ABNORMAL HIGH (ref 6–20)
Lab: 50 mg/dL — ABNORMAL HIGH (ref 6–20)
Potassium: 6.3 mmol/L (ref 3.3–5.1)
Potassium: 5 mmol/L (ref 3.3–5.1)
Potassium: 5.4 mmol/L — ABNORMAL HIGH (ref 3.3–5.1)
Sodium: 131 mmol/L — ABNORMAL LOW (ref 133–145)
Sodium: 132 mmol/L — ABNORMAL LOW (ref 133–145)
Sodium: 133 mmol/L (ref 133–145)
eGFR BY CREAT: 6 — AB
eGFR BY CREAT: 6 — AB
eGFR BY CREAT: 5 — AB

## 2023-10-02 LAB — CBC AND DIFFERENTIAL
Baso # K/uL: 0.1 THOU/uL (ref 0.0–0.2)
Eos # K/uL: 0.4 THOU/uL (ref 0.0–0.5)
Hematocrit: 34 % — ABNORMAL LOW (ref 37–52)
Hemoglobin: 9.7 g/dL — ABNORMAL LOW (ref 12.0–17.0)
IMM Granulocytes #: 0.2 THOU/uL — ABNORMAL HIGH
IMM Granulocytes: 1.2 %
Lymph # K/uL: 1.7 THOU/uL (ref 1.0–5.0)
MCV: 84 fL (ref 75–100)
Mono # K/uL: 1.8 THOU/uL — ABNORMAL HIGH (ref 0.1–1.0)
Neut # K/uL: 11.2 THOU/uL — ABNORMAL HIGH (ref 1.5–6.5)
Nucl RBC # K/uL: 0 THOU/uL (ref 0.0–0.1)
Nucl RBC %: 0 /100{WBCs} (ref 0.0–0.2)
Platelets: 451 THOU/uL — ABNORMAL HIGH (ref 150–450)
RBC: 4 MIL/uL (ref 4.0–6.0)
RDW: 19 % — ABNORMAL HIGH (ref 0.0–15.0)
Seg Neut %: 73.3 %
WBC: 15.3 THOU/uL — ABNORMAL HIGH (ref 3.5–11.0)

## 2023-10-02 LAB — POCT GLUCOSE
Glucose POCT: 77 mg/dL (ref 60–99)
Glucose POCT: 85 mg/dL (ref 60–99)

## 2023-10-02 LAB — PHOSPHORUS: Phosphorus: 4.6 mg/dL — ABNORMAL HIGH (ref 2.7–4.5)

## 2023-10-02 LAB — MAGNESIUM: Magnesium: 1.8 mg/dL (ref 1.6–2.5)

## 2023-10-02 MED ORDER — PATIROMER SORBITEX CALCIUM 16.8 G PO PACK *I*
16.8000 g | PACK | Freq: Every day | ORAL | Status: DC
Start: 2023-10-02 — End: 2023-10-08
  Administered 2023-10-02 – 2023-10-03 (×2): 16.8 g via ORAL
  Filled 2023-10-02 (×8): qty 1

## 2023-10-02 MED ORDER — DEXTROSE 50 % IV SOLN *I*
25.0000 g | Freq: Once | INTRAVENOUS | Status: AC
Start: 2023-10-02 — End: 2023-10-02
  Administered 2023-10-02: 25 g via INTRAVENOUS
  Filled 2023-10-02: qty 50

## 2023-10-02 MED ORDER — CALCIUM GLUCONATE 9.4 MEQ (2,000 MG) IN NS 110 ML *I*
9.4000 meq | Freq: Once | INTRAVENOUS | Status: AC
Start: 2023-10-02 — End: 2023-10-02
  Administered 2023-10-02: 9.4 meq via INTRAVENOUS
  Filled 2023-10-02: qty 9.4

## 2023-10-02 MED ORDER — INSULIN REGULAR HUMAN 100 UNIT/ML IJ SOLN *I*
10.0000 [IU] | Freq: Once | INTRAMUSCULAR | Status: AC
Start: 2023-10-02 — End: 2023-10-02
  Administered 2023-10-02: 10 [IU] via INTRAVENOUS

## 2023-10-02 MED ORDER — POLYETHYLENE GLYCOL 3350 PO PACK 17 GM *I*
17.0000 g | PACK | Freq: Every day | ORAL | Status: DC
Start: 2023-10-02 — End: 2023-10-10
  Filled 2023-10-02 (×5): qty 17

## 2023-10-02 MED ORDER — DEXTROSE 50 % IV SOLN *I*
25.0000 g | INTRAVENOUS | Status: AC | PRN
Start: 2023-10-02 — End: 2023-10-02

## 2023-10-02 NOTE — Plan of Care (Signed)
 HMD APP Plan of Care Note:    Notified this morning that neurosurgery recommended a stat IR consult for a C7 biopsy. NSGY concerned the MRI findings are consistent with possible cervical osteo. Radiology called overnight by covering APP and they confirmed the read and felt that the findings are more consistent with metastasis and not infection.     Given NSGY concern for cervical osteo, placed IR consult order. Discussed with IR APP who reviewed the case with the Neuro IR attending. Per the attending they could biopsy the C7 facet but the vertebral body and endplate would be hard to get to - they recommend holding on the biopsy until the complete MRI of the spine is done first to see if there is a better target to biopsy. I communicated this to the NSGY team. They asked me to expedite the MRI and asked IR to put the patient on the schedule tomorrow. Discussed with IR APP, the Neuro IR attending will need to review the imaging tomorrow prior to scheduling the patient (NSGY aware).    Called MRI to try and expedite the scans. Currently has MRI T/L spine and MRI head ordered. Discussed the current situation as above with MRI. Right now would prioritize the spine imaging over the head imaging - they changed the order to stat and marked it as priority. I also asked them to add to the indication for the study that NSGY is concerned about osteo (scans were ordered initially as a part of the malignancy workup before NSGY noted their concerns for osteo). Appreciate the assistance from MRI.    Hopeful that the scans will get done this evening for the Neuro IR team to review in the morning. Will make the pt NPO at MN in anticipation of possibly biopsy tomorrow.     Patient seen with Dr. Fabian, please see attending note for today's full assessment and plan.     Napier Field, Shane Pearson  10/02/2023  4:22 PM

## 2023-10-02 NOTE — Progress Notes (Addendum)
 Nephrology Progress Note   LOS: 2 days     Interval History  No acute events overnight  HD yesterday 2L UF   R sided thoracentesis 7/22  Going for urgent IR biopsy of C7 vertebral body/disc space     Subjective   Patient seen & examined at bedside for extra HD needs. Chart reviewed for events, lab, and radiology data.   Denies SOB, CP, N/V/D. He is doing ok this morning.     Objective      Current Vitals Vitals Range (24 Hours)   BP 106/50 (BP Location: Right arm)   Pulse 80   Temp 36.8 C (98.2 F) (Temporal)   Resp 24   Ht 1.651 m (5' 5)   Wt 79.8 kg (176 lb)   SpO2 91%   BMI 29.29 kg/m  BP: (87-110)/(50-92)   Temp:  [35.9 C (96.6 F)-37.8 C (100 F)]   Temp src: Temporal (07/23 0804)  Heart Rate:  [80-93]   Resp:  [20-24]   SpO2:  [89 %-99 %]   Height:  [165.1 cm (5' 5)]   Weight:  [79.8 kg (176 lb)]      I/Os Weight   I/O last 3 completed shifts:  07/22 0700 - 07/23 0659  In: 1240 (15.5 mL/kg) [P.O.:840; I.V.:400 (0.2 mL/kg/hr)]  Out: 2629 (32.9 mL/kg) [Other:2629]  Net: -1389  Weight: 79.8 kg  Last 4 Weights    09/29/23 0031 09/29/23 0041 10/01/23 0810 10/01/23 2003   Weight: 80.1 kg (176 lb 9.4 oz) 80.1 kg (176 lb 9.4 oz) 79.9 kg (176 lb 2.4 oz) 79.8 kg (176 lb)        Physical Exam:  Constitutional:  General: Not in acute distress, laying in bed  Eyes: Anicteric sclerae  HENT:  Head: Normocephalic and atraumatic  Nose: Nose normal  CVS:   Rate and Rhythm: Normal rate and rhythm  Pulses: Normal pulses  Heart sounds: Normal heart sounds  Other: no peripheral edema bilaterally  Pulmonary:   Effort: No respiratory distress,  Breath sounds: CTAB anteriorly   GI: Firm, distended, no guarding or rebound, +BS  MSK: Warm and well perfused  Neuro:   General: No focal deficit noted  Mental status: Alert and oriented x3  Psych:   Mood and Affect: Mood normal.      Behavior: Behavior normal.   Access:HDTC, dressing CDI    Recent Labs   Lab 10/02/23  0611 10/01/23  0122 09/30/23  0625   Sodium 131* 137 135    Potassium 6.3* 5.5* 5.2*   Chloride 94* 90* 89*   CO2 21 26 29*   Anion Gap 16 21* 17*   UN 41* 60* 48*   Creatinine 8.73* 12.10* 10.50*   Glucose 75 82 84   Calcium  8.8 9.0 9.0   Albumin 3.2* 3.4* 3.4*   Phosphorus 4.6* 4.6* 4.6*   Magnesium  1.8 1.7 1.8     No results found for: BKVQL  No results found for: CMVQL  Aerobic bacterial pleural or thoracentesis fluid culture and stain   Date Value Ref Range Status   10/01/2023 Lab Cancel  Final     Bacterial Blood Culture   Date Value Ref Range Status   10/01/2023 .  Preliminary   10/01/2023 .  Preliminary        Recent Labs   Lab 10/02/23  0611 10/01/23  0122 09/30/23  0625   WBC 15.3* 15.6* 14.7*   Hemoglobin 9.7* 10.9* 10.8*   Hematocrit 34* 37  37   Platelets 451* 527* 497*         Component Value Date/Time    FER 650 (H) 09/29/2023 0327     No results for input(s): TACRR, SIR, CSAM in the last 168 hours.   No results for input(s): JOLINDA PLEVA, [place], [place], [place], [place], UOSMO in the last 168 hours.  No results for input(s): UAPP, UCOL, UAGLU, KETONESU, USG, UBLD, UAPH, UPRO, UNITR, ULEU, URBC, UWBC, UMUC, [place], [place], SPECIFICGRAV, BLOODUA, PHUA, PROTEIN, NITRITEUA, LEUKESTERASE, GLUCOSESIE, KETONESIEM, PUSG, BLOODSIEMEN, PUAPH, PROTEINUA, PUNIT, LEUKOCYTESIE in the last 8760 hours.           * Femur LEFT standard AP and Lateral  Result Date: 10/02/2023  Patchy mixed radiolucency and sclerosis of the proximal femur and patchy sclerosis of the ischium. No pathologic fracture seen. END OF IMPRESSION     * Femur RIGHT standard AP and Lateral  Result Date: 10/02/2023  Patchy decreased bone mineralization of the proximal femur and patchy sclerosis of the ischium. No pathologic fracture perceived. END OF IMPRESSION     CT abdomen and pelvis with IV contrast  Result Date: 10/01/2023  Nodal and extensive intramuscular lesions as described, likely metastases. Right adrenal lesion is  indeterminate but also suspicious for metastatic disease given context. No clear primary malignancy identified in the abdomen or pelvis. END OF IMPRESSION     Chest single frontal view  Result Date: 10/01/2023  Trace right effusion remaining. No evidence of pneumothorax. Small left effusion unchanged. END OF IMPRESSION     MR cervical spine without and with contrast  Result Date: 10/01/2023  Decreased sensitivity/specificity secondary to patient motion and poor signal on several sequences. Expansile enhancing lesion within the C7 vertebral body with soft tissue components extending into the prevertebral space and the left anterolateral epidural space. Additionally, there is suspected soft tissue component extending into the left C6-C7 and C7-T1 neuroforamen which causes severe left foraminal narrowing at C7-T1 and moderate to severe left foraminal narrowing at C6-C7. Associated trace fluid within the prevertebral space. Overall, findings are suspicious for osseous metastatic disease. Additional enhancing lesions within the left suboccipital musculature at the level of C4. Possible additional subcentimeter lesions within the left paraspinal musculature at the level of C3-C4 and the left posterior neck base. These are suspicious for additional metastatic lesions. Borderline enlarged right level 2 lymph node with slightly rounded morphology. Attention on follow-up imaging is recommended. Prominent STIR hyperintensity along the anteroinferior endplate of C4 with ill-defined enhancement, favoring degenerative changes. Attention on follow-up imaging is recommended. Partially visualized moderate to large pleural effusions. END OF IMPRESSION     CT chest without contrast  Result Date: 09/29/2023  Moderate right and small left layering pleural fluid, with small fluid tracking along the perifissural regions of the lingula and right lower lobe. This results in atelectasis of the right greater than left lower lobe. Multiple  enlarged mediastinal lymph nodes of indeterminate etiology. PET/CT could be considered if clinically appropriate. Follow-up chest CT after resolution of acute symptomology or 8 to 12 weeks is also advised. Scattered pulmonary nodules measuring up to 4 mm, again of indeterminate clinical significance. These can be reassessed for at the time of follow-up imaging per clinical guidance, or at the time of recommended follow-up chest imaging as above. Right chest wall subcutaneous soft tissue density nodules may represent enlarged lymph nodes. Incidental polycystic configuration of the bilateral kidneys. END OF IMPRESSION I have personally reviewed the images and the Resident's/Fellow's interpretation and agree with  or edited the findings.     *Chest standard frontal and lateral views  Result Date: 09/29/2023  Persistent small bilateral pleural effusions with presumed adjacent parenchymal atelectasis, slightly increased.. END OF IMPRESSION I have personally reviewed the images and the Resident's/Fellow's interpretation and agree with or edited the findings.     CT head without contrast  Result Date: 09/29/2023  No CT evidence of acute intracranial abnormality. Suspected odontogenic related sinus disease in setting of adjacent periapical lucencies of the maxillary molars. Recommend correlation with dental evaluation. END OF IMPRESSION        Current Medications  Scheduled Meds:   acetaminophen   1,000 mg Oral TID    magnesium  oxide  400 mg Oral Daily    atorvastatin   20 mg Oral Daily with dinner    B complex-vitamin C-folic acid   1 tablet Oral Daily    calcium -vitamin D   1 tablet Oral BID    cinacalcet   30 mg Oral Daily    fludrocortisone   0.1 mg Oral TID    fluticasone   1 spray Nasal Daily    midodrine   15 mg Oral TID    omeprazole   40 mg Oral QAM    Lidocaine   2 patch Transdermal Q24H    heparin  (porcine)  5,000 units Subcutaneous TID     Continuous Infusions:  PRN Meds:dextrose , oxyCODONE , heparin  (porcine), heparin   (porcine), sodium chloride , dextrose         Assessment  Shane Pearson is a 63 y.o. Male with history significant for ESRD (on HD TTS), hypotension, and lumbosacral spondylosis who presented to the ED for pain and difficulty walking. Workup concerning for malignancy, considering mediastinal lymphadenopathy. Nephrology consulted for dialysis needs.     Plan    ESRD 2/2 HTN on HD TTS at [facility]:  Dose adjust medications to CrCl <10 mL/s  Continue nephrovite    Will await for repeat potassium to result and decide whether Mr. Getter would benefit from additional HD. Do want to note that there are limited dialysis slots at this time.     Dialysis access:   HDTC    Hyperkalemia/hyponatremia   Most recent potassium level 6.1 s/p medical temporization with dextrose  + insulin , calcium  gluconate.Agree with checking K 1 hour post temporization. Will check pre-post-HD BUN to assess clearance.   - Continue low K diet  - Would start Velt[place]  16.8g daily   Hyponatremia, sodium 131. Monitor.     CKD-BMD:  Calcium  Level: 8.8  PO4 Level: 4.6, slightly above normal but acceptable   - Calcimimetic: sensipar  30 mg daily     Acid-Base Disorders:   Controlled with HD   Will keep within desired goal range (serum bicarb 22-25) with RRT against 35 bicarb    Hemodynamics:   SBP range: 80-100's  Continue antihypertensive meds: midodrine  15 mg TID with meals   Will remove fluid with scheduled HD treatments    Anemia:   Hgb as above  No ESA indicated at this time  Hemoglobin goal (chronically) 10-11.5 gm/dl  Transfusion of PRBC as needed per primary team    Remainder of management per primary team.  Thank you for allowing us  to participate in the care of this patient.    Ronal Critchley, NP  Nephrology Division  [facility]  Pager [phone]  10/02/2023 8:57 AM

## 2023-10-02 NOTE — Consults (Signed)
 Medical Nutrition Therapy Assessment  Reason for consult: Triggered by nursing risk screen    Patient Summary: 63 y.o. male with hx of  ESRD (on HD TTS), hypotension, and lumbosacral spondylosis. Presented 09/28/2023 with  chest pain and left shoulder pain. Initial work up was notable for mild leukocytosis, chronic anemia, thrombocytosis, elevated NT-pro BNP, elevated but adynamic troponin, elevated AST and Alk Phos, and ESRD. Imaging with chest x-rays revealed right-sided opacity with concern for atelectasis and minimal right pleural effusion.     Anthropometrics    Height: 165.1 cm (5' 5)   Weight: 79.8 kg (176 lb)   BMI (Calculated): 29.3 kg/m2   IBW: 61.8 kg +/-10%   UBW:    Weight Hx: based on 7/14 weight, weight loss over the past few months though none significant. Accuracy of weight changes may be altered by fluid status pre/post-HD at time weights were obtained. Updated actual weight needed for accurate assessment.   WEIGHTS Weight (metric) Weight (standard) Weight Method   10/01/2023 79.833 kg  79.9 kg 176 lbs  176 lbs 2 oz --   09/29/2023 80.1 kg  80.1 kg 176 lbs 9 oz  176 lbs 9 oz Stated   09/28/2023 80.1 kg 176.2 lbs [facility] post-HD   09/23/2023 82.6 kg  182 lbs  Office visit    08/21/2023 84.4 kg  186 lbs  Office visit    08/16/2023 83.9 kg  185 lbs  Office visit    04/03/2023 87.5 kg 193 lbs  Office visit    10/03/2022 90.7 kg  200 lbs  Office visit    08/09/2021 86.183 kg 190 lbs Stated      Nutrition Focused Physical Exam assessed 7/23   Body Fat Stores: Adequate   Lean Body Mass: Adequate   Inpatient Orders   Diet: Sodium restriction (2-3 gm), Potassium restricted (2-3 gm), and Phosphorous restricted (1500 mg)  Food Allergies: coconut (all sources), clarified with pt      .   Access: PIV and hemodialysis cath  Reviewed labs, medications and IO's.   Labs: sodium 131, K 6.3, chloride 94, UN 41, creatinine 8.73, GFR 6, alk phos 210, CRP 338. BG 75-84  Meds: Nephrovite, calcium  gluconate, calcium -vitamin D ,  fludrocortisone , insulin  regimen, Mg oxide, omeprazole , bowel regimen.     Last BM 7/22     Edema: none     Abdomen: +BS   Skin: Intact        Nutrition and Relevant Social Hx: married, [place] in [place]. Per H&P, patient reported weight loss 11 lbs over the past few months along with poor appetite and night sweats. Bowel movements have fluctuated between loose and constipated over the past several weeks. See below for full nutrition assessment.      Nutrition Impact Symptoms:   []  Nausea / Vomiting []  Abdominal Pain or Cramping    [x]  Reduced Appetite []  Early Satiety   [x]  Diarrhea [x]  Constipation   []  Bloating [x]  Chewing Difficulty   []  Poor dentition  []  Smell aversions   []  Dentures  []  Taste changes   []  Post prandial pain []  Painful swallowing   []  Unable to assess      Estimated Needs (based on 80 kg)   2000-2400 kcal/day (25-30 kcal/kg)   105-145 g protein/day (1.3-1.8 g/kg)    [phone] mL +UOP or per Nephrology     Assessment:   Inadequate oral intake related to current medical condition as evidenced by poor intake, change in appetite, early satiety, or skipped  meals     Patient reported decreased appetite over the past 8-9 weeks d/t feeling very fatigued and tired of chewing. Added he eats ice during HD treatments and stopped doing that before admission. No other complaints other than dry mouth which also contributed to difficulty chewing. Patient works full-time job and a part-time job plus has HD 3 days per week. He has 2 meals per day, often consisting of fast food; he has McDonald's breakfast daily (egg McMuffin and hasbrowns or sausage McMuffin), and most nights of the week goes out to eat or has fast food (e.g. Popeye's, fast food, fish fry). Snacks on pretzels and at HD has Oreos. Over the past 8-9 weeks, patient said he has been eating 1/2 portions of what he usually eats. Has lost 14 lbs as a result.     Patient meets criteria for moderate refeeding risk based on: <75% EEE >1 month and  minimally low levels of phosphorous, potassium, and/or magnesium  necessitating minimal or single dose repletion    Malnutrition Status: Unable to determine at this time due to lack of current actual weight.     Nutrition Intervention   Continue Sodium restriction (2-3 gm), Potassium restricted (2-3 gm), and Phosphorous restricted (1500 mg) diet   RD Ordered: Nepro (420 calories, 19 g protein each) daily   Cannot rule out risk for refeeding syndrome. Monitor K+, PO4, Mg++ and replete as indicated.   RD provided nutrition education: Benefit of oral nutrition supplement and Small frequent meals and snacks   Please obtain updated actual weight    Nutrition Monitoring Recommendations   Monitoring: weekly weight, % meals consumed, IO's, labs as ordered by team appropriate      Following per high nutrition risk protocol.  Tinnie Curet, MS RD  Assessment Type: Initial

## 2023-10-02 NOTE — Progress Notes (Signed)
Patient is scheduled for hemodialysis 1st shift tomorrow morning. Transporters will arrive for patient between 0700-0800. Please obtain an accurate weight prior to pick up, ensure the patient is ready for transport, and administer any pre-dialysis medications. Also, please make sure patient is on HoverMatt prior to coming to Dialysis. If patient is to receive MIDODRINE please ensure dose has been administered prior to the patient leaving for HD. Please consult pharmacy if you are unsure of medications that should be held prior to HD.     Please weigh patient daily for accurate fluid monitoring.    Thank you.    Royal Vandevoort, RN

## 2023-10-02 NOTE — Progress Notes (Signed)
 Treatment        Discharge Recommendations:  Discharge to Planned Living Arrangement: Patient's ADL skills are currently a barrier to discharge. Further OT visits are needed. Recommend Home OT.     OT need to see patient prior to discharge: Yes    Equipment Recommendations: TBD    OT Hospital Stay Recommendations: Encourage participation with ADLs, Toilet transfer status - CGA with RW, and OOB for meals        OT Referral Recommendations: Physical Therapy    SUBJECTIVE and OBJECTIVE:  Precautions:   Fall Precautions: General fall precautions  L/D/A: IV lines and Monitors  Activity order: Activity as tolerated  Patient wearing mask: No  Writer wearing PPE including: Mask and Gloves  Vitals: Vital signs stable  Additional Observations: Rec'd at EOB, reports no appetite for breakfast. Cooperative and eager to participate        Pain:   Reports some pain in shoulders  Description: aching  Intervention: refer to RN for pain mgmt, rest breaks provided, and repositioned    TREATMENT  Cognition: No deficit noted    Vision: Impaired and diplopia in lower fields. Worked on eye teaming exercises at boundary where vision becomes double.  Implemented blur patch/tape on right side of glasses on lower half of lens. This approach successfully resolved diplopia for near/lower objects by blocking right eye and allowing left eye to function. For distance, both eyes function and patient without report of diplopia. Encouraged eye excercises 2-3 times per day.    Perception: Intact    UE Function:   ROM:   RUE: Proximal AROM limited at shoulders, distally intact.      LUE: Prox AROM limited at shoulder, distally intact      Strength:   RUE: Should 2/5, distally 4/5     LUE: Should 2/5, distally 4/5      Sensation: Intact   Coordination: Intact    Functional mobility:  Static sitting: Good, unsupported  Dynamic sitting: Good, unsupported  Static standing: Good, supported  Dynamic standing: Fair, supported           Sit to stand:  modified independence, Rolling walker  Stand to sit: modified independence, Rolling walker  Toilet transfer: modified independence, Rolling walker   Functional mobility: contact guard assist, Rolling walker    ADL Assessment: ADLs completed/ simulated with level of assist as dictated below:   Feeding: independent   Grooming: SBA standing at sinkside with RW  UB Dressing: minimal assist button up shirt around back  LB Dressing: contact guard assist   Toileting: SBA       ASSESSMENT and PLAN    Assessment: Patient making progress, continue with POC    Frequency: 3-5x/week    Patient Education: role of OT, plan of care, discharge recommendations, progress, OT goals, HEP discussed and/or provided, and blur tape and eye exercises   Learners: patient   Readiness: acceptance   Method: explanation and demonstration   Response: verbalizes understanding and demonstrates understanding    Multidisciplinary Communication:  patient, RN        OCCUPATIONAL THERAPY PROVIDER   Electronically Signed By:   Ray Rubin, OT    Please contact the OT via Secure Chat to: [facility] Occupational [facility] First [facility] with any questions/concerns and/or update requests.    Timed Calculations:  Timed Codes:  45  Untimed Codes: 0  Unbilled Time: 0  Total Time:  45

## 2023-10-02 NOTE — Plan of Care (Signed)
 HMD APP Brief Note    Requested by neurosurgery to have radiology comment on whether the lesion in spine appears infectious or not. I spoke to radiologist. Given the lesions in c-spine extend from the vertebral body into the surrounding soft tissues they are more consistent with metastasis and not osteomyelitis. No identified fluid collections.     Will need IR consult for biopsy. Will await final imaging (MRI T/L spine) to be completed to assess safest target for biopsy (ie lymph node vs spinal lesion vs other) prior to placing IR consult order.    Brad JONELLE Broody, PA

## 2023-10-02 NOTE — Progress Notes (Signed)
 Report Given To  Magluyan, RN      Descriptive Sentence / Reason for Admission   Team:    Presented with:intermittent (L) shoulder pain x 2 weeks. Inability to ambulate x 1 week r/t (R) leg pain. + muscle aches + fatigue + weight loss + night sweats + poor PO intake  Relevant PMHx:ESRD HD (T,Th,S) Osteoarthritis, Hypotension  Ik:[id number]           Active Issues / Relevant Events   Shift Event (last 24 hours):  7/21 N: admission completed, complaint of R arm pain, MRI done this shift, 1 BM using shower care to transfer did well, labs completed. Pt slept well. Encouraged po intake and able to make needs known.   7/22D: HD today- 2L removed, bedside thorancentesis done, specimen sent from fluid. Cervical MRI- showed metastatic lesions--ordered:CT/MR/XRAY: CT- scan pending, awaiting transport- provider aware. MR of head/spine to be done, Xray to Bilat femur ordered. C/o pain right side of abd/chest- declined prn oxy this shift.  7/23 D: Labs sent, soft BP throughout shift provider aware. Worked with PT, OOB to chair, potassium trended down currently 5.0. C/o blurred vision to provider, NPO at midnight possible biopsy tomorrow. More PT work up for discharge.     Precautions:        AOX:X4  Mobility: Cane  Pills: Whole  Meals: Independent  Continent: (yes/no)  IV:     (drsg due__)   (Line cap change due__)  IVF/gtts:     Drains/tubes/O2/Bipap/CPAP:     Patient Behaviors:Calm           To Do List  VS:Q4  Tele: No   Sticker Change Daily []   Wound Care:  Labs/Tests: ESRD, dialysis T/Th/Sat (received dialysis 07/19)   BGs:                                                                             Daily Weights: no                                            Daily Bath (soap & water /CHG): ___    Restraints: (no)  Violent or nonviolent  When restraints order renewal needed:___         Wandalee CHRISTELLA Free, LPN

## 2023-10-02 NOTE — Progress Notes (Cosign Needed Addendum)
 Neurosurgery Progress Note    24hr events & Subjective:   Hospital Day: 2  POD: * No surgery found *   Significant events:    Crp 300, esr 121  Blood cultures NGTD    Rads thinks findings more consistent with malignancy    Objective:   Examination:   GEN: No apparent distress  Mental Status: Awake and oriented x 3   Cranial Nerves: PERRL, face symmetric, tongue midline  Motor Exam: AG x4 to command; B/l delts 4/5 pain limited  L bicep 4/5 L tricep 4/5, otherwise 5/5   Sensory Exam: Sensation intact to light touch throughout    Assessment/ Plan:     Assessment: 63 y.o. 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  Workup ongoing for possible malignant versus infectious process.    Plan:   Trend ESR/CRP  Urgent IR biopsy of C7 vertebral body/disc space Stat  Blood cultures pending- NGTD      Please page the on call Neurosurgery 1st call resident for any questions regarding this patient's care.  Signed by DENNISE SLEDGE, MD on 10/02/2023 at 5:27 AM    Note created using Dragon Voice-to-Text software. Typographical and grammatical errors may exist despite proofreading

## 2023-10-02 NOTE — Progress Notes (Addendum)
 Hospital Medicine Service Attending Progress Note    Significant Events/Subjective:   Yesterday consulted heme-onc for concern of new metastatic disease of unclear primary.  Bilateral femur x-rays were recommended and ordered.  Yesterday neurosurgery was consulted for C7 lesion with extension and foraminal stenosis, with concern for cervical osteo.  Aspirin  was held overnight.  Overnight team spoke with radiologist who noted from their perspective, more likely malignancy than infection.  Patient has successful flora, with cytology pending.  CT A/P obtained and results below.    Currently, sitting in chair resting. Evaluated on rounds with HMD APP.  He has been up out of bed, walked to the unit.  Had half of his breakfast this morning.  Has not had a bowel movement and is unclear of his last 1.  Slept okay.  He is very concerned with his vision and would like to speak with the ophthalmologist.  Discussed there is a biopsy planned for 7/25.  Denies chest pain, shortness of breath, nausea/vomiting/abdominal pain currently.  Continues to have shoulder pain that is unrelieved with Tylenol , has not tried oxycodone .  Discussed benefits of trying an opiate especially if it is bone pain from meds which are very painful.  Patient agreed to try.    Objective:     Physical Exam  Temp:  [35.9 C (96.6 F)-37.8 C (100 F)] 36.8 C (98.2 F)  Heart Rate:  [80-93] 80  Resp:  [20-24] 24  BP: (87-110)/(50-92) 106/50  79.8 kg (176 lb)    Constitutional: Sitting in the chair, talkative, no acute distress, alert  Neck: No cervical lymphadenopathy, supple  Eyes: Cataracts, nonicteric sclera, no drainage  ENT: Dry oral mucosa, upper dentures, no obvious oral cavity abscesses, nares patent  CV: Regular rate, regular rhythm, no murmur/rub/gallops, no pedal edema  Resp: Normal work of breathing on room air, improved aeration aeration bilaterally without adventitious lung sounds  GI: Abdomen firm, nontender, somewhat distended, no  organomegaly  GU: No Foley  MSK: Able to move all extremities spontaneously, normal bulk  Skin: Warm, dry, intact  Neuro: AO x 4  Psych: Good insight, fair mood, reasonable    Recent Lab Studies:  Personally reviewed and notable for:  Recent Labs   Lab 10/02/23  0611 10/01/23  0122 09/30/23  0625   WBC 15.3* 15.6* 14.7*   Hemoglobin 9.7* 10.9* 10.8*   Hematocrit 34* 37 37   Platelets 451* 527* 497*      Recent Labs   Lab 10/02/23  0611 10/01/23  0122 09/30/23  0625   Sodium 131* 137 135   Potassium 6.3* 5.5* 5.2*   Chloride 94* 90* 89*   CO2 21 26 29*   UN 41* 60* 48*   Creatinine 8.73* 12.10* 10.50*   Glucose 75 82 84   Calcium  8.8 9.0 9.0   Albumin 3.2* 3.4* 3.4*   Phosphorus 4.6* 4.6* 4.6*     Recent Labs   Lab 09/30/23  1943   INR 1.4*   Protime 15.7*       Recent Imaging Studies:  * Femur LEFT standard AP and Lateral  Result Date: 10/02/2023  Patchy mixed radiolucency and sclerosis of the proximal femur and patchy sclerosis of the ischium. No pathologic fracture seen. END OF IMPRESSION     * Femur RIGHT standard AP and Lateral  Result Date: 10/02/2023  Patchy decreased bone mineralization of the proximal femur and patchy sclerosis of the ischium. No pathologic fracture perceived. END OF IMPRESSION  CT abdomen and pelvis with IV contrast  Result Date: 10/01/2023  Nodal and extensive intramuscular lesions as described, likely metastases. Right adrenal lesion is indeterminate but also suspicious for metastatic disease given context. No clear primary malignancy identified in the abdomen or pelvis. END OF IMPRESSION     Chest single frontal view  Result Date: 10/01/2023  Trace right effusion remaining. No evidence of pneumothorax. Small left effusion unchanged. END OF IMPRESSION     MR cervical spine without and with contrast  Result Date: 10/01/2023  Decreased sensitivity/specificity secondary to patient motion and poor signal on several sequences. Expansile enhancing lesion within the C7 vertebral body with soft  tissue components extending into the prevertebral space and the left anterolateral epidural space. Additionally, there is suspected soft tissue component extending into the left C6-C7 and C7-T1 neuroforamen which causes severe left foraminal narrowing at C7-T1 and moderate to severe left foraminal narrowing at C6-C7. Associated trace fluid within the prevertebral space. Overall, findings are suspicious for osseous metastatic disease. Additional enhancing lesions within the left suboccipital musculature at the level of C4. Possible additional subcentimeter lesions within the left paraspinal musculature at the level of C3-C4 and the left posterior neck base. These are suspicious for additional metastatic lesions. Borderline enlarged right level 2 lymph node with slightly rounded morphology. Attention on follow-up imaging is recommended. Prominent STIR hyperintensity along the anteroinferior endplate of C4 with ill-defined enhancement, favoring degenerative changes. Attention on follow-up imaging is recommended. Partially visualized moderate to large pleural effusions. END OF IMPRESSION     CT chest without contrast  Result Date: 09/29/2023  Moderate right and small left layering pleural fluid, with small fluid tracking along the perifissural regions of the lingula and right lower lobe. This results in atelectasis of the right greater than left lower lobe. Multiple enlarged mediastinal lymph nodes of indeterminate etiology. PET/CT could be considered if clinically appropriate. Follow-up chest CT after resolution of acute symptomology or 8 to 12 weeks is also advised. Scattered pulmonary nodules measuring up to 4 mm, again of indeterminate clinical significance. These can be reassessed for at the time of follow-up imaging per clinical guidance, or at the time of recommended follow-up chest imaging as above. Right chest wall subcutaneous soft tissue density nodules may represent enlarged lymph nodes. Incidental polycystic  configuration of the bilateral kidneys. END OF IMPRESSION I have personally reviewed the images and the Resident's/Fellow's interpretation and agree with or edited the findings.     *Chest standard frontal and lateral views  Result Date: 09/29/2023  Persistent small bilateral pleural effusions with presumed adjacent parenchymal atelectasis, slightly increased.. END OF IMPRESSION I have personally reviewed the images and the Resident's/Fellow's interpretation and agree with or edited the findings.     CT head without contrast  Result Date: 09/29/2023  No CT evidence of acute intracranial abnormality. Suspected odontogenic related sinus disease in setting of adjacent periapical lucencies of the maxillary molars. Recommend correlation with dental evaluation. END OF IMPRESSION       Assessment:     Shane Pearson is a  63 y.o. male with ESRD (on HD TTS), hypotension, and lumbosacral spondylosis who presented to the ED for chest pain and left shoulder pain. Initial work up was notable for mild leukocytosis, chronic anemia, thrombocytosis, elevated NT-pro BNP, elevated but adynamic troponin, elevated AST and Alk Phos, and ESRD.  Initial chest x-ray demonstrating right sided opacity, concern for atelectasis and minimal right pleural effusion.  He was admitted for constitutional symptoms and workup  concerning for occult infectious process, rheumatologic disorder or malignancy.  Further workup since admission is highly suggestible for metastatic disease of unclear primary with multiple cervical spine lesions with extension, multiple nodal/intramuscular lesions on CT A/P, b/l exudative pleural effusions s/p tap (pending cytology).  Currently, patient is clinically stable, pending further diagnostic evaluation and will need IR biopsy of safest lesion, pending completed spine imaging.    Plans:   Mediastinal lymphadenopathy, multiple osseous metastatic lesions throughout cervical spine, constitutional symptoms  -Labs ordered on  admission: ESR 121, CRP 338, PSA 0.22, AFP negative, CEA 5.2, CA 19-9 19, ANA 1: 80, CK 67, LDH 798. Added on uric acid for TLS evaluation  -Concerning for underlying malignancy.  This was discussed with patient.  Prior to completion of this note, MRI results returned indicating multiple osseous metastatic lesions throughout cervical spine.  This was discussed with patient who is aware and understands high concern for malignancy, but cannot confirm without biopsy.  No obvious primary at the moment.   - Follow-up thoracic and lumbar MRI for further disease  -Will need biopsy with IR for pathology; discussed with neuro radiology and neurosurgery today to determine best course of action ---neuroradiology believes this is metastatic in nature, would prefer to have MRI thoracic/lumbar completed prior to initiating biopsy to confirm best spot for biopsy. Plan for MRIs later this evening and IR biopsy 7/24, NPO 7/24 0001  - Heme/onc following, appreciate ongoing recommendations.  For suspected malignancy of unclear primary; if PET scan inpatient possible?  -Neurosurgery following, appreciate ongoing recommendations.    -Pain control: 1000 mg Tylenol  scheduled 3 times daily, breakthrough oral oxycodone  as needed for bone pain.  Can progress to IV dilaudid  as needed (ESRD)    Large right exudative pleural effusion s/p thoracentesis  Moderate left pleural effusion  -Appears layered on CT  -No growth on Gram stain  -Pending cytology, culture    Vertical diplopia  -Ophthalmology following, planning for biopsy 7/25 for GCA; reached out to ophthalmology team 7/23 as patient would like to rediscuss options for his vision, as this is the most distressing thing to him at the moment  [facility] if there could be infiltration of cranial nerves and if additional brain imaging with MRI would be warranted considering acute onset and setting of potential malignancy.  No obvious tumor seen on CT head  -ESR/CRP trend every 3  days    Hyperkaleima s/p temporizer, resolved  ESRD with HD (T/TH/Sat)  - Nephrology following closely  - BMP in AM     Subacute diarrhea  -Stool studies negative     Additional concerns-  -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 If for primary prevention, will need to discontinue permanently. Will assess further with patient - per prior doc, does not appear to have hx of CVA/MI.  Currently being held in preparation for IR biopsy  Allergic rhinitis - Continue flonase     DVT PPx: Heparin  subcu    Emergency Contact: [name] (spouse/HCP) [phone]   Code Status: Full code    Discharge Planning:   MRDD: 10/05/2023  Discharge Criteria/Barriers to Discharge: Further workup of potential malignancy  including biopsy/outpatient connection  PT and/or OT Recommendations/Discharge to: Will hold off until closer to discharge  Appointments Needed with: PCP, likely heme-onc, neurosurg, ophthalmology    Case discussed with HMD APP, neurosurgery, heme-onc, interdisciplinary team    I personally spent 50 minutes on the calendar day of the encounter, including pre and post visit work.      Christohper Dube, MD on 10/02/2023 at 8:52 AM

## 2023-10-02 NOTE — Progress Notes (Signed)
 Physical Therapy Treatment Note    Therapy Recommendations:  Discharge Recommendations:  Discharge to Planned Living Arrangement:    Patient's mobility is currently a barrier to discharge. Further PT visits are needed.  Recommendations:   PT Discharge Equipment Recommended: (P) Rolling walker,    PT Positioning Recommendations: (P) OOBTC daily  PT Mobility Recommendations: (P) Modified Independent with RW  PT Referral Recommendations: (P) SW, Home care       10/02/23 1125   PT Tracking   PT TRACKING PT Assigned   Visit Number   Visit Number [facility]) / Treatment Day [facility]) 2   Visit Details [facility])   Visit Type [facility]) Follow Up-General   Precautions/Observations   Precautions used Yes   Fall Precautions General falls precautions   Activity Orders Present Yes   LDA Observation Monitors;Dialysis catheter   Vital Signs Response with Therapy NAD   Was patient wearing a mask? No   PPE worn by Clinical research associate [facility]   Patient Subjective Got alot of things going on today.   Additional Observations Approached seated in recliner left in supine with all needs in reach.   Other Per RN okay to mobilize, updated post PT.   Current Pain Assessment   Pain Assessment / Reassessment Assessment   Pain Scale 0-10 (Numeric Scale for Pain Intensity)   0-10  Pain Scale 5   Pain Location/Orientation Other (Comment)  (Right side)   Pain Descriptors Headache   No Intervention/MAR Intervention(s) No Intervention required   Pain Comments Please refer to NSG for pain management.   Vision    Current Vision Visual deficits   Additional Comments States he has bluriness hopeful Opthamology can help.   Communication   Insurance risk surveyor Style Verbal   Cognition   Cognition Tested   Arousal/Alertness Appropriate responses to stimuli   Orientation A&Ox4   Ability to Follow Instructions Follows all commands and directions without difficulty   Type of Instructions Given Verbal   Additional Comments Pleasant and cooperative.    Bed Mobility   Bed mobility Tested   Supine to Sit Modified independent (device)   Sit to Supine Modified independent (device)   Transfers   Transfers Tested   Sit to Stand Modified independent (device)   Stand to sit Modified independent (device)   Transfer Assistive Device rolling walker   Additional comments Demonstrates safe sequencing, hand placement, and eccentric control when returning to sitting.   Mobility   Mobility: Gait/Stairs Tested   Gait Pattern Decreased cadence;Trunk flexed   Ambulation Assist Modified independent (device)   Ambulation Distance (Feet) 150' x 2 , 30' x 1   Ambulation Assistive Device rolling walker   Stairs Assistance Not tested   Additional comments Deferred stairs due to fatigue with ambulation, agreeable to attempt next session.   Training and Education   Patient POC, DC planning.   Balance   Balance Tested   Sitting - Static Independent   Sitting - Dynamic Independent   Standing - Static Independent;Supported   Standing - Dynamic Independent;Supported   Additional comments BUE support on RW   Functional Outcome Measures   Functional Outcome Measures Yes   PT AM-PAC Mobility   Turning over in bed? 4   Moving from lying on back to sitting on the side of the bed? 4   Moving to and from a bed to a chair? 4   Sitting down on and standing up from a chair with arms? 4   Need to walk  in hospital room? 4   Climbing 3 - 5 steps with a railing? 3  (Clinical judgement.)   Total Raw Score 23   AM-PAC T-Scale Score 50.88   Assessment   Brief Assessment Remains appropriate for skilled therapy   Problem List Impaired endurance;Impaired stair navigation;Pain contributing to impairment   Patient / Family Goal To get better.   Overall Assessment Patient demonstrating improvment with all aspects of mobility cmpared to initial evaluation. Anticipate with 1 addtional PT session will clear mobility wise.   Plan/Recommendation   PT Treatment Interventions Assess functional mobility;Stair  training;Pt/Family education;Home exercise program instruction;D/C planning;Will work to minimize pain while promoting mobility whenever possible   PT Frequency 3-5 x/wk   PT Positioning Recommendations OOBTC daily   PT Mobility Recommendations Modified Independent with RW   PT Referral Recommendations SW;Home care   PT Discharge Recommendations Anticipate return to prior living arrangement;Intermittent supervision/assist   PT Discharge Equipment Recommended Rolling walker   Transportation Recommendations Any   PT Assessment/Recommendations Reviewed With: Nursing;Patient   Next PT Visit Stairs   PT needs to see patient prior to DC  Yes   Time Calculation   Total Time Therapeutic Activities (minutes) 12   Total Time Gait Training (minutes) 18   Total Time Therapeutic Exercises (minutes) 0   Total Time Neuromuscular Re-education (minutes) 0   Total Time Group Therapy (minutes) 0   PT Timed Codes 30   PT Untimed Codes 0   PT Unbilled Time 5   PT Total Treatment 30   Plan and Onset date   Plan of Care Date 09/30/23   Onset Date 09/28/23   Treatment Start Date 09/30/23   Shane Pearson , PTA  (Please contact Physical Therapist on the treatment team via secure chat or via the secure group chat for your unit Physical Therapist with any questions. On weekends / holidays , please utilize the secure group chat , [facility] Physical Therapy 1st call, to contact PT. )

## 2023-10-03 ENCOUNTER — Ambulatory Visit: Admitting: Radiation Oncology

## 2023-10-03 ENCOUNTER — Inpatient Hospital Stay

## 2023-10-03 DIAGNOSIS — M898X8 Other specified disorders of bone, other site: Secondary | ICD-10-CM

## 2023-10-03 DIAGNOSIS — M799 Soft tissue disorder, unspecified: Secondary | ICD-10-CM

## 2023-10-03 LAB — EKG 12-LEAD
P: 84 deg
PR: 147 ms
QRS: 12 deg
QRSD: 96 ms
QT: 402 ms
QTc: 516 ms
Rate: 99 {beats}/min
T: -66 deg

## 2023-10-03 LAB — BASIC METABOLIC PANEL
Anion Gap: 17 — ABNORMAL HIGH (ref 7–16)
CO2: 21 mmol/L (ref 20–28)
Calcium: 8.9 mg/dL (ref 8.6–10.2)
Chloride: 94 mmol/L — ABNORMAL LOW (ref 96–108)
Creatinine: 10.5 mg/dL — ABNORMAL HIGH (ref 0.67–1.17)
Glucose: 77 mg/dL (ref 60–99)
Lab: 56 mg/dL — ABNORMAL HIGH (ref 6–20)
Potassium: 5.2 mmol/L — ABNORMAL HIGH (ref 3.3–5.1)
Sodium: 132 mmol/L — ABNORMAL LOW (ref 133–145)
eGFR BY CREAT: 5 — AB

## 2023-10-03 LAB — CBC AND DIFFERENTIAL
Baso # K/uL: 0.1 THOU/uL (ref 0.0–0.2)
Eos # K/uL: 0.5 THOU/uL (ref 0.0–0.5)
Hematocrit: 31 % — ABNORMAL LOW (ref 37–52)
Hemoglobin: 9.4 g/dL — ABNORMAL LOW (ref 12.0–17.0)
IMM Granulocytes #: 0.2 THOU/uL — ABNORMAL HIGH
IMM Granulocytes: 1.5 %
Lymph # K/uL: 1.3 THOU/uL (ref 1.0–5.0)
MCV: 82 fL (ref 75–100)
Mono # K/uL: 1.1 THOU/uL — ABNORMAL HIGH (ref 0.1–1.0)
Neut # K/uL: 8.5 THOU/uL — ABNORMAL HIGH (ref 1.5–6.5)
Nucl RBC # K/uL: 0 THOU/uL (ref 0.0–0.1)
Nucl RBC %: 0 /100{WBCs} (ref 0.0–0.2)
Platelets: 481 THOU/uL — ABNORMAL HIGH (ref 150–450)
RBC: 3.8 MIL/uL — ABNORMAL LOW (ref 4.0–6.0)
RDW: 19.4 % — ABNORMAL HIGH (ref 0.0–15.0)
Seg Neut %: 72.9 %
WBC: 11.6 THOU/uL — ABNORMAL HIGH (ref 3.5–11.0)

## 2023-10-03 LAB — BODY FLUID CELL COUNT
Nucl Cell,FL: 353 /uL
RBC,FL: 5380 /uL

## 2023-10-03 LAB — HEPATIC FUNCTION PANEL
ALT: 7 U/L (ref 0–50)
AST: 33 U/L (ref 0–50)
Albumin: 3.1 g/dL — ABNORMAL LOW (ref 3.5–5.2)
Alk Phos: 187 U/L — ABNORMAL HIGH (ref 40–130)
Bilirubin,Direct: <0.2 mg/dL (ref 0.0–0.3)
Bilirubin,Total: 0.2 mg/dL (ref 0.0–1.2)
Total Protein: 5.9 g/dL — ABNORMAL LOW (ref 6.3–7.7)

## 2023-10-03 LAB — UREA NITROGEN, POST DIALYSIS: UN post Dialysis: 21 mg/dL — ABNORMAL HIGH (ref 6–20)

## 2023-10-03 LAB — MAGNESIUM: Magnesium: 1.9 mg/dL (ref 1.6–2.5)

## 2023-10-03 LAB — UNABLE TO PERFORM ADD-ON TESTING 1

## 2023-10-03 LAB — CRP: CRP: 295 mg/L — ABNORMAL HIGH (ref 0–8)

## 2023-10-03 LAB — PATH REVIEW,FL

## 2023-10-03 LAB — SEDIMENTATION RATE, AUTOMATED: Sedimentation Rate: 124 mm/h — ABNORMAL HIGH (ref 0–20)

## 2023-10-03 LAB — PHOSPHORUS: Phosphorus: 3.8 mg/dL (ref 2.7–4.5)

## 2023-10-03 MED ORDER — LIDOCAINE HCL 1 % IJ SOLN *I*
INTRAMUSCULAR | Status: AC
Start: 2023-10-03 — End: 2023-10-03
  Filled 2023-10-03: qty 20

## 2023-10-03 MED ORDER — CEFEPIME-DEXTROSE 1 GM/58ML IV SOLR *I*
1000.0000 mg | INTRAVENOUS | Status: DC
Start: 2023-10-03 — End: 2023-10-15
  Administered 2023-10-03 – 2023-10-08 (×6): 1000 mg via INTRAVENOUS
  Filled 2023-10-03: qty 1
  Filled 2023-10-03: qty 58
  Filled 2023-10-03 (×4): qty 1
  Filled 2023-10-03: qty 58

## 2023-10-03 MED ORDER — VANCOMYCIN IV - PHARMACIST TO DOSE PLACEHOLDER *I*
Status: DC
Start: 2023-10-03 — End: 2023-10-09

## 2023-10-03 MED ORDER — MIDAZOLAM HCL 1 MG/ML IJ SOLN *I* WRAPPED
Freq: Once | INTRAMUSCULAR | Status: AC | PRN
Start: 2023-10-03 — End: 2023-10-03
  Administered 2023-10-03: 1 mg via INTRAVENOUS

## 2023-10-03 MED ORDER — METRONIDAZOLE 500 MG PO TABS *I*
500.0000 mg | ORAL_TABLET | Freq: Three times a day (TID) | ORAL | Status: DC
Start: 2023-10-03 — End: 2023-10-09
  Administered 2023-10-03 – 2023-10-09 (×18): 500 mg via ORAL
  Filled 2023-10-03 (×19): qty 1

## 2023-10-03 MED ORDER — MIDAZOLAM HCL 1 MG/ML IJ SOLN *I* WRAPPED
INTRAMUSCULAR | Status: AC
Start: 2023-10-03 — End: 2023-10-03
  Filled 2023-10-03: qty 2

## 2023-10-03 MED ORDER — GADOPICLENOL 0.5 MMOL/ML (VUEWAY) IV SOLN *I*
0.1000 mL/kg | Freq: Once | INTRAVENOUS | Status: AC
Start: 2023-10-03 — End: 2023-10-03
  Administered 2023-10-03: 7.98 mL via INTRAVENOUS

## 2023-10-03 MED ORDER — VANCOMYCIN HCL IN NACL 1250 MG/275 ML IV SOLN *I*
1250.0000 mg | Freq: Once | INTRAVENOUS | Status: AC
Start: 2023-10-03 — End: 2023-10-03
  Administered 2023-10-03: 1250 mg via INTRAVENOUS
  Filled 2023-10-03: qty 1250

## 2023-10-03 NOTE — Procedures (Signed)
 Procedure Report           Time out documentation completed prior to procedure:  Yes    Indications/Pre-Procedure diagnosis:  Osseous lesions    Procedure performed:  CT guided bone biopsy of L5 vertebral body lesion    Guide Wire(s) removed and inspected for integrity:N/A    Findings/Procedure Summary (detailed report located in the Image tab): Biopsy of L5 vertebral body lesion.    Complications:  None    Condition:  good    EBL: < 5 mL    Specimens:  yes    Operators: Dr. Alm Parent and Dr. Katrinka Derry    Disposition:  Recovery    Post-Procedure Diagnosis: Same    Katrinka Derry, MD  10/03/2023  3:18 PM

## 2023-10-03 NOTE — Progress Notes (Signed)
 Report Given   Interventional Radiology Pre-Procedure Handoff/Checklist  Bone biopsy -L5    NPO:  Yes  Tube feed Stopped: N/A  Anticoagulants:none  Telemetry: Yes -NSR  Transport mode: Stretcher  Number of Transporters needed: 1  Hover Mat:  N/A  Is patient changed into a hospital gown? Yes  Reminded floor RN to have patient remove jewelry and leave valuables on the unit: Yes  IV access:   Hemodialysis Catheter:  Tunneled Double lumen Left Internal Jugular (Active)   Insertion Site Description Clean;Dry 10/03/23 1025   Phlebitis Scale Grade 0 10/03/23 1025   Infiltration Scale Grade 0 10/03/23 1025   Proximal Lumen Status Flushed;Heparin  locked;Capped 10/03/23 1025   Distal Lumen Status Flushed;Heparin  locked;Capped 10/03/23 1025   Dressing Type Transparent CHG 10/03/23 1025   Dressing Status Clean, dry, and intact 10/03/23 1025   Dressing Change New 10/01/23 1240   (T)Transparent Drsg Change- (Every 7 Days) Dressing changed 10/01/23 0821   Dressing Change Due 10/08/23 10/03/23 1025   Needleless Access Device(s) Changed? Yes 10/03/23 1025   Needleless Access Device(s) Change Due 10/05/23 10/03/23 1025   Heparin /TPA Dwell Yes - Heparin  10/03/23 1025   Freq of line accesses and lab draws addressed? Yes 10/03/23 1025   Extravasation No 10/03/23 1025       Peripheral IV 09/30/23 1945 Ultrasound guided 22 G 2.0 inches Right Forearm (middle third) (Active)   Phlebitis Scale Grade 0 10/03/23 0125   Infiltration Scale Grade 0 10/03/23 0125   Line Status Flushed;Normal Saline locked;No blood return;Capped 10/03/23 0125   Dressing Type Transparent 10/03/23 0125   Dressing Status Clean, dry, and intact 10/03/23 0125   Extravasation No 10/03/23 0125     Respiratory: Room Air  Consentable:yes  Alert and Oriented to person, place and time?: yes  Code Status: Full Code  Does patient wear an insulin  pump? N/A  Precautions:none  Allergies:   Allergies   Allergen Reactions    Penicillins Swelling     Face    Coconut Flavoring Agent  (Non-Screening) Nausea And Vomiting     All sources coconut (including raw) --causes regurgitation     Amoxicillin Rash    Baclofen Other (See Comments)     confusion    No Known Latex Allergy          Shane LITTIE Sartorius, RN Received Handoff Report from [place] for IR procedure 12:49 PM

## 2023-10-03 NOTE — Anesthesia Preprocedure Evaluation (Addendum)
 Anesthesia Pre-operative History and Physical for Shane Pearson    Highlighted Issues for this Procedure:  Shane Pearson is a 63 y.o. 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    Medical History:  -  has a past medical history of Arthritis, CHF (congestive heart failure), Dialysis patient, ESRD (end stage renal disease), GERD (gastroesophageal reflux disease), Hypercholesterolemia, Hypotension, Ischemic colitis, and Thyroid disease.  - hypotension on  fludrocortisone  and midodrine     Anesthetic history:  08/09/21 IR tunneled central line: MAC w/ NC, uncomplicated  06/17/20: IR discontinue line:  MAC w/ NC, uncomplicated  02/12/18: IR tunneled central line: GA w/ LMA 4, uncomplicated    Allergies:   -- Penicillins -- Swelling    --  Face   -- Coconut Flavoring Agent (Non-Screening) -- Nausea And Vomiting    --  All sources coconut (including raw) --causes             regurgitation   -- Amoxicillin -- Rash   -- Baclofen -- Other (See Comments)    --  confusion   -- No Known Latex Allergy     Habitus:  Estimated body mass index is 29.29 kg/m as calculated from the following:    Height as of 10/01/23: 1.651 m (5' 5).    Weight as of 10/01/23: 79.8 kg (176 lb).    Electrophysiology/AICD/Pacer:  EKG 06/17/23: NSR    .  [name]  Anesthesia Evaluation Information Source: records, patient     ANESTHESIA HISTORY     Denies anesthesia history  Pertinent(-):  No History of anesthetic complications or Family hx of anesthetic complications    GENERAL     Denies general issues  Pertinent (-):  No history of anesthetic complications or Family Hx of Anesthetic Complications    HEENT     Denies HEENT issues PULMONARY  Pertinent(-):  No smoking, asthma or shortness of breath    CARDIOVASCULAR  Good(4+METs) Exercise Tolerance    + CHF  Pertinent(-):  No hypertension, angina, CAD or dysrhythmias    Comment: Low BP -  On  fludrocortisone  and on midodrine     GI/HEPATIC/RENAL   NPO: > 8hrs ago (solids) and > 2hrs ago (clears)      + GERD          well controlled    + Renal Issues (dialyzed 7/24)          ESRD, hemodialysis  Pertinent(-):  No alcohol use  NEURO/PSYCH/ORTHO     Denies neuro/psych/ortho issues  Pertinent(-):  No seizures, cerebrovascular event or positioning issues    ENDO/OTHER    + Thyroid Disease          HYPOthyroid   Pertinent(-):  No diabetes mellitus    HEMATOLOGIC    + Arthritis       Physical Exam    Airway            Mouth opening: normal            Mallampati: III            TM distance (fb): >3 FB            Neck ROM: full  Dental    Upper: partial Lower: partial   Cardiovascular  Normal Exam      Neurologic    Normal Exam     Pulmonary   Normal Exam  Mental Status   Normal Exam         ________________________________________________________________________  PLAN  ASA Score  3  Anesthetic Plan general       Induction (routine IV) General Anesthesia/Sedation Maintenance Plan (IV bolus);  Airway Manipulation (none); Airway (nasal cannula); Line ( use current access); Monitoring (standard ASA); Positioning (supine); PONV Plan (no N2O and no volatile agents); Pain (per surgical team); PostOp (PACU)Standard Attestation  Informed Consent     Risks:          Risks discussed were commensurate with the plan listed above with the following specific points: N/V, aspiration, sore throat, unsteadiness, fatigue and hypotension, Damage to: eyes, nerves, teeth and blood vessels, allergic Rx, unexpected serious injury and awareness.    Anesthetic Consent:         Anesthetic plan (and risks as noted above) were discussed with patient    Plan also discussed with team members including:       CRNA    Responsible Anesthesia Provider Attestation:  I attest that the patient or proxy understands and accepts the risks and benefits of the anesthesia plan. I also attest that I have personally performed a pre-anesthetic examination  and evaluation, and prescribed the anesthetic plan for this particular location within 48 hours prior to the anesthetic as documented. Ozell Pedro, MD  10/04/23, 7:57 AM

## 2023-10-03 NOTE — Progress Notes (Signed)
 Diagnostic Imaging Nurse Handoff    Study: MRI  Neuro Status:Alert and Oriented x3, can follow commands and is consentable  Telemetry:Yes- Telemetry monitoring is required while patient is on their unit but, has off-telemetry orders that are documented and appropriate while in MRI department  IV Access:  Hemodialysis Catheter:  Tunneled Double lumen Left Internal Jugular (Active)   Insertion Site Description Clean;Dry 10/03/23 0125   Phlebitis Scale Grade 0 10/03/23 0125   Infiltration Scale Grade 0 10/03/23 0125   Proximal Lumen Status Blood return noted;Flushed;Heparin  locked;Capped 10/01/23 1240   Distal Lumen Status Blood return noted;Flushed;Heparin  locked;Capped 10/01/23 1240   Dressing Type Transparent CHG 10/01/23 1440   Dressing Status Clean, dry, and intact 10/03/23 0125   Dressing Change New 10/01/23 1240   (T)Transparent Drsg Change- (Every 7 Days) Dressing changed 10/01/23 0821   Dressing Change Due 10/05/23 10/01/23 1240   Needleless Access Device(s) Changed? Yes 10/01/23 1240   Needleless Access Device(s) Change Due 10/03/23 10/01/23 1240   Heparin /TPA Dwell Yes - Heparin  10/01/23 1240   Extravasation No 10/03/23 0125       Peripheral IV 09/30/23 1945 Ultrasound guided 22 G 2.0 inches Right Forearm (middle third) (Active)   Phlebitis Scale Grade 0 10/03/23 0125   Infiltration Scale Grade 0 10/03/23 0125   Line Status Flushed;Normal Saline locked;No blood return;Capped 10/03/23 0125   Dressing Type Transparent 10/03/23 0125   Dressing Status Clean, dry, and intact 10/03/23 0125   Extravasation No 10/03/23 0125     Continuous Infusions:No  Arterial line/transducer present:No.  Additional lines and drains: None  Medication Patches/Insulin  Pumps:No  Precautions:None (Universal)  Patient Readiness for MRI:: Patient dressed in MRI safe scrubs or gown   Transport:Stretcher  Hover Requested:No, Patient can ambulate Independently   Respiratory:Room Air  Sleep Apnea:No  Claustrophobic: No  Pain  Concerns:No  Sedation/Pain/Medication Plan :NA  Able to Lay Flat for the Duration of the Scan:Yes  Code status: Full Code    Shane LITTIE Sprague, RN Received Handoff Report from [facility] for MRI 1:30 AM

## 2023-10-03 NOTE — Progress Notes (Signed)
 Report Given To Bedside Nurse  Pt arrived to the unit hypotensive, with MAP below 65, midodrine  given for BP, pt BP re-checked later, MAP above 65, dialysis started per order. Pt back on Tele per order. Left CVC used during HD tx, line reversed during tx, BFR at 350 due to high arterial pressure. Patient tolerated 3 hours of HD Treatment, system clotted during tx with 1 hour of tx still remaining, pt requested to come off 1 hour earlier, MD aware. Blood drawn per order. Removed Net Fluid Vol of  1.4 L. VS stable during and post HD. No complaints prior to HD discharge.     Descriptive Sentence / Reason for Admission   Duration of Treatment (minutes): 180 min   Complications: system clotted, was able to return pt. Pt hypotensive during tx, midodrine  given prior starting HD. Pt hypotensive post tx.     Active Issues / Relevant Events   Review of medications administered  such as routine meds, antibiotics, and heparin : Catheter closed with Heparin    Vital Signs: see Flowsheet    To Do List  Medications still needing administration: n/a  Dressing change due : 10/08/2023      Anticipatory Guidance / Discharge Planning  Date/Time of Next HD Treatment: TBD by Nephrology  Please weigh patient daily to monitor fluid gains.    Thank you.

## 2023-10-03 NOTE — Invasive Procedure Plan of Care (Signed)
 CONSENT FOR MEDICAL  OR SURGICAL PROCEDURE                            Patient Name: Shane Pearson  [facility] MR                                                              DOB: October 02, 1960         Please read this form or have someone read it to you.   It's important to understand all parts of this form. If something isn't clear, ask us  to explain.   When you sign it, that means you understand the form and give us  permission to do this surgery or procedure.     I agree for Waymon Lenis, MD,PhD , and Vascular and Interventional Radiology Attendings, Fellows, Residents and Advanced Practice Providers   along with any assistants* they may choose, to treat the following condition(s): Abnormal tissue needing sampling.   By doing this surgery or procedure on me: Introduction of a needle under image guidance in to abnormal tissue and obtaining tissue samples   This is also known as: Biopsy of L5 lesion. Possible soft tissue biopsy of paraspinal lesions.   Laterality: Undetermined     *if you'd like a list of the assistants, please ask. We can give that to you.    1. The care provider has explained my condition to me. They have told me how the procedure can help me. They have told me about other ways of treating my condition. I understand the care provider cannot guarantee the result of the procedure. If I don't have this procedure, my other choices are: Not to perform the procedure.    2. The care provider has told me the risks (problems that can happen) of the procedure. I understand there may be unwanted results. The risks that are related to this procedure include: Bleeding, infection, damage to surrounding organs and structures, allergic reaction to contrast agent, and in very rare circumstances, death.    3. I understand that during the procedure, my care provider may find a condition that we didn't know about before the treatment started. Therefore, I agree that my care provider can perform any other treatment  which they think is necessary and available.    4. I give permission to the hospital and/or its departments to examine and keep tissue, blood, body parts, fluids or materials removed from my body during the procedure(s) to aid in diagnosis and treatment, after which they may be used for scientific research or teaching by appropriate persons. If these materials are used for science or teaching, my identity will be protected. I will no longer own or have any rights to these materials regardless of how they may be used.    5. My care provider might want a representative from a medical device company to be there during my procedure. I understand that person works for:          The ways they might help my care provider during my procedure include:            6. Here are my decisions about receiving blood, blood products, or tissues. I understand my decisions cover the time before, during and  after my procedure, my treatment, and my time in the hospital. After my procedure, if my condition changes a lot, my care provider will talk with me again about receiving blood or blood products. At that time, my care provider might need me to review and sign another consent form, about getting or refusing blood.    I understand that the blood is from the community blood supply. Volunteers donated the blood, the volunteers were screened for health problems. The blood was examined with very sensitive and accurate tests to look for hepatitis, HIV/AIDS, and other diseases. Before I receive blood, it is tested again to make sure it is the correct type.    My chances of getting a sickness from blood products are small. But no transfusion is 100% safe. I understand that my care provider feels the good I will receive from the blood is greater than the chances of something going wrong. My care provider has answered my questions about blood products.    Moderate Sedation:  The moderate sedation procedure has been explained to me as outlined  below and my questions/concerns regarding the sedation have been answered/addressed to my satisfaction.    I understand that alternatives to receiving moderate sedation for this procedure include not undergoing the procedure, or undergoing the procedure without sedation. If neither of these options is acceptable, there may be other clinical options that I can discuss with my healthcare provider.   I understand that moderate sedation is a state of altered consciousness that is usually created by the administration of intravenous medications (medications I.V.). Patients under moderate sedation often feel sleepy and very relaxed, but will respond to commands. The purpose of moderate sedation is to make me comfortable during my procedure. This can also make the procedure easier to perform. I understand that while many patients undergoing moderate sedation will have little to no memory of the sedation or the procedure, awareness during the procedure is possible. Again, the goal is to make me comfortable.   I understand that I will be carefully monitored throughout my sedation and procedure to keep me safe. I will not be discharged until the effects of my sedation have worn off sufficiently for me to stay safe. I must not drive, operate dangerous machinery or make important decisions until the next day following my sedation, and I must be accompanied by a responsible adult when I go home.  I understand that no medical treatment/procedure is without risk, and this includes moderate sedation, although risks are rare. These include excessive sedation which may require special procedures to keep me safe. I understand some patients may experience nausea and vomiting after sedation. Rarely, patients develop unstable heart rhythms or breathing difficulties, which can lead to death in very rare circumstances.   By signing this document I acknowledge that I have read this consent and had the opportunity to discuss this with my  healthcare provider. I also agree to allow my provider to perform any procedure he/she deems necessary to keep me safe during my sedation.       My decision  about blood or  blood products           My decision   about tissue  Implants              I understand this  form.    My care provider  or his/her  assistants have  explained:   What I am having done and why I need it.  What other  choices I can make instead of having this done.  The benefits and possible risks (problems) to me of having this done.  The benefits and possible risks (problems) to me of receiving transplants, blood, or blood products.  There is no guarantee of the results.  The care provider may not stay with me the entire time that I am in the operating or procedure room.  My provider has explained how this may affect my procedure. My provider has answered my  questions about this.         I give my  permission for  this surgery or  procedure.            _________________________________________                                     My signature  (or parent or other person authorized to sign for you, if you are unable to sign for yourself or if you are under 32 years old)             _____________     Date      (MM/DD/YYYY)           _______    Time      Electronic Signatures will display at the bottom of the consent form.    Care provider's statement: I have discussed the planned procedure, including the possibility for transfusion of blood  products or receipt of tissue as necessary; expected benefits; the possible complications and risks; and possible alternatives  and their benefits and risks with the patients or the patient's surrogate. In my opinion, the patient or the patient's surrogate  understands the proposed procedure, its risks, benefits and alternatives.              Electronically signed by: Katrinka Derry, MD                                                10/03/2023         Date        1:31 PM        Time

## 2023-10-03 NOTE — Progress Notes (Signed)
 Neurosurgery Progress Note    24hr events & Subjective:   Hospital Day: 3  POD: * No surgery found *   Significant events:    NAEO  Plan for IR biopsy of L5 lesion today    Objective:   Examination:   GEN: No apparent distress  Mental Status: Awake and oriented x 3   Cranial Nerves: PERRL, face symmetric, tongue midline  Motor Exam: Exam pain limited (L>R upper and lower extremity pain with movement). 4/5 shoulder abduction bilaterally. 4/5 L bicep/tricep, Otherwise 5/5 throughout.  Sensory Exam: Sensation intact to light touch throughout b/l upper and lower extremities.    Assessment/ Plan:     Assessment: 63 y.o. male with ESRD on HD, MRI with multiple osseous lesions throughout cervical, throacic, and lumbar spine and paraspinal musculature.    Plan:   - L5 vertebral body biopsy with IR today  - Blood cultures: NGTD  - Continue trending ESR/CRP every other day  - Further management pending IR biopsy findings  - DVT prophylaxis: SCDs, SQH 24 hours post op    Please page the on call Neurosurgery 1st call resident for any questions regarding this patient's care.  Signed by DENNISE SLEDGE, MD on 10/03/2023 at 11:51 AM    Note created using Dragon Voice-to-Text software. Typographical and grammatical errors may exist despite proofreading

## 2023-10-03 NOTE — Progress Notes (Signed)
 Report Given To  Vernell Birmingham RN      Descriptive Sentence / Reason for Admission   Team:    Presented with:intermittent (L) shoulder pain x 2 weeks. Inability to ambulate x 1 week r/t (R) leg pain. + muscle aches + fatigue + weight loss + night sweats + poor PO intake  Relevant PMHx:ESRD HD (T,Th,S) Osteoarthritis, Hypotension  Ik:ipjmmyzj           Active Issues / Relevant Events   Shift Event (last 24 hours):    7/23 N NPO since 0001 am (7/24). BP on soft side, midodrine  and florinef  given. Tele discontinued by provider Brad prior to MRI. Patient trasported to MRI at 5am and after MRI will transport directly to hemodialysis. Morning labs will be collected by HD nurse Mont Bench RN. Provider Brad Broody PA is aware and will reorder tele once the patient in hemodialysis. Patient's Smart phone given to dayshift charge nurse Edsel SQUIBB. RN.    Precautions:        AOX:X4  Mobility: Ind. With RW  Pills: Whole  Meals: NPO (5/24)  Continent: (yes/no)  IV:  RIGHT ARM   (drsg due__)   (Line cap change due__)  IVF/gtts:     Drains/tubes/O2/Bipap/CPAP:     Patient Behaviors:Calm           To Do List  VS:Q4  Tele: YES   Sticker Change Daily []   Wound Care:  Labs/Tests: ESRD, dialysis T/Th/Sat (received dialysis 07/19)   BGs:                                                                             Daily Weights: no                                            Daily Bath (soap & water /CHG): ___    Restraints: (no)  Violent or nonviolent  When restraints order renewal needed:___         Anticipatory Guidance / Discharge Planning  Dentures: Uppers []  Lowers [] , Hearing Aids R[]  L[] , Glasses []   Belongings in [employer] []   Support Person/Contact:   Tasks Pending:  Education Needed:                                                                          D/C to:

## 2023-10-03 NOTE — Progress Notes (Signed)
 Report Given To  Magluyan, Jocelyn, RN       Descriptive Sentence / Reason for Admission   Team:    Presented with:intermittent (L) shoulder pain x 2 weeks. Inability to ambulate x 1 week r/t (R) leg pain. + muscle aches + fatigue + weight loss + night sweats + poor PO intake  Relevant PMHx:ESRD HD (T,Th,S) Osteoarthritis, Hypotension  Ik:ipjmmyzj           Active Issues / Relevant Events   Shift Event (last 24 hours):  7/23 D: Labs sent, soft BP throughout shift provider aware. Worked with PT, OOB to chair, potassium trended down currently 5.0. C/o blurred vision to provider, NPO at midnight possible biopsy tomorrow. More PT work up for discharge.   7/23 N BP on soft side, midodrine  and florinef  given. Tele discontinued by provider Brad prior to MRI. Patient trasported to MRI at [facility] and after MRI will transport directly to hemodialysis. Morning labs will be collected by HD nurse Mont Bench RN. Provider Brad Broody PA is aware and will reorder tele once the patient in hemodialysis.  7/24 D12: MRI, then HD only dialyzed for 3 hours, IR lumbar biopsy completed today, however IR called and not all needed specimens collected. Pt to go to IR for another spine biopsy 7/25.   Precautions:        AOX:X4  Mobility: Cane  Pills: Whole  Meals: Independent  Continent: (yes/no)  IV:     (drsg due__)   (Line cap change due__)  IVF/gtts:     Drains/tubes/O2/Bipap/CPAP:     Patient Behaviors:Calm           To Do List  VS:Q4  Tele: No   Sticker Change Daily []   Wound Care:  Labs/Tests: ESRD, dialysis T/Th/Sat (received dialysis 07/19)   BGs:                                                                             Daily Weights: no                                            Daily Bath (soap & water /CHG): ___    Restraints: (no)  Violent or nonviolent  When restraints order renewal needed:___         Anticipatory Guidance / Discharge Planning  Dentures: Uppers []  Lowers [] , Hearing Aids R[]  L[] , Glasses []   Belongings in  [employer] []   Support Person/Contact:   Tasks Pending:  Education Needed:                                                                          D/C to:

## 2023-10-03 NOTE — Progress Notes (Signed)
 Vancomycin  (initial dosing) - HD/AKI/CRRT/PD    Vancomycin  has been initiated for Bone and Joint infection     Vancomycin  Monitoring Strategy and Goal: Dose by level < 20 mcg/ml    Renal Function: Intermittent HD     Will start vancomycin  1250 mg x 1 at 1700   Vancomycin  concentration to be ordered when next dialysis session is scheduled.  Pharmacist will follow clinical course and will order vancomycin  concentrations as indicated.    For questions contact pharmacy at extension [phone]    Kraig Mura, PharmD

## 2023-10-03 NOTE — Progress Notes (Signed)
 Neurosurgery Progress Note    24hr events & Subjective:   Hospital Day: 3  POD: * No surgery found *   Significant events:    At MRI this am - will evaluate once back  IR biopsy pending completion of imaging    Objective:   Examination:   Patient out at imaging this am    Assessment/ Plan:     Assessment: 63 y.o. 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  Undergoing workup for malignancy versus infection.  Laboratory markers extremely elevated.    Plan:   IR biopsy pending completion of MRIs  DVT prophylaxis: SCDs, SQH 24 hours post op    Please page the on call Neurosurgery 1st call resident for any questions regarding this patient's care.  Signed by DENNISE SLEDGE, MD on 10/03/2023 at 5:23 AM    Note created using Dragon Voice-to-Text software. Typographical and grammatical errors may exist despite proofreading

## 2023-10-03 NOTE — Progress Notes (Signed)
 Imaging Sciences Nursing Procedure Note    Shane Pearson  [id number]  Procedure (as described by patient): Bone biopsy      Status:Completed. Patient tolerated procedure well  Specimen collection:yes  Sponge count:N/A  Fluid removed:N/A  Procedure dressing site location:Back  Dressing type:sterile gauze and tegaderm  Dressing status:Clean, dry and intact   Hematoma:Not evident  Tube labeled:N/A  Medication received:Versed  1mg  and Lidocaine  10 ml  Cardiovascular: Peripheral Pulses N/A   Implant patient information given to patient or parent/guardian:N/A  Radiation overexposure: No  Report given un:[facility] recovery nurse. Name: Shane Pearson and Unit Nurse. Unit [id number] Name Shane Pearson  Last Filed Vitals    10/03/23 1530   BP: 95/59   Pulse: 78   Resp: 18   Temp:    SpO2: 95%

## 2023-10-03 NOTE — Progress Notes (Signed)
 10/03/23 1338   Treatment Attempt   Treatment Attempt Patient off the unit at 2 seperate procedures when approached for PT today. Will re approach as time allows.     Jama Molt , PTA  (Please contact Physical Therapist on the treatment team via secure chat or via the secure group chat for your unit Physical Therapist with any questions. On weekends / holidays , please utilize the secure group chat , [facility] [place] / [facility] Physical Therapy 1st call, to contact PT. )

## 2023-10-03 NOTE — Progress Notes (Signed)
 Nephrology  10/03/2023 8:50 AM    Shane Pearson was seen and examined on dialysis.    Interval Events:    No acute events overnight.  Reports two months of progressive anorexia, fatigue, and weight loss.   Chronic hypotension on dialysis.  Managed with midodrine  and florinef .  Shane reports use of sodium modelling at [facility], but orders there do not reflect sodium modelling. Awaiting I.R. biopsy of C7 for ? Osteomyelitis vs. Mallignancy.    Objective:  Hzw:Jtjxz, alert, oriented ; lucid; NAD  Blood pressure 94/62, pulse 78, temperature 36.2 C (97.2 F), temperature source Temporal, resp. rate 18, height 1.651 m (5' 5), weight 79.8 kg (176 lb), SpO2 99%.  Eyes:  Conj pale; gaze conjugate; no scleral icterus  ENT: Normal appearing facial features  Chest:  Clear with normal effort   ; An HDTC is in the right Chest  CVS: reg; no rub; no edema        Recent Labs   Lab 10/03/23  0711 10/02/23  1751 10/02/23  0954 10/02/23  0611 10/01/23  0122 09/30/23  0625   Sodium 132* 133 132* 131* 137 135   Potassium 5.2* 5.4* 5.0 6.3* 5.5* 5.2*   Chloride 94* 94* 94* 94* 90* 89*   CO2 21 21 21 21 26  29*   UN 56* 50* 44* 41* 60* 48*   Creatinine 10.50* 10.10* 9.14* 8.73* 12.10* 10.50*   Glucose 77 74 67 75 82 84   Calcium  8.9 8.4* 9.0 8.8 9.0 9.0   Albumin 3.1*  --   --  3.2* 3.4* 3.4*   Phosphorus  --   --   --  4.6* 4.6* 4.6*      Recent Labs   Lab 10/03/23  0711 10/02/23  0611 10/01/23  0122   WBC 11.6* 15.3* 15.6*   Hemoglobin 9.4* 9.7* 10.9*   Hematocrit 31* 34* 37   Platelets 481* 451* 527*          * Femur LEFT standard AP and Lateral  Result Date: 10/02/2023  Patchy mixed radiolucency and sclerosis of the proximal femur and patchy sclerosis of the ischium. No pathologic fracture seen. END OF IMPRESSION     * Femur RIGHT standard AP and Lateral  Result Date: 10/02/2023  Patchy decreased bone mineralization of the proximal femur and patchy sclerosis of the ischium. No pathologic fracture perceived. END OF IMPRESSION     CT  abdomen and pelvis with IV contrast  Result Date: 10/01/2023  Nodal and extensive intramuscular lesions as described, likely metastases. Right adrenal lesion is indeterminate but also suspicious for metastatic disease given context. No clear primary malignancy identified in the abdomen or pelvis. END OF IMPRESSION     Chest single frontal view  Result Date: 10/01/2023  Trace right effusion remaining. No evidence of pneumothorax. Small left effusion unchanged. END OF IMPRESSION     MR cervical spine without and with contrast  Result Date: 10/01/2023  Decreased sensitivity/specificity secondary to patient motion and poor signal on several sequences. Expansile enhancing lesion within the C7 vertebral body with soft tissue components extending into the prevertebral space and the left anterolateral epidural space. Additionally, there is suspected soft tissue component extending into the left C6-C7 and C7-T1 neuroforamen which causes severe left foraminal narrowing at C7-T1 and moderate to severe left foraminal narrowing at C6-C7. Associated trace fluid within the prevertebral space. Overall, findings are suspicious for osseous metastatic disease. Additional enhancing lesions within the left suboccipital musculature at the  level of C4. Possible additional subcentimeter lesions within the left paraspinal musculature at the level of C3-C4 and the left posterior neck base. These are suspicious for additional metastatic lesions. Borderline enlarged right level 2 lymph node with slightly rounded morphology. Attention on follow-up imaging is recommended. Prominent STIR hyperintensity along the anteroinferior endplate of C4 with ill-defined enhancement, favoring degenerative changes. Attention on follow-up imaging is recommended. Partially visualized moderate to large pleural effusions. END OF IMPRESSION            Assessment/Plan  Shane Pearson is a 63 y.o. Male  ESRD on HD admitted for chest pain, weight loss, anorexia on 7/19.   Work-up concerning of malignancy.      Plan:  1. Hemodialysis Note:   Presently dialyzing for 3 hours:     F180 Na 138 K 2 Ca 2.5 HCO3 35 Qb: 400 ml/min Qd: 1.5X Qb UF 2 L as hemodynamics tolerates     EDW = TBD.  Low potassium, low phosphorus, low sodium diet please  Dose all medications for creatinine clearance < 10 ml/min   Limit beverage intake to 1.5 L per day  Give Nephrovite or similar renal vitamin daily  Active Dialysis Orders   Dialysis    Hemodialysis     Frequency: Every Tues,Thurs & Sat     Start Date/Time: 10/03/23 0600      Number of Occurrences: Until Specified     Order Questions:      Dialyzer F180      K/Ca (meq) 2K/2.5Ca      Na (meq) 135      Na Modeling None      Bicarb (meq) 30      Dialysate Temp (C) 36      BFR (mL/min) 400      DFR (mL/min) 1.5x BFR      Duration (hours) 4      EDW (kgs) TBD      Access CVC      Vital Signs Vitals signs at the start of treatment and every 30 minutes      Vital Signs Check temperature at the start of treatment and at the end of treatment      Monitoring May place patient on telemtry per nursing descretion for concerns regarding stability      Notify dialysis provider for Pulse Less Than 50      Notify dialysis provider for Pulse Greater Than 130      Notify dialysis provider for Temperature (Celsius) Above 38      Notify dialysis provider for Systolic Blood Pressure Below 90      Notify dialysis provider for Systolic Blood Pressure Above 819      Fluid removal (L) 0.5-3L , map over 65      Fluid removal instructions Decrease fluid removal goal by 500 ml  if blood pressure is decreasing or HR increasing. Reassess response every 15 minutes.      Fluid removal instructions May increase fluid removal goal by 500 ml if blood pressure and HR are stable.  Reassess response every 15 minutes.      Fluid removal instructions Decrease fluid removal to minimum if call/hold parameters for BP and/or HR exceeded.  Reassess response every 15 minutes.      Fluid removal  instructions If no BP/HR response in 15 minutes after notifying MD, give 200 ml isotonic saline.  Reassess response every 15 minutes.         2.  Anemia:   Holding  epogen at this time.      3.  Hypertension and volume:   On Midodrine .  U.F. as able.    4.  Hyperkalemia:  2 k bath at H.d.  Low K diet when eating    5.  Hyperparathyroidism and hyperphosphatemia:    Phos level acceptable  Continue low phos diet.  Continue sensipar .    6.   Dialysis Access: CVC in place     Author: CHARLIE CATER, MD  as of: 10/03/2023  at: 8:50 AM

## 2023-10-03 NOTE — Progress Notes (Addendum)
 Hospital Medicine Service Attending Progress Note    Significant Events/Subjective:   Patient having spinal completion MRI this morning, also going for hemodialysis.  Potential IR biopsy today.  Yesterday patient's pain was improved without oxycodone .  He was surprised by this.  He used 1 oxycodone  within the last 24 hours.  Continues to have Tylenol  3 times daily.  Vitals remained stable.  Pleural fluid Gram stain's still negative with no growth to date on cultures, though 10-25 nucleated white cells seen.    Evaluated on rounds with HMD APP.  Patient was in hemodialysis unit this morning when I initially spoke to.  Reevaluated when he was back in the unit.  Notes he had MRI to complete spine imaging this morning.  Is overall feeling okay.  Patient's wife [name] was on the phone during evaluation/encounter.  All questions answered/concerns discussed.  Still does not have a great appetite.  Had 2 large bowel movements that he was happy about.  Shoulder pain has resolved.  Denies chest pain, shortness of breath, nausea/vomiting/abdominal pain currently.      Objective:     Physical Exam  Temp:  [36.2 C (97.2 F)-36.9 C (98.4 F)] 36.2 C (97.2 F)  Heart Rate:  [74-93] 78  Resp:  [18-26] 18  BP: (80-118)/(48-66) 94/62       Constitutional: Laying in bed, talkative, no acute distress, alert  Neck: Supple   Eyes: EOMI intact except for left eye convergence   ENT: Dry oral mucosa, upper dentures, nares patent  CV: Regular rate, regular rhythm, no murmur/rub/gallops, no pedal edema  Resp: Normal work of breathing on room air, improved aeration aeration bilaterally without adventitious lung sounds  GI: Abdomen soft, nontender, somewhat distended, no organomegaly  GU: No Foley  MSK: Able to move all extremities spontaneously, normal bulk  Skin: Warm, dry, intact  Neuro: AO x 4  Psych: Good insight, fair mood, reasonable    Recent Lab Studies:  Personally reviewed and notable for:  Recent Labs   Lab 10/03/23  0711  10/02/23  0611 10/01/23  0122   WBC 11.6* 15.3* 15.6*   Hemoglobin 9.4* 9.7* 10.9*   Hematocrit 31* 34* 37   Platelets 481* 451* 527*      Recent Labs   Lab 10/03/23  0711 10/02/23  1751 10/02/23  0954 10/02/23  0611 10/01/23  0122 09/30/23  0625   Sodium 132* 133 132* 131* 137 135   Potassium 5.2* 5.4* 5.0 6.3* 5.5* 5.2*   Chloride 94* 94* 94* 94* 90* 89*   CO2 21 21 21 21 26  29*   UN 56* 50* 44* 41* 60* 48*   Creatinine 10.50* 10.10* 9.14* 8.73* 12.10* 10.50*   Glucose 77 74 67 75 82 84   Calcium  8.9 8.4* 9.0 8.8 9.0 9.0   Albumin 3.1*  --   --  3.2* 3.4* 3.4*   Phosphorus  --   --   --  4.6* 4.6* 4.6*     Recent Labs   Lab 09/30/23  1943   INR 1.4*   Protime 15.7*       Recent Imaging Studies:  * Femur LEFT standard AP and Lateral  Result Date: 10/02/2023  Patchy mixed radiolucency and sclerosis of the proximal femur and patchy sclerosis of the ischium. No pathologic fracture seen. END OF IMPRESSION     * Femur RIGHT standard AP and Lateral  Result Date: 10/02/2023  Patchy decreased bone mineralization of the proximal femur and patchy sclerosis of  the ischium. No pathologic fracture perceived. END OF IMPRESSION     CT abdomen and pelvis with IV contrast  Result Date: 10/01/2023  Nodal and extensive intramuscular lesions as described, likely metastases. Right adrenal lesion is indeterminate but also suspicious for metastatic disease given context. No clear primary malignancy identified in the abdomen or pelvis. END OF IMPRESSION     Chest single frontal view  Result Date: 10/01/2023  Trace right effusion remaining. No evidence of pneumothorax. Small left effusion unchanged. END OF IMPRESSION     MR cervical spine without and with contrast  Result Date: 10/01/2023  Decreased sensitivity/specificity secondary to patient motion and poor signal on several sequences. Expansile enhancing lesion within the C7 vertebral body with soft tissue components extending into the prevertebral space and the left anterolateral epidural  space. Additionally, there is suspected soft tissue component extending into the left C6-C7 and C7-T1 neuroforamen which causes severe left foraminal narrowing at C7-T1 and moderate to severe left foraminal narrowing at C6-C7. Associated trace fluid within the prevertebral space. Overall, findings are suspicious for osseous metastatic disease. Additional enhancing lesions within the left suboccipital musculature at the level of C4. Possible additional subcentimeter lesions within the left paraspinal musculature at the level of C3-C4 and the left posterior neck base. These are suspicious for additional metastatic lesions. Borderline enlarged right level 2 lymph node with slightly rounded morphology. Attention on follow-up imaging is recommended. Prominent STIR hyperintensity along the anteroinferior endplate of C4 with ill-defined enhancement, favoring degenerative changes. Attention on follow-up imaging is recommended. Partially visualized moderate to large pleural effusions. END OF IMPRESSION       Assessment:     Shane Pearson is a  63 y.o. male with ESRD (on HD TTS), hypotension, and lumbosacral spondylosis who presented to the ED for chest pain and left shoulder pain. Initial work up was notable for mild leukocytosis, chronic anemia, thrombocytosis, elevated NT-pro BNP, elevated but adynamic troponin, elevated AST and Alk Phos, and ESRD.  Initial chest x-ray demonstrating right sided opacity, concern for atelectasis and minimal right pleural effusion.  He was admitted for constitutional symptoms and workup concerning for occult infectious process, rheumatologic disorder or malignancy.  Further workup since admission is highly suggestible for metastatic disease of unclear primary with multiple cervical spine lesions with extension, multiple nodal/intramuscular lesions on CT A/P, b/l exudative pleural effusions s/p tap (pending cytology).  Currently, patient is clinically stable, pending further diagnostic results  and is planned for IR biopsy 7/24 of safest lesion, pending completed spine imaging.    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 Uric acid for TLS evaluation 7.4  -Blood cultures 7/22 2300: Negative to date  -Concerning for underlying malignancy.  This was discussed with patient.    - Follow-up thoracic and lumbar MRI for further disease  - Plan for biopsy with IR for pathology 7/24; interdisciplinary treatment with neuroradiology, neurosurgery.  Defer to them on best option for target.  Remains NPO  - Heme/onc following, appreciate ongoing recommendations.  For suspected malignancy of unclear primary; if PET scan inpatient possible? Will consult Rad Onc 7/24  -Neurosurgery following, appreciate ongoing recommendations.  Will start vanc/cefepime /flagyl  for broad spectrum coverage s/p IR biopsy in case of cervical osteo - discussed with pharmacist Sarah Spitznogle over the phone as patient has PCN allergy  -  Pain control: 1000 mg Tylenol  scheduled 3 times daily, breakthrough oral oxycodone  as needed for bone pain.  Can progress to IV dilaudid  as needed (ESRD)    Large right exudative pleural effusion s/p thoracentesis  Moderate left pleural effusion  -Appears layered on CT  -No growth on Gram stain  -Pending cytology, culture    Vertical diplopia  -Ophthalmology following, planning for biopsy 7/25 for GCA; reached out to ophthalmology team 7/23 and 7/24 to confirm if plan is to still biopsy 7/25; NPO after midnight for this   -Wonder if there could be infiltration of cranial nerves and if additional brain imaging with MRI would be warranted considering acute onset and setting of potential malignancy.  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 If for primary prevention, will need to discontinue permanently. Will assess further with patient - per prior doc, does not appear to have hx of CVA/MI.  Currently being held in preparation for IR biopsy and GCA biopsy  Allergic rhinitis - Continue flonase     DVT PPx: Heparin  subcu    Emergency Contact: [name] (spouse/HCP) [phone]   Code Status: Full code    Discharge Planning:   MRDD: Unclear  Discharge Criteria/Barriers to Discharge: Further workup of potential malignancy including biopsy/outpatient connection, rule out cervical osteo  PT and/or OT Recommendations/Discharge to: PT recommending living facility.  OT recommending ongoing support for ADLs.    Appointments needed with: PCP, likely heme-onc, neurosurg, ophthalmology    Case discussed with HMD APP, neurosurgery, heme-onc, interdisciplinary team, patient and wife during rounds    I personally spent 50 minutes on the calendar day of the encounter, including pre and post visit work.      Terry Abila, MD on 10/03/2023 at 8:47 AM

## 2023-10-03 NOTE — Plan of Care (Signed)
 Interventional Radiology Preprocedure Note      Referring Provider: Barnett, Korinne, MD    Shane Pearson is a 63 y.o. male with history of ESRD, hypotension, lumbosacral spondylosis who presented to the ED 7/19 with chest pain and shoulder pain. Workup notable for mediastinal lymphadenopathy, bilateral pleural effusions, multiple osseous metastatic lesions throughout cervical, thoracic, and lumbar spine. Status post thoracentesis, cytology pending. Neurosurgery team engaged, concerned for metastasis vs osteo, IR has been consulted for bone biopsy.       Relevant Imaging:           Assessment/Plan:   IR will generate order for L5 vertebral body biopsy.  Plan to perform the procedure based on room availability. The bedside nurse will be contacted once scheduled.  Plan for moderate sedation    Preprocedural Requirements:   Need to hold prophylactic DVT medications? Yes   Are labs up to date (INR, plts, COVID)? Yes, INR 1.4, platelets 481   Does patient need to be NPO Yes, initiate midnight prior to procedure   Contrast allergy? No  Link to [employer] - Reaction Severity and Premedication   Bleeding concerns requiring medication hold (anticoagulants, anti-platelets) and plan? Yes, hold all antiplatelets and anticoagulation for procedure      Labs:      Lab results: 10/03/23  0711   WBC 11.6*   Hemoglobin 9.4*   Hematocrit 31*   RBC 3.8*   Platelets 481*            Lab results: 09/30/23  1943   INR 1.4*     No results found for this or any previous visit (from the past 744 hours).    Please page IR CONSULT Pager with questions.    Signed by Lavanda Sniff, PA on 10/03/2023 at 9:45 AM

## 2023-10-04 ENCOUNTER — Inpatient Hospital Stay

## 2023-10-04 ENCOUNTER — Inpatient Hospital Stay: Payer: Self-pay | Admitting: Anesthesiology

## 2023-10-04 ENCOUNTER — Ambulatory Visit: Admission: RE | Admit: 2023-10-04 | Source: Ambulatory Visit

## 2023-10-04 ENCOUNTER — Encounter: Admission: EM | Disposition: A | Discharge status: Home Health | Payer: Self-pay | Source: Ambulatory Visit

## 2023-10-04 DIAGNOSIS — M316 Other giant cell arteritis: Secondary | ICD-10-CM

## 2023-10-04 DIAGNOSIS — C7989 Secondary malignant neoplasm of other specified sites: Secondary | ICD-10-CM

## 2023-10-04 DIAGNOSIS — J910 Malignant pleural effusion: Secondary | ICD-10-CM

## 2023-10-04 HISTORY — PX: PR LIGATION/BIOPSY TEMPORAL ARTERY: 37609

## 2023-10-04 LAB — BASIC METABOLIC PANEL
Anion Gap: 20 — ABNORMAL HIGH (ref 7–16)
CO2: 18 mmol/L — ABNORMAL LOW (ref 20–28)
Calcium: 8.8 mg/dL (ref 8.6–10.2)
Chloride: 94 mmol/L — ABNORMAL LOW (ref 96–108)
Creatinine: 8 mg/dL — ABNORMAL HIGH (ref 0.67–1.17)
Glucose: 77 mg/dL (ref 60–99)
Lab: 39 mg/dL — ABNORMAL HIGH (ref 6–20)
Potassium: 4.6 mmol/L (ref 3.3–5.1)
Sodium: 132 mmol/L — ABNORMAL LOW (ref 133–145)
eGFR BY CREAT: 7 — AB

## 2023-10-04 LAB — CBC AND DIFFERENTIAL
Baso # K/uL: 0 THOU/uL (ref 0.0–0.2)
Eos # K/uL: 0.4 THOU/uL (ref 0.0–0.5)
Hematocrit: 33 % — ABNORMAL LOW (ref 37–52)
Hemoglobin: 9.7 g/dL — ABNORMAL LOW (ref 12.0–17.0)
IMM Granulocytes #: 0.2 THOU/uL — ABNORMAL HIGH
IMM Granulocytes: 1.2 %
Lymph # K/uL: 1.3 THOU/uL (ref 1.0–5.0)
MCV: 84 fL (ref 75–100)
Mono # K/uL: 1.5 THOU/uL — ABNORMAL HIGH (ref 0.1–1.0)
Neut # K/uL: 10.1 THOU/uL — ABNORMAL HIGH (ref 1.5–6.5)
Nucl RBC # K/uL: 0 THOU/uL (ref 0.0–0.1)
Nucl RBC %: 0.1 /100{WBCs} (ref 0.0–0.2)
Platelets: 449 THOU/uL (ref 150–450)
RBC: 3.9 MIL/uL — ABNORMAL LOW (ref 4.0–6.0)
RDW: 19 % — ABNORMAL HIGH (ref 0.0–15.0)
Seg Neut %: 75.3 %
WBC: 13.5 THOU/uL — ABNORMAL HIGH (ref 3.5–11.0)

## 2023-10-04 LAB — HEPATIC FUNCTION PANEL
ALT: 7 U/L (ref 0–50)
AST: 37 U/L (ref 0–50)
Albumin: 3.1 g/dL — ABNORMAL LOW (ref 3.5–5.2)
Alk Phos: 187 U/L — ABNORMAL HIGH (ref 40–130)
Bilirubin,Direct: <0.2 mg/dL (ref 0.0–0.3)
Bilirubin,Total: 0.2 mg/dL (ref 0.0–1.2)
Total Protein: 6 g/dL — ABNORMAL LOW (ref 6.3–7.7)

## 2023-10-04 LAB — POCT GLUCOSE: Glucose POCT: 94 mg/dL (ref 60–99)

## 2023-10-04 LAB — MAGNESIUM: Magnesium: 1.8 mg/dL (ref 1.6–2.5)

## 2023-10-04 LAB — GGT: GGT: 29 U/L (ref 8–61)

## 2023-10-04 LAB — PHOSPHORUS: Phosphorus: 4.1 mg/dL (ref 2.7–4.5)

## 2023-10-04 SURGERY — BIOPSY, ARTERY, TEMPORAL
Anesthesia: Monitor Anesthesia Care | Site: Eye | Laterality: Right | Wound class: Clean

## 2023-10-04 MED ORDER — LACTATED RINGERS IV SOLN *I*
20.0000 mL/h | INTRAVENOUS | Status: DC
Start: 2023-10-04 — End: 2023-10-04

## 2023-10-04 MED ORDER — HEPARIN SODIUM 5000 UNIT/ML SQ *I*
5000.0000 [IU] | Freq: Three times a day (TID) | SUBCUTANEOUS | Status: DC
Start: 2023-10-04 — End: 2023-10-10
  Administered 2023-10-04 – 2023-10-09 (×16): 5000 [IU] via SUBCUTANEOUS
  Filled 2023-10-04 (×17): qty 1

## 2023-10-04 MED ORDER — MIDAZOLAM HCL 1 MG/ML IJ SOLN *I* WRAPPED
INTRAMUSCULAR | Status: AC
Start: 2023-10-04 — End: 2023-10-04
  Filled 2023-10-04: qty 2

## 2023-10-04 MED ORDER — SODIUM CHLORIDE 0.9 % FLUSH FOR PUMPS *I*
0.0000 mL/h | INTRAVENOUS | Status: DC | PRN
Start: 2023-10-04 — End: 2023-10-10

## 2023-10-04 MED ORDER — ACETAMINOPHEN 500 MG PO TABS *I*
ORAL_TABLET | ORAL | Status: AC
Start: 2023-10-04 — End: 2023-10-04
  Filled 2023-10-04: qty 2

## 2023-10-04 MED ORDER — NEOMYCIN-POLYMYXIN-DEXAMETH 3.5-10000-0.1 OP OINT *I*
TOPICAL_OINTMENT | OPHTHALMIC | Status: DC | PRN
Start: 2023-10-04 — End: 2023-10-04
  Administered 2023-10-04: 1 [in_us] via TOPICAL

## 2023-10-04 MED ORDER — MIDAZOLAM HCL 1 MG/ML IJ SOLN *I* WRAPPED
INTRAMUSCULAR | Status: DC | PRN
Start: 2023-10-04 — End: 2023-10-04
  Administered 2023-10-04 (×2): 1 mg via INTRAVENOUS

## 2023-10-04 MED ORDER — FENTANYL CITRATE 50 MCG/ML IJ SOLN *WRAPPED*
INTRAMUSCULAR | Status: AC
Start: 2023-10-04 — End: 2023-10-04
  Filled 2023-10-04: qty 2

## 2023-10-04 MED ORDER — LIDOCAINE HCL 1% IJ SOLN *WRAPPED* (FOR LINE PLACEMENT ONLY)
0.1000 mL | INTRAMUSCULAR | Status: DC | PRN
Start: 2023-10-04 — End: 2023-10-10

## 2023-10-04 MED ORDER — ACETAMINOPHEN 500 MG PO TABS *I*
1000.0000 mg | ORAL_TABLET | Freq: Once | ORAL | Status: AC
Start: 2023-10-04 — End: 2023-10-04
  Administered 2023-10-04: 1000 mg via ORAL

## 2023-10-04 MED ORDER — LIDOCAINE-EPINEPHRINE 1 %-1:100000 IJ SOLN *I*
INTRAMUSCULAR | Status: DC | PRN
Start: 2023-10-04 — End: 2023-10-04
  Administered 2023-10-04: 2.5 mL via SUBCUTANEOUS

## 2023-10-04 MED ORDER — DEXTROSE 5 % FLUSH FOR PUMPS *I*
0.0000 mL/h | INTRAVENOUS | Status: DC | PRN
Start: 2023-10-04 — End: 2023-10-10

## 2023-10-04 MED ORDER — SODIUM CHLORIDE 0.9 % IV SOLN WRAPPED *I*
20.0000 mL/h | Status: DC
Start: 2023-10-04 — End: 2023-10-04

## 2023-10-04 MED ORDER — FENTANYL CITRATE 50 MCG/ML IJ SOLN *WRAPPED*
INTRAMUSCULAR | Status: DC | PRN
Start: 2023-10-04 — End: 2023-10-04
  Administered 2023-10-04 (×4): 50 ug via INTRAVENOUS

## 2023-10-04 SURGICAL SUPPLY — 15 items
ADHESIVE MASTISOL 2/3CC VIAL (Dressing) ×1 IMPLANT
BAG ZIPLOCK W/POUCH BIOHAZ 6 X 9 (Supply) ×1 IMPLANT
CLOSURE STERI-STRIP REINF .25 X 3IN LF (Dressing) ×1 IMPLANT
DRAPE SURG APERTURE 15 X 15IN SM (Drape) ×1 IMPLANT
DRESSING TEGADERM W/LABEL 2 3/8 X 2 3/4 (Dressing) ×1 IMPLANT
ELECTRODE ADULT POLYHESIVE PAT RETURN (Supply) ×1 IMPLANT
GLOVE BIOGEL PI MICRO SZ 6.5 (Glove) ×1 IMPLANT
GLOVE SURG BIOGEL PI ULTRATOUCH SZ 6.5 (Glove) ×1 IMPLANT
NEEDLE BOVIE COLORADO (Needle) ×1 IMPLANT
PACK CUSTOM OPTHO PLASTIC (Pack) ×1 IMPLANT
PAD CURAD NONADHERENT 3X8 STER LF (Dressing) ×1 IMPLANT
SUTR PLAIN GUT 6-0 G-1 18 IN (Suture) ×1 IMPLANT
SUTR VICRYL ANTIB 4-0 D 12-18 VIOLET (Suture) ×1 IMPLANT
SUTR VICRYL ANTIB 4-0 PS-2 27 UNDYED (Suture) ×1 IMPLANT
SWABSTICK PVP SOL SGL (Supply) ×2 IMPLANT

## 2023-10-04 NOTE — Anesthesia Case Conclusion (Signed)
CASE CONCLUSION  Emergence  Criteria Used for Airway Removal:  Adequate Tv & RR and acceptable O2 saturation  Transport  Directly to: PACU  Position:  Recumbent  Patient Condition on Handoff  Level of Consciousness:  Alert/talking/calm  Patient Condition:  Stable  Handoff Report to:  RN

## 2023-10-04 NOTE — DOS Update (DOS) (Signed)
 Day of Service   Surgeon/Proceduralist Documentation   for   [name] Blassingame's  Procedure(s) (LRB):  TEMPORAL ARTERY BIOPSY RIGHT EYE (Right)    Documentation related to        History of Present Illness/Indication for Procedure        Review of Systems Specific to Indicated Procedure/Operative Site        Exam Specific to Indicated Procedure/Operative Site        Planned Procedure  can be found in document listed below or in Procedure Pass Linked Documents report.     Provider Consult by Sabogal, Ernesto, MD on 09/30/2023 11:24 AM      By signing this note, I attest that the information documented above is correct, reflecting the current physical state of the patient today, and that I have disclosed to the patient or designated surrogate that I am responsible for performing the procedure, and that it is appropriate to proceed with the planned surgery/procedure.

## 2023-10-04 NOTE — Progress Notes (Signed)
 Nephrology Progress Note   LOS: 4 days     Interval History  No acute events overnight  HD yesterday 1.4L UF   S/p IR for L5 lesion biopsy yesterday. Today, he went for a temporal artery biopsy.     Subjective   Patient seen & examined at bedside for extra HD needs. Chart reviewed for events, lab, and radiology data. He was comfortably asleep, I did not awaken.     Objective      Current Vitals Vitals Range (24 Hours)   BP 139/77 (BP Location: Right leg)   Pulse 89   Temp 36.1 C (97 F) (Temporal)   Resp 18   Ht 1.651 m (5' 5)   Wt 79.8 kg (176 lb)   SpO2 95%   BMI 29.29 kg/m  BP: (78-139)/(40-77)   Temp:  [36 C (96.8 F)-37.4 C (99.3 F)]   Temp src: Temporal (07/25 1214)  Heart Rate:  [73-90]   Resp:  [15-22]   SpO2:  [92 %-98 %]      I/Os Weight   I/O last 3 completed shifts:  07/24 0700 - 07/25 0659  In: 400 (5 mL/kg) [I.V.:400 (0.2 mL/kg/hr)]  Out: 1772 (22.2 mL/kg) [Other:1772]  Net: -1372  Weight: 79.8 kg  Last 4 Weights    09/29/23 0031 09/29/23 0041 10/01/23 0810 10/01/23 2003   Weight: 80.1 kg (176 lb 9.4 oz) 80.1 kg (176 lb 9.4 oz) 79.9 kg (176 lb 2.4 oz) 79.8 kg (176 lb)        Physical Exam:  Constitutional:  General: Not in acute distress, laying in bed, asleep  HENT:  Head: Normocephalic and atraumatic  Nose: Nose normal  CVS:   Rate and Rhythm: Normal rate and rhythm  Pulmonary:   Effort: No respiratory distress on room air   MSK: Warm and well perfused  Neuro:   General: No focal deficit noted  Mental status: Alert and oriented x3  Psych:   Mood and Affect: Mood normal.      Behavior: Behavior normal.   Access: HDTC, dressing CDI    Recent Labs   Lab 10/04/23  0300 10/03/23  0711 10/02/23  1751 10/02/23  0954 10/02/23  0611   Sodium 132* 132* 133   < > 131*   Potassium 4.6 5.2* 5.4*   < > 6.3*   Chloride 94* 94* 94*   < > 94*   CO2 18* 21 21   < > 21   Anion Gap 20* 17* 18*   < > 16   UN 39* 56* 50*   < > 41*   Creatinine 8.00* 10.50* 10.10*   < > 8.73*   Glucose 77 77 74   < > 75    Calcium  8.8 8.9 8.4*   < > 8.8   Albumin 3.1* 3.1*  --   --  3.2*   Phosphorus 4.1 3.8  --   --  4.6*   Magnesium  1.8 1.9  --   --  1.8    < > = values in this interval not displayed.     No results found for: BKVQL  No results found for: CMVQL  Aerobic bacterial pleural or thoracentesis fluid culture and stain   Date Value Ref Range Status   10/01/2023 Lab Cancel  Final     Bacterial Blood Culture   Date Value Ref Range Status   10/01/2023 .  Preliminary   10/01/2023 .  Preliminary  Recent Labs   Lab 10/04/23  0300 10/03/23  0711 10/02/23  0611   WBC 13.5* 11.6* 15.3*   Hemoglobin 9.7* 9.4* 9.7*   Hematocrit 33* 31* 34*   Platelets 449 481* 451*         Component Value Date/Time    FER 650 (H) 09/29/2023 0327     No results for input(s): TACRR, SIR, CSAM in the last 168 hours.   No results for input(s): JOLINDA PLEVA, [place], [place], [place], [place], UOSMO in the last 168 hours.  No results for input(s): UAPP, UCOL, UAGLU, KETONESU, USG, UBLD, UAPH, UPRO, UNITR, ULEU, URBC, UWBC, UMUC, [place], [place], SPECIFICGRAV, BLOODUA, PHUA, PROTEIN, NITRITEUA, LEUKESTERASE, GLUCOSESIE, KETONESIEM, PUSG, BLOODSIEMEN, PUAPH, PROTEINUA, PUNIT, LEUKOCYTESIE in the last 8760 hours.           IR bone biopsy  Result Date: 10/04/2023  CT-guided biopsy of L5 vertebral body lesion. Plan: Specimen(s) sent for evaluation. END OF IMPRESSION I was present for the critical and key portions of the procedure or visit and was immediately available in the suite to assist. I have personally reviewed the images and the Resident's/Fellow's interpretation and agree with or edited the findings.     MR spine thoracic without and with contrast  Result Date: 10/04/2023  Ill-defined enhancement with T2/STIR hyperintensity in the T6 vertebral body, suspicious for osseous metastasis. A smaller lesion is also seen within the T12 vertebral body. Several enhancing soft tissue  lesions in the thoracic paraspinal musculature, likely representing metastases. 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 Partially visualized expansile enhancing lesion in the C7 vertebra which appears to extend to the left anterolateral epidural space and neural foramen. Appearance is similar to prior MR C-spine. Bilateral pleural effusions with associated pleural thickening. END OF IMPRESSION I have personally reviewed the images and the Resident's/Fellow's interpretation and agree with or edited the findings.     MR lumbar spine without and with contrast  Result Date: 10/04/2023  Multiple enhancing osseous lesions throughout the lumbar spine, sacrum, and visualized portions of the iliac bones, compatible with metastatic disease. Several enhancing soft tissue lesions in the bilateral psoas and paraspinal musculature, likely also representing metastases. There is a background of abnormal low T1 bone marrow signal which may relate to underlying renal osteodystrophy. Multiple abnormal appearing retroperitoneal lymph nodes. Please also see separately dictated CT abdomen pelvis for additional details. END OF IMPRESSION I have personally reviewed the images and the Resident's/Fellow's interpretation and agree with or edited the findings.     * Femur LEFT standard AP and Lateral  Result Date: 10/02/2023  Patchy mixed radiolucency and sclerosis of the proximal femur and patchy sclerosis of the ischium. No pathologic fracture seen. END OF IMPRESSION     * Femur RIGHT standard AP and Lateral  Result Date: 10/02/2023  Patchy decreased bone mineralization of the proximal femur and patchy sclerosis of the ischium. No pathologic fracture perceived. END OF IMPRESSION     CT abdomen and pelvis with IV contrast  Result Date: 10/01/2023  Nodal and extensive intramuscular lesions as described, likely metastases. Right adrenal lesion is indeterminate but also suspicious for  metastatic disease given context. No clear primary malignancy identified in the abdomen or pelvis. END OF IMPRESSION     Chest single frontal view  Result Date: 10/01/2023  Trace right effusion remaining. No evidence of pneumothorax. Small left effusion unchanged. END OF IMPRESSION        Current Medications  Scheduled Meds:  heparin  (porcine)  5,000 units Subcutaneous TID    ceFEPime  (MAXIPIME ) IV  1,000 mg Intravenous Q24H    metroNIDAZOLE   500 mg Oral 3 times per day    polyethylene glycol  17 g Oral Daily    patiromer   16.8 g Oral Daily @ 1400    acetaminophen   1,000 mg Oral TID    magnesium  oxide  400 mg Oral Daily    atorvastatin   20 mg Oral Daily with dinner    B complex-vitamin C-folic acid   1 tablet Oral Daily    calcium -vitamin D   1 tablet Oral BID    cinacalcet   30 mg Oral Daily    fludrocortisone   0.1 mg Oral TID    fluticasone   1 spray Nasal Daily    midodrine   15 mg Oral TID    omeprazole   40 mg Oral QAM    Lidocaine   2 patch Transdermal Q24H     Continuous Infusions:   Vancomycin  - Pharmacist to Dose       PRN Meds:sodium chloride , dextrose , lidocaine  HCL, oxyCODONE , heparin  (porcine), heparin  (porcine), sodium chloride , dextrose         Assessment  Shane Pearson is a 63 y.o. Male with history significant for ESRD (on HD TTS), hypotension, and lumbosacral spondylosis who presented to the ED for pain and difficulty walking. Workup concerning for malignancy, considering mediastinal lymphadenopathy. Nephrology consulted for dialysis needs.     Plan    ESRD 2/2 HTN on HD TTS at [facility]:  Dose adjust medications to CrCl <10 mL/s  Continue nephrovite    No urgent indications for extra dialysis today based on labs and volume status. Next treatment tomorrow per schedule.     Dialysis access:   HDTC    Hyperkalemia/hyponatremia   Potassium WNL  Mild hyponatremia, monitor   - Continue low K diet  - Would start V[place]  16.8g daily     CKD-BMD:  Calcium  Level: 8.8  PO4 Level: 4.1 WNL  - Calcimimetic: sensipar  30 mg daily      Acid-Base Disorders:   Controlled with HD   Will keep within desired goal range (serum bicarb 22-25) with RRT against 35 bicarb    Hemodynamics:   SBP range: 90-130's  Continue antihypertensive meds: midodrine  15 mg TID with meals   Will remove fluid with scheduled HD treatments    Anemia:   Hgb as above  No ESA indicated at this time  Hemoglobin goal (chronically) 10-11.5 gm/dl  Transfusion of PRBC as needed per primary team    Remainder of management per primary team.  Thank you for allowing us  to participate in the care of this patient.    Ronal Critchley, NP  Nephrology Division  [facility]  Pager [phone]  10/04/2023 2:39 PM

## 2023-10-04 NOTE — Anesthesia Procedure Notes (Signed)
---------------------------------------------------------------------------------------------------------------------------------------    AIRWAY   GENERAL INFORMATION AND STAFF    Patient location during procedure: OR       Date of Procedure: 10/04/2023 8:45 AM  CONDITION PRIOR TO MANIPULATION     Current Airway/Neck Condition:  Normal        For more airway physical exam details, see Anesthesia PreOp Evaluation  AIRWAY METHOD     Patient Position:  Sniffing    Preoxygenated: yes      Maintained In-Line Stability: not needed, normal c-spine condition          To see details of medications used, see MAR    Induction: IV and Routine    Mask Difficulty Assessment:  0 - not attempted    Number of Attempts at Approach:  1    Number of Other Approaches Attempted:  0  FINAL AIRWAY DETAILS    Final Airway Type:  Nasal cannula    Head position required to avoid obstruction:  Turned to left    Insertion Site:  Left naris and right naris  ----------------------------------------------------------------------------------------------------------------------------------------

## 2023-10-04 NOTE — Progress Notes (Addendum)
 Hospital Medicine Service Attending Progress Note    Significant Events/Subjective:   No acute events overnight.  Had HD yesterday 7/24.  Had IR biopsy of L5 vertebral body 7/24 with subsequent initiation of vank/cefepime /Flagyl  in the setting of patient having a penicillin allergy and NSGY concern for cervical osteo.     He has not utilized oxycodone  within the last 24 hours, continues on Tylenol .  Vitals remained stable.  Pleural fluid Gram stain's still negative with no growth to date on cultures, though 10-25 nucleated white cells seen. Aerobic pleural culture cancelled by lab and anaerobic culture pending.     Evaluated on rounds.  Is overall feeling okay-just returned from right temporal artery biopsy.  Has an improved appetite, stooling, and less pain overall.  Patient's wife Shane Pearson was on the phone during evaluation/encounter.  All questions answered/concerns discussed.  Denies chest pain, shortness of breath, nausea/vomiting/abdominal pain currently.      Objective:     Physical Exam  Temp:  [36 C (96.8 F)-36.9 C (98.4 F)] 36.7 C (98.1 F)  Heart Rate:  [73-97] 73  Resp:  [16-28] 16  BP: (78-152)/(40-81) 118/55       Constitutional: Laying in bed, talkative, no acute distress, alert  Head: Right temporal artery bandage  Neck: Supple   ENT: Dry oral mucosa, nares patent  CV: Regular rate, regular rhythm, no murmur/rub/gallops, no pedal edema  Resp: Normal work of breathing on room air, continue to see improved aeration bilaterally without adventitious lung sounds  GI: Abdomen soft, nontender, somewhat distended, no organomegaly  GU: No Foley  MSK: Able to move all extremities spontaneously, normal bulk  Skin: Warm, dry, intact  Neuro: AO x 4  Psych: Good insight, fair mood, reasonable    Recent Lab Studies:  Personally reviewed and notable for:  Recent Labs   Lab 10/04/23  0300 10/03/23  0711 10/02/23  0611   WBC 13.5* 11.6* 15.3*   Hemoglobin 9.7* 9.4* 9.7*   Hematocrit 33* 31* 34*   Platelets 449  481* 451*      Recent Labs   Lab 10/04/23  0300 10/03/23  0711 10/02/23  1751 10/02/23  0954 10/02/23  0611   Sodium 132* 132* 133   < > 131*   Potassium 4.6 5.2* 5.4*   < > 6.3*   Chloride 94* 94* 94*   < > 94*   CO2 18* 21 21   < > 21   UN 39* 56* 50*   < > 41*   Creatinine 8.00* 10.50* 10.10*   < > 8.73*   Glucose 77 77 74   < > 75   Calcium  8.8 8.9 8.4*   < > 8.8   Albumin 3.1* 3.1*  --   --  3.2*   Phosphorus 4.1 3.8  --   --  4.6*    < > = values in this interval not displayed.     Recent Labs   Lab 09/30/23  1943   INR 1.4*   Protime 15.7*       Recent Imaging Studies:  MR spine thoracic without and with contrast  Result Date: 10/04/2023  Ill-defined enhancement with T2/STIR hyperintensity in the T6 vertebral body, suspicious for osseous metastasis. A smaller lesion is also seen within the T12 vertebral body. Several enhancing soft tissue lesions in the thoracic paraspinal musculature, likely representing metastases. 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 Partially visualized expansile enhancing lesion in the C7 vertebra  which appears to extend to the left anterolateral epidural space and neural foramen. Appearance is similar to prior MR C-spine. Bilateral pleural effusions with associated pleural thickening. END OF IMPRESSION I have personally reviewed the images and the Resident's/Fellow's interpretation and agree with or edited the findings.     MR lumbar spine without and with contrast  Result Date: 10/04/2023  Multiple enhancing osseous lesions throughout the lumbar spine, sacrum, and visualized portions of the iliac bones, compatible with metastatic disease. Several enhancing soft tissue lesions in the bilateral psoas and paraspinal musculature, likely also representing metastases. There is a background of abnormal low T1 bone marrow signal which may relate to underlying renal osteodystrophy. Multiple abnormal appearing retroperitoneal lymph  nodes. Please also see separately dictated CT abdomen pelvis for additional details. END OF IMPRESSION I have personally reviewed the images and the Resident's/Fellow's interpretation and agree with or edited the findings.     IR bone biopsy  Result Date: 10/03/2023  CT-guided biopsy of L5 vertebral body lesion. Plan: Specimen(s) sent for evaluation. END OF IMPRESSION    * Femur LEFT standard AP and Lateral  Result Date: 10/02/2023  Patchy mixed radiolucency and sclerosis of the proximal femur and patchy sclerosis of the ischium. No pathologic fracture seen. END OF IMPRESSION     * Femur RIGHT standard AP and Lateral  Result Date: 10/02/2023  Patchy decreased bone mineralization of the proximal femur and patchy sclerosis of the ischium. No pathologic fracture perceived. END OF IMPRESSION     CT abdomen and pelvis with IV contrast  Result Date: 10/01/2023  Nodal and extensive intramuscular lesions as described, likely metastases. Right adrenal lesion is indeterminate but also suspicious for metastatic disease given context. No clear primary malignancy identified in the abdomen or pelvis. END OF IMPRESSION     Chest single frontal view  Result Date: 10/01/2023  Trace right effusion remaining. No evidence of pneumothorax. Small left effusion unchanged. END OF IMPRESSION       Assessment:     Shane Pearson is a  63 y.o. male with ESRD (on HD TTS), hypotension, and lumbosacral spondylosis who presented to the ED for chest pain and left shoulder pain. Initial work up was notable for mild leukocytosis, chronic anemia, thrombocytosis, elevated NT-pro BNP, elevated but adynamic troponin, elevated AST and Alk Phos, and ESRD.  Initial chest x-ray demonstrating right sided opacity, concern for atelectasis and minimal right pleural effusion.  He was admitted for constitutional symptoms and workup concerning for occult infectious process, rheumatologic disorder or malignancy.  Further workup since admission is highly suggestible for  metastatic disease of unclear primary with multiple cervical/thoracic/lumbar spine lesions with extension into paraspinal soft tissues, bilateral psoas lesions,  multiple nodal/intramuscular lesions on CT A/P all likely metastatic lesions. Further, has b/l exudative pleural effusions s/p tap (pending cytology).  Currently, patient is clinically stable, pending further diagnostic results.      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  This was discussed with patient.    -Labs on admission: ESR 121, CRP 338, PSA 0.22, AFP negative, CEA 5.2, CA 19-9 19, ANA 1: 80, CK 67, LDH 798. Uric acid 7.4 for TLS evaluation (no allopurinol or rasburicase indicated)  -Blood cultures 7/22 2300: Negative to date  -Neurosurgery signed off-will call back if concern for cervical osteo persists and when the biopsy path returns, appreciate ongoing recommendations.  Continue vanc/cefepime /flagyl  for broad spectrum coverage s/p IR  biopsy in case of cervical osteo-will continue until pathology results return  - Heme/onc following, appreciate ongoing recommendations.  For suspected malignancy of unclear primary; if PET scan inpatient possible? Will consider thyroid biopsy if spine pathology and pleural fluid is non-diagnostic  - Follow up Rad Onc consult  - Follow up MRI brain, ordered and pending   -Pain control: 1000 mg Tylenol  scheduled 3 times daily, breakthrough oral oxycodone  as needed for bone pain.  Can progress to IV dilaudid  as needed (ESRD)    Large right exudative pleural effusion s/p thoracentesis  Moderate left pleural effusion  -Appears layered on CT  -No growth on Gram stain  -Pending cytology, culture (no growth on anaerobic culture yet, aerobic culture was canceled by lab)    Vertical diplopia  Concern for GCA, s/p temporal artery biopsy (7/25)  -Ophthalmology consulted and following,  planning for biopsy 7/25 for GCA  -Wonder if there could be infiltration of cranial nerves and if additional brain imaging with MRI would be warranted considering acute onset and setting of potential malignancy.  No obvious tumor seen on CT head  -ESR/CRP trend every 3 days -ESR significantly unchanged since admission, CRP improving    Moderate refeeding syndrome risk  -BMP, mag, Phos every day x 3 days then reevaluate (7/24 - 7/26)  - No repletion's required thus far    Isolated alkaline phosphatase elevation  Could be from bone  -GGT ordered    Mild anion gap metabolic acidosis  Hyponatremia  Hypochloremia  Uremia  Hyperkaleima s/p temporizer, resolved  ESRD with HD (T/TH/Sat)  -Reassess after hemodialysis  - Nephrology following closely   - BMP every AM     Normocytic anemia  Likely secondary to ESRD  -Nephrology and holding Epogen    Subacute diarrhea  -Stool studies negative     Additional concerns-  -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 If for primary prevention, will need to discontinue permanently. Will assess further with patient - per prior doc, does not appear to have hx of CVA/MI.  Currently being held in preparation for IR biopsy and GCA biopsy  Allergic rhinitis - Continue flonase     DVT PPx: Heparin  subcu    Emergency Contact: Shane Pearson (spouse/HCP) [phone]   Code Status: Full code    Discharge Planning:   MRDD: Unclear  Discharge Criteria/Barriers to Discharge: Further workup of potential malignancy including biopsy/outpatient connection, rule out cervical osteo  PT and/or  OT Recommendations/Discharge to: PT recommending living facility.  OT recommending ongoing support for ADLs.    Appointments needed with: PCP, heme-onc, neurosurg, ophthalmology    Case discussed with HMD APP, neurosurgery, heme-onc, interdisciplinary team, Optho, patient and wife during rounds    I personally spent 50 minutes on the calendar day of the encounter, including pre and post visit work.      Shane Iglesia, MD on 10/04/2023 at 9:07 AM

## 2023-10-04 NOTE — Provider Consult (Addendum)
 Radiation Oncology CONSULTATION (10/04/2023)    Patient Identifier:  Shane Pearson is a 63 y.o. male who was recently found to have diffusely metastatic disease with unknown primary after presenting to the hospital on 09/28/23.     Diagnosis (including stage):   Cancer Staging   No matching staging information was found for the patient.      Past medical history significant for CHF, ESRD on HD TTS, and osteoarthritis of the L shoulder     Oncologic History:   Oncology History    No history exists.       Radiation Considerations:   Pacemaker/defibrillator: no  Difficulty lay flat on the back: no  Previous radiation: no  Diagnosis of connective tissue disorder: no      History of Present Illness:  Shane Pearson presented to the hospital on 09/28/23 with shooting L shoulder pain x8 weeks, inability to ambulate x1 week 2/2 R leg pain, myalgias, anorexia, fatigue, unintentional weight loss, night sweats x 6 months.     CT c spine showed an asymmetric patchy lucency in C7 extending to the transverse process. Shane showed an expansile enhancing lesion in C7 vertebral body with additional soft tissue components. This is suspicious for metastatic disease. Further imaging obtained included CT abdomen pelvis, CT chest, Shane L spine, and Shane T spine which showed diffuse metastatic disease. CT head was without evidence of intracranial disease.    Radiation oncology was consulted to discuss possible palliative RT to the C7 lesion.     He reports initially experiencing R shoulder pain 6 months ago that resolved. He then developed L shoulder pain about 8 weeks ago. He reports that the pain is intermittent, positional, severe, and shoots down to his hand. He also endorses some weakness in the arm for the past week. He states that he has been able to use that arm less due to the pain, but since being in the hospital he has been trying to use it more and has noted some improvement in functionality and pain. He reports that none of the  medications he has been on have helped the pain.      Problem List  Patient Active Problem List   Diagnosis Code    ESRD (end stage renal disease) on dialysis N18.6, Z99.2    HLD (hyperlipidemia) E78.5    Hypotension I95.9    Ischemic colitis K55.9    Allergic rhinitis J30.9    Arteriosclerosis of artery of extremity I70.209    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    Embolism due to vascular prosthetic devices, implants and grafts, initial encounter T82.818A    Spondylosis of lumbosacral region without myelopathy or radiculopathy M47.817    Diarrhea R19.7    Failure to thrive in adult R62.7       Past Medical History  Past Medical History[1]    Past Surgical History  Past Surgical History[2]    Medications  Current Facility-Administered Medications   Medication Dose Route Frequency    sodium chloride  0.9 % FLUSH REQUIRED IF PATIENT HAS IV  0-500 mL/hr Intravenous PRN    dextrose  5 % FLUSH REQUIRED IF PATIENT HAS IV  0-500 mL/hr Intravenous PRN    lidocaine  HCL 1 % injection 0.1 mL  0.1 mL Subcutaneous PRN    heparin  (porcine) SQ injection 5,000 units  5,000 units Subcutaneous TID    Vancomycin  - Pharmacist to Dose (admitted patients only)   Intravenous Pharmacist Managed Medication    ceFEPIme  (MAXIPIME ) 1,000  mg in dextrose  5% 58 mL IVPB  1,000 mg Intravenous Q24H    metroNIDAZOLE  (FLAGYL ) tablet 500 mg  500 mg Oral 3 times per day    polyethylene glycol (GLYCOLAX ,MIRALAX ) powder 17 g  17 g Oral Daily    patiromer  (VELTASSA ) packet 16.8 g  16.8 g Oral Daily @ 1400    acetaminophen  (TYLENOL ) tablet 1,000 mg  1,000 mg Oral TID    oxyCODONE  (ROXICODONE ) immediate release tablet 5 mg  5 mg Oral Q6H PRN    magnesium  oxide (MAG-OX) tablet 400 mg  400 mg Oral Daily    heparin  (porcine) 1,000 units/mL injection 0-5,000 units  0-5 mL Intracatheter PRN    heparin  (porcine) 1,000 units/mL injection 0-5,000 units  0-5 mL Intracatheter PRN    sodium chloride  0.9 % FLUSH REQUIRED IF PATIENT HAS IV  0-500  mL/hr Intravenous PRN    dextrose  5 % FLUSH REQUIRED IF PATIENT HAS IV  0-500 mL/hr Intravenous PRN    atorvastatin  (LIPITOR) tablet 20 mg  20 mg Oral Daily with dinner    B complex-vitamin C-folic acid  (NEPHRO-VITE) tablet 1 tablet  1 tablet Oral Daily    calcium -vitamin D  (OSCAL-500) 500mg -200unit per tablet 1 tablet  1 tablet Oral BID    cinacalcet  (SENSIPAR ) tablet 30 mg  30 mg Oral Daily    fludrocortisone  (FLORINEF ) tablet 0.1 mg  0.1 mg Oral TID    fluticasone  (FLONASE ) 50 MCG/ACT nasal spray 1 spray  1 spray Nasal Daily    midodrine  (PROAMATINE ) tablet 15 mg  15 mg Oral TID    omeprazole  (PriLOSEC) capsule 40 mg  40 mg Oral QAM    Lidocaine  4 % patch 2 patch  2 patch Transdermal Q24H       Allergies  Allergies[3]    Family History  Family History[4]    Social History  Social History[5]      Review of Systems: As per history of present illness.      Physical Examination  BP 139/77 (BP Location: Right leg)   Pulse 89   Temp 36.1 C (97 F) (Temporal)   Resp 18   Ht 165.1 cm (5' 5)   Wt 79.8 kg (176 lb)   SpO2 95%   BMI 29.29 kg/m     There were no vitals filed for this visit.    KPS:  Score-50- Requires considerable assistance and frequent medical care.    Physical Exam  Constitutional:       General: He is not in acute distress.  HENT:      Head: Normocephalic and atraumatic.   Eyes:      General: No scleral icterus.        Right eye: No discharge.         Left eye: No discharge.   Cardiovascular:      Rate and Rhythm: Normal rate.   Pulmonary:      Effort: Pulmonary effort is normal. No respiratory distress.   Abdominal:      General: Abdomen is flat. There is no distension.   Neurological:      Mental Status: He is alert.   Psychiatric:         Mood and Affect: Mood normal.              Diagnostic and Laboratory Data Reviewed:  See subjective above. Both radiologic images and reports were personally reviewed and discussed with [name] and family.      Shane C spine 10/01/23:  Decreased  sensitivity/specificity secondary to patient motion and poor signal on several sequences.      Expansile enhancing lesion within the C7 vertebral body with soft tissue components extending into the prevertebral space and the left anterolateral epidural space. Additionally, there is suspected soft tissue component extending into the left C6-C7 and   C7-T1 neuroforamen which causes severe left foraminal narrowing at C7-T1 and moderate to severe left foraminal narrowing at C6-C7. Associated trace fluid within the prevertebral space. Overall, findings are suspicious for osseous metastatic disease.      Additional enhancing lesions within the left suboccipital musculature at the level of C4. Possible additional subcentimeter lesions within the left paraspinal musculature at the level of C3-C4 and the left posterior neck base. These are suspicious for   additional metastatic lesions.      Borderline enlarged right level 2 lymph node with slightly rounded morphology. Attention on follow-up imaging is recommended.      Prominent STIR hyperintensity along the anteroinferior endplate of C4 with ill-defined enhancement, favoring degenerative changes. Attention on follow-up imaging is recommended.      Partially visualized moderate to large pleural effusions.      END OF IMPRESSION           IMPRESSION  Shane Pearson is a 63 y.o. male who was diagnosed with diffusely metastatic disease of unknown primary. He presented with shooting L shoulder pain, hip pain, and B symptoms.       DISCUSSION  After interviewing and examining the patient we enjoyed a 60-minute face-to-face discussion with him and his wife, 95% of which was for counseling and coordination of care. We carefully reviewed the events leading up to the diagnosis as well as the management to date, radiographic findings and the pathologic data.     We discussed symptoms and work up completed to date. He continues to endorse intermittent, severe L shoulder pain. It is  possible that this pain is caused by the C7 lesion causing nerve compression, but not certain. We discussed that the goal of RT would be to try to alleviate some of that pain. We discussed side effects, risks, and benefits of RT to this area and all questions were answered.     Since we do not have a definitive diagnosis yet, he would like to wait for bx results before proceeding with treatment, which is reasonable. Patient was counseled that if his pain worsens or if he decides that he cannot tolerate it any more, then we can proceed with treatment earlier. He expressed understanding. All questions were answered.    Plan to wait for bx results before proceeding with tx. Patient can contact us  if would like to proceed with treatment earlier. Would plan for 5-10 fx. Would need to plan around HD TTS schedule.        Earlie Molt, MD  PGY-2 Resident  Radiation Oncology  10/04/2023    The patient was also seen and evaluated by Dr. Virgia. Formal attending attestation to follow.     I saw and evaluated the patient. I agree with the resident's/fellow's findings and plan of care as documented.    The patient is currently being worked up for what appears to widespread malignancy - with biopsy results pending.     We discussed that his left shoulder pain may be attributable to nerve root compression at C7.           He also has proximal left upper/medial thigh pain w/o tenderness. There are bony  changes seen on CT and plain x-ray that may account for this pain.     The extent of intramuscular lesions (in terms of number and scattered appearance) is unusual for malignancies though certainly possible. Some cancers may be very chemo-sensitive (lymphomas, GCTs or neuroendocrine cancers). I would not expect lymphoma or GCTs to behave like this.     We mutually agreed to wait for the biopsy results and consider coordinating palliative radiation - which could possibly be delivered at the [place] location.    The rationale,  risks, and benefits of radiation therapy were discussed with the patient in broad terms (since we do not have a firm plan).  We plan to keep tabs on him while admitted.     The patient is free to contact us  with any questions or concerns.      OZELL ONEIDA DANDY, MD,PhD              [1]   Past Medical History:  Diagnosis Date    Arthritis     CHF (congestive heart failure)     Colon polyp     Dialysis patient     ESRD (end stage renal disease)     GERD (gastroesophageal reflux disease)     Hypercholesterolemia     Hypotension     Ischemic colitis     Thyroid disease     hypo   [2]   Past Surgical History:  Procedure Laterality Date    DIALYSIS FISTULA CREATION Bilateral     4 fistulas have failed- ? d/t low BP and small veins    HERNIA REPAIR      Right inguinal hernia repair     [3]   Allergies  Allergen Reactions    Penicillins Swelling     Face    Coconut Flavoring Agent (Non-Screening) Nausea And Vomiting     All sources coconut (including raw) --causes regurgitation     Amoxicillin Rash    Baclofen Other (See Comments)     confusion    No Known Latex Allergy    [4]   Family History  Problem Relation Name Age of Onset    Diabetes Mother      Asthma Mother      Coronary artery disease Father     [5]   Social History  Socioeconomic History    Marital status: Married   Tobacco Use    Smoking status: Former     Packs/day: 0.30     Years: 15.00     Additional pack years: 0.00     Total pack years: 4.50     Types: Cigarettes     Quit date: 08/02/1992     Years since quitting: 31.1    Smokeless tobacco: Never   Substance and Sexual Activity    Alcohol use: No    Drug use: No

## 2023-10-04 NOTE — Anesthesia Postprocedure Evaluation (Signed)
 Anesthesia Post-Op Note    Patient: Shane Pearson    Procedure(s) Performed:  Procedure Summary  Date:  10/04/2023 Anesthesia Start: 10/04/2023  8:31 AM Anesthesia Stop: 10/04/2023  9:19 AM Room / Location:  [id number] / [facility] MAIN OR   Procedure(s):  TEMPORAL ARTERY BIOPSY RIGHT EYE Diagnosis:  Giant cell arteritis [M31.6] Surgeon(s):  Lynn Ervin Edelman, MD  Gerlean Newton, MD Responsible Anesthesia Provider:  THEODIS SHARPER, MD         Recovery Vitals  BP: 102/67 (10/04/2023  9:30 AM)  Heart Rate: 83 (10/04/2023  9:30 AM)  Heart Rate (via Pulse Ox): 87 (10/04/2023  9:30 AM)  Resp: 16 (10/04/2023  9:30 AM)  Temp: 37 C (98.6 F) (10/04/2023  9:30 AM)  SpO2: 95 % (10/04/2023  9:30 AM)  O2 Flow Rate: 0 L/min (10/03/2023  3:30 PM)  0-10  Pain Scale: 0 (10/04/2023  9:30 AM)    Anesthesia type:  MAC  Complications Noted During Procedure or in PACU:  None   Comment:    Patient Location:  PACU  Level of Consciousness:    Recovered to baseline  Patient Participation:     Able to participate  Temperature Status:    Normothermic  Oxygen Saturation:    Within patient's normal range  Cardiac Status:   within patient's normal range  Fluid Status:    Stable  Airway Patency:     Yes  Pulmonary Status:    Baseline  Pain Management:    Adequate analgesia  Nausea and Vomiting:  None    Post Op Assessment:    Tolerated procedure well  Responsible Anesthesia Provider Attestation:  All indicated post anesthesia care provided       -

## 2023-10-04 NOTE — Progress Notes (Cosign Needed)
 Report Given To  [facility] RN      Descriptive Sentence / Reason for Admission   Team:    Presented with:intermittent (L) shoulder pain x 2 weeks. Inability to ambulate x 1 week r/t (R) leg pain. + muscle aches + fatigue + weight loss + night sweats + poor PO intake  Relevant PMHx:ESRD HD (T,Th,S) Osteoarthritis, Hypotension  Ik:ipjmmyzj           Active Issues / Relevant Events   Shift Event (last 24 hours):  7/23 D: Labs sent, soft BP throughout shift provider aware. Worked with PT, OOB to chair, potassium trended down currently 5.0. C/o blurred vision to provider, NPO at midnight possible biopsy tomorrow. More PT work up for discharge.   7/23 N BP on soft side, midodrine  and florinef  given. Tele discontinued by provider Brad prior to MRI. Patient trasported to MRI at [facility] and after MRI will transport directly to hemodialysis. Morning labs will be collected by HD nurse Mont Bench RN. Provider Brad Broody PA is aware and will reorder tele once the patient in hemodialysis.  7/24 D12: MRI, then HD only dialyzed for 3 hours, IR lumbar biopsy completed today, however IR called and not all needed specimens collected. Pt to go to IR for another spine biopsy 7/25.   7/25 D: Biopsy complete. VS remain stable throughout day. Full bath done by pt's wife. NPO resumes @ midnight.   Precautions:        AOX:X4  Mobility: Cane  Pills: Whole  Meals: NPO   Continent: (yes/no)  IV:  g22 right hand   (drsg due__)   (Line cap change due__)  IVF/gtts:     Drains/tubes/O2/Bipap/CPAP:     Patient Behaviors:Calm           To Do List  VS:Q4  Tele: yes  Sticker Change Daily []   Wound Care:  Labs/Tests: ESRD, dialysis T/Th/Sat (received dialysis 07/19)   BGs:                                                                             Daily Weights: no                                            Daily Bath (soap & water /CHG): ___    Restraints: (no)  Violent or nonviolent  When restraints order renewal needed:___         Wandalee CHRISTELLA Free, LPN

## 2023-10-04 NOTE — Op Note (Signed)
 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 Anticipated Removal Date:.        Packing:                 Patient Condition: good       Indications: Rule out GCA       Findings (Including unexpected complications): As expected     Description of Procedure:                       SURGEON:  Edelman Lynn, MD  CO-SURGEON:  Selby Gerlean, MD/    PREOPERATIVE DIAGNOSIS:  GCA suspect     POSTOPERATIVE DIAGNOSIS:  GCA suspect     OPERATIVE PROCEDURE:  Temporal artery biopsy, right.     ANESTHESIA:  TIVA.     COMPLICATIONS:  None.     ESTIMATED BLOOD LOSS:  2 mL.     SPECIMEN:  right temporal artery segment approximately 2 cm in vivo.     INDICATIONS: concern for giant cell arteritis, need of pathology confirmation.     DESCRIPTION OF PROCEDURE:  The patient was informed of all risks, benefits and alternatives, and elected to have surgery.  On the day of surgery, the patient was brought to the operating room where a time-out was taken to identify the patient, operative site, operative  procedure, and all drug allergies.  Sedation was provided.  Local was injected.  The patient was prepped and draped in the usual sterile manner for surgery on the face.       Electrocautery was used to make an incision through skin, and dissection was carried down to the vessel.  This was ligated proximally and distally with 4-0 vycril, excised and submitted to Pathology, 6-0 plain was used to close superficially.      The patient's face was cleaned.  Antibiotic ointment was applied. Steri-strips were used to cover the incision.     The patient tolerated the procedure well and went to Recovery.         Signed:  Edelman Lynn Ervin, MD  on 10/04/2023 at 9:14 AM

## 2023-10-04 NOTE — Progress Notes (Signed)
 Neurosurgery Progress Note    24hr events & Subjective:   Hospital Day: 4  POD: * No surgery found *   Significant events:    S/p biopsy     Objective:   Examination:   GEN: No apparent distress  Mental Status: Awake and oriented x 3   Cranial Nerves: PERRL, face symmetric, tongue midline  Motor Exam: Exam pain limited (L>R upper and lower extremity pain with movement). 4/5 shoulder abduction bilaterally. 4/5 L bicep/tricep, Otherwise 5/5 throughout.  Sensory Exam: Sensation intact to light touch throughout b/l upper and lower extremities.    Assessment/ Plan:     Assessment: 63 y.o. male w/ malignant vs infectious process. S/p biopsy.    Plan:   Follow biopsy results  Abx per primary/ID  Neurosurgery will sign off  DVT prophylaxis: SCDs, SQH 24 hours post op    Please page the on call Neurosurgery 1st call resident for any questions regarding this patient's care.  Signed by DENNISE SLEDGE, MD on 10/04/2023 at 6:40 AM    Note created using Dragon Voice-to-Text software. Typographical and grammatical errors may exist despite proofreading

## 2023-10-04 NOTE — Progress Notes (Addendum)
 Report Given To    Wandalee Free LPN    Descriptive Sentence / Reason for Admission   Team:    Presented with:intermittent (L) shoulder pain x 2 weeks. Inability to ambulate x 1 week r/t (R) leg pain. + muscle aches + fatigue + weight loss + night sweats + poor PO intake  Relevant PMHx:ESRD HD (T,Th,S) Osteoarthritis, Hypotension  Ik:ipjmmyzj           Active Issues / Relevant Events   Shift Event (last 24 hours):  7/24 N NPO since 0001 (7/25).  Refused SCD.  Provider Adolm Pinal PA agreed to check patient's blood pressure in leg. Patient is asymptomatic, no headache /lightheadedness. Patient's smartphone given to dayshift charge nurse Clotilda Splinter RN. Patient transported to Triage room for Biopsy procedure.  AOX:X4  Mobility: Cane  Pills: Whole  Meals: NPO   Continent: (yes/no)  IV:  g22 right hand   (drsg due__)   (Line cap change due__)  IVF/gtts:     Drains/tubes/O2/Bipap/CPAP:     Patient Behaviors:Calm           To Do List  VS:Q4  Tele: yes  Sticker Change Daily []   Wound Care:  Labs/Tests: ESRD, dialysis T/Th/Sat (received dialysis 07/19)   BGs:                                                                             Daily Weights: no                                            Daily Bath (soap & water /CHG): ___    Restraints: (no)  Violent or nonviolent  When restraints order renewal needed:___         Anticipatory Guidance / Discharge Planning  Dentures: Uppers []  Lowers [] , Hearing Aids R[]  L[] , Glasses []   Belongings in Safe/Cashiers Office []   Support Person/Contact:   Tasks Pending:  Education Needed:                                                                          D/C to:

## 2023-10-04 NOTE — INTERIM OP NOTE (Signed)
 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 Anticipated Removal Date:.        Packing:                 Patient Condition: good       Findings (Including unexpected complications): As expected     Signed:  Edelman Lynn Ervin, MD  on 10/04/2023 at 9:13 AM

## 2023-10-05 ENCOUNTER — Inpatient Hospital Stay

## 2023-10-05 DIAGNOSIS — I120 Hypertensive chronic kidney disease with stage 5 chronic kidney disease or end stage renal disease: Secondary | ICD-10-CM

## 2023-10-05 LAB — BASIC METABOLIC PANEL
Anion Gap: 17 — ABNORMAL HIGH (ref 7–16)
CO2: 19 mmol/L — ABNORMAL LOW (ref 20–28)
Calcium: 8.8 mg/dL (ref 8.6–10.2)
Chloride: 96 mmol/L (ref 96–108)
Creatinine: 10.5 mg/dL — ABNORMAL HIGH (ref 0.67–1.17)
Glucose: 79 mg/dL (ref 60–99)
Lab: 58 mg/dL — ABNORMAL HIGH (ref 6–20)
Potassium: 4.9 mmol/L (ref 3.3–5.1)
Sodium: 132 mmol/L — ABNORMAL LOW (ref 133–145)
eGFR BY CREAT: 5 — AB

## 2023-10-05 LAB — CBC AND DIFFERENTIAL
Baso # K/uL: 0.1 THOU/uL (ref 0.0–0.2)
Eos # K/uL: 0.3 THOU/uL (ref 0.0–0.5)
Hematocrit: 33 % — ABNORMAL LOW (ref 37–52)
Hemoglobin: 9.8 g/dL — ABNORMAL LOW (ref 12.0–17.0)
IMM Granulocytes #: 0.2 THOU/uL — ABNORMAL HIGH
IMM Granulocytes: 1.9 %
Lymph # K/uL: 1.2 THOU/uL (ref 1.0–5.0)
MCV: 82 fL (ref 75–100)
Mono # K/uL: 1.4 THOU/uL — ABNORMAL HIGH (ref 0.1–1.0)
Neut # K/uL: 8.9 THOU/uL — ABNORMAL HIGH (ref 1.5–6.5)
Nucl RBC # K/uL: 0 THOU/uL (ref 0.0–0.1)
Nucl RBC %: 0.2 /100{WBCs} (ref 0.0–0.2)
Platelets: 500 THOU/uL — ABNORMAL HIGH (ref 150–450)
RBC: 4 MIL/uL (ref 4.0–6.0)
RDW: 19.5 % — ABNORMAL HIGH (ref 0.0–15.0)
Seg Neut %: 73 %
WBC: 12.2 THOU/uL — ABNORMAL HIGH (ref 3.5–11.0)

## 2023-10-05 LAB — CRP: CRP: 273 mg/L — ABNORMAL HIGH (ref 0–8)

## 2023-10-05 LAB — HEPATIC FUNCTION PANEL
ALT: 8 U/L (ref 0–50)
AST: 41 U/L (ref 0–50)
Albumin: 3.1 g/dL — ABNORMAL LOW (ref 3.5–5.2)
Alk Phos: 190 U/L — ABNORMAL HIGH (ref 40–130)
Bilirubin,Direct: <0.2 mg/dL (ref 0.0–0.3)
Bilirubin,Total: 0.3 mg/dL (ref 0.0–1.2)
Total Protein: 5.9 g/dL — ABNORMAL LOW (ref 6.3–7.7)

## 2023-10-05 LAB — SEDIMENTATION RATE, AUTOMATED: Sedimentation Rate: >=130 mm/h — AB (ref 0–20)

## 2023-10-05 LAB — PHOSPHORUS: Phosphorus: 4.6 mg/dL — ABNORMAL HIGH (ref 2.7–4.5)

## 2023-10-05 LAB — MAGNESIUM: Magnesium: 1.8 mg/dL (ref 1.6–2.5)

## 2023-10-05 LAB — VANCOMYCIN, RANDOM: Vancomycin Level: 13.1 ug/mL

## 2023-10-05 MED ORDER — ALTEPLASE 2 MG IJ SOLR *I*
INTRAMUSCULAR | Status: AC
Start: 2023-10-05 — End: 2023-10-05
  Administered 2023-10-05: 1 mg
  Filled 2023-10-05: qty 4

## 2023-10-05 MED ORDER — HEPARIN 1000 UNITS/ML SYRINGE (BOLUS) *I*
1000.0000 [IU] | Status: DC | PRN
Start: 2023-10-05 — End: 2023-12-04
  Administered 2023-10-05 – 2023-10-10 (×3): 1000 [IU] via INTRAVENOUS_CENTRAL

## 2023-10-05 MED ORDER — ALTEPLASE 2 MG IJ SOLR *I*
0.0000 mg | INTRAMUSCULAR | Status: DC | PRN
Start: 2023-10-05 — End: 2023-12-04
  Administered 2023-10-05: 1 mg

## 2023-10-05 MED ORDER — ALTEPLASE 2 MG IJ SOLR *I*
0.0000 mg | Freq: Once | INTRAMUSCULAR | Status: AC
Start: 2023-10-05 — End: 2023-10-05
  Administered 2023-10-05: 1 mg
  Filled 2023-10-05: qty 2

## 2023-10-05 MED ORDER — HEPARIN 1000 UNITS/ML SYRINGE (FOR DIALYSIS) *I*
500.0000 [IU]/h | Status: DC | PRN
Start: 2023-10-05 — End: 2023-12-04
  Administered 2023-10-05 – 2023-10-10 (×3): 500 [IU]/h via INTRAVENOUS_CENTRAL
  Filled 2023-10-05: qty 10

## 2023-10-05 MED ORDER — VANCOMYCIN HCL IN NACL 1250 MG/275 ML IV SOLN *I*
1250.0000 mg | Freq: Once | INTRAVENOUS | Status: AC
Start: 2023-10-05 — End: 2023-10-05
  Administered 2023-10-05: 1250 mg via INTRAVENOUS
  Filled 2023-10-05: qty 1250

## 2023-10-05 MED ORDER — VANCOMYCIN HCL IN NACL 1250 MG/275 ML IV SOLN *I*
1250.0000 mg | Freq: Once | INTRAVENOUS | Status: DC
Start: 2023-10-05 — End: 2023-10-05
  Filled 2023-10-05: qty 275

## 2023-10-05 MED ORDER — ALTEPLASE 2 MG IJ SOLR *I*
0.0000 mg | INTRAMUSCULAR | Status: DC | PRN
Start: 2023-10-05 — End: 2023-12-04

## 2023-10-05 NOTE — Progress Notes (Incomplete)
 Report Given To [id number] RN        Descriptive Sentence / Reason for Admission   Writer took over  treatment from [facility] RN after HD line was dwelled with TPA.   Completed in total today 3 hours;  Positive     Patient requested off early one hour: Nephrology notified    Complications: BFR 300:HD line spasmic Dwelled with TPA        Active Issues / Relevant Events       Review of medications administered  such as routine meds, antibiotics, and heparin : see MAR  Vital Signs: see Flowsheet        To Do List  Medications still needing administration: n/a  Dressing change due : 10/08/2023      Anticipatory Guidance / Discharge Planning  Date/Time of Next Treatment: TBD    Please weigh patient daily to monitor fluid gains-Thanks    Treatment is being Completed by dialysis nurse.   Thank you  Abigail Lakes, RN

## 2023-10-05 NOTE — Progress Notes (Addendum)
 Report Given To  Laneta Alm RN      Descriptive Sentence / Reason for Admission   Team:    Presented with:intermittent (L) shoulder pain x 2 weeks. Inability to ambulate x 1 week r/t (R) leg pain. + muscle aches + fatigue + weight loss + night sweats + poor PO intake  Relevant PMHx:ESRD HD (T,Th,S) Osteoarthritis, Hypotension  Ik:ipjmmyzj           Active Issues / Relevant Events   Shift Event (last 24 hours):  7/25 N NAE.   Precautions:        AOX:X4  Mobility: Independent with RW  Pills: Whole  Meals: Clear Liquids   Continent: (yes/no)  IV:  g22 right hand   (drsg due__)   (Line cap change due__)  IVF/gtts:     Drains/tubes/O2/Bipap/CPAP:     Patient Behaviors:Calm           To Do List  VS:Q4  Tele: yes  Sticker Change Daily []   Wound Care:  Labs/Tests: ESRD, dialysis T/Th/Sat (received dialysis 07/19)   BGs:                                                                             Daily Weights: no                                            Daily Bath (soap & water /CHG): ___    Restraints: (no)  Violent or nonviolent  When restraints order renewal needed:___         Anticipatory Guidance / Discharge Planning  Dentures: Uppers []  Lowers [] , Hearing Aids R[]  L[] , Glasses []   Belongings in Safe/Cashiers Office []   Support Person/Contact:   Tasks Pending:  Education Needed:                                                                          D/C to:

## 2023-10-05 NOTE — Plan of Care (Signed)
 Brief Rheumatology Plan of Care Note    Spoke to pathology resident, preliminary results of temporal artery biopsy do not show evidence of temporal arteritis, with final pathology report anticipated Monday. Patient is being worked up for malignancy which is the most likely explanation for his presenting symptoms, so clinical suspicion for GCA would be low at this juncture. Rheumatology team will sign off. Please do not hesitate to reach out to on call rheumatology fellow if new questions arise.    Discussed with my attending Dr. Nels.     Jinny Person, MD  Rheumatology Fellow

## 2023-10-05 NOTE — Progress Notes (Signed)
 Report Given To  10/05/23  Patient arrived to dialysis in bed via transporters. Patient asked to be weighed prior to starting dialysis. Weight prior to dialysis on the standing scale is 77.5 kg. Started treatment and with in 20 minutes the machine was alarming. Lines were reversed multiple times. Spoke with provider about this. She ordered cath flo for the catheter and heparin  during dialysis. Cathflo was left to dwell for 60 minutes. Midodrine  was given at 1615. Treatment was restarted at 1640. Evening nurse Kirstin will take over for this dialysis treatment.      Descriptive Sentence / Reason for Admission   Duration of dialysis in minutes : 25 minutes.    Complications: System clotted off and patient was hypotensive. These issues were resolved and patient was restarted on dialysis.        Active Issues / Relevant Events       Review of medications administered  such as routine meds, antibiotics, and heparin : see MAR  Vital Signs: see Flowsheet        To Do List  Medications still needing administration: n/a  Dressing change due : 10/08/2023      Anticipatory Guidance / Discharge Planning  Date/Time of Next Treatment: TBD    Please weigh patient daily to monitor fluid gains-Thanks    Treatment is being Completed by dialysis nurse.   Thank you  K.Ademola Vert R.N.

## 2023-10-05 NOTE — Progress Notes (Signed)
 Hospital Medicine Service Attending Progress Note    Significant Events/Subjective:   NAEON. Examined this morning with HMD APP. Vitals stable.  Sitting up in chair, responding to emails.  He feels he has more color than he did when he came in.  Still feels tired.  Appetite okay. Bowel movement X1.  Plan for HD later today.  Has not utilized oxycodone  since 7/22.    Objective:     Physical Exam  Temp:  [36.1 C (97 F)-37.4 C (99.3 F)] 36.1 C (97 F)  Heart Rate:  [74-96] 96  Resp:  [15-18] 16  BP: (91-144)/(50-88) 136/86       Constitutional: Laying in bed, talkative, no acute distress, alert  Head: Right temporal artery bandage with minimal strikethrough bleeding  CV: Regular rate, regular rhythm, no murmur/rub/gallops, no pedal edema  Resp: Normal work of breathing on room air, continue to see improved aeration bilaterally without adventitious lung sounds  GI: Abdomen soft, nontender, somewhat distended, no organomegaly  GU: No Foley  MSK: Able to move all extremities spontaneously, normal bulk  Skin: Warm, dry, intact  Neuro: AO x 4  Psych: Good insight, fair mood, reasonable    Recent Lab Studies:  Personally reviewed and notable for:  Recent Labs   Lab 10/05/23  0548 10/04/23  0300 10/03/23  0711   WBC 12.2* 13.5* 11.6*   Hemoglobin 9.8* 9.7* 9.4*   Hematocrit 33* 33* 31*   Platelets 500* 449 481*      Recent Labs   Lab 10/05/23  0548 10/04/23  0300 10/03/23  0711   Sodium 132* 132* 132*   Potassium 4.9 4.6 5.2*   Chloride 96 94* 94*   CO2 19* 18* 21   UN 58* 39* 56*   Creatinine 10.50* 8.00* 10.50*   Glucose 79 77 77   Calcium  8.8 8.8 8.9   Albumin 3.1* 3.1* 3.1*   Phosphorus 4.6* 4.1 3.8     Recent Labs   Lab 09/30/23  1943   INR 1.4*   Protime 15.7*       Recent Imaging Studies:  IR bone biopsy  Result Date: 10/04/2023  CT-guided biopsy of L5 vertebral body lesion. Plan: Specimen(s) sent for evaluation. END OF IMPRESSION I was present for the critical and key portions of the procedure or visit and was  immediately available in the suite to assist. I have personally reviewed the images and the Resident's/Fellow's interpretation and agree with or edited the findings.     MR spine thoracic without and with contrast  Result Date: 10/04/2023  Ill-defined enhancement with T2/STIR hyperintensity in the T6 vertebral body, suspicious for osseous metastasis. A smaller lesion is also seen within the T12 vertebral body. Several enhancing soft tissue lesions in the thoracic paraspinal musculature, likely representing metastases. 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 Partially visualized expansile enhancing lesion in the C7 vertebra which appears to extend to the left anterolateral epidural space and neural foramen. Appearance is similar to prior MR C-spine. Bilateral pleural effusions with associated pleural thickening. END OF IMPRESSION I have personally reviewed the images and the Resident's/Fellow's interpretation and agree with or edited the findings.     MR lumbar spine without and with contrast  Result Date: 10/04/2023  Multiple enhancing osseous lesions throughout the lumbar spine, sacrum, and visualized portions of the iliac bones, compatible with metastatic disease. Several enhancing soft tissue lesions in the bilateral psoas and paraspinal musculature, likely also representing metastases. There  is a background of abnormal low T1 bone marrow signal which may relate to underlying renal osteodystrophy. Multiple abnormal appearing retroperitoneal lymph nodes. Please also see separately dictated CT abdomen pelvis for additional details. END OF IMPRESSION I have personally reviewed the images and the Resident's/Fellow's interpretation and agree with or edited the findings.       Assessment:     Shane Pearson is a  63 y.o. male with ESRD (on HD TTS), hypotension, and lumbosacral spondylosis who presented to the ED for chest pain and left shoulder pain. Initial work  up was notable for mild leukocytosis, chronic anemia, thrombocytosis, elevated NT-pro BNP, elevated but adynamic troponin, elevated AST and Alk Phos, and ESRD.  Initial chest x-ray demonstrating right sided opacity, concern for atelectasis and minimal right pleural effusion.  He was admitted for constitutional symptoms and workup concerning for occult infectious process, rheumatologic disorder or malignancy.  Further workup since admission is highly suggestible for metastatic disease of unclear primary with multiple cervical/thoracic/lumbar spine lesions with extension into paraspinal soft tissues, bilateral psoas lesions,  multiple nodal/intramuscular lesions on CT A/P all likely metastatic lesions. Further, has b/l exudative pleural effusions s/p tap (pending cytology).  Currently, patient is clinically stable, pending further diagnostic results with pathology.      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  This was discussed with patient.    -CRP downtrending since broad-spectrum antibiotics (365> 338> 295> 273)  -ESR is worsening since admission (121> 121> 124> 130)  -Blood cultures 7/22 2300: Negative to date  -Neurosurgery signed off-will call back if concern for cervical osteo persists and when the biopsy path returns, appreciate ongoing recommendations.  Continue vanc/cefepime /flagyl  for broad spectrum coverage s/p IR biopsy in case of cervical osteo-will continue until pathology results return  - Heme/onc following, appreciate ongoing recommendations.  For suspected malignancy of unclear primary; if PET scan inpatient possible? Will consider thyroid biopsy if spine pathology and pleural fluid is non-diagnostic  - Rad Onc consulted and pending patient biopsy results prior to pain intervention per patient preference  - Follow up MRI brain, ordered and pending   -Pain  control: 1000 mg Tylenol  scheduled 3 times daily, breakthrough oral oxycodone  as needed for bone pain.  Can progress to IV dilaudid  as needed (ESRD)    Large right exudative pleural effusion s/p thoracentesis (7/22)  Moderate left pleural effusion  -Appears layered on CT  -No growth on Gram stain  -Nucleated cells 353, RBC 5380 with yellow cloudy fluid (not hemorrhagic)  -Cell count: Predominantly macrophages with many lymphocytes and segmented neutrophils  with few mesothelial cells. Atypical cells present, defer to cytology.  -Pending cytology, culture (no growth on anaerobic culture yet, aerobic culture was canceled by lab)    Vertical diplopia  Concern for GCA, s/p temporal artery biopsy (7/25)  -Ophthalmology consulted and following, planning for biopsy 7/25 for GCA  -Wonder if there could be infiltration of cranial nerves and if additional brain imaging with MRI would be warranted considering acute onset and setting of potential malignancy.  No obvious tumor seen on CT head  -ESR/CRP trend every 3 days -ESR significantly unchanged since admission, CRP improving    Moderate refeeding syndrome risk  -BMP, mag, Phos every day x 3 days then reevaluate (7/24 - 7/26)  - No repletion's required thus far    Isolated alkaline phosphatase elevation  Could be from bone  -GGT within normal limits, this  is from bone    Mild anion gap metabolic acidosis  Hyponatremia  Hypochloremia  Uremia  Hyperkaleima s/p temporizer, resolved  ESRD with HD (T/TH/Sat)  -Reassess after hemodialysis  - Nephrology following closely   - BMP every AM     Normocytic anemia  Likely secondary to ESRD  -Nephrology and holding Epogen    Subacute diarrhea  -Stool studies negative     Additional concerns-  -Periodontal disease seen on CT-dental evaluation outpatient  -Multiple pulmonary nodules 4 mm seen on CT-considering workup for malignancy, may not need to evaluate these further outpatient    Chronic problems-  Chronic hypotension-continue home  midodrine  home fludrocortisone   GERD- continue home Prilosec 20 mg  Hyperlipidemia - Continue atorvastatin   Metabolic bone disease-continue Cinacalcet   Is on ASA 81mg  dating back to 2020 per chart review -unclear reasoning. If for primary prevention, will need to discontinue permanently. Will assess further with patient - per prior doc, does not appear to have hx of CVA/MI.  Currently being held in preparation for IR biopsy and GCA biopsy  Allergic rhinitis - Continue flonase     DVT PPx: Heparin  subcu    Emergency Contact: [name] (spouse/HCP) [phone]   Code Status: Full code    Discharge Planning:   MRDD: Unclear  Discharge Criteria/Barriers to Discharge: Further workup of potential malignancy including biopsy/outpatient connection, rule out cervical osteo  PT and/or OT Recommendations/Discharge to: PT recommending living facility.  OT recommending ongoing support for ADLs.    Appointments needed with: PCP, heme-onc, neurosurg, ophthalmology    Case discussed with HMD APP,heme-onc, interdisciplinary team, Optho, patient.  I personally spent 35 minutes on the calendar day of the encounter, including pre and post visit work.      Stelios Kirby, MD on 10/05/2023 at 8:28 AM

## 2023-10-05 NOTE — Progress Notes (Signed)
 Report Given To  Marthena POUR., RN      Descriptive Sentence / Reason for Admission   Team:    Presented with:intermittent (L) shoulder pain x 2 weeks. Inability to ambulate x 1 week r/t (R) leg pain. + muscle aches + fatigue + weight loss + night sweats + poor PO intake  Relevant PMHx:ESRD HD (T,Th,S) Osteoarthritis, Hypotension  Ik:ipjmmyzj           Active Issues / Relevant Events   Shift Event (last 24 hours):  7/26D: Patient cooperative with care and medications. Patient dialyzed today. Ambulated to the bathroom with RW and standby assist, reports x2 BM.   Precautions:        AOX:X4  Mobility: Cane  Pills: Whole  Meals: NPO   Continent: (yes/no)  IV:  g22 right hand   (drsg due__)   (Line cap change due__)  IVF/gtts:     Drains/tubes/O2/Bipap/CPAP:     Patient Behaviors:Calm           To Do List  VS:Q4  Tele: yes  Sticker Change Daily []   Wound Care:  Labs/Tests: ESRD, dialysis T/Th/Sat (received dialysis 07/19)   BGs:                                                                             Daily Weights: no                                            Daily Bath (soap & water /CHG): ___    Restraints: (no)  Violent or nonviolent  When restraints order renewal needed:___         Anticipatory Guidance / Discharge Planning  Dentures: Uppers []  Lowers [] , Hearing Aids R[]  L[] , Glasses []   Belongings in [employer] []   Support Person/Contact:   Tasks Pending:  Education Needed:                                                                          D/C to:

## 2023-10-05 NOTE — Progress Notes (Signed)
 Report Given To [id number] RN        Descriptive Sentence / Reason for Admission   Writer took over  treatment from [facility] RN after HD line was dwelled with TPA.   Completed in total today 3 hours;  Positive 100 mL    Patient requested off early one hour: Nephrology notified    Complications: BFR 300:HD line spasmic Dwelled with TPA        Active Issues / Relevant Events       Review of medications administered  such as routine meds, antibiotics, and heparin : see MAR  Vital Signs: see Flowsheet        To Do List  Medications still needing administration: n/a  Dressing change due : 10/08/2023      Anticipatory Guidance / Discharge Planning  Date/Time of Next Treatment: TBD by Nephrology    Please weigh patient daily to monitor fluid gains-Thanks    Treatment is being Completed by dialysis nurse.   Thank you  Abigail Lakes, RN

## 2023-10-05 NOTE — Progress Notes (Addendum)
 Nephrology Progress Note   LOS: 5 days     Interval History  No acute events overnight  - temp artery biopsy neg for temporal arteritis     Subjective   Patient seen & examined at the dialysis unit during dialysis. Line was closed with TPA last treatment and appears that his lines are not working at this time. Will let TPA dwell and re-initiate HD.      Objective      Current Vitals Vitals Range (24 Hours)   BP (!) 143/94 (BP Location: Right leg) Comment: RN notified  Pulse 94   Temp 36.2 C (97.2 F) (Temporal)   Resp 18   Ht 1.651 m (5' 5)   Wt 79.8 kg (176 lb)   SpO2 97%   BMI 29.29 kg/m  BP: (107-144)/(52-94)   Temp:  [36.1 C (97 F)-36.4 C (97.5 F)]   Temp src: Temporal (07/26 1220)  Heart Rate:  [74-96]   Resp:  [16-18]   SpO2:  [94 %-97 %]      I/Os Weight   No intake/output data recorded. Last 4 Weights    09/29/23 0031 09/29/23 0041 10/01/23 0810 10/01/23 2003   Weight: 80.1 kg (176 lb 9.4 oz) 80.1 kg (176 lb 9.4 oz) 79.9 kg (176 lb 2.4 oz) 79.8 kg (176 lb)        Physical Exam:  Constitutional:  General: Not in acute distress, laying in bed,   HENT:  Head: Normocephalic and atraumatic  Nose: Nose normal  CVS:   Rate and Rhythm: Normal rate and rhythm  Pulmonary:   Effort: No respiratory distress on room air   MSK: Warm and well perfused  Neuro:   General: No focal deficit noted  Mental status: Alert and oriented x3  Psych:   Mood and Affect: Mood normal.      Behavior: Behavior normal.   Access: HDTC, dressing CDI    Recent Labs   Lab 10/05/23  0548 10/04/23  0300 10/03/23  0711   Sodium 132* 132* 132*   Potassium 4.9 4.6 5.2*   Chloride 96 94* 94*   CO2 19* 18* 21   Anion Gap 17* 20* 17*   UN 58* 39* 56*   Creatinine 10.50* 8.00* 10.50*   Glucose 79 77 77   Calcium  8.8 8.8 8.9   Albumin 3.1* 3.1* 3.1*   Phosphorus 4.6* 4.1 3.8   Magnesium  1.8 1.8 1.9     No results found for: BKVQL  No results found for: CMVQL  Aerobic bacterial pleural or thoracentesis fluid culture and stain   Date  Value Ref Range Status   10/01/2023 Lab Cancel  Final     Bacterial Blood Culture   Date Value Ref Range Status   10/01/2023 .  Preliminary   10/01/2023 .  Preliminary        Recent Labs   Lab 10/05/23  0548 10/04/23  0300 10/03/23  0711   WBC 12.2* 13.5* 11.6*   Hemoglobin 9.8* 9.7* 9.4*   Hematocrit 33* 33* 31*   Platelets 500* 449 481*         Component Value Date/Time    FER 650 (H) 09/29/2023 0327     No results for input(s): TACRR, SIR, CSAM in the last 168 hours.   No results for input(s): JOLINDA PLEVA, [place], [place], [place], [place], UOSMO in the last 168 hours.  No results for input(s): UAPP, UCOL, UAGLU, KETONESU, USG, UBLD, UAPH, UPRO, UNITR, ULEU, [place], UWBC, [place], [place],  KETONESUA, SPECIFICGRAV, BLOODUA, PHUA, PROTEIN, NITRITEUA, LEUKESTERASE, GLUCOSESIE, KETONESIEM, PUSG, BLOODSIEMEN, PUAPH, PROTEINUA, PUNIT, LEUKOCYTESIE in the last 8760 hours.           IR bone biopsy  Result Date: 10/04/2023  CT-guided biopsy of L5 vertebral body lesion. Plan: Specimen(s) sent for evaluation. END OF IMPRESSION I was present for the critical and key portions of the procedure or visit and was immediately available in the suite to assist. I have personally reviewed the images and the Resident's/Fellow's interpretation and agree with or edited the findings.     MR spine thoracic without and with contrast  Result Date: 10/04/2023  Ill-defined enhancement with T2/STIR hyperintensity in the T6 vertebral body, suspicious for osseous metastasis. A smaller lesion is also seen within the T12 vertebral body. Several enhancing soft tissue lesions in the thoracic paraspinal musculature, likely representing metastases. 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 Partially visualized expansile enhancing lesion in the C7 vertebra which appears to extend to the left anterolateral epidural space and neural  foramen. Appearance is similar to prior MR C-spine. Bilateral pleural effusions with associated pleural thickening. END OF IMPRESSION I have personally reviewed the images and the Resident's/Fellow's interpretation and agree with or edited the findings.     MR lumbar spine without and with contrast  Result Date: 10/04/2023  Multiple enhancing osseous lesions throughout the lumbar spine, sacrum, and visualized portions of the iliac bones, compatible with metastatic disease. Several enhancing soft tissue lesions in the bilateral psoas and paraspinal musculature, likely also representing metastases. There is a background of abnormal low T1 bone marrow signal which may relate to underlying renal osteodystrophy. Multiple abnormal appearing retroperitoneal lymph nodes. Please also see separately dictated CT abdomen pelvis for additional details. END OF IMPRESSION I have personally reviewed the images and the Resident's/Fellow's interpretation and agree with or edited the findings.        Current Medications  Scheduled Meds:   vancomycin   1,250 mg Intravenous Once    alteplase         heparin  (porcine)  5,000 units Subcutaneous TID    ceFEPime  (MAXIPIME ) IV  1,000 mg Intravenous Q24H    metroNIDAZOLE   500 mg Oral 3 times per day    polyethylene glycol  17 g Oral Daily    patiromer   16.8 g Oral Daily @ 1400    acetaminophen   1,000 mg Oral TID    magnesium  oxide  400 mg Oral Daily    atorvastatin   20 mg Oral Daily with dinner    B complex-vitamin C-folic acid   1 tablet Oral Daily    calcium -vitamin D   1 tablet Oral BID    cinacalcet   30 mg Oral Daily    fludrocortisone   0.1 mg Oral TID    fluticasone   1 spray Nasal Daily    midodrine   15 mg Oral TID    omeprazole   40 mg Oral QAM    Lidocaine   2 patch Transdermal Q24H     Continuous Infusions:   Vancomycin  - Pharmacist to Dose       PRN Meds:alteplase  injection **AND** alteplase  injection, alteplase , sodium chloride , dextrose , lidocaine  HCL, oxyCODONE , heparin  (porcine),  heparin  (porcine), sodium chloride , dextrose         Assessment  Shane Pearson is a 63 y.o. Male with history significant for ESRD (on HD TTS), hypotension, and lumbosacral spondylosis who presented to the ED for pain and difficulty walking. Workup concerning for malignancy, considering mediastinal lymphadenopathy. Nephrology consulted for dialysis  needs.     Plan    ESRD 2/2 HTN on HD TTS at [facility]:  Dose adjust medications to CrCl <10 mL/s  Continue nephrovite  - reviewed outpatient orders and receives heparin  with treatment - I ordered starting today to help with clotting.   Will turn temp to 35 and give his ATC midodrine  once TPA has dwelled. Dialysis rx:    Active Dialysis Orders   Dialysis    Hemodialysis     Frequency: Every Tues,Thurs & Sat     Start Date/Time: 10/03/23 9147      Number of Occurrences: Until Specified     Order Questions:      Dialyzer F180      K/Ca (meq) 2K/2.5Ca      Na (meq) 135      Na Modeling None      Bicarb (meq) 30      Dialysate Temp (C) 35      BFR (mL/min) 400      DFR (mL/min) 1.5x BFR      Duration (hours) 4      EDW (kgs) TBD      Access CVC      Vital Signs Vitals signs at the start of treatment and every 30 minutes      Vital Signs Check temperature at the start of treatment and at the end of treatment      Monitoring May place patient on telemtry per nursing descretion for concerns regarding stability      Notify dialysis provider for Pulse Less Than 50      Notify dialysis provider for Pulse Greater Than 130      Notify dialysis provider for Temperature (Celsius) Above 38      Notify dialysis provider for Systolic Blood Pressure Below 90      Notify dialysis provider for Systolic Blood Pressure Above 819      Fluid removal (L) 0.5-3L , map over 65      Fluid removal instructions Decrease fluid removal goal by 500 ml  if blood pressure is decreasing or HR increasing. Reassess response every 15 minutes.      Fluid removal instructions May increase fluid removal goal by 500 ml if  blood pressure and HR are stable.  Reassess response every 15 minutes.      Fluid removal instructions Decrease fluid removal to minimum if call/hold parameters for BP and/or HR exceeded.  Reassess response every 15 minutes.      Fluid removal instructions If no BP/HR response in 15 minutes after notifying MD, give 200 ml isotonic saline.  Reassess response every 15 minutes.       Hyperkalemia/hyponatremia   Potassium WNL  Mild hyponatremia, should improve with HD today    - Continue low K diet  - Would start [place]  16.8g daily     CKD-BMD:  Calcium  Level: 8.8  PO4 Level: 4.6 , should improve with dialysis   - Calcimimetic: sensipar  30 mg daily     Acid-Base Disorders:   Controlled with HD   Will keep within desired goal range (serum bicarb 22-25) with RRT against 35 bicarb    Hemodynamics:   SBP range: 130-140's  Continue antihypertensive meds: midodrine  15 mg TID with meals   Will remove fluid with scheduled HD treatments    Anemia:   Hgb as above  No ESA indicated at this time  Hemoglobin goal (chronically) 10-11.5 gm/dl  Transfusion of PRBC as needed per primary team    Next  treatment per schedule     Ronal Critchley, NP  Nephrology Division  [facility]  Pager [phone]  10/05/2023 3:14 PM

## 2023-10-06 ENCOUNTER — Inpatient Hospital Stay

## 2023-10-06 LAB — BASIC METABOLIC PANEL
Anion Gap: 18 — ABNORMAL HIGH (ref 7–16)
CO2: 21 mmol/L (ref 20–28)
Calcium: 8.5 mg/dL — ABNORMAL LOW (ref 8.6–10.2)
Chloride: 95 mmol/L — ABNORMAL LOW (ref 96–108)
Creatinine: 7.62 mg/dL — ABNORMAL HIGH (ref 0.67–1.17)
Glucose: 65 mg/dL (ref 60–99)
Lab: 35 mg/dL — ABNORMAL HIGH (ref 6–20)
Potassium: 4.3 mmol/L (ref 3.3–5.1)
Sodium: 134 mmol/L (ref 133–145)
eGFR BY CREAT: 7 — AB

## 2023-10-06 LAB — CBC AND DIFFERENTIAL
Baso # K/uL: 0 THOU/uL (ref 0.0–0.2)
Eos # K/uL: 0.3 THOU/uL (ref 0.0–0.5)
Hematocrit: 33 % — ABNORMAL LOW (ref 37–52)
Hemoglobin: 9.6 g/dL — ABNORMAL LOW (ref 12.0–17.0)
IMM Granulocytes #: 0.3 THOU/uL — ABNORMAL HIGH
IMM Granulocytes: 2.3 %
Lymph # K/uL: 1.3 THOU/uL (ref 1.0–5.0)
MCV: 84 fL (ref 75–100)
Mono # K/uL: 1.6 THOU/uL — ABNORMAL HIGH (ref 0.1–1.0)
Neut # K/uL: 8.7 THOU/uL — ABNORMAL HIGH (ref 1.5–6.5)
Nucl RBC # K/uL: 0 THOU/uL (ref 0.0–0.1)
Nucl RBC %: 0.3 /100{WBCs} — ABNORMAL HIGH (ref 0.0–0.2)
Platelets: 463 THOU/uL — ABNORMAL HIGH (ref 150–450)
RBC: 3.9 MIL/uL — ABNORMAL LOW (ref 4.0–6.0)
RDW: 19.9 % — ABNORMAL HIGH (ref 0.0–15.0)
Seg Neut %: 71 %
WBC: 12.3 THOU/uL — ABNORMAL HIGH (ref 3.5–11.0)

## 2023-10-06 LAB — HEPATIC FUNCTION PANEL
ALT: 8 U/L (ref 0–50)
AST: 39 U/L (ref 0–50)
Albumin: 3.1 g/dL — ABNORMAL LOW (ref 3.5–5.2)
Alk Phos: 194 U/L — ABNORMAL HIGH (ref 40–130)
Bilirubin,Direct: <0.2 mg/dL (ref 0.0–0.3)
Bilirubin,Total: 0.3 mg/dL (ref 0.0–1.2)
Total Protein: 5.8 g/dL — ABNORMAL LOW (ref 6.3–7.7)

## 2023-10-06 LAB — MAGNESIUM: Magnesium: 1.8 mg/dL (ref 1.6–2.5)

## 2023-10-06 LAB — BACT FLUID CULT (BOTTLES): Bact Fluid Cult(Bottles): 0

## 2023-10-06 LAB — PHOSPHORUS: Phosphorus: 4.2 mg/dL (ref 2.7–4.5)

## 2023-10-06 MED ORDER — ACETAMINOPHEN 500 MG PO TABS *I*
1000.0000 mg | ORAL_TABLET | Freq: Three times a day (TID) | ORAL | Status: DC | PRN
Start: 2023-10-06 — End: 2023-10-11
  Administered 2023-10-07 – 2023-10-08 (×2): 1000 mg via ORAL
  Filled 2023-10-06 (×2): qty 2

## 2023-10-06 NOTE — Progress Notes (Signed)
 Report Given To  Creed SAUNDERS, RN      Descriptive Sentence / Reason for Admission   Team:    Presented with:intermittent (L) shoulder pain x 2 weeks. Inability to ambulate x 1 week r/t (R) leg pain. + muscle aches + fatigue + weight loss + night sweats + poor PO intake  Relevant PMHx:ESRD HD (T,Th,S) Osteoarthritis, Hypotension  Ik:ipjmmyzj           Active Issues / Relevant Events   Shift Event (last 24 hours):  7/23 D: Labs sent, soft BP throughout shift provider aware. Worked with PT, OOB to chair, potassium trended down currently 5.0. C/o blurred vision to provider, NPO at midnight possible biopsy tomorrow. More PT work up for discharge.   7/23 N BP on soft side, midodrine  and florinef  given. Tele discontinued by provider Brad prior to MRI. Patient trasported to MRI at [facility] and after MRI will transport directly to hemodialysis. Morning labs will be collected by HD nurse Mont Bench RN. Provider Brad Broody PA is aware and will reorder tele once the patient in hemodialysis.  7/24 D12: MRI, then HD only dialyzed for 3 hours, IR lumbar biopsy completed today, however IR called and not all needed specimens collected. Pt to go to IR for another spine biopsy 7/25.   7/25 D: Biopsy complete. VS remain stable throughout day. Full bath done by pt's wife. NPO resumes @ midnight.   7/26D: Patient cooperative with care and medications. Patient dialyzed today. Ambulated to the bathroom with RW and standby assist, reports x2 BM.   7/26 N: patient came back from Dialysis. Alert and and room air. BP within normal limit. Permacath intact.   Precautions:        AOX:X4  Mobility: Cane  Pills: Whole  Meals: NPO   Continent: (yes/no)  IV:  g22 right hand   (drsg due__)   (Line cap change due__)  IVF/gtts:     Drains/tubes/O2/Bipap/CPAP:     Patient Behaviors:Calm           To Do List  VS:Q4  Tele: yes  Sticker Change Daily []   Wound Care:  Labs/Tests: ESRD, dialysis T/Th/Sat (received dialysis 07/19)   BGs:                                                                              Daily Weights: no                                            Daily Bath (soap & water /CHG): ___    Restraints: (no)  Violent or nonviolent  When restraints order renewal needed:___         Anticipatory Guidance / Discharge Planning  Dentures: Uppers []  Lowers [] , Hearing Aids R[]  L[] , Glasses []   Belongings in [employer] Office []   Support Person/Contact:   Tasks Pending:  Education Needed:  D/C to:

## 2023-10-06 NOTE — Progress Notes (Signed)
 Treatment        Discharge Recommendations:  Discharge to Planned Living Arrangement with Caregiver Assist for ADLs and IADLs. Patient's ADL skills are currently a barrier to discharge. Further OT visits are needed.     OT need to see patient prior to discharge: Yes    Equipment Recommendations: Rolling walker    OT Hospital Stay Recommendations: Encourage participation with ADLs and OOB for meals    OT Referral Recommendations: Physical Therapy    SUBJECTIVE and OBJECTIVE:  Precautions:   Fall Precautions: General fall precautions  L/D/A: None  Activity order: Activity as tolerated  Patient wearing mask: No  Writer wearing PPE including: Gloves  Vitals: BP: 97/57/   Subjective: My arms are moving much better    Pain:   None reported this session.    TREATMENT  Cognition: No deficit noted    Vision: Continues to c/o double vision. Provider at bedside providing biopsy results; MRI brain ordered.    UE Function:   ROM:   RUE: 3/4 range at shoulder, improved  LUE: 1/2 range at shoulder   Strength: WFL    Functional mobility:  Static standing: Supervision , supported         Sit to stand: SBA, contact guard assist, Rolling walker  Stand Pivot Transfer: SBA, contact guard assist, Rolling walker  Functional mobility: SBA, contact guard assist, Rolling walker  *SBA with RW, CGA without RW (pt requests to try ambulating without RW today)    ADL Assessment: ADLs completed/ simulated with level of assist as dictated below:   Feeding: independent ; seated in chair  Grooming: SBA ; standing at sink  LB Dressing: moderate assist for socks   *Discussed modified ADL techniques for home, DME recs, work simplification, working from home    ASSESSMENT and PLAN    Assessment: Patient making progress, continue with POC    Frequency: 3-5x/week    Patient Education: role of OT, ADL and/or IADLs modification/compensatory strategies, equipment recommendations, and energy conservation and/or work simplification  Air cabin crew: patient   Readiness: acceptance   Method: explanation   Response: verbalizes understanding    Multidisciplinary Communication:  patient, RN        OCCUPATIONAL THERAPY PROVIDER   Electronically Signed By:   [place] Darline Faith, OT    Please contact the OT via Secure Chat to: [facility] with any questions/concerns and/or update requests.    Timed Calculations:  Timed Codes:  25 ADL  Untimed Codes: 0  Unbilled Time: 0  Total Time:  25

## 2023-10-06 NOTE — Progress Notes (Signed)
 Report Given To  Marthena POUR., RN      Descriptive Sentence / Reason for Admission   Team:    Presented with:intermittent (L) shoulder pain x 2 weeks. Inability to ambulate x 1 week r/t (R) leg pain. + muscle aches + fatigue + weight loss + night sweats + poor PO intake  Relevant PMHx:ESRD HD (T,Th,S) Osteoarthritis, Hypotension  Ik:ipjmmyzj           Active Issues / Relevant Events   Shift Event (last 24 hours):  7/27D: No acute event, patient pleasant and cooperative with care. Ambulating in hallway with RW. Tolerating ABX well. MRI of head ordered.   Precautions:        AOX:X4  Mobility: Cane  Pills: Whole  Meals: NPO   Continent: (yes/no)  IV:  g22 right hand   (drsg due__)   (Line cap change due__)  IVF/gtts:     Drains/tubes/O2/Bipap/CPAP:     Patient Behaviors:Calm           To Do List  VS:Q4  Tele: yes  Sticker Change Daily []   Wound Care:  Labs/Tests: ESRD, dialysis T/Th/Sat (received dialysis 07/19)   BGs:                                                                             Daily Weights: no                                            Daily Bath (soap & water /CHG): ___    Restraints: (no)  Violent or nonviolent  When restraints order renewal needed:___         Anticipatory Guidance / Discharge Planning  Dentures: Uppers []  Lowers [] , Hearing Aids R[]  L[] , Glasses []   Belongings in [employer] Office []   Support Person/Contact:   Tasks Pending:  Education Needed:                                                                          D/C to:

## 2023-10-06 NOTE — Progress Notes (Addendum)
 Hospital Medicine Service Attending Progress Note    Significant Events/Subjective:   NAEON. Examined this morning with HMD APP. Vitals stable.  Sitting up in chair, responding to emails.  He reports I feel great!  His appetite is much improved, he was up and walking around the unit, notes he feels the best he has in a long time.  Bowel movement X1 7/26.  Last HD 7/26.  Rheumatology saw patient yesterday who thinks symptoms are less consistent with GCA and more consistent with underlying malignancy.    Objective:     Physical Exam  Temp:  [35.8 C (96.4 F)-36.4 C (97.5 F)] 36.4 C (97.5 F)  Heart Rate:  [76-94] 85  Resp:  [18] 18  BP: (77-143)/(49-94) 92/49  79.8 kg (175 lb 14.8 oz)    Constitutional: Sitting up in chair with computer on table working, talkative, no acute distress, alert  Head: Right temporal artery bandage with minimal strikethrough bleeding  CV: Regular rate, regular rhythm, no murmur/rub/gallops, no pedal edema  Resp: Normal work of breathing on room air, continue to see improved aeration bilaterally without adventitious lung sounds  GI: Abdomen soft, nontender, somewhat distended, no organomegaly  GU: No Foley  MSK: Able to move all extremities spontaneously, normal bulk  Skin: Warm, dry, intact  Neuro: AO x 4  Psych: Good insight, fair mood, reasonable    Recent Lab Studies:  Personally reviewed and notable for:  Recent Labs   Lab 10/06/23  0507 10/05/23  0548 10/04/23  0300   WBC 12.3* 12.2* 13.5*   Hemoglobin 9.6* 9.8* 9.7*   Hematocrit 33* 33* 33*   Platelets 463* 500* 449      Recent Labs   Lab 10/06/23  0507 10/05/23  0548 10/04/23  0300   Sodium 134 132* 132*   Potassium 4.3 4.9 4.6   Chloride 95* 96 94*   CO2 21 19* 18*   UN 35* 58* 39*   Creatinine 7.62* 10.50* 8.00*   Glucose 65 79 77   Calcium  8.5* 8.8 8.8   Albumin 3.1* 3.1* 3.1*   Phosphorus 4.2 4.6* 4.1     Recent Labs   Lab 09/30/23  1943   INR 1.4*   Protime 15.7*       Recent Imaging Studies:  IR bone biopsy  Result  Date: 10/04/2023  CT-guided biopsy of L5 vertebral body lesion. Plan: Specimen(s) sent for evaluation. END OF IMPRESSION I was present for the critical and key portions of the procedure or visit and was immediately available in the suite to assist. I have personally reviewed the images and the Resident's/Fellow's interpretation and agree with or edited the findings.       Assessment:     Shane Pearson is a  63 y.o. male with ESRD (on HD TTS), hypotension, and lumbosacral spondylosis who presented to the ED for chest pain and left shoulder pain. Initial work up was notable for mild leukocytosis, chronic anemia, thrombocytosis, elevated NT-pro BNP, elevated but adynamic troponin, elevated AST and Alk Phos, and ESRD.  Initial chest x-ray demonstrating right sided opacity, concern for atelectasis and minimal right pleural effusion.  He was admitted for constitutional symptoms and workup initially concerning for occult infectious process, rheumatologic disorder or malignancy.  Further workup since admission is highly suggestible for metastatic disease of unclear primary with multiple cervical/thoracic/lumbar spine lesions with extension into paraspinal soft tissues, bilateral psoas lesions,  multiple nodal/intramuscular lesions on CT A/P all likely metastatic lesions s/p lumbar osseous lesion  biopsy, pending pathology. Further, has b/l exudative pleural effusions s/p tap (pending cytology).  Also found to have vertical diplopia and blurry vision s/p temporal artery biopsy for GCA with no signs for temporal arteritis with rheumatology/ophthalmology signing off. Currently, patient is clinically stable, pending further diagnostic results with pathology.      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  This was discussed with patient.    -CRP downtrending since broad-spectrum antibiotics (365>  338> 295> 273)  -ESR is worsening since admission (121> 121> 124> 130)  -Blood cultures 7/22 2300: Negative to date  -Neurosurgery signed off-will call back if concern for cervical osteo persists and when the biopsy path returns, appreciate ongoing recommendations.    - Continue vanc/cefepime /flagyl  for broad spectrum coverage s/p IR biopsy in case of cervical osteo-will continue until pathology results return  - Heme/onc following, appreciate ongoing recommendations.  For suspected malignancy of unclear primary; if PET scan inpatient possible? Will consider thyroid biopsy if spine pathology and pleural fluid is non-diagnostic  - Rad Onc consulted and pending patient biopsy results prior to pain intervention per patient preference  - Follow up MRI brain (ordered 7/22) and currently pending   -Pain control: 1000 mg Tylenol  scheduled 3 times daily.  Can transition to as needed, as minimal pain and has not needed any opiates since 7/22    Large right exudative pleural effusion s/p thoracentesis (7/22)  Moderate left pleural effusion  -Appears layered on CT  -No growth on Gram stain  -Nucleated cells 353, RBC 5380 with yellow cloudy fluid (not hemorrhagic)  -Cell count: Predominantly macrophages with many lymphocytes and segmented neutrophils  with few mesothelial cells. Atypical cells present, defer to cytology.  -Pending cytology, culture (no growth on anaerobic culture yet, aerobic culture was canceled by lab)    Vertical diplopia  Low concern for GCA, s/p temporal artery biopsy (7/25)  -Ophthalmology consulted and following  -Rheumatology was consulted at admission for concern of GCA considering blurry vision, s/p temporal artery biopsy with no signs of temporal arteritis.  Rheumatology signed off  -Wonder if there could be infiltration of cranial nerves and if additional brain imaging with MRI would be warranted considering acute onset and setting of potential malignancy.  No obvious tumor seen on CT head  -ESR/CRP  trend every 3 days -ESR significantly unchanged since admission, CRP improving    Moderate refeeding syndrome risk  -BMP, mag, Phos every day x 3 days then reevaluate (7/24 - 7/26)  - No repletion's required thus far    Isolated alkaline phosphatase elevation  Could be from bone  -GGT within normal limits, this is from bone    Mild anion gap metabolic acidosis  Hyponatremia  Hypochloremia  Uremia  Hyperkaleima s/p temporizer, resolved  ESRD with HD (T/TH/Sat)  -Reassess after hemodialysis  - Nephrology following closely   - BMP every AM     Normocytic anemia  Likely secondary to ESRD  -Nephrology and holding Epogen    Subacute diarrhea  -Stool studies negative     Additional concerns-  -Periodontal disease seen on CT-dental evaluation outpatient  -Multiple pulmonary nodules 4 mm seen on CT-considering workup for malignancy, may not need to evaluate these further outpatient    Chronic problems-  Chronic hypotension-continue home midodrine  home fludrocortisone   GERD- continue home Prilosec 20 mg  Hyperlipidemia - Continue atorvastatin   Metabolic bone disease-continue Cinacalcet   Is on ASA 81mg  dating back  to 2020 per chart review -unclear reasoning. If for primary prevention, will need to discontinue permanently. Will assess further with patient - per prior doc, does not appear to have hx of CVA/MI.  Currently being held in preparation for IR biopsy and GCA biopsy  Allergic rhinitis - Continue flonase     DVT PPx: Heparin  subcu    Emergency Contact: [name] (spouse/HCP) [phone]   Code Status: Full code    Discharge Planning:   MRDD: Unclear  Discharge Criteria/Barriers to Discharge: Further workup of potential malignancy including biopsy/outpatient connection, rule out cervical osteo  PT and/or OT Recommendations/Discharge to: PT recommending to home with PT.  OT recommending ongoing support for ADLs.  OT notes he had much improvement while hospitalized.   Appointments needed with: PCP, heme-onc, neurosurg,  ophthalmology    Case discussed with HMD APP,heme-onc, interdisciplinary team, Optho, patient.  I personally spent 35 minutes on the calendar day of the encounter, including pre and post visit work.      Adrain Butrick, MD on 10/06/2023 at 8:51 AM

## 2023-10-07 ENCOUNTER — Inpatient Hospital Stay

## 2023-10-07 ENCOUNTER — Ambulatory Visit: Payer: Self-pay

## 2023-10-07 DIAGNOSIS — I10 Essential (primary) hypertension: Secondary | ICD-10-CM

## 2023-10-07 LAB — SEDIMENTATION RATE, AUTOMATED: Sedimentation Rate: 129 mm/h — ABNORMAL HIGH (ref 0–20)

## 2023-10-07 LAB — SURGICAL PATHOLOGY

## 2023-10-07 LAB — HEPATIC FUNCTION PANEL
ALT: 11 U/L (ref 0–50)
AST: 52 U/L — ABNORMAL HIGH (ref 0–50)
Albumin: 3 g/dL — ABNORMAL LOW (ref 3.5–5.2)
Alk Phos: 199 U/L — ABNORMAL HIGH (ref 40–130)
Bilirubin,Direct: <0.2 mg/dL (ref 0.0–0.3)
Bilirubin,Total: 0.3 mg/dL (ref 0.0–1.2)
Total Protein: 5.9 g/dL — ABNORMAL LOW (ref 6.3–7.7)

## 2023-10-07 LAB — BASIC METABOLIC PANEL
Anion Gap: 25 — ABNORMAL HIGH (ref 7–16)
CO2: 13 mmol/L — ABNORMAL LOW (ref 20–28)
Calcium: 8.4 mg/dL — ABNORMAL LOW (ref 8.6–10.2)
Chloride: 96 mmol/L (ref 96–108)
Creatinine: 9.23 mg/dL — ABNORMAL HIGH (ref 0.67–1.17)
Glucose: 85 mg/dL (ref 60–99)
Lab: 47 mg/dL — ABNORMAL HIGH (ref 6–20)
Potassium: 4.6 mmol/L (ref 3.3–5.1)
Sodium: 134 mmol/L (ref 133–145)
eGFR BY CREAT: 6 — AB

## 2023-10-07 LAB — CBC AND DIFFERENTIAL
Baso # K/uL: 0.1 THOU/uL (ref 0.0–0.2)
Eos # K/uL: 0.3 THOU/uL (ref 0.0–0.5)
Hematocrit: 34 % — ABNORMAL LOW (ref 37–52)
Hemoglobin: 9.6 g/dL — ABNORMAL LOW (ref 12.0–17.0)
IMM Granulocytes #: 0.3 THOU/uL — ABNORMAL HIGH
IMM Granulocytes: 2.5 %
Lymph # K/uL: 1.2 THOU/uL (ref 1.0–5.0)
MCV: 88 fL (ref 75–100)
Mono # K/uL: 1.3 THOU/uL — ABNORMAL HIGH (ref 0.1–1.0)
Neut # K/uL: 8.4 THOU/uL — ABNORMAL HIGH (ref 1.5–6.5)
Nucl RBC # K/uL: 0.1 THOU/uL (ref 0.0–0.1)
Nucl RBC %: 0.7 /100{WBCs} — ABNORMAL HIGH (ref 0.0–0.2)
Platelets: 479 THOU/uL — ABNORMAL HIGH (ref 150–450)
RBC: 3.9 MIL/uL — ABNORMAL LOW (ref 4.0–6.0)
RDW: 21 % — ABNORMAL HIGH (ref 0.0–15.0)
Seg Neut %: 72.3 %
WBC: 11.7 THOU/uL — ABNORMAL HIGH (ref 3.5–11.0)

## 2023-10-07 LAB — MAGNESIUM: Magnesium: 1.8 mg/dL (ref 1.6–2.5)

## 2023-10-07 LAB — BLOOD CULTURE
Bacterial Blood Culture: 0
Bacterial Blood Culture: 0

## 2023-10-07 LAB — PHOSPHORUS: Phosphorus: 3.7 mg/dL (ref 2.7–4.5)

## 2023-10-07 LAB — CRP: CRP: 219 mg/L — ABNORMAL HIGH (ref 0–8)

## 2023-10-07 NOTE — Progress Notes (Signed)
 Patient is scheduled for hemodialysis 1st shift tomorrow morning. Transporters will arrive for patient between 0700-0800. Please obtain an accurate weight prior to pick up, ensure the patient is ready for transport, and administer any pre-dialysis medications. Also, please make sure patient is on HoverMatt prior to coming to Dialysis. If patient is to receive MIDODRINE please ensure dose has been administered prior to the patient leaving for HD. Please consult pharmacy if you are unsure of medications that should be held prior to HD.     Please weigh patient daily for accurate fluid monitoring.    Thank you.    Edmon Crape, RN

## 2023-10-07 NOTE — Progress Notes (Signed)
 Report Given To  Creed SAUNDERS, RN      Descriptive Sentence / Reason for Admission   Team:    Presented with:intermittent (L) shoulder pain x 2 weeks. Inability to ambulate x 1 week r/t (R) leg pain. + muscle aches + fatigue + weight loss + night sweats + poor PO intake  Relevant PMHx:ESRD HD (T,Th,S) Osteoarthritis, Hypotension  Ik:ipjmmyzj           Active Issues / Relevant Events   Shift Event (last 24 hours):  7/23 D: Labs sent, soft BP throughout shift provider aware. Worked with PT, OOB to chair, potassium trended down currently 5.0. C/o blurred vision to provider, NPO at midnight possible biopsy tomorrow. More PT work up for discharge.   7/23 N BP on soft side, midodrine  and florinef  given. Tele discontinued by provider Brad prior to MRI. Patient trasported to MRI at [facility] and after MRI will transport directly to hemodialysis. Morning labs will be collected by HD nurse Mont Bench RN. Provider Brad Broody PA is aware and will reorder tele once the patient in hemodialysis.  7/24 D12: MRI, then HD only dialyzed for 3 hours, IR lumbar biopsy completed today, however IR called and not all needed specimens collected. Pt to go to IR for another spine biopsy 7/25.   7/25 D: Biopsy complete. VS remain stable throughout day. Full bath done by pt's wife. NPO resumes @ midnight.   7/26D: Patient cooperative with care and medications. Patient dialyzed today. Ambulated to the bathroom with RW and standby assist, reports x2 BM.   7/27D: No acute event, patient pleasant and cooperative with care. Ambulating in hallway with RW. Tolerating ABX well. MRI of head ordered.   7/27N: Patient had a quite night. Vital signs stable. Labs drew and sent over for processing. Patient and oriented x4 and on room air. Ambulated with walker to bathroom.   Precautions:        AOX:X4  Mobility: Cane  Pills: Whole  Meals: NPO   Continent: (yes/no)  IV:  g22 right hand   (drsg due__)   (Line cap change due__)  IVF/gtts:      Drains/tubes/O2/Bipap/CPAP:     Patient Behaviors:Calm           To Do List  VS:Q4  Tele: yes  Sticker Change Daily []   Wound Care:  Labs/Tests: ESRD, dialysis T/Th/Sat (received dialysis 07/19)   BGs:                                                                             Daily Weights: no                                            Daily Bath (soap & water /CHG): ___    Restraints: (no)  Violent or nonviolent  When restraints order renewal needed:___         Anticipatory Guidance / Discharge Planning  Dentures: Uppers []  Lowers [] , Hearing Aids R[]  L[] , Glasses []   Belongings in [employer] []   Support Person/Contact:   Tasks Pending:  Education Needed:  D/C to:

## 2023-10-07 NOTE — Progress Notes (Signed)
 Radiation Oncology Follow-up Clinic Note (10/07/2023)    Patient identification and Diagnosis: Shane Pearson is a 63 y.o. male who was recently found to have diffusely metastatic disease with unknown primary after presenting to the hospital on 09/28/23.        Interval History:   History of Present Illness    [name] reports that he is feeling a little better than previous. His pain in his shoulder and leg continue to improve. He has no further concerns or questions at this time, and would like to continue with current plan. Awaiting biopsy results.       Medications:   Current Facility-Administered Medications   Medication Dose Route Frequency    acetaminophen  (TYLENOL ) tablet 1,000 mg  1,000 mg Oral Q8H PRN    alteplase  (CATHFLO, ACTIVASE ) injection 0-1 mg  0-1 mg Intracatheter PRN    And    alteplase  (CATHFLO, ACTIVASE ) injection 0-1 mg  0-1 mg Intracatheter PRN    heparin  1,000 units/mL bolus from syringe 1,000 units  1,000 units Dialysis PRN    heparin  1,000 units/mL syringe (for Dialysis)  500 Units/hr Dialysis Continuous PRN    sodium chloride  0.9 % FLUSH REQUIRED IF PATIENT HAS IV  0-500 mL/hr Intravenous PRN    dextrose  5 % FLUSH REQUIRED IF PATIENT HAS IV  0-500 mL/hr Intravenous PRN    lidocaine  HCL 1 % injection 0.1 mL  0.1 mL Subcutaneous PRN    heparin  (porcine) SQ injection 5,000 units  5,000 units Subcutaneous TID    Vancomycin  - Pharmacist to Dose (admitted patients only)   Intravenous Pharmacist Managed Medication    ceFEPIme  (MAXIPIME ) 1,000 mg in dextrose  5% 58 mL IVPB  1,000 mg Intravenous Q24H    metroNIDAZOLE  (FLAGYL ) tablet 500 mg  500 mg Oral 3 times per day    polyethylene glycol (GLYCOLAX ,MIRALAX ) powder 17 g  17 g Oral Daily    patiromer  (VELTASSA ) packet 16.8 g  16.8 g Oral Daily @ 1400    magnesium  oxide (MAG-OX) tablet 400 mg  400 mg Oral Daily    heparin  (porcine) 1,000 units/mL injection 0-5,000 units  0-5 mL Intracatheter PRN    heparin  (porcine) 1,000 units/mL injection 0-5,000 units   0-5 mL Intracatheter PRN    sodium chloride  0.9 % FLUSH REQUIRED IF PATIENT HAS IV  0-500 mL/hr Intravenous PRN    dextrose  5 % FLUSH REQUIRED IF PATIENT HAS IV  0-500 mL/hr Intravenous PRN    atorvastatin  (LIPITOR) tablet 20 mg  20 mg Oral Daily with dinner    B complex-vitamin C-folic acid  (NEPHRO-VITE) tablet 1 tablet  1 tablet Oral Daily    calcium -vitamin D  (OSCAL-500) 500mg -200unit per tablet 1 tablet  1 tablet Oral BID    cinacalcet  (SENSIPAR ) tablet 30 mg  30 mg Oral Daily    fludrocortisone  (FLORINEF ) tablet 0.1 mg  0.1 mg Oral TID    fluticasone  (FLONASE ) 50 MCG/ACT nasal spray 1 spray  1 spray Nasal Daily    midodrine  (PROAMATINE ) tablet 15 mg  15 mg Oral TID    omeprazole  (PriLOSEC) capsule 40 mg  40 mg Oral QAM    Lidocaine  4 % patch 2 patch  2 patch Transdermal Q24H       Allergies:  Allergies[1]    Review of systems: As per HPI/Interval History. Otherwise negative.     Physical Examination:   Vitals:    10/07/23 1210   BP: 107/50   Pulse: 80   Resp: 16   Temp: 36.2 C (  97.2 F)   Weight:    Height:        Performance Status: Score-60- Required occasional assistance but is able to care for most of  His needs.    Physical Exam  Constitutional:       General: He is not in acute distress.  HENT:      Head: Normocephalic and atraumatic.      Comments: Bandage in place over R temple without evidence of bleeding or drainage  Eyes:      General: No scleral icterus.        Right eye: No discharge.         Left eye: No discharge.   Cardiovascular:      Rate and Rhythm: Normal rate.   Pulmonary:      Effort: Pulmonary effort is normal. No respiratory distress.   Abdominal:      General: There is no distension.   Neurological:      Mental Status: He is alert.   Psychiatric:         Mood and Affect: Mood normal.       Physical Exam       Results         Assessment: Shane Pearson is a 63 y.o. male who was recently found to have diffusely metastatic disease with unknown primary after presenting to the hospital on  09/28/23. Biopsy results from L spine still pending. He reports improvement in pain. He would still like to wait for bx results before deciding next steps.    Assessment & Plan       Plan:  Pending bx results -- can offer palliative RT        Earlie Molt, MD  PGY-2 Resident  Radiation Oncology  10/07/2023    The patient was also seen and evaluated by Dr. Virgia. Formal attending attestation to follow.          [1]   Allergies  Allergen Reactions    Penicillins Swelling     Face    Coconut Flavoring Agent (Non-Screening) Nausea And Vomiting     All sources coconut (including raw) --causes regurgitation     Amoxicillin Rash    Baclofen Other (See Comments)     confusion    No Known Latex Allergy

## 2023-10-07 NOTE — Progress Notes (Addendum)
 Treatment        Discharge Recommendations:  Discharge to Planned Living Arrangement with Caregiver Supervision for ADLs and IADLs. Patient's ADL skills are not a barrier to discharge.  Patient will remain on OT caseload to address vision deficits and maximize independence with ADLs. Recommend Outpatient OT for vision     OT need to see patient prior to discharge: No    Equipment Recommendations: Rolling walker      Additional justification: None    OT Hospital Stay Recommendations: Encourage participation with ADLs, Toilet transfer status - SBA with RW, and OOB for meals        OT Referral Recommendations: Physical Therapy and Social Work    SUBJECTIVE and OBJECTIVE:  Precautions:   Fall Precautions: General fall precautions  L/D/A: IV lines  Activity order: Activity as tolerated  Patient wearing mask: No  Writer wearing PPE including: Gloves  Vitals: Vital signs stable  Subjective:  I got myself out of bed on my own and walked myself to the bathroom without help        Pain:   0  Intervention: no intervention needed    TREATMENT  Cognition: No deficit noted    Vision: Impaired and Wears corrective lenses.  Pt reports no double vision during this session.  Stating double vision is present ~50% of the time.  VOM intact.    Perception: Intact    UE Function:   ROM:   RUE: Near full     LUE: Near full      Strength:   RUE: 4/5     LUE: 4/5      Sensation: Intact   Coordination: Intact    Functional mobility:  Balance: Tested  Static sitting: Good, unsupported  Dynamic sitting: Good, unsupported  Static standing: Good,   Dynamic standing: Fair,     Bed Mobility: Not tested Pt received seated EOB.         Functional Transfers: Tested  Sit to stand: modified independence, Rolling walker  Stand to sit: modified independence, Rolling walker  Toilet transfer: modified independence, Rolling walker   Functional mobility: SBA, Rolling walker    ADL Assessment: ADLs completed/ simulated with level of assist as dictated  below:   Grooming: modified independence   UB Dressing: modified independence   LB Dressing: modified independence, with use of figure four method to dress lower body       ASSESSMENT and PLAN    Assessment: Patient making progress, continue with POC    Frequency: 2-3x/week    Patient Education: role of OT, plan of care, discharge recommendations, progress, OT goals, call bell use, ADL and/or IADLs modification/compensatory strategies, equipment recommendations, and energy conservation and/or work simplification techniques   Learners: patient   Readiness: acceptance   Method: explanation   Response: verbalizes understanding    Multidisciplinary Communication:  patient, RN        OCCUPATIONAL THERAPY PROVIDER   Electronically Signed By:   Rosaline DELENA Lee, OT    Please contact the OT via Secure Chat to: [facility] Occupational [facility] with any questions/concerns and/or update requests.    Timed Calculations:  Timed Codes:  25 minutes  Untimed Codes: 0  Unbilled Time: 0  Total Time:  25 minutes

## 2023-10-07 NOTE — Progress Notes (Signed)
 10/07/23 1600   [facility] Patient Class Review   Patient Class Review Inpatient     Patient class effective as of 09/30/23.     Damien Idol RN, BSN  [facility] Utilization Management  Ext [phone]  Secure chat

## 2023-10-07 NOTE — Progress Notes (Signed)
 Hospital Medicine Service Attending Progress Note    Significant Events/Subjective:   - VSS Overnight  - No acute overnight events    - Feels okay this morning. States he has a poor appetite but is trying to eat. Denies abdominal pain or nausea.  - States he feels much improved since arrival. States after IV antibiotics, his diffuse back pain and fatigue have improved  - Reviewed imaging and concern for malignancy/cancer. Reviewed biopsy results are still pending  - States for the past 3-4 weeks he has had blurry vision and difficult with depth perception. Saw his eye doctor and got updated prescription but no improvement. Reviewed he is ordered for MR head.     Objective:     Physical Exam  Temp:  [36.2 C (97.2 F)-36.9 C (98.4 F)] 36.3 C (97.3 F)  Heart Rate:  [79-89] 79  Resp:  [16-18] 16  BP: (96-164)/(52-87) 96/53       Constitutional: Sitting up in chair with computer on table working, talkative, no acute distress, alert  CV: Regular rate, regular rhythm, no murmur/rub/gallops, no pedal edema  Resp: Normal work of breathing on room air, continue to see improved aeration bilaterally without adventitious lung sounds  GI: Abdomen soft, nontender, somewhat distended, no organomegaly  GU: No Foley  MSK: Able to move all extremities spontaneously, normal bulk  Skin: Warm, dry, intact  Neuro: AO x 4  Psych: Good insight, fair mood, reasonable    Recent Lab Studies:  Personally reviewed and notable for:    - BMP: BUN 47, Cr 9.23, Albumin 3.0  - LFT: ALT 11, AST 52, Alk Phos 199, Total BR 0.3  - CBC: WBC 11.7, H/H 9.6/34, PLT 479    Recent Labs   Lab 10/07/23  0217 10/06/23  0507 10/05/23  0548   WBC 11.7* 12.3* 12.2*   Hemoglobin 9.6* 9.6* 9.8*   Hematocrit 34* 33* 33*   Platelets 479* 463* 500*      Recent Labs   Lab 10/07/23  0217 10/06/23  0507 10/05/23  0548   Sodium 134 134 132*   Potassium 4.6 4.3 4.9   Chloride 96 95* 96   CO2 13* 21 19*   UN 47* 35* 58*   Creatinine 9.23* 7.62* 10.50*   Glucose 85 65 79    Calcium  8.4* 8.5* 8.8   Albumin 3.0* 3.1* 3.1*   Phosphorus 3.7 4.2 4.6*     Recent Labs   Lab 09/30/23  1943   INR 1.4*   Protime 15.7*       Recent Imaging Studies:  No results found.      Assessment:     Shane Pearson is a  63 y.o. male with ESRD (on HD TTS), hypotension, and lumbosacral spondylosis who presented to the ED for chest pain and left shoulder pain. Initial work up was notable for mild leukocytosis, chronic anemia, thrombocytosis, elevated NT-pro BNP, elevated but adynamic troponin, elevated AST and Alk Phos, and ESRD.  Initial chest x-ray demonstrating right sided opacity, concern for atelectasis and minimal right pleural effusion.  He was admitted for constitutional symptoms and workup initially concerning for occult infectious process, rheumatologic disorder or malignancy.  Further workup since admission is highly suggestible for metastatic disease of unclear primary with multiple cervical/thoracic/lumbar spine lesions with extension into paraspinal soft tissues, bilateral psoas lesions,  multiple nodal/intramuscular lesions on CT A/P all likely metastatic lesions s/p lumbar osseous lesion biopsy, pending pathology. Further, has b/l exudative pleural effusions s/p tap (  pending cytology).  Also found to have vertical diplopia and blurry vision s/p temporal artery biopsy for GCA with no signs for temporal arteritis with rheumatology/ophthalmology signing off. Currently, patient is clinically stable, pending further diagnostic results with pathology.      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  This was discussed with patient.    -CRP downtrending since broad-spectrum antibiotics (365> 338> 295> 273 -> 219)  -ESR is worsening since admission (121> 121> 124> 130 -> 130)  -Blood cultures 7/22 2300: Negative to date  -Neurosurgery signed off-will call back if concern  for cervical osteo persists and when the biopsy path returns, appreciate ongoing recommendations.    - Continue vanc/cefepime /flagyl  for broad spectrum coverage s/p IR biopsy in case of cervical osteo-will continue until pathology results return; will ask ID to approve for additional day given he has been on 5 days thus far with improvement in symptoms.   - Heme/onc following, appreciate ongoing recommendations.  For suspected malignancy of unclear primary; if PET scan inpatient possible? Will consider thyroid biopsy if spine pathology and pleural fluid is non-diagnostic  - Rad Onc consulted and pending patient biopsy results prior to pain intervention per patient preference  - Follow up MRI brain (ordered 7/22) and currently pending   -Pain control: 1000 mg Tylenol  scheduled 3 times daily.  Can transition to as needed, as minimal pain and has not needed any opiates since 7/22    Large right exudative pleural effusion s/p thoracentesis (7/22)  Moderate left pleural effusion  -Appears layered on CT  -No growth on Gram stain  -Nucleated cells 353, RBC 5380 with yellow cloudy fluid (not hemorrhagic)  -Cell count: Predominantly macrophages with many lymphocytes and segmented neutrophils  with few mesothelial cells. Atypical cells present, defer to cytology.  -Pending cytology, culture (no growth on anaerobic culture yet, aerobic culture was canceled by lab)    Vertical diplopia  Low concern for GCA, s/p temporal artery biopsy (7/25)  -Ophthalmology consulted and following  -Rheumatology was consulted at admission for concern of GCA considering blurry vision, s/p temporal artery biopsy with no signs of temporal arteritis.  Rheumatology signed off  -Wonder if there could be infiltration of cranial nerves and if additional brain imaging with MRI would be warranted considering acute onset and setting of potential malignancy.  No obvious tumor seen on CT head  -ESR/CRP trend every 3 days -ESR significantly unchanged since  admission, CRP improving    Moderate refeeding syndrome risk  -BMP, mag, Phos every day x 3 days then reevaluate (7/24 - 7/26)  - No repletion's required thus far    Isolated alkaline phosphatase elevation  Could be from bone  -GGT within normal limits, this is from bone    Mild anion gap metabolic acidosis  Hyponatremia  Hypochloremia  Uremia  Hyperkaleima s/p temporizer, resolved  ESRD with HD (T/TH/Sat)  -Reassess after hemodialysis  - Nephrology following closely   - BMP every AM     Normocytic anemia  Likely secondary to ESRD  -Nephrology and holding Epogen    Subacute diarrhea  -Stool studies negative     Additional concerns-  -Periodontal disease seen on CT-dental evaluation outpatient  -Multiple pulmonary nodules 4 mm seen on CT-considering workup for malignancy, may not need to evaluate these further outpatient    Chronic problems-  Chronic hypotension-continue home midodrine  home fludrocortisone   GERD- continue home Prilosec 20 mg  Hyperlipidemia -  Continue atorvastatin   Metabolic bone disease-continue Cinacalcet   Is on ASA 81mg  dating back to 2020 per chart review -unclear reasoning. If for primary prevention, will need to discontinue permanently. Will assess further with patient - per prior doc, does not appear to have hx of CVA/MI.  Currently being held in preparation for IR biopsy and GCA biopsy  Allergic rhinitis - Continue flonase     DVT PPx: Heparin  subcu    Emergency Contact: Shane Pearson (spouse/HCP) [phone]   Code Status: Full code    Discharge Planning:   MRDD: Unclear  Discharge Criteria/Barriers to Discharge: Further workup of potential malignancy including biopsy/outpatient connection, rule out cervical osteo  PT and/or OT Recommendations/Discharge to: PT recommending to home with PT.  OT recommending ongoing support for ADLs.  OT notes he had much improvement while hospitalized.   Appointments needed with: PCP, heme-onc, neurosurg, ophthalmology    Case discussed with RN, SW, CC and  APP.    Izyk Marty S. Tobie, MD  [facility] Medicine Attending  [place]  [place] [id number]

## 2023-10-07 NOTE — Progress Notes (Signed)
 Report Given To  Marthena POUR., RN      Descriptive Sentence / Reason for Admission   Team:    Presented with:intermittent (L) shoulder pain x 2 weeks. Inability to ambulate x 1 week r/t (R) leg pain. + muscle aches + fatigue + weight loss + night sweats + poor PO intake  Relevant PMHx:ESRD HD (T,Th,S) Osteoarthritis, Hypotension  Ik:ipjmmyzj           Active Issues / Relevant Events   Shift Event (last 24 hours):  7/28D: Patient experiencing back pain, given tylenol  and hot packs with positive effect. Ambulating to bathroom with standby assist.   Precautions:        AOX:X4  Mobility: Cane  Pills: Whole  Meals: NPO   Continent: (yes/no)  IV:  g22 right hand   (drsg due__)   (Line cap change due__)  IVF/gtts:     Drains/tubes/O2/Bipap/CPAP:     Patient Behaviors:Calm           To Do List  VS:Q4  Tele: yes  Sticker Change Daily []   Wound Care:  Labs/Tests: ESRD, dialysis T/Th/Sat (received dialysis 07/19)   BGs:                                                                             Daily Weights: no                                            Daily Bath (soap & water /CHG): ___    Restraints: (no)  Violent or nonviolent  When restraints order renewal needed:___         Anticipatory Guidance / Discharge Planning  Dentures: Uppers []  Lowers [] , Hearing Aids R[]  L[] , Glasses []   Belongings in Safe/Cashiers Office []   Support Person/Contact:   Tasks Pending:  Education Needed:                                                                          D/C to:

## 2023-10-07 NOTE — Progress Notes (Signed)
 Nephrology Progress Note   LOS: 7 days     Interval History  No acute events overnight  HD 7/26, required TPA - receives heparin  with treatment outpatient, added to inpatient orders to help with clotting    Subjective   Patient seen & examined at bedside for extra HD needs. Chart reviewed for events, lab, and radiology data.   Denies SOB, CP, N/V/D    Objective      Current Vitals Vitals Range (24 Hours)   BP 96/53 (BP Location: Right leg)   Pulse 79   Temp 36.3 C (97.3 F) (Temporal)   Resp 16   Ht 1.651 m (5' 5)   Wt 79.8 kg (175 lb 14.8 oz)   SpO2 96%   BMI 29.28 kg/m  BP: (96-164)/(52-87)   Temp:  [36.2 C (97.2 F)-36.9 C (98.4 F)]   Temp src: Temporal (07/28 0744)  Heart Rate:  [79-89]   Resp:  [16-18]   SpO2:  [94 %-96 %]      I/Os Weight   I/O last 3 completed shifts:  07/27 0700 - 07/28 0659  In: 120 (1.5 mL/kg) [P.O.:120]  Out: - (0 mL/kg)   Net: 120  Weight: 79.8 kg  Last 4 Weights    10/01/23 0810 10/01/23 2003 10/05/23 1412   Weight: 79.9 kg (176 lb 2.4 oz) 79.8 kg (176 lb) 79.8 kg (175 lb 14.8 oz)        Physical Exam:  Constitutional:  General: Not in acute distress, sitting in chair, pleasant  CVS:   Rate and Rhythm: Normal rate and rhythm  Other: no peripheral edema bilaterally  Pulmonary:   Effort: No respiratory distress, easy WOB, conversational  Breath sounds: CTAB anteriorly   MSK: Warm and well perfused  Neuro:   General: No focal deficit noted  Mental status: Alert and oriented x3  Psych:   Mood and Affect: Mood normal.      Behavior: Behavior normal.   Access: HDTC, dressing CDI    Recent Labs   Lab 10/07/23  0217 10/06/23  0507 10/05/23  0548   Sodium 134 134 132*   Potassium 4.6 4.3 4.9   Chloride 96 95* 96   CO2 13* 21 19*   Anion Gap 25* 18* 17*   UN 47* 35* 58*   Creatinine 9.23* 7.62* 10.50*   Glucose 85 65 79   Calcium  8.4* 8.5* 8.8   Albumin 3.0* 3.1* 3.1*   Phosphorus 3.7 4.2 4.6*   Magnesium  1.8 1.8 1.8     No results found for: BKVQL  No results found for:  CMVQL  Aerobic bacterial pleural or thoracentesis fluid culture and stain   Date Value Ref Range Status   10/01/2023 Lab Cancel  Final     Bacterial Blood Culture   Date Value Ref Range Status   10/01/2023 .  Final   10/01/2023 .  Final        Recent Labs   Lab 10/07/23  0217 10/06/23  0507 10/05/23  0548   WBC 11.7* 12.3* 12.2*   Hemoglobin 9.6* 9.6* 9.8*   Hematocrit 34* 33* 33*   Platelets 479* 463* 500*         Component Value Date/Time    FER 650 (H) 09/29/2023 0327     No results for input(s): TACRR, SIR, CSAM in the last 168 hours.   No results for input(s): JOLINDA PLEVA, [place], [place], [place], [place], UOSMO in the last 168 hours.  No results for input(s):  UAPP, UCOL, UAGLU, KETONESU, USG, UBLD, UAPH, UPRO, UNITR, ULEU, URBC, UWBC, UMUC, [place], [place], SPECIFICGRAV, BLOODUA, PHUA, PROTEIN, NITRITEUA, LEUKESTERASE, GLUCOSESIE, KETONESIEM, PUSG, BLOODSIEMEN, PUAPH, PROTEINUA, PUNIT, LEUKOCYTESIE in the last 8760 hours.           No results found.     Current Medications  Scheduled Meds:   heparin  (porcine)  5,000 units Subcutaneous TID    ceFEPime  (MAXIPIME ) IV  1,000 mg Intravenous Q24H    metroNIDAZOLE   500 mg Oral 3 times per day    polyethylene glycol  17 g Oral Daily    patiromer   16.8 g Oral Daily @ 1400    magnesium  oxide  400 mg Oral Daily    atorvastatin   20 mg Oral Daily with dinner    B complex-vitamin C-folic acid   1 tablet Oral Daily    calcium -vitamin D   1 tablet Oral BID    cinacalcet   30 mg Oral Daily    fludrocortisone   0.1 mg Oral TID    fluticasone   1 spray Nasal Daily    midodrine   15 mg Oral TID    omeprazole   40 mg Oral QAM    Lidocaine   2 patch Transdermal Q24H     Continuous Infusions:   heparin  500 Units/hr (10/05/23 1638)    Vancomycin  - Pharmacist to Dose       PRN Meds:acetaminophen , alteplase  injection **AND** alteplase  injection, heparin , heparin , sodium chloride , dextrose , lidocaine  HCL, heparin   (porcine), heparin  (porcine), sodium chloride , dextrose         Assessment  Shane Pearson is a 63 y.o. Male with history significant for ESRD (on HD TTS), hypotension, and lumbosacral spondylosis who presented to the ED for pain and difficulty walking. Workup concerning for malignancy, considering mediastinal lymphadenopathy. Nephrology consulted for dialysis needs.     Plan  ESRD 2/2 HTN on HD TTS at CC :  Dose adjust medications to CrCl <10 mL/s  Continue nephrovite  Will dialyze as per schedule with the following prescription:    Active Dialysis Orders   Dialysis    Hemodialysis     Frequency: Every Tues,Thurs & Sat     Start Date/Time: 10/03/23 9147      Number of Occurrences: Until Specified     Order Questions:      Dialyzer F180      K/Ca (meq) 2K/2.5Ca      Na (meq) 135      Na Modeling None      Bicarb (meq) 30      Dialysate Temp (C) 35      BFR (mL/min) 400      DFR (mL/min) 1.5x BFR      Duration (hours) 4      EDW (kgs) TBD      Access CVC      Vital Signs Vitals signs at the start of treatment and every 30 minutes      Vital Signs Check temperature at the start of treatment and at the end of treatment      Monitoring May place patient on telemtry per nursing descretion for concerns regarding stability      Notify dialysis provider for Pulse Less Than 50      Notify dialysis provider for Pulse Greater Than 130      Notify dialysis provider for Temperature (Celsius) Above 38      Notify dialysis provider for Systolic Blood Pressure Below 90      Notify dialysis provider for Systolic Blood Pressure Above 819  Fluid removal (L) 0.5-3L , map over 65      Fluid removal instructions Decrease fluid removal goal by 500 ml  if blood pressure is decreasing or HR increasing. Reassess response every 15 minutes.      Fluid removal instructions May increase fluid removal goal by 500 ml if blood pressure and HR are stable.  Reassess response every 15 minutes.      Fluid removal instructions Decrease fluid removal to  minimum if call/hold parameters for BP and/or HR exceeded.  Reassess response every 15 minutes.      Fluid removal instructions If no BP/HR response in 15 minutes after notifying MD, give 200 ml isotonic saline.  Reassess response every 15 minutes.       Dialysis access:   [facility]    Dialysis medications:  Patients outpatient dialysis packet fully reviewed. Medications administered at outpatient dialysis have been ordered inpatient.     Electrolytes:  Sodium and potassium acceptable   Low K diet  Veltassa  16.8 g daily    CKD-BMD:  Hypocalcemia to 8.4, acceptable with hypoalbuminemia   PO4 Level: 3.7, improved with dialysis   - Calcimimetic: sensipar  30 mg daily     Acid-Base Disorders:   Will keep within desired goal range (serum bicarb 22-25) with RRT against 35 bicarb    Hemodynamics:   SBP range: 130-140's  Continue antihypertensive meds: midodrine  15 mg TID with meals   Will remove fluid with scheduled HD treatments     Anemia: Hemoglobin goal (chronically) 10-11.5 gm/dl  Hgb as above  No ESA indicated at this time   Transfusion of PRBC as needed per primary team    No urgent indications for dialysis today. Patient with no overt signs of fluid overload, labs fairly stable from nephrology standpoint. Next dialysis per schedule.     Remainder of management per primary team.  Thank you for allowing us  to participate in the care of this patient.    Lauraine Meager, NP  Nephrology Division  [facility]  10/07/2023 11:08 AM

## 2023-10-07 NOTE — Antimicrobial Stewardship (Signed)
 Antimicrobial Approval Request    The Antibiotic Approval program was contacted for approval of a restricted anti-infective.  Microbiological data and other considerations such as site of infection, antimicrobial efficacy, drug costs and dosing characteristics were reviewed.      Vancomycin  (after 72 hours) is being requested for Osteomyelitis, pending biopsy results.  Approval status: Approved by Antibiotic Stewardship Team  Duration: Vancomycin  (after 72 hours) is approved for up to a total of 2 days through 10/09/2023    The Antibiotic Approval process does not encompass examination of the patient, nor is it intended to suggest clinical diagnoses or treatment plans, but  rather to provide assistance in optimizing antimicrobial therapy once a provider team has considered or established a diagnosis and initiated therapy.  Any recommendations for selection or dosing of specific antibiotics  should be considered in conjunction with all other patient factors.  Antibiotic Approval is NOT a substitute for Infectious Diseases consultation.    Corean Crisp, PharmD

## 2023-10-08 ENCOUNTER — Inpatient Hospital Stay

## 2023-10-08 DIAGNOSIS — N189 Chronic kidney disease, unspecified: Secondary | ICD-10-CM

## 2023-10-08 DIAGNOSIS — D631 Anemia in chronic kidney disease: Secondary | ICD-10-CM

## 2023-10-08 LAB — MAGNESIUM: Magnesium: 1.8 mg/dL (ref 1.6–2.5)

## 2023-10-08 LAB — PHOSPHORUS: Phosphorus: 3.4 mg/dL (ref 2.7–4.5)

## 2023-10-08 LAB — BASIC METABOLIC PANEL
Anion Gap: 19 — ABNORMAL HIGH (ref 7–16)
CO2: 19 mmol/L — ABNORMAL LOW (ref 20–28)
Calcium: 8.5 mg/dL — ABNORMAL LOW (ref 8.6–10.2)
Chloride: 96 mmol/L (ref 96–108)
Creatinine: 10.6 mg/dL — ABNORMAL HIGH (ref 0.67–1.17)
Glucose: 92 mg/dL (ref 60–99)
Lab: 56 mg/dL — ABNORMAL HIGH (ref 6–20)
Potassium: 3.8 mmol/L (ref 3.3–5.1)
Sodium: 134 mmol/L (ref 133–145)
eGFR BY CREAT: 5 — AB

## 2023-10-08 LAB — DIFF MANUAL
Diff Based On: 114 {cells}
Metamyelocyte %: 1 % (ref 0–1)
Myelocyte %: 1 % — ABNORMAL HIGH

## 2023-10-08 LAB — HEPATIC FUNCTION PANEL
ALT: 7 U/L (ref 0–50)
AST: 45 U/L (ref 0–50)
Albumin: 2.9 g/dL — ABNORMAL LOW (ref 3.5–5.2)
Alk Phos: 200 U/L — ABNORMAL HIGH (ref 40–130)
Bilirubin,Direct: <0.2 mg/dL (ref 0.0–0.3)
Bilirubin,Total: 0.2 mg/dL (ref 0.0–1.2)
Total Protein: 5.6 g/dL — ABNORMAL LOW (ref 6.3–7.7)

## 2023-10-08 LAB — CBC AND DIFFERENTIAL
Baso # K/uL: 0.2 THOU/uL (ref 0.0–0.2)
Eos # K/uL: 0.3 THOU/uL (ref 0.0–0.5)
Hematocrit: 31 % — ABNORMAL LOW (ref 37–52)
Hemoglobin: 9.3 g/dL — ABNORMAL LOW (ref 12.0–17.0)
Lymph # K/uL: 0.5 THOU/uL — ABNORMAL LOW (ref 1.0–5.0)
MCV: 83 fL (ref 75–100)
Mono # K/uL: 1 THOU/uL (ref 0.1–1.0)
Neut # K/uL: 10.6 THOU/uL — ABNORMAL HIGH (ref 1.5–6.5)
Nucl RBC # K/uL: 0.1 THOU/uL (ref 0.0–0.1)
Nucl RBC %: 0.5 /100{WBCs} — ABNORMAL HIGH (ref 0.0–0.2)
Platelets: 494 THOU/uL — ABNORMAL HIGH (ref 150–450)
RBC: 3.7 MIL/uL — ABNORMAL LOW (ref 4.0–6.0)
RDW: 20.5 % — ABNORMAL HIGH (ref 0.0–15.0)
Seg Neut %: 81.5 %
WBC: 12.9 THOU/uL — ABNORMAL HIGH (ref 3.5–11.0)

## 2023-10-08 LAB — VANCOMYCIN, RANDOM: Vancomycin Level: 18.5 ug/mL

## 2023-10-08 LAB — SURGICAL PATHOLOGY

## 2023-10-08 LAB — MEDICAL CYTOLOGY

## 2023-10-08 MED ORDER — VANCOMYCIN HCL IN NACL 750 MG/150 ML IV SOLN *WRAPPED*
750.0000 mg | Freq: Once | INTRAVENOUS | Status: AC
Start: 2023-10-08 — End: 2023-10-08
  Administered 2023-10-08: 750 mg via INTRAVENOUS
  Filled 2023-10-08: qty 150

## 2023-10-08 MED ORDER — LACTOBACILLUS RHAMNOSUS (GG) PO CAPS *I*
1.0000 | ORAL_CAPSULE | Freq: Every day | ORAL | Status: DC
Start: 2023-10-08 — End: 2023-12-07
  Administered 2023-10-08 – 2023-10-10 (×3): 1 via ORAL
  Filled 2023-10-08 (×4): qty 1

## 2023-10-08 MED ORDER — VANCOMYCIN HCL IN NACL 1250 MG/275 ML IV SOLN *I*
1250.0000 mg | Freq: Once | INTRAVENOUS | Status: DC
Start: 2023-10-08 — End: 2023-10-08

## 2023-10-08 NOTE — Plan of Care (Signed)
 Problem: Safety  Goal: Patient will remain free of falls  Outcome: Maintaining     Problem: Pain/Comfort  Goal: Patient's pain or discomfort is manageable  Outcome: Maintaining     Problem: Mobility  Goal: Patient's functional status is maintained or improved  Outcome: Maintaining     Problem: Psychosocial  Goal: Demonstrates ability to cope with illness  Outcome: Maintaining     Problem: Cognitive function  Goal: Vital signs will be within normal limits  Outcome: Maintaining

## 2023-10-08 NOTE — Progress Notes (Signed)
 Report Attempted x2, was not able to get a hold of primary nurse.   Left CVC used during HD without any difficulties, running at 400 BFR. Patient tolerated 4 hours of HD Treatment. After 3 hours of HD tx, pt BP low, pt didn't feel good, UF off per Nephrology NP. Removed Net Fluid Vol of  1.3 L. Left CVC dressing changed per order. Pt had asymptomatic hypotension during tx, MAP used per order during HD,  VS stable post HD. No complaints prior to HD discharge.     Descriptive Sentence / Reason for Admission   Duration of Treatment (minutes): 240 min  Complications: asymptomatic hypotension, UF off last 1 hour of tx.     Active Issues / Relevant Events   Review of medications administered  such as routine meds, antibiotics, and heparin : see MAR  Vital Signs: see Flowsheet    To Do List  Medications still needing administration: n/a  Dressing change due : 10/15/2023    Anticipatory Guidance / Discharge Planning  Date/Time of Next HD Treatment: TBD by Nephrology.  Please weigh patient daily in order to accurately monitor fluid gains.     Thank you.

## 2023-10-08 NOTE — Progress Notes (Signed)
 Hospital Medicine Service Attending Progress Note    Significant Events/Subjective:   - VSS Overnight  - No acute overnight events    - Feels well at HD. Feels more alert and able to complete some of his work.  - Notes ate more than previous and appetite is slowly returning  - Denies any pain  - Reviewed with patient and wife that we are awaiting biopsy results still    Objective:     Physical Exam  Temp:  [36.2 C (97.2 F)-36.8 C (98.2 F)] 36.8 C (98.2 F)  Heart Rate:  [77-80] 77  Resp:  [12-18] 12  BP: (96-151)/(50-88) 124/60       Constitutional: Sitting up in chair with computer on table working, talkative, no acute distress, alert  CV: Regular rate, regular rhythm, no murmur/rub/gallops, no pedal edema  Resp: Normal work of breathing on room air, continue to see improved aeration bilaterally without adventitious lung sounds  GI: Abdomen soft, nontender, somewhat distended, no organomegaly  GU: No Foley  MSK: Able to move all extremities spontaneously, normal bulk  Skin: Warm, dry, intact  Neuro: AO x 4  Psych: Good insight, fair mood, reasonable    Recent Lab Studies:  Personally reviewed and notable for:    - BMP: BUN 56, Cr 10.60, K 3.8  - CBC: WBC 12.9, H/H 9.3/31, PLT 494    Recent Labs   Lab 10/07/23  0217 10/06/23  0507 10/05/23  0548   WBC 11.7* 12.3* 12.2*   Hemoglobin 9.6* 9.6* 9.8*   Hematocrit 34* 33* 33*   Platelets 479* 463* 500*      Recent Labs   Lab 10/07/23  0217 10/06/23  0507 10/05/23  0548   Sodium 134 134 132*   Potassium 4.6 4.3 4.9   Chloride 96 95* 96   CO2 13* 21 19*   UN 47* 35* 58*   Creatinine 9.23* 7.62* 10.50*   Glucose 85 65 79   Calcium  8.4* 8.5* 8.8   Albumin 3.0* 3.1* 3.1*   Phosphorus 3.7 4.2 4.6*     No results for input(s): APTT, INR, PTT, PTI in the last 168 hours.      Recent Imaging Studies:  No results found.      Assessment:     Areon Cocuzza is a  63 y.o. male with ESRD (on HD TTS), hypotension, and lumbosacral spondylosis who presented to the ED for chest  pain and left shoulder pain. Initial work up was notable for mild leukocytosis, chronic anemia, thrombocytosis, elevated NT-pro BNP, elevated but adynamic troponin, elevated AST and Alk Phos, and ESRD.  Initial chest x-ray demonstrating right sided opacity, concern for atelectasis and minimal right pleural effusion.  He was admitted for constitutional symptoms and workup initially concerning for occult infectious process, rheumatologic disorder or malignancy.  Further workup since admission is highly suggestible for metastatic disease of unclear primary with multiple cervical/thoracic/lumbar spine lesions with extension into paraspinal soft tissues, bilateral psoas lesions,  multiple nodal/intramuscular lesions on CT A/P all likely metastatic lesions s/p lumbar osseous lesion biopsy, pending pathology. Further, has b/l exudative pleural effusions s/p tap (pending cytology).  Also found to have vertical diplopia and blurry vision s/p temporal artery biopsy for GCA with no signs for temporal arteritis with rheumatology/ophthalmology signing off. Currently, patient is clinically stable, pending further diagnostic results with pathology.      Plans:   Mediastinal lymphadenopathy, multiple osseous metastatic lesions throughout cervical/thoracic/lumbar spine  Constitutional symptoms  Concerning for underlying  malignancy.  This was discussed with patient.    -CRP downtrending since broad-spectrum antibiotics (365> 338> 295> 273 -> 219)  -ESR is worsening since admission (121> 121> 124> 130 -> 130)  -Blood cultures 7/22 2300: Negative to date  -Neurosurgery signed off-will call back if concern for cervical osteo persists and when the biopsy path returns, appreciate ongoing recommendations.    - Continue vanc/cefepime /flagyl  for broad spectrum coverage s/p IR biopsy in case of cervical osteo-will continue until pathology results return; will ask ID to approve for additional day given he has been on 5 days thus far with  improvement in symptoms.   - Heme/onc following, appreciate ongoing recommendations.  For suspected malignancy of unclear primary; if PET scan inpatient possible? Will consider thyroid biopsy if spine pathology and pleural fluid is non-diagnostic  - Rad Onc consulted and pending patient biopsy results prior to pain intervention per patient preference  - Follow up MRI brain (ordered 7/22) and currently pending; will call Radiolgy to ensure order is not lost  -Pain control: 1000 mg Tylenol  scheduled 3 times daily.  Can transition to as needed, as minimal pain and has not needed any opiates since 7/22    Large right exudative pleural effusion s/p thoracentesis (7/22)  Moderate left pleural effusion  -Appears layered on CT  -No growth on Gram stain  -Nucleated cells 353, RBC 5380 with yellow cloudy fluid (not hemorrhagic)  -Cell count: Predominantly macrophages with many lymphocytes and segmented neutrophils  with few mesothelial cells. Atypical cells present, defer to cytology.  -Pending cytology, culture (no growth on anaerobic culture yet, aerobic culture was canceled by lab)    Vertical diplopia  Low concern for GCA, s/p temporal artery biopsy (7/25)  -Ophthalmology consulted and following  -Rheumatology was consulted at admission for concern of GCA considering blurry vision, s/p temporal artery biopsy with no signs of temporal arteritis.  Rheumatology signed off  -Wonder if there could be infiltration of cranial nerves and if additional brain imaging with MRI would be warranted considering acute onset and setting of potential malignancy.  No obvious tumor seen on CT head  -ESR/CRP trend every 3 days -ESR significantly unchanged since admission, CRP improving    Moderate refeeding syndrome risk  -BMP, mag, Phos every day x 3 days then reevaluate (7/24 - 7/26)  - No repletion's required thus far    Isolated alkaline phosphatase elevation  Could be from bone  -GGT within normal limits, this is from bone    Mild anion  gap metabolic acidosis  Hyponatremia  Hypochloremia  Uremia  Hyperkaleima s/p temporizer, resolved  ESRD with HD (T/TH/Sat)  -Reassess after hemodialysis  - Nephrology following closely   - BMP every AM     Normocytic anemia  Likely secondary to ESRD  -Nephrology and holding Epogen    Subacute diarrhea  -Stool studies negative     Additional concerns-  -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 If for primary prevention, will need to discontinue permanently. Will assess further with patient - per prior doc, does not appear to have hx of CVA/MI.  Currently being held in preparation for  IR biopsy and GCA biopsy  Allergic rhinitis - Continue flonase     DVT PPx: Heparin  subcu    Emergency Contact: [name] (spouse/HCP) [phone]   Code Status: Full code    Discharge Planning:   MRDD: Unclear  Discharge Criteria/Barriers to Discharge: Further workup of potential malignancy including biopsy/outpatient connection, rule out cervical osteo  PT and/or OT Recommendations/Discharge to: PT recommending to home with PT.  OT recommending ongoing support for ADLs.  OT notes he had much improvement while hospitalized.   Appointments needed with: PCP, heme-onc, neurosurg, ophthalmology    Case discussed with RN, SW, CC and APP.    Niki Payment S. Tobie, MD  [facility] Attending  [place] [facility] [place]  [place] [id number]

## 2023-10-08 NOTE — Progress Notes (Signed)
 Report Given To  Dolan HAMS  RN      Descriptive Sentence / Reason for Admission   Team:    Presented with:intermittent (L) shoulder pain x 2 weeks. Inability to ambulate x 1 week r/t (R) leg pain. + muscle aches + fatigue + weight loss + night sweats + poor PO intake  Relevant PMHx:ESRD HD (T,Th,S) Osteoarthritis, Hypotension  Ik:ipjmmyzj           Active Issues / Relevant Events   Shift Event (last 24 hours):  7/23 D: Labs sent, soft BP throughout shift provider aware. Worked with PT, OOB to chair, potassium trended down currently 5.0. C/o blurred vision to provider, NPO at midnight possible biopsy tomorrow. More PT work up for discharge.   7/23 N BP on soft side, midodrine  and florinef  given. Tele discontinued by provider Brad prior to MRI. Patient trasported to MRI at [facility] and after MRI will transport directly to hemodialysis. Morning labs will be collected by HD nurse Mont Bench RN. Provider Brad Broody PA is aware and will reorder tele once the patient in hemodialysis.  7/24 D12: MRI, then HD only dialyzed for 3 hours, IR lumbar biopsy completed today, however IR called and not all needed specimens collected. Pt to go to IR for another spine biopsy 7/25.   7/25 D: Biopsy complete. VS remain stable throughout day. Full bath done by pt's wife. NPO resumes @ midnight.   7/26D: Patient cooperative with care and medications. Patient dialyzed today. Ambulated to the bathroom with RW and standby assist, reports x2 BM.   7/27D: No acute event, patient pleasant and cooperative with care. Ambulating in hallway with RW. Tolerating ABX well. MRI of head ordered.   7/28D: Patient experiencing back pain, given tylenol  and hot packs with positive effect. Ambulating to bathroom with standby assist.   7/28N: patient has a quiet night. No complain. Lab labels printed placed into chart. Report to Dialysis nurse this AM.   Precautions:        AOX:X4  Mobility: Cane  Pills: Whole  Meals: NPO   Continent: (yes/no)  IV:  g22  right hand   (drsg due__)   (Line cap change due__)  IVF/gtts:     Drains/tubes/O2/Bipap/CPAP:     Patient Behaviors:Calm           To Do List  VS:Q4  Tele: yes  Sticker Change Daily []   Wound Care:  Labs/Tests: ESRD, dialysis T/Th/Sat (received dialysis 07/19)   BGs:                                                                             Daily Weights: no                                            Daily Bath (soap & water /CHG): ___    Restraints: (no)  Violent or nonviolent  When restraints order renewal needed:___         Anticipatory Guidance / Discharge Planning  Dentures: Uppers []  Lowers [] , Hearing Aids R[]  L[] , Glasses []   Belongings in Safe/Cashiers Office []   Support Person/Contact:   Tasks Pending:  Education Needed:                                                                          D/C to:

## 2023-10-08 NOTE — Progress Notes (Signed)
 General Nephrology Progress Note:   LOS: 8 days         Subjective    I personally saw Shane Pearson while the patient was on hemodialysis.         Objective      Vitals:    10/08/23 0740 10/08/23 0800 10/08/23 0830 10/08/23 0900   BP: (!) 85/58 (!) 84/56 (!) 88/55 (!) 86/57   BP Location:       Pulse:       Resp: 16 16 16 16    Temp:       TempSrc:       SpO2: 95% 97% 99% 99%   Weight:       Height:           General: currently on dialysis  , Lying in bed, appears comfortable   Cardiovascular: S1, S2, Regular   Pulmonary: CTA bilaterally, there is no wheezing, rales or rhonchi   Ext: There is no LE edema   Neurological: Sleeping     Labs:  Recent Labs   Lab 10/08/23  0743 10/07/23  0217 10/06/23  0507   Sodium 134 134 134   Potassium 3.8 4.6 4.3   Chloride 96 96 95*   CO2 19* 13* 21   Anion Gap 19* 25* 18*   UN 56* 47* 35*   Creatinine 10.60* 9.23* 7.62*   Glucose 92 85 65   Calcium  8.5* 8.4* 8.5*   Phosphorus 3.4 3.7 4.2   Magnesium  1.8 1.8 1.8     Recent Labs   Lab 10/08/23  0743 10/07/23  0217 10/06/23  0507   WBC 12.9* 11.7* 12.3*   Hemoglobin 9.3* 9.6* 9.6*   Hematocrit 31* 34* 33*   Platelets 494* 479* 463*     Current Medications:  Scheduled Meds:   vancomycin   750 mg Intravenous Once    heparin  (porcine)  5,000 units Subcutaneous TID    ceFEPime  (MAXIPIME ) IV  1,000 mg Intravenous Q24H    metroNIDAZOLE   500 mg Oral 3 times per day    polyethylene glycol  17 g Oral Daily    patiromer   16.8 g Oral Daily @ 1400    magnesium  oxide  400 mg Oral Daily    atorvastatin   20 mg Oral Daily with dinner    B complex-vitamin C-folic acid   1 tablet Oral Daily    calcium -vitamin D   1 tablet Oral BID    cinacalcet   30 mg Oral Daily    fludrocortisone   0.1 mg Oral TID    fluticasone   1 spray Nasal Daily    midodrine   15 mg Oral TID    omeprazole   40 mg Oral QAM    Lidocaine   2 patch Transdermal Q24H     Continuous Infusions:   heparin  500 Units/hr (10/08/23 0740)    Vancomycin  - Pharmacist to Dose       PRN  Meds:.acetaminophen , alteplase  injection **AND** alteplase  injection, heparin , heparin , sodium chloride , dextrose , lidocaine  HCL, heparin  (porcine), heparin  (porcine), sodium chloride , dextrose       Assessment   Shane Pearson is a 63 y.o. 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     Plan   1. CKD Stage 6/ESRD on HD TTS  -  Patient was seen by me personally during the dialysis treatment, the patient is currently on dialysis and tolerating treatment well, continue current treatment as below  Active Dialysis Orders   Dialysis    Hemodialysis     Frequency: Every Tues,Thurs & Sat     Start Date/Time: 10/08/23 0933      Number of Occurrences: Until Specified     Order Questions:      Dialyzer F180      K/Ca (meq) 3K/2.5Ca      Na (meq) 135      Na Modeling None      Bicarb (meq) 30      Dialysate Temp (C) 35      BFR (mL/min) 400      DFR (mL/min) 1.5x BFR      Duration (hours) 4      EDW (kgs) TBD      Access CVC      Fluid removal (L) 0.5-3L , map over 65   - Continue Nephrovite as above  - Follow Chemistry  - Dose adjust meds to CrCL <10  -  Acid base balance is acceptable     2. CKD-MBD  - Calcium  is acceptable, recommend following on dialysis days   - Phosphorous is acceptable, recommend following on dialysis days   - Continue Sensipar  as above     3. Hyperkalemia   - Resolved   - Follow chemistry  - Recommend stopping Veltassa      4. Metabolic Acidosis   - Mild to moderate and improving with HD   -  Follow chemistry    5. Anemia  - Mild to moderate with Hgb below goal   - Holding ESA given concern for malignancy   - Follow CBC/Hgb levels   - Transfusion as per primary team     6. Volume/Blood Pressure  - Blood pressure is acceptable  - Continue UF with HD as above     7. Access  - HDTC      Please note that sections of this note has been dictated using speech recognition software.  Although reasonable attempts at proofreading and editing have been made, please excuse any errors that may have been missed.      Alm CANDIE Lipps D.O., DWAYNE, FASN  Associate Professor of Clinical Medicine  [facility]       Electronically Signed on 10/08/2023 at 10:08 AM

## 2023-10-08 NOTE — Progress Notes (Signed)
 Report Given To  Marthena POUR., RN      Descriptive Sentence / Reason for Admission   Team:    Presented with:intermittent (L) shoulder pain x 2 weeks. Inability to ambulate x 1 week r/t (R) leg pain. + muscle aches + fatigue + weight loss + night sweats + poor PO intake  Relevant PMHx:ESRD HD (T,Th,S) Osteoarthritis, Hypotension  Ik:ipjmmyzj           Active Issues / Relevant Events   Shift Event (last 24 hours):  7/27D: No acute event, patient pleasant and cooperative with care. Ambulating in hallway with RW. Tolerating ABX well. MRI of head ordered.   7/28D: Patient experiencing back pain, given tylenol  and hot packs with positive effect. Ambulating to bathroom with standby assist.   7/28N: patient has a quiet night. No complain. Lab labels printed placed into chart. Report to Dialysis nurse this AM.   7/29D: went to dialysis today. New 22G IV placed. Bathroom x2.   Precautions:        AOX:X4  Mobility: Cane  Pills: Whole  Meals: NPO   Continent: (yes/no)  IV:  g22 right hand   (drsg due__)   (Line cap change due__)  IVF/gtts:     Drains/tubes/O2/Bipap/CPAP:     Patient Behaviors:Calm           To Do List  VS:Q4  Tele: yes  Sticker Change Daily []   Daily CHG bath d/t HD cath  Labs/Tests: ESRD, dialysis T/Th/Sat (received dialysis 07/19)       Anticipatory Guidance / Discharge Planning  Dentures: Uppers []  Lowers [] , Hearing Aids R[]  L[] , Glasses []   Belongings in Safe/Cashiers Office []   Support Person/Contact:   Tasks Pending:  Education Needed:                                                                          D/C to:

## 2023-10-08 NOTE — Progress Notes (Addendum)
 Vancomycin  Hemodialysis- Pharmacist to Dose    Patient is on hemodialysis Tuesday;Thursday;Saturday and on Vancomycin  for Bone and Joint infection.    The last dose of vancomycin  1250 mg IV was given on 10/05/23 post-dialysis. Vancomycin  can be re-dosed when random vancomycin  concentration is less than 20 mcg/mL.     Random vancomycin  concentration is 18.5 mcg/mL    Assessment and Plan  Based on vancomycin  random concentration of 18.5 mcg/mL, vancomycin  concentration is within target range and a post dialysis vancomycin  dose will be ordered.  Will re-dose with vancomycin  750mg , IV x 1 at 1300   Next vancomycin  concentration to be drawn on 10/10/23 at 0600  Pharmacist will follow clinical course and will order vancomycin  concentrations as indicated.    Antimicrobial Stewardship Program has approved for 2 more days    For questions contact pharmacy at extension [phone]    Shane Pearson, PharmD

## 2023-10-08 NOTE — Progress Notes (Signed)
 10/08/23 0802   Treatment Attempt   Treatment Attempt Patient at dialysis when approached for PT, will re attempt as time allows.   Time Calculation   PT Unbilled Time 3     Jama Molt , PTA  (Please contact Physical Therapist on the treatment team via secure chat or via the secure group chat for your unit Physical Therapist with any questions. On weekends / holidays , please utilize the secure group chat , [facility] Physical Therapy 1st call, to contact PT. )

## 2023-10-09 ENCOUNTER — Inpatient Hospital Stay

## 2023-10-09 DIAGNOSIS — C649 Malignant neoplasm of unspecified kidney, except renal pelvis: Secondary | ICD-10-CM

## 2023-10-09 LAB — DIFF MANUAL
Bands %: 3 % (ref 0–10)
Diff Based On: 116 {cells}

## 2023-10-09 LAB — CBC AND DIFFERENTIAL
Baso # K/uL: 0.1 THOU/uL (ref 0.0–0.2)
Eos # K/uL: 0.1 THOU/uL (ref 0.0–0.5)
Hematocrit: 33 % — ABNORMAL LOW (ref 37–52)
Hemoglobin: 9.8 g/dL — ABNORMAL LOW (ref 12.0–17.0)
Lymph # K/uL: 1.4 THOU/uL (ref 1.0–5.0)
MCV: 82 fL (ref 75–100)
Mono # K/uL: 1.1 THOU/uL — ABNORMAL HIGH (ref 0.1–1.0)
Neut # K/uL: 11.6 THOU/uL — ABNORMAL HIGH (ref 1.5–6.5)
Nucl RBC # K/uL: 0.1 THOU/uL (ref 0.0–0.1)
Nucl RBC %: 0.6 /100{WBCs} — ABNORMAL HIGH (ref 0.0–0.2)
Platelets: 512 THOU/uL — ABNORMAL HIGH (ref 150–450)
RBC: 4 MIL/uL (ref 4.0–6.0)
RDW: 21.2 % — ABNORMAL HIGH (ref 0.0–15.0)
Seg Neut %: 77.5 %
WBC: 14.3 THOU/uL — ABNORMAL HIGH (ref 3.5–11.0)

## 2023-10-09 LAB — HEPATIC FUNCTION PANEL
ALT: 7 U/L (ref 0–50)
AST: 44 U/L (ref 0–50)
Albumin: 3 g/dL — ABNORMAL LOW (ref 3.5–5.2)
Alk Phos: 218 U/L — ABNORMAL HIGH (ref 40–130)
Bilirubin,Direct: <0.2 mg/dL (ref 0.0–0.3)
Bilirubin,Total: 0.3 mg/dL (ref 0.0–1.2)
Total Protein: 5.9 g/dL — ABNORMAL LOW (ref 6.3–7.7)

## 2023-10-09 LAB — BASIC METABOLIC PANEL
Anion Gap: 17 — ABNORMAL HIGH (ref 7–16)
CO2: 19 mmol/L — ABNORMAL LOW (ref 20–28)
Calcium: 8.5 mg/dL — ABNORMAL LOW (ref 8.6–10.2)
Chloride: 100 mmol/L (ref 96–108)
Creatinine: 7.05 mg/dL — ABNORMAL HIGH (ref 0.67–1.17)
Glucose: 101 mg/dL — ABNORMAL HIGH (ref 60–99)
Lab: 29 mg/dL — ABNORMAL HIGH (ref 6–20)
Potassium: 4.3 mmol/L (ref 3.3–5.1)
Sodium: 136 mmol/L (ref 133–145)
eGFR BY CREAT: 8 — AB

## 2023-10-09 LAB — PHOSPHORUS: Phosphorus: 2.9 mg/dL (ref 2.7–4.5)

## 2023-10-09 LAB — CRP: CRP: 233 mg/L — ABNORMAL HIGH (ref 0–8)

## 2023-10-09 LAB — SEDIMENTATION RATE, AUTOMATED: Sedimentation Rate: >=130 mm/h — AB (ref 0–20)

## 2023-10-09 LAB — MAGNESIUM: Magnesium: 1.8 mg/dL (ref 1.6–2.5)

## 2023-10-09 MED ORDER — WALKER MISC *A*
0 refills | Status: AC
Start: 2023-10-09 — End: ?

## 2023-10-09 MED ORDER — DICLOFENAC SODIUM 1 % EX GEL *I*
2.0000 g | Freq: Two times a day (BID) | CUTANEOUS | Status: DC
Start: 2023-10-09 — End: 2023-12-08
  Administered 2023-10-09: 2 g via TOPICAL
  Filled 2023-10-09: qty 100

## 2023-10-09 NOTE — Continuity of Care (Signed)
 [facility] has faxed demographics, script(s), and supporting documentation for Rolling Walker to [facility].    Julio Ohm RN, Acute Care Coordinator   [phone]   [facility] Acute Care Coordinator (WEEKENDS ONLY)

## 2023-10-09 NOTE — Progress Notes (Addendum)
 Nephrology Progress Note   LOS: 9 days     Interval History  No acute events overnight  HD yesterday with 1.3L UF    Subjective   Patient seen & examined at bedside for extra HD needs. Chart reviewed for events, lab, and radiology data.   Denies SOB, CP, offers no complaints    Objective      Current Vitals Vitals Range (24 Hours)   BP 121/78 (BP Location: Right leg)   Pulse 83   Temp 37.1 C (98.8 F) (Temporal)   Resp (!) 28   Ht 1.651 m (5' 5)   Wt 79.8 kg (175 lb 14.8 oz) Comment: 10/05/2023  SpO2 95%   BMI 29.28 kg/m  BP: (78-121)/(49-78)   Temp:  [36.1 C (97 F)-37.1 C (98.8 F)]   Temp src: Temporal (07/30 0818)  Heart Rate:  [83-86]   Resp:  [16-28]   SpO2:  [95 %-100 %]      I/Os Weight   I/O last 3 completed shifts:  07/29 0700 - 07/30 0659  In: 403.5 (5.1 mL/kg) [I.V.:403.5 (0.2 mL/kg/hr)]  Out: 3106 (38.9 mL/kg) [Other:3106]  Net: -2702.5  Weight: 79.8 kg  Last 4 Weights    10/05/23 1412 10/08/23 0730   Weight: 79.8 kg (175 lb 14.8 oz) 79.8 kg (175 lb 14.8 oz)        Physical Exam:  Constitutional:  General: Not in acute distress, sitting in chair, pleasant  CVS:   Rate and Rhythm: Normal rate and rhythm  Other: no peripheral edema bilaterally  Pulmonary:   Effort: No respiratory distress, easy WOB, conversational  Breath sounds: CTAB anteriorly   MSK: Warm and well perfused  Neuro:   General: No focal deficit noted  Mental status: Alert and oriented x3  Psych:   Mood and Affect: Mood normal.      Behavior: Behavior normal.   Access: HDTC, dressing CDI    Recent Labs   Lab 10/09/23  0536 10/08/23  0743 10/07/23  0217   Sodium 136 134 134   Potassium 4.3 3.8 4.6   Chloride 100 96 96   CO2 19* 19* 13*   Anion Gap 17* 19* 25*   UN 29* 56* 47*   Creatinine 7.05* 10.60* 9.23*   Glucose 101* 92 85   Calcium  8.5* 8.5* 8.4*   Albumin 3.0* 2.9* 3.0*   Phosphorus 2.9 3.4 3.7   Magnesium  1.8 1.8 1.8     No results found for: BKVQL  No results found for: CMVQL  Aerobic bacterial pleural or  thoracentesis fluid culture and stain   Date Value Ref Range Status   10/01/2023 Lab Cancel  Final     Bacterial Blood Culture   Date Value Ref Range Status   10/01/2023 .  Final   10/01/2023 .  Final        Recent Labs   Lab 10/09/23  0536 10/08/23  0743 10/07/23  0217   WBC 14.3* 12.9* 11.7*   Hemoglobin 9.8* 9.3* 9.6*   Hematocrit 33* 31* 34*   Platelets 512* 494* 479*         Component Value Date/Time    FER 650 (H) 09/29/2023 0327     No results for input(s): TACRR, SIR, CSAM in the last 168 hours.   No results for input(s): JOLINDA PLEVA, [place], [place], [place], [place], UOSMO in the last 168 hours.  No results for input(s): UAPP, UCOL, UAGLU, KETONESU, USG, UBLD, UAPH, UPRO, UNITR, ULEU, URBC, UWBC, [place],  GLUCOSEUA, KETONESUA, SPECIFICGRAV, BLOODUA, PHUA, PROTEIN, NITRITEUA, LEUKESTERASE, GLUCOSESIE, KETONESIEM, PUSG, BLOODSIEMEN, PUAPH, PROTEINUA, PUNIT, LEUKOCYTESIE in the last 8760 hours.           No results found.     Current Medications  Scheduled Meds:   lactobacillus rhamnosus (GG)  1 capsule Oral Daily    heparin  (porcine)  5,000 units Subcutaneous TID    polyethylene glycol  17 g Oral Daily    magnesium  oxide  400 mg Oral Daily    atorvastatin   20 mg Oral Daily with dinner    B complex-vitamin C-folic acid   1 tablet Oral Daily    calcium -vitamin D   1 tablet Oral BID    cinacalcet   30 mg Oral Daily    fludrocortisone   0.1 mg Oral TID    fluticasone   1 spray Nasal Daily    midodrine   15 mg Oral TID    omeprazole   40 mg Oral QAM    Lidocaine   2 patch Transdermal Q24H     Continuous Infusions:   heparin  500 Units/hr (10/08/23 0740)     PRN Meds:acetaminophen , alteplase  injection **AND** alteplase  injection, heparin , heparin , sodium chloride , dextrose , lidocaine  HCL, heparin  (porcine), heparin  (porcine), sodium chloride , dextrose         Assessment  Shane Pearson is a 64 y.o. Male with history significant for ESRD (on HD TTS), hypotension, and  lumbosacral spondylosis who presented to the ED for pain and difficulty walking. Workup concerning for malignancy, considering mediastinal lymphadenopathy. Nephrology consulted for dialysis needs.     Plan  ESRD 2/2 HTN on HD TTS at [facility] :  Dose adjust medications to CrCl <10 mL/s  Continue nephrovite  No urgent indications for dialysis today. Patient with no overt signs of fluid overload, labs fairly stable from nephrology standpoint. Dialysis tomorrow as per schedule       Dialysis access:   [facility]    Dialysis medications:  Patients outpatient dialysis packet fully reviewed. Medications administered at outpatient dialysis have been ordered inpatient.     Electrolytes:  Sodium and potassium acceptable   Low K diet    CKD-BMD:  Hypocalcemia stable, acceptable with hypoalbuminemia   Phos 2.9  - Calcimimetic: sensipar  30 mg daily     Acid-Base Disorders:   Will keep within desired goal range (serum bicarb 22-25) with RRT against 35 bicarb    Hemodynamics:   Hypotension, SBP 90s-120s today  Continue midodrine  15 mg TID with meals   Will remove fluid as needed/tolerated with scheduled HD treatments     Anemia: Hemoglobin goal (chronically) 10-11.5 gm/dl  Hgb below goal but stable  Holding ESA given concern for malignancy  Transfusion of PRBC as needed per primary team    Next dialysis 7/31 per schedule.     Remainder of management per primary team.  Thank you for allowing us  to participate in the care of this patient.    Lauraine Meager, NP  Nephrology Division  [facility]  10/09/2023 9:32 AM

## 2023-10-09 NOTE — Consults (Signed)
 Medical Nutrition Therapy- Follow Up    Patient Summary: 63 y.o. male with hx of ESRD (on HD TTS), hypotension, and lumbosacral spondylosis. Presented 09/28/2023 with chest pain and left shoulder pain. Initial work up was notable for mild leukocytosis, chronic anemia, thrombocytosis, elevated NT-pro BNP, elevated but adynamic troponin, elevated AST and Alk Phos, and ESRD. Workup since admission suggests metastatic disease of unclear primary with multiple cervical/thoracic/lumbar spine lesions with extension into paraspinal soft tissues, bilateral psoas lesions, multiple nodal/intramuscular lesions on CT A/P all likely metastatic lesions s/p lumbar osseous lesion biopsy, pending pathology.     Interval Hx: MRI brain pending. Underwent thoracentesis 7/22, culture of fluid from right and left pleural effusions positive for malignant cells. Biopsy results from L spine still pending. Underwent biopsy of right temporal artery 7/25. Rad oncology following.   [name]     Anthropometrics    Height: 165.1 cm (5' 5)   Weight:  (-1.3 kg)   BMI (Calculated): 29.3 kg/m2   IBW: 61.8 kg +/-10%   UBW:   Weight Hx: if 7/26 weight true actual weight, weight loss over the past few months though none significant. Accuracy of weight changes may be altered by fluid status pre/post-HD at time weights were obtained. Updated actual weight needed for accurate assessment.   WEIGHTS Weight (metric) Weight (standard) Weight Method   10/05/2023 79.8 kg 175 lbs  --   10/01/2023 79.833 kg  79.9 kg 176 lbs  176 lbs 2 oz --   09/29/2023 80.1 kg  80.1 kg 176 lbs 9 oz  176 lbs 9 oz Stated   09/28/2023 80.1 kg 176.2 lbs [facility] post-HD   09/23/2023 82.6 kg  182 lbs  Office visit    08/21/2023 84.4 kg  186 lbs  Office visit    08/16/2023 83.9 kg  185 lbs  Office visit    04/03/2023 87.5 kg 193 lbs  Office visit    10/03/2022 90.7 kg  200 lbs  Office visit    08/09/2021 86.183 kg 190 lbs Stated      Nutrition Focused Physical Exam assessed 7/23   Inpatient Orders    Diet:Potassium restricted (2-3 gm)  Nepro with carb steady TID  Food Allergies: coconut; clarified 7/23     Access: PIV and HD cath  Reviewed labs, medications and IO's.   Labs: CO2 19, UN 29, creatinine 7.05, GFR 8, calcium  8.5, alk phos 218.   Meds: Nephrovite, calcium -vitamin D , Culturelle, fludrocortisone , Mg oxide, omeprazole , bowel regimen (refusing), vanco.     Last BM 7/30     Edema: none     Abdomen: +BS   Skin: ecchymosis/bruising     Nutrition Impact Symptoms:   []  Nausea / Vomiting []  Abdominal Pain or Cramping    [x]  Reduced Appetite []  Early Satiety   []  Diarrhea []  Constipation   []  Bloating [x]  Chewing Difficulty   []  Poor dentition  []  Smell aversions   []  Dentures  []  Taste changes   []  Post prandial pain []  Painful swallowing   []  Unable to assess      Estimated Needs (based on 80 kg)   2000-2400 kcal/day (25-30 kcal/kg)    105-145 g protein/day (1.3-1.8 g/kg)    [phone] mL +UOP or per Nephrology         Assessment & Summary: since last RD assessment PO intake 50-100%. Spoke with patient and wife. Patient reported appetite sucks. Added he continues with soft foods d/t chewing difficulty but finding it hard to consume adequate  protein. Nepro increased from daily to TID per patient request. No other complaints.     Plan to continue current diet and supplement order.     Malnutrition Status: Does not meet 2 or more (of 6) criteria for either moderate or severe protein-calorie malnutrition    Nutrition Intervention   Continue Potassium restricted (2-3 gm) diet   Nepro (420 calories, 19 g protein each) increased to TID   Continue Nephrovite daily   RD provided nutrition education: soft, high-protein food items available on current menu; where to buy Nepro supplements outside hospital  Recommend  outpatient RD follow up with [facility] medicine: appointment #[phone]  Recommend obtain updated actual weight.     Nutrition Monitoring Recommendations   Monitoring: weekly weight, % meals  consumed, IO's, labs as ordered by team appropriate      Following per high nutrition risk protocol.  Tinnie Curet, MS RD  Assessment type: Follow-Up, PN Check, Diet Education, Other

## 2023-10-09 NOTE — Progress Notes (Signed)
 Hospital Medicine Service Attending Progress Note    Significant Events/Subjective:   - Intermittently hypotensive overnight  - No acute overnight events    - States he stretched his L hand out last night to reach for something and caused L shoulder pain. Able to use hand. Discussed lidocaine  patch and Voltaren  gel.   - Discussed biopsy results and concern for metastatic carcinoma. Discussed renal cell origin of his cancer likely. Discussed this with wife as well. Also consulted Oncology and discussed they will help guide next steps.   - States he was expecting this news  - Discussed we also called Radiology and likely will get MR head today/tomorrow.     Objective:     Physical Exam  Temp:  [36.1 C (97 F)-37.1 C (98.8 F)] 36.8 C (98.2 F)  Heart Rate:  [83-86] 83  Resp:  [16] 16  BP: (78-121)/(49-66) 98/52  79.8 kg (175 lb 14.8 oz)    Constitutional: Sitting up in chair with computer on table working, talkative, no acute distress, alert  CV: Regular rate, regular rhythm, no murmur/rub/gallops, no pedal edema  Resp: Normal work of breathing on room air, continue to see improved aeration bilaterally without adventitious lung sounds  GI: Abdomen soft, nontender, somewhat distended, no organomegaly  GU: No Foley  MSK: Able to move all extremities spontaneously, normal bulk. Some pain with palpation of L shoulder.   Skin: Warm, dry, intact  Neuro: AO x 4  Psych: Good insight, fair mood, reasonable    Recent Lab Studies:  Personally reviewed and notable for:    - BMP: BUN 56, Cr 10.60, K 3.8  - CBC: WBC 12.9, H/H 9.3/31, PLT 494    Recent Labs   Lab 10/09/23  0536 10/08/23  0743 10/07/23  0217   WBC 14.3* 12.9* 11.7*   Hemoglobin 9.8* 9.3* 9.6*   Hematocrit 33* 31* 34*   Platelets 512* 494* 479*      Recent Labs   Lab 10/09/23  0536 10/08/23  0743 10/07/23  0217   Sodium 136 134 134   Potassium 4.3 3.8 4.6   Chloride 100 96 96   CO2 19* 19* 13*   UN 29* 56* 47*   Creatinine 7.05* 10.60* 9.23*   Glucose 101* 92 85    Calcium  8.5* 8.5* 8.4*   Albumin 3.0* 2.9* 3.0*   Phosphorus 2.9 3.4 3.7     No results for input(s): APTT, INR, PTT, PTI in the last 168 hours.      Recent Imaging Studies:  No results found.      Assessment:     Aven Christen is a  63 y.o. male with ESRD (on HD TTS), hypotension, and lumbosacral spondylosis who presented to the ED for chest pain and left shoulder pain. Initial work up was notable for mild leukocytosis, chronic anemia, thrombocytosis, elevated NT-pro BNP, elevated but adynamic troponin, elevated AST and Alk Phos, and ESRD.  Initial chest x-ray demonstrating right sided opacity, concern for atelectasis and minimal right pleural effusion.  He was admitted for constitutional symptoms and workup initially concerning for occult infectious process, rheumatologic disorder or malignancy.  Further workup since admission is highly suggestible for metastatic disease of unclear primary with multiple cervical/thoracic/lumbar spine lesions with extension into paraspinal soft tissues, bilateral psoas lesions,  multiple nodal/intramuscular lesions on CT A/P all likely metastatic lesions s/p lumbar osseous lesion biopsy, pending pathology. Further, has b/l exudative pleural effusions s/p tap (pending cytology).  Also found to have vertical  diplopia and blurry vision s/p temporal artery biopsy for GCA with no signs for temporal arteritis with rheumatology/ophthalmology signing off. Currently, patient is clinically stable, pending further diagnostic results with pathology.      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    - Stop vanc/cefepime /flagyl  given biopsy negative for infection.   - Heme/onc following, appreciate ongoing recommendations.  Re-engaged them today personally  - Rad Onc consulted and pending patient biopsy results prior to pain intervention per patient preference  - Follow up MRI brain (ordered 7/22) and currently pending; will call Radiolgy to ensure order is not lost. Likely will be done today/tomorrow.   -Pain control: 1000 mg Tylenol  scheduled 3 times daily.  Can transition to as needed, as minimal pain and has not needed any opiates since 7/22    Large right exudative pleural effusion s/p thoracentesis (7/22); Positive for Malignant Cells  Moderate left pleural effusion  - Positive for malignancy as well    Vertical diplopia  Low concern for GCA, s/p temporal artery biopsy (7/25)  -Ophthalmology consulted and following  -Rheumatology was consulted at admission for concern of GCA considering blurry vision, s/p temporal artery biopsy with no signs of temporal arteritis.  Rheumatology signed off  -Wonder if there could be infiltration of cranial nerves and if additional brain imaging with MRI would be warranted considering acute onset and setting of potential malignancy.  No obvious tumor seen on CT head  -ESR/CRP trend every 3 days -ESR significantly unchanged since admission, CRP improving    Moderate refeeding syndrome risk  -BMP, mag, Phos every day x 3 days then reevaluate (7/24 - 7/26)  - No repletion's required thus far    Isolated alkaline phosphatase elevation  Could be from bone  -GGT within normal limits, this is from bone    Mild anion gap metabolic acidosis  Hyponatremia  Hypochloremia  Uremia  Hyperkaleima s/p temporizer, resolved  ESRD with HD (T/TH/Sat)  -Reassess after hemodialysis  - Nephrology following closely   - BMP every AM     Normocytic anemia  Likely secondary to ESRD  -Nephrology and holding  Epogen    Subacute diarrhea  -Stool studies negative     Additional concerns-  -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 If for primary prevention, will need to discontinue permanently. Will assess further with patient - per prior doc, does not appear to have hx of CVA/MI.  Currently being held in preparation for IR biopsy and GCA biopsy  Allergic rhinitis - Continue flonase     DVT PPx: Heparin  subcu    Emergency Contact: [name] (spouse/HCP) [phone]   Code Status: Full code    Discharge Planning:   MRDD: Unclear  Discharge Criteria/Barriers to Discharge: Further workup of  potential malignancy including biopsy/outpatient connection, rule out cervical osteo  PT and/or OT Recommendations/Discharge to: PT recommending to home with PT.  OT recommending ongoing support for ADLs.  OT notes he had much improvement while hospitalized.   Appointments needed with: PCP, heme-onc, neurosurg, ophthalmology    Case discussed with RN, SW, CC and APP. Discussed with Oncology.     Shabnam Ladd S. Tobie, MD  [facility] Medicine Attending  [facility] [place]  [place] [id number]

## 2023-10-09 NOTE — Progress Notes (Signed)
 Patient is scheduled for hemodialysis 1st shift tomorrow morning. Transporters will arrive for patient between 0700-0800. Please obtain an accurate weight prior to pick up, ensure the patient is ready for transport, and administer any pre-dialysis medications. Also, please make sure patient is on HoverMatt prior to coming to Dialysis. If patient is to receive MIDODRINE please ensure dose has been administered prior to the patient leaving for HD. Please consult pharmacy if you are unsure of medications that should be held prior to HD.     Please weigh patient daily for accurate fluid monitoring.    Thank you.    Otelia Limes, RN

## 2023-10-09 NOTE — Continuity of Care (Signed)
 HOME CARE AGENCY CHOICE    Medicare regulations require that hospitals advise patients that they may choose to receive services from any home health agency.  Patient/family choice of agency is noted with an [x]  by that agency's name.      Agencies: []   [facility] ([address], [place], [place], [place], [place], [place]  and [place] counties)       []   [facility]       [x]   [facility]      []   [facility]        []   [facility])      []   [facility] Only)        []   Other ____________________      Home Care Agency Representative alerted:    Name: Carlis    Date: 10/09/23    Time: 11:56 am      *[facility] along with [facility], [facility] and [facility], [facility], [facility], and [facility] are all part of the [place] [facility] [place]'s health care system.

## 2023-10-09 NOTE — Progress Notes (Signed)
 Report Given To   Dorvil, Jovenel, RN         Descriptive Sentence / Reason for Admission   Team: Medicine     Presented with:intermittent (L) shoulder pain x 2 weeks. Inability to ambulate x 1 week r/t (R) leg pain. + muscle aches + fatigue + weight loss + night sweats + poor PO intake  Relevant PMHx:ESRD HD (T,Th,S) Osteoarthritis, Hypotension    Dx: metastatic disease       Active Issues / Relevant Events   Shift Event (last 24 hours):  7/29N:No change to patient condition. Labs drew and specimen sent to lab. Pending MRI.   7/30 D: OOB 1 assist/FWW to bathroom and chair. Sat in chair much of shift. C/O 2 episodes diarrhea but flushed before staff visualized. Received bx results from providers today. Pending MRI and NM bone scan.  Precautions: Fall         AOX:X4  Mobility: 1 assist/ FWW  Pills: Whole  Meals: Low K+   Continent: yes  IV: SL right fa   Drains/tubes/O2/Bipap/CPAP: HD catheter left IJ     Patient Behaviors:Calm      To Do List  VS:Q4  Tele: no   Daily CHG bath d/t HD cath    Labs/Tests: ESRD, dialysis T/Th/Sat      Anticipatory Guidance / Discharge Planning  Dentures: Uppers []  Lowers [] , Hearing Aids R[]  L[] , Glasses []   Belongings in Safe/Cash[employer] Office []   Support Person/Contact:   Tasks Pending: MRI brain   Education Needed:                                                                          D/C to: Home

## 2023-10-09 NOTE — Progress Notes (Signed)
 Physical Therapy Treatment Note    Therapy Recommendations:  Discharge Recommendations:  Discharge to Planned Living Arrangement:    Patient's mobility is NOT a barrier to discharge, but will continue to follow for remainder of stay.  Recommendations:   PT Discharge Equipment Recommended: (P) Rolling walker,    Additional justification:   n/a  PT Positioning Recommendations: (P) OOBTC daily  PT Mobility Recommendations: (P) Modified Independent with RW  PT Referral Recommendations: (P) SW, Home care       10/09/23 0840   PT Tracking   PT TRACKING PT Assigned   Visit Number   Visit Number [facility]) / Treatment Day [facility]) 3   Visit Details [facility])   Visit Type [facility]) Follow Up-General   Precautions/Observations   Precautions used Yes   Fall Precautions General falls precautions   Activity Orders Present Yes   LDA Observation Monitors;Dialysis catheter   Vital Signs Response with Therapy NAD   Was patient wearing a mask? No   PPE worn by Clinical research associate [facility]   Current Pain Assessment   Pain Assessment / Reassessment Assessment   Pain Scale 0-10 (Numeric Scale for Pain Intensity)   0-10  Pain Scale 7   Pain Location/Orientation Shoulder Left   Vision    Current Vision Adequate for PT session   Additional Comments Wearing glasses throughout session   Communication   Communication Communication Style   Communication Style Verbal   Cognition   Cognition Tested   Arousal/Alertness Appropriate responses to stimuli   Ability to Follow Instructions Follows simple commands without difficulty   Type of Instructions Given Verbal   Bed Mobility   Bed mobility Not tested   Additional comments Pt seated in recliner at start and end of session   Transfers   Transfers Tested   Stand Pivot Transfers Independent   Sit to Stand Independent   Stand to sit Independent   Transfer Assistive Device rolling walker;none   Additional comments Pt completes from recliner and rolling shower chair with ease.   Mobility   Mobility: Gait/Stairs Tested   Gait  Pattern Decreased cadence   Ambulation Assist Modified independent (device)   Ambulation Distance (Feet) 20   Ambulation Assistive Device rolling walker   Stairs Assistance Supervision   Stair Management Technique One rail;Step to pattern   Number of Stairs 2   Additional comments Pt ambulates to door of room, before stating that he doesn't feel like walking today, relates to issues with BP, but declines lightheaded/dizzy. Was agreeable to attempt stairs if writer gave him a ride there, was able to negotiate stairs to enter home safely.   Training and Education   Patient Discussed role of PT in hospital setting and current PT recommendations. pt in agreement and without questions at this time.   Balance   Balance Tested   Sitting - Static Independent   Sitting - Dynamic Independent   Standing - Static Independent;Supported   Standing - Dynamic Independent;Supported   Additional comments RW for support in standing   Functional Outcome Measures   Functional Outcome Measures Yes   PT AM-PAC Mobility   Turning over in bed? 4   Moving from lying on back to sitting on the side of the bed? 4   Moving to and from a bed to a chair? 4   Sitting down on and standing up from a chair with arms? 4   Need to walk in hospital room? 4   Climbing 3 - 5 steps with a railing?  3   Total Raw Score 23   AM-PAC T-Scale Score 50.88   Assessment   Brief Assessment Appropriate for skilled therapy;Patient demonstrates adequate mobility to discharge to planned living arrangement   Problem List Impaired endurance;Impaired stair navigation;Pain contributing to impairment   Patient / Family Goal did not state   Overall Assessment Pt demonstrates functional mobility at level needed for safe home d/c. Will continue to follow in order to maximize safety and independence.   Plan/Recommendation   PT Treatment Interventions Assess functional mobility;Stair training;Pt/Family education;Home exercise program instruction;D/C planning;Will work to minimize  pain while promoting mobility whenever possible   PT Frequency 2-4 x/wk   PT Positioning Recommendations OOBTC daily   PT Mobility Recommendations Modified Independent with RW   PT Referral Recommendations SW;Home care   PT Discharge Recommendations Home;Intermittent supervision/assist   PT Discharge Equipment Recommended Rolling walker   Transportation Recommendations any   PT Assessment/Recommendations Reviewed With: Patient;Nursing   Next PT Visit higher level balance; review stairs and amb   PT needs to see patient prior to DC  No   Time Calculation   Total Time Therapeutic Activities (minutes) 0   Total Time Gait Training (minutes) 14   Total Time Therapeutic Exercises (minutes) 0   Total Time Neuromuscular Re-education (minutes) 0   Total Time Group Therapy (minutes) 0   PT Timed Codes 14   PT Untimed Codes 0   PT Unbilled Time 0   PT Total Treatment 14   Plan and Onset date   Plan of Care Date 10/09/23   Onset Date 09/28/23   Treatment Start Date 09/30/23       Brad Sable, PT, DPT      Please contact physical therapist on the treatment team via secure chat or via the secure chat group for your unit Physical Therapist with any questions.    On weekends/holidays, please utilize the secure chat group, [facility] Physical Therapy 1st call, to contact PT.

## 2023-10-09 NOTE — Progress Notes (Signed)
 Oncology Consult Note    Requesting Provider: Dr. Francy Blanch MD  Reason for Consult: New metastatic renal cell carcinoma dx       Shane [facility] is a 63 y.o. male with a past medical history significant for ESRD, hypotension, and lumbosacral spondylosis now with metastatic carcinoma of renal origin. Oncology was previously consulted last week for concern for pulmonary nodule and we recommended biopsy of one of the lesions for pathologic diagnosis. Path report came back c/w carcinoma of renal origin this morning and we are now following up with Dr. Bronson, or GU oncologist.    Pleural fluid analysis on 7/19 showed malignant tumor cells derived from carcinoma. He underwent IR bone biopsy of L5 vertebral body on 7/23, which showed metastatic carcinoma of renal origin.      Shane [facility] endorses some pain in the left shoulder that radiates down his arm. This pain comes and goes.  Feels like sometimes it is weak, no numbness, and feels like it was feeling better until he tweaked it yesterday reaching for something. Today it is feeling okay though. He also has some neck pain, but is able to ambulate to the bathroom. He also reports some blurry vision which is improved but still present and sometimes makes it difficult to view his laptop screen.      Vitals  Vitals:    10/09/23 1558   BP: 141/88   Pulse: 89   Resp: 22   Temp: 37 C (98.6 F)   Weight:    Height:        Physical Exam  GENERAL: Comfortable, NAD  LYMPH: No palpable submandibular, submental, cervical, auricular, supraclavicular, axillary, inguinal lymphadenopathy.   NEURO: AxOx3, no focal deficits  HEENT: NCAT,. No scleral icterus. No conjunctival pallor or injection. Mucus membranes moist.   LUNGS: CTAB, normal WOB, no wheezes, or rhonchi.   HEART: RRR, HS I+II+ no added sounds, no pedal edema   ABDOMEN: Soft, NT, ND, BS normal, no rebound or guarding.  EXTREMITIES: Pulses 2+ bilat, no edema or cyanosis, dorsalis pedis and posterior tibial pulses 2+ bilaterally. No pain  or weakness in the left upper extremity.  SKIN: No rashes or erythema    Lab Results:  Lab Results   Component Value Date/Time    NA 136 10/09/2023 05:36 AM    K 4.3 10/09/2023 05:36 AM    CL 100 10/09/2023 05:36 AM    CO2 19 (L) 10/09/2023 05:36 AM    UN 29 (H) 10/09/2023 05:36 AM    CREAT 7.05 (H) 10/09/2023 05:36 AM    CREAT 13.98 (H) 01/17/2019 10:38 AM    GFRC 3 (!) 11/05/2017 01:12 PM    GFRB 3 (!) 11/05/2017 01:12 PM    GLU 101 (H) 10/09/2023 05:36 AM    CA 8.5 (L) 10/09/2023 05:36 AM    TP 5.9 (L) 10/09/2023 05:36 AM    ALB 3.0 (L) 10/09/2023 05:36 AM    ALT 7 10/09/2023 05:36 AM    AST 44 10/09/2023 05:36 AM    ALK 218 (H) 10/09/2023 05:36 AM    TB 0.3 10/09/2023 05:36 AM    DB <0.2 10/09/2023 05:36 AM     Lab Results   Component Value Date/Time    WBC 14.3 (H) 10/09/2023 05:36 AM    RBC 4.0 10/09/2023 05:36 AM    HGB 9.8 (L) 10/09/2023 05:36 AM    HCT 33 (L) 10/09/2023 05:36 AM    MCV 82 10/09/2023 05:36 AM    RDW  21.2 (H) 10/09/2023 05:36 AM    PLT 512 (H) 10/09/2023 05:36 AM    SEGR 77.5 10/09/2023 05:36 AM    LYMPR 26.3 07/02/2020 07:48 AM    BAND 3 10/09/2023 05:36 AM    MONOR 8.4 07/02/2020 07:48 AM    EOSR 5.1 07/02/2020 07:48 AM    BASOR 0.5 07/02/2020 07:48 AM    ASEGR 11.6 (H) 10/09/2023 05:36 AM    ALYMR 1.4 10/09/2023 05:36 AM    AMONR 1.1 (H) 10/09/2023 05:36 AM    AEOSR 0.1 10/09/2023 05:36 AM    ABASR 0.1 10/09/2023 05:36 AM    INR 1.4 (H) 09/30/2023 07:43 PM    PTT 36.3 03/20/2018 08:08 PM       Pathology  FINAL DIAGNOSIS:   Pleural fluid, thoracentesis:   - Malignant tumor cells derived from carcinoma. See comment.   Comment: Immunohistochemical stains show that the tumor cells mark with claudin4 and Napsin A. They do not mark with TTF-1, CDX-2, P40, and NKX3.1, No intracellular mucin is identified with Kreyberg stain. These staining results support epithelial differentiation    FINAL DIAGNOSIS:   Bone, L5, biopsy:   - Metastatic carcinoma, see comment.   I personally reviewed the prepared  specimen(s), reviewed and/or edited the resident's or fellow's findings, and rendered a final diagnosis based on my review.   COMMENT: The biopsy demonstrates predominantly fragments of bone with crush artifact and fibrotic areas containing nests of tumor cells. Immunohistochemical stains show the tumor cells are positive for pankeratin and PAX8, focally positive for Napsin A, while negative for TTF-1, NKX3.1, CDX2 and GATA3. The morphologic and immunohistochemical profile are most consistent with metastatic carcinoma of renal origin. Clinical correlation is recommended.      CT 7/20 left shoulder with no fracture or dislocation      Path      Assessment and Plan:   Shane [facility] is a 63 y.o. male with one month of chest pain, left shoulder pain, weakness, weight loss, and night sweats. Imaging was concerning for cervical, thoracic, and lumbar spine lesions, mediastinal lymphadenopathy, and bilateral pleural effusions. Pleural fluid cytology was consistent with malignancy. Pathology report from an L5 bone biopsy came back this morning which noted metastatic carcinoma of renal origin. 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 Papillary RCC is also on the differential, and less likely chromophobe RCC. In the meantime, given his vision symptoms which come and go, it is prudent to obtain brain MRI, which is already ordered and should get done today. Additionally, patient should get a full bone scan to assess for bone involvement. We appreciate radiation oncology's involvement in this case and think he may benefit from some palliative radiation for better pain control. We discussed the diagnosis of carcinoma of renal origin, which is metastatic with [facility] and his wife Shane Pearson over the phone. Dr. Bronson plans to follow-up with patient in the outpatient setting after discharge. This plan  was articulated to the primary team -- we appreciate your care of Mr. Rother.     Recommendations  - Ensure patient is up to date on age appropriate cancer screening  - Agree with MRI brain to assess for brain mets  - Please order bone scan for patient  - Please re-engage Rad Onc (Dr. Virgia) who saw the patient last 7/28 to discuss RT  -  We will schedule follow-up with Dr. Bronson within 1-2 weeks of discharge  - We will follow-up with pathology to confirm diagnosis    Thank you for allowing us  to participate in the care of this patient. We will continue to follow. Please page oncology fellow if questions arise. Patient seen and discussed with attending Dr. Bronson. Attestation to follow.     Juliene DOROTHA Pluck, MD  Hematology/Oncology Fellow PGY-4  [facility], [place]

## 2023-10-09 NOTE — Progress Notes (Signed)
 Shane Pearson was seen and examined with medical housestaff, Dr. Juliene Pluck, and I agree with their outlined findings and plans with the following additions and changes.      Diagnosis: Metastatic carcinoma of renal origin  Stage at Diagnosis: IV    Oncologic History  09/28/23: Presented to emergency room with generalized malaise, pain, weight loss and failure to thrive.  09/28/23 to present: Admitted to hospital for workup.  Imaging studies consistent of CT shoulder, cervical spine, head, chest, abdomen and pelvis (without contrast); MRI cervical, thoracic and lumbar spine, and X-ray of bilateral femurs, which showed no acute radiographic finding in left shoulder and head.  There is moderate right and small left layering pleural effusion, multiple enlarged mediastinal lymph nodes, right chest wall subcutaneous soft tissue density nodules, a 2.6 x 1.7 cm right adrenal lesion with indeterminate attenuation, polycystic kidney disease, retroperitoneal gastrohepatic, and periportal lymphadenopathy, intramuscular lesions throughout the pelvis and paraspinal musculature, and osseous metastases in cervical, thoracic and lumbar spine.   IR biopsy of L5 lesion showed metastatic carcinoma of renal origin. Cytology of pleural fluid (dated 10/01/23) showed malignant tumor cells derived from carcinoma, supporting epithelial differentiation.      History of Present Illness  Shane Pearson is a 63 y.o. man with polycystic kidney disease on hemodialysis, congestive heart failure, GERD, and hypercholesterolemia, who presents for consultation for newly diagnosed stage IV metastatic carcinoma of renal origin.  Please refer to Oncologic History above for details of evaluation and management of malignancy.  His spouse, Shane Pearson (on the speaker phone), accompanied us  throughout the encounter.    This afternoon, Shane Pearson reports few month history of generalized malaise, bone pain, loss of appetite and unintentional weight loss.  Few weeks ago,  he developed intermittent blurry vision.  The symptoms that bother him the most currently are left shoulder and arm pain with mild weakness, and bilateral hip and pelvic pain.    Vitals  Vitals:    10/09/23 0504 10/09/23 0818 10/09/23 1151 10/09/23 1558   BP: 98/52 121/78 100/56 141/88   BP Location: Right leg Right leg Right leg Right leg   Pulse: 83  87 89   Resp: 16 (!) 28 24 22    Temp: 36.8 C (98.2 F) 37.1 C (98.8 F) 36.9 C (98.4 F) 37 C (98.6 F)   TempSrc: Temporal Temporal Temporal Temporal   SpO2: 95% 95% 95% 98%   Weight:       Height:         Physical Examination  ECOG Performance Status: 2    General: Patient appears cachetic, lying in bed, but in no acute distress.Shane Pearson   Respiratory: Decreased breath sounds bilaterally  Cardiovascular: Hearts sounds are regular  Gastrointestinal: Abdomen is soft, no tenderness, no distension    Musculoskeletal: No lower extremity swelling.  Left shoulder joint has tenderness to palpation.  Neurological: No gross focal neurological deficit.  Generalized weakness     Laboratory      Lab results: 10/09/23  0536   WBC 14.3*   Hemoglobin 9.8*   Hematocrit 33*   RBC 4.0   Platelets 512*           Lab results: 10/09/23  0536   Sodium 136   Potassium 4.3   Chloride 100   CO2 19*   UN 29*   Creatinine 7.05*   Glucose 101*   Calcium  8.5*   Total Protein 5.9*   Albumin 3.0*   ALT 7   AST 44  Alk Phos 218*   Bilirubin,Total 0.3     Impression  Stage IV metastatic carcinoma of renal origin.    Recommendation  Shane Pearson is a 63 y.o. man with polycystic kidney disease on hemodialysis, congestive heart failure, GERD, and hypercholesterolemia, who presents for consultation for newly diagnosed stage IV metastatic carcinoma of renal origin.  Today, I reviewed with Shane Pearson and his spouse, Shane Pearson, the pathology result of the L5 lesion and pleural fluid, his recent imaging studies, and all his relevant medical records.  Based on these clinical data, I am concerned that Shane Pearson has  developed stage IV metastatic carcinoma of renal origin.    Today, I reviewed with Shane Pearson that patients with autosomal dominant polycystic kidney disease may have higher risk of developing renal cell carcinoma.  In the case of Shane Pearson, Shane Pearson is uncertain if he has autosomal dominant polycystic kidney disease.    We will send Shane [facility] to [facility] hereditary genetics [facility] for evaluation after discharge from hospital.    We will also send his L5 biopsy sample to somatic genetic testing after he is discharged from hospital.    To complete staging of cancer, I recommend the following:  MRI brain to rule out any intracranial metastases given his double vision  Bone scan to evaluate for osseous metastases.    For management of stage IV metastatic carcinoma of renal origin, I will review his clinical case at the next GU tumor board, particularly to discuss with pathologist about histologies of the carcinoma.  If Shane Pearson is found to have clear cell or non-clear cell renal cell carcinoma, systemic treatment would include VEGF inhibitors and/or immune checkpoint inhibitors.  Today, I explained to Shane Pearson that the treatment is palliative in nature.    In regard to his pelvic pain, this is likely due to paraspinal metastases.      In regard to his left shoulder/arm pain with mild intermittent weakness, I am concerned about the C7 lesion causing compression of nerve, I recommend  Re-consultation with radiation oncology to determine if there is any role for palliative radiation to C7 area.       I will tentatively schedule Shane Pearson to follow up with me next week at Shane [facility] site.      Filiberto Fall, MD    Professor of Medicine and Public Health  Associate Chief of Solid Tumor Oncology Program  [facility]  Division of Hematology/Oncology  Department of Medicine

## 2023-10-09 NOTE — Progress Notes (Signed)
 Report Given To  Lamarr Fell, RN      Descriptive Sentence / Reason for Admission   Team:    Presented with:intermittent (L) shoulder pain x 2 weeks. Inability to ambulate x 1 week r/t (R) leg pain. + muscle aches + fatigue + weight loss + night sweats + poor PO intake  Relevant PMHx:ESRD HD (T,Th,S) Osteoarthritis, Hypotension  Ik:[id number]           Active Issues / Relevant Events   Shift Event (last 24 hours):  7/23 D: Labs sent, soft BP throughout shift provider aware. Worked with PT, OOB to chair, potassium trended down currently 5.0. C/o blurred vision to provider, NPO at midnight possible biopsy tomorrow. More PT work up for discharge.   7/23 N BP on soft side, midodrine  and florinef  given. Tele discontinued by provider Brad prior to MRI. Patient trasported to MRI at [facility] and after MRI will transport directly to hemodialysis. Morning labs will be collected by HD nurse Mont Bench RN. Provider Brad Broody PA is aware and will reorder tele once the patient in hemodialysis.  7/24 D12: MRI, then HD only dialyzed for 3 hours, IR lumbar biopsy completed today, however IR called and not all needed specimens collected. Pt to go to IR for another spine biopsy 7/25.   7/25 D: Biopsy complete. VS remain stable throughout day. Full bath done by pt's wife. NPO resumes @ midnight.   7/26D: Patient cooperative with care and medications. Patient dialyzed today. Ambulated to the bathroom with RW and standby assist, reports x2 BM.   7/27D: No acute event, patient pleasant and cooperative with care. Ambulating in hallway with RW. Tolerating ABX well. MRI of head ordered.   7/28D: Patient experiencing back pain, given tylenol  and hot packs with positive effect. Ambulating to bathroom with standby assist.   7/28N: patient has a quiet night. No complain. Lab labels printed placed into chart. Report to Dialysis nurse this AM.   7/29D: went to dialysis today. New 22G IV placed. Bathroom x2.   7/29N:No change to patient  condition. Labs drew and specimen sent to lab. Pending MRI.   Precautions:        AOX:X4  Mobility: Cane  Pills: Whole  Meals: NPO   Continent: (yes/no)  IV:  g22 right hand   (drsg due__)   (Line cap change due__)  IVF/gtts:     Drains/tubes/O2/Bipap/CPAP:     Patient Behaviors:Calm           To Do List  VS:Q4  Tele: yes  Sticker Change Daily []   Daily CHG bath d/t HD cath  Labs/Tests: ESRD, dialysis T/Th/Sat (received dialysis 07/19)       Anticipatory Guidance / Discharge Planning  Dentures: Uppers []  Lowers [] , Hearing Aids R[]  L[] , Glasses []   Belongings in Safe/Cashiers Office []   Support Person/Contact:   Tasks Pending:  Education Needed:                                                                          D/C to:

## 2023-10-09 NOTE — Progress Notes (Signed)
 Home Care Initial Assessment - [facility]     [facility]:  Referral for home care service has been received.We have _x___Accepted   [facility] will continue to follow patient throughout hospital stay and plan for home care needs.      Jonn Shingles Maicol [facility]   Home Care Coordinator   [phone]

## 2023-10-10 ENCOUNTER — Inpatient Hospital Stay

## 2023-10-10 ENCOUNTER — Encounter

## 2023-10-10 DIAGNOSIS — N186 End stage renal disease: Secondary | ICD-10-CM

## 2023-10-10 DIAGNOSIS — I129 Hypertensive chronic kidney disease with stage 1 through stage 4 chronic kidney disease, or unspecified chronic kidney disease: Secondary | ICD-10-CM

## 2023-10-10 DIAGNOSIS — E889 Metabolic disorder, unspecified: Secondary | ICD-10-CM

## 2023-10-10 LAB — BASIC METABOLIC PANEL
Anion Gap: 13 (ref 7–16)
CO2: 24 mmol/L (ref 20–28)
Calcium: 9 mg/dL (ref 8.6–10.2)
Chloride: 99 mmol/L (ref 96–108)
Creatinine: 9.35 mg/dL — ABNORMAL HIGH (ref 0.67–1.17)
Glucose: 96 mg/dL (ref 60–99)
Lab: 41 mg/dL — ABNORMAL HIGH (ref 6–20)
Potassium: 4 mmol/L (ref 3.3–5.1)
Sodium: 136 mmol/L (ref 133–145)
eGFR BY CREAT: 6 — AB

## 2023-10-10 LAB — HEPATIC FUNCTION PANEL
ALT: 5 U/L (ref 0–50)
AST: 31 U/L (ref 0–50)
Albumin: 3.1 g/dL — ABNORMAL LOW (ref 3.5–5.2)
Alk Phos: 206 U/L — ABNORMAL HIGH (ref 40–130)
Bilirubin,Direct: <0.2 mg/dL (ref 0.0–0.3)
Bilirubin,Total: 0.3 mg/dL (ref 0.0–1.2)
Total Protein: 6.6 g/dL (ref 6.3–7.7)

## 2023-10-10 LAB — MAGNESIUM: Magnesium: 1.9 mg/dL (ref 1.6–2.5)

## 2023-10-10 LAB — CBC AND DIFFERENTIAL
Baso # K/uL: 0.1 THOU/uL (ref 0.0–0.2)
Eos # K/uL: 0.4 THOU/uL (ref 0.0–0.5)
Hematocrit: 32 % — ABNORMAL LOW (ref 37–52)
Hemoglobin: 9.4 g/dL — ABNORMAL LOW (ref 12.0–17.0)
Lymph # K/uL: 1.4 THOU/uL (ref 1.0–5.0)
MCV: 83 fL (ref 75–100)
Mono # K/uL: 1.1 THOU/uL — ABNORMAL HIGH (ref 0.1–1.0)
Neut # K/uL: 10.6 THOU/uL — ABNORMAL HIGH (ref 1.5–6.5)
Nucl RBC # K/uL: 0.1 THOU/uL (ref 0.0–0.1)
Nucl RBC %: 0.6 /100{WBCs} — ABNORMAL HIGH (ref 0.0–0.2)
Platelets: 538 THOU/uL — ABNORMAL HIGH (ref 150–450)
RBC: 3.9 MIL/uL — ABNORMAL LOW (ref 4.0–6.0)
RDW: 21.7 % — ABNORMAL HIGH (ref 0.0–15.0)
Seg Neut %: 77.4 %
WBC: 13.8 THOU/uL — ABNORMAL HIGH (ref 3.5–11.0)

## 2023-10-10 LAB — DIFF MANUAL
Diff Based On: 115 {cells}
Myelocyte %: 1 % — ABNORMAL HIGH

## 2023-10-10 LAB — PHOSPHORUS: Phosphorus: 3 mg/dL (ref 2.7–4.5)

## 2023-10-10 MED ORDER — TECHNETIUM TC-99M MEDRONATE (MDP) IV *I*
18.0000 | PACK | Freq: Once | INTRAVENOUS | Status: AC
Start: 2023-10-10 — End: 2023-10-10
  Administered 2023-10-10: 21.18 via INTRAVENOUS

## 2023-10-10 MED ORDER — GADOPICLENOL 0.5 MMOL/ML (VUEWAY) IV SOLN *I*
0.1000 mL/kg | Freq: Once | INTRAVENOUS | Status: AC
Start: 2023-10-10 — End: 2023-10-10
  Administered 2023-10-10: 7.98 mL via INTRAVENOUS

## 2023-10-10 NOTE — Progress Notes (Signed)
 Patient to be discharged today. Patient being discharged to home, report called?    Transportation: family to pick up    IV on right forearm removed    AVS completed and reviewed with patient. Pt. given the opportunity to ask questions      Patient left unit with all belongings including glasses, clothing, phone, laptop.  All belongings that were present on admission are also present on discharge     Patient escorted of unit by unit staff via wheelchair.

## 2023-10-10 NOTE — Progress Notes (Signed)
 Report Given To  Report given to 218 RN     Tolerated tx, MAP > 65 per HD orders. Midodrine  given prior to HD. No acute events.       Descriptive Sentence / Reason for Admission   Duration of Treatment (minutes): 240 min  Complications: none          Active Issues / Relevant Events   Review of medications administered  such as routine meds, antibiotics, and heparin : see MAR  Vital Signs: see Flowsheet        To Do List  Medications still needing administration: n/a  Dressing change due : 10/15/2023      Anticipatory Guidance / Discharge Planning  Date/Time of Next HD Treatment: TBD by Nephrology.  Please weigh patient daily in order to accurately monitor fluid gains.     Thank you.

## 2023-10-10 NOTE — Progress Notes (Unsigned)
 Diagnosis: Metastatic carcinoma of renal originStage at Diagnosis: IV Oncologic History7/19/25: Presented to emergency room with generalized malaise, pain, weight loss and failure to thrive.09/28/23 to 10/10/23: Admitted to [facility] for workup.  Imaging studies consistent of CT shoulder, cervical spine, head, chest, abdomen and pelvis (without contrast); MRI cervical, thoracic and lumbar spine, and X-ray of bilateral femurs, which showed no acute radiographic finding in left shoulder and head.  There is moderate right and small left layering pleural effusion, multiple enlarged mediastinal lymph nodes, right chest wall subcutaneous soft tissue density nodules, a 2.6 x 1.7 cm right adrenal lesion with indeterminate attenuation, polycystic kidney disease, retroperitoneal gastrohepatic, and periportal lymphadenopathy, intramuscular lesions throughout the pelvis and paraspinal musculature, and osseous metastases in cervical, thoracic and lumbar spine.   IR biopsy of L5 lesion showed metastatic carcinoma of renal origin. Cytology of pleural fluid (dated 10/01/23) showed malignant tumor cells derived from carcinoma, supporting epithelial differentiation.  Bone scan showed diffuse background osseous uptake related to patient's renal osteodystrophy, uptake within the proximal right humerus, and predominantly cortical uptake within the proximal bilateral femurs.  Brain MRI showed numerous multifocal areas of punctate enhancement scattered throughout the brain parenchyma, consistent with intracranial metastatic disease. There was no significant mass effect and no evidence of associated hemorrhagic changes.  History of Present IllnessGreg Pearson is a 63 y.o. man with polycystic kidney disease on hemodialysis, congestive heart failure, GERD, and hypercholesterolemia, who presents for follow up for newly diagnosed stage IV metastatic carcinoma of renal origin.  Today, Shane Pearson reports ***  VitalsPhysical ExaminationECOG Performance Status: 2 General: Patient appears cachetic, lying in bed, but in no acute distress.Shane Pearson Respiratory: Decreased breath sounds bilaterallyCardiovascular: Hearts sounds are regularGastrointestinal: Abdomen is soft, no tenderness, no distension  Musculoskeletal: No lower extremity swelling.  Left shoulder joint has tenderness to palpation.Neurological: No gross focal neurological deficit.  Generalized weakness  Laboratory  Lab results: 07/31/250823 WBC 13.8* Hemoglobin 9.4* Hematocrit 32* RBC 3.9* Platelets 538*   Lab results: 07/31/250823 Sodium 136 Potassium 4.0 Chloride 99 CO2 24 UN 41* Creatinine 9.35* Glucose 96 Calcium  9.0 Total Protein 6.6 Albumin 3.1* ALT 5 AST 31 Alk Phos 206* Bilirubin,Total 0.3 ImpressionStage IV metastatic carcinoma of renal origin. RecommendationGreg Pearson is a 63 y.o. man with polycystic kidney disease on hemodialysis, congestive heart failure, GERD, and hypercholesterolemia, who presents for follow up of newly diagnosed stage IV metastatic carcinoma of renal origin.  Today, I again reviewed with Shane Pearson that patients with autosomal dominant polycystic kidney disease may have higher risk of developing renal cell carcinoma.  In the case of Shane Pearson, Shane Pearson is uncertain if he has autosomal dominant polycystic kidney disease.  Today, I recommend referring Shane [facility] to [facility] hereditary genetics clinic for evaluation.We will also send his L5 biopsy sample to somatic genetic testing.Then, I reviewed his MRI brain (dated 10/09/23), which showed numerous multifocal areas of punctate enhancement scattered throughout the brain parenchyma, consistent with intracranial metastatic disease. There was no significant mass effect and no evidence of associated hemorrhagic changes.  His bone scan (dated 10/10/23) showed diffuse background osseous uptake  related to patient's renal osteodystrophy, uptake within the proximal right humerus, and predominantly cortical uptake within the proximal bilateral femurs. For management of stage IV metastatic carcinoma of renal origin, I reviewed his clinical case at GU tumor board on 10/16/23.  According to the pathologist, Shane Pearson has poorly differentiated carcinoma likely of renal origin based on immuno-histochemical staining.  However, we cannot definitely state the primary site  of tumor.  To help differentiate, we can consider another biopsy or send his biopsy sample to [facility] for somatic genetic testing.***If Shane Pearson is found to have clear cell or non-clear cell renal cell carcinoma, systemic treatment would include VEGF inhibitors and/or immune checkpoint inhibitors.  Today, I explained to Shane Pearson that the treatment is palliative in nature.*** In regard to his pelvic pain, this is likely due to paraspinal metastases.   In regard to his left shoulder/arm pain with mild intermittent weakness, I am concerned about the C7 lesion causing compression of nerve.  He was evaluated by radiation oncology while admitted to [facility], who recommended palliative radiation to this area but Shane Pearson declined at the time.  Today, I again reviewed with Shane Pearson this option. ***For management of possible intracranial metastases, I reviewed his clinical case with Dr. Virgia from radiation oncology, who recommended SRS versus surveillance with short interval scans in 6 to 8 weeks.  ***Shane Pearson will ***.  In the interim, I instructed Shane Pearson to call my office with any questions or concerns.  Filiberto Fall, MD Professor of Medicine and Public HealthAssociate Chief of Solid Tumor Oncology ProgramJames P. Congerville Cancer InstituteDivision of Hematology/OncologyDepartment of Medicine

## 2023-10-10 NOTE — Discharge Summary (Signed)
 Name: Shane Pearson MRN: [id number] DOB: Sep 27, 1960     Admit Date: 09/28/2023   Date of Discharge: 10/10/2023     Patient was accepted for discharge to          Discharge Attending Physician: Tobie Francy Howells, MD      Hospitalization Summary    Concise Narrative: Hospital Narrative:  Shane Pearson is a  63 y.o. male with ESRD (on HD TTS), hypotension, and lumbosacral spondylosis who presented to the ED for chest pain and left shoulder pain. Initial work up was notable for mild leukocytosis, chronic anemia, thrombocytosis, elevated NT-pro BNP, elevated but adynamic troponin, elevated AST and Alk Phos, and ESRD.  Initial chest x-ray demonstrating right sided opacity, concern for atelectasis and minimal right pleural effusion.  He was admitted for constitutional symptoms and workup initially concerning for occult infectious process, rheumatologic disorder or malignancy (completed 5 days of broad-spectrum antibiotics). 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 Bronson) and Radiation Oncology. He did have vertical diplopia, for which Opthalmology was consulted and he will see them tomorrow on clinic. MR head was completed today but read is pending, and need to be followed up with as outpatient, and was ordered to evaluate for metastatic brain disease. His metastatic lesions were in multiple cervical/thoracic/lumbar spine lesions with extension into paraspinal soft tissues, bilateral psoas lesions,  multiple nodal/intramuscular lesions. Overall, he was feeling well at time of discharge, without acute complaints and will need very close outpatient follow-up. If MR head and NM bone scan not resulted, this will need to be followed up as an outpatient.     Post-Hospitalization To-Do/Follow for PCP:  Metastatic disease follow-up    Follow up labs to be ordered by PCP:  None    New medications at discharge:  None    Home  medication changes at discharge with rationale:  None    Follow up appointments:  Future Appointments        Oct 10 2023   06:30 AM - MR Head With and Without Contrast (Arrived)  [facility] - RIS, [facility] IPIS MR01           Oct 10 2023   12:15 PM - NM Injection (Arrived)  [facility] - RIS, [facility] NM INJ (NMINJ)           Oct 10 2023   03:30 PM - NM Bone Scan Whole Body  [facility] - RIS, [facility] NM1 (NM1)           Oct 11 2023   03:15 PM - POST OP (NC)  [facility] - Laneta Lynn Hanks, MD           Oct 14 2023   01:30 PM - Office Visit  [facility] - Juliene Ned, MD           Displaying the next 5 appointments. This patient has additional   appointments scheduled.         Important radiology findings and follow up:  None      IR bone biopsy  Result Date: 10/04/2023  CT-guided biopsy of L5 vertebral body lesion. Plan: Specimen(s) sent for evaluation. END OF IMPRESSION I was  present for the critical and key portions of the procedure or visit and was immediately available in the suite to assist. I have personally reviewed the images and the Resident's/Fellow's interpretation and agree with or edited the findings.   MR spine thoracic without and with contrast  Result Date: 10/04/2023  Ill-defined enhancement with T2/STIR hyperintensity in the T6 vertebral body, suspicious for osseous metastasis. A smaller lesion is also seen within the T12 vertebral body. Several enhancing soft tissue lesions in the thoracic paraspinal musculature, likely representing metastases. 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 Partially visualized expansile enhancing lesion in the C7 vertebra which appears to extend to the left anterolateral epidural space and neural foramen. Appearance is similar  to prior MR C-spine. Bilateral pleural effusions with associated pleural thickening. END OF IMPRESSION I have personally reviewed the images and the Resident's/Fellow's interpretation and agree with or edited the findings.     MR lumbar spine without and with contrast  Result Date: 10/04/2023  Multiple enhancing osseous lesions throughout the lumbar spine, sacrum, and visualized portions of the iliac bones, compatible with metastatic disease. Several enhancing soft tissue lesions in the bilateral psoas and paraspinal musculature, likely also representing metastases. There is a background of abnormal low T1 bone marrow signal which may relate to underlying renal osteodystrophy. Multiple abnormal appearing retroperitoneal lymph nodes. Please also see separately dictated CT abdomen pelvis for additional details. END OF IMPRESSION I have personally reviewed the images and the Resident's/Fellow's interpretation and agree with or edited the findings.     EKG 12 lead  Result Date: 10/03/2023  Sinus rhythm Minimal ST depression, lateral leads    * Femur LEFT standard AP and Lateral  Result Date: 10/02/2023  Patchy mixed radiolucency and sclerosis of the proximal femur and patchy sclerosis of the ischium. No pathologic fracture seen. END OF IMPRESSION     * Femur RIGHT standard AP and Lateral  Result Date: 10/02/2023  Patchy decreased bone mineralization of the proximal femur and patchy sclerosis of the ischium. No pathologic fracture perceived. END OF IMPRESSION     CT abdomen and pelvis with IV contrast  Result Date: 10/01/2023  Nodal and extensive intramuscular lesions as described, likely metastases. Right adrenal lesion is indeterminate but also suspicious for metastatic disease given context. No clear primary malignancy identified in the abdomen or pelvis. END OF IMPRESSION     Chest single frontal view  Result Date: 10/01/2023  Trace right effusion remaining. No evidence of pneumothorax. Small left effusion unchanged. END  OF IMPRESSION     MR cervical spine without and with contrast  Result Date: 10/01/2023  Decreased sensitivity/specificity secondary to patient motion and poor signal on several sequences. Expansile enhancing lesion within the C7 vertebral body with soft tissue components extending into the prevertebral space and the left anterolateral epidural space. Additionally, there is suspected soft tissue component extending into the left C6-C7 and C7-T1 neuroforamen which causes severe left foraminal narrowing at C7-T1 and moderate to severe left foraminal narrowing at C6-C7. Associated trace fluid within the prevertebral space. Overall, findings are suspicious for osseous metastatic disease. Additional enhancing lesions within the left suboccipital musculature at the level of C4. Possible additional subcentimeter lesions within the left paraspinal musculature at the level of C3-C4 and the left posterior neck base. These are suspicious for additional metastatic lesions. Borderline enlarged right level 2 lymph node with slightly rounded morphology. Attention on follow-up imaging is recommended. Prominent STIR hyperintensity along the anteroinferior  endplate of C4 with ill-defined enhancement, favoring degenerative changes. Attention on follow-up imaging is recommended. Partially visualized moderate to large pleural effusions. END OF IMPRESSION     CT chest without contrast  Result Date: 09/29/2023  Moderate right and small left layering pleural fluid, with small fluid tracking along the perifissural regions of the lingula and right lower lobe. This results in atelectasis of the right greater than left lower lobe. Multiple enlarged mediastinal lymph nodes of indeterminate etiology. PET/CT could be considered if clinically appropriate. Follow-up chest CT after resolution of acute symptomology or 8 to 12 weeks is also advised. Scattered pulmonary nodules measuring up to 4 mm, again of indeterminate clinical significance. These can  be reassessed for at the time of follow-up imaging per clinical guidance, or at the time of recommended follow-up chest imaging as above. Right chest wall subcutaneous soft tissue density nodules may represent enlarged lymph nodes. Incidental polycystic configuration of the bilateral kidneys. END OF IMPRESSION I have personally reviewed the images and the Resident's/Fellow's interpretation and agree with or edited the findings.     *Chest standard frontal and lateral views  Result Date: 09/29/2023  Persistent small bilateral pleural effusions with presumed adjacent parenchymal atelectasis, slightly increased.. END OF IMPRESSION I have personally reviewed the images and the Resident's/Fellow's interpretation and agree with or edited the findings.     CT cervical spine without contrast  Result Date: 09/29/2023  Renal osteodystrophy. Asymmetric patchy lucency within the C7 vertebral body extending into the left transverse process and left facet. There is also asymmetric soft tissue prominence in this area compared to the right side. This appears atypical for fracture, however given the patient's left-sided radicular symptoms a cervical spine MRI may be of value to further assess this finding. No obvious spinal canal or significant neuroforaminal narrowing by CT. Large right and moderate left pleural effusions, radiographically occult on prior chest x-ray. This may be related to volume overload in the setting of ESRD. END OF IMPRESSION I have personally reviewed the images and the Resident's/Fellow's interpretation and agree with or edited the findings.     CT head without contrast  Result Date: 09/29/2023  No CT evidence of acute intracranial abnormality. Suspected odontogenic related sinus disease in setting of adjacent periapical lucencies of the maxillary molars. Recommend correlation with dental evaluation. END OF IMPRESSION     *Chest standard frontal and lateral views  Result Date: 09/28/2023  Right basilar airspace  opacity, probably atelectasis in setting of low lung volumes. However, infection or aspiration could have a similar imaging appearance. Minimal blunting of the right costophrenic angle, probably representing a trace pleural effusion. END OF IMPRESSION I have personally reviewed the images and the Resident's/Fellow's interpretation and agree with or edited the findings.                           Other Pending Test Results: Bone scan and MRI brain pending at time of discharge                 CONSULTANT SERVICE    Kathi Agent, MD Nephrology    Lynn Ervin Edelman, MD Ophthalmology    Adina, Barnie, MD Oncology    Virgia Sharper, MD,PhD Radiation Oncology    Bender, Donnice Clap, MD Neurosurgery    Neoma Keen, MD Rheumatology             Signed: Leonor KATHEE Clarity, PA  On: 10/10/2023  at: 2:04 PM

## 2023-10-10 NOTE — Progress Notes (Signed)
 Brief note    Seen and evaluated with attending.  Feels well, no dizziness nor lightheadedness.  Discussed approaching med ready for discharge and ophth appt tomorrow - that he could go home today and follow-up outpt tomorrow, or go home after appt tomorrow. He would like to go home today after his scan. He will follow-up tomorrow.    Plan for discharge today after nuclear scan.  Wife will pick him up.  RW to be delivered to room prior to discharge.  Outpt onc follow-up next week.    BPs are chronically low, if in upper 80s and asymptomatic and walking ad lib without symptoms then still okay for discharge.    No med changes.    If he gets back from scan late and wants to stay overnight, can discontinue the discharge order and then will plan for discharge tomorrow after ophth appointment.     D/w Dr. Tobie.    Leonor KATHEE Clarity, PA  3:42 PM

## 2023-10-10 NOTE — Progress Notes (Signed)
 Diagnostic Imaging Nurse Handoff    Study: MRI  Neuro Status:Alert and Oriented x3, can follow commands and is consentable  Telemetry:No - Patient is not currently on telemetry or cardiac monitoring    IV Access:  Hemodialysis Catheter:  Tunneled Double lumen Left Internal Jugular (Active)   Insertion Site Description Clean;Dry 10/09/23 2000   Phlebitis Scale Grade 0 10/09/23 2000   Infiltration Scale Grade 0 10/09/23 2000   Proximal Lumen Status Capped 10/09/23 2000   Distal Lumen Status Capped 10/09/23 2000   Dressing Type Transparent CHG 10/09/23 2000   Dressing Status Clean, dry, and intact 10/09/23 2000   Dressing Change Changed 10/08/23 0730   (T)Transparent Drsg Change- (Every 7 Days) Dressing changed 10/08/23 0730   Dressing Change Due 10/15/23 10/08/23 1147   Needleless Access Device(s) Changed? Yes 10/08/23 1147   Needleless Access Device(s) Change Due 10/10/23 10/08/23 1147   Heparin /TPA Dwell Yes - Heparin  10/08/23 1147   Freq of line accesses and lab draws addressed? Yes 10/08/23 1147   Extravasation No 10/08/23 1147       Peripheral IV 10/08/23 1322 Ultrasound guided 22 G Anterior;Right Forearm (middle third) (Active)   Phlebitis Scale Grade 0 10/09/23 2000   Infiltration Scale Grade 0 10/09/23 2000   Line Status Capped 10/09/23 2000   Dressing Type Transparent 10/09/23 2000   Dressing Status Clean, dry, and intact 10/09/23 2000     Continuous Infusions:No  Arterial line/transducer present:No.  Additional lines and drains: None  Medication Patches/Insulin  Pumps:No  Precautions:None (Universal)  Patient Readiness for MRI:: Patient dressed in MRI safe scrubs or gown   Transport:Bed  Hover Requested:Yes  Respiratory:Room Air  Sleep Apnea:No  Claustrophobic: No  Pain Concerns:No  Sedation/Pain/Medication Plan :NA  Able to [employer] for the Duration of the Scan:Yes  Code status: Full Code    Devere LITTIE Sprague, RN Received Handoff Report from Jovenel for MRI 2:36 AM

## 2023-10-10 NOTE — Progress Notes (Addendum)
 Hospital Medicine Service Attending Progress Note    Significant Events/Subjective:   - VSS Overnight  - No acute overnight events    - Feels well this morning. Denies lightheadedness, dizziness, CP, SOB or other complaints  - Would like to be discharged  - Reviewed Oncology's plan and need for close outpatient follow-up    Objective:     Physical Exam  Temp:  [36.9 C (98.4 F)-37.1 C (98.8 F)] 36.9 C (98.4 F)  Heart Rate:  [87-98] 98  Resp:  [18-28] 18  BP: (100-143)/(56-88) 143/87       Constitutional: Sitting up in chair with computer on table working, talkative, no acute distress, alert  CV: Regular rate, regular rhythm, no murmur/rub/gallops, no pedal edema  Resp: Normal work of breathing on room air, continue to see improved aeration bilaterally without adventitious lung sounds  GI: Abdomen soft, nontender, somewhat distended, no organomegaly  MSK: Able to move all extremities spontaneously, normal bulk. Some pain with palpation of L shoulder.   Skin: Warm, dry, intact  Neuro: AAOx4  Psych: Good insight, fair mood, reasonable    Recent Lab Studies:  Personally reviewed and notable for:    - No new labs    Recent Labs   Lab 10/09/23  0536 10/08/23  0743 10/07/23  0217   WBC 14.3* 12.9* 11.7*   Hemoglobin 9.8* 9.3* 9.6*   Hematocrit 33* 31* 34*   Platelets 512* 494* 479*      Recent Labs   Lab 10/09/23  0536 10/08/23  0743 10/07/23  0217   Sodium 136 134 134   Potassium 4.3 3.8 4.6   Chloride 100 96 96   CO2 19* 19* 13*   UN 29* 56* 47*   Creatinine 7.05* 10.60* 9.23*   Glucose 101* 92 85   Calcium  8.5* 8.5* 8.4*   Albumin 3.0* 2.9* 3.0*   Phosphorus 2.9 3.4 3.7     No results for input(s): APTT, INR, PTT, PTI in the last 168 hours.      Recent Imaging Studies:  No results found.      Assessment:     Eyoel Throgmorton is a  63 y.o. male with ESRD (on HD TTS), hypotension, and lumbosacral spondylosis who presented to the ED for chest pain and left shoulder pain. Initial work up was notable for mild  leukocytosis, chronic anemia, thrombocytosis, elevated NT-pro BNP, elevated but adynamic troponin, elevated AST and Alk Phos, and ESRD.  Initial chest x-ray demonstrating right sided opacity, concern for atelectasis and minimal right pleural effusion.  He was admitted for constitutional symptoms and workup initially concerning for occult infectious process, rheumatologic disorder or malignancy (completed 5 days of broad-spectrum antibiotics). 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 Bronson) and Radiation Oncology. He did have vertical diplopia, for which Opthalmology was consulted and he will see them tomorrow on clinic. MR head was completed today but read is pending, and need to be followed up with as outpatient, and was ordered to evaluate for metastatic brain disease. His metastatic lesions were in multiple cervical/thoracic/lumbar spine lesions with extension into paraspinal soft tissues, bilateral psoas lesions,  multiple nodal/intramuscular lesions. Overall, he was feeling well at time of discharge, without acute complaints and will need very close outpatient follow-up. If MR head and NM bone scan not resulted, this will need to be followed up as an outpatient.     Plans:   #  Metastatic Stage IV Carcinoma of Renal Origin:   - Appreciate Oncology Recommendations  - Appreciate Neurosurgery Recommendations  - Appreciate Radiation Oncology Recommendations   - Plan to re-engage today for palliative radiation to C7 area  - s/p 5-day course of Vancomycin /Cefepime /Flagyl   - MR Head: Completed but read pending; can be followed up as an outpatient  - Order NM Medicine Bone Scan    #Large right exudative pleural effusion s/p thoracentesis (7/22); Positive for Malignant Cells  Moderate left pleural effusion  - Positive for malignancy as well    Vertical Diplopia  Low concern for GCA, s/p temporal artery biopsy  (7/25)  -Ophthalmology consulted and following  -Rheumatology was consulted at admission for concern of GCA considering blurry vision, s/p temporal artery biopsy with no signs of temporal arteritis.  Rheumatology signed off  -Wonder if there could be infiltration of cranial nerves and if additional brain imaging with MRI would be warranted considering acute onset and setting of potential malignancy.  No obvious tumor seen on CT head  -ESR/CRP trend every 3 days -ESR significantly unchanged since admission, CRP improving    Moderate refeeding syndrome risk  -BMP, mag, Phos every day x 3 days then reevaluate (7/24 - 7/26)  - No repletion's required thus far    Isolated alkaline phosphatase elevation  Could be from bone  -GGT within normal limits, this is from bone    Mild anion gap metabolic acidosis  Hyponatremia  Hypochloremia  Uremia  Hyperkaleima s/p temporizer, resolved  ESRD with HD (T/TH/Sat)  -Reassess after hemodialysis  - Nephrology following closely   - BMP every AM     Normocytic anemia  Likely secondary to ESRD  -Nephrology and holding Epogen    Subacute diarrhea  -Stool studies negative     Additional concerns-  -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 If for primary prevention, will need to discontinue permanently. Will assess further with patient - per prior doc, does not appear to have hx of CVA/MI.  Currently being held in preparation for IR biopsy and GCA biopsy  Allergic rhinitis - Continue flonase     DVT PPx: Heparin  subcu    Emergency Contact: [name] (spouse/HCP)  [phone]   Code Status: Full code    Discharge Planning:   MRDD: Unclear  Discharge Criteria/Barriers to Discharge: Further workup of potential malignancy including biopsy/outpatient connection, rule out cervical osteo  PT and/or OT Recommendations/Discharge to: PT recommending to home with PT.  OT recommending ongoing support for ADLs.  OT notes he had much improvement while hospitalized.   Appointments needed with: PCP, heme-onc, neurosurg, ophthalmology    Case discussed with RN, SW, CC and APP. Discussed with Radiation Oncology.     Candida Vetter S. Tobie, MD  [facility] Attending  [place] [facility] [place]  [place] [id number]

## 2023-10-10 NOTE — Progress Notes (Signed)
 Patient to be discharged today. Patient being discharged to home.    Transportation: family to pick up    IV removed and bleeding stopped.    AVS completed and reviewed with patient. Pt. given the opportunity to ask questions.  Pt signed insurance form.    No discharge medications.  Did go home with rolling walker that was delivered to room    Patient left unit with all belongings including glasses and laptop.  Wearing street clothes.  Transported off unit in wheelchair by floor staff with wife providing ride home.

## 2023-10-10 NOTE — Progress Notes (Signed)
 General Nephrology Progress Note:   LOS: 10 days         Subjective    I personally saw Shane Pearson while the patient was on hemodialysis.         Objective      Vitals:    10/09/23 2212 10/10/23 0815 10/10/23 0830 10/10/23 0900   BP: 143/87 (!) 88/57 90/63 94/59    BP Location: Right leg      Pulse: 98      Resp: 18 18     Temp: 36.9 C (98.4 F) 36.3 C (97.3 F)     TempSrc: Temporal      SpO2: 99% 93% 100% 99%   Weight:       Height:           General: currently on dialysis  , Lying in bed, appears comfortable   Cardiovascular: S1, S2, Regular   Pulmonary: CTA bilaterally, there is no wheezing, rales or rhonchi   Ext: There is no LE edema   Neurological: Sleeping     Labs:  Recent Labs   Lab 10/09/23  0536 10/08/23  0743 10/07/23  0217   Sodium 136 134 134   Potassium 4.3 3.8 4.6   Chloride 100 96 96   CO2 19* 19* 13*   Anion Gap 17* 19* 25*   UN 29* 56* 47*   Creatinine 7.05* 10.60* 9.23*   Glucose 101* 92 85   Calcium  8.5* 8.5* 8.4*   Phosphorus 2.9 3.4 3.7   Magnesium  1.8 1.8 1.8     Recent Labs   Lab 10/10/23  0823 10/09/23  0536 10/08/23  0743   WBC 13.8* 14.3* 12.9*   Hemoglobin 9.4* 9.8* 9.3*   Hematocrit 32* 33* 31*   Platelets 538* 512* 494*     Current Medications:  Scheduled Meds:   diclofenac   2 g Topical BID    lactobacillus rhamnosus (GG)  1 capsule Oral Daily    heparin  (porcine)  5,000 units Subcutaneous TID    polyethylene glycol  17 g Oral Daily    magnesium  oxide  400 mg Oral Daily    atorvastatin   20 mg Oral Daily with dinner    B complex-vitamin C-folic acid   1 tablet Oral Daily    calcium -vitamin D   1 tablet Oral BID    cinacalcet   30 mg Oral Daily    fludrocortisone   0.1 mg Oral TID    fluticasone   1 spray Nasal Daily    midodrine   15 mg Oral TID    omeprazole   40 mg Oral QAM    Lidocaine   2 patch Transdermal Q24H     Continuous Infusions:   heparin  500 Units/hr (10/10/23 0823)     PRN Meds:.acetaminophen , alteplase  injection **AND** alteplase  injection, heparin , heparin , sodium  chloride, dextrose , lidocaine  HCL, heparin  (porcine), heparin  (porcine), sodium chloride , dextrose       Assessment   Shane Pearson is a 63 y.o. 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     Plan   1. CKD Stage 6/ESRD on HD TTS  -  Patient was seen by me personally during the dialysis treatment, the patient is currently on dialysis and tolerating treatment well, continue current treatment as below   Active Dialysis Orders   Dialysis    Hemodialysis     Frequency: Every Tues,Thurs & Sat     Start Date/Time: 10/08/23 9066  Number of Occurrences: Until Specified     Order Questions:      Dialyzer F180      K/Ca (meq) 3K/2.5Ca      Na (meq) 135      Na Modeling None      Bicarb (meq) 30      Dialysate Temp (C) 35      BFR (mL/min) 400      DFR (mL/min) 1.5x BFR      Duration (hours) 4      EDW (kgs) TBD      Access CVC      Fluid removal (L) 0.5-3L , map over 65   - Continue Nephrovite as above  - Follow Chemistry  - Dose adjust meds to CrCL <10  -  Acid base balance is acceptable     2. CKD-MBD  - Calcium  is acceptable, recommend following on dialysis days   - Phosphorous is acceptable, recommend following on dialysis days   - Continue Sensipar  as above     3. Hyperkalemia   - Resolved   - Follow chemistry    4. Metabolic Acidosis   - Mild to moderate and improving with HD   -  Follow chemistry    5. Anemia  - Mild to moderate with Hgb below goal   - Holding ESA given concern for malignancy   - Follow CBC/Hgb levels   - Transfusion as per primary team     6. Volume/Blood Pressure  - Blood pressure is acceptable  - Continue UF with HD as above     7. Access  - HDTC      Please note that sections of this note has been dictated using speech recognition software. Although reasonable attempts at proofreading and editing have been made, please excuse any errors that may have been  missed.      Alm CANDIE Lipps D.O., DWAYNE, FASN  Associate Professor of Clinical Medicine  [facility]       Electronically Signed on 10/10/2023 at 9:18 AM

## 2023-10-10 NOTE — Progress Notes (Signed)
 Radiation Oncology Follow-up Clinic Note (10/10/2023)    Patient identification and Diagnosis: Shane Pearson is a 63 y.o. male who was recently found to have diffusely metastatic disease of renal origin after presenting to the hospital on 09/28/23. 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 We are seeing him to discuss palliative RT to the C7 vertebral body.      Interval History:   History of Present Illness    Shane Pearson was seen in HD with his wife on the phone. He reports that his L shoulder pain briefly worsened yesterday, but he feels like it is better today. He is otherwise doing ok.    Medications:   Current Facility-Administered Medications   Medication Dose Route Frequency    diclofenac  (VOLTAREN ) 1 % gel 2 g  2 g Topical BID    lactobacillus rhamnosus (GG) (CULTURELLE) capsule 1 each  1 capsule Oral Daily    acetaminophen  (TYLENOL ) tablet 1,000 mg  1,000 mg Oral Q8H PRN    alteplase  (CATHFLO, ACTIVASE ) injection 0-1 mg  0-1 mg Intracatheter PRN    And    alteplase  (CATHFLO, ACTIVASE ) injection 0-1 mg  0-1 mg Intracatheter PRN    heparin  1,000 units/mL bolus from syringe 1,000 units  1,000 units Dialysis PRN    heparin  1,000 units/mL syringe (for Dialysis)  500 Units/hr Dialysis Continuous PRN    sodium chloride  0.9 % FLUSH REQUIRED IF PATIENT HAS IV  0-500 mL/hr Intravenous PRN    dextrose  5 % FLUSH REQUIRED IF PATIENT HAS IV  0-500 mL/hr Intravenous PRN    lidocaine  HCL 1 % injection 0.1 mL  0.1 mL Subcutaneous PRN    heparin  (porcine) SQ injection 5,000 units  5,000 units Subcutaneous TID    polyethylene glycol (GLYCOLAX ,MIRALAX ) powder 17 g  17 g Oral Daily    magnesium  oxide (MAG-OX) tablet 400 mg  400 mg Oral Daily    heparin  (porcine) 1,000 units/mL injection 0-5,000 units  0-5 mL Intracatheter PRN    heparin  (porcine) 1,000 units/mL injection 0-5,000 units  0-5 mL Intracatheter PRN    sodium chloride  0.9 % FLUSH REQUIRED IF PATIENT HAS IV  0-500 mL/hr  Intravenous PRN    dextrose  5 % FLUSH REQUIRED IF PATIENT HAS IV  0-500 mL/hr Intravenous PRN    atorvastatin  (LIPITOR) tablet 20 mg  20 mg Oral Daily with dinner    B complex-vitamin C-folic acid  (NEPHRO-VITE) tablet 1 tablet  1 tablet Oral Daily    calcium -vitamin D  (OSCAL-500) 500mg -200unit per tablet 1 tablet  1 tablet Oral BID    cinacalcet  (SENSIPAR ) tablet 30 mg  30 mg Oral Daily    fludrocortisone  (FLORINEF ) tablet 0.1 mg  0.1 mg Oral TID    fluticasone  (FLONASE ) 50 MCG/ACT nasal spray 1 spray  1 spray Nasal Daily    midodrine  (PROAMATINE ) tablet 15 mg  15 mg Oral TID    omeprazole  (PriLOSEC) capsule 40 mg  40 mg Oral QAM    Lidocaine  4 % patch 2 patch  2 patch Transdermal Q24H       Allergies:  Allergies[1]    Review of systems: As per HPI/Interval History. Otherwise negative.      Physical Examination:   Vitals:    10/10/23 1100   BP: (!) 87/62   Pulse:    Resp:    Temp:    Height:     OR    There were no vitals filed for this visit.  Performance Status: Score-60- Required occasional assistance but is able to care for most of  His needs.  Physical Exam  Constitutional:       General: He is not in acute distress.  HENT:      Head: Normocephalic and atraumatic.   Eyes:      General: No scleral icterus.        Right eye: No discharge.         Left eye: No discharge.   Cardiovascular:      Rate and Rhythm: Normal rate.   Pulmonary:      Effort: Pulmonary effort is normal. No respiratory distress.   Abdominal:      General: Abdomen is flat. There is no distension.   Neurological:      Mental Status: He is alert.   Psychiatric:         Mood and Affect: Mood normal.       Physical Exam         Studies Reviewed:   No results found.  Results         Assessment: Shane Pearson is a 63 y.o. male who was recently found to have diffusely metastatic disease of renal origin after presenting to the hospital on 09/28/23. 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 We are seeing him  to discuss palliative RT to the C7 vertebral body.    Reviewed goals, side effects, risks, and benefits of RT to the met in C7 vertebral body. We discussed the palliative nature of RT with the goal of decreasing the shooting pain in his L shoulder and arm. After discussion with Shane Pearson and his wife, he decided that he does not currently want to treat the pain, and he just wants to pursue systemic therapy to control disease as a whole.    Counseled patient to contact us  if his pain worsens or he changes his mind. We can treat him at our [place] location to be closer to home if needed. Informed patient that if MR brain shows mets, then we will discuss treating with RT.    Assessment & Plan         Plan:  Shane Pearson does not wish to pursue palliative RT at this time  If pain worsens or patient changes his mind, we remain available to treat  Will follow MR brain results    Shane Pearson was encouraged to call with questions or concerns in the interim period.        Shane Molt, MD  PGY-2 Resident  Radiation Oncology  10/10/2023    The patient was also seen and evaluated by Dr. Virgia. Formal attending attestation to follow.          [1]   Allergies  Allergen Reactions    Penicillins Swelling     Face    Coconut Flavoring Agent (Non-Screening) Nausea And Vomiting     All sources coconut (including raw) --causes regurgitation     Amoxicillin Rash    Baclofen Other (See Comments)     confusion    No Known Latex Allergy

## 2023-10-10 NOTE — Interdisciplinary Rounds (Signed)
 Interdisciplinary Discharge Communication Note    10/07/2023    Targeted Discharge Disposition : Home with services    Rounding was performed on: IDR Date: 10/10/23 at: IDR Time: 1030    Interdisciplinary Rounding Participants: APP, SW, Care Coordinator, Charge RN, Engineer, drilling, PT, Attending    Admit Date/Time:  09/28/2023  3:29 PM     Principal Problem:  <principal problem not specified>  The patient's problem list and interdisciplinary care plan was reviewed.    EDD:         Discharge Planning    Targeted Disposition   Targeted Discharge Disposition : Home with services    Home Care  Home Care, Choiced?: Yes   Accepting Home Care Agency: [facility]       IV Infusion                Medical Equipment Needs  Current Home Equipment: Adjustable bed, Grab bars in shower/tub, Shower chair, Crutches         Equipment/Supplies Ordered: Agricultural consultant   Equipment/Supply Vendor: [employer]   Vendor Phone: [phone]                                   Communication                Current PCP:  Juliene Debby PARAS, MD    Scheduled Future Appointments:  Follow-up Appointments: Follow-up appointment with PCP made     Future Appointments      Thursday October 10, 2023 3:30 PM  (Arrive by 3:15 PM)  NM Bone Scan Whole Body with RIS, [facility] NM1 (NM1)  [facility])  Arrive at: Imaging Check-In at [facility] [id number] [phone]     Friday October 11, 2023 3:15 PM  POST OP (NC) with Laneta Lynn Hanks, MD  [facility] (--) [phone]     Thursday October 17, 2023 2:00 PM  FOLLOW UP VISIT with Filiberto Fall, MD  [facility] [place] (--) [phone]              Transportation               Prescriptions             Ozell Fridge, RN  1:50 PM

## 2023-10-11 ENCOUNTER — Telehealth: Payer: Self-pay | Admitting: Radiation Oncology

## 2023-10-11 ENCOUNTER — Encounter: Payer: Self-pay | Admitting: Gastroenterology

## 2023-10-11 ENCOUNTER — Other Ambulatory Visit: Payer: Self-pay

## 2023-10-11 ENCOUNTER — Ambulatory Visit

## 2023-10-11 DIAGNOSIS — Z9889 Other specified postprocedural states: Secondary | ICD-10-CM | POA: Insufficient documentation

## 2023-10-11 MED ORDER — NEOMYCIN-POLYMYXIN-DEXAMETH 3.5-10000-0.1 OP OINT *I*
TOPICAL_OINTMENT | Freq: Three times a day (TID) | OPHTHALMIC | Status: DC
Start: 2023-10-11 — End: 2023-10-14

## 2023-10-11 NOTE — Progress Notes (Signed)
 Subjective:   Subjective  10/11/2023   Chief Complaint   Patient presents with    Follow-up    Post-op      1 WK S/P TAB OD OR OS     HPI     Post-op     Additional comments:  1 WK S/P TAB OD OR OS           Comments    63 y.o. male who was recently found to have diffusely metastatic disease   of renal origin after presenting to the hospital on 09/28/23. We performed   a right TAB due to due to progressive left shoulder pain, myalgia,   fatigue, weight loss as well as new temporal headaches. Inflammatory   markers were consistently high for the last weeks and is explained by his   metastatic cancer. He is not on prednisone.  He endorses diplopia.    1 week post TAB.       FINAL DIAGNOSIS:   Temporal artery, right, biopsy:   - Artery with no specific histologic abnormality   - Essentially no inflammation   - No histologic evidence of giant cell arteritis identified   - See comment         COMMENT:   Preliminary results conveyed to Jinny Person, MD on 10/05/23 at 12:18.       Patient reports his vision is good in OU at baseline. Patient is not using   the ointment (patient reports he was never given an ointment). Patient   denies any pain from the surgery or ocular pain. Patient denies any   noticeable changes in central vision, color vision and peripheral vision.   Patient denies  dimming of vision or transient obscurations of vision.   Patient is still experiencing diplopia with no changes. He reports any new   medical diagnosis or changes (patient reports he was diagnosed with kidney   cancer yesterday)            Last edited by Lynn Hanks, Laneta, MD on 10/11/2023  4:39 PM.        Current Medications[1]  Penicillins, Coconut flavoring agent (non-screening), Amoxicillin, Baclofen, and No known latex allergy   Past Medical History[2]   Past Surgical History[3]   Tobacco Use History[4]   Social History     Substance and Sexual Activity   Alcohol Use No      Social History     Substance and Sexual Activity   Drug  Use No     Family History[5]  Specialty Problems    None       ROS    Positive for: Eyes  Negative for: Constitutional, Gastrointestinal, Neurological, Skin,   Genitourinary, Musculoskeletal, HENT, Endocrine, Cardiovascular,   Respiratory, Psychiatric, Allergic/Imm, Heme/Lymph  Last edited by Leni Pauling on 10/11/2023  3:47 PM.        Objective:   Objective  There were no vitals filed for this visit.    Base Eye Exam       Visual Acuity (Snellen - Linear)         Right Left    Dist cc 20/50 -2 20/40 -1    Dist ph cc 20/40 -1 20/30 -1    Near cc J3-2 J1-1      Correction: Glasses              Tonometry (Tonopen, 3:52 PM)         Right Left    Pressure 9 13  Neuro/Psych       Oriented x3: Yes    Mood/Affect: Normal                  Strabismus Exam       Method: Alternate cover    Correction: cc      Distance Near Near +3DS N Bifocals                      0 0 0   0 0 0            RHT 4           0  0   0  0       RHT 4     RHT 4     RHT 4      0 0 0   0 0 0          RHT 6         R Tilt L Tilt       RHT 4 RHT 4                      Slit Lamp and Fundus Exam       External Exam         Right Left    External c.d.i                   Refraction       Final Rx         Vert Prism     Right 4 PD BD Fresnel prism     Left                   Neurologic Exam [facility]  Final Rx         Vert Prism     Right 4 PD BD Fresnel prism     Left                   No annotated images are attached to the encounter.            Assessment/Plan:   Assessment History of temporal artery biopsy  64 y.o. male who was recently found to have diffusely metastatic disease of renal origin after presenting to the hospital on 09/28/23. We performed a right TAB due to due to progressive left shoulder pain, myalgia, fatigue, weight loss as well as new temporal headaches. Inflammatory markers were consistently high for the last weeks and is explained by his metastatic cancer. He is not on prednisone. He endorses diplopia. 1 week post TAB. FINAL DIAGNOSIS:  Temporal artery, right, biopsy: - Artery with no specific histologic abnormality - Essentially no inflammation - No histologic evidence of giant cell arteritis identified - See comment     Wound is clean dry and intact. He will use maxitrol  TID over the surgical wound.    Of note, he has a comitant right hypertropia. I will give him a Fresnel prism  and we will regroup in 2 months to consider a ground in prescription.       Patient was seen and examined.  Findings, assessment, and plan were discussed in detail with the patient, who verbalized understanding of and agreement with plan. All questions were answered. The patient was instructed to call or come in if there are any new symptoms or existing symptoms persist or worsen. To call if questions or concerns: [phone], number provided. Follow up was arranged.  Certain parts of this note may have been carried over from prior Ophthalmology notes to maintain accuracy of patient's pertinent medical history and continuity of care. The details were verified and edited as appropriate.              [1]   Current Outpatient Medications:     walker, Exact equipment needed: Rolling walker  Specific length of time needed: Lifetime ICD10 Code: R26.2  Ht: 5' 5   Wt: 175 lbs, Disp: 1 each, Rfl: 0    midodrine  (PROAMATINE ) 5 mg tablet, Take 3 tablets (15 mg total) by mouth 3 times daily., Disp: 270 tablet, Rfl: 5    magnesium  oxide (MAGNESIUM  OXIDE -MG SUPPLEMENT) 400 (241.3 mg) mg tablet, Take 1 tablet (400 mg total) by mouth daily., Disp: 90 tablet, Rfl: 3    acetaminophen  (TYLENOL ) 325 MG tablet, Take 2 tablets (650 mg total) by mouth every 4-6 hours as needed for Pain., Disp: , Rfl:     aspirin  81 MG EC tablet, Take 1 tablet (81 mg total) by mouth daily., Disp: , Rfl:     fluticasone  (FLONASE ) 50 MCG/ACT nasal spray, Spray 1 spray into nostril daily., Disp: , Rfl:     atorvastatin  (LIPITOR) 10 MG tablet, Take 2 tablets (20 mg total) by mouth daily (with dinner)., Disp: ,  Rfl:     fludrocortisone  (FLORINEF ) 0.1 mg tablet, Take 1 tablet (0.1 mg total) by mouth 3 times daily., Disp: , Rfl:     b complex-vitamin c-folic acid  (NEPHRO-VITE) tablet, Take 1 tablet by mouth daily., Disp: , Rfl:     calcium -vitamin D  (OSCAL-500) 500-200 MG-UNIT per tablet, Take 1 tablet by mouth 2 times daily., Disp: , Rfl:     cinacalcet  (SENSIPAR ) 30 MG tablet, Take 1 tablet (30 mg total) by mouth daily., Disp: , Rfl:     omeprazole  (PRILOSEC) 20 MG capsule, Take 2 capsules (40 mg total) by mouth daily (before breakfast)., Disp: , Rfl:     Neomycin -Polymyxin-Dexameth (MAXITROL ) 3.5-10000-0.1 OINT, Place into the right eye 3 times daily for 7 days for Post op - put 3 times a day over surgical wound for a week.., Disp: , Rfl:   [2]   Past Medical History:  Diagnosis Date    Arthritis     CHF (congestive heart failure)     Colon polyp     Dialysis patient     ESRD (end stage renal disease)     GERD (gastroesophageal reflux disease)     Hypercholesterolemia     Hypotension     Ischemic colitis     Thyroid disease     hypo   [3]   Past Surgical History:  Procedure Laterality Date    DIALYSIS FISTULA CREATION Bilateral     4 fistulas have failed- ? d/t low BP and small veins    HERNIA REPAIR      PR LIGATION/BIOPSY TEMPORAL ARTERY Right 10/04/2023    Procedure: TEMPORAL ARTERY BIOPSY RIGHT EYE;  Surgeon: Lynn Ervin Edelman, MD;  Location: [facility] MAIN OR;  Service: Ophthalmology    Right inguinal hernia repair     [4]   Social History  Tobacco Use   Smoking Status Former    Packs/day: 0.30    Years: 15.00    Additional pack years: 0.00    Total pack years: 4.50    Types: Cigarettes    Quit date: 08/02/1992    Years since quitting: 31.2   Smokeless Tobacco Never   [5]  Family History  Problem Relation Name Age of Onset    Diabetes Mother      Asthma Mother      Coronary artery disease Father

## 2023-10-11 NOTE — Assessment & Plan Note (Signed)
 63 y.o. male who was recently found to have diffusely metastatic disease of renal origin after presenting to the hospital on 09/28/23. We performed a right TAB due to due to progressive left shoulder pain, myalgia, fatigue, weight loss as well as new temporal headaches. Inflammatory markers were consistently high for the last weeks and is explained by his metastatic cancer. He is not on prednisone. He endorses diplopia. 1 week post TAB. FINAL DIAGNOSIS: Temporal artery, right, biopsy: - Artery with no specific histologic abnormality - Essentially no inflammation - No histologic evidence of giant cell arteritis identified - See comment     Wound is clean dry and intact. He will use maxitrol  TID over the surgical wound.    Of note, he has a comitant right hypertropia. I will give him a Fresnel prism  and we will regroup in 2 months to consider a ground in prescription.

## 2023-10-11 NOTE — Telephone Encounter (Signed)
 Radiation Oncology Note          I called Mr Bedoy to review his brain MRI scan findings as I suspected (and he confirmed) that he was not given any results prior to discharge.     Numerous multifocal areas of punctate enhancement scattered throughout the brain parenchyma. This is consistent with intracranial metastatic disease. No significant mass effect and no evidence of associated hemorrhagic changes.     On my review there is also some fullness in the cavernous sinus bilaterally that may account for his diplopia, though I reached out to the radiologist for their input.    We discussed:    - C7 metastasis - he opted to hold off on radiation though we can consider treatment for pain control or neuropathic symptoms     - brain metastases - we can offer SRS to the many lesions vs a short interval MRI to assess disease course (with the latter being an option if he is a candidate for immunotherapy or CNS penetrant systemic therapy)     - possible skull base/cavernous sinus metastasis (if radiologist concurs with this) - can consider radiation     - any other bony sites can also be treated with radiation (I.e. right humerus seen on bone scan).     Bony sites outside of the calvarium could be treated at [facility].  Brain or skull base lesions would be better treated at [facility] due to the equipment at [facility].     I will discuss with Dr Bronson as well

## 2023-10-14 ENCOUNTER — Other Ambulatory Visit: Payer: Self-pay

## 2023-10-14 DIAGNOSIS — H532 Diplopia: Secondary | ICD-10-CM

## 2023-10-14 MED ORDER — NEOMYCIN-POLYMYXIN-DEXAMETH 3.5-10000-0.1 OP OINT *I*
TOPICAL_OINTMENT | Freq: Three times a day (TID) | OPHTHALMIC | 0 refills | Status: DC
Start: 2023-10-14 — End: 2023-10-21

## 2023-10-14 NOTE — Telephone Encounter (Signed)
 Referral signed.

## 2023-10-14 NOTE — Telephone Encounter (Signed)
 Please try to resend the Maxitrol  ung to the [facility] on [address] Pharmacy told pt the have not received it.

## 2023-10-14 NOTE — Patient Instructions (Signed)
 Your Care Team          Phone:  [phone]      Fax:   [phone]        Physician:    Blaine Bump, MD      Nurse Practitioner: Bretta Camp, NP      Nurses:    Meriel Stank, RN     Marcelene Sep, RN      Secretary:    Lisa Rideau      Social Worker:   Marien Short, LMSW      Pharmacist:  Basilia Bosworth, PharmD    APPOINTMENT LOCATIONS:   [facility] - [address], Second Floor of Cancer Center  Tuesday's and Thursday's - [address], Lobby Floor of Cancer Center   Wednesday's- [address] (Dr. Cynthia Dries), [address] Bobbye Burrow, NP)    You can reach us  at [phone]. Please be prepared to let the secretary know why you are calling.  This will help let your nurse know how urgent the call is.   We will return your call as soon as possible, on clinic days it may take us  longer. After hours, weekends and holidays the phone is answered by the answering service. They will take a message and forward to the doctor on call who can access your chart and assist you. Please note that mychart messages are NOT monitored after business hours, on weekends or holidays. Please CALL for urgent matters.     Please call this same number if you need to cancel or re-schedule your appointment.  Please allow 5 days to re-fill any prescriptions and 5-10 days to complete disability or FMLA paperwork.

## 2023-10-15 ENCOUNTER — Other Ambulatory Visit: Payer: Self-pay | Admitting: Family

## 2023-10-15 DIAGNOSIS — R630 Anorexia: Secondary | ICD-10-CM

## 2023-10-15 MED ORDER — MIRTAZAPINE 7.5 MG PO TABS *A*
7.5000 mg | ORAL_TABLET | Freq: Every evening | ORAL | 11 refills | Status: DC
Start: 2023-10-15 — End: 2023-10-18

## 2023-10-15 NOTE — Telephone Encounter (Signed)
Referral faxed to [facility].

## 2023-10-16 NOTE — Patient Instructions (Signed)
Continue taking your medications as prescribed.     Check fasting labs 1-2 weeks before your next appointment.

## 2023-10-17 ENCOUNTER — Ambulatory Visit: Attending: Oncology | Admitting: Oncology

## 2023-10-17 ENCOUNTER — Emergency Department

## 2023-10-17 ENCOUNTER — Encounter: Payer: Self-pay | Admitting: Oncology

## 2023-10-17 ENCOUNTER — Encounter: Payer: Self-pay | Admitting: Internal Medicine

## 2023-10-17 ENCOUNTER — Observation Stay
Admission: EM | Admit: 2023-10-17 | Discharge: 2023-10-18 | Disposition: A | Discharge status: Home / Self Care | Source: Ambulatory Visit | Attending: Emergency Medicine | Admitting: Emergency Medicine

## 2023-10-17 ENCOUNTER — Other Ambulatory Visit: Payer: Self-pay

## 2023-10-17 ENCOUNTER — Inpatient Hospital Stay

## 2023-10-17 VITALS — BP 84/62 | HR 95 | Temp 97.9°F | Resp 18

## 2023-10-17 DIAGNOSIS — C649 Malignant neoplasm of unspecified kidney, except renal pelvis: Secondary | ICD-10-CM | POA: Insufficient documentation

## 2023-10-17 DIAGNOSIS — E785 Hyperlipidemia, unspecified: Secondary | ICD-10-CM | POA: Insufficient documentation

## 2023-10-17 DIAGNOSIS — C7931 Secondary malignant neoplasm of brain: Secondary | ICD-10-CM | POA: Insufficient documentation

## 2023-10-17 DIAGNOSIS — I959 Hypotension, unspecified: Secondary | ICD-10-CM | POA: Insufficient documentation

## 2023-10-17 DIAGNOSIS — C799 Secondary malignant neoplasm of unspecified site: Secondary | ICD-10-CM

## 2023-10-17 DIAGNOSIS — R0989 Other specified symptoms and signs involving the circulatory and respiratory systems: Secondary | ICD-10-CM

## 2023-10-17 DIAGNOSIS — Z789 Other specified health status: Secondary | ICD-10-CM

## 2023-10-17 DIAGNOSIS — Q613 Polycystic kidney, unspecified: Secondary | ICD-10-CM | POA: Insufficient documentation

## 2023-10-17 DIAGNOSIS — Z7982 Long term (current) use of aspirin: Secondary | ICD-10-CM | POA: Insufficient documentation

## 2023-10-17 DIAGNOSIS — Z79899 Other long term (current) drug therapy: Secondary | ICD-10-CM | POA: Insufficient documentation

## 2023-10-17 DIAGNOSIS — M79602 Pain in left arm: Secondary | ICD-10-CM

## 2023-10-17 DIAGNOSIS — M47814 Spondylosis without myelopathy or radiculopathy, thoracic region: Secondary | ICD-10-CM

## 2023-10-17 DIAGNOSIS — R9431 Abnormal electrocardiogram [ECG] [EKG]: Secondary | ICD-10-CM

## 2023-10-17 DIAGNOSIS — R29898 Other symptoms and signs involving the musculoskeletal system: Principal | ICD-10-CM

## 2023-10-17 DIAGNOSIS — R918 Other nonspecific abnormal finding of lung field: Secondary | ICD-10-CM

## 2023-10-17 DIAGNOSIS — R531 Weakness: Principal | ICD-10-CM | POA: Insufficient documentation

## 2023-10-17 DIAGNOSIS — R627 Adult failure to thrive: Secondary | ICD-10-CM | POA: Insufficient documentation

## 2023-10-17 DIAGNOSIS — M898X8 Other specified disorders of bone, other site: Secondary | ICD-10-CM

## 2023-10-17 DIAGNOSIS — I130 Hypertensive heart and chronic kidney disease with heart failure and stage 1 through stage 4 chronic kidney disease, or unspecified chronic kidney disease: Secondary | ICD-10-CM | POA: Insufficient documentation

## 2023-10-17 DIAGNOSIS — M50123 Cervical disc disorder at C6-C7 level with radiculopathy: Secondary | ICD-10-CM | POA: Insufficient documentation

## 2023-10-17 DIAGNOSIS — Z87891 Personal history of nicotine dependence: Secondary | ICD-10-CM | POA: Insufficient documentation

## 2023-10-17 DIAGNOSIS — I517 Cardiomegaly: Secondary | ICD-10-CM

## 2023-10-17 DIAGNOSIS — C7951 Secondary malignant neoplasm of bone: Secondary | ICD-10-CM | POA: Insufficient documentation

## 2023-10-17 DIAGNOSIS — E039 Hypothyroidism, unspecified: Secondary | ICD-10-CM | POA: Insufficient documentation

## 2023-10-17 DIAGNOSIS — M47816 Spondylosis without myelopathy or radiculopathy, lumbar region: Secondary | ICD-10-CM

## 2023-10-17 DIAGNOSIS — K219 Gastro-esophageal reflux disease without esophagitis: Secondary | ICD-10-CM | POA: Insufficient documentation

## 2023-10-17 DIAGNOSIS — I5030 Unspecified diastolic (congestive) heart failure: Secondary | ICD-10-CM | POA: Insufficient documentation

## 2023-10-17 DIAGNOSIS — M47812 Spondylosis without myelopathy or radiculopathy, cervical region: Secondary | ICD-10-CM

## 2023-10-17 DIAGNOSIS — D72829 Elevated white blood cell count, unspecified: Secondary | ICD-10-CM | POA: Insufficient documentation

## 2023-10-17 DIAGNOSIS — I4581 Long QT syndrome: Secondary | ICD-10-CM | POA: Insufficient documentation

## 2023-10-17 DIAGNOSIS — C801 Malignant (primary) neoplasm, unspecified: Secondary | ICD-10-CM | POA: Insufficient documentation

## 2023-10-17 DIAGNOSIS — E8729 Other acidosis: Secondary | ICD-10-CM | POA: Insufficient documentation

## 2023-10-17 DIAGNOSIS — E0590 Thyrotoxicosis, unspecified without thyrotoxic crisis or storm: Secondary | ICD-10-CM | POA: Insufficient documentation

## 2023-10-17 DIAGNOSIS — Z880 Allergy status to penicillin: Principal | ICD-10-CM

## 2023-10-17 DIAGNOSIS — Z6832 Body mass index (BMI) 32.0-32.9, adult: Secondary | ICD-10-CM | POA: Insufficient documentation

## 2023-10-17 DIAGNOSIS — R630 Anorexia: Secondary | ICD-10-CM | POA: Insufficient documentation

## 2023-10-17 DIAGNOSIS — D631 Anemia in chronic kidney disease: Secondary | ICD-10-CM | POA: Insufficient documentation

## 2023-10-17 DIAGNOSIS — N186 End stage renal disease: Secondary | ICD-10-CM | POA: Insufficient documentation

## 2023-10-17 DIAGNOSIS — M6289 Other specified disorders of muscle: Secondary | ICD-10-CM

## 2023-10-17 DIAGNOSIS — Z992 Dependence on renal dialysis: Secondary | ICD-10-CM | POA: Insufficient documentation

## 2023-10-17 DIAGNOSIS — I509 Heart failure, unspecified: Secondary | ICD-10-CM

## 2023-10-17 LAB — TSH: TSH: 13.1 u[IU]/mL — ABNORMAL HIGH (ref 0.27–4.20)

## 2023-10-17 LAB — RBC MORPHOLOGY

## 2023-10-17 LAB — CBC AND DIFFERENTIAL
Baso # K/uL: 0.1 THOU/uL (ref 0.0–0.2)
Eos # K/uL: 0.2 THOU/uL (ref 0.0–0.5)
Hematocrit: 37 % (ref 37–52)
Hemoglobin: 10.9 g/dL — ABNORMAL LOW (ref 12.0–17.0)
IMM Granulocytes #: 0.2 THOU/uL — ABNORMAL HIGH
IMM Granulocytes: 1.3 %
Lymph # K/uL: 1.3 THOU/uL (ref 1.0–5.0)
MCV: 87 fL (ref 75–100)
Mono # K/uL: 1.5 THOU/uL — ABNORMAL HIGH (ref 0.1–1.0)
Neut # K/uL: 10.2 THOU/uL — ABNORMAL HIGH (ref 1.5–6.5)
Nucl RBC # K/uL: 0.1 THOU/uL — ABNORMAL HIGH
Nucl RBC %: 1 /100{WBCs} — ABNORMAL HIGH (ref 0.0–0.2)
Platelets: 551 THOU/uL — ABNORMAL HIGH (ref 150–450)
RBC: 4.3 MIL/uL (ref 4.0–6.0)
RDW: 22.8 % — ABNORMAL HIGH (ref 0.0–15.0)
Seg Neut %: 75.8 %
WBC: 13.4 THOU/uL — ABNORMAL HIGH (ref 3.5–11.0)

## 2023-10-17 LAB — BASIC METABOLIC PANEL
Anion Gap: 18 — ABNORMAL HIGH (ref 7–16)
CO2: 29 mmol/L — ABNORMAL HIGH (ref 20–28)
Calcium: 9.2 mg/dL (ref 8.6–10.2)
Chloride: 90 mmol/L — ABNORMAL LOW (ref 96–108)
Creatinine: 4.1 mg/dL — ABNORMAL HIGH (ref 0.67–1.17)
Glucose: 101 mg/dL — ABNORMAL HIGH (ref 60–99)
Lab: 20 mg/dL (ref 6–20)
Potassium: 4.7 mmol/L (ref 3.3–5.1)
Sodium: 137 mmol/L (ref 133–145)
eGFR BY CREAT: 15 — AB

## 2023-10-17 LAB — RUQ PANEL (ED ONLY)
ALT: 10 U/L (ref 0–50)
Albumin: 3.6 g/dL (ref 3.5–5.2)
Alk Phos: 235 U/L — ABNORMAL HIGH (ref 40–130)
Amylase: 117 U/L — ABNORMAL HIGH (ref 28–100)
Bilirubin,Direct: <0.2 mg/dL (ref 0.0–0.3)
Bilirubin,Total: 0.3 mg/dL (ref 0.0–1.2)
Lipase: 47 U/L (ref 13–60)
Total Protein: 7.1 g/dL (ref 6.3–7.7)

## 2023-10-17 LAB — FERRITIN: Ferritin: 881 ng/mL — ABNORMAL HIGH (ref 20–250)

## 2023-10-17 LAB — MAGNESIUM: Magnesium: 1.8 mg/dL (ref 1.6–2.5)

## 2023-10-17 LAB — LACTATE, VENOUS, WHOLE BLOOD
Lactate VEN,WB: 3.9 mmol/L (ref 0.5–2.2)
Lactate VEN,WB: 3.4 mmol/L — ABNORMAL HIGH (ref 0.5–2.2)

## 2023-10-17 LAB — PTH, INTACT: Intact PTH: 309 pg/mL — ABNORMAL HIGH (ref 15.0–65.0)

## 2023-10-17 LAB — VITAMIN B12: Vitamin B12: 870 pg/mL (ref 232–1245)

## 2023-10-17 LAB — PHOSPHORUS: Phosphorus: 2.4 mg/dL — ABNORMAL LOW (ref 2.7–4.5)

## 2023-10-17 LAB — FOLATE

## 2023-10-17 LAB — T4, FREE: Free T4: 0.6 ng/dL — ABNORMAL LOW (ref 0.9–1.7)

## 2023-10-17 MED ORDER — ATORVASTATIN CALCIUM 20 MG PO TABS *I*
20.0000 mg | ORAL_TABLET | Freq: Every day | ORAL | Status: DC
Start: 2023-10-18 — End: 2023-10-18

## 2023-10-17 MED ORDER — GADOPICLENOL 0.5 MMOL/ML (VUEWAY) IV SOLN *I*
9.0000 mL | Freq: Once | INTRAVENOUS | Status: AC
Start: 2023-10-17 — End: 2023-10-17
  Administered 2023-10-17: 9 mL via INTRAVENOUS

## 2023-10-17 MED ORDER — FLUDROCORTISONE ACETATE 100 MCG PO TABS *I*
100.0000 ug | ORAL_TABLET | Freq: Three times a day (TID) | ORAL | Status: DC
Start: 2023-10-17 — End: 2023-10-17
  Administered 2023-10-17: 100 ug via ORAL
  Filled 2023-10-17: qty 1

## 2023-10-17 MED ORDER — ENOXAPARIN SODIUM 30 MG/0.3ML IJ SOSY *I*
30.0000 mg | PREFILLED_SYRINGE | Freq: Every day | INTRAMUSCULAR | Status: DC
Start: 2023-10-17 — End: 2023-10-18
  Administered 2023-10-17: 30 mg via SUBCUTANEOUS
  Filled 2023-10-17: qty 0.3

## 2023-10-17 MED ORDER — DEXTROSE 5 % FLUSH FOR PUMPS *I*
0.0000 mL/h | INTRAVENOUS | Status: DC | PRN
Start: 2023-10-17 — End: 2023-10-17

## 2023-10-17 MED ORDER — POLYETHYLENE GLYCOL 3350 PO PACK 17 GM *I*
17.0000 g | PACK | Freq: Every day | ORAL | Status: DC | PRN
Start: 2023-10-17 — End: 2023-10-18

## 2023-10-17 MED ORDER — ASPIRIN 81 MG PO TBEC *I*
81.0000 mg | DELAYED_RELEASE_TABLET | Freq: Every day | ORAL | Status: DC
Start: 2023-10-18 — End: 2023-10-18
  Administered 2023-10-18: 81 mg via ORAL
  Filled 2023-10-17: qty 1

## 2023-10-17 MED ORDER — SODIUM CHLORIDE 0.9 % FLUSH FOR PUMPS *I*
0.0000 mL/h | INTRAVENOUS | Status: DC | PRN
Start: 2023-10-17 — End: 2023-10-18

## 2023-10-17 MED ORDER — PANTOPRAZOLE SODIUM 40 MG PO TBEC *I*
40.0000 mg | DELAYED_RELEASE_TABLET | Freq: Every morning | ORAL | Status: DC
Start: 2023-10-18 — End: 2023-10-18
  Administered 2023-10-18: 40 mg via ORAL
  Filled 2023-10-17: qty 1

## 2023-10-17 MED ORDER — NEPHRO-VITE 0.8 MG PO TABS *I*
1.0000 | ORAL_TABLET | Freq: Every day | ORAL | Status: DC
Start: 2023-10-18 — End: 2023-10-18
  Administered 2023-10-18: 1 via ORAL
  Filled 2023-10-17: qty 1

## 2023-10-17 MED ORDER — MIDODRINE HCL 5 MG PO TABS *I*
15.0000 mg | ORAL_TABLET | Freq: Three times a day (TID) | ORAL | Status: DC
Start: 2023-10-17 — End: 2023-10-18
  Administered 2023-10-17 – 2023-10-18 (×3): 15 mg via ORAL
  Filled 2023-10-17 (×3): qty 3

## 2023-10-17 MED ORDER — MAGNESIUM-OXIDE 400 (241.3 MG) MG PO TABS *WRAPPED*
400.0000 mg | ORAL_TABLET | Freq: Every day | ORAL | Status: DC
Start: 2023-10-18 — End: 2023-10-18
  Administered 2023-10-18: 400 mg via ORAL
  Filled 2023-10-17: qty 1

## 2023-10-17 MED ORDER — FLUDROCORTISONE ACETATE 100 MCG PO TABS *I*
0.1000 mg | ORAL_TABLET | Freq: Three times a day (TID) | ORAL | Status: DC
Start: 2023-10-17 — End: 2023-10-18
  Administered 2023-10-17 – 2023-10-18 (×3): 0.1 mg via ORAL
  Filled 2023-10-17 (×3): qty 1

## 2023-10-17 MED ORDER — CINACALCET HCL 30 MG PO TABS *I*
30.0000 mg | ORAL_TABLET | Freq: Every day | ORAL | Status: DC
Start: 2023-10-18 — End: 2023-10-18
  Administered 2023-10-18: 30 mg via ORAL
  Filled 2023-10-17: qty 1

## 2023-10-17 MED ORDER — FLUDROCORTISONE ACETATE 100 MCG PO TABS *I*
100.0000 ug | ORAL_TABLET | Freq: Every day | ORAL | Status: DC
Start: 2023-10-17 — End: 2023-10-17

## 2023-10-17 MED ORDER — SODIUM CHLORIDE 0.9 % IV BOLUS *I*
250.0000 mL | Freq: Once | Status: AC
Start: 2023-10-17 — End: 2023-10-18
  Administered 2023-10-17: 250 mL via INTRAVENOUS

## 2023-10-17 MED ORDER — SODIUM CHLORIDE 0.9 % IV BOLUS *I*
1000.0000 mL | Freq: Once | Status: DC
Start: 2023-10-17 — End: 2023-10-17

## 2023-10-17 MED ORDER — FLUTICASONE PROPIONATE 50 MCG/ACT NA SUSP *I*
1.0000 | Freq: Every day | NASAL | Status: DC
Start: 2023-10-18 — End: 2023-10-18
  Filled 2023-10-17: qty 16

## 2023-10-17 MED ORDER — MIDODRINE HCL 5 MG PO TABS *I*
15.0000 mg | ORAL_TABLET | Freq: Three times a day (TID) | ORAL | Status: DC
Start: 2023-10-17 — End: 2023-10-17
  Administered 2023-10-17: 15 mg via ORAL
  Filled 2023-10-17: qty 3

## 2023-10-17 MED ORDER — ACETAMINOPHEN 500 MG PO TABS *I*
1000.0000 mg | ORAL_TABLET | Freq: Three times a day (TID) | ORAL | Status: DC | PRN
Start: 2023-10-17 — End: 2023-10-18

## 2023-10-17 MED ORDER — DEXTROSE 5 % FLUSH FOR PUMPS *I*
0.0000 mL/h | INTRAVENOUS | Status: DC | PRN
Start: 2023-10-17 — End: 2023-10-18

## 2023-10-17 MED ORDER — SODIUM CHLORIDE 0.9 % FLUSH FOR PUMPS *I*
0.0000 mL/h | INTRAVENOUS | Status: DC | PRN
Start: 2023-10-17 — End: 2023-10-17

## 2023-10-17 MED ORDER — SODIUM CHLORIDE 0.9 % IV BOLUS *I*
1000.0000 mL | Freq: Once | Status: AC
Start: 2023-10-17 — End: 2023-10-17
  Administered 2023-10-17: 1000 mL via INTRAVENOUS

## 2023-10-17 MED ORDER — CALCIUM CARBONATE-VITAMIN D 250-125 MG-UNIT PO TABS *WRAPPED*
2.0000 | ORAL_TABLET | Freq: Two times a day (BID) | ORAL | Status: DC
Start: 2023-10-18 — End: 2023-10-18
  Administered 2023-10-18: 2 via ORAL
  Filled 2023-10-17: qty 2

## 2023-10-17 NOTE — ED Provider Notes (Signed)
 History Chief Complaint Patient presents with  Weakness 63 year old male history of ESRD on HD TTS, hypotension on midodrine  Florinef , lumbosacral spondylosis, polycystic kidney disease, hyperlipidemia, coming in with a concern for weakness.  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  Extensive workup noted to have significant metastatic disease throughout the spine femurs psoas soft tissue.  Been working with specialist send seen by oncology today and encouraged to come to the ED giving progressive lower extremity weakness difficulty ambulating and pain control.History provided by:  Patient and medical recordsLanguage interpreter used: No  Medical/Surgical/Family History Past Medical History[1] Patient Active Problem List Diagnosis Code  ESRD (end stage renal disease) on dialysis N18.6, Z99.2  HLD (hyperlipidemia) E78.5  Hypotension I95.9  Ischemic colitis K55.9  Allergic rhinitis J30.9  Arteriosclerosis of artery of extremity I70.209  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 All other systems reviewed and are negative.Physical Exam Triage VitalsTriage Start: Start, (10/17/23 1545)  First Recorded BP: (!) 52/30, Resp: 16, Temp: 36.5 C (97.7 F), Temp src: Infrared Oxygen Therapy SpO2: 94 %, Oximetry Source: Lt Hand, O2 Device: None (Room air), Heart Rate: 98, (10/17/23 1543)  .First Pain Reported 0-10  Pain Scale: 0, (10/17/23 1545)  Physical ExamVitals and nursing note reviewed. Constitutional:     General: He is not in acute distress.   Appearance: Normal appearance. He is normal weight. He is not ill-appearing, toxic-appearing or diaphoretic.    Comments: Well-appearing lying comfortably on stretcher completing full sentences without difficulty HENT:    Head: Normocephalic and atraumatic. Eyes:    Conjunctiva/sclera: Conjunctivae normal. Cardiovascular:    Rate and Rhythm: Normal rate and regular rhythm.    Pulses: Normal pulses.    Heart sounds: No murmur heard.   Comments: Left chest HD catheter clean dry intactPulmonary:    Effort: Pulmonary effort is normal. No respiratory distress.    Breath sounds: Normal breath sounds. No wheezing. Abdominal:    General: There is no distension.    Palpations: Abdomen is soft.    Tenderness: There is no abdominal tenderness. Musculoskeletal:       General: No tenderness or deformity.    Right lower leg: Edema present.    Left lower leg: Edema present.    Comments: Active range of motion of lower extremities, plantar dorsiflexion hip flexion extension, knee flexion extension, equal bilaterally 5 out of 5 Skin:   General: Skin is warm and dry. Neurological:    General: No focal deficit present.    Mental Status: He is alert and oriented to person, place, and time. Mental status is at baseline. Psychiatric:       Mood and Affect: Mood normal.       Behavior: Behavior normal. Medical Decision Making Patient seen by me on:  8/7/2025Assessment:  63 year old male history of ESRD on HD TTS, hypotension on midodrine  Florinef , lumbosacral spondylosis, polycystic kidney disease, hyperlipidemia, coming in with a concern for weakness.  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  Extensive workup noted to have significant metastatic disease  throughout the spine femurs psoas soft tissue.  Been working with specialist send seen by oncology today and encouraged to come to the ED giving progressive lower extremity weakness difficulty ambulating and pain control.On arrival initial blood pressure 52/30 but warm well-perfused mentating appropriately immediate recheck was 89/58.  He has been dealing with some increasing leg weakness difficulty getting around with a walker.  Some longstanding left arm pain likely in the setting of metastatic cervical disease.  No falls or trauma been compliant with medications is due for second dose of midodrine  and Florinef  today he takes these 3 times a day which has been ongoing for many years he tells me.  His baseline blood pressures in the 90s systolic at best.  He did go to his scheduled HD session today for completion.Differential diagnosis:  Central cord syndrome, progressive metastatic disease of the spine, Fluid electro disturbance, infectious etiology, bacteremia,Plan:  Orders Placed This Encounter    Blood culture    Blood culture    MR lumbar spine without contrast    MR spine thoracic without contrast    MR cervical spine without contrast    CBC and differential    BMP (SST)    RUQ panel (ED only)    Lactate, venous, whole blood    Lactate, venous, whole blood (CONDITIONAL)    Continuous telemetry, non-protocol    EKG 12 lead (initial)    Insert peripheral IV    sodium chloride  0.9 % FLUSH REQUIRED IF PATIENT HAS IV    dextrose  5 % FLUSH REQUIRED IF PATIENT HAS IV    sodium chloride  0.9 % bolus 1,000 mL    midodrine  (PROAMATINE ) tablet 15 mg    fludrocortisone  (FLORINEF ) tablet 100 mcgEKG Interpretation:  I have independently reviewed the EKG in which I appreciated a regular rhythm 67, QTc 517, normal intervals, no ST segment changes, no T-wave changes. EKG has been reviewed by ED attending who concurs., tracing reviewed by myself, normal sinus rhythm, no  ischemic changesReview of existing & external labs / records: Reviewed recent hospitalization notes and oncology notes recommended MRI of the spine to rule out cord compressionED Course and Disposition:  Stable nonspecific leukocytosis may be some slight hemoconcentration today with elevated cell lines.  Renal function at about baseline did receive a full HD session earlier he tells me.  Lactate 3.9 suspect this is spurious as the patient looks well and is warm and well-perfused and has no other infectious symptoms.  Will redraw after IV fluids.  His blood pressures otherwise at his baseline which both by medical record and patient he reads usually systolics in the 80s to 90s.  He takes midodrine  and Florinef  3 times a day.  Blood cultures are pending.  Have spoken directly with MRI for expedited MRI of the spine to evaluate for cord compression.  Spoke with nephrology per radiology's guidance to arrange dialysis in the near 24 hours.  Patient and wife updated and agreeable to plan for admission resting comfortably.Labs Reviewed CBC AND DIFFERENTIAL - Abnormal; Notable for the following components:     Result Value  WBC 13.4 (*)   Hemoglobin 10.9 (*)   RDW 22.8 (*)   Platelets 551 (*)   Neut # K/uL 10.2 (*)   Mono # K/uL 1.5 (*)   Nucl RBC % 1.0 (*)   Nucl RBC # K/uL 0.1 (*)   IMM Granulocytes # 0.2 (*)   All other components within normal limits BASIC METABOLIC PANEL - Abnormal; Notable for  the following components:  Glucose 101 (*)   Chloride 90 (*)   CO2 29 (*)   Anion Gap 18 (*)   Creatinine 4.10 (*)   eGFR BY CREAT 15 (*)   All other components within normal limits RUQ PANEL (ED ONLY) - Abnormal; Notable for the following components:  Amylase 117 (*)   Alk Phos 235 (*)   All other components within normal limits LACTATE, VENOUS, WHOLE BLOOD - Abnormal; Notable for the following components:  Lactate VEN,WB 3.9 (*)   All other components within  normal limits BLOOD CULTURE  Narrative:   No growth to date BLOOD CULTURE  Narrative:   No growth to date RBC MORPHOLOGY LACTATE, VENOUS, WHOLE BLOOD MR lumbar spine without contrast    (Results Pending) MR spine thoracic without contrast    (Results Pending) MR cervical spine without contrast    (Results Pending) ED Course as of 10/17/23 1841 Thu Oct 17, 2023 1632 Spoke with MRI to expedite imaging 74 Spoke with Nephrology who will glady dialyze tomorrow as neededPatient resting comfortably Denese Mentink, PAAuthor:  DONNICE CATALAN, PA [1] Past Medical History:Diagnosis Date  Arthritis   CHF (congestive heart failure)   Colon polyp   Dialysis patient   ESRD (end stage renal disease)   GERD (gastroesophageal reflux disease)   Hypercholesterolemia   Hypotension   Ischemic colitis   Thyroid disease   hypo [2] Past Surgical History:Procedure Laterality Date  DIALYSIS FISTULA CREATION Bilateral   4 fistulas have failed- ? d/t low BP and small veins  HERNIA REPAIR    PR LIGATION/BIOPSY TEMPORAL ARTERY Right 10/04/2023  Procedure: TEMPORAL ARTERY BIOPSY RIGHT EYE;  Surgeon: Lynn Ervin Edelman, MD;  Location[facility] MAIN OR;  Service: Ophthalmology  Right inguinal hernia repair   [3] Social HistoryTobacco Use  Smoking status: Former   Packs/day: 0.30   Years: 15.00   Additional pack years: 0.00   Total pack years: 4.50   Types: Cigarettes   Quit date: 08/02/1992   Years since quitting: 31.2  Smokeless tobacco: Never Substance Use Topics  Alcohol use: No  Drug use: No  CATALAN DONNICE, PA08/07/25 1844

## 2023-10-17 NOTE — Progress Notes (Signed)
 Writer called report to [facility] ED. See Dr. Ginger visit note from today 8/7 for further details.

## 2023-10-17 NOTE — Telephone Encounter (Signed)
 Call Information  What is the primary reason you are calling today?: Other                                                                         OTHER REASON FOR CALL: Received Paid family leave forms just waiting on office notes

## 2023-10-17 NOTE — ED Notes (Signed)
 Report Given ToAdmit Descriptive Sentence / Reason for Admission Presenting Symptoms:Pt presents to the ED w/c/o weakness. PT went ot see his oncologist today, pt currently has renal cancer and states that over the past few weaks his legs have become a lot weaker. Pt now having a hard time moving around.  Admitting Diagnosis: Leg weakness Plan of Care: Admit Active Issues / Relevant Events  MRI pending Orientation Status: Alert and oriented x4 Current Ambulation Status: Pivot and wheelchair Skin Concerns/Wounds/Specialty Bed Needs: Reporting sore bottom Precautions:Current O2 Needs? What is their baseline Room air Mode of Transport to IP (pt is in stretcher/wheelchair/IP bed): Stretcher To Do ListOutstanding Tasks/Tests: MAR VS MRI Trend labs Anticipatory Guidance / Discharge PlanningPatient's living situation/needs for discharge: TBD

## 2023-10-17 NOTE — ED Triage Notes (Signed)
 Pt presents to the ED w/c/o weakness. PT went ot see his oncologist today, pt currently has renal cancer and states that over the past few weaks his legs have become a lot weaker. Pt now having a hard time moving around.   Prehospital medications given: No

## 2023-10-17 NOTE — Plan of Care (Signed)
 Nephrology brief plan of care note on callCalled by ER regarding Shane Pearson who is a 63 year old male with ESRD on dialysis Tuesday Thursday Saturday.  He was last dialyzed today.  They are worried about cord compression and have requested MRI with gadolinium.  They were concerned that radiology would not do this without guarantee of dialysis tomorrow.Nephrology opinion on this is as follows:Vueway  which is the contrast that will be used is gadopiclenol  which is a newer generation gadolinium based contrast agent. Per the joint guidelines that were put out by the AIR and the ASN which is the radiology and the nephrology Association's nationally there is not the need to do urgent dialysis within 24 hours of administering MRI contrast with a gadolinium agent that is second-generation or later which this is. The reason being that second-generation and later gadolinium contrast have much less risk of NSF than the first generation gadolinium agents had.  It is therefore reasonable to wait until his next dialysis would be normally due on Saturday morning.  I tried calling Dr. Raleigh the on-call nephrologist and [name] the radiology technician and was unsuccessful in reaching both.  I provided my number by secure chat and did not hear from either of them.  Nephrology does not feel that should in any way delay MRI contrast to look for cord compression if ER is worried about cord compression.We have put him on the board for dialysis for tomorrow afternoon which should still be within the 24 hours that they state they needed if he gets the gadolinium contrast this evening.I will hand this over to my nephrology colleague to further look into tomorrow morning.Yours Orvan Mauricia Hollow MD, MSc, FRCPCDepartment of Medicine, Division of NephrologyOffice Phone Number: [phone] Fax Number: [phone]

## 2023-10-17 NOTE — Progress Notes (Signed)
 Submitted order for CARIS testing on bone L5 pathology from 10/03/23

## 2023-10-17 NOTE — H&P (Addendum)
 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 ED today from oncology office with progressively worsening, proximal, BL LE weakness c/b ambulatory dysfunction (concerning for spinal chord compression) and progressively worsening LUE pain/weakness. Patient is now being admitted to the medicine service for further management. Patient relays intermittent LUE pain for months with associated worsening weakness. He denies currently being in pain. He can barely lift his left arm off the bed. Patient relays progressively worsening BL LE weakness for weeks such that he is having much difficulty ambulating with his rolling walker. He reports an adequate appetite for the past 2 days but endorses anorexia prior to this. Given this, he would like to trial off Remeron  for right now. He endorses DOE for months.He denies fevers, chills, rigors, CP, palpitations, dyspnea, orthopnea, nocturnal dyspnea, NVD, abdominal pain, constipation, dysuria, hematuria, anuria, burning, numbness, tingling, loss of bowel/bladder function, lightheadedness, vertigo, and syncope.Past Medical History: Diagnosis Date  Arthritis   CHF (congestive heart failure)   Colon polyp   Dialysis patient   ESRD (end stage renal disease)   GERD (gastroesophageal reflux disease)   Hypercholesterolemia   Hypotension   Ischemic colitis   Thyroid disease   hypo Past Surgical History: Procedure  Laterality Date  DIALYSIS FISTULA CREATION Bilateral   4 fistulas have failed- ? d/t low BP and small veins  HERNIA REPAIR    PR LIGATION/BIOPSY TEMPORAL ARTERY Right 10/04/2023  Procedure: TEMPORAL ARTERY BIOPSY RIGHT EYE;  Surgeon: Lynn Ervin Edelman, MD;  Location: [facility] MAIN OR;  Service: Ophthalmology  Right inguinal hernia repair   Family History Problem Relation Name Age of Onset  Diabetes Mother    Asthma Mother    Coronary artery disease Father   Social History Socioeconomic History  Marital status: Married Tobacco Use  Smoking status: Former   Packs/day: 0.30   Years: 15.00   Additional pack years: 0.00   Total pack years: 4.50   Types: Cigarettes   Quit date: 08/02/1992   Years since quitting: 31.2  Smokeless tobacco: Never Substance and Sexual Activity  Alcohol use: No  Drug use: No Allergies: Allergies Allergen Reactions  Penicillins Swelling   Face  Coconut Flavoring Agent (Non-Screening) Nausea And Vomiting   All sources coconut (including raw) --causes regurgitation   Amoxicillin Rash  Baclofen Other (See Comments)   confusion  No Known Latex Allergy  Medications Prior to Admission Medication Sig  walker Exact equipment needed: Rolling walker Specific length of time needed: LifetimeICD10 Code: R26.2 Ht: 5' 5   Wt: 175 lbs  midodrine  (PROAMATINE ) 5 mg tablet Take 3 tablets (15 mg total) by mouth 3 times daily.  magnesium  oxide (MAGNESIUM  OXIDE -MG SUPPLEMENT) 400 (241.3 mg) mg tablet Take 1 tablet (400 mg total) by mouth daily.  aspirin  81 MG EC tablet Take 1 tablet (81 mg total) by mouth daily.  fluticasone  (FLONASE ) 50 MCG/ACT nasal spray Spray 1 spray into nostril daily.  atorvastatin  (LIPITOR) 10 MG tablet Take 2 tablets (20 mg total) by mouth daily (with dinner).  fludrocortisone  (FLORINEF ) 0.1 mg tablet Take 1 tablet (0.1 mg  total) by mouth 3 times daily.  b complex-vitamin  c-folic acid  (NEPHRO-VITE) tablet Take 1 tablet by mouth daily.  calcium -vitamin D  (OSCAL-500) 500-200 MG-UNIT per tablet Take 1 tablet by mouth 2 times daily.  cinacalcet  (SENSIPAR ) 30 MG tablet Take 1 tablet (30 mg total) by mouth daily.  omeprazole  (PRILOSEC) 20 MG capsule Take 2 capsules (40 mg total) by mouth daily (before breakfast).  mirtazapine  (REMERON ) 7.5 mg tablet Take 1 tablet (7.5 mg total) by mouth nightly for poor appetite/insomnia.  acetaminophen  (TYLENOL ) 325 MG tablet Take 2 tablets (650 mg total) by mouth every 4-6 hours as needed for Pain.  Current Facility-Administered Medications Medication Dose Route Frequency  sodium chloride  0.9 % FLUSH REQUIRED IF PATIENT HAS IV  0-500 mL/hr Intravenous PRN  acetaminophen  (TYLENOL ) tablet 1,000 mg  1,000 mg Oral Q8H PRN  [START ON 10/18/2023] aspirin  EC tablet 81 mg  81 mg Oral Daily  [START ON 10/18/2023] atorvastatin  (LIPITOR) tablet 20 mg  20 mg Oral Daily with dinner  [START ON 10/18/2023] B complex-vitamin C-folic acid  (NEPHRO-VITE) tablet 1 tablet  1 tablet Oral Daily  [START ON 10/18/2023] calcium -vitamin D  (OSCAL) 250-125 MG-UNIT per tablet 2 tablet  2 tablet Oral BID  [START ON 10/18/2023] cinacalcet  (SENSIPAR ) tablet 30 mg  30 mg Oral Daily  fludrocortisone  (FLORINEF ) tablet 0.1 mg  0.1 mg Oral TID  [START ON 10/18/2023] fluticasone  (FLONASE ) 50 MCG/ACT nasal spray 1 spray  1 spray Nasal Daily  [START ON 10/18/2023] magnesium  oxide (MAG-OX) tablet 400 mg  400 mg Oral Daily  midodrine  (PROAMATINE ) tablet 15 mg  15 mg Oral TID  [START ON 10/18/2023] pantoprazole  (PROTONIX ) EC tablet 40 mg  40 mg Oral QAM  dextrose  5 % FLUSH REQUIRED IF PATIENT HAS IV  0-500 mL/hr Intravenous PRN  enoxaparin  (LOVENOX ) injection 30 mg  30 mg Subcutaneous Daily @ 2100  polyethylene glycol (GLYCOLAX ,MIRALAX ) powder 17 g  17 g Oral Daily PRN  Gadopiclenol  (VUEWAY ) injection 9 mL  9 mL Intravenous Once  sodium chloride  0.9 % bolus 250 mL   250 mL Intravenous Once Review of Systems: Review of Systems All other systems reviewed and are negative.Last Nursing documented pain: 0-10  Pain Scale: 0 (10/17/23 1900)Patient's stated pain goal on admission:  Patient Vitals for the past 24 hrs: BP Temp Temp src Pulse Resp SpO2 Height Weight 10/17/23 2214 -- -- -- -- -- -- -- 87.1 kg (192 lb) 10/17/23 2213 -- -- -- -- -- -- -- 87.1 kg (192 lb) 10/17/23 1939 (!) 88/54 -- -- -- -- -- -- -- 10/17/23 1900 (!) 79/49 36.7 C (98.1 F) -- 89 -- 95 % -- -- 10/17/23 1827 (!) 83/52 36.7 C (98.1 F) -- 88 22 94 % -- -- 10/17/23 1700 (!) 86/55 -- -- -- 20 96 % -- -- 10/17/23 1543 (!) 52/30 36.5 C (97.7 F) Infrared 98 16 94 % 1.651 m (5' 5) 87.3 kg (192 lb 7.4 oz)  Physical ExamGen: Well appearing, non toxic, comfortable, adult AAM supine in bed INAD.HEENT: NCAT. Vision and hearing grossly intact. Nares patent. Lips pink.Chest: Central tunneled venous catheter to the right upper chest wall with CDI dressing Cardio: S1S2 no MRG. Radial pulse 2+ BL and is RRR.Resp: Unlabored WOB w/o accessory muscle usage. Right basilar rales - remainder of auscultation is clear without wheeze or rhonchi.Abdomen: Flat and not distended. Normoactive BS x 4. Soft and NTTP x 4.Extremities: No pretibial pitting edema. Calves BL supple and NT.Neuro: CN II-XII grossly intact. Alert and oriented  x 4. No focal neuro deficits. Muscle strength RUE 5/5. Muscle strength LUE 2/5. Muscle strength to the BL LE is 3/5. Plantar flexion and dorsiflexion intact. Lab Results: All labs in the last 24 hours: Recent Results (from the past 24 hours) CBC and differential  Collection Time: 10/17/23  4:44 PM Result Value Ref Range  WBC 13.4 (H) 3.5 - 11.0 THOU/uL  RBC 4.3 4.0 - 6.0 MIL/uL  Hemoglobin 10.9 (L) 12.0 - 17.0 g/dL  Hematocrit 37 37 - 52 %  MCV 87 75 - 100 fL  RDW 22.8 (H) 0.0 - 15.0 %  Platelets 551 (H) 150 - 450 THOU/uL   Seg Neut % 75.8 %  Neut # K/uL 10.2 (H) 1.5 - 6.5 THOU/uL  Lymph # K/uL 1.3 1.0 - 5.0 THOU/uL  Mono # K/uL 1.5 (H) 0.1 - 1.0 THOU/uL  Eos # K/uL 0.2 0.0 - 0.5 THOU/uL  Baso # K/uL 0.1 0.0 - 0.2 THOU/uL  Nucl RBC % 1.0 (H) 0.0 - 0.2 /100 WBC  Nucl RBC # K/uL 0.1 (H) THOU/uL  IMM Granulocytes # 0.2 (H) THOU/uL  IMM Granulocytes 1.3 % BMP (SST)  Collection Time: 10/17/23  4:44 PM Result Value Ref Range  Glucose 101 (H) 60 - 99 mg/dL  Sodium 862 866 - 854 mmol/L  Potassium 4.7 3.3 - 5.1 mmol/L  Chloride 90 (L) 96 - 108 mmol/L  CO2 29 (H) 20 - 28 mmol/L  Anion Gap 18 (H) 7 - 16  UN 20 6 - 20 mg/dL  Creatinine 5.89 (H) 9.32 - 1.17 mg/dL  eGFR BY CREAT 15 (!)   Calcium  9.2 8.6 - 10.2 mg/dL RUQ panel (ED only)  Collection Time: 10/17/23  4:44 PM Result Value Ref Range  Amylase 117 (H) 28 - 100 U/L  Lipase 47 13 - 60 U/L  Total Protein 7.1 6.3 - 7.7 g/dL  Albumin 3.6 3.5 - 5.2 g/dL  Bilirubin,Total 0.3 0.0 - 1.2 mg/dL  Bili,Indirect See below 0.1 - 1.0 mg/dL  Bilirubin,Direct <9.7 0.0 - 0.3 mg/dL  Alk Phos 764 (H) 40 - 130 U/L  AST CANCELED U/L  ALT 10 0 - 50 U/L Blood culture  Collection Time: 10/17/23  4:44 PM  Specimen: Blood: aerobic/anaerobic; R ANTECUBITAL Result Value Ref Range  Bacterial Blood Culture .  Blood culture  Collection Time: 10/17/23  4:44 PM  Specimen: Blood: aerobic/anaerobic; R HAND Result Value Ref Range  Bacterial Blood Culture .  Lactate, venous, whole blood  Collection Time: 10/17/23  4:44 PM Result Value Ref Range  Lactate VEN,WB 3.9 (HH) 0.5 - 2.2 mmol/L RBC morphology  Collection Time: 10/17/23  4:44 PM Result Value Ref Range  RBC Morphology RESULTS   Anisocytosis Present   Hypochromia Present   Polychromasia Present   Poikilocytes Present   Tear Drop Cells Present  Lactate, venous, whole blood (CONDITIONAL)  Collection Time: 10/17/23  7:57 PM Result Value Ref Range   Lactate VEN,WB 3.4 (H) 0.5 - 2.2 mmol/L Radiology Impressions (last 3 days):No results found.Currently Active/Followed [facility] Problems  Diagnosis   Leg weakness  Assessment: This is a 62 yo male w/ a PMHx significant for AFTT, PCKD, ESRD on HD TTS, CHF, HLD, hypotension (midodrine  and Florinef ), GERD, ischemic colitis, DDD, recent [facility] admission from 7/19-7/31 for 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 Also complains of DOE for months. Patient is now being admitted to the medicine service for further management. Patient is hypotensive at baseline and remainder of vitals are stable. Lactate is interestingly elevated but is trending down s/p IVF and there are no obvious s/sx concerning for infection. Liver profile unchanged from prior.Plan: #BL LE weakness c/b ambulatory dysfunctionIn the setting of spinal mets. There is concern for spinal chord compression-MRI cervical, thoracic, and lumbar spine-Fall precautions-Bed alarm-Day team to consider neuro consult pending MRI results#Chronic LUE pain w/ profound weaknessMost likely in the setting of C7 lesion causing nerve root compression. Patient declined palliative radiation. -PT/OT-APAP PRN pain#Stage IV metastatic carcinomaMost likely of renal origin-Follow CARIS testing on bone L5 pathology from 10/03/23 to determine the primary location of the poorly differentiated carcinoma-Follow with [facility] - patient agreed to [facility] for intracranial mets#Elevated lactateTrending down. Etiology unclear. Low suspicion for infection.-Start 250 cc bolus over 1 hour and repeat lactate  at 0000 to ensure down trending-Follow blood cultures-CXR given right basilar rales on exam#DOE x monthsMost likely in the setting of CHF (unspecified and no echo for reference) and ESRD. No acute anemia. Low suspicion for PE. No history of obstructive or restricting lung disease-Ambulatory pulse ox-Defer NT pro BNP for now given low suspicion for acute exacerbation-O2 PRN#Qtc prolongation-Avoid QTC prolonging agents-Tele#Chronic hypotensionBP soft at baseline.-Cont midodrine -Cont Florinef -Vitals q4H-Notify provider for manual SBP >80#AFTT#AnorexiaReports good appetite currently and would like to be without Remeron  for now.#HLD-Lipitor-ASA 81#Chronic leukocytosisIso metastatic cancer. At baseline. Low suspicion for acute infection.-Trend CBC/D#Chronic thrombocytopeniaIso metastatic cancer. At baseline.-Trend PLT#Chronic normocytic anemia at baselineStable and at baseline.-Iron studies, B12, FA#PCKD#ESRD on HD TTSCentral tunneled venous catheter is CDI. Renal function around baseline.-Nephrology order for HD placed-Trend BMP, mag, phos-Nephrovite-Calcium -Vit D-Mag ox#Hyperparathyroidism-iPTH-Cinacalcet #GERD-Protonix  for home PrilosecMisc:-Diet: Renal-Bowel regimen: Miralax  PRN-DVT prophylaxis: Lovenox -Code status: FULL CODEI personally spent 90 total minutes in this admission, including pre and post work.  Author: Patti Ee, PA  Note created: 10/17/2023  at: 10:37 PM

## 2023-10-18 ENCOUNTER — Other Ambulatory Visit: Payer: Self-pay

## 2023-10-18 ENCOUNTER — Observation Stay

## 2023-10-18 DIAGNOSIS — N2581 Secondary hyperparathyroidism of renal origin: Secondary | ICD-10-CM

## 2023-10-18 DIAGNOSIS — I953 Hypotension of hemodialysis: Secondary | ICD-10-CM

## 2023-10-18 DIAGNOSIS — C801 Malignant (primary) neoplasm, unspecified: Secondary | ICD-10-CM

## 2023-10-18 DIAGNOSIS — C7951 Secondary malignant neoplasm of bone: Secondary | ICD-10-CM

## 2023-10-18 DIAGNOSIS — D631 Anemia in chronic kidney disease: Secondary | ICD-10-CM

## 2023-10-18 DIAGNOSIS — I120 Hypertensive chronic kidney disease with stage 5 chronic kidney disease or end stage renal disease: Secondary | ICD-10-CM

## 2023-10-18 LAB — EKG 12-LEAD
P: 72 deg
PR: 155 ms
QRS: 75 deg
QRSD: 88 ms
QT: 415 ms
QTc: 517 ms
Rate: 93 {beats}/min
T: 108 deg

## 2023-10-18 LAB — CBC AND DIFFERENTIAL
Baso # K/uL: 0.1 THOU/uL (ref 0.0–0.2)
Eos # K/uL: 0.2 THOU/uL (ref 0.0–0.5)
Hematocrit: 32 % — ABNORMAL LOW (ref 37–52)
Hemoglobin: 9.3 g/dL — ABNORMAL LOW (ref 12.0–17.0)
IMM Granulocytes #: 0.2 THOU/uL — ABNORMAL HIGH
IMM Granulocytes: 1.9 %
Lymph # K/uL: 1.2 THOU/uL (ref 1.0–5.0)
MCV: 87 fL (ref 75–100)
Mono # K/uL: 1.2 THOU/uL — ABNORMAL HIGH (ref 0.1–1.0)
Neut # K/uL: 8.7 THOU/uL — ABNORMAL HIGH (ref 1.5–6.5)
Nucl RBC # K/uL: 0.1 THOU/uL — ABNORMAL HIGH
Nucl RBC %: 0 /100{WBCs} (ref 0.0–0.2)
Platelets: 478 THOU/uL — ABNORMAL HIGH (ref 150–450)
RBC: 3.7 MIL/uL — ABNORMAL LOW (ref 4.0–6.0)
RDW: 22.4 % — ABNORMAL HIGH (ref 0.0–15.0)
Seg Neut %: 75.4 %
WBC: 11.5 THOU/uL — ABNORMAL HIGH (ref 3.5–11.0)

## 2023-10-18 LAB — BASIC METABOLIC PANEL
Anion Gap: 15 (ref 7–16)
CO2: 28 mmol/L (ref 20–28)
Calcium: 8.6 mg/dL (ref 8.6–10.2)
Chloride: 93 mmol/L — ABNORMAL LOW (ref 96–108)
Creatinine: 4.87 mg/dL — ABNORMAL HIGH (ref 0.67–1.17)
Glucose: 95 mg/dL (ref 60–99)
Lab: 25 mg/dL — ABNORMAL HIGH (ref 6–20)
Potassium: 4.2 mmol/L (ref 3.3–5.1)
Sodium: 136 mmol/L (ref 133–145)
eGFR BY CREAT: 13 — AB

## 2023-10-18 LAB — TIBC
Iron: 65 ug/dL (ref 45–170)
TIBC: 176 ug/dL — ABNORMAL LOW (ref 250–450)
Transferrin Saturation: 37 % (ref 20–55)

## 2023-10-18 LAB — MAGNESIUM: Magnesium: 1.7 mg/dL (ref 1.6–2.5)

## 2023-10-18 LAB — PHOSPHORUS: Phosphorus: 2.8 mg/dL (ref 2.7–4.5)

## 2023-10-18 LAB — TRANSFERRIN: Transferrin: 130 mg/dL — ABNORMAL LOW (ref 200–360)

## 2023-10-18 LAB — LACTATE, VENOUS, WHOLE BLOOD: Lactate VEN,WB: 1.8 mmol/L (ref 0.5–2.2)

## 2023-10-18 MED ORDER — HEPARIN SODIUM (PORCINE) 1000 UNIT/ML IJ SOLN *WRAPPED*
0.0000 mL | Status: DC | PRN
Start: 2023-10-18 — End: 2023-10-18
  Administered 2023-10-18: 1.6 mL

## 2023-10-18 MED ORDER — DEXAMETHASONE 4 MG PO TABS *I*
4.0000 mg | ORAL_TABLET | Freq: Two times a day (BID) | ORAL | Status: DC
Start: 2023-10-18 — End: 2023-10-18

## 2023-10-18 MED ORDER — HEPARIN SODIUM (PORCINE) 1000 UNIT/ML IJ SOLN *WRAPPED*
0.0000 mL | Status: DC | PRN
Start: 2023-10-18 — End: 2023-10-18
  Administered 2023-10-18: 1.6 mL
  Filled 2023-10-18: qty 10

## 2023-10-18 MED ORDER — DEXAMETHASONE 4 MG PO TABS *I*
4.0000 mg | ORAL_TABLET | Freq: Two times a day (BID) | ORAL | 0 refills | Status: AC
Start: 2023-10-18 — End: 2023-11-01
  Filled 2023-10-18: qty 28, 14d supply, fill #0

## 2023-10-18 MED ORDER — PANTOPRAZOLE SODIUM 40 MG PO TBEC *I*
40.0000 mg | DELAYED_RELEASE_TABLET | Freq: Every morning | ORAL | 0 refills | Status: AC
Start: 2023-10-19 — End: 2023-11-18
  Filled 2023-10-18: qty 30, 30d supply, fill #0

## 2023-10-18 MED ORDER — SODIUM CHLORIDE 0.9 % IV BOLUS *I*
250.0000 mL | Freq: Once | Status: AC
Start: 2023-10-18 — End: 2023-10-18
  Administered 2023-10-18: 250 mL via INTRAVENOUS

## 2023-10-18 NOTE — Progress Notes (Signed)
 Penicillin Allergy HistoryPerson providing history: Patient (Self)What drug(s) are you allergic to? Penicillin and AmoxicillinWhat symptom(s) did you experience? Skin rash and Facial swellingWhen did the most recent reaction occur? Greater than 5 yearsHow long after taking the drug did the symptom(s) occur? Unknown Did you experience any blistering or ulcers on the skin or in the mouth? NoDid you receive treatment for the reaction? NoWhat treatment did you receive? UnknownSince the initial reaction, have you seen an Allergy provider and/or received any testing (e.g. skin test, direct challenge) for this medication? NoIn the past 5 days, have you received any of the following drugs: cetirizine (Zyrtec), chlorpheniramine, desloratadine (Clarinex), levocetirizine (Xyzal), loratadine (Claritin), fexofenadine (Allegra), hydroxyzine (Atarax), diphenhydramine  (Benadryl ), promethazine (Phenergan)? NoIf yes, when was your last dose? N/a Patients born as male sex and age less than 75: Are you pregnant or is there a chance you could be pregnant?  N/aWho is your primary care doctor? Dr. Debby Noss pharmacies do you use? (name and location) [facility] [place] Comments: Patient states he developed a rash and minimal facial swelling after taking Penicillin/Amoxicillin. PCP records indicate a reaction of Anaphylaxis to Amoxicillin. Patient states he does NOT recall a reaction of  Anaphylaxis/difficulty breathing/throat swelling.This Penicillin Allergy History was performed by Marissa Kalen, CPhT, with [facility] PharmacyLeigh Jenkins Bathe PharmD, BCPSTransitional Care PharmacistHighland [address], [place], [place] [address]Eynwz: [phone]: [phone] ([phone])Fax: [phone]

## 2023-10-18 NOTE — Progress Notes (Signed)
 Interdisciplinary Rounding Note Osiris Odriscoll was discussed today during health team rounds. Problems addressed:Active Problems:  Leg weaknessThe plan of care was reviewed with the following health team members:Charge NurseSocial WorkerAttending PhysicianPhysician AssistantCare CoordinatorPhysical Gwynne Gunner, RN 10/18/2023 9:29 AM

## 2023-10-18 NOTE — Discharge Instructions (Addendum)
 Brief Summary of Your Hospital Course (including key procedures and diagnostic test results):You came to the hospital with weakness, most notably in your left arm. You had MRIs of you spine which were similar to prior. You will follow up with radiation oncology outpatient to receive radiation therapy to your left arm. While you were here, you worked with PT and OT who recommended home PT/OT. You were medically stable and cleared for discharge home today. Your instructions:- Take all medications as described below, see attached list - Follow up with oncology - Follow up with radiation-oncology: Appointment scheduled for Monday 8/11 at 1:30 pm at [facility]. Medication changes: - Decadron  4mg  twice daily (take one in the morning and one in the afternoon ~2-3 pm) - Protonix  40 mg daily while on steroids What to do after you leave the hospital:Recommended diet: Low Potassium Recommended activity: activity as toleratedIf you experience any of these symptoms within the first 24 hours after discharge:Uncontrolled pain, Chest pain, Shortness of breath, Fever of 101 F. or greater, or Chillsplease follow up with the on call hospitalist at phone-number: [phone] (Monday-Friday 8-4) or [phone] (after hours and weekends) We reviewed your medication allergy history. Because of your previous reaction to penicillin/amoxicillin, we think that you would be an appropriate candidate to undergo testing to see if you are truly allergic. We were unable to do this while you were admitted.  We ordered a referral to Allergy and Immunology [phone]). They will call you to schedule an appointment, which may occur within the next 12 months as part of your preventative healthcare.

## 2023-10-18 NOTE — Progress Notes (Signed)
 Physical TherapyInitial Eval Completed.  Patient is a 63 y.o.male who presents with worsening of (B) LE proximal strength, worsening LUE weakness and pain, increased difficulty with stairs. 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 Pt demonstrates ability to mobilize household distances and reports spouse will be able to assist him as needed. PT not a barrier to discharge but will continue to follow to address deficits in strength and endurance. Is another physical therapy session necessary prior to hospital discharge:  NoDischarge recommendation:  24 Hour supervision/assist, Anticipate return to prior living arrangement, Home PTEquipment recommendations upon discharge: None  Mobility recommendations for nursing while in hospital:  Patient Active Problem List Diagnosis Code  ESRD (end stage renal disease) on dialysis N18.6, Z99.2  HLD (hyperlipidemia) E78.5  Hypotension I95.9  Ischemic colitis K55.9  Allergic rhinitis J30.9  Arteriosclerosis of artery of extremity I70.209  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  Embolism due to vascular prosthetic devices, implants and grafts, initial encounter T82.818A  Spondylosis of lumbosacral region without myelopathy or radiculopathy M47.817  Diarrhea R19.7  Failure to thrive in adult R62.7  History of temporal artery biopsy Z98.890  Leg weakness R29.898 Past Medical History: Diagnosis Date  Arthritis   CHF (congestive heart failure)   Colon polyp   Dialysis patient   ESRD (end stage renal disease)   GERD (gastroesophageal reflux disease)   Hypercholesterolemia   Hypotension   Ischemic colitis   Thyroid  disease   hypo Past Surgical History: Procedure Laterality Date  DIALYSIS FISTULA CREATION Bilateral   4 fistulas have failed- ? d/t low BP and small veins  HERNIA REPAIR    PR LIGATION/BIOPSY TEMPORAL ARTERY Right 10/04/2023   Procedure: TEMPORAL ARTERY BIOPSY RIGHT EYE;  Surgeon: Lynn Ervin Edelman, MD;  Location: [facility] MAIN OR;  Service: Ophthalmology  Right inguinal hernia repair   *Bold Indicates co-morbidities affecting treatment and recoveryComorbidities affecting treatment/recovery in addition to those listed above:Renal CA Personal factors affecting treatment/recovery:Advanced ageRecent hospital admissionClinical presentation:stable Patient complexity:  low level as indicated by above personal factors, environmental factors and comorbidities in addition to their impairments found on physical exam. PT Adult Assessment - 10/18/23 0920    Prior Living   Prior Living Situation Reported by patient   Lives With Family   spouse and mother  Type of Home [facility]   # Steps to Enter Home 2   # Rails to [employer] [facility] Home 1   # Of Steps In Home 12   # Rails in Home 2   Location of Bedrooms 1st floor   Location of Bathrooms 1st floor   Bathroom Accessibility Accessible;Walk-in shower   Current Home Equipment Adjustable bed;Grab bars in shower/tub;Shower chair;Crutches;Walker (rolling)    Prior Function Level  Prior Function Level Reported by patient   Transport planner None   Walking Independent   Walking assistive devices used None   Stair negotiation Independent   History of Falls No   Receives Help From Family   Type of Help Received PTA Pt reports his spouse has been assisting him with ADLs and iADLs.  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    PT Tracking  PT TRACKING PT Assigned   Type of Session evaluation   SW Request? No    Treatment Day  Treatment Day (HH / URR) 1    Precautions/Observations  Precautions used Yes   LDA Observation  Monitors   Was patient wearing a mask? No   PPE worn by writer [employer]   Fall Precautions Patient educated  to use call light for nursing assist prior to getting up;General falls precautions   Other HR not reading at this time due to leads being detached    Current Pain Assessment  0-10  Pain Scale 0   Pain Comments Pt reports discomfort in LUE with activity but none at rest    Vision   Current Vision Adequate for PT session    Cognition  Cognition Tested   Arousal/Alertness Appropriate responses to stimuli   Memory Appears intact   Orientation Alert and oriented x4   Ability to Follow Instructions Follows all commands and directions without difficulty   Safety Judgement Intact    UE Assessment  Assessment Focus Strength    LUE Strength  Overall Strength Deficits   pt's elbow, wrist and hand ROM WFL, no active shoulder movement but WFL passively   RUE Strength  Overall Strength WFL assessed within functional activities    LE Assessment  Assessment Focus Strength    LLE Strength  Hip Flexion 3+/5   Hip Abduction 3+/5   Hip Adduction 3+/5   Knee Flexion 3+/5   Knee Extension 4-/5   Ankle Dorsiflexion 4/5   Ankle Plantar Flexion 4/5    RLE Strength  Hip Flexion 3+/5   Hip ABduction 3+/5   Hip ADduction 3+/5   Knee Flexion 3+/5   Knee Extension 4-/5   Ankle Dorsiflexion 4/5   Ankle Plantar Flexion 4/5    Sensation  Sensation No apparent deficit   Additional Comments Pt denies paresthesias    Coordination  Coordination Toe Tapping   Toe Tapping Within Normal Limits    Bed Mobility  Bed mobility Tested   Supine to Sit Modified independent (device);Head of bed elevated   Sit to Supine Modified independent (device);Head of bed elevated    Transfers  Transfers Tested   Sit to Stand Stand by assistance   Stand to sit Stand by assistance   Transfer Assistive Device rolling walker   Additional comments appropriate safety awareness during transfers without cues, x 1 from recliner and x 2 from EOB, x1 from  toilet    Mobility  Mobility Tested   Gait Pattern Decreased cadence;Decreased R step length;Decreased R step height;Decreased L step length;Decreased L step height   Ambulation Assist Stand by;1 person assist   Ambulation Distance (Feet) 20 + 30   Ambulation Assistive Device rolling walker   Platform Step Contact guard;1person assist    Family/Caregiver Training`  Patient/Family/Caregiver training Yes   Patient training Role of physical therapy in hospital and plan for evaluation and follow up;Discharge planning;PT plan of care after evaluation;Therapeutic exercises, including recommendation of frequency of therapeutic exercises to be completed by patient throughout day;Benefits of oob activity and mobility during hospitalization, including frequency of ambulation and change of position;Recommendation to increase oob activity and ambulation with nursing while in hospital;Call don't fall, purpose of bed/chair alarm, and recommendation for nursing assistance during all oob mobility    Balance  Balance Tested   Sitting - Static Independent   Sitting - Dynamic Independent   Standing - Static Standby assist;Supported   Standing - Dynamic Standby assist;Supported    Functional Outcome Measures  Functional Outcome Measures Yes    PT AM-PAC Mobility  Turning over in bed? None: Modified Independence/independent   Moving from lying on back to sitting on the side of the bed?  None: Modified Independence/independent   Moving to and from a bed to a chair? None: Modified Independence/independent   Sitting down on and standing up from a chair with arms? None: Modified Independence/independent   Need to walk in hospital room? A Little: Minimum/Contact Guard Assist/Supervision   Climbing 3 - 5 steps with a railing? A Little: Minimum/Contact Guard Assist/Supervision   Total Raw Score 22   Standardized Score - Calculated 53.28   % Functional Impairment - Calculated 21%     Assessment  Brief Assessment Appropriate for skilled therapy;Patient demonstrates adequate mobility skills to return home   Problem List Impaired endurance;Impaired stair navigation;Impaired transfers;Impaired ambulation;Impaired functional status;Impaired LE strength;Impaired UE strength   Patient / Family Goal Home when med ready   Overall Assessment Shane Pearson presents with worsening of (B) LE proximal strength, worsening LUE weakness and pain, increased difficulty with stairs. 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  Pt demonstrates ability to mobilize household distances and reports spouse will be able to assist him as needed.   PT not a barrier to discharge but will continue to follow to address deficits in strength and endurance.    Plan/Recommendation  PT Treatment Interventions Restorative PT   PT Frequency 2-3x/wk   PT Referral Recommendations OT;SW;Home care   PT Discharge Recommendations 24 Hour supervision/assist;Anticipate return to prior living arrangement;Home PT   PT Discharge Equipment Recommended None   PT Assessment/Recommendations Reviewed With: Patient;Occupational Therapy;Social Energy manager   Next PT Visit higher level balance; review stairs and amb   PT needs to see patient prior to DC  No    Time Calculation  PT Timed Codes 0   PT Untimed Codes 22   PT Total Treatment 22    Plan and Onset date  Plan of Care Date 10/18/23   Onset Date 10/17/23   Treatment Start Date 10/18/23    AMPAC Score Interpretation:Adapted from [place] L. Young et al [employer] learning prediction of hospital patient need for post-acute care using admission mobility measure is robust across patient diagnoses. Health Policy and Technology, volume 12, issue 2, June 2023, 100754 Patients 66 years or older with an AM-PAC standardized score of <31 have an increased likelihood of requiring post-acute care  (76%)Patients that are less than 13 years old with an AM-PAC standardized score of <31 (41-59%) or, patients that are>47 years old with an AM-PAC standardized score >/=31 (37-50%) have a moderate likelihood of requiring post-acute care Patients with an AM-PAC standardized score of >43 have an increased likelihood for discharge to home regardless of age. (5-12%)Shane Pearson, PTPlease secure chat to communicate if needed.

## 2023-10-18 NOTE — Continuity of Care (Addendum)
 HOME CARE AGENCY CHOICEMedicare regulations require that hospitals advise patients that they may choose to receive services from any home health agency.Patient/family choice of agency is noted with an X by that agency's name.Agencies: [x]   [facility] [place], [place], [place], [place], [place], [place]  and [place] counties)    Referred at pt/family request 10/18/2023 11:44 AM    Accepted for start of care date 10/23/23.   10/18/2023 12:11 PM    []   [facility] [place]   [x]   [facility]   Secure chat request 11:33 AM   Cancelled by SW 10/18/2023 11:43 AM   []   [facility] [place]    []   Other ____________________Home Care Agency Representative alerted:Name: secure chat inquire if active prior to arrivalDate: 8/8/25Time: 10:10 AM*[facility] along with [facility], [facility] and [facility] are all part of the [place] [facility] [place]'s health care system.

## 2023-10-18 NOTE — Progress Notes (Signed)
 Home Health Assessment    Completed by: Leanne Lovely LPN  Phone: [phone]    Referred by: Rockne Menghini, LMSW       Source of Information: chart review      Home Health indicators present: will need follow up at home      Barriers to discharge to be addressed: will be assessed      Question Patient? No      Plan: Home care referral started; agency chosen: [facility]       Comments: [facility] has reviewed the patient's e-record. [facility] will follow this patient's hospital course for home care needs; as well as to assist with a planning a safe [facility] discharge, and effective home care plan. Please call for questions or concerns regarding the discharge plan. Thank you for this referral.     Leanne Lovely LPN  LPN Home Care Coordinator   [facility]  [facility]: [phone]  F: [phone]

## 2023-10-18 NOTE — Provider Consult (Addendum)
 Nephrology Consult Note8/10/2023 8:57 AMReason for Consult: Assistance and management of ESRD Chief Complaint: BLE WeaknessHistory of Present Illness:Shane Pearson is a 63 y.o. 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 On exam today he denies chest pain, SOB. Does report feeling like he has some extra volume on him but due to weight loss and decrease in appetite they are having a difficult time establishing dry weight. Hospital course thus far includes imaging notable for chest x-ray, MRI spine including cervical, thoracic, and lumbar all with final results pending. The nephrology service was consulted to assist with arranging for dialysis needs. Subjective Past Medical History[1]Past Surgical History[2]Family History[3]Social History Socioeconomic History  Marital status: Married Tobacco Use  Smoking status: Former   Packs/day: 0.30   Years: 15.00   Additional pack years: 0.00   Total pack years: 4.50   Types: Cigarettes   Quit date: 08/02/1992   Years since quitting: 31.2  Smokeless tobacco: Never Substance and Sexual Activity  Alcohol use: No  Drug use: No Allergies[4]Medications Prior to Admission Medication Sig  walker Exact equipment needed: Rolling walker Specific length of time needed: LifetimeICD10 Code: R26.2 Ht: 5' 5   Wt: 175 lbs  midodrine  (PROAMATINE ) 5 mg tablet Take 3 tablets (15 mg total) by mouth 3 times daily.  magnesium  oxide (MAGNESIUM  OXIDE -MG SUPPLEMENT) 400 (241.3 mg) mg tablet Take 1 tablet (400 mg total) by mouth daily.  aspirin  81 MG EC tablet Take 1 tablet (81 mg total) by mouth daily.  fluticasone  (FLONASE ) 50 MCG/ACT nasal spray Spray 1 spray into nostril daily.   atorvastatin  (LIPITOR) 10 MG tablet Take 2 tablets (20 mg total) by mouth daily (with dinner).  fludrocortisone  (FLORINEF ) 0.1 mg tablet Take 1 tablet (0.1 mg total) by mouth 3 times daily.  b complex-vitamin c-folic acid  (NEPHRO-VITE) tablet Take 1 tablet by mouth daily.  calcium -vitamin D  (OSCAL-500) 500-200 MG-UNIT per tablet Take 1 tablet by mouth 2 times daily.  cinacalcet  (SENSIPAR ) 30 MG tablet Take 1 tablet (30 mg total) by mouth daily.  omeprazole  (PRILOSEC) 20 MG capsule Take 2 capsules (40 mg total) by mouth daily (before breakfast).  mirtazapine  (REMERON ) 7.5 mg tablet Take 1 tablet (7.5 mg total) by mouth nightly for poor appetite/insomnia.  acetaminophen  (TYLENOL ) 325 MG tablet Take 2 tablets (650 mg total) by mouth every 4-6 hours as needed for Pain.  Objective Current Vitals Vitals Range (24 Hours) BP 127/68 (BP Location: Right arm)   Pulse 83   Temp 36.5 C (97.7 F) (Infrared)   Resp 20   Ht 1.651 m (5' 5)   Wt 87.1 kg (192 lb)   SpO2 96%   BMI 31.95 kg/m  BP: (52-127)/(30-68) Temp:  [36.5 C (97.7 F)-37 C (98.6 F)] Temp src: Infrared (08/08 0752)Heart Rate:  [62-98] Resp:  [16-24] SpO2:  [94 %-99 %] Height:  [165.1 cm (5' 5)] Weight:  [87.1 kg (192 lb)-87.3 kg (192 lb 7.4 oz)]  Physical Exam:Constitutional:General: Not in acute distress, laying in bedHENT:Head: Normocephalic and atraumaticNose: Nose normalCVS: Other: +1 edema BLEPulmonary: Effort: No respiratory distressMSK: Warm and well perfusedNeuro: Mental status: Alert Psych:General: interactiveAccess: HDTC, dressing CDIRecent Labs Lab 08/08/250048 08/07/251644 Sodium 136 137 Potassium 4.2 4.7 Chloride 93* 90* CO2 28 29* Anion Gap 15 18* UN 25* 20 Creatinine 4.87* 4.10* Glucose 95  101* Calcium  8.6 9.2 Albumin  --  3.6 Phosphorus 2.8 2.4* Magnesium  1.7 1.8   Component Value Date/Time  PTH 309.0 (H) 10/17/2023 1644  Aerobic bacterial pleural or thoracentesis fluid culture and stain Date Value Ref Range Status 10/01/2023 Lab Cancel  Final Bacterial Blood Culture Date Value Ref Range Status 10/17/2023 .  Preliminary 10/17/2023 .  Preliminary No results for input(s): UAPP, UCOL, UAGLU, KETONESU, USG, UBLD, UAPH, UPRO, UNITR, ULEU, URBC, UWBC, UMUC, [place], [place], SPECIFICGRAV, BLOODUA, PHUA, PROTEIN, NITRITEUA, LEUKESTERASE, GLUCOSESIE, KETONESIEM, PUSG, BLOODSIEMEN, PUAPH, PROTEINUA, PUNIT, LEUKOCYTESIE in the last 8760 hours. Recent Labs Lab 08/08/250048 08/07/251644 WBC 11.5* 13.4* Hemoglobin 9.3* 10.9* Hematocrit 32* 37 Platelets 478* 551*   Component Value Date/Time  FE 65 10/17/2023 1644  IBC 176 (L) 10/17/2023 1644  FESAT 37 10/17/2023 1644  FER 881 (H) 10/17/2023 1644 No results for input(s): INR, PTT in the last 168 hours. Current MedicationsScheduled Meds: aspirin   81 mg Oral Daily  atorvastatin   20 mg Oral Daily with dinner  B complex-vitamin C-folic acid   1 tablet Oral Daily  calcium -vitamin D   2 tablet Oral BID  cinacalcet   30 mg Oral Daily  fludrocortisone   0.1 mg Oral TID  fluticasone   1 spray Nasal Daily  magnesium  oxide  400 mg Oral Daily  midodrine   15 mg Oral TID  pantoprazole   40 mg Oral QAM  enoxaparin   30 mg Subcutaneous Daily @ 2100 Continuous Infusions:PRN Meds:heparin  (porcine), heparin  (porcine), sodium chloride , acetaminophen , dextrose , polyethylene glycol AssessmentGreg Pearson MRN: [id number] is a 63 y.o. Male with PMH of ESRD on HD TTS at [facility] who was last dialyzed on 8/7 who is admitted to the hospital with BLE weakness.PlanESRD on HD UUD:Indz adjust medications to CrCl <10 mL/sContinue nephroviteWill plan for 2 hour HD today due to gadolinium contrast administration as per plan from overnight nephrology  discussionWill plan for routine HD tomorrowWill dialyze as per schedule with the following prescription:Active Dialysis Orders Dialysis  Hemodialysis   Frequency: 1 Time   Start Date/Time: 10/18/23 0858    Number of Occurrences: 1 Occurrences   Order Questions:    Dialyzer F180    K/Ca (meq) 2K/2.5Ca    Na (meq) 138    Na Modeling None    Bicarb (meq) 35    Dialysate Temp (C) 36    BFR (mL/min) 400    DFR (mL/min) 1.5x BFR    Duration (hours) 2    EDW (kgs) 77    Access CVC    Vital Signs Vitals signs at the start of treatment and every 30 minutes    Vital Signs Check temperature at the start of treatment and at the end of treatment    Monitoring May place patient on telemtry per nursing descretion for concerns regarding stability    Notify dialysis provider for Pulse Less Than 50    Notify dialysis provider for Pulse Greater Than 130    Notify dialysis provider for Temperature (Celsius) Above 38    Notify dialysis provider for Systolic Blood Pressure Below 90    Notify dialysis provider for Systolic Blood Pressure Above 819    Fluid removal (L) 1-3L, sbp >90    Fluid removal instructions Decrease fluid removal goal by 500 ml  if blood pressure is decreasing or HR increasing. Reassess response every 15 minutes.    Fluid removal instructions May increase fluid removal goal by 500 ml if blood pressure and HR are stable.  Reassess response every 15 minutes.    Fluid removal instructions  Decrease fluid removal to minimum if call/hold parameters for BP and/or HR exceeded.  Reassess response every 15 minutes.    Fluid removal instructions If no BP/HR response in 15 minutes after notifying MD, give 200 ml isotonic saline.  Reassess response every 15 minutes.  Hemodialysis   Frequency: Every Tues,Thurs & Sat   Start Date/Time: 10/19/23 0600    Number of Occurrences: Until Specified   Order Questions:    Dialyzer F180    K/Ca (meq) 3K/2.5Ca     Na (meq) 138    Na Modeling None    Bicarb (meq) 35    Dialysate Temp (C) 36    BFR (mL/min) 400    DFR (mL/min) 1.5x BFR    Duration (hours) 4    EDW (kgs) 77    Access CVC    Vital Signs Vitals signs at the start of treatment and every 30 minutes    Vital Signs Check temperature at the start of treatment and at the end of treatment    Monitoring May place patient on telemtry per nursing descretion for concerns regarding stability    Notify dialysis provider for Pulse Less Than 50    Notify dialysis provider for Pulse Greater Than 130    Notify dialysis provider for Temperature (Celsius) Above 38    Notify dialysis provider for Systolic Blood Pressure Below 90    Notify dialysis provider for Systolic Blood Pressure Above 819    Fluid removal (L) 1-3L, sbp >90    Fluid removal instructions Decrease fluid removal goal by 500 ml  if blood pressure is decreasing or HR increasing. Reassess response every 15 minutes.    Fluid removal instructions May increase fluid removal goal by 500 ml if blood pressure and HR are stable.  Reassess response every 15 minutes.    Fluid removal instructions Decrease fluid removal to minimum if call/hold parameters for BP and/or HR exceeded.  Reassess response every 15 minutes.    Fluid removal instructions If no BP/HR response in 15 minutes after notifying MD, give 200 ml isotonic saline.  Reassess response every 15 minutes.  Access:HDTCDialysis medications:Patients outpatient dialysis packet fully reviewed. Medications administered at outpatient dialysis have been ordered inpatient. Electrolytes:Most recent potassium level 4.2- Will dialyze against 3K bathNa acceptableCKD-BMD:Ca & Phos acceptableContinue calcitriol  0.75 mcg TTSContinue sensipar  60 mg daily Acid-Base Disorders: Will keep within desired goal range (serum bicarb 22-25) with RRT against 35 bicarbHemodynamics: Midodrine  10 mg TID and  fludrocortisone  0.1 mg TID Per chart review known to have BP 70-80s post-HD- EDW 77 kgWill remove fluid with scheduled HD treatmentsAnemia: Hgb below goalHemoglobin goal 10-11.5 gm/dlNo ESA indicated at this time Will continue IV iron infusionTransfusion of PRBC as needed per primary teamNext dialysis this afternoon. Remainder of management per primary team.Thank you for allowing us  to participate in the care of this patient.Please call with any questions. Attending recommendations to follow. Marca Pang, NPNephrology DivisionUniversity of Olympia Heights Medical Center8/10/2023 8:57 AM Attending Addendum:I saw and evaluated the patient, and I provided the substantive portion the medical decision making. I approved the management plan. I take responsibility for the plan and the patient's management. 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 MRI spine overnight with gadolinium overnight to assess for cord compression. Seen and  examined on HD today. He is feeling well. No CP, SOB, or cramping. He notes chronic hypotension for which he takes midodrine . BP (!) 89/49   Pulse 66   Temp 36.2 C (97.2 F)   Resp 18   Ht 1.651 m (5' 5)   Wt 87.1 kg (192 lb)   SpO2 96%   BMI 31.95 kg/m Lying flat, resting comfortably, no acute distressRRR, no murmur, 1+ bilateral ankle edemaLungs are CTAB anteriorlyHDTC dressing c/d/I, BFR 400 mL/minuteK 4.2, HCO3 28, Ca 8.6, Phos 2.8, Hgb 9.3ESRD due to uncertain but possible HTN/nephrosclerosis - 2 hours HD today as he had gadolinium overnight. Using 3K bath today. Will have his usually scheduled HD again tomorrow. Anemia - Hb is below goal, with suspected  renal origin carcinoma, holding ESA. CKD-MBD - Continue calcitriol  and sensipar . Chronic hypotension - Asymptomatic hypotension on HD today. Continue on midodrine  and fludrocortisone . Bernice JONELLE Sickles, MD 2:41 PM 10/18/2023  [1] Past Medical History:Diagnosis Date  Arthritis   CHF (congestive heart failure)   Colon polyp   Dialysis patient   ESRD (end stage renal disease)   GERD (gastroesophageal reflux disease)   Hypercholesterolemia   Hypotension   Ischemic colitis   Thyroid  disease   hypo [2] Past Surgical History:Procedure Laterality Date  DIALYSIS FISTULA CREATION Bilateral   4 fistulas have failed- ? d/t low BP and small veins  HERNIA REPAIR    PR LIGATION/BIOPSY TEMPORAL ARTERY Right 10/04/2023  Procedure: TEMPORAL ARTERY BIOPSY RIGHT EYE;  Surgeon: Lynn Ervin Edelman, MD;  [facility] MAIN OR;  Service: Ophthalmology  Right inguinal hernia repair   [3] Family HistoryProblem Relation Name Age of Onset  Diabetes Mother    Asthma Mother    Coronary artery disease Father   [4] AllergiesAllergen Reactions  Penicillins Swelling   Face  Coconut Flavoring Agent (Non-Screening) Nausea And Vomiting   All sources coconut (including raw) --causes regurgitation   Amoxicillin Rash  Baclofen Other (See Comments)   confusion  No Known Latex Allergy

## 2023-10-18 NOTE — Progress Notes (Addendum)
 PA cross cover note:Notified by nursing: Asked to follow up MRI scans overnight out of concern for chord compression BP (!) 83/46 (BP Location: Right arm)   Pulse 62   Temp 37 C (98.6 F) (Infrared)   Resp 24   Ht 1.651 m (5' 5)   Wt 87.1 kg (192 lb)   SpO2 96%   BMI 31.95 kg/m Assessment/Plan: -called over to strong radiology, made aware -final reads still pending, will signout to day team -hypotension largely chronic, will add additional bolus Shane Pearson, PA08/08/256:11 AM

## 2023-10-18 NOTE — Comprehensive Assessment (Signed)
 10/18/23 1204 Contact Information Spokesperson Name Shane Pearson Relationship to Patient  Spouse Phone Number(s) [phone] Has Spokesperson been notified of admission? Yes Is spokesperson the patient's caregiver after discharge? Yes Discharge transportation Ride to/from facility Shane Pearson Relationship to Patient  Spouse  10/18/23 1156 Demographics Religious Beliefs Other (comment(Pentecostal) [place] of Residence [place] Marital status Married Ethnicity/Race African American Primary Language English Do you have animals or pets at home? No Risk Factors Risk Factors Adjustment to Dx/Injury/Illness Health Care Directives Screen (Also required for Hospice Patients) *Has patient (or family) completed any of the following? (select all that apply) Health Care Proxy (HCP);Power of [place] ([place]) HCP Name wife Shane Pearson, alternate is mom Shane Pearson HCP Phone Number Shane Pearson [phone] HCP available for inclusion in the chart? Yes HCP in chart Yes POA Name wife Shane Pearson [facility] Phone Number [phone] POA available for inclusion in chart? No Living Situation Living Situation Home Lives With Spouse;Other (comment)(mom) Home Geography Type of Home [place] Home # Steps to Enter Home 2(from garage) Bedroom First floor Bathroom First floor - full Palliative Care Assessment Person assessed Patient Coping Status Appropriate to Stressor Baseline ADL functioning Transfers Independent Ambulation Independent Assistive Device Walker;Crutches(crutch at baseline, RW recently) Bathing/Grooming With assistance Meal Prep With assistance Able to feed self? Yes Household maintenance/chores With assistance Able to drive? Yes(but not since last week, wife does the driving) Dialysis Does Patient have Dialysis? Yes Dialysis Status HD Where [facility] When TTS Time 23 Transport wife Income Information  Vocational Full time employment(working from home) Income Situation Patient Employment (wages) Prescription Coverage and has Pharmacy Used [facility]. Home Care Services Do you currently have home care services? No Current Home Equipment Adjustable bed;Crutches;Shower chair;Grab bars in shower/tub;Walker (rolling) What Matters What is/will be most important to you during your hospital stay? figuring out what needs to be done to get better What matters most to you when you leave the hospital? getting better, continuing to work to provide for family SW reviewed chart and met with pt to complete assessment, Pt called his wife who was on speaker phone for assessment as well. Pt lives with his wife and his mom in a ranch home. He is typically independent with mobility, was using a crutch and more recently has been using a RW. He has been needing increasingly more help at home recently including help with ADLs and IADLs. Wife is home with pt to provide 24/7 assistance. She drives him to and from dialysis. No home oxygen or home care currently. Wife reports she contacted [facility] to request home PT/OT and is aware referral will need to come through [place]. SW made [facility] referral. Pt has cleared PT for a home discharge. OT eval is pending. SW will continue to follow as needed. Shane Pearson, LMSW Pager [phone]/10/2023 12:11 PM

## 2023-10-18 NOTE — Progress Notes (Signed)
 Pharmacy Admission Medication HistoryMedication list reviewer: Marissa Kalen, CPhT Medication History TechnicianAfter reviewing the current home medication list, writer has identified the following:Added:noneRemoved (medication not currently prescribed):Mirtazapine   tablet ('take 7.5mg  PO nightly') per patient, took once and discontinued due to feeling very groggy the next day.Omeprazole  capsule ('take 40 mg PO daily') per PCP med listened patient, no longer prescribed. No dispense history found.Dose/form/route/frequency/indication clarified and adjusted:Change Acetaminophen  formulation from 325 mg tablet to 650 mg CR tablet, and update directions to: 'take 1300 mg PO 3 times daily' per patient.Change Atorvastatin  formulation from 10 mg tablet to 20 mg tablet per PCP med list and [facility] dispense history (09/22/23, 30 day supply). No change in dosage.Change Cinacalcet  tablet from: 'take 30 mg PO daily' to: 'take 60 mg PO every evening' per [facility] med list and [facility] dispense history (09/15/23, 30 day supply).Change Midodrine  tablet from: 'take 15 mg PO 3 times daily' to: 'take 10 mg PO 3 times daily' per PCP med list, [facility] dispense history (09/25/23, 30 day supply), and patient.Additional comments:Pharmacy Tech (Marissa Ladona, CPhT) reviewed this patient's medication history ab:[facility]zcpztpwh [facility] PCP AVS Obtained via Care EverywhereReviewing [facility] medication list Obtained via Care EverywhereReviewing patient's chartReviewing outpatient medication dispense historyCalling patient's pharmacies ([facility])Interviewing patientPlease fill discharge medications at [facility] and deliver bedside per patient's request.No dispense history found for Nephro-Vite tablet: patient states he takes this daily.Patient is otherwise up-to-date on prescription refills [facility] dispense [facility].Patient is familiar  with all home medication names and directions.Writer reviewed allergies and intolerances on file with patient, and made the following changes: Add missing reaction to Amoxicillin: facial swelling per patient.Add missing reaction to Penicillin: rash per patient.Please see separate chart note for PCN allergy review.Marissa Kalen, CPhTI personally reviewed the home medication list obtained by the Medication History Technician. The home list is updated and inpatient medications have been reviewed.Anette Jenkins Bathe PharmD, BCPSTransitional Care PharmacistHighland [address], [place] [address]Eynwz: [phone]: [phone]: [phone] Prior to Admission Medication Sig  acetaminophen  650 mg CR tablet Take 2 tablets (1,300 mg total) by mouth 3 times daily.  aspirin  81 MG EC tablet Take 1 tablet (81 mg total) by mouth daily.  atorvastatin  (LIPITOR) 20 mg tablet Take 1 tablet (20 mg total) by mouth daily (with dinner).  b complex-vitamin c-folic acid  (NEPHRO-VITE) tablet Take 1 tablet by mouth daily.  calcium -vitamin D  (OSCAL-500) 500-200 MG-UNIT per tablet Take 1 tablet by mouth 2 times daily.  cinacalcet  (SENSIPAR ) 30 MG tablet Take 2 tablets (60 mg total) by mouth every evening.  fludrocortisone  (FLORINEF ) 0.1 mg tablet Take 1 tablet (0.1 mg total) by mouth 3 times daily.  fluticasone  (FLONASE ) 50 MCG/ACT nasal spray Spray 1 spray into nostril daily.  magnesium  oxide (MAGNESIUM  OXIDE -MG SUPPLEMENT) 400 (241.3 mg) mg tablet Take 1 tablet (400 mg total) by mouth daily.  midodrine  (PROAMATINE ) 10 mg tablet Take 1 tablet (10 mg total) by mouth 3 times daily.  walker Exact equipment needed: Rolling walker Specific length of time needed: LifetimeICD10 Code: R26.2 Ht: 5' 5   Wt: 175 lbs
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
## 2023-10-18 NOTE — Continuity of Care (Addendum)
 [facility] referral status handoff: [x]   Home Care referral sent (agency, date):  10/18/23 [x]   Received Home Care acceptance (date): 10/18/23 [x]   Order request sent (date, provider):  10/18/23 CHARLENA Koyanagi, PA [x]   Start of care (date): 8/13/25Patient was accepted to [facility]. Please place 'AMB Referral to [facility]' order which is available in the Discharge activity Discharge Order Rec within the Medication Reconciliation workflow. The order can be found by searching for its title in the sidebar. Thank you.The accepted discipline(s):Physical Therapy Occupational TherapySkilled Nursing

## 2023-10-18 NOTE — Interdisciplinary Rounds (Signed)
 Interdisciplinary Discharge Communication Note8/8/2025Targeted Discharge Disposition : Home with servicesRounding was performed on: IDR Date: 10/18/23 at: IDR Time: 0900Interdisciplinary Rounding Participants: APP, SW, Care Coordinator, Consulting civil engineer, AttendingAdmit Date/Time:  10/17/2023  3:55 PM Principal Problem:  <principal problem not specified>The patient's problem list and interdisciplinary care plan was reviewed.EDD:  Discharge PlanningOngoing Issues  Addtional Ongoing Issues: medically acute Geographic Patient: YesTargeted Disposition Targeted Discharge Disposition : Home with servicesHome CareHome Care, Choiced?: Yes Accepting Home Care Agency: [facility]CMedical Equipment NeedsCurrent Home Equipment: Adjustable bed, Crutches, Shower chair, Grab bars in shower/tub, Environmental consultant (rolling) Respiratory Home Equipment : Not Applicable Medical Supplies Available: Not Applicable Equipment/Supplies Ordered: None       Delivery of D/C DME/medical supplies confirmed, if applicable: Not ApplicableTeaching Needs   CommunicationIs communication necessary with patient spokesperson? : NoWhy communication is not necessary: Patient has capacity  Current PCP:  Juliene Debby PARAS, MDScheduled Future Appointments:  Future Appointments  Monday October 21, 2023 1:30 PMSIMULATIONS with Ronal Blake, MD, GR_CT MACHINEWilmot Cancer Institute [facility] [place] (--) [phone]  Transportation     Prescriptions   Powell DELENA Scripture, RN3:39 PM

## 2023-10-18 NOTE — Discharge Summary (Signed)
 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 MRIs obtained as below without evidence of cord compression & were similar to prior. Discussed with rad-onc who recommended steroids and will follow up outpatient to begin xRT to LUE next week.  Nephrology followed along while inpt to provide dialysis. Worked with PT/OT who recommended home PT. Medically stable and cleared for discharge home on 10/18/23.      MRI Results: MR C Spine Degenerative changes involving the cervical spine, as detailed in the Findings section of this report.1. Diffusely diminished signal involving the visualized bone marrow consistent with sequela of chronic renal osteodystrophy.2. Multiple enhancing lesion the cervical spine consistent with bony metastases most notably involving C2, C4, and C7 similar in appearance to the prior examination. As noted on the prior exam, there is an expansile component both anteriorly and posteriorly involving the C7 vertebral body contributing to mild narrowing of the thecal sac, similar to the prior study. As noted on the prior exam, there is likely involvement of the left C6-7 and C7-T1 neural foramina similar to the prior exam suboptimally visualized due to motion artifact.3. Multiple enhancing lesions involving the posterior paraspinal musculature, grossly stable.4. Degenerative  changes involving the cervical spine without significant progression.5. No significant signal abnormalities or zones of abnormal contrast enhancement identified in the cervical spinal cord.6. Bilateral pleural effusions.7. The patient had difficulty remaining still during this examination. Motion artifact limits assessment.MR T Spine 1. The patient had significant difficulty remaining still during this examination the patient was unable to tolerate complete imaging. Precontrast sequencing is limited. Patient was unable to tolerate postcontrast imaging. The acquired images are limited due to motion artifact.2. Diminished signal involving the visualized bone marrow similar to prior study, consistent with sequela of renal osteodystrophy.3. Multiple foci of abnormal T2 signal elevation involving the thoracic spine most notably at the T6, T10, T11, and T12 levels similar to the prior exam. The findings are consistent with bony metastases.4. Enhancing lesions involving the paraspinal musculature described on the prior examination are suboptimally visualized on the current study due to motion artifact, limited sequencing, and lack of intravenous contrast but are grossly stable.5. Degenerative changes involving the thoracic spine without significant progression.6. No significant signal abnormalities identified within the thoracic spinal cord. No evidence of thoracic spinal cord compression7. Bilateral pleural effusions.MR L Spine 1. Multiple foci of abnormal signal and contrast enhancement scattered throughout the visualized bone marrow of the spine and sacrum consistent with diffuse bony metastases, similar in appearance to the prior study.2. Degenerative changes involving the lumbar spine without significant progression.3. Multiple heterogeneous enhancing lesions scattered throughout the visualized posterior paraspinal musculature, similar to the prior  study.4. Evaluation of the included portions of the abdomen is limited. Heterogeneous polycystic kidneys again noted, partially and suboptimally visualized. Bilateral pleural effusions are partially visualized. Retroperitoneal lymphadenopathy again noted.5. No evidence of compression of the conus medullaris or cauda equina.       Significant Med Changes: YesDecadron 4mg  BID Protonix  40 mg daily while on steroids Post hospitalization  To Do List for PCP: - FU with rad-onc next week to begin xRT to LUE (appt scheduled for 8/11 at 1:30 pm) Specialty Info: The patient's medication allergy history was reviewed.  Because of the previous reaction to penicillin, the patient may be a candidate for direct challenge and/or skin testing. Patient received a referral to follow up with Allergy and Immunology.Signed: Maurilio Koyanagi, PA  On: 10/18/2023  at: 4:39 PM

## 2023-10-18 NOTE — Progress Notes (Signed)
 Occupational TherapyInitial Eval Completed.  Patient is a 63 y.o.male who presents with leg weakness.Does OT need to see patient PRIOR to D/C: NoDischarge recommendation:   Prior Living Environment, Supervision during ADLs, Outpatient OT Equipment recommendations: None Hospital Stay Recommendations for nursing:  (1A RW to bathroom)Past Medical History: Diagnosis Date  Arthritis   CHF (congestive heart failure)   Colon polyp   Dialysis patient   ESRD (end stage renal disease)   GERD (gastroesophageal reflux disease)   Hypercholesterolemia   Hypotension   Ischemic colitis   Thyroid  disease   hypo Past Surgical History: Procedure Laterality Date  DIALYSIS FISTULA CREATION Bilateral   4 fistulas have failed- ? d/t low BP and small veins  HERNIA REPAIR    PR LIGATION/BIOPSY TEMPORAL ARTERY Right 10/04/2023  Procedure: TEMPORAL ARTERY BIOPSY RIGHT EYE;  Surgeon: Lynn Ervin Edelman, MD;  Location[facility] MAIN OR;  Service: Ophthalmology  Right inguinal hernia repair   *Bold Indicates co-morbidities affecting treatment and recoveryOccupational Profile relating to the present problem:Needs assistance for mobilityIn addition to the PMH and surgical history listed above, the comorbidities affecting treatment/ recovery: none notedPerformance deficits that result in activity limitations and/or performance restrictions: Decreased ADLs, transfers, endurance, and balance  (spouse is able to help with all ADLs and IADL needs PRN)Modification of tasks needed or assistance with assessments necessary to enable completion of evaluation:SBA RW sit>stand, CGA RW functional mobility, SBA RW toilet transfer Patient complexity:  low level as indicated by above personal factors, environmental factors and comorbidities in addition to their impairments found on physical exam. OT Assessment (HH / URR) - 10/18/23 9076    OT Tracking   (HH / URR)  OT Tracking (HH / URR) OT Assigned   Type of Session evaluation   SW Request? No    OT Last Visit  Visit (#)  0    Precautions  Precautions used Yes   Fall Precautions Visitor present;Staff present   partial co-treat with PT due to pt tolerance for skilled therapy. Pt's wife present at start of encounter.  LDA Observation Monitors   Patient Wearing Mask No   Writer wearing PPE including Gloves;Mask   Activity Order Activity as tolerated    Home Living (Prior to Admission)  Prior Living Situation Reported by patient;Obtained via chart review   Type of Home [place]   # Steps to Enter Home 2   Rails to [employer] Yes   # Of Steps In Home 12   to basement  Location of Bedroom First floor   Location of Bathroom First floor   Bathroom Shower/Tub Walk-in shower   Bathroom Toilet Raised    Current Home Equipment Adjustable bed;Grab bars in shower/tub;Shower chair;Crutches   pt having stair lift installed soon   Prior Function  Prior Function Reported by patient;Obtained during chart review   Level of Independence Needs assistance with ADLs;Needs assistance with homemaking;Needs assistance with functional transfers   Lives With Spouse;Family   wife and mother-in-law  Receives Help From Family   spouse  IADL Needs assistance   Vocational --   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 pt dis not quantify   Vision   Current Vision Wears corrective lenses    Cognition  Cognition No deficit noted   Level  of Alertness Appropriate responses to stimuli   Attention  Appears intact   Memory Appears intact   Following Commands Follows one step commands 100% of the time    UE Assessment  UE Assessment Full AROM RUE;Impaired AROM LUE    LUE  Strength  LUE Strength Testing Tested   Overall Strength Deficits    Shoulder Flex 3/5    Shoulder Ext  3/5    Elbow Flex 4/5      Elbow Ext 4/5      RUE Strength  RUE Strength Testing Tested   Overall Strength WNL strength 5/5    Bed Mobility  Supine to Sit Modified Independent;HOB elevated   Sit to Supine Modified Independent;HOB elevated   Additional Comments pt greeted and left in recliner with needs met and call bell within reach    Functional Transfers  Stand pivot transfer Modified Independent;Rolling Walker   Sit to Stand Stand By Assist;Rolling walker   Stand to Sit Modified independent;Rolling walker   Toilet Transfers Stand by assist      Transfer Device(s) Rolling walker   Functional Mobility Contact Guard;Rolling Walker;One person assist   Additional Comments pt uses shuffling gate when walking but no LOB or dizziness noted    Balance  Sitting - Static Independent    Sitting - Dynamic Supervision   Standing - Static Stand By Assist;Supported   Standing - Dynamic Stand By Assist;Supported    ADL Assessment  Toileting Stand by assist   simulated due to pt not needing to void at time of encounter     Assist Needed With --   pt able to manage gown functionally and sit/stand from toilet with RW. Pt states his wife is available to assist with all toileting needs PRN.     Where Toileting Assessed Standard toilet      Equipment Used Rolling walker   Additional Comment(s) pt declined oral care. Pt reports souse is present and available at all times within the home to assist with ADL needs. pt's wife assists with dressing at baseline.    Activity Tolerance  Endurance Tolerates 10 - 20 min activity with multiple rests    Functional Outcomes Measures  Functional Outcome Measures Yes    OT AM-PAC Self Care  Putting on and taking off regular lower body clothing? 3   Bathing (including washing, rinsing, drying)? 3    Toileting, which includes using toilet, bedpan, or urinal? 4   Putting on and taking off regular upper body clothing? 3   Taking care of personal grooming such as brushing teeth? 3   Eating Meals 4   Total Raw Score 20   CMS Score - Calculated 38.32%    Assessment  Assessment Impaired ADL status;Impaired UE ROM;Impaired UE strength;Impaired endurance;Impaired instrumental ADL's    Plan  OT Frequency 2-3x/wk   No acute OT needs Pt demonstrates adequate ADL skills for return to prior living environment    Recommendation  OT Discharge Recommendations Prior Living Environment;Supervision during ADLs;Outpatient OT   OT Discharge Equipment Recommended None   OT Hospital Stay Recommendations --   1A RW to bathroom  OT Next Visit Standing ADLs at sink, functional mobility, LB dressing   OT needs to see patient prior to DC  No    Multidisciplinary Communication  Multidisciplinary Communication PT, pt    Patient/Family/Caregiver Training  Patient/Family/Caregiver training Yes   Patient/Family/Caregiver training Role of occupational therapy in hospital and plan for evaluation;Benefit of OOB activity and participation in ADLs  while inpatient;Call don't fall, purpose of bed/chair alarm, and recommendations for nursing;Discharge planning    Time Calculation  OT Timed Codes 0   OT Untimed Codes 24   OT Unbilled Time 2 (pt asked for privacy to speak with his wife briefly during the encounter)   OT Total Treatment 24    Plan and Onset date  Plan of Care Date 10/18/23   Onset Date 10/17/23   Treatment Start Date 10/18/23    AMPAC Score Interpretation:Adapted from [employer], et al Association of AM-PAC "6 Clicks" Basic Mobility and Daily Activity Scores with discharge destination, 2021AM-PAC Raw Score of > 19 (standardized score > 40.22 %) indicates a high likelihood of discharge to home Adapted from [facility] et al Validity of h AM-PAC  6- Clicks Inpatient Daily Activity and Basic Mobility Short Forms (2014) AM-PAC raw score  >= 18 (standardized score >= 39, % impairment  < 47%) indicates a high probability of a discharge to [facility] raw score  >= 13 (standardized score >= 32.03, % impairment 63%) indicates pt is more likely to require IRF/SNF RAM-PAC raw score <12 (standardized score < 30.6 % impairment > 66%) indicates pt is more likely to require [employer], OT Please utilize [employer] for communication

## 2023-10-18 NOTE — Progress Notes (Signed)
 Hospitalist Progress Note LOS: 1 day Interval History/Subjective:Patient was seen evaluated this morning sitting in the chair.  Able to move his right upper extremity feeding himself no dysphagia noted during breakfastHe does have significant weakness left upper extremity in comparison to 3 weeks ago subjectively as per patient as well difficulty using the stairs.Intake/OutputIntake/Output Summary (Last 24 hours) at 10/18/2023 1154Last data filed at 10/18/2023 0623Gross per 24 hour Intake 1111.64 ml Output 0 ml Net 1111.64 ml  Vital Signs: Temp:  [36.5 C (97.7 F)-37 C (98.6 F)] 36.5 C (97.7 F)Heart Rate:  [62-98] 87Resp:  [16-24] 18BP: (52-127)/(30-68) 82/48 Physical Exam:General: Awake alert pleasantHEENT: NC/AT, EOMICardiovascular: S1-S2 nontachycardicPulmonary: Good airflow, nonlaboredGastrointestinal: Soft plus bowel soundCNS: Right lower extremity left lower extremity power 5/5.  Slight diminished strength with dorsiflexion both legs.  Right upper extremity 5/5.  Left upper extremity muscle 5/5 but diminished range of motion in ataxic finger-to-nose testExtr: No edemaSkin: Warm and dryData:Recent Labs Lab 08/08/250048 08/07/251644 WBC 11.5* 13.4* Hemoglobin 9.3* 10.9* Hematocrit 32* 37 Platelets 478* 551* Other Labs:  Recent Labs Lab 08/08/250048 08/07/251644 Sodium 136 137 Potassium 4.2 4.7 Chloride 93* 90* CO2 28 29* UN 25* 20 Creatinine 4.87* 4.10* Glucose 95 101* Calcium  8.6 9.2  X-rays the past 24 hours: MR lumbar spine without and with contrastResult Date: 8/8/20251. Multiple foci of abnormal signal and contrast enhancement scattered throughout the visualized bone marrow of the spine and sacrum consistent with diffuse bony metastases, similar in appearance to the prior study. 2. Degenerative changes involving the lumbar spine without significant  progression. 3. Multiple heterogeneous enhancing lesions scattered throughout the visualized posterior paraspinal musculature, similar to the prior study. 4. Evaluation of the included portions of the abdomen is limited. Heterogeneous polycystic kidneys again noted, partially and suboptimally visualized. Bilateral pleural effusions are partially visualized. Retroperitoneal lymphadenopathy again noted. 5.  No evidence of compression of the conus medullaris or cauda equina. END OF IMPRESSION MR spine thoracic without and with contrastResult Date: 8/8/20251. The patient had significant difficulty remaining still during this examination the patient was unable to tolerate complete imaging. Precontrast sequencing is limited. Patient was unable to tolerate postcontrast imaging. The acquired images are limited  due to motion artifact. 2. Diminished signal involving the visualized bone marrow similar to prior study, consistent with sequela of renal osteodystrophy. 3. Multiple foci of abnormal T2 signal elevation involving the thoracic spine most notably at the T6, T10, T11, and T12 levels similar to the prior exam. The findings are consistent with bony metastases. 4. Enhancing lesions involving the paraspinal musculature described on the prior examination are suboptimally visualized on the current study due to motion artifact, limited sequencing, and lack of intravenous contrast but are grossly stable. 5. Degenerative changes involving the thoracic spine without significant progression. 6. No significant signal abnormalities identified within the thoracic spinal cord. No evidence of thoracic spinal cord compression 7. Bilateral pleural effusions. END OF IMPRESSION MR cervical spine without and with contrastResult Date: 8/8/2025Degenerative changes involving the cervical spine, as detailed in the Findings section of this report. 1. Diffusely diminished signal involving the visualized bone marrow consistent  with sequela of chronic renal osteodystrophy. 2. Multiple enhancing lesion the cervical spine consistent with bony metastases most notably involving C2, C4, and C7 similar in appearance to the prior examination. As noted on the prior exam, there is an expansile component both anteriorly and posteriorly involving the C7 vertebral body contributing to mild narrowing of the thecal sac, similar to the prior study. As noted on the  prior exam, there is likely involvement of the left C6-7 and C7-T1 neural foramina similar to the prior exam suboptimally visualized due to motion artifact. 3. Multiple enhancing lesions involving the posterior paraspinal musculature, grossly stable. 4. Degenerative changes involving the cervical spine without significant progression. 5. No significant signal abnormalities or zones of abnormal contrast enhancement identified in the cervical spinal cord. 6. Bilateral pleural effusions. 7. The patient had difficulty remaining still during this examination. Motion artifact limits assessment. END OF IMPRESSION  Assessment/PlanActive Hospital Problems  Diagnosis  Leg weakness  Resolved Hospital Problems No resolved problems to display. Impression:1.  Progressive muscle weakness lower extremity and now progressing in the left upper extremity2.  Undifferentiated metastatic carcinoma with mets to brain bone and spine3.  CKD 5 on dialysis4.  Polycystic kidney disease5.  Hyperlipidemia6.  Congestive heart failure diastolic with chronic hypertension7.  Hypothyroidism8.  GERDPlan:1.  Progressive muscle weakness lower extremity and now progressing in the left upper extremity- On presentation given the progression of the weakness provided by the patient- Concern for cord compression or nerve root compression was concerned hence MRI of the cervical, thoracic, lumbar spine was performed.  Please refer to the formal MRI report in the chart however in summary shows  diffuse enhancing lesion in the cervical spine consistent with bony mets C2 C4 C7 without signal abnormality of concern for the cervical spinal cord compression.  Similar finding without any compression on the thoracic spinal cord, or in the lumbar spine.- Given the MRI finding without evidence of cord compromise it was deemed appropriate for discharge with follow-up with outpatient radiation oncology early next week and has been coordinated with rad-onc- Will place the patient on low-dose dexamethasone  4 mg twice daily with further outpatient taper with medical or radiation oncology.- Will need to follow-up with radiation oncology and medical oncology2.  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  Polycystic kidney disease5.  Hyperlipidemia6.  Congestive heart failure diastolic with chronic hypertension7.  Hypothyroidism8.  GERDDVT prophylaxis: Full CodeFamily communication: DC PlanningDischarge home today after hemodialysis with follow-up with radiation oncology [place] for [place]/c Date:    External Name: Shane Pearson Shane Pearson    [employer] Prov...[name]?, MD 10/18/2023   11:54 AM

## 2023-10-18 NOTE — Progress Notes (Signed)
 10/18/23 1500 [facility] Patient Class Review Patient Class Review Observation Patient Class effective 08/07/25Writer reviewed LOC Change IP to OBS  MOON with Patient on 10/18/23 at the time of 3:50 pm via phone # [phone]. Patient in dialysis and discharging immediately after so notices to be mailed to verified address on file per [facility] leadership direction. If the patient or their representative has additional questions after notification, they have been provided their Utilization Management's contact information, Giles Kwanza Cancelliere-Maxson [facility] RN [phone]), included on the written form once delivered. Copy of notice will be mailed certified to verified address:[address] [facility] [place] [address] Do not send certified per patient requestLisa Takima Encina-Maxson, BSN, RN  Utilization Management [facility] [address] [place], [place]  [address]Nqqprz: [phone]: [phone]

## 2023-10-18 NOTE — Progress Notes (Signed)
 Report Given ToHD initiated with out difficulty. Tolerated 2 hour HD treatment with out difficulty, 2 kg removed. Slept the majority of the treatment. Stable return noted, report called to floor Nurse. Descriptive Sentence / Reason for Admission Duration of Treatment (minutes): 120 minutesComplications: NoneActive Issues / Relevant Events Review of medications administered  such as routine meds, antibiotics, and heparin :Heparin  dwelled in CVC ports post treatment. Vital Signs: VSSTo Do ListMedications still needing administration: None related to Hemodialysis.Dressing change due :08/15/25Anticipatory Guidance / Discharge PlanningDate/Time of Next Treatment: TBD by NephrologyPlease weigh daily to maintain fluid balance, Thank You!!Edsel Meadows RN

## 2023-10-18 NOTE — Plan of Care (Signed)
 Problem: Inadequate oral intakeGoal: Nutritional status maintained or improvedOutcome: Progressing towards goalIntervention: Liberalize DietNote: Current diet is Low K+, Low PO4, Low Fat.  Consider liberalizing to Regular as able to promote PO intakes, (K/PO4 are WNL at this time), d/w provider.Intervention: Camera operator (oral supplement)Note: Recommend Nepro with CarbSteady BID (425 kcal/19g pro per serving), pt agreeableIntervention: Vitamin/mineral supplementNote: Continue daily Nephrovite; also ordered with Calcium  with D BIDIntervention: OtherNote: Document % intakes     Initial Nutrition AssessmentLast weight 10/17/23 87.1 kg (192 lb) AssessmentEstimated Nutrient Needs: Estimated Caloric NeedsTotal Caloric Estimated Needs: 1550-1850 kcalsNeeds based on: Kcal/kg - specify (Comment) (25-30 kcal/kg)Needs based on (weight): Ideal body weight (62 kg)Estimated Protein NeedsTotal Protein Estimated Needs: 74-93 gNeeds based on (weight): Ideal body weight (ht 65 IBW 62 kg (1.2-1.5 g/kg))Malnutrition Screening Tool Consult received for unsure weight loss, poor appetite PTA63 y.o. 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 Pt sitting on edge of bed eating breakfast, spouse at bedside. He reports poor appetite for awhile now, even prior to recent [facility] admission last month. Needs soft foods at times d/t chewing difficulty. Likes the Nepro shakes, received these at [facility] with good acceptance also. UBW appears to be in the mid 180's-low 190's,  dropped to the mid 170's by the end of July 2/2 poor PO, now up to 192 lbs suspect in s/o volume/missed HD. No pressure injuries.  Labs reviewed. Pt is at risk for malnutrition. Plan/Recommendations as outlined Jobie Escort, MS RD CNSCClinical NutritionPhone [phone]

## 2023-10-18 NOTE — Plan of Care (Signed)
 Problem: Risk for Falls  Goal: No falls during hospitalization  Description: Patient will not fall during hospitalization.  Outcome: Adequate for discharge     Problem: Knowledge Deficit  Goal: Knowledge - personal safety  Description: Patient will verbalize understanding of fall prevention.  Outcome: Adequate for discharge

## 2023-10-18 NOTE — Progress Notes (Signed)
 REFERRAL INFORMATION NOTE Hospital/SNF Discharge Date: HH 08/09/2025Patient interview: Person completing interview: Rexene Garner MSN AGNP Date of interview: 08/09/2025Spoke with: Shane Pearson [facility]) Home visit address/ contact phone: [address] [place] [address] [phone] (M) Has the patient had any falls (with or without injury) in the last 3 months? noHas the patient had any near misses (where they would have fallen, but caught self) in the last 3 months? noAre you currently going to outpatient therapy for any services? noDo you currently have a nurse or therapist coming to your home to see you? noWhich Agency? noDesignated caregiver (name, phone #, and availability): Shane Pearson [facility]) [address] [place] [address] [phone] (H) Current DME or supplies in home: walker CIRP Patient? No The following services have been requested by a referring Provider: ____Nursing ____Physical Therapy ____Occupational Therapy____Speech Therapist____Medical Social Worker____Home Health AideThese services have been arranged with [facility] [phone]:Primary service for SOC:____Nursing _x__Physical Therapy [facility] clinician to evaluate for:____Physical Therapy ____Occupational Therapy____Speech Therapist____Medical Social Worker____Home Health Aide__x__N/AProvider  Juliene Debby PARAS, MD has agreed to the changes in services above.The agency will call to schedule first home visit date for (CHN,PT etc) services within 24-48 hours of facility discharge, and pt/family are in agreement.Special instructions for scheduling first visit: Calla head The following medical equipment and/or supplies have been arranged:____walker____wheelchair____commode____shower chair____Personal Emergency Response System____oxygen____cane____hospital bed____infusion  supplies____tube feeding supplies____catheter supplies____ostomy supplies____wound care supplies____other: ___X_No medical equipment and/or supplies have been arranged.Blood draws: none required Focus of care and identified patient/caregiver teaching needs at home: Metastatic Renal cell carcinomaBrief Summary of Your Hospital Course (including key procedures and diagnostic test results):You came to the hospital with weakness, most notably in your left arm. You had MRIs of you spine which were similar to prior. You will follow up with radiation oncology outpatient to receive radiation therapy to your left arm. While you were here, you worked with PT and OT who recommended home PT/OT. You were medically stable and cleared for discharge home today.  Your instructions:- Take all medications as described below, see attached list - Follow up with oncology - Follow up with radiation-oncology: Appointment scheduled for Monday 8/11 at 1:30 pm at [facility].  Medication changes: - Decadron  4mg  twice daily (take one in the morning and one in the afternoon ~2-3 pm) - Protonix  40 mg daily while on steroids  What to do after you leave the hospital: Recommended diet: Low Potassium Recommended activity: activity as toleratedConfirmed Attending Provider [phone] Provider): Juliene Debby PARAS, MDProvider Address/ Phone/ Fax: [address] / [place] [address] Confirmed by (person who obtained confirmation): Suzen Schwab, LPN Date of Confirmation: 10/18/23 Date last seen: 8/6/25Who does the patient see in the office that will be signing home care orders: Juliene Debby PARAS, MDPerson [facility] communicated with at providers office: Tammy Method:____Secure Chat____Telephone Encounter__x__Phone Call ____Other: Ancillary provider: n/aWill sign home care orders related to: Ancillary provider Address/Phone/Fax: Insurance  Information:Patient/family informed of home care/ DME copays__x__not applicable ____YesPatient has been screened for Medicare as a Haematologist (MSP)____eligible for [facility]   __x__not eligibleMVA or WC case: N/aDate of injury/accident: [employer] name and address: Claim #: Actor phone #/FAX #: The above arrangements are based on an in-hospital evaluation. It is short-term and will be re-evaluated by the Home Health Care Nurse in the home on a regular basis, as per physician's order.Rexene Garner MSN, AGNP RN Home Care Coordinator [facility]Ph: [phone]: [phone]

## 2023-10-18 NOTE — Progress Notes (Signed)
 [facility]  Referral for home care service has been received.Comments:referral cancelled per coordinator pt spouse has contacted another home care agency. Carlis Rossie Scarfone-Mryglod [facility] Care Coordinator [phone]

## 2023-10-18 NOTE — Progress Notes (Signed)
 SW reviewed chart and attempted to meet with pt to complete SW assessment. Pt was meeting with Oncologist. Will try again at a later time. Alfonso CHRISTELLA Gull, LMSW Pager [phone]/10/2023 11:25 AM

## 2023-10-19 ENCOUNTER — Non-Acute Institutional Stay

## 2023-10-20 ENCOUNTER — Non-Acute Institutional Stay

## 2023-10-20 ENCOUNTER — Other Ambulatory Visit: Payer: Self-pay

## 2023-10-20 NOTE — Initial Assessments (Signed)
 Assessment findings: LE weakness, fatigue, Pain, gait deficit, transfer deficit due to progressive weakness, suspected carcinoma of likley renal originSkilled Need/Focus of Care: PT to address weakness, monitor progress, address safety concerns, gait/transfer training/educationCaregiver availability and willingness: CG available to assist as needed.Discharge plan: D/C expected within 3-4 weeks with patient independence.Plan for next visit: Progress HEP if appropriate. Encourage/Educate on gait/mobility to promote both healing and independence. Evaluate/educate on car transfer for access to medical appointments when appropriate.

## 2023-10-21 ENCOUNTER — Other Ambulatory Visit

## 2023-10-21 ENCOUNTER — Ambulatory Visit: Attending: Radiation Oncology | Admitting: Radiation Oncology

## 2023-10-21 DIAGNOSIS — C7951 Secondary malignant neoplasm of bone: Secondary | ICD-10-CM | POA: Insufficient documentation

## 2023-10-22 ENCOUNTER — Ambulatory Visit

## 2023-10-22 ENCOUNTER — Telehealth: Payer: Self-pay

## 2023-10-22 LAB — BLOOD CULTURE
Bacterial Blood Culture: 0
Bacterial Blood Culture: 0

## 2023-10-22 NOTE — Progress Notes (Signed)
 Patient tolerated simulation well.  A CT was obtained with the patient in the treatment position. Tattoos were placed on the body to assist with daily treatment set-up. The CT scan will be exported to the [facility] planning system for delineation of the treatment target and normal tissues. A radiation plan will then be generated.

## 2023-10-22 NOTE — Telephone Encounter (Signed)
 Calling Shane Pearson to discuss plan. He was recently discharged from the hospital. His pain is better managed since he is on dexamethasone . He had radiation simulation yesterday. He does not have a start date yet, but he knows he will undergo 5 fractions. Informed [name] Dr. Bronson would like to see him for a FUV after he completes radiation and CARIS results can be reviewed at that time. Writer will check back later this week to see if radiation schedule is finalized. Will update team.

## 2023-10-23 ENCOUNTER — Other Ambulatory Visit

## 2023-10-23 ENCOUNTER — Other Ambulatory Visit: Payer: Self-pay

## 2023-10-24 ENCOUNTER — Other Ambulatory Visit

## 2023-10-24 ENCOUNTER — Ambulatory Visit

## 2023-10-24 NOTE — Case Communication (Signed)
 pt opened to home OT today

## 2023-10-24 NOTE — Initial Assessments (Signed)
 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 MRIs obtained as below without evidence of cord compression & were similar to prior. Discussed with rad-onc who recommended steroids and will follow up outpatient to begin xRT to LUE next week. PRIOR FUNCTIONAL STATUS:  pt. lives with wife and mother  in ranch home, office in basement.  Stair glide to basement.Ind with all mobility, including driving, self-care and IADls.  Pt works full time as an Airline pilot in basement office. CAREGIVER INVOLVEMENT: supportive wife who is off work as HHAASSESSMENT FINDINGS: UE FUNCTION:  R UE AROM WFL, strength 4+/5. Left shoulder AROM scapular plane 50*, PROM to 90*, WFL otherwise. Strength generally 4/5RHD, difficulty typing.  Denies numbness and tinglingADL STATUS: UB BATHING: min aUE DRESSING: min a LE BATHING: max aLE DRESSING: max aTOILETING: independentIADL STATUS: dependentFUNCTIONAL MOBILITY difficulty getting in/out of carTOILET TRANFERS: supervisionTUB TRANFERS: NA PICC lineCOGNITION: A&O x3Refer to [facility] OT ASSESSMENT for additional details. TREATMENT:  Instructed pt on reasoning, rationale, goals for occupational therapy.  Instructed pt and wife on benefit of getting additional walker for basement and issued loan closet information. Instructed pt on benefit of maximizing participation with self care to build endurance and strength.Assessment: Pt appears to have good understanding of  instruction/education reviewed.PATIENT GOALS: continue to workSKILLED OT NEEDS: Pt presents with decreased independence and safety with self-care,  functional mobility with impaired strength and energy. COMMUNICATION: notified MD and team of OT SOCDISCHARGE PLAN: Patient will be discharged from OT services to safe environment when all goals are met, patient has received maximum benefit or patient declines further services.PLAN: skilled OT 1x/week x 8 weeks to maximize pt's independence and safety with self-care, IADLs, functional mobility and UE AROM/strength.

## 2023-10-25 ENCOUNTER — Other Ambulatory Visit

## 2023-10-28 ENCOUNTER — Encounter: Admitting: Radiation Oncology

## 2023-10-28 ENCOUNTER — Other Ambulatory Visit: Payer: Self-pay

## 2023-10-28 ENCOUNTER — Encounter: Payer: Self-pay | Admitting: Gastroenterology

## 2023-10-28 NOTE — Home Health (Signed)
 Changes from previous visit: pt much brighter, friendlier this visit.  Reports getting caught up on his work which has been a relief for him. Objective: Issued and instructed pt on HEP with green putty for gross grasp left and pinching to remove 10 pony beads. Instructed pt on towel slides for shoulder flexion with movement increasing tingling in left arm.  Instructed pt on self ROM for shoulder flexion.  Movement of left shoulder painful.  Instructed pt to complete exercises if tolerable. Instructed pt on benefit of maintaining mobility in left shoulder. Demonstrated for pt benefit of using walker for support with walking, focusing on taking full steps.  Instructed pt to use walker for mobility for safety and improved gait pattern.  Assessment findings: Pt tolerated putty HEP well but symptomatic with left shoulder exercises.  Plan for radiation treatments but not scheduled yet.  Skilled Need/Focus of Care: continue with ADL/IADL, functional transfer training, therapeutic exercise to maximize strength and endurance.Caregiver availability and willingness: supportive wifeDISCHARGE PLAN: Patient will be discharged from OT services to safe environment when all goals are met, patient has received maximum benefit or patient declines further services. Plan for next visit: Pt education to maximize safety and independence

## 2023-10-29 ENCOUNTER — Telehealth: Payer: Self-pay

## 2023-10-29 ENCOUNTER — Ambulatory Visit

## 2023-10-29 ENCOUNTER — Encounter: Payer: Self-pay | Admitting: Oncology

## 2023-10-29 NOTE — Telephone Encounter (Signed)
 Calling Shane Pearson to inquire if he has received radiation schedule. He has sim completed on 8/11. He was told that he would receive a call with his schedule thereafter, but has not heard form radiation since then.Shane Pearson states his pain in his arms and legs is better since being on dexamethasone . His L arm has some tingling down to fingertips. He continues with some overall weakness and fatigue. Feels his appetite has improved a little since being on the steroids. Shane Pearson is asking about results from CARIS testing. Informed him Dr. Bronson may want to discuss at next follow up after radiation.Will update team.

## 2023-10-29 NOTE — Progress Notes (Signed)
 I reviewed the result of Shane Pearson report (dated 10/28/23), which showed stable microsatellite instability; 5 tumor mutational burden (low), and low genomic loss of heterozygosity.  There are two cancer-type relevant biomarkers: BAP1, Exon 13, p.R544fs and FBXW7, Exon 10 p.R505H.  Specimen requirements for MI cancer seek were not met and result not reported.Shane Pearson, MDProfessor of Medicine and Hydrographic surveyor of Solid Tumor Oncology ProgramJames P. Vancleave Cancer InstituteDivision of Hematology/OncologyDepartment of Medicine

## 2023-10-30 ENCOUNTER — Other Ambulatory Visit

## 2023-10-30 NOTE — Progress Notes (Signed)
 Assessment findings/ Changes from previous visit: Pt reports that 6-7 weeks ago he started to have a lot of pain in his thighs and groin area. Pt reports 4 weeks ago he went to [facility], he was having difficulty walking and was using one crutch. Prior to that he was not using anything. He reports he had to use a crutch to be able to walk with the pain he was experiencing. Pt reports he then was admitted to [facility] hospital and has been using a walker for 4 weeks. He reports that the pain is better but he feels weak, does not have the stamina and feels unsteady. Pt does have a stair lift to go down to lower level of home. Skilled Need/Focus of Care: Pt instructed in gait training to improve step height, endurance, posture and safety accessing environment. Ambulated 2 distances of 75 ft and 58 ft with walker and SBA. Progressed HEP with good teach back for patient and caregiver to perform safely. Pt educated on importance of walking every 1-2 hours during the day that he is awake for short distance with walker to increase endurance. Pt tolerated session well. Caregiver availability and willingness: spouse Discharge plan: 4 weeks to reach home PT goalsPlan for next visit: gait training, strengthening, endurance training, home safety education

## 2023-10-31 ENCOUNTER — Other Ambulatory Visit

## 2023-11-03 ENCOUNTER — Other Ambulatory Visit: Payer: Self-pay

## 2023-11-04 ENCOUNTER — Other Ambulatory Visit: Payer: Self-pay

## 2023-11-04 ENCOUNTER — Ambulatory Visit: Admitting: Radiation Oncology

## 2023-11-04 ENCOUNTER — Encounter: Admitting: Radiation Oncology

## 2023-11-04 DIAGNOSIS — C7951 Secondary malignant neoplasm of bone: Secondary | ICD-10-CM

## 2023-11-04 LAB — RAD ONC ARIA SESSION SUMMARY
Course Elapsed Days: 0
Course Fractions Treated: 1
Plan Fractions Treated to Date: 1
Plan Prescribed Dose Per Fraction: 400 cGY
Plan Total Prescribed Dose: 2000 cGy
Prescribed Total Fractions for Plan - Total: 5
Reference Point Dosage Given to Date: 400 cGy
Reference Point Dosage Given to Date: 404.39 cGy
Reference Point Session Dosage Given: 400 cGy
Reference Point Session Dosage Given: 404.39 cGy
Session Number: 1

## 2023-11-04 NOTE — Progress Notes (Signed)
 OTV Date: 8/25/25Pain: 0/10Energy Level: moderate fatigue GU: dislysis ptGI:appetite poor since steroids stopped Thoracic - dyspnea SkinHead &Neck: Systemic Therapy: noComments: Pt. BP low side , that is norm pr pt. , takes midodrine  tid .  HR tacchy , respirations labored , pt. States he was unable to tolerate full dialysis tx on sat . , to go tomorrow . Mild edema lower ext . , Pt. States he took his last steroid on Friday and can feel difference . Was going to reach out to med onc to discuss restarting . Encouraged pt. To seek urgent care or ED due to symptoms , refused . Wife with pt. Today at visit .

## 2023-11-04 NOTE — Progress Notes (Signed)
 Radiation Treatments   Active Plans C_Spine_eCmp Most recent treatment: Dose planned: 400 cGy (fraction 1 on 11/04/2023) Total: Dose planned: 2,000 cGy (5 fractions) Elapsed Days: 0  Reference Points [id number]_2000 Most recent treatment: Dose given: 400 cGy (on 11/04/2023) Total: Dose given: 400 cGy Elapsed Days: 0  Spine_Calc Most recent treatment: Dose given: 404 cGy (on 11/04/2023) Total: Dose given: 404 cGy Elapsed Days: 0     Performance Status: 2 - Symptomatic, <50% confined to bedChart Reviewed: yes              Films Reviewed: yesVitals: Vitals:  11/04/23 1457 BP: (!) 88/50 Pulse: 104 Temp: 36.6 C (97.9 F) S: [name] Kimpel's general condition has not changed, except that breathing is a little harderFatigue: 1Weakness: noAppetite: stable                          Nutrition: eats three meals a dayO. Wt:     Skin: Gr 0: no notable skin changes  Vitals:  11/04/23 1457 BP: (!) 88/50 Pulse: 104 Temp: 36.6 C (97.9 F) A/P: 1. RT: continue        2. Nutrition: continue current regimen Given his low blood pressure and difficult breathing, we advised him to go to emergency room for further evaluation, however the patient declined.  He states that he see nephrology tomorrow before his dialysis, so we will let the nephrology team to follow his condition

## 2023-11-05 ENCOUNTER — Telehealth: Payer: Self-pay | Admitting: Oncology

## 2023-11-05 ENCOUNTER — Ambulatory Visit

## 2023-11-05 ENCOUNTER — Inpatient Hospital Stay
Admission: EM | Admit: 2023-11-05 | Discharge: 2023-11-08 | DRG: 500 | Disposition: A | Discharge status: Home / Self Care | Source: Ambulatory Visit

## 2023-11-05 ENCOUNTER — Ambulatory Visit: Admitting: Oncology

## 2023-11-05 ENCOUNTER — Emergency Department

## 2023-11-05 ENCOUNTER — Other Ambulatory Visit: Payer: Self-pay

## 2023-11-05 ENCOUNTER — Encounter: Payer: Self-pay | Admitting: Oncology

## 2023-11-05 VITALS — BP 78/52 | HR 88 | Temp 98.2°F | Resp 22 | Wt 171.7 lb

## 2023-11-05 DIAGNOSIS — I959 Hypotension, unspecified: Secondary | ICD-10-CM | POA: Insufficient documentation

## 2023-11-05 DIAGNOSIS — I509 Heart failure, unspecified: Secondary | ICD-10-CM

## 2023-11-05 DIAGNOSIS — R531 Weakness: Principal | ICD-10-CM

## 2023-11-05 DIAGNOSIS — R918 Other nonspecific abnormal finding of lung field: Secondary | ICD-10-CM

## 2023-11-05 DIAGNOSIS — R9431 Abnormal electrocardiogram [ECG] [EKG]: Secondary | ICD-10-CM

## 2023-11-05 DIAGNOSIS — R29898 Other symptoms and signs involving the musculoskeletal system: Secondary | ICD-10-CM

## 2023-11-05 DIAGNOSIS — E785 Hyperlipidemia, unspecified: Secondary | ICD-10-CM | POA: Diagnosis present

## 2023-11-05 DIAGNOSIS — N250 Renal osteodystrophy: Secondary | ICD-10-CM | POA: Diagnosis present

## 2023-11-05 DIAGNOSIS — C801 Malignant (primary) neoplasm, unspecified: Secondary | ICD-10-CM | POA: Insufficient documentation

## 2023-11-05 DIAGNOSIS — Q613 Polycystic kidney, unspecified: Secondary | ICD-10-CM

## 2023-11-05 DIAGNOSIS — J9601 Acute respiratory failure with hypoxia: Secondary | ICD-10-CM | POA: Diagnosis present

## 2023-11-05 DIAGNOSIS — C7951 Secondary malignant neoplasm of bone: Principal | ICD-10-CM | POA: Diagnosis present

## 2023-11-05 DIAGNOSIS — J9811 Atelectasis: Secondary | ICD-10-CM

## 2023-11-05 DIAGNOSIS — R000 Tachycardia, unspecified: Secondary | ICD-10-CM

## 2023-11-05 DIAGNOSIS — K219 Gastro-esophageal reflux disease without esophagitis: Secondary | ICD-10-CM | POA: Diagnosis present

## 2023-11-05 DIAGNOSIS — G9520 Unspecified cord compression: Secondary | ICD-10-CM | POA: Diagnosis present

## 2023-11-05 DIAGNOSIS — R0602 Shortness of breath: Secondary | ICD-10-CM

## 2023-11-05 DIAGNOSIS — Z87891 Personal history of nicotine dependence: Secondary | ICD-10-CM

## 2023-11-05 DIAGNOSIS — C649 Malignant neoplasm of unspecified kidney, except renal pelvis: Secondary | ICD-10-CM | POA: Diagnosis present

## 2023-11-05 DIAGNOSIS — D631 Anemia in chronic kidney disease: Secondary | ICD-10-CM | POA: Diagnosis present

## 2023-11-05 DIAGNOSIS — C7931 Secondary malignant neoplasm of brain: Secondary | ICD-10-CM | POA: Diagnosis present

## 2023-11-05 DIAGNOSIS — Z992 Dependence on renal dialysis: Secondary | ICD-10-CM

## 2023-11-05 DIAGNOSIS — I5031 Acute diastolic (congestive) heart failure: Secondary | ICD-10-CM | POA: Diagnosis present

## 2023-11-05 DIAGNOSIS — N186 End stage renal disease: Secondary | ICD-10-CM | POA: Diagnosis present

## 2023-11-05 DIAGNOSIS — J811 Chronic pulmonary edema: Secondary | ICD-10-CM | POA: Diagnosis present

## 2023-11-05 DIAGNOSIS — J948 Other specified pleural conditions: Secondary | ICD-10-CM

## 2023-11-05 DIAGNOSIS — M51369 Other intervertebral disc degeneration, lumbar region without mention of lumbar back pain or lower extremity pain: Principal | ICD-10-CM

## 2023-11-05 LAB — BASIC METABOLIC PANEL
Anion Gap: 22 — ABNORMAL HIGH (ref 7–16)
CO2: 28 mmol/L (ref 20–28)
Calcium: 8.2 mg/dL — ABNORMAL LOW (ref 8.6–10.2)
Chloride: 90 mmol/L — ABNORMAL LOW (ref 96–108)
Creatinine: 6.63 mg/dL — ABNORMAL HIGH (ref 0.67–1.17)
Glucose: 74 mg/dL (ref 60–99)
Lab: 52 mg/dL — ABNORMAL HIGH (ref 6–20)
Potassium: 5.3 mmol/L — ABNORMAL HIGH (ref 3.3–5.1)
Sodium: 140 mmol/L (ref 133–145)
eGFR BY CREAT: 9 — AB

## 2023-11-05 LAB — VENOUS BLOOD GAS
Base Excess,VENOUS: 7 mmol/L — ABNORMAL HIGH (ref <=2)
Bicarbonate,VENOUS: 31 mmol/L — ABNORMAL HIGH (ref 21–28)
CO2 (Calc),VENOUS: 33 mmol/L — ABNORMAL HIGH (ref 22–31)
CO: 1.2 %
FO2 HB,VENOUS: 63 % (ref 63–83)
Hemoglobin: 15.6 g/dL (ref 12.0–17.0)
Methemoglobin: 1.3 % — ABNORMAL HIGH (ref 0.0–1.0)
PCO2,VENOUS: 43 mmHg (ref 40–50)
PH,VENOUS: 7.47 — ABNORMAL HIGH (ref 7.32–7.42)
PO2,VENOUS: 43 mmHg (ref 30–43)

## 2023-11-05 LAB — RAD ONC ARIA SESSION SUMMARY
Course Elapsed Days: 1
Course Fractions Treated: 2
Plan Fractions Treated to Date: 2
Plan Prescribed Dose Per Fraction: 400 cGY
Plan Total Prescribed Dose: 2000 cGy
Prescribed Total Fractions for Plan - Total: 5
Reference Point Dosage Given to Date: 800 cGy
Reference Point Dosage Given to Date: 808.79 cGy
Reference Point Session Dosage Given: 400 cGy
Reference Point Session Dosage Given: 404.39 cGy
Session Number: 2

## 2023-11-05 LAB — EKG 12-LEAD
P: 79 deg
PR: 121 ms
QRS: 33 deg
QRSD: 74 ms
QT: 322 ms
QTc: 441 ms
Rate: 113 {beats}/min
T: 73 deg

## 2023-11-05 LAB — HOLD GREEN NO GEL

## 2023-11-05 LAB — RUQ PANEL (ED ONLY)
ALT: 11 U/L (ref 0–50)
AST: 78 U/L — ABNORMAL HIGH (ref 0–50)
Albumin: 3.4 g/dL — ABNORMAL LOW (ref 3.5–5.2)
Alk Phos: 268 U/L — ABNORMAL HIGH (ref 40–130)
Amylase: 47 U/L (ref 28–100)
Bilirubin,Direct: <0.2 mg/dL (ref 0.0–0.3)
Bilirubin,Total: 0.4 mg/dL (ref 0.0–1.2)
Lipase: 17 U/L (ref 13–60)
Total Protein: 6.1 g/dL — ABNORMAL LOW (ref 6.3–7.7)

## 2023-11-05 LAB — MAGNESIUM: Magnesium: 1.7 mg/dL (ref 1.6–2.5)

## 2023-11-05 LAB — CBC AND DIFFERENTIAL
Baso # K/uL: 0 THOU/uL (ref 0.0–0.2)
Eos # K/uL: 0.3 THOU/uL (ref 0.0–0.5)
Hematocrit: 36 % — ABNORMAL LOW (ref 37–52)
Hemoglobin: 11 g/dL — ABNORMAL LOW (ref 12.0–17.0)
IMM Granulocytes #: 0.4 THOU/uL — ABNORMAL HIGH
IMM Granulocytes: 1.7 %
Lymph # K/uL: 1.4 THOU/uL (ref 1.0–5.0)
MCV: 89 fL (ref 75–100)
Mono # K/uL: 1.5 THOU/uL — ABNORMAL HIGH (ref 0.1–1.0)
Neut # K/uL: 18.8 THOU/uL — ABNORMAL HIGH (ref 1.5–6.5)
Nucl RBC # K/uL: 0.2 THOU/uL — ABNORMAL HIGH (ref 0.0–0.1)
Nucl RBC %: 0.8 /100{WBCs} — ABNORMAL HIGH (ref 0.0–0.2)
Platelets: 294 THOU/uL (ref 150–450)
RBC: 4.1 MIL/uL (ref 4.0–6.0)
RDW: 25.1 % — ABNORMAL HIGH (ref 0.0–15.0)
Seg Neut %: 83.6 %
WBC: 22.5 THOU/uL — ABNORMAL HIGH (ref 3.5–11.0)

## 2023-11-05 LAB — LACTATE, PLASMA: Lactate: 2.9 mmol/L — ABNORMAL HIGH (ref 0.5–2.2)

## 2023-11-05 LAB — HOLD BLUE

## 2023-11-05 LAB — PHOSPHORUS: Phosphorus: 4 mg/dL (ref 2.7–4.5)

## 2023-11-05 MED ORDER — MIDODRINE HCL 5 MG PO TABS *I*
10.0000 mg | ORAL_TABLET | Freq: Three times a day (TID) | ORAL | Status: DC
Start: 2023-11-05 — End: 2023-11-09
  Administered 2023-11-05 – 2023-11-08 (×9): 10 mg via ORAL
  Filled 2023-11-05 (×13): qty 2

## 2023-11-05 MED ORDER — SODIUM CHLORIDE 0.9 % FLUSH FOR PUMPS *I*
0.0000 mL/h | INTRAVENOUS | Status: DC | PRN
Start: 2023-11-05 — End: 2023-11-09

## 2023-11-05 MED ORDER — FLUDROCORTISONE ACETATE 100 MCG PO TABS *I*
0.1000 mg | ORAL_TABLET | Freq: Three times a day (TID) | ORAL | Status: DC
Start: 2023-11-05 — End: 2023-11-09
  Administered 2023-11-05 – 2023-11-08 (×9): 0.1 mg via ORAL
  Filled 2023-11-05 (×13): qty 1

## 2023-11-05 MED ORDER — LACTATED RINGERS IV BOLUS *I*
500.0000 mL | Freq: Once | INTRAVENOUS | Status: AC
Start: 2023-11-05 — End: 2023-11-06
  Administered 2023-11-05: 500 mL via INTRAVENOUS

## 2023-11-05 MED ORDER — ACETAMINOPHEN 500 MG PO TABS *I*
1000.0000 mg | ORAL_TABLET | Freq: Three times a day (TID) | ORAL | Status: DC | PRN
Start: 2023-11-05 — End: 2023-11-06
  Administered 2023-11-05: 1000 mg via ORAL
  Filled 2023-11-05: qty 2

## 2023-11-05 MED ORDER — CEFEPIME-DEXTROSE 1 GM/58ML IV SOLR *I*
1000.0000 mg | Freq: Once | INTRAVENOUS | Status: AC
Start: 2023-11-05 — End: 2023-11-05
  Administered 2023-11-05: 1000 mg via INTRAVENOUS
  Filled 2023-11-05: qty 58

## 2023-11-05 MED ORDER — DEXTROSE 5 % FLUSH FOR PUMPS *I*
0.0000 mL/h | INTRAVENOUS | Status: DC | PRN
Start: 2023-11-05 — End: 2023-11-09

## 2023-11-05 NOTE — Progress Notes (Signed)
 Writer called report to [facility] ED and transported patient via wheelchair to ED.

## 2023-11-05 NOTE — ED Triage Notes (Signed)
 See call in note, coming from [facility], today was second day of radiation therapy, coming in with worsening lower extremity weakness and dyspnea with exertion, hypotensive in triage, hx of metastatic renal cancer. Patient alert and oriented in triage. EKG in triage.   Prehospital medications given: No

## 2023-11-05 NOTE — ED Notes (Signed)
 11/05/23 1600 Expected Patient Date of notification 11/05/23 Time Notified 1600 Notified by Office/PCP ED Service Adult Call-in Pt Info note/Reason for sending Hx renal carcinoma. radiation started yesterday today with worsening bilateral lower extremity weakness and pain. concern for disease progression Expected Call-In Information BP (!) 78/52 Report called to call in note

## 2023-11-05 NOTE — Patient Instructions (Signed)
 Your Care Team          Phone:  [phone]      Fax:   [phone]        Physician:    Blaine Bump, MD      Nurse Practitioner: Bretta Camp, NP      Nurses:    Meriel Stank, RN     Marcelene Sep, RN      Secretary:    Lisa Rideau      Social Worker:   Marien Short, LMSW      Pharmacist:  Basilia Bosworth, PharmD    APPOINTMENT LOCATIONS:   [facility] - [address], Second Floor of Cancer Center  Tuesday's and Thursday's - [address], Lobby Floor of Cancer Center   Wednesday's- [address] (Dr. Cynthia Dries), [address] Bobbye Burrow, NP)    You can reach us  at [phone]. Please be prepared to let the secretary know why you are calling.  This will help let your nurse know how urgent the call is.   We will return your call as soon as possible, on clinic days it may take us  longer. After hours, weekends and holidays the phone is answered by the answering service. They will take a message and forward to the doctor on call who can access your chart and assist you. Please note that mychart messages are NOT monitored after business hours, on weekends or holidays. Please CALL for urgent matters.     Please call this same number if you need to cancel or re-schedule your appointment.  Please allow 5 days to re-fill any prescriptions and 5-10 days to complete disability or FMLA paperwork.

## 2023-11-05 NOTE — ED Provider Progress Notes (Signed)
 Transfer of Care Note I assumed care of this patient at signout from the evening service. Shane Pearson is a 63 y.o. 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 Pending [facility] admission for FTT, awaiting MICU consultation to clear for floor Most Recent Vitals:Vitals:  11/05/23 2125 BP: (!) 80/55 Pulse: 98 Resp: 22 Temp:   ED Course & Disposition:  Romie Barrack, MDResident08/26/25 2302

## 2023-11-05 NOTE — Provider Consult (Signed)
 MICU ConsultHPI: Shane Pearson is a 63 y.o. male with stage IV renal cell carcinoma and ESRD on HD presenting from his radiation oncology appointment with progressive DOE and weakness iso completing an empiric decadron  for risk reduction of perispinal bony metastases without evidence of cord compression.ED evaluation notable for hypotension to the 70s/50s, mild tachycardia, and neurotrophilic leukocytosis. CXR consistent with pulmonary edema and atalectasis without consolidation. AST predominant transaminase elevationAt bedside [name] is quite tired and feeling weak with diffuse pains for several days but feels he's been at this level for the past several days; denies fevers, chills, or recent sick contacts. He denies dysuria or frequency. Mild nausea but no worse than chronic for him, thinks this is mostly because he doesn't eat much.Objective:Vitals:  11/05/23 2300 BP: (!) 82/47 Pulse: 93 Resp: 23 Temp:  General: alert and oriented, pleasant and cooperative, appears stated ageHEENT: head normocephalic and atraumatic, EOMICardiovascular: regular rate (90s) and rhythm, no rubs, gallops, or murmurs; 1+ pitting edema bilaterallyPulmonary: No add'l WOB; mild bilateral cracklesAbdomen: non-protuberant, normoactive bowel sounds, soft and nontender to palpationSkin: no rashes or lesionsNeuro: Moving extremities spontaneously Psych: appropriate mood, full affectLines/tubes: R PIVAssessment: Shane Pearson is a 63 y.o. male with metastatic presumed RCC, ESRD on HD, and reported adrenal insufficiency with baseline SBP 70s-90s presenting with failure to thrive and evidence of mild acidemia and lactic acidosis pending additional fluid resuscitation. As these pressures are near his baseline and he is pending appropriate fluid resuscitation for mixed SIRS features and hypovolemia, he does not clearly have an ICU indication at this time.Recommended Plan:- Fluid resuscitate  gently; recommend add'l 500cc LR over 1-2 hr. Agree broad spectrum antibiotics- Recommend continue home midodrine - Unclear if he's been fully worked up for adrenal insufficiency (not on glucocorticoid, no cortisol on record); can consider further eval in addition to home florinefDisposition: Appropriate for the floorsPlan communicated with: bedside RN, primary team, MICU Julian Hoots, MD, PhD 11:54 PM 8/26/2025Internal Medicine PGY2

## 2023-11-05 NOTE — Telephone Encounter (Addendum)
 Returning call to [place]. He completed a course of steroids on Friday 8/22. Since being off of them, he has hadi Increased weakness and fatigue. The pain in his legs is more prominent and its difficult for him to walk. He also is eating a little less since completing steroids.  He started radiation yesterday. They recommended he go to ED due to tachycardia and tachypnea and above symptoms, but he declined.  [name] has his second radiation treatment this afternoon at 2:15pm. He will be seeing radiation oncologist on Monday 9/1. [name] was unable to complete dialysis on Saturday due to pain. Today they took off a little more fluid than normal at dialysis. He is wondering if he can restart steroids and when he can see Dr. Bronson. Requested that [name] discuss these symptoms during his radiation tx this afternoon.Offered 9/4 appt at [facility] but he has dialysis Tues, Thurs, Sat. Will discuss scheduling with team. Will inquire if he should be seen by med on NP tomorrow before dialysis.

## 2023-11-05 NOTE — Telephone Encounter (Signed)
 [name] agreeable to 3pm appt today with Jon NP to address symptoms

## 2023-11-05 NOTE — Progress Notes (Signed)
 Diagnosis: Metastatic carcinoma of renal originStage at Diagnosis: IV Oncologic History7/19/25: Presented to emergency room with generalized malaise, pain, weight loss and failure to thrive.09/28/23 to 10/10/23: Admitted to hospital for workup.  Imaging studies consistent of CT shoulder, cervical spine, head, chest, abdomen and pelvis (without contrast); MRI cervical, thoracic and lumbar spine, and X-ray of bilateral femurs, which showed no acute radiographic finding in left shoulder and head.  There is moderate right and small left layering pleural effusion, multiple enlarged mediastinal lymph nodes, right chest wall subcutaneous soft tissue density nodules, a 2.6 x 1.7 cm right adrenal lesion with indeterminate attenuation, polycystic kidney disease, retroperitoneal gastrohepatic, and periportal lymphadenopathy, intramuscular lesions throughout the pelvis and paraspinal musculature, and osseous metastases in cervical, thoracic and lumbar spine.   IR biopsy of L5 lesion showed metastatic carcinoma of renal origin. Cytology of pleural fluid (dated 10/01/23) showed malignant tumor cells derived from carcinoma, supporting epithelial differentiation.  Bone scan showed diffuse background osseous uptake related to patient's renal osteodystrophy, uptake within the proximal right humerus, and predominantly cortical uptake within the proximal bilateral femurs.  Brain MRI showed numerous multifocal areas of punctate enhancement scattered throughout the brain parenchyma, consistent with intracranial metastatic disease. There was no significant mass effect and no evidence of associated hemorrhagic changes. 11/05/2023: ED for failure to thrive, worsening weakness and pain History of Present IllnessGreg Pearson is a 63 y.o. man with polycystic kidney disease on hemodialysis, congestive heart failure, GERD, and hypercholesterolemia, who presents for follow up for newly diagnosed stage IV metastatic carcinoma of renal  origin.  His spouse, Shane Pearson, accompanied him throughout the visit.Shane Pearson presents today for an urgent visit for worsening weakness, pain, and shortness of breath. He reports that he stopped the steroids on Friday, 8/22 and his weakness and pain increased in the following days. Started radiation yesterday. Notes weakness and pain in bilateral legs. Also notes new weakness in the left arm. Feeling short of breath during our visit. Had dialysis today. BP is very low. Denies fevers, bowel changes, urinary changes.  VitalsVitals:  11/05/23 1500 11/05/23 1519 BP: (!) 70/44 (!) 78/52 BP Location: Right arm Left arm Patient Position:  Sitting Cuff Size:  adult Pulse: 88  Resp: 22  Temp: 36.8 C (98.2 F)  TempSrc: Temporal  SpO2: 100%  Weight: 77.9 kg (171 lb 11.8 oz)  Physical ExaminationECOG Performance Status: 2 General: Patient appears cachetic, sitting in rolling walker. Respiratory: Decreased breath sounds bilaterally, short of breath Cardiovascular: Hearts sounds are regularGastrointestinal: Abdomen is soft, no tenderness, no distension  Musculoskeletal: No lower extremity swelling. Neurological: Sensation is grossly intact in upper and lower extremities.  3/5 proximal and distal lower extremity strenth.  3/5 left upper extremity strength due to pain in left shoulder joint  Laboratory  Lab results: 08/08/250048 WBC 11.5* Hemoglobin 9.3* Hematocrit 32* RBC 3.7* Platelets 478*   Lab results: 08/08/250048 08/07/251644 Sodium 136 137 Potassium 4.2 4.7 Chloride 93* 90* CO2 28 29* UN 25* 20 Creatinine 4.87* 4.10* Glucose 95 101* Calcium  8.6 9.2 Total Protein  --  7.1 Albumin  --  3.6 ALT  --  10 AST  --  CANCELED Alk Phos  --  235* Bilirubin,Total  --  0.3 ImpressionStage IV metastatic carcinoma of renal origin. RecommendationGreg Pearson is a 63 y.o. man with polycystic kidney disease on hemodialysis,  congestive heart failure, GERD, and hypercholesterolemia, who presents for follow up of newly diagnosed stage IV metastatic carcinoma of renal origin.  Today, I again reviewed with Shane Pearson that  patients with autosomal dominant polycystic kidney disease may have higher risk of developing renal cell carcinoma.  In the case of Shane Pearson, Shane Pearson is uncertain if he has autosomal dominant polycystic kidney disease.  In the future, I recommend referring Shane Pearson to Shane Pearson for evaluation.Then, I reviewed his MRI brain (dated 10/09/23), which showed numerous multifocal areas of punctate enhancement scattered throughout the brain parenchyma, consistent with intracranial metastatic disease. There was no significant mass effect and no evidence of associated hemorrhagic changes.  His bone scan (dated 10/10/23) showed diffuse background osseous uptake related to patient's renal osteodystrophy, uptake within the proximal right humerus, and predominantly cortical uptake within the proximal bilateral femurs. For management of stage IV metastatic carcinoma of renal origin, I reviewed his clinical case at [facility] tumor board on 10/16/23.  According to the pathologist, Shane Pearson has poorly differentiated carcinoma likely of renal origin based on immuno-histochemical staining.  Caris report stated specimen requirements were not met and result not reported. See Dr. Ginger note from today for further detail. There is concern for disease progression and spinal cord compression. Urgent ED evaluation was recommended today for urgent imaging with MRI spine and symptom management. A biopsy of paraspinal muscular lesions may be helpful to send for pathology and caris to determine site of primary disease. He was transported by RN to ED. In regard to his left shoulder/arm pain with mild intermittent weakness, I am most concerned about the C7 lesion causing compression of nerve.  He was evaluated  by radiation oncology while admitted to hospital.For management of possible intracranial metastases, I reviewed his clinical case with Shane Pearson from radiation oncology, who recommended SRS versus surveillance with short interval scans in 6 to 8 weeks.  In regard to his hypotension, on review of his medical record, his blood pressures usually run in the range of 90s/50s-60s.  Shane Pearson states this his blood pressure tends to run low after hemodialysis, which he had this morning.Given his worsening weakness, pain, shortness of breath, and hypotension, we recommended ED for further evaluation. His prior imaging studies showed metastatic osseous lesions in the spine.  I explained to Shane Pearson Netter that I recommend urgent evaluation to rule out spinal cord compression. See Dr. Ginger note from today for further recommendations.Patient was discussed and evaluated with Dr. Bronson, attending Shane Jon Hudson, NP

## 2023-11-05 NOTE — ED Notes (Signed)
 Provider authorized additional dose of Florinef  and Protamine d/t pts hypotension

## 2023-11-05 NOTE — ED Procedure Documentation (Cosign Needed)
 Procedures IV Insertion ProviderPerformed by: Cloretta Osgood, MDAuthorized by: Tristan Evalene SQUIBB, MD  Consent:   Consent obtained:  Verbal  Consent given by:  Patient  Risks discussed:  Bleeding  Alternatives discussed:  No treatmentUniversal protocol:   Procedure explained and questions answered to patient or proxy's satisfaction: yes    Relevant documents present and verified: yes    Test results available and properly labeled: yes    Imaging studies available: yes    Required blood products, implants, devices, and special equipment available: yes    Site/side marked: no    Immediately prior to procedure, a time out was called: no    Patient identity confirmed:  Verbally with patient and provided demographic dataIndications:   Indications:  Unable to obtain IV accessPre-procedure details:   Skin preparation:  ChloraPrepAnesthesia (see MAR for exact dosages):   Anesthesia method:  NoneProcedure details:   Vein location:  Cephalic  Vein laterality:  RightPost-procedure details:   Patient tolerance of procedure:  Tolerated well, no immediate complicationsComments:    US  guided Osgood Cloretta, MD Cloretta Osgood, MDResident08/26/25 1822I was present and participated during the critical and key portions and was immediately available during the remainder of the procedure.Evalene SQUIBB Tristan, MD  Tristan Evalene SQUIBB, MD08/28/25 [phone]

## 2023-11-05 NOTE — Progress Notes (Signed)
 I evaluated and discussed Shane Pearson's clinical case with Shane Hudson, NP this afternoon.  His spouse, Shane Pearson, accompanied Shane Pearson throughout the entire visit.Since discharge from the hospital and after completion of his dexamethasone , Shane Pearson reports worsening bilateral lower extremity weakness and pain.  He can no longer walk or stand up for a period of time.  He denies any fever, chills, dyspnea or chest pain.  He was started on palliative radiation treatment to cervical spine by Dr. Arminda recently.Vitals:  11/05/23 1500 11/05/23 1519 BP: (!) 70/44 (!) 78/52 BP Location: Right arm Left arm Patient Position:  Sitting Cuff Size:  adult Pulse: 88  Resp: 22  Temp: 36.8 C (98.2 F)  TempSrc: Temporal  SpO2: 100%  Weight: 77.9 kg (171 lb 11.8 oz)  On examination today, Shane Pearson is in no acute distress, sitting in wheelchair.  Sensation of bilateral lower extremity is grossly intact.  However, he was unable to lift both lower extremities.In the clinic, I reviewed with Shane Pearson and Shane Pearson the images of his recent imaging studies.  I explained to them that Shane Pearson has metastatic carcinoma of likely renal origin in his spine, lymph nodes, pleural effusion, and brain.  His increased leg pain and bilateral lower extremity weakness is concerning for progressive disease in the spine and we need to rule out spinal cord compression.  In addition, this stage IV metastatic carcinoma is not curable and his life expectancy will be limited.  In addition, I reviewed the result of Shane Pearson Caris report (dated 10/28/23), which showed stable microsatellite instability; 5 tumor mutational burden (low), and low genomic loss of heterozygosity. There are two cancer-type relevant biomarkers: BAP1, Exon 13, p.R581fs and FBXW7, Exon 10 p.R505H. Specimen requirements for MI cancer seek were not met and result not reported.  Based on these clinical data, I explained to Shane Pearson that  I am still uncertain about primary site of this metastatic carcinoma.Today, I recommend the following:Urgent inpatient admission to rule out spinal cord compression.  I recommend urgent MRI entire spine.If possible, would recommend inpatient urgent biopsy of one of the paraspinal muscular lesions to send for pathology and Caris again to delineate primary disease site.  This is important to determine optimal systemic treatment for Shane Venora Shane Pearson and Shane Pearson agree with urgent admission today.Shane Pearson Bronson, MDProfessor of Medicine and Hydrographic surveyor of Solid Tumor Oncology ProgramJames P. Ravensworth Cancer InstituteDivision of Hematology/OncologyDepartment of Medicine

## 2023-11-05 NOTE — ED Provider Notes (Cosign Needed)
 History Chief Complaint Patient presents with  Extremity Weakness  Shortness of Breath 63 year old male history of ESRD on HD TTS, hypotension on midodrine  Florinef , CHF EF 60%, Stage 4 RCC lumbosacral spondylosis, polycystic kidney disease, hyperlipidemia, coming in with a concern for weakness.  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 Admitted 8/7-8/8 for weakness in lower extremities, multiple spiny bony metastases noted on MRI, no cauda equina or conus medullaris compression. He was discharged with a walker. He has had progressive weakness since discharge, he presented to his radiation therapy appointment with hypotension, SOB, and weakness which his Radiation oncologist advised him to go to the ED. He has been increasingly SOB since Saturday when he had a shorter dialysis appointment than usual. He endorses pain all over but mainly over his L mid shin. He has numbness and decreased sensation in both feet. He was doing well until his decadron  was completed.Medical/Surgical/Family History Past Medical History[1] Patient Active Problem List Diagnosis Code  ESRD (end stage renal disease) on dialysis N18.6, Z99.2  HLD (hyperlipidemia) E78.5  Hypotension I95.9  Ischemic colitis K55.9  Allergic rhinitis J30.9  Arteriosclerosis of artery of extremity I70.209  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  Embolism due to vascular prosthetic devices, implants and grafts, initial encounter T82.818A  Spondylosis of lumbosacral region without myelopathy or radiculopathy M47.817  Diarrhea R19.7  Failure to thrive in adult R62.7  History of temporal artery biopsy Z98.890  Leg weakness R29.898  Past Surgical History[2] Social History[3]  Review of Systems Constitutional:  Positive for appetite change and fatigue. Negative for chills  and fever. HENT:  Positive for tinnitus. Negative for ear discharge, rhinorrhea and sneezing.  Eyes:       Blurry vision x 2months Respiratory:  Positive for shortness of breath. Negative for cough.  Cardiovascular:  Negative for chest pain and palpitations. Gastrointestinal:  Negative for abdominal distention, diarrhea, nausea and vomiting. Neurological:  Positive for numbness. Physical Exam Triage VitalsTriage Start: Start, (11/05/23 1617)  First Recorded BP: (!) 78/57, Resp: 16, Temp: 35.9 C (96.6 F), Temp src: TEMPORAL Oxygen Therapy SpO2: 95 %, Oximetry Source: Lt Hand, O2 Device: None (Room air), Heart Rate: (!) 111, (11/05/23 1620) Heart Rate (via Pulse Ox): (!) 111, (11/05/23 1657).Physical ExamVitals and nursing note reviewed. HENT:    Head: Normocephalic and atraumatic. Eyes:    Extraocular Movements: Extraocular movements intact.    Pupils: Pupils are equal, round, and reactive to light. Cardiovascular:    Rate and Rhythm: Regular rhythm. Tachycardia present. Pulmonary:    Effort: Tachypnea present. No accessory muscle usage. Chest:    Chest wall: No tenderness. Abdominal:    Palpations: Abdomen is soft.    Tenderness: There is no abdominal tenderness. Musculoskeletal:    Right lower leg: Edema present.    Left lower leg: Edema present. Skin:   Capillary Refill: Capillary refill takes more than 3 seconds.    Comments: Cool feet b/l Neurological:    Mental Status: He is alert and oriented to person, place, and time.    Comments: Decreased sensation in B/L feet. No saddle anesthesia Medical Decision Making Assessment:  63 year old male history of ESRD on HD TTS, hypotension on midodrine  Florinef , Stage 4 RCC lumbosacral spondylosis, polycystic kidney disease, hyperlipidemia, coming in with a concern for weakness. Worsening weakness, consistent hypotension, and increased pain is concerning for disease  progression.Differential diagnosis:  Progression of diseaseElectrolyte abnormalitiesPulmonary edemaPlan:  Orders Placed This  Encounter    Blood culture    Blood culture    *Chest standard frontal and lateral views    CBC and differential    Basic metabolic panel    Hold blue    Magnesium     Phosphorus    RUQ panel (ED only)    Hold green no gel    Lactate, plasma    Lactate, plasma (CONDITIONAL)    Venous blood gas    Continuous telemetry, non-protocol    Continuous pulse oximetry    EKG 12 lead    Venipuncture provider    Insert peripheral IV    sodium chloride  0.9 % FLUSH REQUIRED IF PATIENT HAS IV    dextrose  5 % FLUSH REQUIRED IF PATIENT HAS IV    midodrine  (PROAMATINE ) tablet 10 mg    fludrocortisone  (FLORINEF ) tablet 0.1 mg    acetaminophen  (TYLENOL ) tablet 1,000 mg    ceFEPIme  (MAXIPIME ) 1,000 mg in dextrose  5% 58 mL IVPB    lactated ringers  bolus 500 mLED Course and Disposition:  ICU consulted for hypotension. Patient signed off to night team.ED Course as of 11/05/23 2243 Tue Nov 05, 2023 2036 Lactate, plasma (CONDITIONAL) Annia Barters, MDResident Attestation:Patient seen by me on 11/05/2023.I saw and evaluated the patient. I agree with the resident's/fellow's findings and plan of care as documented above.Author:  Evalene SHAUNNA Glee, MD Matteson, Jalen, MDResident08/26/25 2311 [1] Past Medical History:Diagnosis Date  Arthritis   CHF (congestive heart failure)   Colon polyp   Dialysis patient   ESRD (end stage renal disease)   GERD (gastroesophageal reflux disease)   Hypercholesterolemia   Hypotension   Ischemic colitis   Thyroid  disease   hypo [2] Past Surgical History:Procedure Laterality Date  DIALYSIS FISTULA CREATION Bilateral   4 fistulas have failed- ? d/t low BP and small veins  HERNIA REPAIR    PR LIGATION/BIOPSY TEMPORAL ARTERY Right  10/04/2023  Procedure: TEMPORAL ARTERY BIOPSY RIGHT EYE;  Surgeon: Lynn Ervin Edelman, MD;  [facility] MAIN [facility];  Service: Ophthalmology  Right inguinal hernia repair   [3] Social HistoryTobacco Use  Smoking status: Former   Packs/day: 0.30   Years: 15.00   Additional pack years: 0.00   Total pack years: 4.50   Types: Cigarettes   Quit date: 08/02/1992   Years since quitting: 31.2  Smokeless tobacco: Never Substance Use Topics  Alcohol use: No  Drug use: No  Glee Evalene SHAUNNA, MD08/28/25 1420

## 2023-11-05 NOTE — Telephone Encounter (Signed)
 Pt calling stated he left th e hospital with 15 days worth of steroids and since getting off of them the pts energy, appetite and stated he can barly walk. Also waiting for results. Needs to schedule an appt.

## 2023-11-05 NOTE — Telephone Encounter (Signed)
 Pt returning missed call. He is looking to get test results for genetic testing to go about next steps. Also states that the steroids he was on was masking his symptoms: low energy, loss of appetite, weakness. Asking for call back- Verified number.

## 2023-11-05 NOTE — First Provider Contact (Signed)
 ED First Provider Contact NoteInitial provider evaluation performed by ED First Provider Contact   Date/Time Event User Comments  11/05/23 1617 ED First Provider Contact Shane Pearson C Initial Face to Face Provider Contact  Vital signs reviewed.Assessment: 63 y/o M with PMHx of ESRD TTS (3.2L treatment today), HLD, metastatic renal carcinoma started radiation therapy yesterday.  Came to [facility] ED from [facility] clinic appointment due to worsening generalized weakness and SOB.  Finished steroid course on 8/22.  Denies fevers, chest pain. Orders placed:EKG, LABS, and XRAYS Patient requires further evaluation. [address] [place], [place], 11/05/2023, 4:17 PM Sandralee Bernardino Clap, PA08/26/25 [phone]

## 2023-11-05 NOTE — Telephone Encounter (Signed)
 Returning call to [place]. Had first radiation tx yesterday. He has radiation this afternoon through 8/29. Will need FUV scheduled with Dr. Bronson thereafter. [name] did not answer writers call, requested return call to discuss symptoms he was reporting.

## 2023-11-06 ENCOUNTER — Inpatient Hospital Stay

## 2023-11-06 ENCOUNTER — Encounter: Payer: Self-pay | Admitting: Hematology and Oncology

## 2023-11-06 ENCOUNTER — Other Ambulatory Visit

## 2023-11-06 ENCOUNTER — Ambulatory Visit: Admitting: Radiation Oncology

## 2023-11-06 ENCOUNTER — Encounter

## 2023-11-06 DIAGNOSIS — M79605 Pain in left leg: Secondary | ICD-10-CM

## 2023-11-06 DIAGNOSIS — R2242 Localized swelling, mass and lump, left lower limb: Secondary | ICD-10-CM

## 2023-11-06 DIAGNOSIS — R531 Weakness: Principal | ICD-10-CM

## 2023-11-06 LAB — T4, FREE: Free T4: 0.6 ng/dL — ABNORMAL LOW (ref 0.9–1.7)

## 2023-11-06 LAB — BASIC METABOLIC PANEL
Anion Gap: 20 — ABNORMAL HIGH (ref 7–16)
CO2: 27 mmol/L (ref 20–28)
Calcium: 8.5 mg/dL — ABNORMAL LOW (ref 8.6–10.2)
Chloride: 92 mmol/L — ABNORMAL LOW (ref 96–108)
Creatinine: 7.56 mg/dL — ABNORMAL HIGH (ref 0.67–1.17)
Glucose: 78 mg/dL (ref 60–99)
Lab: 63 mg/dL — ABNORMAL HIGH (ref 6–20)
Potassium: 5.5 mmol/L — ABNORMAL HIGH (ref 3.3–5.1)
Sodium: 139 mmol/L (ref 133–145)
eGFR BY CREAT: 7 — AB

## 2023-11-06 LAB — CK: CK: 80 U/L (ref 39–308)

## 2023-11-06 LAB — CBC
Hematocrit: 35 % — ABNORMAL LOW (ref 37–52)
Hemoglobin: 10.4 g/dL — ABNORMAL LOW (ref 12.0–17.0)
MCV: 90 fL (ref 75–100)
Platelets: 270 THOU/uL (ref 150–450)
RBC: 3.9 MIL/uL — ABNORMAL LOW (ref 4.0–6.0)
RDW: 24.5 % — ABNORMAL HIGH (ref 0.0–15.0)
WBC: 17.7 THOU/uL — ABNORMAL HIGH (ref 3.5–11.0)

## 2023-11-06 LAB — COVID/INFLUENZA A & B/RSV NAAT (PCR)
COVID-19 NAAT (PCR): NEGATIVE
Influenza A NAAT (PCR): NEGATIVE
Influenza B NAAT (PCR): NEGATIVE
RSV NAAT (PCR): NEGATIVE

## 2023-11-06 LAB — MRSA NAAT (PCR): MRSA NAAT (PCR): NEGATIVE

## 2023-11-06 LAB — LACTATE, PLASMA: Lactate: 2.3 mmol/L — ABNORMAL HIGH (ref 0.5–2.2)

## 2023-11-06 LAB — CORTISOL: CORTISOL,AM: 22.9 ug/dL — ABNORMAL HIGH (ref 6.0–18.4)

## 2023-11-06 LAB — TSH WITH REFLEX TO FT4: TSH: 7.09 u[IU]/mL — ABNORMAL HIGH (ref 0.27–4.20)

## 2023-11-06 MED ORDER — ENOXAPARIN SODIUM 30 MG/0.3ML IJ SOSY *I*
30.0000 mg | PREFILLED_SYRINGE | Freq: Every day | INTRAMUSCULAR | Status: DC
Start: 2023-11-06 — End: 2023-11-09
  Administered 2023-11-06 – 2023-11-07 (×2): 30 mg via SUBCUTANEOUS
  Filled 2023-11-06 (×2): qty 0.3

## 2023-11-06 MED ORDER — ATORVASTATIN CALCIUM 10 MG PO TABS *I*
20.0000 mg | ORAL_TABLET | Freq: Every day | ORAL | Status: DC
Start: 2023-11-06 — End: 2023-11-09
  Administered 2023-11-06 – 2023-11-07 (×2): 20 mg via ORAL
  Filled 2023-11-06 (×3): qty 2

## 2023-11-06 MED ORDER — HEPARIN SODIUM (PORCINE) 1000 UNIT/ML IJ SOLN *WRAPPED*
0.0000 mL | Status: DC | PRN
Start: 2023-11-06 — End: 2023-11-09
  Administered 2023-11-06 – 2023-11-07 (×2): 1600 [IU]
  Filled 2023-11-06 (×2): qty 10

## 2023-11-06 MED ORDER — HYDROMORPHONE HCL PF 1 MG/ML IJ SOLN *WRAPPED*
0.5000 mg | INTRAMUSCULAR | Status: AC | PRN
Start: 2023-11-06 — End: 2023-11-07
  Administered 2023-11-06 – 2023-11-07 (×2): 0.5 mg via INTRAVENOUS
  Filled 2023-11-06: qty 0.5

## 2023-11-06 MED ORDER — NEPHRO-VITE 0.8 MG PO TABS *I*
1.0000 | ORAL_TABLET | Freq: Every day | ORAL | Status: DC
Start: 2023-11-06 — End: 2023-11-09
  Administered 2023-11-06 – 2023-11-08 (×3): 1 via ORAL
  Filled 2023-11-06 (×4): qty 1

## 2023-11-06 MED ORDER — ONDANSETRON HCL 2 MG/ML IV SOLN *I*
4.0000 mg | Freq: Four times a day (QID) | INTRAMUSCULAR | Status: DC | PRN
Start: 2023-11-06 — End: 2023-11-09

## 2023-11-06 MED ORDER — FLUTICASONE PROPIONATE 50 MCG/ACT NA SUSP *I*
1.0000 | Freq: Every day | NASAL | Status: DC
Start: 2023-11-06 — End: 2023-11-09
  Administered 2023-11-08: 1 via NASAL
  Filled 2023-11-06 (×2): qty 16

## 2023-11-06 MED ORDER — SENNOSIDES 8.6 MG PO TABS *I*
1.0000 | ORAL_TABLET | Freq: Every evening | ORAL | Status: DC
Start: 2023-11-06 — End: 2023-11-09

## 2023-11-06 MED ORDER — POLYETHYLENE GLYCOL 3350 PO PACK 17 GM *I*
17.0000 g | PACK | Freq: Every day | ORAL | Status: DC | PRN
Start: 2023-11-06 — End: 2023-11-09

## 2023-11-06 MED ORDER — CINACALCET HCL 60 MG PO TABS *I*
60.0000 mg | ORAL_TABLET | Freq: Every evening | ORAL | Status: DC
Start: 2023-11-06 — End: 2023-11-09
  Administered 2023-11-06 – 2023-11-07 (×3): 60 mg via ORAL
  Filled 2023-11-06 (×6): qty 1

## 2023-11-06 MED ORDER — HEPARIN SODIUM (PORCINE) 1000 UNIT/ML IJ SOLN *WRAPPED*
0.0000 mL | Status: DC | PRN
Start: 2023-11-06 — End: 2023-11-09
  Administered 2023-11-06 – 2023-11-07 (×2): 1600 [IU]

## 2023-11-06 MED ORDER — CEFEPIME 2GM IN D5W 110 ML IVPB *I*
2000.0000 mg | INTRAVENOUS | Status: DC
Start: 2023-11-06 — End: 2023-11-06

## 2023-11-06 MED ORDER — PANTOPRAZOLE SODIUM 40 MG PO TBEC *I*
40.0000 mg | DELAYED_RELEASE_TABLET | Freq: Every morning | ORAL | Status: DC
Start: 2023-11-06 — End: 2023-11-09
  Administered 2023-11-06 – 2023-11-08 (×3): 40 mg via ORAL
  Filled 2023-11-06 (×4): qty 1

## 2023-11-06 MED ORDER — MELATONIN 3 MG PO TABS *I*
3.0000 mg | ORAL_TABLET | Freq: Every evening | ORAL | Status: DC | PRN
Start: 2023-11-06 — End: 2023-11-09
  Filled 2023-11-06: qty 1

## 2023-11-06 MED ORDER — ACETAMINOPHEN 325 MG PO TABS *I*
650.0000 mg | ORAL_TABLET | ORAL | Status: DC | PRN
Start: 2023-11-06 — End: 2023-11-09
  Administered 2023-11-07: 650 mg via ORAL
  Filled 2023-11-06: qty 2

## 2023-11-06 MED ORDER — CEFEPIME-DEXTROSE 1 GM/58ML IV SOLR *I*
1000.0000 mg | INTRAVENOUS | Status: DC
Start: 2023-11-06 — End: 2023-11-07
  Administered 2023-11-06: 1000 mg via INTRAVENOUS
  Filled 2023-11-06: qty 1

## 2023-11-06 MED ORDER — ASPIRIN 81 MG PO TBEC *I*
81.0000 mg | DELAYED_RELEASE_TABLET | Freq: Every day | ORAL | Status: DC
Start: 2023-11-06 — End: 2023-11-09
  Administered 2023-11-06 – 2023-11-08 (×3): 81 mg via ORAL
  Filled 2023-11-06 (×4): qty 1

## 2023-11-06 MED ORDER — CALCIUM CARBONATE-VITAMIN D 500-200 MG-UNIT PO TABS *WRAPPED*
1.0000 | ORAL_TABLET | Freq: Two times a day (BID) | ORAL | Status: DC
Start: 2023-11-06 — End: 2023-11-09
  Administered 2023-11-06 – 2023-11-08 (×4): 1 via ORAL
  Filled 2023-11-06 (×7): qty 1

## 2023-11-06 NOTE — ED Notes (Signed)
 Pt states he feels uncomfortable completing ordered ambulatory pulse oximetry at this time. Pt stood at the edge of the bed and stated that he felt weak and SOB. Pt states he has been using a wheelchair lately d/t these symptoms. Covering provider notified.

## 2023-11-06 NOTE — Progress Notes (Cosign Needed Addendum)
 The student was personally supervised by me during the patient examination. I personally saw and evaluated the patient, provided the medical decision-making, and reviewed and verified the key elements of the student documentation. I have edited the student's note and confirm the findings and plan of care as documented. Hematology Oncology Hospitalist Progress Note  LOS: 0 days  Full Code Significant Events/Subjective: Overnight H&P reviewed.  Briefly [name] is a 63 year old male with past medical history of 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  He was recently admitted from 8/7 to 8/8 at [facility] for progressive worsening weakness; MRI is without evidence of cord compression and was discharged on Decadron  4 mg twice daily.  Steroid course ended on Friday 8/22, has started to experience symptoms since then.  Has required more help from his family/been using his wheelchair due to shortness of breath and fatigue, and generalized weakness.  He does not make urine, but he denies any bowel incontinence.  Denies saddle anesthesia.  Has pain in his left leg from the knee down for the last 2 days. Upon talking with [name], he states that his shortness of breath has been even more bothersome than his lower extremity weakness.  He believes that he had a bad session of dialysis on Saturday, and was not able to remove much fluid.  He states that after this he was having a lot more shortness of breath as well as lower extremity edema.  He was able to lift his lower extremities today, and he reports that this was improved from yesterday.  He reports having paresthesias in his bilateral arms since yesterday.  At his baseline, he walks with a walker; since  Friday he has required more help from spouse has been using a wheelchair.  Additionally, he started having double vision about 4 to 5 weeks ago, has been seen by ophthalmology for this-he reports that the lens that they prescribed has helped with the double vision. Seen with team on rounds. Nephrology APP at bedside as well. Patient noted to have some shortness of breath. H&P reviewed from overnight. History and physical confirmed with patient and student note above. Review of Systems Constitutional:  Positive for malaise/fatigue and weight loss. Negative for fever. Eyes:  Positive for blurred vision and double vision. Respiratory:  Positive for shortness of breath. Negative for cough and hemoptysis.  Cardiovascular:  Positive for orthopnea and leg swelling. Negative for chest pain and palpitations. Neurological:  Positive for tingling, sensory change and weakness. Negative for dizziness, speech change and headaches. Objective: Physical ExamTemp:  [35.9 C (96.6 F)-36.8 C (98.2 F)] 36.3 C (97.3 F)Heart Rate:  [88-111] 90Resp:  [16-31] 26BP: (70-93)/(34-57) 82/5777.9 kg (171 lb 11.8 oz)Physical ExamConstitutional:     General: He is not in acute distress.   Appearance: He is ill-appearing. HENT:    Head: Normocephalic and atraumatic. Cardiovascular:    Rate and Rhythm: Normal rate and regular rhythm. Pulmonary:    Effort: Pulmonary effort is normal. No respiratory distress.    Comments: On 2L O2Abdominal:    General: Bowel sounds are normal.    Tenderness: There is no abdominal tenderness. Neurological:    Mental Status: He is alert and oriented to person, place, and time.    Cranial Nerves: No cranial nerve deficit.    Motor: Weakness (b/l  LE antigravity) present. Recent Lab Studies:Personally reviewed and notable qnm:[facility] Lab 08/26/251829 WBC 22.5* Hemoglobin 11.0* Hematocrit 36* Platelets 294   Recent Labs Lab 08/26/251829 Sodium 140 Potassium 5.3* Chloride 90* CO2 28 UN 52* Creatinine 6.63* Glucose 74 Calcium  8.2* Albumin 3.4* Phosphorus 4.0 No results for input(s): APTT, INR, PTT, PTI in the last 168 hours.Recent Imaging Studies:Personally reviewed and notable for:US  doppler vein LEFT lower extremityResult Date: 8/27/2025No evidence of deep venous thrombosis in the left lower extremity. Hypoechoic nodules/nodes visualized in the left thigh, in the context of known metastatic disease. END OF IMPRESSION EKG 12 leadResult Date: 8/26/2025Sinus tachycardia Low voltage, extremity leads*Chest standard frontal and lateral viewsResult Date: 8/26/2025Bilateral perihilar interstitial opacities consistent with mild pulmonary edema. Moderate left and trace right pleural fluid with bibasilar atelectasis. END OF IMPRESSION I have personally reviewed the images and the Resident's/Fellow's interpretation and agree with or edited the findings. EKG 12 lead (initial)Result Date: 8/8/2025Sinus rhythm Low voltage, extremity and precordial leads Prolonged QT intervalMR lumbar spine without and with contrastResult Date: 8/8/20251. Multiple foci of abnormal signal and contrast enhancement scattered throughout the visualized bone marrow of the spine and sacrum consistent with diffuse bony metastases, similar in appearance to the prior study. 2. Degenerative changes involving the lumbar spine without significant progression. 3. Multiple heterogeneous enhancing lesions scattered throughout the visualized posterior paraspinal musculature, similar to the prior study. 4. Evaluation of the included portions of the abdomen is limited. Heterogeneous polycystic kidneys again noted, partially and suboptimally visualized. Bilateral pleural effusions are partially visualized. Retroperitoneal lymphadenopathy again noted. 5.  No evidence of compression of the  conus medullaris or cauda equina. END OF IMPRESSION MR spine thoracic without and with contrastResult Date: 8/8/20251. The patient had significant difficulty remaining still during this examination the patient was unable to tolerate complete imaging. Precontrast sequencing is limited. Patient was unable to tolerate postcontrast imaging. The acquired images are limited  due to motion artifact. 2. Diminished signal involving the visualized bone marrow similar to prior study, consistent with sequela of renal osteodystrophy. 3. Multiple foci of abnormal T2 signal elevation involving the thoracic spine most notably at the T6, T10, T11, and T12 levels similar to the prior exam. The findings are consistent with bony metastases. 4. Enhancing lesions involving the paraspinal musculature described on the prior examination are suboptimally visualized on the current study due to motion artifact, limited sequencing, and lack of intravenous contrast but are grossly stable. 5. Degenerative changes involving the thoracic spine without significant progression. 6. No significant signal abnormalities identified within the thoracic spinal cord. No evidence of thoracic spinal cord compression 7. Bilateral pleural effusions. END OF IMPRESSION MR cervical spine without and with contrastResult Date: 8/8/2025Degenerative changes involving the cervical spine, as detailed in the Findings section of this report. 1. Diffusely diminished signal involving the visualized bone marrow consistent with sequela of chronic renal osteodystrophy. 2. Multiple enhancing lesion the cervical spine consistent with bony metastases most notably involving C2, C4, and C7 similar in appearance to the prior examination. As noted on the prior exam, there is an expansile component both anteriorly and posteriorly involving the C7 vertebral body contributing to mild narrowing of the thecal sac, similar to the prior study. As noted on the prior exam,  there is likely involvement of the left C6-7 and C7-T1 neural foramina similar to the prior exam suboptimally visualized due to motion artifact. 3. Multiple enhancing lesions involving the posterior paraspinal musculature, grossly stable. 4. Degenerative changes involving the cervical spine without significant  progression. 5. No significant signal abnormalities or zones of abnormal contrast enhancement identified in the cervical spinal cord. 6. Bilateral pleural effusions. 7. The patient had difficulty remaining still during this examination. Motion artifact limits assessment. END OF IMPRESSION NM bone scan whole body standardResult Date: 8/1/2025Diffuse background osseous uptake related to patient's renal osteodystrophy limits assessment for metastatic lesions, recommend referring to previous CT and MR imaging for better assessment. Uptake within the proximal right humerus could represent metastatic lesion or a component of patient's underlying metabolic disease. MR imaging of the right humerus could be obtained if this would change patient management or if clinically warranted. Predominantly cortical uptake within the proximal bilateral femurs could be related to underlying renal disease, superimposed recent injury, or hypertrophic osteoarthropathy in the setting of metastatic cancer. END REPORT I have personally reviewed the images and the Resident's/Fellow's interpretation and agree with or edited the findings. MR head without and with contrastResult Date: 7/31/2025Numerous multifocal areas of punctate enhancement scattered throughout the brain parenchyma. This is consistent with intracranial metastatic disease. No significant mass effect and no evidence of associated hemorrhagic changes. END OF IMPRESSION Current Inpatient Medications: aspirin   81 mg Oral Daily  atorvastatin   20 mg Oral Daily with dinner  B complex-vitamin C-folic acid   1 tablet Oral Daily  calcium -vitamin D   1  tablet Oral BID  cinacalcet   60 mg Oral QPM  fluticasone   1 spray Nasal Daily  pantoprazole   40 mg Oral QAM  senna  1 tablet Oral Nightly  ceFEPime  (MAXIPIME ) IV  1,000 mg Intravenous Q24H  enoxaparin   30 mg Subcutaneous Daily @ 2100  midodrine   10 mg Oral TID  fludrocortisone   0.1 mg Oral TID acetaminophen , melatonin, ondansetron , polyethylene glycol, sodium chloride , dextroseAssessment: Shane Pearson is a 63 year old male with past medical history of 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  His weakness appears to be better than what was documented during clinic yesterday with Dr. Bronson, however with his history of intracranial and spinal mets, we will still pursue MRI of his spine to look for any cord compression.  Reassuring, he does not exhibit any saddle anesthesia, bowel incontinence that would be concerning for cauda equina syndrome. He will also get radiation treatment today to cervical spine. He will consult ortho spine after imaging if necessary.  He reports that his shortness of breath is more concerning to him right now, and he is currently on 2 L of oxygen (not on oxygen at home).  The most likely etiology of this is the pulmonary edema that was visualized on CXR, likely in the setting of CHF.  Infectious etiologies cannot be ruled out, given his white blood cell count of 22.5 on admission that is now improving.  He received 500 mL of fluid, but given the concern for CHF, he will be undergoing a short HD session for fluid removal; target around 2 L.  He will undergo hemodialysis as planned tomorrow. We will also get an inpatient biopsy of one of the paraspinal muscular lesions. Plans: ONC: Stage IV carcinoma, likely renal cell-  Metastasis to brain and bone- Followed outpatient by Dr. Bronson, - Last treated with 2 of 5 fractions of radiation to the cervical spine, can call rad onc in the AM- Dr. Bronson would also recommend urgent inpatient biopsy of one of  the paraspinal muscular lesion Worsening lower extremity weakness-MRI spine imaging on 8/7 showing C2, C4 and C7 bony mets with mild narrowing of the thecal sac unchanged from prior.  Likely mild C6-7 and C7-T1 L neural foramina narrowing (which is used to explain left arm numbness and pain).  Unchanged multiple posterior paraspinal musculature lesions. T6, T10-12 bony metastasis.  Diffuse bony mets of the L-spine and sacrum.  No evidence of cord compression.- Will repeat MRI spine imaging with and without contrast(contrast is ok to use as he is anuric)-Last physical therapy note on 8/20 notes that patient was able to ambulate 75 and 58 feet with a walker.-B12 normal on 8/7, iron panel without deficiency- TSH 13.1 with mildly low free T4 @0 .6 from 8/7- Dr. Bronson concerned for cauda equina, will order MRI spine stat, poor candidate for surgery given extent of mets, will consult ortho spine after imaging if needed- No evidence of DVT in LLE  Acute hypoxic respiratory failure with hypotension most likely in setting of CHF; c/f sepsis low for now -Baseline BPs 80s to 100 SBP, on admission 70/44 -had not received midodrine  and Florinef  yesterday- Is not on baseline oxygen, on 2 L NC with SpO2 93%-BCx in process, UA if abl to produce but pt states he is anuric- COVID/Flu/RSV negative -WBC 17.7(22.5)(11.5 on 8/8), lactate 2.9 - Received bolus after -CXR showing mild pulmonary edema, moderate left and trace right pleural fluid- Will undergo short hemodialysis session for fluid removal (goal 2 L) today- Will resume HD tomorrow as scheduled on TTS schedule- Has received 1 dose of cefepime  empirically yesterday- Cont home Midodrine  10mg  TID &  Florinef  TID -give before dialysis session- We will consider updating echocardiogram(last echo in 2019 with preserved EF of 65%), grade 2 diastolic dysfunction - Aspirin  81mg  - Lipitor 20mg   Chronic Medical Problems:End-stage renal disease on HD TTS-Nephrology consulted for HD-Short UF session today; regularly scheduled HD tomorrow-Nephro-Vite-Cinacalcet  GERD- Pantoprazole  40mg  daily FEN: Regular diet DVT PPx: Prophylactic Lovenox  Code Status: Full CodeDischarge Planning: (EDD):  tbdDischarge Criteria/Barriers to Discharge: pending workupPT and/or OT Recommendations/Discharge to: pending Appointments Needed with: Oncology Case discussed with Dr. Olivia, nephrology, radaition Dannial Sink on 11/06/2023 at [facility] Internal Medicine HospitalistCase discussed with MS4, nephrologyI personally spent >75 minutes on the calendar day of the encounter, including pre and post visit work,

## 2023-11-06 NOTE — Progress Notes (Signed)
 Writer reviewed chart. Pt is currently active with [facility] and receives PT, OT services. [facility] will continue to follow hospital course and arrange appropriate services at time of discharge. Please submit the Ref 34 order upon discharge so we can process services for home care. Thank you Olam LITTIE Hamilton RN [facility] [phone] [facility] After hours and on weekends/Holidays [phone]

## 2023-11-06 NOTE — Progress Notes (Incomplete Revision)
 The student was personally supervised by me during the patient examination. I personally saw and evaluated the patient, provided the medical decision-making, and reviewed and verified the key elements of the student documentation. Outlined below are additions and/or clarifications to the student's note: see my additions in blueHematology Oncology Hospitalist Progress Note  LOS: 0 days  Full Code Significant Events/Subjective: Overnight H&P reviewed.  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  He was recently admitted from 8/7 to 8/8 at [facility] for progressive worsening weakness; MRI is without evidence of cord compression and was discharged on Decadron  4 mg twice daily.  Steroid course ended on Friday 8/22, has started to experience symptoms since then.  Has required more help from his family/been using his wheelchair due to shortness of breath and fatigue, and generalized weakness.  He does not make urine, but he denies any bowel incontinence.  Denies saddle anesthesia.  Has pain in his left leg from the knee down for the last 2 days. Upon talking with [name], he states that his shortness of breath has been even more bothersome than his lower extremity weakness.  He believes that he had a bad session of dialysis on Saturday, and was not able to remove much fluid.  He states that after this he was having a lot more shortness of breath as well as lower extremity edema.  He was able to lift his lower extremities today, and he reports that this was improved from yesterday.  He reports having paresthesias in his bilateral arms since yesterday.  At his baseline, he walks with a walker;  since Friday he has required more help from spouse has been using a wheelchair.  Additionally, he started having double vision about 4 to 5 weeks ago, has been seen by ophthalmology for this-he reports that the lens that they prescribed has helped with the double vision. Seen with team on rounds. Nephrology APP at bedside as well. Patient noted to have some shortness of breath. Review of Systems Constitutional:  Positive for malaise/fatigue and weight loss. Negative for fever. Eyes:  Positive for blurred vision and double vision. Respiratory:  Positive for shortness of breath. Negative for cough and hemoptysis.  Cardiovascular:  Positive for orthopnea and leg swelling. Negative for chest pain and palpitations. Neurological:  Positive for tingling, sensory change and weakness. Negative for dizziness, speech change and headaches. Objective: Physical ExamTemp:  [35.9 C (96.6 F)-36.8 C (98.2 F)] 36.3 C (97.3 F)Heart Rate:  [88-111] 90Resp:  [16-31] 26BP: (70-93)/(34-57) 82/5777.9 kg (171 lb 11.8 oz)Physical ExamConstitutional:     General: He is not in acute distress.   Appearance: He is ill-appearing. HENT:    Head: Normocephalic and atraumatic. Cardiovascular:    Rate and Rhythm: Normal rate and regular rhythm. Pulmonary:    Effort: Pulmonary effort is normal. No respiratory distress.    Comments: On 2L O2Abdominal:    General: Bowel sounds are normal.    Tenderness: There is no abdominal tenderness. Neurological:    Mental Status: He is alert and oriented to person, place, and time.    Cranial Nerves: No cranial nerve deficit.    Motor: Weakness (b/l LE antigravity) present. Recent Lab Studies:Personally reviewed and notable qnm:[facility] Lab 08/26/251829 WBC 22.5* Hemoglobin  11.0* Hematocrit 36* Platelets 294  Recent Labs Lab 08/26/251829 Sodium 140 Potassium 5.3* Chloride 90* CO2 28 UN  52* Creatinine 6.63* Glucose 74 Calcium  8.2* Albumin 3.4* Phosphorus 4.0 No results for input(s): APTT, INR, PTT, PTI in the last 168 hours.Recent Imaging Studies:Personally reviewed and notable for:US  doppler vein LEFT lower extremityResult Date: 8/27/2025No evidence of deep venous thrombosis in the left lower extremity. Hypoechoic nodules/nodes visualized in the left thigh, in the context of known metastatic disease. END OF IMPRESSION EKG 12 leadResult Date: 8/26/2025Sinus tachycardia Low voltage, extremity leads*Chest standard frontal and lateral viewsResult Date: 8/26/2025Bilateral perihilar interstitial opacities consistent with mild pulmonary edema. Moderate left and trace right pleural fluid with bibasilar atelectasis. END OF IMPRESSION I have personally reviewed the images and the Resident's/Fellow's interpretation and agree with or edited the findings. EKG 12 lead (initial)Result Date: 8/8/2025Sinus rhythm Low voltage, extremity and precordial leads Prolonged QT intervalMR lumbar spine without and with contrastResult Date: 8/8/20251. Multiple foci of abnormal signal and contrast enhancement scattered throughout the visualized bone marrow of the spine and sacrum consistent with diffuse bony metastases, similar in appearance to the prior study. 2. Degenerative changes involving the lumbar spine without significant progression. 3. Multiple heterogeneous enhancing lesions scattered throughout the visualized posterior paraspinal musculature, similar to the prior study. 4. Evaluation of the included portions of the abdomen is limited. Heterogeneous polycystic kidneys again noted, partially and suboptimally visualized. Bilateral pleural effusions are partially visualized. Retroperitoneal lymphadenopathy again noted. 5.  No evidence of compression of the conus medullaris or cauda equina. END OF IMPRESSION MR spine thoracic without and with  contrastResult Date: 8/8/20251. The patient had significant difficulty remaining still during this examination the patient was unable to tolerate complete imaging. Precontrast sequencing is limited. Patient was unable to tolerate postcontrast imaging. The acquired images are limited  due to motion artifact. 2. Diminished signal involving the visualized bone marrow similar to prior study, consistent with sequela of renal osteodystrophy. 3. Multiple foci of abnormal T2 signal elevation involving the thoracic spine most notably at the T6, T10, T11, and T12 levels similar to the prior exam. The findings are consistent with bony metastases. 4. Enhancing lesions involving the paraspinal musculature described on the prior examination are suboptimally visualized on the current study due to motion artifact, limited sequencing, and lack of intravenous contrast but are grossly stable. 5. Degenerative changes involving the thoracic spine without significant progression. 6. No significant signal abnormalities identified within the thoracic spinal cord. No evidence of thoracic spinal cord compression 7. Bilateral pleural effusions. END OF IMPRESSION MR cervical spine without and with contrastResult Date: 8/8/2025Degenerative changes involving the cervical spine, as detailed in the Findings section of this report. 1. Diffusely diminished signal involving the visualized bone marrow consistent with sequela of chronic renal osteodystrophy. 2. Multiple enhancing lesion the cervical spine consistent with bony metastases most notably involving C2, C4, and C7 similar in appearance to the prior examination. As noted on the prior exam, there is an expansile component both anteriorly and posteriorly involving the C7 vertebral body contributing to mild narrowing of the thecal sac, similar to the prior study. As noted on the prior exam, there is likely involvement of the left C6-7 and C7-T1 neural foramina similar to the prior  exam suboptimally visualized due to motion artifact. 3. Multiple enhancing lesions involving the posterior paraspinal musculature, grossly stable. 4. Degenerative changes involving the cervical spine without significant progression. 5. No significant signal abnormalities or zones of abnormal contrast enhancement identified in the cervical  spinal cord. 6. Bilateral pleural effusions. 7. The patient had difficulty remaining still during this examination. Motion artifact limits assessment. END OF IMPRESSION NM bone scan whole body standardResult Date: 8/1/2025Diffuse background osseous uptake related to patient's renal osteodystrophy limits assessment for metastatic lesions, recommend referring to previous CT and MR imaging for better assessment. Uptake within the proximal right humerus could represent metastatic lesion or a component of patient's underlying metabolic disease. MR imaging of the right humerus could be obtained if this would change patient management or if clinically warranted. Predominantly cortical uptake within the proximal bilateral femurs could be related to underlying renal disease, superimposed recent injury, or hypertrophic osteoarthropathy in the setting of metastatic cancer. END REPORT I have personally reviewed the images and the Resident's/Fellow's interpretation and agree with or edited the findings. MR head without and with contrastResult Date: 7/31/2025Numerous multifocal areas of punctate enhancement scattered throughout the brain parenchyma. This is consistent with intracranial metastatic disease. No significant mass effect and no evidence of associated hemorrhagic changes. END OF IMPRESSION Current Inpatient Medications: aspirin   81 mg Oral Daily  atorvastatin   20 mg Oral Daily with dinner  B complex-vitamin C-folic acid   1 tablet Oral Daily  calcium -vitamin D   1 tablet Oral BID  cinacalcet   60 mg Oral QPM  fluticasone   1 spray Nasal Daily   pantoprazole   40 mg Oral QAM  senna  1 tablet Oral Nightly  ceFEPime  (MAXIPIME ) IV  1,000 mg Intravenous Q24H  enoxaparin   30 mg Subcutaneous Daily @ 2100  midodrine   10 mg Oral TID  fludrocortisone   0.1 mg Oral TID acetaminophen , melatonin, ondansetron , polyethylene glycol, sodium chloride , dextroseAssessment: Shane Pearson is a 64 year old male with past medical history of 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  His weakness appears to be better than what was documented during clinic yesterday with Dr. Bronson, however with his history of intracranial and spinal mets, we will still pursue MRI of his spine to look for any cord compression.  Reassuring, he does not exhibit any saddle anesthesia, bowel incontinence that would be concerning for cauda equina syndrome. He will also get radiation treatment today to cervical spine. He will consult ortho spine after imaging if necessary.  He reports that his shortness of breath is more concerning to him right now, and he is currently on 2 L of oxygen (not on oxygen at home).  The most likely etiology of this is the pulmonary edema that was visualized on CXR, likely in the setting of CHF.  Infectious etiologies cannot be ruled out, given his white blood cell count of 22.5 on admission that is now improving.  He received 500 mL of fluid, but given the concern for CHF, he will be undergoing a short HD session for fluid removal; target around 2 L.  He will undergo hemodialysis as planned tomorrow. We will also get an inpatient biopsy of one of the paraspinal muscular lesions. Plans: ONC: Stage IV carcinoma, likely renal cell- Metastasis to brain and bone- Followed outpatient by Dr. Bronson, - Last treated with 2 of  5 fractions of radiation to the cervical spine, can call rad onc in the AM- Dr. Bronson would also recommend urgent inpatient biopsy of one of the paraspinal muscular lesion Worsening lower extremity weakness-MRI spine imaging on 8/7 showing C2, C4 and  C7 bony mets with mild narrowing of the thecal sac unchanged from prior.  Likely mild C6-7 and C7-T1 L neural foramina narrowing (which is used to explain left arm numbness and pain).  Unchanged multiple posterior paraspinal musculature lesions. T6, T10-12 bony metastasis.  Diffuse bony mets of the L-spine and sacrum.  No evidence of cord compression.- Will repeat MRI spine imaging with and without contrast(contrast is ok to use as he is anuric)-Last physical therapy note on 8/20 notes that patient was able to ambulate 75 and 58 feet with a walker.-B12 normal on 8/7, iron panel without deficiency- TSH 13.1 with mildly low free T4 @0 .6 from 8/7- Dr. Bronson concerned for cauda equina, will order MRI spine stat, poor candidate for surgery given extent of mets, will consult ortho spine after imaging if needed- No evidence of DVT in LLE  Acute hypoxic respiratory failure with hypotension most likely in setting of CHF; c/f sepsis low for now -Baseline BPs 80s to 100 SBP, on admission 70/44 -had not received midodrine  and Florinef  yesterday- Is not on baseline oxygen, on 2 L NC with SpO2 93%-BCx in process, UA if abl to produce but pt states he is anuric- COVID/Flu/RSV negative -WBC 17.7(22.5)(11.5 on 8/8), lactate 2.9 - Received bolus after -CXR showing mild pulmonary edema, moderate left and trace right pleural fluid- Will undergo short hemodialysis session for fluid removal (goal 2 L) today- Will resume HD tomorrow as scheduled on TTS schedule- Has received 1 dose of cefepime  empirically yesterday- Cont home Midodrine  10mg  TID & Florinef  TID -give before dialysis session- We will consider updating echocardiogram(last echo  in 2019 with preserved EF of 65%), grade 2 diastolic dysfunction - Aspirin  81mg  - Lipitor 20mg   Chronic Medical Problems:End-stage renal disease on HD TTS-Nephrology consulted for HD-Short UF session today; regularly scheduled HD tomorrow-Nephro-Vite-Cinacalcet  GERD- Pantoprazole  40mg  daily FEN: Regular diet DVT PPx: Prophylactic Lovenox  Code Status: Full CodeDischarge Planning: (EDD):  tbdDischarge Criteria/Barriers to Discharge: pending workupPT and/or OT Recommendations/Discharge to: pending Appointments Needed with: Oncology Case discussed with Dr. Olivia, nephrology, radaition Dannial Sink on 11/06/2023 at [facility] [place] [facility] Internal Medicine HospitalistUS News Diagnoses

## 2023-11-06 NOTE — ED Notes (Signed)
 Report Given ToCory, RNDescriptive Sentence / Reason for Admission Hx renal carcinoma. radiation started yesterday today with worsening bilateral lower extremity weakness and pain. concern for disease progressionActive Issues / Relevant Events Prior fullA/O x4Hypotension is BL (80s)2L NC-> RA BLCXR: mild pulmonary edema L limb alertDoesn't make urine WBC 22.5To Do ListMeds marVS/A Q42nd set bc?Neph consult MRIAnticipatory Guidance / Discharge PlanningAdmit for weakness

## 2023-11-06 NOTE — Progress Notes (Signed)
 11/06/23 1143 [facility] Patient Class Review Patient Class Review Inpatient Patient class effective as of 8/27/25James EastUtilization Management

## 2023-11-06 NOTE — ED Notes (Signed)
 Florinef  and Midodrine  given before dialysis per MD request.

## 2023-11-06 NOTE — Progress Notes (Signed)
 Physical Therapy Initial EvaluationTherapy Recommendations:Discharge Recommendations:  Discharge to Planned Living Arrangement:    Patient's mobility is currently a barrier to discharge. 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  His weakness appears to be better than what was documented during clinic yesterday with Dr. Bronson, however with his history of intracranial and spinal mets, we will still pursue MRI of his spine to look for any cord compression.  Reassuring, he does not exhibit any saddle anesthesia, bowel incontinence that would be concerning for cauda equina syndrome. He will also get radiation treatment today to cervical spine. He will consult ortho spine after imaging if necessary.  He reports that his shortness of breath is more concerning to him right now, and he is currently on 2 L of oxygen (not on oxygen at home).  The most likely etiology of this is the pulmonary edema that was visualized on CXR, likely in the setting of CHF.  Infectious etiologies cannot be ruled out, given his white blood cell count of 22.5 on admission that is now improving.  He received 500 mL of fluid, but given the concern for [facility] Past Medical History[1] Past Surgical History: Procedure Laterality Date  DIALYSIS FISTULA CREATION Bilateral   4 fistulas have  failed- ? d/t low BP and small veins  HERNIA REPAIR    PR LIGATION/BIOPSY TEMPORAL ARTERY Right 10/04/2023  Procedure: TEMPORAL ARTERY BIOPSY RIGHT EYE;  Surgeon: Lynn Ervin Edelman, MD;  Location: [facility] MAIN OR;  Service: Ophthalmology  Right inguinal hernia repair   Personal factors affecting treatment/recovery:Needs assistance with prior ADLs and mobilityRecent hospital admission (within last 6 months)Comorbidities affecting treatment/recovery:ArthritisCancer (chemotherapy/radiation therapy)Cardiac historyEnd stage renal disease on dialysis (ESRD)Clinical presentation:stablePatient complexity:  low level as indicated by above stability of condition, personal factors, environmental factors and comorbidities in addition to their impairments found on physical exam. 11/06/23 0922 Prior Living  Prior Living Situation Reported by patient Lives With Spouse;Family Type of Home [place] # Steps to Enter Home 2 # Rails to Enter Home 1 # Of Steps In Home 12(has stair glide to basement) # Rails in Home 1 Location of Bedrooms 1st floor Location of Bathrooms 1st floor Bathroom Accessibility Walk-in shower Current Home Equipment Adjustable bed;Grab bars in shower/tub;Shower chair;Crutches;Walker (rolling);Walker (rollator) Prior Function Level Prior Function Level Reported by patient Transfers Independent Transfer Devices rolling walker Walking Independent;Used assistive device;Household distances only Walking assistive devices used Tax inspector Independent History of Falls No Receives Help From Family Type of Help Received PTA Wife has been assisting with dressing, sponge bathing, driving. Additional Comments Pt reports he is typically independent with RW. However, for the week leading to admission d/t weakness his wife has been pushing him on rollator. PT Tracking PT TRACKING PT Assigned Visit Number  Visit Number [facility]) / Treatment Day [facility]) 1 Visit Details [facility]) Visit Type [facility]) Evaluation Precautions/Observations Precautions used Yes Fall Precautions General falls precautions Activity Orders Present Yes LDA Observation Monitors;O2 (comment)(2L O2; telemetry) Vital Signs Response with Therapy Resting supine:  96% O2, 95HR, 77/55. Seated: 34RR, 93/57, 98HR, and 94% O2. Seated: 96/68. Was patient wearing a mask? No PPE worn by Clinical research associate [facility] Patient Subjective Endorses SOB with standing. Other Recedived and left lying in stretcher. Per RN, ok to mobilize. Current Pain Assessment Pain Assessment / Reassessment Assessment Pain Scale 0-10 (Numeric Scale for Pain Intensity) 0-10  Pain Scale 0 Pain Comments Denies pain resting. Vision  Current Vision Visual deficits Additional Comments Pt reports visual deficits. Please refer to OT note for further info. Futures trader Style Verbal Cognition Cognition Tested Arousal/Alertness Appropriate responses to stimuli Orientation A&Ox4 Ability to Follow Instructions Follows all commands and directions without difficulty Type of Instructions Given Verbal Additional Comments Pleasant and cooperative with PT. LE Assessment Assessment Focus Strength LLE Strength Overall Strength WFL assessed within functional activities Additional Comments WFL except hip flexion 2+/5 RLE Strength Overall Strength WFL assessed within functional activities Additional Comments WFL except hip flexion 2+/5 Bed Mobility Bed mobility Tested Supine to Sit Modified independent (device);Side rails up (#);Head of bed elevated Sit to Supine Modified independent (device);Head of bed elevated;Side rails up (#) Additional comments Pt completes with the HOB elevated and use of stretcher rails. No physical assist required. Transfers Transfers Tested Archivist guard;1 person Sit to Stand Contact guard;1 person assist Stand to sit Contact guard;1 person assist Transfer Assistive Device rolling walker;hand held assist Additional comments CGA x1 seated <> standing at RW. Performs 1x stand from stretcher surface and 4x stand pivot to/from w/c surface. CGA via HHA Mobility Mobility: Gait/Stairs Not tested (comment) Naval architect mobility Not tested (comment) Additional comments Therapist pushing pt to w/c. Training and Education Patient Role of acute PT and current recommendations for home d/c +/- w/c. Balance Balance Tested Sitting - Static Independent Sitting - Dynamic Independent Standing - Static Contact guard;Supported Standing - Teaching laboratory technician guard;Supported Additional comments UE support via HHA and w/c arm rest. PT AM-PAC Mobility Turning over in bed? 4 Moving from lying on back to sitting on the side of the bed? 4 Moving to and from a bed to a chair? 3 Sitting down on and standing up from a chair with arms? 3 Need to walk in hospital room? 1 Climbing 3 - 5 steps with a railing? 1 Total Raw Score 16 AM-PAC T-Scale Score 38.32 Assessment Brief Assessment Appropriate for skilled therapy Problem List Impaired functional mobility;Impaired bed mobility;Impaired stair navigation;Impaired transfers;Impaired ambulation;Impaired balance;Impaired endurance Patient / Family Goal Not stated Overall Assessment Pt is functioning below his baseline mobility. Limited on evaluation 2/2 SOB and hypotension/dizziness. Anticipate with medical management and additional PT visits, pt will progress to return to previous living environment. Potentially w/c level pending pt's tolerance to mobility. Plan/Recommendation PT Treatment Interventions Assess functional mobility;Bed mobility training;Transfers training;Balance training;Pt/Family education;Strengthening;Gait training;Family Training;Home  exercise program instruction;D/C planning;Will work to minimize pain while promoting mobility whenever possible PT Frequency 3-5 x/wk PT Positioning Recommendations OOBTC daily; meals upright PT Mobility Recommendations 1A SPT; gait as tolerated by SOB- use BMAT PT Referral Recommendations OT;SW PT Discharge Recommendations Anticipate return to prior living arrangement;Home PT;Intermittent supervision/assist PT Discharge Equipment Recommended To be determined Transportation Recommendations W/c or family PT Assessment/Recommendations Reviewed With: Patient;Nursing;Occupational Therapy;Physician Next PT Visit Gait with chair follow, stairs, assess w/c mobility PT needs to see patient prior to DC  Yes Time Calculation Total Time Therapeutic Activities (minutes) 0 Total Time Gait Training (minutes) 0 Total Time Therapeutic Exercises (minutes) 0 Total Time Neuromuscular Re-education (minutes) 0  Total Time Caregiver Training (minutes) 0 Total Time Group Therapy (minutes) 0 PT Timed Codes 0 PT Untimed Codes 35 PT Unbilled Time 0 PT Total Treatment 35 Plan and Onset date Plan of Care Date 11/06/23 Onset Date 11/05/23 Treatment Start Date 11/06/23 Daved Rocks, PT, DPTPlease contact physical therapist on the treatment team via secure chat or via the secure chat group for your unit Physical Therapist with any questions.On weekends, please utilize the secure chat group, [facility] 1st call, to contact PT. [1] Past Medical History:Diagnosis Date  Arthritis   CHF (congestive heart failure)   Colon polyp   Dialysis patient   ESRD (end stage renal disease)   GERD (gastroesophageal reflux disease)   Hypercholesterolemia   Hypotension   Ischemic colitis   Thyroid  disease   hypo

## 2023-11-06 NOTE — Progress Notes (Addendum)
 [facility] REFERRAL INFORMATION NOTEHospital discharge date[facility]25The following services have been arranged with [facility] [phone]/ [phone]:____Nursing _X___Physical Therapy _X___Occupational Therapy____Speech Therapist____Medical Social Worker____Home Health AideThe agency will call to schedule first home visit date for (CHN,PT etc) services within 24-48 hours of facility discharge, and pt/family are in agreement.Special instructions for scheduling first visit : Call ahead, Has HD TTSThe following medical equipment and/or supplies have been arranged through:____walker____wheelchair____commode____shower chair____Personal Emergency Response System____oxygen____cane____hospital bed____infusion supplies____tube feeding supplies____catheter supplies____ostomy supplies____wound care supplies____other: _X___No medical equipment and/or supplies have been arranged.Blood draw: N/A Focus of care and identified patient/caregiver teaching needs at home:  Pt w/ Stage IV Renal CA admitted w/ weakness, Hypotension, Pulmonary edema, Acute Hypoxic Respiratory FailureRecommended diet: regular dietRecommended activity: activity as toleratedThe above arrangements are based on an in-hospital evaluation. It is short-term and will be re-evaluated by the Home Health Care Nurse in the home on a regular basis, as per physician's order. Suzen Schwab [facility] Care Coordinator [facility]: [phone]: [phone]

## 2023-11-06 NOTE — Provider Consult (Addendum)
 Nephrology Hemodialysis Note- I personally saw Shane Pearson while on dialysis today to assess hemodynamic stability and ultrafiltration goal.Objective Current Vitals Vitals Range (24 Hours) BP 106/57   Pulse 88   Temp 36.3 C (97.3 F)   Resp 22   Ht 1.651 m (5' 5)   Wt 77.9 kg (171 lb 11.8 oz)   SpO2 94%   BMI 28.58 kg/m  BP: (70-111)/(34-58) Temp:  [35.7 C (96.3 F)-36.8 C (98.2 F)] Temp src: Temporal (08/27 1311)Heart Rate:  [88-111] Resp:  [16-31] SpO2:  [90 %-100 %] Height:  [165.1 cm (5' 5)] Weight:  [77.9 kg (171 lb 11.8 oz)]  I/Os Weight No intake/output data recorded. Last 4 Weights  11/06/23 0325 11/06/23 1330 Weight: 77.9 kg (171 lb 11.8 oz) 77.9 kg (171 lb 11.8 oz)  Physical Exam:Constitutional:General: Not in acute distressCVS: Rate and Rhythm: Normal ratePulmonary: Effort: No respiratory distress, LCTAB anteriorly Neuro: General: No focal deficit notedAccess: HDTC dialyzing well Lab Results Component Value Date  NA 139 11/06/2023  K 5.5 (H) 11/06/2023  CA 8.5 (L) 11/06/2023  PO4 4.0 11/05/2023  HGB 10.4 (L) 11/06/2023  A/P:Shane Pearson is a 63 y.o. 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 Who presented to the ER for worsening bilateral lower extremity weakness and pain. Concern for disease progression. Nephrology is managing dialysis and UF.ESRD on HD - Continue Nephrovite as above- Follow Chemistry- Dose adjust meds to CrCL <10- Electrolytes stable - Continue dialysis with the following parameters:Active Dialysis Orders Dialysis  Hemodialysis   Frequency: 1 Time   Start Date/Time: 11/06/23 1050    Number of Occurrences: 1 Occurrences   Order Questions:    Dialyzer F180    K/Ca (meq) 2K/2.5Ca    Na (meq) 138     Na Modeling None    Bicarb (meq) 35    Dialysate Temp (C) 36    BFR (mL/min) 400    DFR (mL/min) 1.5x BFR    Duration (hours) 2    EDW (kgs) EDW 75 Kg    Access CVC    Vital Signs Vitals signs at the start of treatment and every 30 minutes    Vital Signs Check temperature at the start of treatment and at the end of treatment    Monitoring May place patient on telemtry per nursing descretion for concerns regarding stability    Notify dialysis provider for Pulse Less Than 50    Notify dialysis provider for Pulse Greater Than 130    Notify dialysis provider for Temperature (Celsius) Above 38    Notify dialysis provider for Systolic Blood Pressure Below 90    Notify dialysis provider for Systolic Blood Pressure Above 819    Fluid removal (L) 2 L as tolerated: Keep SBP > 85-90    Fluid removal instructions Decrease fluid removal goal by 500 ml  if blood pressure is decreasing or HR increasing. Reassess response every 15 minutes.    Fluid removal instructions May increase fluid removal goal by 500 ml if blood pressure and HR are stable.  Reassess response every 15 minutes.    Fluid removal instructions Decrease fluid removal to minimum if call/hold parameters for BP and/or HR exceeded.  Reassess response every 15 minutes.    Fluid removal instructions If no BP/HR response in 15 minutes after notifying MD, give 200 ml isotonic saline.  Reassess response every 15 minutes. Renal osteodystrophy:  Calcium  and phos acceptable - Cont calcitriol - Cont sevelamer  Anemia: Continue EPO as per protocolHemodynamics: Stable BPs. UF today with HD as hemodynamics allow Dialysis is stable.  Plan for next HD per patient's usual schedule.Shane Pearson, MBBSNephrology DivisionUniversity of Reedsville Medical CenterElectronically Signed on 11/06/2023 at 2:22 PM Nephrology Note 11/06/2023 10:20 AMConsulting Provider: Dr. Jearld Shallow Chief Complaint: Shortness of  Breath/Hypotension/WeaknessReason for Consult: Management of Dialysis History of Present Illness:Shane Pearson is a 63 y.o. 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 Who presented to the ER for worsening bilateral lower extremity weakness and pain. Concern for disease progression. Patient recently admitted 7/19-7/31 in the setting of diagnosing metastatic stage IV carcinoma likely renal cell in origin. Admitted 8/7-8/8 for weakness in lower extremities, multiple spiny bony metastases noted on MRI, no cauda equina or conus medullaris compression. He was discharged with a walker.He reports progressive weakness since discharge, he presented to his radiation therapy appointment with hypotension, SOB, and weakness which his Radiation oncologist advised him to go to the ED. He has been having increasingly SOB since Saturday when he had a shorter dialysis appointment than usual. He endorsed pain all over bur mainly over his L mid [place]. He has numbness and decreased sensation in both feet. He was doing well until he completed his steroid course.  He also reports feeling very SOB with exertion. He does not wear supplemental oxygen at home. He denies fevers, chills, or recent sick contacts.  He denies dysuria or frequency. Mild nausea but no worse than chronic for him, thinks this is mostly because he doesn't eat much. ER Course: Hypotensive (78/57), Tachycardic (111), RR 16, Temperature 96.6 F, and oxygen sats on room air 95%. EKG: Sinus tachycardia Labs: K+ 5.5, AG 20, WBC 22.5 Imaging: Chest x-ray Bilateral perihilar interstitial opacities consistent with mild pulmonary edema. Moderate left and trace right pleural fluid with bibasilar atelectasis. Nephrology was consulted for dialysis needs. He received dialysis at [facility]. He was last dialyze don Tuesday, his treatment was shortened due to left eye pain. Review of Systems 10 point ROS is otherwise negative except those mentioned abovePast Medical History[1]Past Surgical History[2]Family History[3]Social History Socioeconomic History  Marital status: Married Tobacco Use  Smoking status: Former   Packs/day: 0.30   Years: 15.00   Additional pack years: 0.00   Total pack years: 4.50   Types: Cigarettes   Quit date: 08/02/1992   Years since quitting: 31.2  Smokeless tobacco: Never Substance and Sexual Activity  Alcohol use: No  Drug use: No Allergies: Allergies[4]Prior to Admission Medications:(Not in a hospital admission)Current Meds:Current Facility-Administered Medications Medication Dose Route Frequency  aspirin  EC tablet 81 mg  81 mg Oral Daily  atorvastatin  (LIPITOR) tablet 20 mg  20 mg Oral Daily with dinner  B complex-vitamin C-folic acid  (NEPHRO-VITE) tablet 1 tablet  1 tablet Oral Daily  calcium -vitamin D  (OSCAL-500) 500mg -200unit per tablet 1 tablet  1 tablet Oral BID  cinacalcet  (SENSIPAR ) tablet 60 mg  60 mg Oral QPM  fluticasone  (FLONASE ) 50 MCG/ACT nasal spray 1 spray  1 spray Nasal Daily  pantoprazole  (PROTONIX ) EC tablet 40 mg  40 mg Oral QAM  acetaminophen  (TYLENOL ) tablet 650 mg  650 mg Oral Q4H PRN  melatonin tablet 3 mg  3 mg Oral QHS PRN  ondansetron  (ZOFRAN ) injection 4 mg  4 mg Intravenous Q6H PRN  senna (SENOKOT) tablet 1  tablet  1 tablet Oral Nightly  polyethylene glycol (GLYCOLAX ,MIRALAX ) powder 17 g  17 g Oral Daily PRN  ceFEPIme  (MAXIPIME ) 1,000 mg in dextrose  5% 58 mL IVPB  1,000 mg Intravenous Q24H  enoxaparin  (LOVENOX ) injection 30 mg  30 mg Subcutaneous Daily @ 2100  sodium chloride  0.9 % FLUSH REQUIRED IF PATIENT HAS IV  0-500 mL/hr Intravenous PRN  dextrose  5 % FLUSH REQUIRED IF PATIENT HAS IV  0-500 mL/hr Intravenous PRN  midodrine   (PROAMATINE ) tablet 10 mg  10 mg Oral TID  fludrocortisone  (FLORINEF ) tablet 0.1 mg  0.1 mg Oral TID Current Outpatient Medications Medication  acetaminophen  (TYLENOL ) 500 mg tablet  midodrine  (PROAMATINE ) 10 mg tablet  pantoprazole  (PROTONIX ) 40 mg EC tablet  magnesium  oxide (MAGNESIUM  OXIDE -MG SUPPLEMENT) 400 (241.3 mg) mg tablet  aspirin  81 MG EC tablet  fluticasone  (FLONASE ) 50 MCG/ACT nasal spray  atorvastatin  (LIPITOR) 20 mg tablet  fludrocortisone  (FLORINEF ) 0.1 mg tablet  b complex-vitamin c-folic acid  (NEPHRO-VITE) tablet  calcium -vitamin D  (OSCAL-500) 500-200 MG-UNIT per tablet  cinacalcet  (SENSIPAR ) 30 MG tablet  walker Physical Exam:Gen: Pleasant male, sitting up in bed in NAD Blood pressure (!) 81/58, pulse 97, temperature 36.5 C (97.7 F), resp. rate (!) 25, height 1.651 m (5' 5), weight 77.9 kg (171 lb 11.8 oz), SpO2 95%.Eyes: Conj pink; sclera non-icteric; glasses ENT:  MMM Chest: Normal WOB, Few rales noted. Supplemental oxygen via NC 2 L; A LIJ HDTC is present in the chest wallHeart: RRR, no G/R/M; no edema; extremities are well perfused; Left arm with old fistula Abdomen: Soft, nt, ndMusk: No hot or tender joints; normal gaitNeuro: Alert and oriented x 3; interactive; Moves all extremities, No focal deficits noted Skin:  No rashes or ulcers on visible skinRecent Labs Lab 08/26/251829 Sodium 140 Potassium 5.3* Chloride 90* CO2 28 UN 52* Creatinine 6.63* Glucose 74 Calcium  8.2* Albumin 3.4* Phosphorus 4.0 Magnesium  1.7   Component Value Date/Time  PTH 309.0 (H) 10/17/2023 1644  Recent Labs Lab 08/27/250917 08/26/251829 WBC 17.7* 22.5* Hemoglobin 10.4* 11.0* Hematocrit 35* 36* Platelets 270 294   Component Value Date/Time  FE 65 10/17/2023 1644  IBC 176 (L) 10/17/2023 1644  FESAT 37 10/17/2023 1644  FER 881 (H) 10/17/2023 1644  US  doppler vein LEFT lower  extremityResult Date: 8/27/2025No evidence of deep venous thrombosis in the left lower extremity. Hypoechoic nodules/nodes visualized in the left thigh, in the context of known metastatic disease. END OF IMPRESSION EKG 12 leadResult Date: 8/26/2025Sinus tachycardia Low voltage, extremity leads*Chest standard frontal and lateral viewsResult Date: 8/26/2025Bilateral perihilar interstitial opacities consistent with mild pulmonary edema. Moderate left and trace right pleural fluid with bibasilar atelectasis. END OF IMPRESSION I have personally reviewed the images and the Resident's/Fellow's interpretation and agree with or edited the findings.  Assessment/PlanGreg Pearson is a 63 y.o. 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 Who presented to the ER for worsening bilateral lower extremity weakness and pain. Concern for disease progression. 1. ESRD on HD TTS @ [facility]:- Follow chemistries - Renally dose medications for CrCl < 10 ml/min - Nephrovite 1 tab PO daily - Will perform 2 hour HD today mild hyperkalemia and fluid removal. - Will dialyze per prescription below: Active Dialysis Orders Dialysis  Hemodialysis   Frequency: 1 Time   Start Date/Time: 11/06/23 1050    Number of Occurrences: 1 Occurrences   Order Questions:  Dialyzer F180    K/Ca (meq) 2K/2.5Ca    Na (meq) 138    Na Modeling None    Bicarb (meq) 35    Dialysate Temp (C) 36    BFR (mL/min) 400    DFR (mL/min) 1.5x BFR    Duration (hours) 2    EDW (kgs) EDW 75 Kg    Access CVC    Vital Signs Vitals signs at the start of treatment and every 30 minutes    Vital Signs Check temperature at the start of treatment and at the end of treatment    Monitoring May place patient on telemtry per nursing  descretion for concerns regarding stability    Notify dialysis provider for Pulse Less Than 50    Notify dialysis provider for Pulse Greater Than 130    Notify dialysis provider for Temperature (Celsius) Above 38    Notify dialysis provider for Systolic Blood Pressure Below 90    Notify dialysis provider for Systolic Blood Pressure Above 819    Fluid removal (L) 2 L as tolerated: Keep SBP > 85-90    Fluid removal instructions Decrease fluid removal goal by 500 ml  if blood pressure is decreasing or HR increasing. Reassess response every 15 minutes.    Fluid removal instructions May increase fluid removal goal by 500 ml if blood pressure and HR are stable.  Reassess response every 15 minutes.    Fluid removal instructions Decrease fluid removal to minimum if call/hold parameters for BP and/or HR exceeded.  Reassess response every 15 minutes.    Fluid removal instructions If no BP/HR response in 15 minutes after notifying MD, give 200 ml isotonic saline.  Reassess response every 15 minutes. 2. Hyperkalemia: Mild - Should improve with dialysis - Please ensure patient is on a low K+ diet 3. Hemodynamics and Volume status:  Hypotension  - SBPs 81-93 - Continue Midodrine  10 mg TID and Florinef  0.1 mg TID - Will optimize fluid removal with dialysis as hemodynamics allow 4.  Anemia: goal 10-11 g/dl - Hgb within goal range, 10.4 - No ESA due to diagnosis of metastatic carcinoma of renal origin- Transfusion per primary team parameters - Trend H/H - Check iron stores 5. CKD BMD:- Calcium  is acceptable, 8.5 - Intact PTH on 10/17/23 - 309- Continue Oscal 500 1 tab BID- Cinacalcet  60 mg every evening - On no phos binders - Please check phosphate levels with next lab draw 6. Vascular Access:- LIJ HDTC Rest of care per primary team. Thank you for allowing our team to participate in the care of this patient. Please call or page with questions.  Author: Elige FORBES Schultze, NP  as of: 11/06/2023  at: 10:20 AMPager [phone] [1] Past Medical History:Diagnosis Date  Arthritis   CHF (congestive heart failure)   Colon polyp   Dialysis patient   ESRD (end stage renal disease)   GERD (gastroesophageal reflux disease)   Hypercholesterolemia   Hypotension   Ischemic colitis   Thyroid  disease   hypo [2] Past Surgical History:Procedure Laterality Date  DIALYSIS FISTULA CREATION Bilateral   4 fistulas have failed- ? d/t low BP and small veins  HERNIA REPAIR    PR LIGATION/BIOPSY TEMPORAL ARTERY Right 10/04/2023  Procedure: TEMPORAL ARTERY BIOPSY RIGHT EYE;  Surgeon: Lynn Ervin Edelman, MD;  Location: [facility] MAIN OR;  Service: Ophthalmology  Right inguinal hernia repair   [3] Family HistoryProblem Relation Name Age of Onset  Diabetes Mother    Asthma Mother    Coronary artery  disease Father   [4] AllergiesAllergen Reactions  Penicillins Swelling and Rash   Facial swellingMed History Tech discussed Penicillin allergy with patient on 10/18/23. Please see Antimicrobial Stewardship Chart Note for additional details.  Coconut Flavoring Agent (Non-Screening) Nausea And Vomiting   All sources coconut (including raw) --causes regurgitation   Amoxicillin Swelling and Rash   Facial swellingMed History Tech discussed Amoxicillin allergy with patient on 10/18/23. Please see Antimicrobial Stewardship Chart Note for additional details.  Baclofen Other (See Comments)   confusion  No Known Latex Allergy

## 2023-11-06 NOTE — ED Notes (Signed)
 Pt refusing 2nd set of blood cultures. Provider aware.

## 2023-11-06 NOTE — H&P (Signed)
 [facility] ONCOLOGY ADMISSION NOTE Name: Shane Pearson MRN: [id number] Age: 63 y.o. DOB: Jan 13, 1962Date: 11/06/2023 Date of Admission: 11/05/2023 Location: [facility] [place]) - [id number] Admitting Provider: Shirl Lolita CROME, MD PCP: Juliene Debby PARAS, MDHISTORY OF PRESENT ILLNESS Shane Pearson [phone]) is a 63 y.o. 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 for progressively worsening weakness, especially L UE however at that time MRIs were without evidence of cord compression and rad onc recommended steroids 4mg  BID which were recently finished on 8/22.  He was discharged with PT and OT home services.Patient states that he felt well after leaving the hospital while he was on steroids for about 15 days until the prescription ran out on Friday.  Over the last 5 days has been downhill since then.  PT note on 8/21 shows patient was walking up to 75 feet at that time.  Patient states that he has been able to walk similarly but more recently has been needing his wife to help him or has been using wheelchair due to the shortness of breath and fatigue.  Reports feeling generalized weakness all over.  He states he has been able to get to the bathroom on time and denies any bowel incontinence.  He does not make urine anymore.  He denies any saddle anesthesia.  He states that his left leg has been bothering him especially over the last 2 days feeling like he has a blood clot in it due to pain in his calf from his knee down.  He states that moving makes a little better but currently his pain is 0/10.  Normally he has lower back pain but because he has been walking less, he has had less  back pain.  He reports that shortness of breath is all the time though denies PND or orthopnea.  He denies any cough, sore throat, sick contacts, rhinorrhea or coryza.  Reports some lower extremity swelling but this is not unusual in the amount of swelling depends on dialysis.  He reports his left arm does not always behave like it supposed to and has some tingling in that arm due to the cancer.He reports a good appetite, perhaps increased thirst.  He sometimes has headaches but not an unusual amount.  He started having double vision 4 to 5 weeks ago but has been seeing ophthalmology for this.  He reports some lightheadedness related to his hypotension/orthostasis which is not unusual for him.  He last had dialysis yesterday on Tuesday and denies missing any sessions.  He had an abridged session on Saturday though. After seeing his oncologist today he decided to come into the ED because Dr. Bronson was concerned for cord compression as it was reported that he was unable to stand for any period of time.  In the office he was unable to lift either of his lower extremities.He denies any nausea, vomiting, constipation, diarrhea, fevers, chills, chest pain, tremors, shakes, palpitations, or LOC. Denies recent travel or trauma. Pt lives in a house with spouse. Pt walks with a walker at baseline.ONCOLOGY HISTORY .  Oncology History  No problem history exists.   REVIEW OF SYSTEMS Review of Systems- SEE HPIOBJECTIVE: PHYSICAL EXAM, VITAL SIGNS, LABS, IMAGES,  CURRENT MEDS Objective  Physical ExamVitals and nursing note reviewed. Constitutional:     General: He is not in acute distress.   Appearance: He is ill-appearing. He is not toxic-appearing. HENT:    Head: Normocephalic and atraumatic.    Nose: Nose normal.    Mouth/Throat:    Mouth: Mucous membranes are moist.    Pharynx: Oropharynx is clear. Eyes:    Extraocular Movements: Extraocular movements intact.     Conjunctiva/sclera: Conjunctivae normal.    Pupils: Pupils are equal, round, and reactive to light. Cardiovascular:    Rate and Rhythm: Normal rate and regular rhythm.    Pulses: Normal pulses.    Heart sounds: No murmur heard.   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 Left upper chest with HD cath present. Pulmonary:    Effort: Tachypnea and accessory muscle usage present. No respiratory distress.    Breath sounds: Decreased air movement present. Examination of the right-middle field reveals decreased breath sounds. Examination of the left-middle field reveals decreased breath sounds. Examination of the right-lower field reveals decreased breath sounds and rales. Examination of the left-lower field reveals decreased breath sounds and rales. Decreased breath sounds, wheezing (faint) and rales present. No rhonchi. Chest:    Chest wall: No tenderness. Abdominal:    General: Bowel sounds are normal. There is no distension.    Palpations: Abdomen is soft. There is no mass.    Tenderness: There is abdominal tenderness (mild epigastric tenderness and lower bilatearl abd tenderness). There is no right CVA tenderness, left CVA tenderness, guarding or rebound.    Hernia: No hernia is present. Musculoskeletal:    Cervical back: Neck supple. No tenderness.    Right lower leg: Edema present.    Left lower leg: Edema present. Skin:   General: Skin is warm and dry.    Findings: No bruising, lesion or rash. Neurological:    Mental Status: He is alert and oriented to person, place, and time.    Sensory: No sensory deficit (no saddle anesthesia).    Motor: Weakness (3/5 srength in lower legs bilaterally though left leg seems more weak and drops to the bed faster due to pain. 4/5 strngth in his RUE, 3/5 in his LUE (unable to keep it elevated due to pain & weakness)) present. Psychiatric:       Mood and Affect: Mood normal.       Behavior: Behavior normal.       Thought  Content: Thought content normal.       Judgment: Judgment normal.   Patient Vitals for the past 24 hrs: BP Temp Temp src Pulse Resp SpO2 11/06/23 0210 (!) 86/57 -- -- 92 (!) 25 90 % 11/06/23 0110 93/54 -- -- -- -- -- 11/05/23 2300 (!) 82/47 -- -- 93 23 93 % 11/05/23 2125 (!) 80/55 -- -- 98 22 94 % 11/05/23 2106 (!) 71/34 -- -- 97 23 93 % 11/05/23 2005 (!) 81/56 -- -- 94 (!) 31 94 % 11/05/23 1657 (!) 70/48 -- -- -- -- 93 % 11/05/23 1620 (!) 78/57 35.9 C (96.6 F) TEMPORAL (!) 111 16 95 % O2 Flow Rate: 2 L/min (11/06/23 0210) Recent Labs   08/26/251829 WBC 22.5* Hemoglobin 11.0* Hematocrit 36* Platelets 294 No components found with this basename: PT Recent Labs   08/26/251829 Sodium 140 Potassium 5.3* Chloride 90* CO2 28 UN 52* Creatinine 6.63* Glucose 74 Calcium  8.2* Magnesium  1.7 Phosphorus 4.0  Recent Labs   08/26/251829 AST 78* ALT 11 Alk Phos 268* Bilirubin,Total 0.4  Bilirubin,Direct <0.2 Total Protein 6.1* Amylase 47 Lipase 17 Albumin 3.4*  No results for input(s): PTI, INR, PTT in the last 168 hours. No results for input(s): CK, MCKMB, BNP, TROP, TROPU in the last 72 hours. Recent Labs Lab 08/26/251954 Lactate 2.9*  No results found for: URINEAerobic bacterial pleural or thoracentesis fluid culture and stain Date Value Ref Range Status 10/01/2023 Lab Cancel  Final Bacterial Blood Culture Date Value Ref Range Status 11/05/2023 .  Preliminary 10/17/2023 .  Final 10/17/2023 .  Final  Recent Labs   08/26/251954 PH,VENOUS 7.47* PCO2,VENOUS 43 PO2,VENOUS 43 Bicarbonate,VENOUS 31* Base Excess,VENOUS 7*  Recent Labs   08/26/251954 CO 1.2  IMAGESEKG 12 leadResult Date: 8/26/2025Sinus tachycardia Low voltage, extremity leads*Chest standard frontal and lateral viewsResult Date: 8/26/2025Bilateral perihilar interstitial  opacities consistent with mild pulmonary edema. Moderate left and trace right pleural fluid with bibasilar atelectasis. END OF IMPRESSION I have personally reviewed the images and the Resident's/Fellow's interpretation and agree with or edited the findings.  CURRENT MEDICATIONS midodrine   10 mg Oral TID  fludrocortisone   0.1 mg Oral TID  sodium chloride , dextrose , acetaminophen      HISTORY & MEDICATIONS Medical/Surgical/Family History  Allergies Allergen Reactions  Penicillins Swelling and Rash   Facial swellingMed History Tech discussed Penicillin allergy with patient on 10/18/23. Please see Antimicrobial Stewardship Chart Note for additional details.  Coconut Flavoring Agent (Non-Screening) Nausea And Vomiting   All sources coconut (including raw) --causes regurgitation   Amoxicillin Swelling and Rash   Facial swellingMed History Tech discussed Amoxicillin allergy with patient on 10/18/23. Please see Antimicrobial Stewardship Chart Note for additional details.  Baclofen Other (See Comments)   confusion  No Known Latex Allergy   Past Medical History: Diagnosis Date  Arthritis   CHF (congestive heart failure)   Colon polyp   Dialysis patient   ESRD (end stage renal disease)   GERD (gastroesophageal reflux disease)   Hypercholesterolemia   Hypotension   Ischemic colitis   Thyroid  disease   hypo  Past Surgical History: Procedure Laterality Date  DIALYSIS FISTULA CREATION Bilateral   4 fistulas have failed- ? d/t low BP and small veins  HERNIA REPAIR    PR LIGATION/BIOPSY TEMPORAL ARTERY Right 10/04/2023  Procedure: TEMPORAL ARTERY BIOPSY RIGHT EYE;  Surgeon: Lynn Ervin Edelman, MD;  Location: [facility] MAIN OR;  Service: Ophthalmology  Right inguinal hernia repair    Social History Social History Narrative  Not on file  Family History Problem Relation Age of Onset  Diabetes Mother   Asthma  Mother   Coronary artery disease Father     Medications  Prior to Admission Medications   Prescriptions Last Dose Informant Patient Reported? Taking?  acetaminophen  (TYLENOL ) 500 mg tablet   Yes No  Take 1 tablet (500 mg total) by mouth 3 times daily as needed for Pain for Pain. Take 2 tabs (1000mg ) by mouth three times a day as needed for pain. MDD 3000mg .  midodrine  (PROAMATINE ) 10 mg tablet   Yes No  Take 1 tablet (10 mg total) by mouth 3 times daily for Disorder of Low Blood Pressure.  pantoprazole  (PROTONIX ) 40 mg EC tablet   No No  Take 1 tablet (40 mg total) by mouth every morning. Swallow whole. Do not crush, break, or chew.  walker   No No  Exact equipment needed: Rolling walker Specific length of time needed: LifetimeICD10 Code: R26.2 Ht: 5' 5   Wt: 175 lbs  magnesium  oxide (MAGNESIUM  OXIDE -MG SUPPLEMENT) 400 (241.3 mg) mg tablet  No No  Take 1 tablet (400 mg total) by mouth daily.  aspirin  81 MG EC tablet   Yes No  Take 1 tablet (81 mg total) by mouth daily for DVT prophylaxis.  fluticasone  (FLONASE ) 50 MCG/ACT nasal spray   Yes No  Spray 1 spray into nostril daily for allergies.  Spray 1 spray each nares  atorvastatin  (LIPITOR) 20 mg tablet   Yes No  Take 1 tablet (20 mg total) by mouth daily (with dinner) for High Amount of Fats in the Blood.  fludrocortisone  (FLORINEF ) 0.1 mg tablet   Yes No  Take 1 tablet (100 mcg total) by mouth 3 times daily for Blood Pressure Drop Upon Standing.  b complex-vitamin c-folic acid  (NEPHRO-VITE) tablet   Yes No  Take 1 tablet by mouth daily for Complex vitamin.  calcium -vitamin D  (OSCAL-500) 500-200 MG-UNIT per tablet   Yes No  Take 1 tablet by mouth 2 times daily for Vit D.  cinacalcet  (SENSIPAR ) 30 MG tablet   Yes No  Take 2 tablets (60 mg total) by mouth every evening for parathyroid.      ASSESSMENT & PLAN Active Hospital Problems  Diagnosis   Weakness  Shane Pearson  ([id number]) is a 63 y.o. 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 Bronson Drew, MD- Last treated with 2 of 5 fractions of radiation to the cervical spine, can call rad onc in the AM- Dr. Bronson would also recommend urgent inpatient biopsy of one of the paraspinal muscular lesionWorsening lower extremity weakness-MRI spine imaging on 8/7 showing C2, C4 and C7 bony mets with mild narrowing of the thecal sac unchanged from prior.  Likely mild C6-7 and C7-T1 L neural foramina narrowing (which is used to explain left arm numbness and pain).  Unchanged multiple posterior paraspinal musculature lesions. T6, T10-12 bony metastasis.  Diffuse bony mets of the L-spine and sacrum.  No evidence of cord compression.-Last physical therapy note on 8/20 notes that patient was able to ambulate 75 and 58 feet with a walker.-B12 normal on 8/7, iron panel without deficiency- TSH 13.1 with mildly low free T4 @0 .6 from 8/7- Dr. Bronson concerned for cauda equina, will order MRI spine stat, poor candidate for surgery given extent of mets, will consult ortho spine after imaging if needed- LLE doppler to r/o DVTAcute hypoxic respiratory failure with hypotension 2/2 sepsis? Vs CHF-Baseline BPs 80s to 100 SBP, on admission 70/44-> 82/47-BP lower than normal but patient had not received medications from home today- new hypoxia, on 2L NC with SPO2 93%, wean as able-BCx in process, UA if abl to produce but pt states he is anuric, COVID/Flu/RSV/MRSA ordered-WBC 22.5 (11.5 on 8/8), lactate 2.9-> ICU recommended 500 mL liter bolus-CXR showing mild pulmonary  edema, moderate left and trace right pleural fluid-Started on cefepime  empirically 8/26-present-> continues though unclear source of infection if present-Cont home Midodrine  10mg  TID & Florinef  TID- consider updating echocardiogramChronic Medical Problems:End-stage renal disease on HD TTS-Nephrology consult for HD-Nephro-Vite-CinacalcetCHFHyperlipidemia-Aspirin -Atorvastatin -Last stress echo 09/17/2023 showing hyperdynamic EF 96%.  No ischemia or infarct.  No significant coronary calcifications.-Last echo 02/27/2018 showing LVEF 65% with normal size and function, no RWMA but did show grade 2 diastolic  dysfunction, normal RV size and function.GERD- PPI_____________Diet: Diet low potassiumGI ppx: PPINausea Tx/ppx: Zofran  PRN , , Constipation ppx: senna daily , Miralax  prn DVT ppx: lovenoxLabs: CBC BMP Mg PhosTelemetry: IndefinitelyActivity: Walks with a walker at baseline ; Currently OOB as toleratedPT/OT: Ordered PT OTDisposition: From Home with Family/Friends, going back to sameInpatient Full Code: ConfirmedMedication Reconciliation:  Confirmed Completed with patientSigned Electronically by Camie Earnie [place], PA as of: 11/06/2023 at: 2:12 AM  Group e-mail sent: yes Charges complete: no

## 2023-11-06 NOTE — Progress Notes (Signed)
 EvaluationDischarge Recommendations:  Discharge to Planned Living Arrangement with Caregiver Supervision and Assist for ADLs, functional mobility/transfers, and IADLs. Patient's ADL skills are not a barrier to discharge.  Patient will remain on OT caseload to address promoting indep with ADLs and energy conservation strategies. Recommend Home OT. Pt reports wife can assist upon d/c.   OT need to see patient prior to discharge: NoEquipment Recommendations: 3:1 commode as w/c may not fit into bathroom   Additional justification: Standard 3 in 1 Commode. Recommending a 3 in 1 Commode to place bedside as pt will be staying/confined to (first floor/1 room) at discharge. A 3 in 1 commode is necessary for toileting transfers, and to reduce the burden of care on caregiver/family with transfers and toileting tasks.OT Hospital Stay Recommendations: Encourage participation with ADLs, Toilet transfer status - 1 assist stand pivot to w/c, and OOB for meals  OT Referral Recommendations: Physical TherapyHPI:Admitting Dx: Active Hospital Problems  Weakness  PMH: Past Medical History[1]PSH: Past Surgical History[2]SUBJECTIVE and OBJECTIVE:Precautions: Fall Precautions: General fall precautionsL/D/A: IV lines, Monitors, and Supplemental O2 on 2LActivity order: Activity as toleratedPatient wearing mask: NoWriter wearing PPE including: Mask and GlovesVitals: BP at rest:77/55, sitting EOB: 93/57, standing 96/68. SpO2 >94% on 2L. Tachypneic with RR in mid 30s with activity Co-treat with Physical Therapy for patient tolerance and complementary goalsHome Living Prior to Admission: Type of home: [place] home# of steps to enter home:  2# of steps in home: 0Bedroom: First floorBathroom: First floorBathroom (shower/tub): Walk-in showerHome equipment: Rollator, Paediatric nurse , Grab bar in shower, Rolling walker, and crutches, adjustable bed, stairlift  Prior Function:Resides with: wife and elderly mother Level of independence:ADLs: Needs assistanceIADLs: Needs assistanceFunction mobility: IndependentReceives help from: FamilyPt reports wife assists with sponge bathing (does not shower), dressing, and all IADLs. Pt is indep with toileting.  Pt uses a RW at baseline, more recently was having family push him on the rollator d/t weakness/ SOB. Wife drives him to HD. Pt does not drive. Pain: 0ASSESSMENTCognition: No deficit notedVision: Impaired and Wears corrective lenses. Double vision at baseline- has glasses with prism  in right eye. UE Function: Hand Dominance: RightROM: RUE: WFL except shoulder flexion 1/4  LUE: WFL except shoulder flexion 1/4   Strength: RUE:  Shoulder: 2-/5Elbow: 4/5Hand: 4/5LUE:  Shoulder: 2-/5Elbow: 4/5Hand: 4/5 Functional mobility:Bed Mobility: TestedSupine to sit: supervision, HOB elevatedSit to supine: supervision, HOB flat     Functional Transfers: TestedSit to stand: contact guard assist, No device and RW Stand to sit: contact guard assist, No deviceStand Pivot Transfer: contact guard assist, No deviceToilet transfer: contact guard assist, No device Pt performed sit <> stand CGA to RW on 1st trial, BP stable. Pt then performed EOB > w/c > toilet > w/c > EOB with CGA with hand held assist. Pt with increased SOB and tachypnea with exertion. Pt left in bed, call light in reach. RN aware.  ADL Assessment:  ADLs completed/simulated with level of assist as dictated below: Feeding: independent Grooming: setup Toileting: contact guard assist - hands on for safety with hiking ASSESSMENT and PLANAssessment: Patient currently presenting with impaired ADL status, impaired self-care transfers, impaired UE ROM, impaired UE strength, and impaired endurance and will benefit from OT service to address ADL retraining, functional transfer  training, UE strengthening/ROM, endurance training, equipment eval/education, gross motor activities, and compensatory technique education.Frequency: 2-3x/weekPatient Education: role of OT, plan of care, discharge recommendations, progress, OT goals, call bell use, and equipment recommendations Learners: patient Readiness: acceptance Method: explanation Response: verbalizes understandingMultidisciplinary Communication:  patient,  RN, PT  OCCUPATIONAL THERAPY PROVIDER Electronically Signed By: Rocky Broach, OTPlease contact the OT via Secure Chat to: [facility]ational [facility] with any questions/concerns and/or update requests.Timed Calculations:Timed Codes:  0Untimed Codes: 38 min Ot eval Unbilled Time: 0Total Time:  38 min OT EVALUATION COMPLEXITY 1.  Occupational Profile & History (Medical/Therapy) Moderate (Expanded Review)2.  Public relations account executive (Includes occupations from: ADL, IADL, Rest/Sleep, Education, Work, Copywriter, advertising, Leisure, Social Participation) Moderate (3-5 occupations/performance deficits)3.  Clinical Decision Making i  Assessment  Moderate (Detailed) ii  Co-Morbidities  Moderate (1+) iii  Modifications  Moderate (Minimum - Moderate) iv Treatment Options (Approaches include: Create, Promote, Establish, Restore, Maintain, Modify, Prevent)  Moderate (Several) Moderate4.  EVALUATION COMPLEXITY as based on the above provided information Moderate [1] Past Medical History:Diagnosis Date  Arthritis   CHF (congestive heart failure)   Colon polyp   Dialysis patient   ESRD (end stage renal disease)   GERD (gastroesophageal reflux disease)   Hypercholesterolemia   Hypotension   Ischemic colitis   Thyroid  disease   hypo [2] Past Surgical History:Procedure Laterality Date  DIALYSIS FISTULA CREATION Bilateral   4 fistulas have failed- ? d/t low BP and small veins   HERNIA REPAIR    PR LIGATION/BIOPSY TEMPORAL ARTERY Right 10/04/2023  Procedure: TEMPORAL ARTERY BIOPSY RIGHT EYE;  Surgeon: Lynn Ervin Edelman, MD;  Location: [facility] MAIN OR;  Service: Ophthalmology  Right inguinal hernia repair

## 2023-11-06 NOTE — H&P (Incomplete)
 [facility] ONCOLOGY ADMISSION NOTE Name: Shane Pearson MRN: [id number] Age: 63 y.o. DOB: August 29, 1962Date: 11/06/2023 Date of Admission: 11/05/2023 Location: [facility] [place]) - [id number] Admitting Provider: Norwood Skates, MD PCP: Juliene Debby PARAS, MDHISTORY OF PRESENT ILLNESS Shane Pearson [phone]) is a 63 y.o. 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 Virgia for intracranial mets and was started on palliative RT to the cervical spine by Dr.LiuPrimary concern is that he can no longer walk or stand up for long period of time.******Pt lives in a {evy123story:53646} {evylive:53645} {evyfam:53647}. {evymobility:53648}Pt last took medications {evymeds:53649}.ONCOLOGY HISTORY .  Oncology History  No problem history exists. Oncologic History7/19/25: Presented to emergency room with generalized malaise, pain, weight loss and failure to thrive.09/28/23 to 10/10/23: Admitted to hospital for workup.  Imaging studies consistent of CT shoulder, cervical spine, head, chest, abdomen and pelvis (without contrast); MRI cervical, thoracic and lumbar spine, and X-ray of bilateral femurs, which showed no acute radiographic finding in left shoulder and head.  There is moderate right and small left layering pleural effusion, multiple enlarged mediastinal lymph nodes, right chest wall subcutaneous soft tissue density nodules, a 2.6 x 1.7 cm right adrenal lesion with indeterminate attenuation, polycystic kidney disease, retroperitoneal gastrohepatic, and periportal  lymphadenopathy, intramuscular lesions throughout the pelvis and paraspinal musculature, and osseous metastases in cervical, thoracic and lumbar spine.   IR biopsy of L5 lesion showed metastatic carcinoma of renal origin. Cytology of pleural fluid (dated 10/01/23) showed malignant tumor cells derived from carcinoma, supporting epithelial differentiation.  Bone scan showed diffuse background osseous uptake related to patient's renal osteodystrophy, uptake within the proximal right humerus, and predominantly cortical uptake within the proximal bilateral femurs.  Brain MRI showed numerous multifocal areas of punctate enhancement scattered throughout the brain parenchyma, consistent with intracranial metastatic disease. There was no significant mass effect and no evidence of associated hemorrhagic changes. 11/05/2023: ED for failure to thrive, worsening weakness and pain  REVIEW OF SYSTEMS Review of Systems- SEE HPIOBJECTIVE: PHYSICAL EXAM, VITAL SIGNS, LABS, IMAGES, CURRENT MEDS Objective  Physical Exam  Patient Vitals for the past 24 hrs: BP Temp Temp src Pulse Resp SpO2 11/05/23 2300 (!) 82/47 -- -- 93 23 93 % 11/05/23 2125 (!) 80/55 -- -- 98 22 94 % 11/05/23 2106 (!) 71/34 -- -- 97 23 93 % 11/05/23 2005 (!) 81/56 -- -- 94 (!) 31 94 % 11/05/23 1657 (!) 70/48 -- -- -- -- 93 % 11/05/23 1620 (!) 78/57 35.9 C (96.6 F) TEMPORAL (!) 111 16 95 % O2 Flow Rate: (S) 22 L/min (11/05/23 2259) Recent Labs   08/26/251829 WBC 22.5* Hemoglobin 11.0* Hematocrit 36* Platelets 294 No components found with this basename: PT Recent Labs   08/26/251829 Sodium 140 Potassium 5.3* Chloride 90* CO2 28 UN 52* Creatinine 6.63* Glucose 74 Calcium  8.2* Magnesium  1.7 Phosphorus 4.0  Recent Labs   08/26/251829 AST 78* ALT 11 Alk Phos 268* Bilirubin,Total 0.4 Bilirubin,Direct <0.2 Total Protein 6.1* Amylase 47 Lipase 17 Albumin 3.4*  No  results for input(s): PTI, INR, PTT in the last 168 hours. No results for input(s): CK, MCKMB, BNP, TROP, TROPU in the last 72 hours. Recent Labs Lab 08/26/251954  Lactate 2.9*  No results found for: URINEAerobic bacterial pleural or thoracentesis fluid culture and stain Date Value Ref Range Status 10/01/2023 Lab Cancel  Final Bacterial Blood Culture Date Value Ref Range Status 11/05/2023 .  Preliminary 10/17/2023 .  Final 10/17/2023 .  Final  Recent Labs   08/26/251954 PH,VENOUS 7.47* PCO2,VENOUS 43 PO2,VENOUS 43 Bicarbonate,VENOUS 31* Base Excess,VENOUS 7*  Recent Labs   08/26/251954 CO 1.2  IMAGESEKG 12 leadResult Date: 8/26/2025Sinus tachycardia Low voltage, extremity leads*Chest standard frontal and lateral viewsResult Date: 8/26/2025Bilateral perihilar interstitial opacities consistent with mild pulmonary edema. Moderate left and trace right pleural fluid with bibasilar atelectasis. END OF IMPRESSION I have personally reviewed the images and the Resident's/Fellow's interpretation and agree with or edited the findings.  CURRENT MEDICATIONS. midodrine   10 mg Oral TID . fludrocortisone   0.1 mg Oral TID . lactated ringers  bolus  500 mL Intravenous Once  sodium chloride , dextrose , acetaminophen      HISTORY & MEDICATIONS Medical/Surgical/Family History  Allergies Allergen Reactions . Penicillins Swelling and Rash   Facial swellingMed History Tech discussed Penicillin allergy with patient on 10/18/23. Please see Antimicrobial Stewardship Chart Note for additional details. . Coconut Flavoring Agent (Non-Screening) Nausea And Vomiting   All sources coconut (including raw) --causes regurgitation  . Amoxicillin Swelling and Rash   Facial swellingMed History Tech discussed Amoxicillin allergy with patient on 10/18/23. Please see Antimicrobial Stewardship Chart Note for additional  details. . Baclofen Other (See Comments)   confusion . No Known Latex Allergy   Past Medical History: Diagnosis Date . Arthritis  . CHF (congestive heart failure)  . Colon polyp  . Dialysis patient  . ESRD (end stage renal disease)  . GERD (gastroesophageal reflux disease)  . Hypercholesterolemia  . Hypotension  . Ischemic colitis  . Thyroid  disease   hypo  Past Surgical History: Procedure Laterality Date . DIALYSIS FISTULA CREATION Bilateral   4 fistulas have failed- ? d/t low BP and small veins . HERNIA REPAIR   . PR LIGATION/BIOPSY TEMPORAL ARTERY Right 10/04/2023  Procedure: TEMPORAL ARTERY BIOPSY RIGHT EYE;  Surgeon: Lynn Ervin Edelman, MD;  Location: [facility] MAIN OR;  Service: Ophthalmology . Right inguinal hernia repair    Social History Social History Narrative . Not on file  Family History Problem Relation Age of Onset . Diabetes Mother  . Asthma Mother  . Coronary artery disease Father     Medications  Prior to Admission Medications   Prescriptions Last Dose Informant Patient Reported? Taking?  acetaminophen  (TYLENOL ) 500 mg tablet   Yes No  Take 1 tablet (500 mg total) by mouth 3 times daily as needed for Pain for Pain. Take 2 tabs (1000mg ) by mouth three times a day as needed for pain. MDD 3000mg .  midodrine  (PROAMATINE ) 10 mg tablet   Yes No  Take 1 tablet (10 mg total) by mouth 3 times daily for Disorder of Low Blood Pressure.  pantoprazole  (PROTONIX ) 40 mg EC tablet   No No  Take 1 tablet (40 mg total) by mouth every morning. Swallow whole. Do not crush, break, or chew.  walker   No No  Exact equipment needed: Rolling walker Specific length of time needed: LifetimeICD10 Code: R26.2 Ht: 5' 5   Wt: 175 lbs  magnesium  oxide (MAGNESIUM  OXIDE -MG SUPPLEMENT) 400 (241.3 mg) mg tablet   No No  Take 1 tablet (400 mg total) by mouth daily.  aspirin  81 MG EC tablet   Yes No  Take 1  tablet (81 mg total) by mouth  daily for DVT prophylaxis.  fluticasone  (FLONASE ) 50 MCG/ACT nasal spray   Yes No  Spray 1 spray into nostril daily for allergies.  Spray 1 spray each nares  atorvastatin  (LIPITOR) 20 mg tablet   Yes No  Take 1 tablet (20 mg total) by mouth daily (with dinner) for High Amount of Fats in the Blood.  fludrocortisone  (FLORINEF ) 0.1 mg tablet   Yes No  Take 1 tablet (100 mcg total) by mouth 3 times daily for Blood Pressure Drop Upon Standing.  b complex-vitamin c-folic acid  (NEPHRO-VITE) tablet   Yes No  Take 1 tablet by mouth daily for Complex vitamin.  calcium -vitamin D  (OSCAL-500) 500-200 MG-UNIT per tablet   Yes No  Take 1 tablet by mouth 2 times daily for Vit D.  cinacalcet  (SENSIPAR ) 30 MG tablet   Yes No  Take 2 tablets (60 mg total) by mouth every evening for parathyroid.      ASSESSMENT & PLAN There are no active hospital problems to display for this patient.***ONC: - Followed outpatient by Dr. ***- Last treated with ******Chronic Medical Problems:***_____________Diet: No diet orders on fileGI ppx: {evygippx:40178}Nausea Tx/ppx: {evynausea:40179} , {evynausea:40179}, ***Constipation ppx: {evyconstipation:40172} , {evyconstipation:40172} , ***DVT ppx: {evydvtppx:40173}Labs: {evyordlabs:40171}Telemetry: {evytele:40169}Activity: {evyact:40174} ; Currently {evyact2:40175}PT/OT: {evyptot:40170}Disposition: From {evyfromdispo:40167}, going {evytodispo:40168}Emergency Prior: {EVYMOLST:40166}Medication Reconciliation:  {evymedrec:40176}Signed Electronically by Camie Earnie [place], PA as of: 11/06/2023 at: 12:10 AM  Group e-mail sent: {evyyesno:41259} Charges complete: {evyyesno:41259}

## 2023-11-06 NOTE — ED Notes (Signed)
 Ok to travel off tele per provider Ashwath Elangovan

## 2023-11-06 NOTE — Progress Notes (Signed)
 MRI C/T/L spine ordered with and without contrast.Patient is anuric on HD Tues/Thurs/SaturdayDiscussed with renal team they will plan on HD tomorrow - 24 hours after MRI.MRI team notified of plan.

## 2023-11-06 NOTE — ED Notes (Signed)
 Report Given ToAidan, RNDescriptive Sentence / Reason for Admission Hx renal carcinoma. radiation started yesterday today with worsening bilateral lower extremity weakness and pain. concern for disease progressionActive Issues / Relevant Events FULL CODEA/Ox4Hypotension, baseline reported (80s)2L NC, goal is RACXR: mild pulmonary edema L limb alertDialysis T/T/SPt reports he doesn't make urine WBC 22.5To Do ListV/A per policyMeds per MARMRI (screening form done)Ambulatory pulse oximetry when on RAUAAnticipatory Guidance / Discharge PlanningAdmit for weakness

## 2023-11-06 NOTE — ED Notes (Signed)
 Report Given ToHandoff completed on 11/06/2023 at 2:41 PM. Please call [phone] with questions.Pt to travel in hospital bed.Belvie SAUNDERS., RN Descriptive Sentence / Reason for Admission Hx renal carcinoma. radiation started yesterday today with worsening bilateral lower extremity weakness and pain. concern for disease progressionActive Issues / Relevant Events FULL CODEA/Ox4Hypotension, baseline reported (80s)1L NC, goal is RA (SpO2 >90)CXR: mild pulmonary edema Dialysis T/T/SWBC 22.5Continuous teleTo Do ListV/A per policyMeds per MARMRI (screening form done)Anticipatory Guidance / Discharge PlanningAdmit for weakness

## 2023-11-06 NOTE — Progress Notes (Signed)
 Ad[facility] NoteGreg [facility] Vital signs stable. Pain currently 0/10. PIV R arm, patent and no rednessAmbulation status: 2 Assist Patient oriented to call bell, room, unit, visiting hours, admission packet & menu. Toa Mia acknowledged understanding of current treatment plan.  Iwao Shamblin is currently resting with call bell in reach.Four eyed skin assessment completed with Kaitlyn Frasca, RN. Assessment findings include: No Dual signed height and weight completed with Kaitlyn Frasca, RNImportant patient belongings at bedside upon admission: Glasses, iphone, computers etc. Additional info in flowsheets.See doc flow sheets and MAR for full assessment and interventions.Signed by: Rosina Right, RN as of 11/06/2023 at 7:18 PM

## 2023-11-06 NOTE — H&P (View-Only) (Signed)
 Nephrology Hemodialysis Note- I personally saw Shane Pearson while on dialysis today to assess hemodynamic stability and ultrafiltration goal.Objective Current Vitals Vitals Range (24 Hours) BP 106/57   Pulse 88   Temp 36.3 C (97.3 F)   Resp 22   Ht 1.651 m (5' 5)   Wt 77.9 kg (171 lb 11.8 oz)   SpO2 94%   BMI 28.58 kg/m  BP: (70-111)/(34-58) Temp:  [35.7 C (96.3 F)-36.8 C (98.2 F)] Temp src: Temporal (08/27 1311)Heart Rate:  [88-111] Resp:  [16-31] SpO2:  [90 %-100 %] Height:  [165.1 cm (5' 5)] Weight:  [77.9 kg (171 lb 11.8 oz)]  I/Os Weight No intake/output data recorded. Last 4 Weights  11/06/23 0325 11/06/23 1330 Weight: 77.9 kg (171 lb 11.8 oz) 77.9 kg (171 lb 11.8 oz)  Physical Exam:Constitutional:General: Not in acute distressCVS: Rate and Rhythm: Normal ratePulmonary: Effort: No respiratory distress, LCTAB anteriorly Neuro: General: No focal deficit notedAccess: HDTC dialyzing well Lab Results Component Value Date  NA 139 11/06/2023  K 5.5 (H) 11/06/2023  CA 8.5 (L) 11/06/2023  PO4 4.0 11/05/2023  HGB 10.4 (L) 11/06/2023  A/P:Shane Pearson is a 63 y.o. 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 Who presented to the ER for worsening bilateral lower extremity weakness and pain. Concern for disease progression. Nephrology is managing dialysis and UF.ESRD on HD - Continue Nephrovite as above- Follow Chemistry- Dose adjust meds to CrCL <10- Electrolytes stable - Continue dialysis with the following parameters:Active Dialysis Orders Dialysis  Hemodialysis   Frequency: 1 Time   Start Date/Time: 11/06/23 1050    Number of Occurrences: 1 Occurrences   Order Questions:    Dialyzer F180    K/Ca (meq) 2K/2.5Ca    Na (meq) 138     Na Modeling None    Bicarb (meq) 35    Dialysate Temp (C) 36    BFR (mL/min) 400    DFR (mL/min) 1.5x BFR    Duration (hours) 2    EDW (kgs) EDW 75 Kg    Access CVC    Vital Signs Vitals signs at the start of treatment and every 30 minutes    Vital Signs Check temperature at the start of treatment and at the end of treatment    Monitoring May place patient on telemtry per nursing descretion for concerns regarding stability    Notify dialysis provider for Pulse Less Than 50    Notify dialysis provider for Pulse Greater Than 130    Notify dialysis provider for Temperature (Celsius) Above 38    Notify dialysis provider for Systolic Blood Pressure Below 90    Notify dialysis provider for Systolic Blood Pressure Above 819    Fluid removal (L) 2 L as tolerated: Keep SBP > 85-90    Fluid removal instructions Decrease fluid removal goal by 500 ml  if blood pressure is decreasing or HR increasing. Reassess response every 15 minutes.    Fluid removal instructions May increase fluid removal goal by 500 ml if blood pressure and HR are stable.  Reassess response every 15 minutes.    Fluid removal instructions Decrease fluid removal to minimum if call/hold parameters for BP and/or HR exceeded.  Reassess response every 15 minutes.    Fluid removal instructions If no BP/HR response in 15 minutes after notifying MD, give 200 ml isotonic saline.  Reassess response every 15 minutes. Renal osteodystrophy:  Calcium  and phos acceptable - Cont calcitriol - Cont sevelamer  Anemia: Continue EPO as per protocolHemodynamics: Stable BPs. UF today with HD as hemodynamics allow Dialysis is stable.  Plan for next HD per patient's usual schedule.[name], MBBSNephrology DivisionUniversity of Reedsville Medical CenterElectronically Signed on 11/06/2023 at 2:22 PM Nephrology Note 11/06/2023 10:20 AMConsulting Provider: Dr. Jearld Shallow Chief Complaint: Shortness of  Breath/Hypotension/WeaknessReason for Consult: Management of Dialysis History of Present Illness:Shane Pearson is a 63 y.o. 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 Who presented to the ER for worsening bilateral lower extremity weakness and pain. Concern for disease progression. Patient recently admitted 7/19-7/31 in the setting of diagnosing metastatic stage IV carcinoma likely renal cell in origin. Admitted 8/7-8/8 for weakness in lower extremities, multiple spiny bony metastases noted on MRI, no cauda equina or conus medullaris compression. He was discharged with a walker.He reports progressive weakness since discharge, he presented to his radiation therapy appointment with hypotension, SOB, and weakness which his Radiation oncologist advised him to go to the ED. He has been having increasingly SOB since Saturday when he had a shorter dialysis appointment than usual. He endorsed pain all over bur mainly over his L mid [place]. He has numbness and decreased sensation in both feet. He was doing well until he completed his steroid course.  He also reports feeling very SOB with exertion. He does not wear supplemental oxygen at home. He denies fevers, chills, or recent sick contacts.  He denies dysuria or frequency. Mild nausea but no worse than chronic for him, thinks this is mostly because he doesn't eat much. ER Course: Hypotensive (78/57), Tachycardic (111), RR 16, Temperature 96.6 F, and oxygen sats on room air 95%. EKG: Sinus tachycardia Labs: K+ 5.5, AG 20, WBC 22.5 Imaging: Chest x-ray Bilateral perihilar interstitial opacities consistent with mild pulmonary edema. Moderate left and trace right pleural fluid with bibasilar atelectasis. Nephrology was consulted for dialysis needs. He received dialysis at [facility]. He was last dialyze don Tuesday, his treatment was shortened due to left eye pain. Review of Systems 10 point ROS is otherwise negative except those mentioned abovePast Medical History[1]Past Surgical History[2]Family History[3]Social History Socioeconomic History  Marital status: Married Tobacco Use  Smoking status: Former   Packs/day: 0.30   Years: 15.00   Additional pack years: 0.00   Total pack years: 4.50   Types: Cigarettes   Quit date: 08/02/1992   Years since quitting: 31.2  Smokeless tobacco: Never Substance and Sexual Activity  Alcohol use: No  Drug use: No Allergies: Allergies[4]Prior to Admission Medications:(Not in a hospital admission)Current Meds:Current Facility-Administered Medications Medication Dose Route Frequency  aspirin  EC tablet 81 mg  81 mg Oral Daily  atorvastatin  (LIPITOR) tablet 20 mg  20 mg Oral Daily with dinner  B complex-vitamin C-folic acid  (NEPHRO-VITE) tablet 1 tablet  1 tablet Oral Daily  calcium -vitamin D  (OSCAL-500) 500mg -200unit per tablet 1 tablet  1 tablet Oral BID  cinacalcet  (SENSIPAR ) tablet 60 mg  60 mg Oral QPM  fluticasone  (FLONASE ) 50 MCG/ACT nasal spray 1 spray  1 spray Nasal Daily  pantoprazole  (PROTONIX ) EC tablet 40 mg  40 mg Oral QAM  acetaminophen  (TYLENOL ) tablet 650 mg  650 mg Oral Q4H PRN  melatonin tablet 3 mg  3 mg Oral QHS PRN  ondansetron  (ZOFRAN ) injection 4 mg  4 mg Intravenous Q6H PRN  senna (SENOKOT) tablet 1  tablet  1 tablet Oral Nightly  polyethylene glycol (GLYCOLAX ,MIRALAX ) powder 17 g  17 g Oral Daily PRN  ceFEPIme  (MAXIPIME ) 1,000 mg in dextrose  5% 58 mL IVPB  1,000 mg Intravenous Q24H  enoxaparin  (LOVENOX ) injection 30 mg  30 mg Subcutaneous Daily @ 2100  sodium chloride  0.9 % FLUSH REQUIRED IF PATIENT HAS IV  0-500 mL/hr Intravenous PRN  dextrose  5 % FLUSH REQUIRED IF PATIENT HAS IV  0-500 mL/hr Intravenous PRN  midodrine   (PROAMATINE ) tablet 10 mg  10 mg Oral TID  fludrocortisone  (FLORINEF ) tablet 0.1 mg  0.1 mg Oral TID Current Outpatient Medications Medication  acetaminophen  (TYLENOL ) 500 mg tablet  midodrine  (PROAMATINE ) 10 mg tablet  pantoprazole  (PROTONIX ) 40 mg EC tablet  magnesium  oxide (MAGNESIUM  OXIDE -MG SUPPLEMENT) 400 (241.3 mg) mg tablet  aspirin  81 MG EC tablet  fluticasone  (FLONASE ) 50 MCG/ACT nasal spray  atorvastatin  (LIPITOR) 20 mg tablet  fludrocortisone  (FLORINEF ) 0.1 mg tablet  b complex-vitamin c-folic acid  (NEPHRO-VITE) tablet  calcium -vitamin D  (OSCAL-500) 500-200 MG-UNIT per tablet  cinacalcet  (SENSIPAR ) 30 MG tablet  walker Physical Exam:Gen: Pleasant male, sitting up in bed in NAD Blood pressure (!) 81/58, pulse 97, temperature 36.5 C (97.7 F), resp. rate (!) 25, height 1.651 m (5' 5), weight 77.9 kg (171 lb 11.8 oz), SpO2 95%.Eyes: Conj pink; sclera non-icteric; glasses ENT:  MMM Chest: Normal WOB, Few rales noted. Supplemental oxygen via NC 2 L; A LIJ HDTC is present in the chest wallHeart: RRR, no G/R/M; no edema; extremities are well perfused; Left arm with old fistula Abdomen: Soft, nt, ndMusk: No hot or tender joints; normal gaitNeuro: Alert and oriented x 3; interactive; Moves all extremities, No focal deficits noted Skin:  No rashes or ulcers on visible skinRecent Labs Lab 08/26/251829 Sodium 140 Potassium 5.3* Chloride 90* CO2 28 UN 52* Creatinine 6.63* Glucose 74 Calcium  8.2* Albumin 3.4* Phosphorus 4.0 Magnesium  1.7   Component Value Date/Time  PTH 309.0 (H) 10/17/2023 1644  Recent Labs Lab 08/27/250917 08/26/251829 WBC 17.7* 22.5* Hemoglobin 10.4* 11.0* Hematocrit 35* 36* Platelets 270 294   Component Value Date/Time  FE 65 10/17/2023 1644  IBC 176 (L) 10/17/2023 1644  FESAT 37 10/17/2023 1644  FER 881 (H) 10/17/2023 1644  US  doppler vein LEFT lower  extremityResult Date: 8/27/2025No evidence of deep venous thrombosis in the left lower extremity. Hypoechoic nodules/nodes visualized in the left thigh, in the context of known metastatic disease. END OF IMPRESSION EKG 12 leadResult Date: 8/26/2025Sinus tachycardia Low voltage, extremity leads*Chest standard frontal and lateral viewsResult Date: 8/26/2025Bilateral perihilar interstitial opacities consistent with mild pulmonary edema. Moderate left and trace right pleural fluid with bibasilar atelectasis. END OF IMPRESSION I have personally reviewed the images and the Resident's/Fellow's interpretation and agree with or edited the findings.  Assessment/PlanGreg Pearson is a 63 y.o. 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 Who presented to the ER for worsening bilateral lower extremity weakness and pain. Concern for disease progression. 1. ESRD on HD TTS @ [facility]:- Follow chemistries - Renally dose medications for CrCl < 10 ml/min - Nephrovite 1 tab PO daily - Will perform 2 hour HD today mild hyperkalemia and fluid removal. - Will dialyze per prescription below: Active Dialysis Orders Dialysis  Hemodialysis   Frequency: 1 Time   Start Date/Time: 11/06/23 1050    Number of Occurrences: 1 Occurrences   Order Questions:  Dialyzer F180    K/Ca (meq) 2K/2.5Ca    Na (meq) 138    Na Modeling None    Bicarb (meq) 35    Dialysate Temp (C) 36    BFR (mL/min) 400    DFR (mL/min) 1.5x BFR    Duration (hours) 2    EDW (kgs) EDW 75 Kg    Access CVC    Vital Signs Vitals signs at the start of treatment and every 30 minutes    Vital Signs Check temperature at the start of treatment and at the end of treatment    Monitoring May place patient on telemtry per nursing  descretion for concerns regarding stability    Notify dialysis provider for Pulse Less Than 50    Notify dialysis provider for Pulse Greater Than 130    Notify dialysis provider for Temperature (Celsius) Above 38    Notify dialysis provider for Systolic Blood Pressure Below 90    Notify dialysis provider for Systolic Blood Pressure Above 819    Fluid removal (L) 2 L as tolerated: Keep SBP > 85-90    Fluid removal instructions Decrease fluid removal goal by 500 ml  if blood pressure is decreasing or HR increasing. Reassess response every 15 minutes.    Fluid removal instructions May increase fluid removal goal by 500 ml if blood pressure and HR are stable.  Reassess response every 15 minutes.    Fluid removal instructions Decrease fluid removal to minimum if call/hold parameters for BP and/or HR exceeded.  Reassess response every 15 minutes.    Fluid removal instructions If no BP/HR response in 15 minutes after notifying MD, give 200 ml isotonic saline.  Reassess response every 15 minutes. 2. Hyperkalemia: Mild - Should improve with dialysis - Please ensure patient is on a low K+ diet 3. Hemodynamics and Volume status:  Hypotension  - SBPs 81-93 - Continue Midodrine  10 mg TID and Florinef  0.1 mg TID - Will optimize fluid removal with dialysis as hemodynamics allow 4.  Anemia: goal 10-11 g/dl - Hgb within goal range, 10.4 - No ESA due to diagnosis of metastatic carcinoma of renal origin- Transfusion per primary team parameters - Trend H/H - Check iron stores 5. CKD BMD:- Calcium  is acceptable, 8.5 - Intact PTH on 10/17/23 - 309- Continue Oscal 500 1 tab BID- Cinacalcet  60 mg every evening - On no phos binders - Please check phosphate levels with next lab draw 6. Vascular Access:- LIJ HDTC Rest of care per primary team. Thank you for allowing our team to participate in the care of this patient. Please call or page with questions.  Author: Elige FORBES Schultze, NP  as of: 11/06/2023  at: 10:20 AMPager [phone] [1] Past Medical History:Diagnosis Date  Arthritis   CHF (congestive heart failure)   Colon polyp   Dialysis patient   ESRD (end stage renal disease)   GERD (gastroesophageal reflux disease)   Hypercholesterolemia   Hypotension   Ischemic colitis   Thyroid  disease   hypo [2] Past Surgical History:Procedure Laterality Date  DIALYSIS FISTULA CREATION Bilateral   4 fistulas have failed- ? d/t low BP and small veins  HERNIA REPAIR    PR LIGATION/BIOPSY TEMPORAL ARTERY Right 10/04/2023  Procedure: TEMPORAL ARTERY BIOPSY RIGHT EYE;  Surgeon: Lynn Ervin Edelman, MD;  Location: [facility] MAIN OR;  Service: Ophthalmology  Right inguinal hernia repair   [3] Family HistoryProblem Relation Name Age of Onset  Diabetes Mother    Asthma Mother    Coronary artery  disease Father   [4] AllergiesAllergen Reactions  Penicillins Swelling and Rash   Facial swellingMed History Tech discussed Penicillin allergy with patient on 10/18/23. Please see Antimicrobial Stewardship Chart Note for additional details.  Coconut Flavoring Agent (Non-Screening) Nausea And Vomiting   All sources coconut (including raw) --causes regurgitation   Amoxicillin Swelling and Rash   Facial swellingMed History Tech discussed Amoxicillin allergy with patient on 10/18/23. Please see Antimicrobial Stewardship Chart Note for additional details.  Baclofen Other (See Comments)   confusion  No Known Latex Allergy

## 2023-11-06 NOTE — Progress Notes (Signed)
 Report Given  Diagnostic Imaging Nurse HandoffStudy: MRINeuro Status:Alert and Oriented x3, can follow commands and is consentableTelemetry:Yes - Unit nurse will accompany patientIV Access:Hemodialysis Catheter:  Tunneled Double lumen Left Internal Jugular (Active) Insertion Site Description Clean;Dry 11/06/23 1600 Phlebitis Scale Grade 0 11/06/23 1600 Infiltration Scale Grade 0 11/06/23 1600 Proximal Lumen Status Flushed;Heparin  locked;Capped 11/06/23 1600 Distal Lumen Status Flushed;Heparin  locked;Capped 11/06/23 1600 Dressing Type Transparent CHG 11/06/23 1600 Dressing Status Clean, dry, and intact 11/06/23 1600 Dressing Change New 11/06/23 1600 (T)Transparent Drsg Change- (Every 7 Days) Dressing changed 11/06/23 1600 Dressing Change Due 11/13/23 11/06/23 1600 Needleless Access Device(s) Changed? Yes 11/06/23 1600 Needleless Access Device(s) Change Due 11/08/23 11/06/23 1600 Heparin /TPA Dwell Yes - Heparin  11/06/23 1600 Freq of line accesses and lab draws addressed? Yes 11/06/23 1600 Extravasation No 11/06/23 1600 Comments High Arterial pressures, lines reversed during tx 11/06/23 1600   Peripheral IV 11/05/23 1830 Ultrasound guided Anterior;Distal;Right Upper arm (Active) Phlebitis Scale Grade 0 11/06/23 1758 Infiltration Scale Grade 0 11/06/23 1758 Line Status Blood return noted;Flushed;Capped 11/06/23 1758 Dressing Type Transparent 11/06/23 1758 Dressing Status Clean, dry, and intact 11/06/23 1758 Dressing Change Due 11/12/23 11/06/23 1758 Extravasation No 11/06/23 1758 Continuous Infusions:NoArterial line/transducer present:No.Additional lines and drains: NoneMedication Patches/Insulin  Pumps:NoPrecautions:None (Universal)Patient Readiness for MRI:: Patient dressed in MRI safe scrubs or gown Transport:BedHover Requested:No, Patient can ambulate Independently Respiratory:O2 Nasal Cannula 1 litersSleep Apnea:NoClaustrophobic:  NoPain Concerns:NoSedation/Pain/Medication Plan :NAAble to [employer] for the Duration of the Scan:YesCode status: Full CodeAlexander Aldyn Toon, RN Received Handoff Report from bedside RN for MRI 10:42 PM Procedure Summary

## 2023-11-06 NOTE — ED Notes (Signed)
 Writer observed pt BP to be 82/57. Covering provider [place], [place] notified of situation.

## 2023-11-06 NOTE — Progress Notes (Signed)
 Report Given ToReport given to Leigh, [facility] difficulties. Patient had 2 Hours of treatment , 1.5 Liters net fluid removed. Patient Tolerated treatment. Soft Bp's but pt did well. Pt stable post tx   Descriptive Sentence / Reason for Admission Duration of Treatment (minutes): 120 minutesComplications: High Arterial pressures, lines reversed and BFR lowered to 350Active Issues / Relevant Events Review of medications administered  such as routine meds, antibiotics, and heparin : Heparin  Dwells Vital Signs: Please See Flowsheets To Do ListMedications still needing administration: None r/t HDDressing change due :9/3/2025Anticipatory Guidance / Discharge PlanningDate/Time of Next Treatment: TBD by NephrologyPlease weigh the pt daily to Monitor for fluid balance.Harlene Rower, RN

## 2023-11-07 ENCOUNTER — Encounter

## 2023-11-07 ENCOUNTER — Inpatient Hospital Stay

## 2023-11-07 ENCOUNTER — Other Ambulatory Visit

## 2023-11-07 ENCOUNTER — Ambulatory Visit

## 2023-11-07 DIAGNOSIS — D631 Anemia in chronic kidney disease: Secondary | ICD-10-CM

## 2023-11-07 DIAGNOSIS — M2428 Disorder of ligament, vertebrae: Secondary | ICD-10-CM

## 2023-11-07 DIAGNOSIS — C7989 Secondary malignant neoplasm of other specified sites: Secondary | ICD-10-CM

## 2023-11-07 DIAGNOSIS — M899 Disorder of bone, unspecified: Secondary | ICD-10-CM

## 2023-11-07 DIAGNOSIS — M51369 Other intervertebral disc degeneration, lumbar region without mention of lumbar back pain or lower extremity pain: Secondary | ICD-10-CM

## 2023-11-07 DIAGNOSIS — M48061 Spinal stenosis, lumbar region without neurogenic claudication: Secondary | ICD-10-CM

## 2023-11-07 DIAGNOSIS — M47816 Spondylosis without myelopathy or radiculopathy, lumbar region: Secondary | ICD-10-CM

## 2023-11-07 DIAGNOSIS — C801 Malignant (primary) neoplasm, unspecified: Secondary | ICD-10-CM

## 2023-11-07 DIAGNOSIS — M4802 Spinal stenosis, cervical region: Secondary | ICD-10-CM

## 2023-11-07 DIAGNOSIS — N186 End stage renal disease: Secondary | ICD-10-CM

## 2023-11-07 DIAGNOSIS — N289 Disorder of kidney and ureter, unspecified: Secondary | ICD-10-CM

## 2023-11-07 DIAGNOSIS — N250 Renal osteodystrophy: Secondary | ICD-10-CM

## 2023-11-07 DIAGNOSIS — E8770 Fluid overload, unspecified: Secondary | ICD-10-CM

## 2023-11-07 DIAGNOSIS — Z992 Dependence on renal dialysis: Secondary | ICD-10-CM

## 2023-11-07 DIAGNOSIS — C7951 Secondary malignant neoplasm of bone: Secondary | ICD-10-CM

## 2023-11-07 LAB — BASIC METABOLIC PANEL
Anion Gap: 21 — ABNORMAL HIGH (ref 7–16)
CO2: 23 mmol/L (ref 20–28)
Calcium: 7.8 mg/dL — ABNORMAL LOW (ref 8.6–10.2)
Chloride: 94 mmol/L — ABNORMAL LOW (ref 96–108)
Creatinine: 6.21 mg/dL — ABNORMAL HIGH (ref 0.67–1.17)
Glucose: 98 mg/dL (ref 60–99)
Lab: 45 mg/dL — ABNORMAL HIGH (ref 6–20)
Potassium: 5.1 mmol/L (ref 3.3–5.1)
Sodium: 138 mmol/L (ref 133–145)
eGFR BY CREAT: 9 — AB

## 2023-11-07 LAB — MAGNESIUM: Magnesium: 1.6 mg/dL (ref 1.6–2.5)

## 2023-11-07 LAB — CBC
Hematocrit: 35 % — ABNORMAL LOW (ref 37–52)
Hemoglobin: 10.3 g/dL — ABNORMAL LOW (ref 12.0–17.0)
MCV: 91 fL (ref 75–100)
Platelets: 265 THOU/uL (ref 150–450)
RBC: 3.8 MIL/uL — ABNORMAL LOW (ref 4.0–6.0)
RDW: 24.4 % — ABNORMAL HIGH (ref 0.0–15.0)
WBC: 15.8 THOU/uL — ABNORMAL HIGH (ref 3.5–11.0)

## 2023-11-07 LAB — PHOSPHORUS: Phosphorus: 4.1 mg/dL (ref 2.7–4.5)

## 2023-11-07 MED ORDER — MIDODRINE HCL 5 MG PO TABS *I*
ORAL_TABLET | ORAL | Status: AC
Start: 2023-11-07 — End: 2023-11-07
  Filled 2023-11-07: qty 2

## 2023-11-07 MED ORDER — MIDODRINE HCL 5 MG PO TABS *I*
10.0000 mg | ORAL_TABLET | Freq: Once | ORAL | Status: DC
Start: 2023-11-07 — End: 2023-11-07
  Filled 2023-11-07: qty 2

## 2023-11-07 MED ORDER — SODIUM CHLORIDE 0.9 % IV BOLUS *I*
500.0000 mL | Freq: Once | Status: AC
Start: 2023-11-07 — End: 2023-11-07
  Administered 2023-11-07: 500 mL via INTRAVENOUS

## 2023-11-07 MED ORDER — SODIUM CHLORIDE 0.9 % IV BOLUS *I*
500.0000 mL | Freq: Once | Status: DC
Start: 2023-11-07 — End: 2023-11-07

## 2023-11-07 MED ORDER — GADOPICLENOL 0.5 MMOL/ML (VUEWAY) IV SOLN *I*
0.1000 mL/kg | Freq: Once | INTRAVENOUS | Status: AC
Start: 2023-11-07 — End: 2023-11-07
  Administered 2023-11-07: 7.64 mL via INTRAVENOUS

## 2023-11-07 MED ORDER — HYDROMORPHONE HCL PF 1 MG/ML IJ SOLN *WRAPPED*
INTRAMUSCULAR | Status: AC
Start: 2023-11-07 — End: 2023-11-07
  Filled 2023-11-07: qty 0.5

## 2023-11-07 MED FILL — Gadopiclenol IV Soln 0.5 MMOL/ML: INTRAVENOUS | Qty: 5.36 | Status: AC

## 2023-11-07 NOTE — Progress Notes (Signed)
 Report Given ToReport given to Bedside RN Patient completed 4hrs of tx, 2 Liters net fluid removed.  Descriptive Sentence / Reason for Admission Duration of Treatment (minutes): 240 minutesComplications: noneActive Issues / Relevant Events Dialysis weight change (kg): -2 kgReview of medications administered  such as routine meds, antibiotics, and heparin : midodrine , heparin  dwellsVital Signs:  see flowsheetTo Do ListMedications still needing administration: None r/t HDDressing change due :9/3/2025Anticipatory Guidance / Discharge PlanningDate/Time of Next Treatment: TBD by NephrologyGloria Wyona Chancy, RN

## 2023-11-07 NOTE — Progress Notes (Signed)
 Interventional Radiology Pre-Procedure Handoff/ChecklistNPO:  NoTube feed Stopped: N/AAnticoagulants:ASA & lovenox  Telemetry: YesTransport mode: Bed  Number of Transporters needed: 2  Hover Mat:  N/AIs patient changed into a hospital gown? YesReminded floor RN to have patient remove jewelry and leave valuables on the unit: YesIV access: Hemodialysis Catheter:  Tunneled Double lumen Left Internal Jugular (Active) Insertion Site Description Clean;Dry 11/07/23 1230 Phlebitis Scale Grade 0 11/07/23 1230 Infiltration Scale Grade 0 11/07/23 1230 Proximal Lumen Status Flushed;Heparin  locked;Capped 11/07/23 1230 Distal Lumen Status Flushed;Heparin  locked;Capped 11/07/23 1230 Dressing Type Transparent CHG 11/07/23 1230 Dressing Status Clean, dry, and intact 11/07/23 1230 Dressing Change New 11/06/23 1600 (T)Transparent Drsg Change- (Every 7 Days) Dressing changed 11/06/23 1600 Dressing Change Due 11/13/23 11/07/23 1230 Needleless Access Device(s) Changed? Yes 11/07/23 1230 Needleless Access Device(s) Change Due 11/09/23 11/07/23 1230 Heparin /TPA Dwell Yes - Heparin  11/07/23 1230 Freq of line accesses and lab draws addressed? Yes 11/07/23 1230 Extravasation No 11/07/23 1230 Comments High Arterial pressures, lines reversed during tx 11/06/23 1600   Peripheral IV 11/05/23 1830 Ultrasound guided Anterior;Distal;Right Upper arm (Active) Phlebitis Scale Grade 0 11/07/23 0000 Infiltration Scale Grade 0 11/07/23 0000 Line Status Flushed;Blood return noted;Normal Saline locked;Capped 11/07/23 0000 Dressing Type Transparent 11/07/23 0000 Dressing Status Clean;Dry;Intact 11/07/23 0000 Dressing Change Due 11/12/23 11/06/23 1758 Extravasation No 11/06/23 1758 Respiratory: O2 Nasal Cannula 2 litersConsentable:yesAlert and Oriented to person, place and time?: yesCode Status: Full CodeDoes patient wear an insulin  pump? N/APrecautions:noneAllergies:  Allergies[1]Sinthia Karabin Onita, RN Received Handoff Report from [facility] for IR procedure 1:56 PM [1] AllergiesAllergen Reactions  Penicillins Swelling and Rash   Facial swellingMed History Tech discussed Penicillin allergy with patient on 10/18/23. Please see Antimicrobial Stewardship Chart Note for additional details.  Coconut Flavoring Agent (Non-Screening) Nausea And Vomiting   All sources coconut (including raw) --causes regurgitation   Amoxicillin Swelling and Rash   Facial swellingMed History Tech discussed Amoxicillin allergy with patient on 10/18/23. Please see Antimicrobial Stewardship Chart Note for additional details.  Baclofen Other (See Comments)   confusion  No Known Latex Allergy

## 2023-11-07 NOTE — Progress Notes (Signed)
 Per RN, patient currently hypotensive while supine. Providers are aware. PT will hold of at this time as patient is likely to continue to have a drop in BP and he is also currently eating lunch. Darina Boas, PTA RYTPlease contact via secure chat or VoceraOn weekends, please utilize the secure chat group, [facility] 1st call, to contact PT

## 2023-11-07 NOTE — Plan of Care (Signed)
 I visited Shane Pearson this afternoon.  His pain and symptom are better controlled.  I again reviewed plan with him about re-biopsy to obtain sample to ascertain histology and location of primary tumor.  Also, I reviewed with [name] his MRI spine result.[name] appreciated follow up.Sabastien Tyler Bronson, MDProfessor of Medicine and Hydrographic surveyor of Solid Tumor Oncology ProgramJames P. La Grange Cancer InstituteDivision of Hematology/OncologyDepartment of Medicine

## 2023-11-07 NOTE — Progress Notes (Signed)
 Nephrology Hemodialysis Note- I personally saw Shane Pearson while on dialysis today to assess hemodynamic stability and ultrafiltration goal.Objective Current Vitals Vitals Range (24 Hours) BP 101/55   Pulse 90   Temp 36.2 C (97.2 F)   Resp 18   Ht (S) 1.575 m (5' 2.01) Comment: d/s with Kaitlyn Frsca  Wt 76.7 kg (169 lb 1.5 oz)   SpO2 99%   BMI 30.92 kg/m  BP: (70-127)/(13-88) Temp:  [35.7 C (96.3 F)-37.8 C (100 F)] Temp src: Oral (08/28 0738)Heart Rate:  [87-100] Resp:  [16-27] SpO2:  [85 %-100 %] Height:  [157.5 cm (5' 2.01)] Weight:  [76.4 kg (168 lb 6.9 oz)-77.9 kg (171 lb 11.8 oz)]  I/Os Weight I/O last 3 completed shifts:08/27 0700 - 08/28 0659In: 482.6 (6.3 mL/kg) [I.V.:400 (0.2 mL/kg/hr); IV Piggyback:82.6]Out: 1900 (24.9 mL/kg) [Other:1900]Net: -1417.4Weight: 76.4 kg  Last 4 Weights  11/06/23 1330 11/06/23 1829 11/07/23 0738 11/07/23 0815 Weight: 77.9 kg (171 lb 11.8 oz) (S) 76.4 kg (168 lb 6.9 oz) 76.7 kg (169 lb 1.5 oz) 76.7 kg (169 lb 1.5 oz)  Physical Exam:Constitutional:General: Not in acute distressCVS: Rate and Rhythm: Normal ratePulmonary: Effort: No respiratory distress, LCTAB anteriorly Neuro: General: No focal deficit notedAccess: HDTC dialyzing well Lab Results Component Value Date  NA 138 11/07/2023  K 5.1 11/07/2023  CA 7.8 (L) 11/07/2023  PO4 4.1 11/07/2023  HGB 10.3 (L) 11/07/2023  A/P:Shane Pearson is a 63 y.o. 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 Who presented to the ER for worsening bilateral lower extremity weakness and pain. Concern for disease progression. Nephrology is managing dialysis and UF.ESRD on HD - Continue Nephrovite as above- Follow Chemistry- Dose adjust meds to CrCL <10- Electrolytes  stable - Continue dialysis with the following parameters:Active Dialysis Orders Dialysis  Hemodialysis   Frequency: 1 Time   Start Date/Time: 11/06/23 1050    Number of Occurrences: 1 Occurrences   Order Questions:    Dialyzer F180    K/Ca (meq) 2K/2.5Ca    Na (meq) 138    Na Modeling None    Bicarb (meq) 35    Dialysate Temp (C) 36    BFR (mL/min) 400    DFR (mL/min) 1.5x BFR    Duration (hours) 2    EDW (kgs) EDW 75 Kg    Access CVC    Vital Signs Vitals signs at the start of treatment and every 30 minutes    Vital Signs Check temperature at the start of treatment and at the end of treatment    Monitoring May place patient on telemtry per nursing descretion for concerns regarding stability    Notify dialysis provider for Pulse Less Than 50    Notify dialysis provider for Pulse Greater Than 130    Notify dialysis provider for Temperature (Celsius) Above 38    Notify dialysis provider for Systolic Blood Pressure Below 90    Notify dialysis provider for Systolic Blood Pressure Above 819    Fluid removal (L) 2 L as tolerated: Keep SBP > 85-90    Fluid removal instructions Decrease fluid removal goal by 500 ml  if blood pressure is decreasing or HR increasing. Reassess response every 15 minutes.    Fluid removal instructions May increase fluid removal goal by 500 ml if blood pressure and HR are stable.  Reassess response every 15 minutes.    Fluid removal instructions Decrease  fluid removal to minimum if call/hold parameters for BP and/or HR exceeded.  Reassess response every 15 minutes.    Fluid removal instructions If no BP/HR response in 15 minutes after notifying MD, give 200 ml isotonic saline.  Reassess response every 15 minutes.  Hemodialysis   Frequency: Every Tues,Thurs & Sat   Start Date/Time: 11/07/23 0600    Number of Occurrences: Until Specified   Order Questions:    Dialyzer F180    K/Ca (meq) 2K/2.5Ca    Na (meq) 138    Na  Modeling None    Bicarb (meq) 35    Dialysate Temp (C) 36    BFR (mL/min) 400    DFR (mL/min) 1.5x BFR    Duration (hours) 4    EDW (kgs) EDW 75 KG    Access CVC    Vital Signs Vitals signs at the start of treatment and every 30 minutes    Vital Signs Check temperature at the start of treatment and at the end of treatment    Monitoring May place patient on telemtry per nursing descretion for concerns regarding stability    Notify dialysis provider for Pulse Less Than 50    Notify dialysis provider for Pulse Greater Than 130    Notify dialysis provider for Temperature (Celsius) Above 38    Notify dialysis provider for Systolic Blood Pressure Below 90    Notify dialysis provider for Systolic Blood Pressure Above 819    Fluid removal (L) 1-2.5 L as tolerated; Keep SBP 85-90    Fluid removal instructions Decrease fluid removal goal by 500 ml  if blood pressure is decreasing or HR increasing. Reassess response every 15 minutes.    Fluid removal instructions May increase fluid removal goal by 500 ml if blood pressure and HR are stable.  Reassess response every 15 minutes.    Fluid removal instructions Decrease fluid removal to minimum if call/hold parameters for BP and/or HR exceeded.  Reassess response every 15 minutes.    Fluid removal instructions If no BP/HR response in 15 minutes after notifying MD, give 200 ml isotonic saline.  Reassess response every 15 minutes. Renal osteodystrophy: Calcium  and phos acceptable - Cont calcitriol - Cont sevelamer  Anemia: Continue EPO Hemodynamics: Stable BPs. UF today with HD as hemodynamics allow Dialysis is stable.  Plan for next HD per patient's usual schedule.[name], MBBSNephrology DivisionUniversity of Tulare Medical CenterElectronically Signed on 11/07/2023 at 8:43 AM

## 2023-11-07 NOTE — Progress Notes (Addendum)
 Called by RN for BP 60's systolic. Patient alert, oriented, able to speak.BP difficult to get, had to use doppler and was done in the legPatient asymptomatic, no headache, no chest pain, no blurry vision or palpitations Will give 500cc bolus and recheck manual BP Can give more fluid back if not improving, he had HD with 2L taken off today.

## 2023-11-07 NOTE — Consults (Signed)
 Medical Nutrition Therapy- Brief NoteFood Allergy ClarificationPatient was visited to clarify/review his allergy to coconut. 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 A note will be made in food service software and patient will be reminded to avoid ordering meal choices containing coconut.Shane Pearson A. Pati PhD, RD, CNSCAssessment type:Other

## 2023-11-07 NOTE — Plan of Care (Signed)
 Interventional Radiology Preprocedure NoteReferring Provider: Olivia Germaine Dowdy, MDGreg Pearson is a 63 y.o. 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 The bedside nurse will be contacted once scheduled.Plan for minimal sedationPreprocedural Requirements: Need to hold prophylactic DVT medications? No Are labs up to date (INR, plts, COVID)? Yes Does patient need to be NPO No Contrast allergy? NoLink to PolicySTAT - Reaction Severity and Premedication Bleeding concerns requiring medication hold (anticoagulants, anti-platelets) and plan? NoHold None Labs:  Lab results: 08/28/250309 WBC 15.8* Hemoglobin 10.3* Hematocrit 35* RBC 3.8* Platelets 265    Lab results: 07/21/251943 INR 1.4* Recent Results (from the past 744 hours) COVID/Influenza A & B/RSV NAAT (PCR)  Collection Time: 11/06/23  4:41 AM Result Value Ref Range Status  COVID-19 Source  Nasopharyngeal  Final  COVID-19 NAAT (PCR) NEGATIVE Negative Final  Influenza A NAAT (PCR) NEGATIVE Negative Final  Influenza B NAAT (PCR) NEGATIVE Negative Final  RSV NAAT (PCR) NEGATIVE Negative Final Please page IR CONSULT Pager with questions.Signed by Manuelita Jenkins Haff, PA on 11/07/2023 at 10:04 AM

## 2023-11-07 NOTE — Progress Notes (Addendum)
 The student was personally supervised by me during the patient examination. I personally saw and evaluated the patient, provided the medical decision-making, and reviewed and verified the key elements of the student documentation. I have edited the student's note and confirm the findings and plan of care as documented. Hematology Oncology Hospitalist Progress Note  LOS: 1 day  Full Code Significant Events/Subjective: No acute overnight events.  Shane Pearson received his MRI of the spine yesterday. This morning, I was in to see Shane Pearson while he was getting his daily weights.  He was frustrated that he was getting his daily weights, however they were not being checked before and after dialysis.  He states he would like objective measurement on how much fluid is being taken off him, believes that that is not being done right now.  Otherwise he feels that his symptoms have perhaps slightly improved. He is concerned about the Dilaudid  that he received before the MRI, as he feels that has made him weak. He states that his pain is well controlled and has not required additional pain meds. While we are speaking he was on 2 L of oxygen, however his nurse stated that he has been on 1 L, and that he was raised up to 2 L in anticipation of movement to get his weights. Review of Systems Constitutional:  Positive for malaise/fatigue and weight loss. Negative for fever. Eyes:  Positive for blurred vision and double vision. Respiratory:  Positive for shortness of breath. Negative for cough and hemoptysis.  Cardiovascular:  Positive for orthopnea and leg swelling. Negative for chest pain and palpitations. Neurological:  Positive for tingling, sensory change and weakness. Negative for dizziness, speech change and headaches. Objective: Physical ExamTemp:  [35.7 C (96.3 F)-37.8 C (100 F)] 36.8 C (98.3 F)Heart Rate:  [87-100] 97Resp:  [16-27] 24BP: (70-111)/(13-63) 85/6276.7 kg (169  lb 1.5 oz)Physical ExamConstitutional:     General: He is not in acute distress.   Appearance: He is ill-appearing. HENT:    Head: Normocephalic and atraumatic. Cardiovascular:    Rate and Rhythm: Normal rate and regular rhythm. Pulmonary:    Effort: Pulmonary effort is normal. No respiratory distress.    Comments: On 2L O2Abdominal:    General: Bowel sounds are normal.    Tenderness: There is no abdominal tenderness. Neurological:    Mental Status: He is alert and oriented to person, place, and time.    Cranial Nerves: No cranial nerve deficit.    Motor: Weakness (b/l LE antigravity) present. Recent Lab Studies:Personally reviewed and notable qnm:[facility] Lab 08/28/250309 08/27/250917 08/26/251829 WBC 15.8* 17.7* 22.5* Hemoglobin 10.3* 10.4* 11.0* Hematocrit 35* 35* 36* Platelets 265 270 294  Recent Labs Lab 08/28/250309 08/27/250917 08/26/251829 Sodium 138 139 140 Potassium 5.1 5.5* 5.3* Chloride 94* 92* 90* CO2 23 27 28  UN 45* 63* 52* Creatinine 6.21* 7.56* 6.63* Glucose 98 78 74 Calcium  7.8* 8.5* 8.2* Albumin  --   --  3.4* Phosphorus 4.1  --  4.0 No results for input(s): APTT, INR, PTT, PTI in the last 168 hours.Recent Imaging Studies:Personally reviewed and notable for:MRI C-spine: Evaluation limited by motion artifact.  Enhancing lesions in multiple cervical vertebrae suspect for metastatic disease similar to prior MRI of 10/17/2023.  Mild epidural enhancement at the C7 level causing mild narrowing of the spinal canal.  No appreciable abnormality of the cervical spinal cord.  Refer to separately reported concurrent MRI of the thoracic spine. MRI T-spine:Evaluation limited by motion artifact. Multiple enhancing metastases in the thoracic vertebrae. The lesions  in the T6 vertebral body and T7 spinous process appear to have enlarged. No compromise of the spinal  canal or thoracic spinal cord.  Multiple metastatic lesions in the paraspinal muscles have enlarged. MRI L-spine: Evaluation is limited motion artifact.  Multiple enhancing metastases in all the lumbar vertebral bodies similar to prior MRI.  The L4-L5 level has spondylotic disc bulge, facet hypertrophy, and thickening of the ligament flavum causing mild to moderate stenosis of the spinal canal and moderate right and moderate to severe left neural foraminal stenosis.  Otherwise, no compression of the cauda equina.  Multiple enhancing metastases in the posterior paraspinal muscles similar to prior MRI. Current Inpatient Medications: aspirin   81 mg Oral Daily  atorvastatin   20 mg Oral Daily with dinner  B complex-vitamin C-folic acid   1 tablet Oral Daily  calcium -vitamin D   1 tablet Oral BID  cinacalcet   60 mg Oral QPM  fluticasone   1 spray Nasal Daily  pantoprazole   40 mg Oral QAM  senna  1 tablet Oral Nightly  ceFEPime  (MAXIPIME ) IV  1,000 mg Intravenous Q24H  enoxaparin   30 mg Subcutaneous Daily @ 2100  midodrine   10 mg Oral TID  fludrocortisone   0.1 mg Oral TID acetaminophen , melatonin, ondansetron , polyethylene glycol, heparin  (porcine), heparin  (porcine), sodium chloride , dextroseAssessment: Shane Pearson is a 63 year old male with past medical history of 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  Clinically he appears better in terms of pain control as well as the weakness that was documented.  MRI is also reassuring in terms of concern for cord compression, however there is progression of his metastatic disease.  We will consult interventional radiology to get a biopsy of one of the paraspinal metastases,  which will be used for further sampling/testing. He did not get radiation yesterday, radiation to the cervical spine will continue today.  In terms of pain control, he reports that he does not have any pain and does not want additional pain medications.  In fact, he raises concerns that he feels sedated and tired from the hydromorphone  that he received for the MRI yesterday. From a shortness of breath standpoint, we have been able to wean him down to 1 L of oxygen, he seems improved from yesterday.  The etiology is likely CHF, as we have a low suspicion for any infection right now.  We suspect that the white blood cell count is likely an elevation but the steroids are responsible for.  He had a short ultrafiltration session yesterday with 1.5 L removed. he will have HD today, which will help with some fluid removal, which hopefully further improves his shortness of breath.  He has been hypotensive, but given the concern for CHF, we have been careful to give any fluids.  He would likely benefit from an updated echocardiogram during this admission. Plans: ONC: Stage IV carcinoma, likely renal cell- Metastasis to brain and bone- Followed outpatient by Dr. Bronson, - Last treated with 2 of 5 fractions of radiation to the cervical spine, can call rad onc in the AM- Dr. Bronson would also recommend urgent inpatient biopsy of one of the paraspinal muscular lesion Worsening lower extremity weakness-MRI spine imaging from yesterday showing the lesions in T6 vertebral body, and T7 spinous processes have enlarged.  There are multiple metastatic lesions in the paraspinal muscles have  enlarged.  No appreciable abnormality of the cervical spinal cord, there is mild narrowing of the spinal canal at the C7 level.  C2 C4 and C7 bony mets are similar to prior MRI on 8/7.  Diffuse bony mets of the L-spine, sacrum.  No compression of cauda equina. - Will continue to work on pain management, however pain management  appears to be adequate right now - Did not receive radiation to cervical spine yesterday-B12 normal on 8/7, iron panel without deficiency- TSH 7.09 with mildly low free T4 at 0.6 - Will pursue IR guided biopsy of the paraspinal muscular lesions- No evidence of DVT in LLE - discussed with oncology as well, will consult Neurosurgery with the findings on repeat MRI to see if any intervention can be offered.  Acute hypoxic respiratory failure with hypotension most likely in setting of CHF; c/f sepsis low for now -Pressures have been low, ranging from 80-100/ 55s - these appear to be his baseline and is asymptomatic- Is not on baseline oxygen, on 2 L NC with SpO2 93%- Bcx NGTD - COVID/Flu/RSV negative -WBC 15.8(17.7) -low concern for infection right now, elevation could be in the setting of recent steroids- On empiric cefepime , will discontinue today given low clinical concern of infection -CXR showing mild pulmonary edema, moderate left and trace right pleural fluid- Underwent a short hemodialysis session yesterday for fluid removal with 2 L-  Will resume HD today as scheduled on TTS schedule- Cont home Midodrine  10mg  TID & Florinef  TID -give before dialysis session- We will consider updating echocardiogram(last echo in 2019 with preserved EF of 65%), grade 2 diastolic dysfunction - repeat ECHO ordered- Aspirin  81mg  - Lipitor 20mg   Chronic Medical Problems:End-stage renal disease on HD TTS-Nephrology consulted for HD-Short UF session today; regularly scheduled HD tomorrow-Nephro-Vite-Cinacalcet - Blood pressures labile with dialysis requiring midodrine  and florinef  before sessions. BP low after HD but patient stable.  GERD- Pantoprazole  40mg  daily FEN: Regular diet DVT PPx: Prophylactic Lovenox  Code Status: Full CodeDischarge Planning: (EDD):  tbdDischarge Criteria/Barriers to Discharge: pending workupPT and/or OT  Recommendations/Discharge to: pending Appointments Needed with: Oncology Case discussed with Dr. Olivia, nephrology, radaition Shane Pearson on 11/07/2023 at 7:50 AMCase discussed with MS4, oncology, will consult neurosurgery I personally spent >50 minutes on the calendar day of the encounter, including pre and post visit work,

## 2023-11-08 ENCOUNTER — Encounter

## 2023-11-08 ENCOUNTER — Inpatient Hospital Stay

## 2023-11-08 ENCOUNTER — Ambulatory Visit

## 2023-11-08 ENCOUNTER — Other Ambulatory Visit: Payer: Self-pay

## 2023-11-08 DIAGNOSIS — C649 Malignant neoplasm of unspecified kidney, except renal pelvis: Secondary | ICD-10-CM

## 2023-11-08 DIAGNOSIS — M7989 Other specified soft tissue disorders: Secondary | ICD-10-CM

## 2023-11-08 DIAGNOSIS — G9520 Unspecified cord compression: Secondary | ICD-10-CM

## 2023-11-08 DIAGNOSIS — Q613 Polycystic kidney, unspecified: Secondary | ICD-10-CM

## 2023-11-08 LAB — BASIC METABOLIC PANEL
Anion Gap: 19 — ABNORMAL HIGH (ref 7–16)
CO2: 23 mmol/L (ref 20–28)
Calcium: 8.2 mg/dL — ABNORMAL LOW (ref 8.6–10.2)
Chloride: 98 mmol/L (ref 96–108)
Creatinine: 4.22 mg/dL — ABNORMAL HIGH (ref 0.67–1.17)
Glucose: 129 mg/dL — ABNORMAL HIGH (ref 60–99)
Lab: 23 mg/dL — ABNORMAL HIGH (ref 6–20)
Potassium: 4.1 mmol/L (ref 3.3–5.1)
Sodium: 140 mmol/L (ref 133–145)
eGFR BY CREAT: 15 — AB

## 2023-11-08 LAB — RAD ONC ARIA SESSION SUMMARY
Course Elapsed Days: 4
Course Fractions Treated: 3
Plan Fractions Treated to Date: 3
Plan Prescribed Dose Per Fraction: 400 cGY
Plan Total Prescribed Dose: 2000 cGy
Prescribed Total Fractions for Plan - Total: 5
Reference Point Dosage Given to Date: 1200 cGy
Reference Point Dosage Given to Date: 1213.18 cGy
Reference Point Session Dosage Given: 400 cGy
Reference Point Session Dosage Given: 404.39 cGy
Session Number: 3

## 2023-11-08 LAB — MAGNESIUM: Magnesium: 1.7 mg/dL (ref 1.6–2.5)

## 2023-11-08 LAB — CBC
Hematocrit: 35 % — ABNORMAL LOW (ref 37–52)
Hemoglobin: 10 g/dL — ABNORMAL LOW (ref 12.0–17.0)
MCV: 93 fL (ref 75–100)
Platelets: 229 THOU/uL (ref 150–450)
RBC: 3.7 MIL/uL — ABNORMAL LOW (ref 4.0–6.0)
RDW: 24.5 % — ABNORMAL HIGH (ref 0.0–15.0)
WBC: 12.2 THOU/uL — ABNORMAL HIGH (ref 3.5–11.0)

## 2023-11-08 LAB — PHOSPHORUS: Phosphorus: 3.3 mg/dL (ref 2.7–4.5)

## 2023-11-08 MED ORDER — MAGNESIUM SULFATE 2 GM IN 50 ML *WRAPPED*
2000.0000 mg | Freq: Once | INTRAVENOUS | Status: AC
Start: 2023-11-08 — End: 2023-11-08
  Administered 2023-11-08: 2000 mg via INTRAVENOUS
  Filled 2023-11-08: qty 50

## 2023-11-08 MED ORDER — LIDOCAINE HCL 1 % IJ SOLN *I*
INTRAMUSCULAR | Status: AC
Start: 2023-11-08 — End: 2023-11-08
  Filled 2023-11-08: qty 20

## 2023-11-08 MED ORDER — ADJUSTABLE COMMODE 3-IN-1 MISC *A*
0 refills | Status: AC
Start: 2023-11-08 — End: ?

## 2023-11-08 MED ORDER — LIDOCAINE HCL 1 % IJ SOLN *I*
Freq: Once | INTRAMUSCULAR | Status: AC | PRN
Start: 2023-11-08 — End: 2023-11-08
  Administered 2023-11-08: 8 mL via SUBCUTANEOUS

## 2023-11-08 NOTE — Discharge Summary (Signed)
 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 Given concerns of spinal cord compression, we got an MRI which was reassuring against cord compression, but concern for disease progression with larger mets. Neurosurgery consulted to see if any intervention could be done in the setting of the metastases that are progressing on MRI, however no intervention to be done. Soon after admission, his weakness improved, and he also never complained of any pain. IR biopsied the  paraspinal metastases today for further sampling/testing. His SOB was likely due to pulmonary edema, which was seen on CXR with interstitial infiltrates. His SOB improved with a short UF session with 1.5L removed, and full HD session with 2L removed the next day. He was then weaned back off oxygen. Post-Hospitalization To-Do/Follow for ERE:Mzdlfz HD as previously scheduled Follow up labs to be ordered by PCP:Routine New medications at discharge:noneHome medication changes at discharge with rationale:None Follow up appointments:Future Appointments    Nov 08 2023 11:15 AM - IR Biopsy US  Percutaneous (Arrived)[facility] - Rachael Clark     Nov 08 2023 03:30 [email or web address]     Nov 12 2023 02:15 PM [facility] [place] [facility]TB [facility]     Nov 12 2023 02:30 PM [facility] [place] - Ronal Blake, MD     Nov 12 2023 03:45 PM - ON TREATMENT VISIT [facility] - Ozell Dandy, MD,PhD      Displaying the next 5 appointments. This patient has additional appointments scheduled.   Important radiology findings and follow up:IR biopsy us  percutaneousResult Date: 8/29/2025Ultrasound-guided biopsy of lesion in left paraspinal musculature . Plan: Specimen(s) sent for evaluation. END OF IMPRESSIONMR lumbar spine without and with contrastResult Date: 8/28/2025Evaluation is limited motion artifact. Multiple enhancing metastases in all the lumbar vertebral bodies similar to prior MRI. The L4-L5 level has spondylotic disc bulge, facet hypertrophy, and thickening of the ligament flavum causing mild to moderate stenosis of the spinal canal and moderate right and moderate to severe left neural foraminal stenosis. Otherwise, no compression of the cauda equina. Multiple enhancing metastases in the posterior paraspinal muscles similar to prior MRI. END OF IMPRESSION MR spine thoracic without and with contrastResult Date: 8/28/2025Evaluation limited by motion artifact. Multiple enhancing metastases in the thoracic vertebrae. The lesions in the T6 vertebral body and T7 spinous process appear to have enlarged. No compromise of the spinal canal or thoracic spinal cord. Multiple metastatic lesions in the paraspinal muscles have enlarged. END OF IMPRESSION MR cervical spine without and with contrastResult Date: 8/28/2025Evaluation limited by motion artifact. Enhancing lesions in multiple cervical vertebrae suspect for metastatic disease similar to prior MRI of 10/17/2023. Mild epidural enhancement at the C7 level causing mild narrowing of the spinal canal. No appreciable abnormality of the  cervical spinal cord. Refer to separately reported concurrent MRI of the thoracic spine. END OF IMPRESSION US  doppler vein LEFT lower extremityResult  Date: 8/27/2025No evidence of deep venous thrombosis in the left lower extremity. Hypoechoic nodules/nodes visualized in the left thigh, in the context of known metastatic disease. END OF IMPRESSION EKG 12 leadResult Date: 8/26/2025Sinus tachycardia Low voltage, extremity leads*Chest standard frontal and lateral viewsResult Date: 8/26/2025Bilateral perihilar interstitial opacities consistent with mild pulmonary edema. Moderate left and trace right pleural fluid with bibasilar atelectasis. END OF IMPRESSION I have personally reviewed the images and the Resident's/Fellow's interpretation and agree with or edited the findings. EKG 12 lead (initial)Result Date: 8/8/2025Sinus rhythm Low voltage, extremity and precordial leads Prolonged QT intervalMR lumbar spine without and with contrastResult Date: 8/8/20251. Multiple foci of abnormal signal and contrast enhancement scattered throughout the visualized bone marrow of the spine and sacrum consistent with diffuse bony metastases, similar in appearance to the prior study. 2. Degenerative changes involving the lumbar spine without significant progression. 3. Multiple heterogeneous enhancing lesions scattered throughout the visualized posterior paraspinal musculature, similar to the prior study. 4. Evaluation of the included portions of the abdomen is limited. Heterogeneous polycystic kidneys again noted, partially and suboptimally visualized. Bilateral pleural effusions are partially visualized. Retroperitoneal lymphadenopathy again noted. 5.  No evidence of compression of the conus medullaris or cauda equina. END OF IMPRESSION MR spine thoracic without and with contrastResult Date: 8/8/20251. The patient had significant difficulty remaining still during this examination the  patient was unable to tolerate complete imaging. Precontrast sequencing is limited. Patient was unable to tolerate postcontrast imaging. The acquired images are limited  due to motion artifact. 2. Diminished signal involving the visualized bone marrow similar to prior study, consistent with sequela of renal osteodystrophy. 3. Multiple foci of abnormal T2 signal elevation involving the thoracic spine most notably at the T6, T10, T11, and T12 levels similar to the prior exam. The findings are consistent with bony metastases. 4. Enhancing lesions involving the paraspinal musculature described on the prior examination are suboptimally visualized on the current study due to motion artifact, limited sequencing, and lack of intravenous contrast but are grossly stable. 5. Degenerative changes involving the thoracic spine without significant progression. 6. No significant signal abnormalities identified within the thoracic spinal cord. No evidence of thoracic spinal cord compression 7. Bilateral pleural effusions. END OF IMPRESSION MR cervical spine without and with contrastResult Date: 8/8/2025Degenerative changes involving the cervical spine, as detailed in the Findings section of this report. 1. Diffusely diminished signal involving the visualized bone marrow consistent with sequela of chronic renal osteodystrophy. 2. Multiple enhancing lesion the cervical spine consistent with bony metastases most notably involving C2, C4, and C7 similar in appearance to the prior examination. As noted on the prior exam, there is an expansile component both anteriorly and posteriorly involving the C7 vertebral body contributing to mild narrowing of the thecal sac, similar to the prior study. As noted on the prior exam, there is likely involvement of the left C6-7 and C7-T1 neural foramina similar to the prior exam suboptimally visualized due to motion artifact. 3. Multiple enhancing lesions involving the posterior paraspinal  musculature, grossly stable. 4. Degenerative changes involving the cervical spine without significant progression. 5. No significant signal abnormalities or zones of abnormal contrast enhancement identified in the cervical spinal cord. 6. Bilateral pleural effusions. 7. The patient had difficulty remaining still during this examination. Motion artifact limits assessment. END OF IMPRESSION NM bone scan whole body standardResult Date: 8/1/2025Diffuse background osseous uptake related to patient's renal osteodystrophy limits assessment for metastatic lesions, recommend referring to previous CT and MR imaging for better assessment.  Uptake within the proximal right humerus could represent metastatic lesion or a component of patient's underlying metabolic disease. MR imaging of the right humerus could be obtained if this would change patient management or if clinically warranted. Predominantly cortical uptake within the proximal bilateral femurs could be related to underlying renal disease, superimposed recent injury, or hypertrophic osteoarthropathy in the setting of metastatic cancer. END REPORT I have personally reviewed the images and the Resident's/Fellow's interpretation and agree with or edited the findings. MR head without and with contrastResult Date: 7/31/2025Numerous multifocal areas of punctate enhancement scattered throughout the brain parenchyma. This is consistent with intracranial metastatic disease. No significant mass effect and no evidence of associated hemorrhagic changes. END OF IMPRESSION              Significant Med Changes: None    CONSULTANT SERVICE   Vascular and Interventional Radiology   Signed: Germaine Ila Shallow, MD  On: 11/08/2023  at: 4:14 PM

## 2023-11-08 NOTE — Progress Notes (Signed)
 Writer was notified of patient with leg BP 107/97 and arm BP 90/52 , RR 35, brief run of tachycardia on tele.  Patient seen by Clinical research associate.He denies any c/o. He reports his breathing is better than before. Denies pain. BP 90/52 (BP Location: Right arm)   Pulse 105   Temp 37.9 C (100.3 F) (Oral)   Resp (!) 35   Ht (S) 157.5 cm (5' 2.01) Comment: d/s with Kaitlyn Frsca  Wt 74.7 kg (164 lb 10.9 oz)   SpO2 96%   BMI 30.11 kg/m On exam the patient is in NAD, sitting in bed, using lap top computer, breathing easily on 1L NC with clear lung sounds bilaterally, RR 28, heart regular rhythm, abdomen soft +BS, NT, LEs warm without edema, GCS 15. Chart reviewed including flow sheets, notes, diagnostics, medications. Leg BPs remain stable, arm BP actually improved compared to priors. Patient's work of breathing appears comfortable (RR 28 on 1L NC) and he does not subjectively feel SOB. Tele reviewed, brief run of SVT. Electrolytes/mag assessed on AM labs. 2 g mag ordered x 1. No evidence of a metabolic acidosis on labs. Continue to monitor. Rankin DELENA Glazier, PA

## 2023-11-08 NOTE — Procedures (Signed)
 Procedure Report Time out documentation completed prior to procedure:  YesIndications/Pre-Procedure diagnosis:  Lesions in paraspinal musculatureProcedure performed:  IR biopsy soft tissueGuide Wire(s) removed and inspected for integrity:N/AFindings/Procedure Summary (detailed report located in the Image tab): Ultrasound guided biopsy of lesion in left paraspinal muscle. Complications:  No immediate complicationsCondition:  goodEBL: <10ccSpecimens:  yesOperators: Lavanda Sniff, PA-CDisposition:  Resume care by inpatient unit Post-Procedure Diagnosis: No change from prior [place], [place]  11/08/2023  11:57 AM

## 2023-11-08 NOTE — Invasive Procedure Plan of Care (Signed)
 CONSENT FOR MEDICALOR SURGICAL PROCEDURE                            Patient Name: Shane Pearson 89 MR                                                              DOB: 16-May-1960     Please read this form or have someone read it to you. It's important to understand all parts of this form. If something isn't clear, ask us  to explain. When you sign it, that means you understand the form and give us  permission to do this surgery or procedure. I agree for Archie Platt, MD , and Vascular and Interventional Radiology Attendings, Fellows, Residents and Advanced Practice Providers along with any assistants* they may choose, to treat the following condition(s): Abnormal tissue needing sampling. By doing this surgery or procedure on me: Introduction of a needle under image guidance in to abnormal tissue and obtaining tissue samples This is also known as: Biopsy of paraspinal muscle, soft tissue mass in back  Laterality: Undetermined *if you'd like a list of the assistants, please ask. We can give that to you.1. The care provider has explained my condition to me. They have told me how the procedure can help me. They have told me about other ways of treating my condition. I understand the care provider cannot guarantee the result of the procedure. If I don't have this procedure, my other choices are: Not to perform the procedure.2. The care provider has told me the risks (problems that can happen) of the procedure. I understand there may be unwanted results. The risks that are related to this procedure include: Bleeding, infection, damage to surrounding organs and structures, allergic reaction to contrast agent, and in very rare circumstances, death.3. I understand that during the procedure, my care provider may find a condition that we didn't know about before the treatment started. Therefore, I agree that my care provider can perform any other treatment which they think is  necessary and available.4. I give permission to the hospital and/or its departments to examine and keep tissue, blood, body parts, fluids or materials removed from my body during the procedure(s) to aid in diagnosis and treatment, after which they may be used for scientific research or teaching by appropriate persons. If these materials are used for science or teaching, my identity will be protected. I will no longer own or have any rights to these materials regardless of how they may be used.5. My care provider might want a representative from a medical device company to be there during my procedure. I understand that person works for:        The ways they might help my care provider during my procedure include:      6. Here are my decisions about receiving blood, blood products, or tissues. I understand my decisions cover the time before, during and after my procedure, my treatment, and my time in the hospital. After my procedure, if my condition changes a lot, my care provider will talk with me again about receiving blood or blood products. At that time, my care provider might need me to review and sign another consent form, about getting or refusing blood.I understand that  the blood is from the community blood supply. Volunteers donated the blood, the volunteers were screened for health problems. The blood was examined with very sensitive and accurate tests to look for hepatitis, HIV/AIDS, and other diseases. Before I receive blood, it is tested again to make sure it is the correct type.My chances of getting a sickness from blood products are small. But no transfusion is 100% safe. I understand that my care provider feels the good I will receive from the blood is greater than the chances of something going wrong. My care provider has answered my questions about blood products.My decisionabout blood orblood products   My decision about tissueImplants  I  understand thisform.My care provideror his/herassistants haveexplained: What I am having done and why I need it.What other choices I can make instead of having this done.The benefits and possible risks (problems) to me of having this done.The benefits and possible risks (problems) to me of receiving transplants, blood, or blood products.There is no guarantee of the results.The care provider may not stay with me the entire time that I am in the operating or procedure room.My provider has explained how this may affect my procedure. My provider has answered myquestions about this. I give mypermission forthis surgery orprocedure.        _________________________________________                                   My signature(or parent or other person authorized to sign for you, if you are unable to sign for yourself or if you are under 79 years old)       _____________   Date    (MM/DD/YYYY)     _______  Time  Electronic Signatures will display at the bottom of the consent form.Care provider's statement: I have discussed the planned procedure, including the possibility for transfusion of bloodproducts or receipt of tissue as necessary; expected benefits; the possible complications and risks; and possible alternativesand their benefits and risks with the patients or the patient's surrogate. In my opinion, the patient or the patient's surrogateunderstands the proposed procedure, its risks, benefits and alternatives.      Electronically signed by: Lavanda Sniff, PA                                          11/08/2023       Date    11:07 AM      Time

## 2023-11-08 NOTE — Progress Notes (Signed)
 Nephrology Progress Note LOS: 2 days Interval HistoryNo acute events overnightHD yesterday with 2L UFSubjective Patient seen & examined at bedside for extra HD needs. Chart reviewed for events, lab, and radiology data. Denies SOB, CP, N/V/D. He feels fine this morning, notes that yesterday's treatment went very well. No cramping, lightheadedness during treatmentObjective  Current Vitals Vitals Range (24 Hours) BP 123/86 (BP Location: Right leg)   Pulse 99   Temp 37.4 C (99.4 F) (Oral)   Resp 24   Ht (S) 1.575 m (5' 2.01) Comment: d/s with Kaitlyn Frsca  Wt 74.7 kg (164 lb 10.9 oz)   SpO2 94%   BMI 30.11 kg/m  BP: (60-132)/(38-97) Temp:  [36 C (96.8 F)-37.9 C (100.3 F)] Temp src: Oral (08/29 0744)Heart Rate:  [94-167] Resp:  [16-35] SpO2:  [89 %-100 %] Weight:  [74.7 kg (164 lb 10.9 oz)]  I/Os Weight I/O last 3 completed shifts:08/28 0700 - 08/29 0659In: 1565.5 (21 mL/kg) [P.O.:608; I.V.:400 (0.2 mL/kg/hr); IV Piggyback:557.5]Out: 2400 (32.1 mL/kg) [Other:2400]Net: -834.6Weight: 74.7 kg  Last 4 Weights  11/06/23 1829 11/07/23 0738 11/07/23 0815 11/07/23 1230 Weight: (S) 76.4 kg (168 lb 6.9 oz) 76.7 kg (169 lb 1.5 oz) 76.7 kg (169 lb 1.5 oz) 74.7 kg (164 lb 10.9 oz)  Physical Exam:Constitutional:General: Not in acute distress, sitting up in chair Eyes: Anicteric scleraeHENT:Head: Normocephalic and atraumaticNose: Nose normalCVS: Rate and Rhythm: Normal rate and rhythmOther: No peripheral edema bilaterallyPulmonary: Effort: No respiratory distress,Breath sounds: CTAB anteriorly on oxygen via NC MSK: Warm and well perfusedNeuro: General: No focal deficit notedMental status: Alert and oriented x3Psych: Mood and Affect: Mood normal.    Behavior: Behavior normal. Access: HDTC, dressing CDIRecent Labs Lab 08/29/250000 08/28/250309 08/27/250917 08/26/251829 Sodium 140 138 139 140  Potassium 4.1 5.1 5.5* 5.3* Chloride 98 94* 92* 90* CO2 23 23 27 28  Anion Gap 19* 21* 20* 22* UN 23* 45* 63* 52* Creatinine 4.22* 6.21* 7.56* 6.63* Glucose 129* 98 78 74 Calcium  8.2* 7.8* 8.5* 8.2* Albumin  --   --   --  3.4* Phosphorus 3.3 4.1  --  4.0 Magnesium  1.7 1.6  --  1.7 No results found for: BKVQLNo results found for: CMVQLAerobic bacterial pleural or thoracentesis fluid culture and stain Date Value Ref Range Status 10/01/2023 Lab Cancel  Final Bacterial Blood Culture Date Value Ref Range Status 11/05/2023 .  Preliminary 10/17/2023 .  Final 10/17/2023 .  Final  Recent Labs Lab 08/29/250000 08/28/250309 08/27/250917 WBC 12.2* 15.8* 17.7* Hemoglobin 10.0* 10.3* 10.4* Hematocrit 35* 35* 35* Platelets 229 265 270   Component Value Date/Time  FE 65 10/17/2023 1644  IBC 176 (L) 10/17/2023 1644  FESAT 37 10/17/2023 1644  FER 881 (H) 10/17/2023 1644 No results for input(s): TACRR, SIR, CSAM in the last 168 hours. No results for input(s): JOLINDA PLEVA, [place], [place], [place], [place], UOSMO in the last 168 hours.No results for input(s): UAPP, UCOL, UAGLU, KETONESU, USG, UBLD, UAPH, UPRO, UNITR, ULEU, URBC, UWBC, UMUC, [place], [place], SPECIFICGRAV, BLOODUA, PHUA, PROTEIN, NITRITEUA, LEUKESTERASE, GLUCOSESIE, KETONESIEM, PUSG, BLOODSIEMEN, PUAPH, PROTEINUA, PUNIT, LEUKOCYTESIE in the last 8760 hours. MR lumbar spine without and with contrastResult Date: 8/28/2025Evaluation is limited motion artifact. Multiple enhancing metastases in all the lumbar vertebral bodies similar to prior MRI. The L4-L5 level has spondylotic disc bulge, facet hypertrophy, and thickening of the ligament flavum causing mild to moderate stenosis of the spinal canal and moderate right and moderate to severe left neural foraminal stenosis. Otherwise, no compression of  the cauda equina. Multiple enhancing metastases in  the posterior paraspinal muscles similar to prior MRI. END OF IMPRESSION MR spine thoracic without and with contrastResult Date: 8/28/2025Evaluation limited by motion artifact. Multiple enhancing metastases in the thoracic vertebrae. The lesions in the T6 vertebral body and T7 spinous process appear to have enlarged. No compromise of the spinal canal or thoracic spinal cord. Multiple metastatic lesions in the paraspinal muscles have enlarged. END OF IMPRESSION MR cervical spine without and with contrastResult Date: 8/28/2025Evaluation limited by motion artifact. Enhancing lesions in multiple cervical vertebrae suspect for metastatic disease similar to prior MRI of 10/17/2023. Mild epidural enhancement at the C7 level causing mild narrowing of the spinal canal. No appreciable abnormality of the cervical spinal cord. Refer to separately reported concurrent MRI of the thoracic spine. END OF IMPRESSION US  doppler vein LEFT lower extremityResult Date: 8/27/2025No evidence of deep venous thrombosis in the left lower extremity. Hypoechoic nodules/nodes visualized in the left thigh, in the context of known metastatic disease. END OF IMPRESSION EKG 12 leadResult Date: 8/26/2025Sinus tachycardia Low voltage, extremity leads*Chest standard frontal and lateral viewsResult Date: 8/26/2025Bilateral perihilar interstitial opacities consistent with mild pulmonary edema. Moderate left and trace right pleural fluid with bibasilar atelectasis. END OF IMPRESSION I have personally reviewed the images and the Resident's/Fellow's interpretation and agree with or edited the findings.  Current MedicationsScheduled Meds: aspirin   81 mg Oral Daily  atorvastatin   20 mg Oral Daily with dinner  B complex-vitamin C-folic acid   1 tablet Oral Daily  calcium -vitamin D   1 tablet Oral BID  cinacalcet   60 mg Oral QPM  fluticasone   1 spray Nasal  Daily  pantoprazole   40 mg Oral QAM  senna  1 tablet Oral Nightly  enoxaparin   30 mg Subcutaneous Daily @ 2100  midodrine   10 mg Oral TID  fludrocortisone   0.1 mg Oral TID Continuous Infusions:PRN Meds:acetaminophen , melatonin, ondansetron , polyethylene glycol, heparin  (porcine), heparin  (porcine), sodium chloride , dextrose   AssessmentGreg Loeza is a 63 y.o. 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 Who presented to the ER for worsening bilateral lower extremity weakness and pain. Concern for disease progression. Nephrology is managing dialysis and UF.PlanESRD 2/2 PKD on YI:Indz adjust medications to CrCl <10 mL/sContinue nephroviteLast HD 8/28Dialysis access: HDTCElectrolytes:Sodium and potassium WNL - cont low potassium diet CKD-BMD:Calcium  Level: 8.2, acceptablePO4 Level: 3.3, WNL- sensipar  60 mg daily- calcium -vitamin D  500 mg-200 unit 1 tab daily Acid-Base Disorders: Will keep within desired goal range (serum bicarb 22-25) with RRT against 35 bicarbHemodynamics: SBP range: 60-100'sMidodrine 10 mg TID Will remove fluid with scheduled HD treatmentsAnemia: Hgb as aboveHemoglobin goal (chronically) 10-11.5 gm/dlTransfusion of PRBC as needed per primary teamNo urgent indications for dialysis today. Patient with no overt signs of fluid overload, labs fairly stable from nephrology standpoint. Next dialysis per schedule. Remainder of management per primary team.Thank you for allowing us  to participate in the care of this patient.Ronal Critchley, NPNephrology DivisionUniversity of Sheridan Medical CenterPager 32218/29/2025 10:15 AM

## 2023-11-08 NOTE — Progress Notes (Addendum)
 The student was personally supervised by me during the patient examination. I personally saw and evaluated the patient, provided the medical decision-making, and reviewed and verified the key elements of the student documentation. Outlined below are additions and/or clarifications to the student's note: see my additions in blueHematology Oncology Hospitalist Progress Note  LOS: 2 days  Full Code Significant Events/Subjective: Overnight APP called to room for tachypnea to 35. He was comfortably resting in bed at this time, he reported that his breathing was better.  He was on 1 L with clear lung sounds bilaterally.  BPs were actually improved from prior, and telemetry only revealed a brief run of SVT. This morning, [name] states that he is doing well.  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  He states that his breathing has been much better, and continues to improve.  He states that his pain is well-controlled, and has not required pain medication.  He states that he just felt oversedated from the Dilaudid  that he received during the MRI.  He was on 1 L of oxygen this morning, and later when seen by Dr. Olivia was off oxygen.  He states that he has been able to walk around with PT, and move with his walker.  He would like to go home today. Patient doing well today. 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 He feels back to baseline, his is off oxygen. His pressures have always been an issue for him . Given the prior hx of AV fistula surgeries arm blood pressures may not be fully reliable. He is feeling back to baseline, has been ambulating with a walker which is his baseline. He is not on home O2. Review of Systems Constitutional:  Positive for malaise/fatigue and weight loss. Negative for fever. Eyes:  Positive for blurred vision and double vision. Respiratory:  Positive for shortness of breath. Negative for cough and hemoptysis.   Cardiovascular:  Positive for orthopnea and leg swelling. Negative for chest pain and palpitations. Neurological:  Positive for tingling, sensory change and weakness. Negative for dizziness, speech change and headaches. Objective: Physical ExamTemp:  [36 C (96.8 F)-37.9 C (100.3 F)] 37.7 C (99.9 F)Heart Rate:  [86-167] 98Resp:  [16-35] 26BP: (60-132)/(38-97) 86/5274.7 kg (164 lb 10.9 oz)Physical ExamConstitutional:     General: He is not in acute distress.   Appearance: He is ill-appearing. HENT:    Head: Normocephalic and atraumatic. Cardiovascular:    Rate and Rhythm: Normal rate and regular rhythm. Pulmonary:    Effort: Pulmonary effort is normal. No respiratory distress.    Comments: Off oxygenAbdominal:    General: Bowel sounds are normal.    Tenderness: There is no abdominal tenderness. Skin:   General: Skin is warm and dry. Neurological:    Mental Status: He is alert and oriented to person, place, and time.    Cranial Nerves: No cranial nerve deficit.    Motor: Weakness (Weakness much improved since admission) present. Recent Lab Studies:Personally reviewed and notable qnm:[facility] Lab 08/29/250000 08/28/250309 08/27/250917 WBC 12.2* 15.8* 17.7* Hemoglobin 10.0* 10.3* 10.4* Hematocrit 35* 35* 35* Platelets 229 265 270  Recent Labs Lab 08/29/250000 08/28/250309 08/27/250917 08/26/251829 Sodium 140 138 139 140 Potassium 4.1 5.1 5.5* 5.3* Chloride 98 94* 92* 90* CO2 23 23 27 28  UN 23* 45* 63* 52* Creatinine 4.22* 6.21* 7.56* 6.63* Glucose 129* 98 78 74 Calcium  8.2* 7.8* 8.5* 8.2* Albumin  --   --   --  3.4* Phosphorus  3.3 4.1  --  4.0 No results for input(s): APTT, INR, PTT, PTI in the last 168 hours.Recent Imaging Studies:Personally reviewed and notable for:MRI C-spine: Evaluation limited by motion artifact.  Enhancing lesions in multiple cervical  vertebrae suspect for metastatic disease similar to prior MRI of 10/17/2023.  Mild epidural enhancement at the C7 level causing mild narrowing of the spinal canal.  No appreciable abnormality of the cervical spinal cord.  Refer to separately reported concurrent MRI of the thoracic spine. MRI T-spine:Evaluation limited by motion artifact. Multiple enhancing metastases in the thoracic vertebrae. The lesions in the T6 vertebral body and T7 spinous process appear to have enlarged. No compromise of the spinal canal or thoracic spinal cord.  Multiple metastatic lesions in the paraspinal muscles have enlarged. MRI L-spine: Evaluation is limited motion artifact.  Multiple enhancing metastases in all the lumbar vertebral bodies similar to prior MRI.  The L4-L5 level has spondylotic disc bulge, facet hypertrophy, and thickening of the ligament flavum causing mild to moderate stenosis of the spinal canal and moderate right and moderate to severe left neural foraminal stenosis.  Otherwise, no compression of the cauda equina.  Multiple enhancing metastases in the posterior paraspinal muscles similar to prior MRI. Current Inpatient Medications: aspirin   81 mg Oral Daily  atorvastatin   20 mg Oral Daily with dinner  B complex-vitamin C-folic acid   1 tablet Oral Daily  calcium -vitamin D   1 tablet Oral BID  cinacalcet   60 mg Oral QPM  fluticasone   1 spray Nasal Daily  pantoprazole   40 mg Oral QAM  senna  1 tablet Oral Nightly  enoxaparin   30 mg Subcutaneous Daily @ 2100  midodrine   10 mg Oral TID  fludrocortisone   0.1 mg Oral TID acetaminophen , melatonin, ondansetron , polyethylene glycol, heparin  (porcine), heparin  (porcine), sodium chloride , dextroseAssessment: Shane Pearson is a 63 year old male with past medical history of 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  In terms of pain control, we have reached an adequate level of pain relief, he states he does not need any additional pain medications.  The weakness has also improved.  And the MRI is also reassuring in terms of cord compression.  Neurosurgery consulted to see if any intervention could be done in the setting of the metastases that are progressing on MRI, will follow-up their final recommendations.  IR to biopsy the paraspinal metastases today for further sampling/testing.  If possible given scheduling, he will also get radiation to the cervical spine today.  His shortness of breath is improved to the point that he has now off oxygen again.  His BPs being low yesterday were likely due to his bilateral upper extremity fistulas that are artificially lowering his blood pressure, his leg BPs have been stable.  Symptomatically as well he has improved, we will see if we can get the echo to be done earlier prior to his potential discharge today. Plans: ONC: Stage IV carcinoma, likely renal cell- Metastasis to brain and bone- Followed outpatient by Dr. Bronson, - Last treated with 2 of 5 fractions of radiation to the cervical spine, can call rad onc in the AM- Dr. Bronson would also recommend urgent inpatient biopsy of one of the paraspinal muscular lesion- biopsy done today with IR - has appt with Dr. Bronson on 9/4 Worsening lower extremity weakness-MRI spine  imaging from yesterday showing the lesions in T6 vertebral body, and T7 spinous processes have enlarged.  There are multiple metastatic lesions in the paraspinal muscles have enlarged.  No appreciable abnormality of the cervical spinal cord, there is mild narrowing of the spinal canal at the C7 level.  C2 C4 and C7 bony mets are similar to prior MRI on 8/7.  Diffuse bony mets of the L-spine,  sacrum.  No compression of cauda equina. - Pain management adequate, we will not discharged with any additional pain medications- Did not receive radiation to cervical spine yesterday; we will pursue today after IR biopsy-B12 normal on 8/7, iron panel without deficiency- TSH 7.09 with mildly low free T4 at 0.6 - Will pursue IR guided biopsy of the paraspinal muscular lesions today- Consulted NSGY for any intervention that could be done in the setting of MRI findings; preliminarily they anticipate that there were no interventions that could be done - no acute neurosurgical intervention needed. He is asymptomatic from a pain standpoint and his weakness seems to have improved. - OT recommending 3-in-1 commode which will be prescribed on discharge.  Acute hypoxic respiratory failure with hypotension most likely in setting of acute diastolic HF; c/f sepsis low for now - Pressures have been low while reading on the arms, but normal from legs; he has had fistula creation in both arms, which may be contributing to the poor pressure readings -we will base management after reading from the legs- He is now off oxygen and satting well- Bcx NGTD - COVID/Flu/RSV negative -WBC 12.2(15.8) -l elevation likely in the setting of recent steroids and not reflection of infection- Cefepime  discontinued yesterday in the setting of our low clinical concern of infection-  Will resume HD as scheduled on TTS schedule on discharge - Cont home Midodrine  10mg  TID & Florinef  TID -give before dialysis session- We will consider updating echocardiogram(last echo in 2019 with preserved EF of 65%), grade 2 diastolic dysfunction - repeat ECHO ordered; will see if we can get it done today iso of potential discharge today - would benefit from outpatient ECHO if can't be done in house given patient would like to be discharged today. - Aspirin  81mg  - Lipitor 20mg   Chronic Medical Problems:End-stage renal  disease on HD TTS-Nephro-Vite-CinacalcetGERD- Pantoprazole  40mg  daily FEN: Regular diet DVT PPx: Prophylactic Lovenox  Code Status: Full CodeDischarge Planning: (EDD):  tbdDischarge Criteria/Barriers to Discharge: pending workupPT and/or OT Recommendations/Discharge to: pending Appointments Needed with: Oncology Case discussed with Dr. Olivia, nephrology, radaition Dannial Sink on 11/08/2023 at 6:19 AMCase discussed with MS4, care coordination, Ot, PTI personally spent >50 minutes on the calendar day of the encounter, including pre and post visit work,

## 2023-11-08 NOTE — CDI Query (Signed)
 Request for Documentation ClarificationStrong Pearson, GregoryMRN [id number]; VISIT [id number]; DELAYNE [id number] Query Response==============Sent: 11/08/23 06:20 EDTFrom: OLIVIA GERMAINE DOWDY, MDQUERY QUESTION: BASED ON YOUR MEDICAL JUDGMENT, CAN YOU FURTHER CLARIFY THEPRECISE NATURE AND SEVERITY OF THE PATIENT'S HEART FAILURE FOR THIS ADMISSION?Provider response: Acute Diastolic (Preserved EF) Heart FailureElectronically signed by OLIVIA GERMAINE DOWDY, MD on 11/08/23 at 06:20 EDTOriginal Query==============Sent: 11/08/23 06:18 EDTFrom: Clark, TylerTo: Davita Sublett MANOJ, MDBASED ON YOUR MEDICAL JUDGMENT, CAN YOU FURTHER CLARIFY THE PRECISE NATURE ANDSEVERITY OF THE PATIENT'S HEART FAILURE FOR THIS ADMISSION?* Acute Diastolic (Preserved EF) Heart Failure* Chronic Diastolic (Preserved EF) Heart Failure* Acute on Chronic Diastolic (Preserved EF) Heart Failure* Other:CLINICAL INFORMATION* The following is also documented in the MEDICAL RECORD NUMBERCHF EF 60% - EDProvider* Shortness of breath: SOB- most likely etiology of this is the pulmonaryedema that was visualized on CXR, likely in the setting of CHFREFERENCED DOCUMENTS* Progress Notes - 11/06/23 09:41

## 2023-11-08 NOTE — Progress Notes (Incomplete)
 Patient discharged home this afternoon. No new medications. PIV's removed by bedside RN. AVS reviewed with patient by this Clinical research associate.UR home care will resume nursing services at discharge. Patient refused 3:1 commode recommended by OT/PT. Pt discharged, via wheelchair, wearing appropriate shoes and clothing for his ride home.

## 2023-11-08 NOTE — Progress Notes (Addendum)
 Social Work Progress NoteContacts:  Virgina Dales, RN, [facility] Care CoordinatorStephanie Bronk, RN, [facility] Care CoordinatorF[facility], [phone], [phone] (fax)Intervention: This writer alerted that patient will discharge today. Patient receives HD at [facility] in the community. They need d/c summary, AVS, med list, and any new HD orders from this admission faxed to them at discharge.Where: [facility] [place] [phone] When: TTS Time 0725 Transport: wife Update (4:20pm): This Clinical research associate faxed d/c summary, med list, AVS, and recent HD notes to [facility].Plan:  This Clinical research associate will continue to follow the patient and provide assistance as necessary.Chiquita Shake, LMSWWCC6 Social Worker

## 2023-11-08 NOTE — Progress Notes (Signed)
 Physical Therapy Discharge NoteTherapy Recommendations:Discharge Recommendations:  Discharge to Planned Living Arrangement with Caregiver Assistance:     Patient's mobility is currently not a barrier to discharge. No further PT visits needed. . Recommendations: PT Discharge Equipment Recommended: (P) None Additional justification: n/aPT Mobility Recommendations: (P) 1A for ambulation short distancePT Referral Recommendations: (P) OT, SWPhysical therapy no longer following this patient.   11/08/23 1345 PT Tracking PT TRACKING PT Discontinue Visit Number Visit Number [facility]) / Treatment Day (HH) 0 Visit Details [facility]) Visit Type [facility]) Follow Up-Home D/C Precautions/Observations Precautions used Yes Fall Precautions General falls precautions Activity Orders Present Yes LDA Observation Monitors Vital Signs Response with Therapy SpO2 ~91 throughout when checked. Some tachypnea with activity but stable Was patient wearing a mask? No PPE worn by Clinical research associate Mask;Gloves Patient Subjective Wanting to go home Other Recieved after going to IR. Agreeable to ambulate. Reported some sleepiness from the procedure Current Pain Assessment Pain Assessment / Reassessment Assessment Pain Scale 0-10 (Numeric Scale for Pain Intensity) 0-10  Pain Scale 0 No Intervention/MAR Intervention(s) No Intervention required Communication Communication Style Verbal Cognition Arousal/Alertness Appropriate responses to stimuli Orientation A&Ox4 Ability to Follow Instructions Follows all commands and directions without difficulty Bed Mobility Additional comments Pts wife assisted patient to the EOB upon arrival. No assist by Clinical research associate. Transfers Transfers Tested Stand Pivot Transfers Stand-by assist Sit to Stand Stand by assistance Stand to sit Stand by assistance Transfer Assistive Device rolling walker Additional comments Steady with sit to stand from chair  and bed. No LOB. Increased effort but stable. Mobility Mobility: Gait/Stairs Tested Gait Pattern Decreased R step length;Decreased cadence;Decreased L step length Ambulation Assist Stand by;1 person assist Ambulation Distance (Feet) 30 ft x1, 60 ft x1 Ambulation Assistive Device rolling walker Stairs Assistance Stand by;1 person assist Stair Management Technique Two rails;Step to pattern;Forwards Number of Stairs 4 Additional comments Pt ambulated with RW and SBA for safety due to some fatigue and shortness of breath. No LOB with ambulation. Good stability with turning. Asnceded and descended with SBA for safety. Educated on guarding technique. Wife feels comfortable performing this level of assist with him. Pt feels safe at this level Training and Education Patient Role of acute PT and current recommendations for home d/c with support and continue home PT. Balance Sitting - Static Independent Sitting - Dynamic Independent Standing - Static Supported;Standby assist Standing - Dynamic Supported;Standby assist Additional comments RW in standing PT AM-PAC Mobility Turning over in bed? 4 Moving from lying on back to sitting on the side of the bed? 4 Moving to and from a bed to a chair? 3 Sitting down on and standing up from a chair with arms? 3 Need to walk in hospital room? 3 Climbing 3 - 5 steps with a railing? 3 Total Raw Score 20 AM-PAC T-Scale Score 43.99 Assessment Brief Assessment Skilled acute PT is not indicated;Family able to provide necessary assistance with mobility and exercise program Problem List Impaired functional mobility;Impaired bed mobility;Impaired stair navigation;Impaired transfers;Impaired ambulation;Impaired balance;Impaired endurance Patient / Family Goal get home today Overall Assessment Pt functioning near his baseline and is safe to return home when medically ready with family support. Discussed with wife about giving SBA for  all out of bed mobility when at home initially and can progress off with guidance of PT as patient improves. Plan/Recommendation PT Treatment Interventions No further PT interventions PT Frequency none further PT Positioning Recommendations OOBTC daily; meals upright PT Mobility Recommendations 1A for ambulation short distance PT Referral Recommendations OT;SW PT  Discharge Recommendations Anticipate return to prior living arrangement;Home PT;Intermittent supervision/assist PT Discharge Equipment Recommended None Transportation Recommendations Family car PT Assessment/Recommendations Reviewed With: Patient;Nursing;Care coordinator Next PT Visit None further PT needs to see patient prior to DC  No Time Calculation Total Time Therapeutic Activities (minutes) 15 Total Time Gait Training (minutes) 15 Total Time Therapeutic Exercises (minutes) 0 Total Time Neuromuscular Re-education (minutes) 0 Total Time Caregiver Training (minutes) 0 Total Time Group Therapy (minutes) 0 PT Timed Codes 30 PT Untimed Codes 0 PT Unbilled Time 0 PT Total Treatment 30 Plan and Onset date Plan of Care Date 11/08/23 Onset Date 11/05/23 Treatment Start Date 11/06/23 Fairy Flemings PT, DPTPlease contact physical therapist on the treatment team via secure chat or via the secure chat group for your unit Physical Therapist with any questions.On weekends, please utilize the secure chat group, [facility] Physical Therapy 1st call, to contact PT.

## 2023-11-08 NOTE — Interval H&P Note (Signed)
 UPDATES TO PATIENT'S CONDITION on the DAY OF SURGERY/PROCEDUREI. Updates to Patient's Condition (to be completed by a provider privileged to complete a H&P, following reassessment of the patient by the provider):Day of Surgery/Procedure Update:HistoryHistory reviewed and no changePhysicalPhysical exam updated and no change  II. Procedure Readiness I have reviewed the patient's H&P and updated condition. By completing and signing this form, I attest that this patient is ready for surgery/procedure.III. Attestation I have reviewed the updated information regarding the patient's condition and it is appropriate to proceed with the planned surgery/procedure.NEILA KEELER, MD as of 11:36 AM 11/08/2023

## 2023-11-08 NOTE — Progress Notes (Signed)
 Interventional Radiology Pre-Procedure Handoff/ChecklistNPO:  NoTube feed Stopped: NoAnticoagulants:ASA and lovenoxTelemetry: Child psychotherapist mode: Cabin crew of Transporters needed: 1  Hover Mat:  YesIs patient changed into a hospital gown? YesReminded floor RN to have patient remove jewelry and leave valuables on the unit: YesIV access: Hemodialysis Catheter:  Tunneled Double lumen Left Internal Jugular (Active) Insertion Site Description Clean;Dry 11/07/23 1409 Phlebitis Scale Grade 0 11/07/23 2045 Infiltration Scale Grade 0 11/07/23 2045 Proximal Lumen Status Capped 11/07/23 2045 Distal Lumen Status Capped 11/07/23 2045 Dressing Type Transparent CHG 11/07/23 2045 Dressing Status Clean, dry, and intact 11/07/23 2045 Dressing Change New 11/06/23 1600 (T)Transparent Drsg Change- (Every 7 Days) Dressing changed 11/06/23 1600 Dressing Change Due 11/13/23 11/07/23 2045 Needleless Access Device(s) Changed? No 11/07/23 2045 Needleless Access Device(s) Change Due 11/09/23 11/07/23 2045 Heparin /TPA Dwell Yes - Heparin  11/07/23 2045 Freq of line accesses and lab draws addressed? Yes 11/07/23 2045 Extravasation No 11/07/23 1230 Comments High Arterial pressures, lines reversed during tx 11/06/23 1600   Peripheral IV 11/05/23 1830 Ultrasound guided Anterior;Distal;Right Upper arm (Active) Phlebitis Scale Grade 0 11/08/23 0000 Infiltration Scale Grade 0 11/08/23 0000 Line Status Flushed;Blood return noted;Normal Saline locked;Capped 11/08/23 0000 Dressing Type Transparent 11/08/23 0000 Dressing Status Clean, dry, and intact 11/08/23 0000 Dressing Change Due 11/12/23 11/07/23 2045 Extravasation No 11/07/23 2045 Respiratory: Room AirConsentable:yesAlert and Oriented to person, place and time?: yesCode Status: Full CodeDoes patient wear an insulin  pump? N/APrecautions:noneAllergies: Allergies[1]Nyasia Baxley, Public affairs consultant Report  from [place], Charity fundraiser for [employer] procedure 9:10 AM [1] AllergiesAllergen Reactions  Penicillins Swelling and Rash   Facial swellingMed History Tech discussed Penicillin allergy with patient on 10/18/23. Please see Antimicrobial Stewardship Chart Note for additional details.  Amoxicillin Swelling and Rash   Facial swellingMed History Tech discussed Amoxicillin allergy with patient on 10/18/23. Please see Antimicrobial Stewardship Chart Note for additional details.  Baclofen Other (See Comments)   confusion  Coconut Nausea And Vomiting  No Known Latex Allergy

## 2023-11-08 NOTE — Progress Notes (Signed)
 [facility] Nursing Procedure Leif Loflin  [id number] Procedure (as described by patient): Biopsy    Status:Completed. Patient tolerated procedure wellSpecimen collection:yesSponge count:N/AFluid removed:N/AProcedure dressing site location:BackDressing type:gauze and tegadermDressing status:Clean, dry and intact Hematoma:Not evidentTube labeled:N/AMedication received:Lidocaine  8 mlCardiovascular: Peripheral Pulses N/A Implant patient information given to patient or parent/guardian:N/ARadiation overexposure: N/AReport given to:Unit Nurse. Unit [facility] Name BrookeLast Filed Vitals  11/08/23 1219 BP: 96/55 Pulse: 93 Resp: 24 Temp: 36.3 C (97.3 F) SpO2: 94%

## 2023-11-08 NOTE — H&P (Signed)
 Department of NeurosurgeryConsultation Note       Reason for Consult: BLE weakness    On-Call Attending: Dr. 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 Reports mild improvement in symptoms, well-controlled pain, and increased fatigue after hydromorphone . On 1-2L O? with improved dyspnea after ultrafiltration. MRI spine shows progression of vertebral and paraspinal metastases with mild C7 canal narrowing but no cord or cauda equina compression. Planning for IR-guided biopsy and continuation of cervical spine radiation. He had been having BLE weakness according to primary team which has been slowly improving. However most recent MRI called an L4/5 disc bulge for which neurosurgery was consulted. Past Medical History: Past Medical History[1]Past Surgical History: Past Surgical History[2]Family History: Family History[3]Social History: Patient  reports that he quit smoking about 31 years ago. His smoking use included cigarettes. He has a 4.5 pack-year smoking history. He has never used smokeless tobacco. He reports that he does not drink alcohol and does not use drugs.Allergies: Allergies[4]Medications:Medications Prior to Admission Medication Sig  acetaminophen  (TYLENOL ) 500 mg tablet Take 1 tablet (500 mg total) by mouth 3 times daily as needed for Pain for Pain. Take 2 tabs (1000mg ) by mouth three times a day as needed for pain. MDD 3000mg .  midodrine  (PROAMATINE ) 10 mg tablet Take 1 tablet (10 mg total) by mouth 3 times daily for Disorder of Low Blood Pressure.  pantoprazole  (PROTONIX ) 40 mg EC tablet Take 1 tablet (40 mg total) by mouth every morning. Swallow whole. Do not crush, break, or chew.  magnesium  oxide (MAGNESIUM   OXIDE -MG SUPPLEMENT) 400 (241.3 mg) mg tablet Take 1 tablet (400 mg total) by mouth daily.  aspirin  81 MG EC tablet Take 1 tablet (81 mg total) by mouth daily for DVT prophylaxis.  fluticasone  (FLONASE ) 50 MCG/ACT nasal spray Spray 1 spray into nostril daily for allergies.  Spray 1 spray each nares  atorvastatin  (LIPITOR) 20 mg tablet Take 1 tablet (20 mg total) by mouth daily (with dinner) for High Amount of Fats in the Blood.  fludrocortisone  (FLORINEF ) 0.1 mg tablet Take 1 tablet (100 mcg total) by mouth 3 times daily for Blood Pressure Drop Upon Standing.  b complex-vitamin c-folic acid  (NEPHRO-VITE) tablet Take 1 tablet by mouth daily for Complex vitamin.  calcium -vitamin D  (OSCAL-500) 500-200 MG-UNIT per tablet Take 1 tablet by mouth 2 times daily for Vit D.  cinacalcet  (SENSIPAR ) 30 MG tablet Take 2 tablets (60 mg total) by mouth every evening for parathyroid.  walker Exact equipment needed: Rolling walker Specific length of time needed: LifetimeICD10 Code: R26.2 Ht: 5' 5   Wt: 175 lbs Current Facility-Administered Medications Medication Dose Route Frequency  aspirin  EC tablet 81 mg  81 mg Oral Daily  atorvastatin  (LIPITOR) tablet 20 mg  20 mg Oral Daily with dinner  B complex-vitamin C-folic acid  (NEPHRO-VITE) tablet 1 tablet  1 tablet Oral Daily  calcium -vitamin D  (OSCAL-500) 500mg -200unit per tablet 1 tablet  1 tablet Oral BID  cinacalcet  (SENSIPAR ) tablet 60 mg  60 mg Oral QPM  fluticasone  (FLONASE ) 50 MCG/ACT nasal spray 1 spray  1 spray Nasal Daily  pantoprazole  (PROTONIX ) EC tablet 40 mg  40 mg Oral QAM  acetaminophen  (TYLENOL ) tablet 650 mg  650 mg Oral Q4H PRN  melatonin tablet 3 mg  3 mg Oral QHS PRN  ondansetron  (ZOFRAN ) injection 4  mg  4 mg Intravenous Q6H PRN  senna (SENOKOT) tablet 1 tablet  1 tablet Oral Nightly  polyethylene glycol (GLYCOLAX ,MIRALAX ) powder 17 g  17 g Oral Daily PRN  enoxaparin  (LOVENOX ) injection 30 mg  30 mg  Subcutaneous Daily @ 2100  heparin  (porcine) 1,000 units/mL injection 0-5,000 units  0-5 mL Intracatheter PRN  heparin  (porcine) 1,000 units/mL injection 0-5,000 units  0-5 mL Intracatheter PRN  sodium chloride  0.9 % FLUSH REQUIRED IF PATIENT HAS IV  0-500 mL/hr Intravenous PRN  dextrose  5 % FLUSH REQUIRED IF PATIENT HAS IV  0-500 mL/hr Intravenous PRN  midodrine  (PROAMATINE ) tablet 10 mg  10 mg Oral TID  fludrocortisone  (FLORINEF ) tablet 0.1 mg  0.1 mg Oral TID Review of Systems: As above, otherwise 12 point review of systems negative. Physical Examination:BP: (60-132)/(38-97) Temp:  [36 C (96.8 F)-37.9 C (100.3 F)] Temp src: Oral (08/29 0300)Heart Rate:  [86-167] Resp:  [16-35] SpO2:  [89 %-100 %] Weight:  [74.7 kg (164 lb 10.9 oz)-76.7 kg (169 lb 1.5 oz)] General Physical Examination:Head: normocephalic, atraumaticLungs: Breathing nonlabored on RANeurological exam:Constitutional: GCS 15 (E4V5M6), A&Ox3 (person, place, month/year), no acute distressCNs: PERRL,  conjugate gaze without preference, EOMI,  tongue midlineMotor:     Right Left C5 Deltoid 5 5 C6 Bicep/WE 5 5 C7 Tricep/WF 5 5 C8 Grip 5 5 T1 Finger abductor 5 5 L1-2 Hip flexors 4+ 4+ L3 Knee extension 5 5 L4 Ankle dorsiflexion 5 5 L5 EHL 5 5 S1 Ankle plantar flexion 5 5 Sensation: SILT throughoutReflexes: 2+ throughout Cerebellum: no dysmetriaNo nystagmusNo babinskiNo clonusNo Hoffman's Lab ResultsRecent Labs   08/29/250000 08/28/250309 08/27/250917 WBC 12.2* 15.8* 17.7* Hematocrit 35* 35* 35* Platelets 229 265 270 Sodium 140 138 139 Potassium 4.1 5.1 5.5* CO2 23 23 27  No components found with this basename: BUN, OSMOLALITYImaging Studies:Lumbar MRI: Multiple enhancing vertebral and paraspinal muscle metastases, unchanged from prior. L4-L5 with disc bulge, facet hypertrophy, and ligamentum flavum  thickening causing mild-moderate canal stenosis and moderate-severe left foraminal stenosis. No cauda equina compression. MRI appears unchanged from previous MRI where disc bulge was not called on the final read.Thoracic MRI: Multiple vertebral and paraspinal metastases, with enlargement of T6 vertebral body and T7 spinous process lesions. No cord or canal compromise.Cervical MRI: Multiple enhancing vertebral metastases similar to prior (8/7). Mild epidural enhancement at C7 causing mild canal narrowing, without cord signal change.LLE Doppler: No DVT. Hypoechoic nodules in the left thigh consistent with metastatic disease.Currently Active/Followed [facility] Problems  Diagnosis   Weakness  Assessment:  62M with stage IV carcinoma and diffuse spinal metastases, admitted for BLE weakness, MRI showing progression of vertebral/paraspinal lesions and stable L4-5 disc bulge without cord or cauda compression; exam stable, no acute neurosurgical intervention.Plan: No acute neurosurgical intervention at this timeRest of care per primary teamNeurosurgery will sign off The neurosurgery attending is involved in clinical decision making for this patient. Case discussed with Dr. Rosalyn page the neurosurgery first call resident for any questions regarding this patient's careDate/Time the Neurosurgical Team Communicated Recommendations with Emergency Department or Primary Service: 11/08/2023  Thank you for consulting neurosurgery.Note created using Dragon Voice-to-Text software. Typographical and grammatical errors may exist.Author: Gwenda Cumber, MD as of: 11/08/2023  at: 6:32 AM  [1] Past Medical History:Diagnosis Date  Arthritis   CHF (congestive heart failure)   Colon polyp   Dialysis patient   ESRD (end stage renal disease)   GERD (gastroesophageal reflux disease)   Hypercholesterolemia   Hypotension    Ischemic colitis  Thyroid  disease   hypo [2] Past Surgical History:Procedure Laterality Date  DIALYSIS FISTULA CREATION Bilateral   4 fistulas have failed- ? d/t low BP and small veins  HERNIA REPAIR    PR LIGATION/BIOPSY TEMPORAL ARTERY Right 10/04/2023  Procedure: TEMPORAL ARTERY BIOPSY RIGHT EYE;  Surgeon: Lynn Ervin Edelman, MD;  Location: [facility] MAIN OR;  Service: Ophthalmology  Right inguinal hernia repair   [3] Family HistoryProblem Relation Name Age of Onset  Diabetes Mother    Asthma Mother    Coronary artery disease Father   [4] AllergiesAllergen Reactions  Penicillins Swelling and Rash   Facial swellingMed History Tech discussed Penicillin allergy with patient on 10/18/23. Please see Antimicrobial Stewardship Chart Note for additional details.  Amoxicillin Swelling and Rash   Facial swellingMed History Tech discussed Amoxicillin allergy with patient on 10/18/23. Please see Antimicrobial Stewardship Chart Note for additional details.  Baclofen Other (See Comments)   confusion  Coconut Nausea And Vomiting  No Known Latex Allergy

## 2023-11-08 NOTE — Discharge Instructions (Addendum)
 Brief Summary of Your Hospital Course:You were admitted from oncology clinic for concerns of weakness in both of your legs, as well as shortness of breath.  As we were concerned about potential compression of your spinal cord, we ordered an MRI of your spine.  That MRI of your spine did not show any compression of your cord, and we consulted the neurosurgery team to see if any intervention could be done to prevent your symptoms in the future, however there is no intervention that was indicated at this time.  For your shortness of breath, on the day you are admitted, we did a short ultrafiltration (hemodialysis) session in which we removed 1.5 L.  You then resumed your normal hemodialysis schedule the next day with 2 L removed.  With these interventions, your shortness of breath improved, which likely means that you had too much fluid on your body when you were admitted. We were able to wean you off oxygen, and with your lower extremity weakness improved, you were deemed safe for discharge. You also had a biopsy done of one of the areas on the spine which can be followed up with Dr. Bronson. You have met all the criteria for safe discharge. Thank you for entrusting us  with your care. Your instructions:- Please follow up with all appointments. - Please take medications as instructed. - Make sure your bowels are moving. Pain medications can be very constipating.- It is very important that upon discharge you continue to eat a well balanced diet, and drink plenty of fluids to help maintain your nutrition/hydration status. - You should also continue to participate in activity as tolerated to help maintain your strength. - It is important to practice proper hand hygiene, washing hands with soap and water  before and after meals and after using the bathroom. This will help prevent the spread of infection.What to do after you leave the hospital:Recommended diet: resume the diet you  were on prior to coming to the hospital Recommended activity: activity as toleratedWound Care: none neededIf you experience uncontrolled pain, chest pain, shortness of breath, fever greater than 100.5, poor urinary output, vomiting, severe nausea, diarrhea, blood in stool, loss of consciousness or new or worsening headache within the first 24 hours after discharge, please call Lizvet Chunn Ila Shallow, MD at [phone].If you experience uncontrolled pain, chest pain, shortness of breath, fever greater than 100.5, poor urinary output, vomiting, severe nausea, diarrhea, blood in stool, loss of consciousness or new or worsening headache more than 24 hours after discharge, please follow up with your primary care physician or Dr. Bronson at [phone].

## 2023-11-09 ENCOUNTER — Inpatient Hospital Stay

## 2023-11-09 ENCOUNTER — Ambulatory Visit

## 2023-11-09 ENCOUNTER — Other Ambulatory Visit

## 2023-11-09 NOTE — Progress Notes (Signed)
 11/09/23 1654 Post-Discharge Phone Call Was 24 hour post-discharge phone call completed? Yes Phone Call Comments Writer attempted to contact patient three times, twice on his mobile and once on his home phone throughout the day, there was no answer to any of the three

## 2023-11-10 ENCOUNTER — Encounter: Admitting: Nephrology

## 2023-11-10 DIAGNOSIS — N186 End stage renal disease: Secondary | ICD-10-CM

## 2023-11-10 DIAGNOSIS — Z992 Dependence on renal dialysis: Secondary | ICD-10-CM

## 2023-11-11 ENCOUNTER — Other Ambulatory Visit: Payer: Self-pay

## 2023-11-11 LAB — BLOOD CULTURE: Bacterial Blood Culture: 0

## 2023-11-12 ENCOUNTER — Ambulatory Visit

## 2023-11-12 ENCOUNTER — Encounter: Admitting: Radiation Oncology

## 2023-11-12 ENCOUNTER — Ambulatory Visit: Admitting: Radiation Oncology

## 2023-11-12 ENCOUNTER — Other Ambulatory Visit

## 2023-11-12 ENCOUNTER — Encounter: Attending: Radiation Oncology

## 2023-11-12 ENCOUNTER — Telehealth: Payer: Self-pay | Admitting: Radiation Oncology

## 2023-11-12 ENCOUNTER — Encounter: Payer: Self-pay | Admitting: Radiation Oncology

## 2023-11-12 ENCOUNTER — Encounter

## 2023-11-12 DIAGNOSIS — C799 Secondary malignant neoplasm of unspecified site: Secondary | ICD-10-CM

## 2023-11-12 DIAGNOSIS — N186 End stage renal disease: Secondary | ICD-10-CM

## 2023-11-12 DIAGNOSIS — C7951 Secondary malignant neoplasm of bone: Secondary | ICD-10-CM | POA: Insufficient documentation

## 2023-11-12 LAB — RAD ONC ARIA SESSION SUMMARY
Course Elapsed Days: 8
Course Fractions Treated: 4
Plan Fractions Treated to Date: 4
Plan Prescribed Dose Per Fraction: 400 cGY
Plan Total Prescribed Dose: 2000 cGy
Prescribed Total Fractions for Plan - Total: 5
Reference Point Dosage Given to Date: 1600 cGy
Reference Point Dosage Given to Date: 1617.57 cGy
Reference Point Session Dosage Given: 400 cGy
Reference Point Session Dosage Given: 404.39 cGy
Session Number: 4

## 2023-11-12 MED ORDER — DEXAMETHASONE 2 MG PO TABS *I*
2.0000 mg | ORAL_TABLET | Freq: Two times a day (BID) | ORAL | 0 refills | Status: AC
Start: 2023-11-12 — End: ?

## 2023-11-12 NOTE — Patient Instructions (Incomplete)
 Your Care Team          Phone:  [phone]      Fax:   [phone]        Physician:    Blaine Bump, MD      Nurse Practitioner: Bretta Camp, NP      Nurses:    Meriel Stank, RN     Marcelene Sep, RN      Secretary:    Lisa Rideau      Social Worker:   Marien Short, LMSW      Pharmacist:  Basilia Bosworth, PharmD    APPOINTMENT LOCATIONS:   [facility] - [address], Second Floor of Cancer Center  Tuesday's and Thursday's - [address], Lobby Floor of Cancer Center   Wednesday's- [address] (Dr. Cynthia Dries), [address] Bobbye Burrow, NP)    You can reach us  at [phone]. Please be prepared to let the secretary know why you are calling.  This will help let your nurse know how urgent the call is.   We will return your call as soon as possible, on clinic days it may take us  longer. After hours, weekends and holidays the phone is answered by the answering service. They will take a message and forward to the doctor on call who can access your chart and assist you. Please note that mychart messages are NOT monitored after business hours, on weekends or holidays. Please CALL for urgent matters.     Please call this same number if you need to cancel or re-schedule your appointment.  Please allow 5 days to re-fill any prescriptions and 5-10 days to complete disability or FMLA paperwork.

## 2023-11-12 NOTE — Telephone Encounter (Signed)
Noted and FYI sent to pcp.

## 2023-11-12 NOTE — Telephone Encounter (Signed)
 Patients wife called in to confirm appointments and questioned OTV that was schedueld for today @3 :45pm with Dr.Milano. Writer cancelled appointment since patient was treated at [facility] today.

## 2023-11-12 NOTE — Progress Notes (Signed)
 OTV Date: 8/25/25Pain: 0/10Energy Level: moderate fatigue GU: dislysis ptGI:appetite poor since steroids stopped Thoracic - dyspnea SkinHead &Neck: Systemic Therapy: noComments: Pt. BP low side , that is norm pr pt. , takes midodrine  tid .  HR tacchy , respirations labored , pt. States he was unable to tolerate full dialysis tx on sat . , to go tomorrow . Mild edema lower ext . , Pt. States he took his last steroid on Friday and can feel difference . Was going to reach out to med onc to discuss restarting . Encouraged pt. To seek urgent care or ED due to symptoms , refused . Wife with pt. Today at visit . OTV Date: 9/2/2025Pain: 0/10 at the moment. Since Sunday August 31 started with pain left leg with standing or walking. Refuses help at home, lives with wife. 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 Patient has stair liftEnergy Level: very poorSystemic Therapy: to see Dr. Bronson on 9/4.Comments:  patient does not wish to take Pain meds due to working.

## 2023-11-12 NOTE — Progress Notes (Deleted)
 Radiation Oncology: On treatment visit (OTV)      Radiation Treatments   Active Plans C_Spine_eCmp Most recent treatment: Dose planned: 400 cGy (fraction 3 on 11/08/2023) Total: Dose planned: 2,000 cGy (5 fractions) Elapsed Days: 4  Reference Points PTV_2000 Most recent treatment: Dose given: 400 cGy (on 11/08/2023) Total: Dose given: 1,200 cGy Elapsed Days: 4  Spine_Calc Most recent treatment: Dose given: 404 cGy (on 11/08/2023) Total: Dose given: 1,213 cGy Elapsed Days: 4     Diagnosis (including stage): stage IV renal carcinomaSite: C spineChart Reviewed: yes             Films Reviewed: yesSystemic therapy (agent, dose and schedule): noneSteroids: ***SUBJECTIVE 11/12/23: ***OBJECTIVE There were no vitals filed for this visit.There were no vitals taken for this visit.KPS 60 ***Exam:No skin erythema or desquamation in the treatment field ***New laboratory or imaging results:  Lab results: 08/29/250000 WBC 12.2* Hemoglobin 10.0* Hematocrit 35* RBC 3.7* Platelets 229   Chemistry    Lab results: 08/29/250000 Sodium 140 Potassium 4.1 Chloride 98 CO2 23 UN 23* Creatinine 4.22*    Lab results: 08/29/250000 08/27/250917 08/26/251829 Glucose 129*   < > 74 Calcium  8.2*   < > 8.2* Total Protein  --   --  6.1* Albumin  --   --  3.4* ALT  --   --  11 AST  --   --  78* Alk Phos  --   --  268* Bilirubin,Total  --   --  0.4  < > = values in this interval not displayed.  ASSESSMENT/PLANGreg Pearson is a 63 y.o. male who is receiving external beam radiation therapy for spinal metastasis from stage IV renal carcinoma.Assessment:Clinically tolerating radiation. ***Plan:Continue treatment course. ***Shane Pearson, MDPGY-2 ResidentRadiation Oncology9/2/2025The patient was also seen and evaluated by Dr. Virgia. Formal attending  attestation to follow.

## 2023-11-12 NOTE — Telephone Encounter (Signed)
 Received call from [facility] requesting to inform provider, the patients home care will be delayed until next Monday per patients request, as he has multiple appointments this week.

## 2023-11-12 NOTE — Progress Notes (Deleted)
 Diagnosis: Metastatic carcinoma of renal originStage at Diagnosis: IV Oncologic History7/19/25: Presented to emergency room with generalized malaise, pain, weight loss and failure to thrive.09/28/23 to 10/10/23: Admitted to hospital for workup.  Imaging studies consistent of CT shoulder, cervical spine, head, chest, abdomen and pelvis (without contrast); MRI cervical, thoracic and lumbar spine, and X-ray of bilateral femurs, which showed no acute radiographic finding in left shoulder and head.  There is moderate right and small left layering pleural effusion, multiple enlarged mediastinal lymph nodes, right chest wall subcutaneous soft tissue density nodules, a 2.6 x 1.7 cm right adrenal lesion with indeterminate attenuation, polycystic kidney disease, retroperitoneal gastrohepatic, and periportal lymphadenopathy, intramuscular lesions throughout the pelvis and paraspinal musculature, and osseous metastases in cervical, thoracic and lumbar spine.   IR biopsy of L5 lesion showed metastatic carcinoma of renal origin. Cytology of pleural fluid (dated 10/01/23) showed malignant tumor cells derived from carcinoma, supporting epithelial differentiation.  Bone scan showed diffuse background osseous uptake related to patient's renal osteodystrophy, uptake within the proximal right humerus, and predominantly cortical uptake within the proximal bilateral femurs.  Brain MRI showed numerous multifocal areas of punctate enhancement scattered throughout the brain parenchyma, consistent with intracranial metastatic disease. There was no significant mass effect and no evidence of associated hemorrhagic changes. 11/05/2023: ED for failure to thrive, worsening weakness and pain8/29/25: Underwent ultrasound-guided biopsy of lesion in left paraspinal musculature.  Pathology showed ***. History of Present IllnessGreg Pearson is a 63 y.o. man with polycystic kidney disease on hemodialysis, congestive heart failure, GERD,  and hypercholesterolemia, who presents for follow up for newly diagnosed stage IV metastatic carcinoma of renal origin.  *** His spouse, Shane Pearson, accompanied him throughout the visit.Since last visit, Shane Pearson was admitted to the hospital for management of symptoms.  Today, Shane Pearson reports ***. VitalsThere were no vitals filed for this visit.Physical ExaminationECOG Performance Status: 2 General: Patient appears cachetic, sitting in rolling walker. Respiratory: Decreased breath sounds bilaterally, short of breath Cardiovascular: Hearts sounds are regularGastrointestinal: Abdomen is soft, no tenderness, no distension  Musculoskeletal: No lower extremity swelling. Neurological: Sensation is grossly intact in upper and lower extremities.  3/5 proximal and distal lower extremity strenth.  3/5 left upper extremity strength due to pain in left shoulder joint  Laboratory  Lab results: 08/29/250000 WBC 12.2* Hemoglobin 10.0* Hematocrit 35* RBC 3.7* Platelets 229   Lab results: 08/29/250000 08/27/250917 08/26/251829 Sodium 140   < > 140 Potassium 4.1   < > 5.3* Chloride 98   < > 90* CO2 23   < > 28 UN 23*   < > 52* Creatinine 4.22*   < > 6.63* Glucose 129*   < > 74 Calcium  8.2*   < > 8.2* Total Protein  --   --  6.1* Albumin  --   --  3.4* ALT  --   --  11 AST  --   --  78* Alk Phos  --   --  268* Bilirubin,Total  --   --  0.4  < > = values in this interval not displayed. ImpressionStage IV metastatic carcinoma of renal origin. RecommendationGreg Pearson is a 63 y.o. man with polycystic kidney disease on hemodialysis, congestive heart failure, GERD, and hypercholesterolemia, who presents for follow up of newly diagnosed stage IV metastatic carcinoma of renal origin.  Today, I reviewed with Shane Pearson the result of his MRI cervical, thoracic and lumbar spine (dated 11/07/23), which showed progressive osseous lesions  in the spine, without spinal cord compression.  ***  For management of stage IV metastatic carcinoma of renal origin, I reviewed his clinical case at GU tumor board on 10/16/23.  According to the pathologist, Shane Pearson has poorly differentiated carcinoma likely of renal origin based on immuno-histochemical staining.  [facility] report stated specimen requirements were not met and result not reported. See Dr. Ginger note from today for further detail. On 11/08/23, Shane Pearson underwent a biopsy of the lesion in left paraspinal musculature.  Pathology showed ***.Today, I again reviewed with Shane Pearson that patients with autosomal dominant polycystic kidney disease may have higher risk of developing renal cell carcinoma.  In the case of Shane Pearson, Shane Pearson is uncertain if he has autosomal dominant polycystic kidney disease.  Today, I recommend referring Shane Pearson to [facility] hereditary genetics clinic for evaluation. ***For management of possible intracranial metastases, I reviewed his clinical case with Dr. Virgia from radiation oncology, who recommended SRS versus surveillance with short interval scans in 6 to 8 weeks.  I will order him a MRI brain today. ***For management of cervical spine 7 osseous lesion, Shane Pearson is undergoing radiation treatment by Dr. Arminda. ***In regard to his hypotension, on review of his medical record, his blood pressures usually run in the range of 90s/50s-60s.  Shane Pearson will ***.  In the interim, I instructed Shane Pearson to call my office with any questions or concerns.Shane Pearson, MDProfessor of Medicine and Hydrographic surveyor of Solid Tumor Oncology ProgramJames P. Texhoma Cancer InstituteDivision of Hematology/OncologyDepartment of Medicine

## 2023-11-12 NOTE — Progress Notes (Signed)
 Radiation Treatments   Active Plans C_Spine_eCmp Most recent treatment: Dose planned: 400 cGy (fraction 4 on 11/12/2023) Total: Dose planned: 2,000 cGy (5 fractions) Elapsed Days: 8  Reference Points PTV_2000 Most recent treatment: Dose given: 400 cGy (on 11/12/2023) Total: Dose given: 1,600 cGy Elapsed Days: 8  Spine_Calc Most recent treatment: Dose given: 404 cGy (on 11/12/2023) Total: Dose given: 1,618 cGy Elapsed Days: 8      Performance Status: 2 - Symptomatic, <50% confined to bedChart Reviewed: yes              Films Reviewed: yesChemotherapy nVitals: There were no vitals filed for this visit.S: Shane Pearson has no complaint of pain at this moment.  However he has on and off pains on C7 and lower back which he was recently evaluated in the hospital by MRI of spine.  The MRI against cord compression showed progression of disease.  He also have a pare spinal soft tissue biopsy pending result.Fatigue: 3Weakness: yes  left armAppetite: stable                          Nutrition: eats three meals a dayO. Wt:     Skin: Gr 0: no notable skin changesUnable to raise left arm since he ran out of DecadronA/P: 1. RT: continue        2. Nutrition: continue current regimen        3. Pain Control: without any analgesics. and palliative care referral made today        4. Skin Care: none      Neurologic symptoms: I put him back on Decadron  2 mg twice a day for now.  He will finish his radiation this week.   He has multiple medical issues which we will leave him in the capable hands of our medical oncology and palliative care team

## 2023-11-13 ENCOUNTER — Emergency Department

## 2023-11-13 ENCOUNTER — Inpatient Hospital Stay
Admission: EM | Admit: 2023-11-13 | Discharge: 2023-12-11 | DRG: 480 | Disposition: E | Source: Ambulatory Visit | Attending: Pulmonary and Critical Care Medicine | Admitting: Pulmonary and Critical Care Medicine

## 2023-11-13 ENCOUNTER — Encounter

## 2023-11-13 ENCOUNTER — Ambulatory Visit: Payer: Self-pay | Admitting: Internal Medicine

## 2023-11-13 ENCOUNTER — Ambulatory Visit

## 2023-11-13 ENCOUNTER — Other Ambulatory Visit: Payer: Self-pay

## 2023-11-13 ENCOUNTER — Ambulatory Visit: Attending: Internal Medicine | Admitting: Internal Medicine

## 2023-11-13 ENCOUNTER — Ambulatory Visit
Admission: RE | Admit: 2023-11-13 | Discharge: 2023-11-13 | Disposition: A | Discharge status: Home / Self Care | Source: Ambulatory Visit

## 2023-11-13 VITALS — BP 88/76 | HR 88 | Temp 97.5°F | Resp 17

## 2023-11-13 DIAGNOSIS — Z993 Dependence on wheelchair: Secondary | ICD-10-CM

## 2023-11-13 DIAGNOSIS — C649 Malignant neoplasm of unspecified kidney, except renal pelvis: Secondary | ICD-10-CM | POA: Diagnosis present

## 2023-11-13 DIAGNOSIS — S42291A Other displaced fracture of upper end of right humerus, initial encounter for closed fracture: Principal | ICD-10-CM

## 2023-11-13 DIAGNOSIS — R9431 Abnormal electrocardiogram [ECG] [EKG]: Secondary | ICD-10-CM | POA: Diagnosis present

## 2023-11-13 DIAGNOSIS — J811 Chronic pulmonary edema: Secondary | ICD-10-CM

## 2023-11-13 DIAGNOSIS — M1990 Unspecified osteoarthritis, unspecified site: Secondary | ICD-10-CM | POA: Diagnosis present

## 2023-11-13 DIAGNOSIS — E875 Hyperkalemia: Secondary | ICD-10-CM | POA: Diagnosis present

## 2023-11-13 DIAGNOSIS — R627 Adult failure to thrive: Secondary | ICD-10-CM | POA: Diagnosis present

## 2023-11-13 DIAGNOSIS — Z515 Encounter for palliative care: Secondary | ICD-10-CM

## 2023-11-13 DIAGNOSIS — M84421A Pathological fracture, right humerus, initial encounter for fracture: Principal | ICD-10-CM | POA: Diagnosis present

## 2023-11-13 DIAGNOSIS — J90 Pleural effusion, not elsewhere classified: Secondary | ICD-10-CM

## 2023-11-13 DIAGNOSIS — I951 Orthostatic hypotension: Secondary | ICD-10-CM | POA: Diagnosis present

## 2023-11-13 DIAGNOSIS — A419 Sepsis, unspecified organism: Secondary | ICD-10-CM | POA: Diagnosis not present

## 2023-11-13 DIAGNOSIS — W19XXXA Unspecified fall, initial encounter: Secondary | ICD-10-CM

## 2023-11-13 DIAGNOSIS — D630 Anemia in neoplastic disease: Secondary | ICD-10-CM | POA: Diagnosis present

## 2023-11-13 DIAGNOSIS — J910 Malignant pleural effusion: Secondary | ICD-10-CM | POA: Diagnosis present

## 2023-11-13 DIAGNOSIS — G9340 Encephalopathy, unspecified: Secondary | ICD-10-CM | POA: Diagnosis not present

## 2023-11-13 DIAGNOSIS — C4021 Malignant neoplasm of long bones of right lower limb: Secondary | ICD-10-CM

## 2023-11-13 DIAGNOSIS — K921 Melena: Secondary | ICD-10-CM | POA: Diagnosis not present

## 2023-11-13 DIAGNOSIS — C7949 Secondary malignant neoplasm of other parts of nervous system: Secondary | ICD-10-CM | POA: Diagnosis present

## 2023-11-13 DIAGNOSIS — D849 Immunodeficiency, unspecified: Secondary | ICD-10-CM | POA: Diagnosis present

## 2023-11-13 DIAGNOSIS — S4991XA Unspecified injury of right shoulder and upper arm, initial encounter: Secondary | ICD-10-CM

## 2023-11-13 DIAGNOSIS — C781 Secondary malignant neoplasm of mediastinum: Secondary | ICD-10-CM | POA: Diagnosis present

## 2023-11-13 DIAGNOSIS — S42201A Unspecified fracture of upper end of right humerus, initial encounter for closed fracture: Secondary | ICD-10-CM

## 2023-11-13 DIAGNOSIS — R0600 Dyspnea, unspecified: Secondary | ICD-10-CM

## 2023-11-13 DIAGNOSIS — I493 Ventricular premature depolarization: Secondary | ICD-10-CM

## 2023-11-13 DIAGNOSIS — Y92009 Unspecified place in unspecified non-institutional (private) residence as the place of occurrence of the external cause: Secondary | ICD-10-CM

## 2023-11-13 DIAGNOSIS — E785 Hyperlipidemia, unspecified: Secondary | ICD-10-CM | POA: Diagnosis present

## 2023-11-13 DIAGNOSIS — Z87891 Personal history of nicotine dependence: Secondary | ICD-10-CM

## 2023-11-13 DIAGNOSIS — C7951 Secondary malignant neoplasm of bone: Secondary | ICD-10-CM

## 2023-11-13 DIAGNOSIS — Y998 Other external cause status: Secondary | ICD-10-CM

## 2023-11-13 DIAGNOSIS — E2740 Unspecified adrenocortical insufficiency: Secondary | ICD-10-CM | POA: Diagnosis present

## 2023-11-13 DIAGNOSIS — Z66 Do not resuscitate: Secondary | ICD-10-CM | POA: Diagnosis not present

## 2023-11-13 DIAGNOSIS — E662 Morbid (severe) obesity with alveolar hypoventilation: Secondary | ICD-10-CM | POA: Diagnosis present

## 2023-11-13 DIAGNOSIS — C782 Secondary malignant neoplasm of pleura: Secondary | ICD-10-CM | POA: Diagnosis present

## 2023-11-13 DIAGNOSIS — K219 Gastro-esophageal reflux disease without esophagitis: Secondary | ICD-10-CM | POA: Diagnosis present

## 2023-11-13 DIAGNOSIS — S42331A Displaced oblique fracture of shaft of humerus, right arm, initial encounter for closed fracture: Secondary | ICD-10-CM

## 2023-11-13 DIAGNOSIS — Y9389 Activity, other specified: Secondary | ICD-10-CM

## 2023-11-13 DIAGNOSIS — N186 End stage renal disease: Secondary | ICD-10-CM

## 2023-11-13 DIAGNOSIS — R918 Other nonspecific abnormal finding of lung field: Secondary | ICD-10-CM

## 2023-11-13 DIAGNOSIS — I517 Cardiomegaly: Secondary | ICD-10-CM

## 2023-11-13 DIAGNOSIS — B952 Enterococcus as the cause of diseases classified elsewhere: Secondary | ICD-10-CM | POA: Diagnosis not present

## 2023-11-13 DIAGNOSIS — C7989 Secondary malignant neoplasm of other specified sites: Secondary | ICD-10-CM | POA: Diagnosis present

## 2023-11-13 DIAGNOSIS — K810 Acute cholecystitis: Secondary | ICD-10-CM | POA: Diagnosis not present

## 2023-11-13 DIAGNOSIS — I509 Heart failure, unspecified: Secondary | ICD-10-CM

## 2023-11-13 DIAGNOSIS — R578 Other shock: Secondary | ICD-10-CM | POA: Diagnosis not present

## 2023-11-13 DIAGNOSIS — S7290XA Unspecified fracture of unspecified femur, initial encounter for closed fracture: Secondary | ICD-10-CM

## 2023-11-13 DIAGNOSIS — I132 Hypertensive heart and chronic kidney disease with heart failure and with stage 5 chronic kidney disease, or end stage renal disease: Secondary | ICD-10-CM | POA: Diagnosis present

## 2023-11-13 DIAGNOSIS — E8809 Other disorders of plasma-protein metabolism, not elsewhere classified: Secondary | ICD-10-CM | POA: Diagnosis present

## 2023-11-13 DIAGNOSIS — R6521 Severe sepsis with septic shock: Secondary | ICD-10-CM | POA: Diagnosis not present

## 2023-11-13 DIAGNOSIS — E872 Acidosis, unspecified: Secondary | ICD-10-CM | POA: Diagnosis not present

## 2023-11-13 DIAGNOSIS — I5032 Chronic diastolic (congestive) heart failure: Secondary | ICD-10-CM | POA: Diagnosis present

## 2023-11-13 DIAGNOSIS — M84521A Pathological fracture in neoplastic disease, right humerus, initial encounter for fracture: Secondary | ICD-10-CM

## 2023-11-13 DIAGNOSIS — Z992 Dependence on renal dialysis: Secondary | ICD-10-CM

## 2023-11-13 DIAGNOSIS — I70209 Unspecified atherosclerosis of native arteries of extremities, unspecified extremity: Secondary | ICD-10-CM | POA: Diagnosis present

## 2023-11-13 DIAGNOSIS — Z6825 Body mass index (BMI) 25.0-25.9, adult: Secondary | ICD-10-CM

## 2023-11-13 DIAGNOSIS — C7931 Secondary malignant neoplasm of brain: Secondary | ICD-10-CM | POA: Diagnosis present

## 2023-11-13 DIAGNOSIS — I959 Hypotension, unspecified: Secondary | ICD-10-CM | POA: Diagnosis present

## 2023-11-13 DIAGNOSIS — E871 Hypo-osmolality and hyponatremia: Secondary | ICD-10-CM | POA: Diagnosis present

## 2023-11-13 DIAGNOSIS — J9601 Acute respiratory failure with hypoxia: Secondary | ICD-10-CM | POA: Diagnosis not present

## 2023-11-13 DIAGNOSIS — D631 Anemia in chronic kidney disease: Secondary | ICD-10-CM | POA: Diagnosis present

## 2023-11-13 LAB — EKG 12-LEAD
P: 38 deg
PR: 155 ms
QRS: 31 deg
QRSD: 89 ms
QT: 469 ms
QTc: 563 ms
Rate: 86 {beats}/min
T: 31 deg

## 2023-11-13 LAB — NT-PRO BNP: NT-pro BNP: 2737 pg/mL — ABNORMAL HIGH (ref 0–900)

## 2023-11-13 LAB — COVID/INFLUENZA A & B/RSV NAAT (PCR)
COVID-19 NAAT (PCR): NEGATIVE
Influenza A NAAT (PCR): NEGATIVE
Influenza B NAAT (PCR): NEGATIVE
RSV NAAT (PCR): NEGATIVE

## 2023-11-13 LAB — BASIC METABOLIC PANEL
Anion Gap: 25 — ABNORMAL HIGH (ref 7–16)
CO2: 22 mmol/L (ref 20–28)
Calcium: 7.8 mg/dL — ABNORMAL LOW (ref 8.6–10.2)
Chloride: 88 mmol/L — ABNORMAL LOW (ref 96–108)
Creatinine: 6.66 mg/dL — ABNORMAL HIGH (ref 0.67–1.17)
Glucose: 92 mg/dL (ref 60–99)
Lab: 48 mg/dL — ABNORMAL HIGH (ref 6–20)
Potassium: 5.3 mmol/L — ABNORMAL HIGH (ref 3.3–5.1)
Sodium: 135 mmol/L (ref 133–145)
eGFR BY CREAT: 9 — AB

## 2023-11-13 LAB — RAD ONC ARIA SESSION SUMMARY
Course Elapsed Days: 9
Course Fractions Treated: 5
Plan Fractions Treated to Date: 5
Plan Prescribed Dose Per Fraction: 400 cGY
Plan Total Prescribed Dose: 2000 cGy
Prescribed Total Fractions for Plan - Total: 5
Reference Point Dosage Given to Date: 2000 cGy
Reference Point Dosage Given to Date: 2021.96 cGy
Reference Point Session Dosage Given: 400 cGy
Reference Point Session Dosage Given: 404.39 cGy
Session Number: 5

## 2023-11-13 LAB — CBC AND DIFFERENTIAL
Baso # K/uL: 0.1 THOU/uL (ref 0.0–0.2)
Eos # K/uL: 0 THOU/uL (ref 0.0–0.5)
Hematocrit: 36 % — ABNORMAL LOW (ref 37–52)
Hemoglobin: 10.4 g/dL — ABNORMAL LOW (ref 12.0–17.0)
IMM Granulocytes #: 0.1 THOU/uL — ABNORMAL HIGH
IMM Granulocytes: 1 %
Lymph # K/uL: 0.7 THOU/uL — ABNORMAL LOW (ref 1.0–5.0)
MCV: 92 fL (ref 75–100)
Mono # K/uL: 0.7 THOU/uL (ref 0.1–1.0)
Neut # K/uL: 9.4 THOU/uL — ABNORMAL HIGH (ref 1.5–6.5)
Nucl RBC # K/uL: 0.1 THOU/uL (ref 0.0–0.1)
Nucl RBC %: 1.3 /100{WBCs} — ABNORMAL HIGH (ref 0.0–0.2)
Platelets: 337 THOU/uL (ref 150–450)
RBC: 3.9 MIL/uL — ABNORMAL LOW (ref 4.0–6.0)
RDW: 23.6 % — ABNORMAL HIGH (ref 0.0–15.0)
Seg Neut %: 85.8 %
WBC: 10.9 THOU/uL (ref 3.5–11.0)

## 2023-11-13 LAB — MAGNESIUM: Magnesium: 1.8 mg/dL (ref 1.6–2.5)

## 2023-11-13 LAB — BLOOD BANK HOLD PINK

## 2023-11-13 LAB — HOLD BLUE

## 2023-11-13 MED ORDER — HYDROMORPHONE HCL PF 1 MG/ML IJ SOLN *WRAPPED*
0.2500 mg | INTRAMUSCULAR | Status: DC | PRN
Start: 2023-11-13 — End: 2023-11-16

## 2023-11-13 MED ORDER — OXYCODONE HCL 5 MG PO TABS *I*
5.0000 mg | ORAL_TABLET | Freq: Four times a day (QID) | ORAL | Status: DC | PRN
Start: 2023-11-13 — End: 2023-11-13
  Administered 2023-11-13: 5 mg via ORAL
  Filled 2023-11-13: qty 1

## 2023-11-13 MED ORDER — ACETAMINOPHEN 325 MG PO TABS *I*
650.0000 mg | ORAL_TABLET | Freq: Once | ORAL | Status: AC
Start: 2023-11-13 — End: 2023-11-13
  Administered 2023-11-13: 650 mg via ORAL

## 2023-11-13 MED ORDER — OXYCODONE HCL 5 MG PO TABS *I*
2.5000 mg | ORAL_TABLET | Freq: Four times a day (QID) | ORAL | Status: DC | PRN
Start: 2023-11-13 — End: 2023-11-13

## 2023-11-13 MED ORDER — SODIUM CHLORIDE 0.9 % FLUSH FOR PUMPS *I*
0.0000 mL/h | INTRAVENOUS | Status: DC | PRN
Start: 2023-11-13 — End: 2023-11-26

## 2023-11-13 MED ORDER — HYDROMORPHONE HCL PF 1 MG/ML IJ SOLN *WRAPPED*
0.5000 mg | INTRAMUSCULAR | Status: DC | PRN
Start: 2023-11-13 — End: 2023-11-16
  Administered 2023-11-14 (×2): 0.5 mg via INTRAVENOUS
  Filled 2023-11-13 (×2): qty 0.5

## 2023-11-13 MED ORDER — DEXTROSE 5 % FLUSH FOR PUMPS *I*
0.0000 mL/h | INTRAVENOUS | Status: DC | PRN
Start: 2023-11-13 — End: 2023-11-26

## 2023-11-13 MED ORDER — NALOXONE HCL 0.4 MG/ML IJ SOLN *WRAPPED*
0.0800 mg | Status: DC | PRN
Start: 2023-11-13 — End: 2023-11-25
  Administered 2023-11-24 (×2): 0.08 mg via INTRAVENOUS
  Filled 2023-11-13 (×2): qty 1

## 2023-11-13 MED ORDER — HYDROMORPHONE HCL PF 1 MG/ML IJ SOLN *WRAPPED*
0.2500 mg | INTRAMUSCULAR | Status: DC | PRN
Start: 2023-11-13 — End: 2023-11-13

## 2023-11-13 NOTE — H&P (Incomplete)
 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 Supportive family at bedside. Patient had a mechanical fall earlier today. His walker was caught on a rug and he reached down to try to lift the walker and ended up hitting his right shoulder on the walker/washer. He heard a pop come from his right shoulder and had pain with decreased ROM. EMS brought him to urgent care. XR at [facility] showed a spiral humerus fracture. He says if he stays still he does not have pain. Any movement of the right arm causes significant pain. Had recent admission 8/26-8/29 for weakness concerning for disease progression and cord compression. MRIs were reassuring against cord compression but did show concern for disease progression of vertebral and paraspinal mets. He was treated with C spine radiation and IR biopsy completed of paraspinal mets which is still pending. He also had SOB due to pulmonary edema that improved with an additional HD session. Since discharge he has been feeling great. Denies SOB, BLE swelling improving. He is understandably upset to be back admitted to hospital. 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 ROS: Negative unless stated above Oncology History  No problem history exists. Active ProblemsPatient Active Problem List Diagnosis Code  ESRD (end stage renal disease) on dialysis N18.6, Z99.2  HLD (hyperlipidemia) E78.5  Hypotension I95.9  Ischemic colitis K55.9  Allergic rhinitis J30.9  Arteriosclerosis of artery of extremity I70.209  Degeneration of intervertebral disc of lumbar region  M51.369  Embolism due to vascular prosthetic devices, implants and grafts, initial encounter T82.818A  Spondylosis of lumbosacral region without myelopathy or radiculopathy M47.817  Diarrhea R19.7  Failure to thrive in adult R62.7  History of temporal artery biopsy Z98.890  Leg weakness R29.898  Other closed displaced fracture of proximal end of right humerus, initial encounter S42.291A PMHPast Medical History[1]Surgical HistoryPast Surgical History[2]MedsCurrent Facility-Administered Medications Medication Dose Route Frequency  sodium chloride  0.9 % FLUSH REQUIRED IF PATIENT HAS IV  0-500 mL/hr Intravenous PRN  dextrose  5 % FLUSH REQUIRED IF PATIENT HAS IV  0-500 mL/hr Intravenous PRN  naloxone  (NARCAN ) 0.4 mg/mL injection 0.08 mg  0.08 mg Intravenous PRN  HYDROmorphone  PF (DILAUDID ) injection 0.5 mg  0.5 mg Intravenous Q2H PRN  Or  HYDROmorphone  PF (DILAUDID ) injection 0.25 mg  0.25 mg Intravenous Q2H PRN Current Outpatient Medications Medication  dexAMETHasone  (DECADRON ) 2 MG tablet  Commode, adjustable 3-in-1  acetaminophen  (TYLENOL ) 500 mg tablet  midodrine  (PROAMATINE ) 10 mg tablet  pantoprazole  (PROTONIX ) 40 mg EC tablet  walker  magnesium  oxide (MAGNESIUM  OXIDE -MG SUPPLEMENT) 400 (241.3 mg) mg tablet  aspirin  81 MG EC tablet  fluticasone  (FLONASE ) 50 MCG/ACT nasal spray  atorvastatin  (LIPITOR) 20 mg tablet  fludrocortisone  (FLORINEF ) 0.1 mg tablet  b complex-vitamin c-folic acid  (NEPHRO-VITE) tablet  calcium -vitamin D  (OSCAL-500) 500-200 MG-UNIT per tablet  cinacalcet  (SENSIPAR ) 30 MG tablet AllergiesAllergies[3]Social HistorySocial History[4]Family HistoryFamily History[5]Review of Systems Constitutional:  Negative for chills and fever. HENT:  Negative for congestion.  Respiratory:  Negative for shortness of breath.  Cardiovascular:  Negative for chest pain and leg swelling.  Gastrointestinal:  Negative for constipation, diarrhea, nausea and vomiting. Neurological:  Positive for loss of consciousness and weakness. Physical  Exam BP 116/76   Pulse 84   Temp 36.5 C (97.7 F)   Resp 18   Ht 1.753 m (5' 9)   Wt 72.6 kg (160 lb)   SpO2 (S) 94%   BMI 23.63 kg/m Constitutional: Oriented to person, place, and time. Well-developed and well-nourished.Head: Normocephalic and atraumatic. Oropharynx clear.Eyes: Conjunctivae are normal. Pupils are equal, roundNeck: Normal range of motion. Neck supple. No tracheal deviation present. Cardiovascular: Normal rate and regular rhythm.  Pulmonary/Chest: Effort normal, decreased breath sounds at bases. No WOBAbdominal: Distended but soft. No tenderness. Musculoskeletal: 1+ BLE edema. RUE with sling in place, 1-2s cap refill, sensation intactNeuro: alert and interactive, no focal deficit Psychiatric: Normal mood and affect. Behavior is normal. Judgment and thought content normal. Imaging:   * Humerus RIGHT standard AP and  Lateral viewsResult Date: 9/3/2025Fracture of the proximal humeral shaft at the level of the deltoid tuberosity with lateral and anterior displacement of the distal fragment as well as apex anterior angulation END OF IMPRESSION I have personally reviewed the images and the Resident's/Fellow's interpretation and agree with or edited the findings. *Chest standard frontal and lateral viewsResult Date: 9/3/2025Increased pulmonary edema. Trace right and moderate to large left pleural fluid, increased compared to prior study, with associated atelectasis. Superimposed infection is not excluded. END OF IMPRESSION I have personally reviewed the images and the Resident's/Fellow's interpretation and agree with or edited the findings. EKG 12 leadResult Date: 9/3/2025Sinus rhythm Ventricular premature complex Low voltage, extremity and precordial leads Prolonged QT interval Poor  quality data, interpretation may be affectedShoulder RIGHT 3 or more viewsResult Date: 9/3/2025Mildly displaced proximal humeral shaft fracture. Diffuse osseous sclerosis. Central catheter terminates at the cavoatrial junction. Right-sided pleural effusion. Patchy airspace opacities in the right lower lung zone. Possible left-sided pleural effusion. END OF IMPRESSION I have personally reviewed the images and the Resident's/Fellow's interpretation and agree with or edited the findings. Labs:Recent Results (from the past 24 hours) Rad [facility] [place] Session Summary  Collection Time: 11/13/23  2:33 PM Result Value Ref Range  Course ID [email or web address]   Course Intent Palliative   Course Start Date 10/21/2023  2:37 PM   Session Number 5   First Treatment Date 11/04/2023  2:42 PM   Last Treatment Date 11/13/2023  2:30 PM   Course Elapsed Days 9   Course Fractions Treated 5   Reference Point ID [email or web address]   Reference Point Dosage Given to Date 2,000.00 cGy  Reference Point Session Dosage Given 400.00 cGy  Reference Point ID [email or web address]   Reference Point Dosage Given to Date 2,021.96 cGy  Reference Point Session Dosage Given 404.39 cGy  Plan ID [email or web address]   Plan Fractions Treated to Date 5   Prescribed Total Fractions for Plan - Total 5   Plan Prescribed Dose Per Fraction 400.00 cGY  Plan Total Prescribed Dose 2,000 cGy  Primary Reference Point [email or web address]  EKG 12 lead  Collection Time: 11/13/23  7:08 PM Result Value Ref Range  Rate 86 bpm  PR 155 ms  P 38 deg  QRSD 89 ms  QT 469 ms  QTc 563 ms  QRS 31 deg  T 31 deg CBC and differential  Collection Time: 11/13/23  7:59 PM Result Value Ref Range  WBC 10.9 3.5 - 11.0 THOU/uL  RBC 3.9 (L) 4.0 - 6.0 MIL/uL  Hemoglobin 10.4 (L) 12.0 - 17.0 g/dL  Hematocrit 36 (L) 37 - 52 %  MCV 92 75 - 100 fL  RDW 23.6 (H) 0.0 - 15.0 %  Platelets 337 150 - 450 THOU/uL  Seg Neut % 85.8 %  Neut # K/uL 9.4  (H) 1.5 - 6.5 THOU/uL  Lymph # K/uL 0.7 (L) 1.0 - 5.0 THOU/uL  Mono # K/uL 0.7 0.1 - 1.0 THOU/uL  Eos # K/uL 0.0 0.0 - 0.5 THOU/uL  Baso # K/uL 0.1 0.0 - 0.2 THOU/uL  Nucl RBC % 1.3 (H) 0.0 - 0.2 /100 WBC  Nucl RBC # K/uL 0.1 0.0 - 0.1 THOU/uL  IMM Granulocytes # 0.1 (H) THOU/uL  IMM Granulocytes 1.0 % Basic metabolic panel  Collection Time: 11/13/23  7:59 PM Result Value Ref Range  Glucose 92 60 - 99 mg/dL  Sodium 864 866 - 854 mmol/L  Potassium 5.3 (H) 3.3 - 5.1 mmol/L  Chloride 88 (L) 96 - 108 mmol/L  CO2 22 20 - 28 mmol/L  Anion Gap 25 (H) 7 - 16  UN 48 (H) 6 - 20 mg/dL  Creatinine 3.33 (H) 9.32 - 1.17 mg/dL  eGFR BY CREAT 9 (!)   Calcium  7.8 (L) 8.6 - 10.2 mg/dL Magnesium   Collection Time: 11/13/23  7:59 PM Result Value Ref Range  Magnesium  1.8 1.6 - 2.5 mg/dL COVID/Influenza A & B/RSV NAAT (PCR)  Collection Time: 11/13/23  8:04 PM Result Value Ref Range  COVID-19 Source  Nasopharyngeal   COVID-19 NAAT (PCR) NEGATIVE Negative  Influenza A NAAT (PCR) NEGATIVE Negative  Influenza B NAAT (PCR) NEGATIVE Negative  RSV NAAT (PCR) NEGATIVE Negative Blood bank hold pink  Collection Time: 11/13/23  9:34 PM Result Value Ref Range  Blood Bank Hold Pink Pink in Bld Bnk  Hold blue  Collection Time: 11/13/23  9:34 PM Result Value Ref Range  Hold Blue HOLD TUBE  ASSESSMENT/PLAN: Shane Pearson is a 63 year old male with past medical history of 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  Stage IV carcinoma, likely renal cell-Metastasis to brain and bone-Followed outpatient by Dr. Tenna 5 fractions of radiation to the cervical spine 9/3, Continue on decadron  2mg  BID for neurologic symptoms per rad onc-IR  Paraspinal muscle met biopsy pending  Mechanical fall resulting in R spiral humeral shaft fracture, concern for pathologic fx -In sling from urgent care -R shoulder and Humerus XR as above-R elbow XR pending -Ortho consulted, recs pending -CT R humerus STAT pending - discussed with radiology about stat status -NPO-Pain control -Tylenol  1g TID  -IV dilaudid  0.25/0.5mg  every 2 hours PRN  -Decadron  as above Moderate to Large L Pleural Effusion, Pulmonary Edema-Transiently hypoxic to 89% but now satting well on RA and is asymptomatic -Hx of malignant pleural effusion, consider thora -Fluid removal with HD Prolonged Qtc-PTA mag supp -Recheck PRN Hypotension-Continue PTA midodrine  and florinef  CHFHyperlipidemia-Hold home Aspirin  pending ortho recs -Home Atorvastatin  ESRD on HD TTS-Nephro-Vite-Cinacalcet -Consult placed for chronic dialysis  GERD-Pantoprazole  40mg  daily  FEN-NPO-Labs daily, replete electrolytes prn-NPO pending ortho recs  DVT PPx: IPCs pending ortho recs  Code Status: Full CodeDispo: pending above, will need PT OT consults pending ortho plan ? Author: DENA Namiah Dunnavant, PA  as of: 11/13/2023  at: 11:22 PMHMD Attending Addendum:I have seen and evaluated the patient and reviewed pertinent clinical documentation including EM provider/subspecialist and nursing notes, imaging, laboratory and microbiology data, and medication administration and dosing schedules.  I agree with the PA's history (including HPI, PMHx/PSHx, medications, allergies, family history,  social history and ROS), physical examination and plan of care as outlined above.  Included below are my additional comments and reiterations:SUBJECTIVE:-Shane Pearson reported that he continues to have pain in the right upper arm with nearly any movement.-He denies acute pain in any other extremities.PHYSICAL EXAM as above with no significant  additional findings: Vital signs reviewed.LABS:  Lab results: 09/03/251959 08/29/250000 08/28/250309 08/27/250917 08/26/251829 08/08/250048 08/07/251644 07/31/250823 07/30/250536 07/29/250743 WBC 10.9 12.2* 15.8*   < > 22.5* 11.5*   < > 13.8* 14.3* 12.9* Hemoglobin 10.4* 10.0* 10.3*   < > 11.0* 9.3*   < > 9.4* 9.8* 9.3* Hematocrit 36* 35* 35*   < > 36* 32*   < > 32* 33* 31* RBC 3.9* 3.7* 3.8*   < > 4.1 3.7*   < > 3.9* 4.0 3.7* Platelets 337 229 265   < > 294 478*   < > 538* 512* 494* Neut # K/uL 9.4*  --   --   --  18.8* 8.7*   < > 10.6* 11.6* 10.6* Lymph # K/uL 0.7*  --   --   --  1.4 1.2   < > 1.4 1.4 0.5* Mono # K/uL 0.7  --   --   --  1.5* 1.2*   < > 1.1* 1.1* 1.0 Eos # K/uL 0.0  --   --   --  0.3 0.2   < > 0.4 0.1 0.3 Baso # K/uL 0.1  --   --   --  0.0 0.1   < > 0.1 0.1 0.2 Seg Neut % 85.8  --   --   --  83.6 75.4   < > 77.4 77.5 81.5 Bands %  --   --   --   --   --   --   --   --  3  --  Myelocyte %  --   --   --   --   --   --   --  1*  --  1* Metamyelocyte %  --   --   --   --   --   --   --   --   --  1  < > = values in this interval not displayed.   Lab results: 09/04/250625 09/03/251959 08/29/250000 Sodium 132* 135 140 Potassium 5.6* 5.3* 4.1 Chloride 87* 88* 98 CO2 20 22 23  UN 56* 48* 23* Creatinine 7.53* 6.66* 4.22* Glucose 82 92 129* Calcium  7.9* 7.8* 8.2*   Lab results: 09/04/250625 07/26/250548 07/25/250300 Total Protein 5.8*   < > 6.0* Albumin 3.2*   < > 3.1* ALT 9   < > 7 AST 40   < > 37 Alk Phos 279*   < > 187* Bilirubin,Total 0.3   < > 0.2 Bilirubin,Direct <0.2   < > <0.2 GGT  --   --  29  < > = values in this interval not displayed. IMAGING:* Humerus LEFT standard AP and  Lateral viewsResult Date: 9/4/2025No acute osseous abnormality in the left humerus. Sequela of chronic rotator cuff disease with bony irregularity of the greater tuberosity, as seen on prior imaging.  Stable mild acromioclavicular arthrosis and mild glenohumeral arthrosis. END OF IMPRESSION * Femur RIGHT standard AP and LateralResult Date: 9/4/2025Patchy areas of lucency in the proximal femur and sclerosis in the ischium, similar to prior. No pathologic fracture. No acute osseous findings. END OF IMPRESSION* Femur LEFT standard AP and LateralResult Date: 9/4/2025Patchy sclerotic and lucent lesions in the proximal femur and  ischium, grossly unchanged compared to prior radiographic imaging. No pathologic fracture. No acute osseous abnormality. END OF IMPRESSION CT humerus RIGHT with contrastResult Date: 9/4/2025Slightly comminuted displaced and foreshortened oblique fracture through the proximal humeral diaphysis with posterior displacement. Associated adjacent soft tissue hematoma. Arthritic changes of the glenohumeral joint with subcortical cystic changes and possible erosions. Degenerative changes of the acromioclavicular joint prominent right axillary node measuring 1 cm, nonspecific, correlate clinically END OF IMPRESSION * Elbow RIGHT standard AP, Lateral, and both ObliquesResult Date: 9/4/2025No acute fracture or dislocation. END OF IMPRESSION* Humerus RIGHT standard AP and  Lateral viewsResult Date: 9/3/2025Fracture of the proximal humeral shaft at the level of the deltoid tuberosity with lateral and anterior displacement of the distal fragment as well as apex anterior angulation END OF IMPRESSION *Chest standard frontal and lateral viewsResult Date: 9/3/2025Increased pulmonary edema. Trace right and moderate to large left pleural fluid, increased compared to prior study, with associated atelectasis. Superimposed infection is not excluded. END OF IMPRESSIONASSESSMENT/PLAN: Shane Pearson is a 63 year old man with stage IV carcinoma, suspected renal cell in origin with intracranial and spinal metastases, ESRD on HD, CHF, chronic hypotension and GERD who  presented overnight from Urgent Care following a mechanical fall resulting in a right midshaft humerus fracture.- Appreciate orthopedic surgery's assistance: -Reduction of fracture performed by orthopedic surgery.  Plan for non-operative management -NWB in RUE; maintain extremity in sling at all times- Discontinue IV Dilaudid : Start oxycodone  5/10 mg every 4 hours as needed for pain- Nephrology consulted for provision of hemodialysis: - Nephro-Vite daily- Continue patient's home midodrine  10 mg 3 times daily and fludrocortisone  100 mcg 3 times daily for chronic orthostatic hypotension.- OT and PT consultsRemainder of care plan as noted in the PA's note above.  Plan of care discussed with oncology PA Shane Pearson, orthopedic surgery resident Dr. Lynda and bedside RN.Shane MYRTIS Reap, MD, MPHAttending Physician, Hospital [phone] [1] Past Medical History:Diagnosis Date  Arthritis   CHF (congestive heart failure)   Colon polyp   Dialysis patient   ESRD (end stage renal disease)   GERD (gastroesophageal reflux disease)   Hypercholesterolemia   Hypotension   Ischemic colitis   Thyroid  disease   hypo [2] Past Surgical History:Procedure Laterality Date  DIALYSIS FISTULA CREATION Bilateral   4 fistulas have failed- ? d/t low BP and small veins  HERNIA REPAIR    PR LIGATION/BIOPSY TEMPORAL ARTERY Right 10/04/2023  Procedure: TEMPORAL ARTERY BIOPSY RIGHT EYE;  Surgeon: Lynn Ervin Edelman, MD;  Location: [facility] MAIN OR;  Service: Ophthalmology  Right inguinal hernia repair   [3] AllergiesAllergen Reactions  Penicillins Swelling and Rash   Facial swellingMed History Tech discussed Penicillin allergy with patient on 10/18/23. Please see Antimicrobial Stewardship Chart Note for additional details.  Amoxicillin Swelling and Rash   Facial swellingMed History Tech discussed Amoxicillin allergy with patient on 10/18/23.  Please see Antimicrobial Stewardship Chart Note for additional details.  Baclofen Other (See Comments)   confusion  Coconut Nausea And Vomiting  No Known Latex Allergy  [4] Social HistorySocioeconomic History  Marital status: Married Occupational History  Occupation: Airline pilot Tobacco Use  Smoking status: Former   Packs/day: 0.30   Years: 15.00   Additional pack years: 0.00   Total pack years: 4.50   Types: Cigarettes   Quit date: 08/02/1992   Years since quitting: 31.3  Smokeless tobacco: Never Substance and Sexual Activity  Alcohol use: No  Drug use: No [5] Family HistoryProblem Relation Name Age of Onset  Diabetes Mother    Asthma  Mother    Coronary artery disease Father

## 2023-11-13 NOTE — ED Triage Notes (Addendum)
 Coming from UC. Sustained mechanical fall earlier today & heard a pop in the R shoulder. No head strike or LOC. C/o severe pain in the R shoulder/arm with decreased ROM. X-ray at [facility] showing spiral humerus fracture per note in chart. Arrives in sling. Arrives on 4L NC placed by EMS, SpO2 was 89% on RA (baseline). Denies SOB. Hx renal cell carcinoma, had radiation treatment today & dialysis treatment yesterday. EKG in triage.   Prehospital medications given: No

## 2023-11-13 NOTE — Patient Instructions (Addendum)
 RADIATION ONCOLOGY                INFORMATION FOR PATIENTS COMPLETING RADIATION THERAPYGreg Raisanen    11/13/2023    The following suggestions will help you manage any side effects and care for yourself after radiation therapy is completed. Side effects may persist or become worse over the next week and gradually resolve over the next few weeks.You will be receiving a post treatment questionnaire through MyChart. Please take a moment to complete and respond to the questions.1.Skin Care: If your skin is very dry or red, minimize the use of soap. You can return to you regular routine once the redness and dryness are gone. Let the radiation marks gradually wear off. Do not scrub. Once your skin reaction has completely healed, it is important to apply sunscreen (#30 or higher) to any treated skin that is exposed to the sun. This will prevent a sunburn.2. Diet:   Please continue to eat a well-balanced diet. Good nutrition will help the normal cells of your body to repair themselves from the effects of the radiation.3. Activity: If you feel tired, your energy level should gradually improve over the next several weeks. Plan to resume your normal lifestyle activities according to your energy level.4. Medications:  Decadron  2 mg twice daily , tylenol  as needed for throat discomfort , BMX before meals and bedtime as needed for sore throat 5. X-rays/Scans: per medical oncology 6. Blood Work:  per [employer] oncology 7. Follow-up Appointment: Televisit - Sept. 18th @ 11am with Delon bourgeois PA  - check in , discuss steroid wean

## 2023-11-13 NOTE — ED Provider Notes (Addendum)
 History Chief Complaint Patient presents with  Abnormal Imaging  Fall Coming from UC. Sustained mechanical fall earlier today & heard a pop in the R shoulder. No head strike or LOC. C/o severe pain in the R shoulder/arm with decreased ROM. X-ray at [facility] showing spiral humerus fracture per note in chart. Arrives in sling. Arrives on 4L NC placed by EMS, SpO2 was 89% on RA (baseline). Denies SOB. 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 He just finished his fifth of 5 days of radiation, 2000 cGy total, and was driven home by his wife from this.  He had climbed up 2 stairs, and at the end his walker was caught on a rug.  He reached down to try to lift the walker, and while lifting the walker, had the sudden onset of pain and he heard and felt a pop in his right shoulder.  EMS came, helped get him into a car, then [facility] to urgent care first. right shoulder pain radiating laterally to elbow, worse with motion, since then pain limits ability to move arm in any direction.  Did not take anything for this issue.  Right-hand-dominant.  No other known trauma to this shoulder.  He is unable to bear weight on the shoulder, or arm.  Still able to feel in arm.  Monitor for compartment syndrome, current neurovascularly intact right hand and RUE.Last dialysis September 2, he reports went well.  Notes describe mild hypotension.  Was given midodrine .At home does not like taking opioids because makes him feel tired and woozy, has taken Tylenol  only.  No prior known fracture before.  He has a long history of difficulty with IV access.  Denies fever/chills, recent illness, trouble breathing, urinary symptoms, N/V/D/C, headache, dizziness. Denies neck pain, chest pain, any  other new pains.  Left leg pain is not new.Has HD tunneled Cath. Uses walker at baseline short distances in house, wheelchair bound otherwise. With a R arm fracture, will be unable to ambulate entirely. Recent 3-day [facility] admission for concern of disease progression, weakness, discharged August 29.No AC. On asa 81. Hx ESRD on HD, RCC n active treatment. Had radiation earlier today, 400 cGr in this fraction. Allergies - rash/swelling to penicillin, amoxicillin. No MDR hx. Recent MRI Spine: Multiple enhancing metastases in many different vertebral bodies.Echo ordered, not yet done; last echo in 2012. Oncologic History7/19/25: Presented to emergency room with generalized malaise, pain, weight loss and failure to thrive.09/28/23 to 10/10/23: Admitted to hospital for workup.  Imaging studies consistent of CT shoulder, cervical spine, head, chest, abdomen and pelvis (without contrast); MRI cervical, thoracic and lumbar spine, and X-ray of bilateral femurs, which showed no acute radiographic finding in left shoulder and head.  There is moderate right and small left layering pleural effusion, multiple enlarged mediastinal lymph nodes, right chest wall subcutaneous soft tissue density nodules, a 2.6 x 1.7 cm right adrenal lesion with indeterminate attenuation, polycystic kidney disease, retroperitoneal gastrohepatic, and periportal lymphadenopathy, intramuscular lesions throughout the pelvis and paraspinal musculature, and osseous metastases in cervical, thoracic and lumbar spine.   IR biopsy of L5 lesion showed metastatic carcinoma of renal origin. Cytology of pleural fluid (dated 10/01/23) showed malignant tumor cells derived from carcinoma, supporting epithelial differentiation.  Bone scan showed diffuse background osseous uptake related to patient's renal osteodystrophy, uptake  within the proximal right humerus, and predominantly cortical uptake within the proximal bilateral femurs.  Brain MRI  showed numerous multifocal areas of punctate enhancement scattered throughout the brain parenchyma, consistent with intracranial metastatic disease. There was no significant mass effect and no evidence of associated hemorrhagic changes. 11/05/2023: 3-day admission for failure to thrive, worsening weakness and painHistory provided by:  Patient and medical recordsLanguage interpreter used: No  Medical/Surgical/Family History Past Medical History[1] Patient Active Problem List Diagnosis Code  ESRD (end stage renal disease) on dialysis N18.6, Z99.2  HLD (hyperlipidemia) E78.5  Hypotension I95.9  Ischemic colitis K55.9  Allergic rhinitis J30.9  Arteriosclerosis of artery of extremity I70.209  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  Embolism due to vascular prosthetic devices, implants and grafts, initial encounter T82.818A  Spondylosis of lumbosacral region without myelopathy or radiculopathy M47.817  Diarrhea R19.7  Failure to thrive in adult R62.7  History of temporal artery biopsy Z98.890  Leg weakness R29.898  Past Surgical History[2] Social History[3]  Review of Systems Constitutional:  Negative for activity change, appetite change, chills, diaphoresis, fatigue and fever. HENT:  Negative for sore throat.  Eyes:  Negative for photophobia, pain, redness and visual disturbance. Respiratory:  Negative for cough, chest tightness, shortness of breath, wheezing and stridor.  Cardiovascular:  Negative for chest pain, palpitations and leg swelling. Gastrointestinal:  Negative for abdominal pain, constipation, diarrhea, nausea and vomiting. Endocrine: Negative for cold intolerance and heat intolerance. Genitourinary:  Negative for dysuria, frequency and urgency. Musculoskeletal:  Positive for arthralgias. Negative for back pain, joint swelling, myalgias, neck pain and neck stiffness. Skin:  Negative for  pallor and rash. Allergic/Immunologic: Positive for immunocompromised state. Neurological:  Positive for weakness. Negative for dizziness, tremors, seizures, syncope, speech difficulty, light-headedness, numbness and headaches. Psychiatric/Behavioral:  Negative for confusion.  Physical Exam Triage VitalsTriage Start: Start, (11/13/23 1858)  First Recorded BP: 116/76, Resp: 18, Temp: 36.5 C (97.7 F) Oxygen Therapy SpO2: 94 %, O2 Device: O2 Therapy, O2 Therapy: Nasal cannula, O2 Flow Rate: 4 L/min (RA @baseline ), Heart Rate: 84, (11/13/23 1858)  .First Pain Reported 0-10  Pain Scale: 9, (11/13/23 1858) Physical ExamVitals and nursing note reviewed. Constitutional:     Appearance: Normal appearance. He is normal weight.    Comments: Frail, lying in bed HENT:    Head: Normocephalic and atraumatic.    Nose: Nose normal.    Mouth/Throat:    Mouth: Mucous membranes are moist.    Pharynx: Oropharynx is clear. Eyes:    General: No scleral icterus.      Right eye: No discharge.       Left eye: No discharge.    Extraocular Movements: Extraocular movements intact.    Conjunctiva/sclera: Conjunctivae normal. Cardiovascular:    Rate and Rhythm: Normal rate and regular rhythm.    Pulses: Normal pulses. Pulmonary:    Effort: Pulmonary effort is normal. No respiratory distress.    Breath sounds: No stridor. Rales present. No wheezing or rhonchi. Chest:    Chest wall: No tenderness. Abdominal:    General: Abdomen is flat. There is no distension.    Palpations: Abdomen is soft.    Tenderness: There is no abdominal tenderness. There is no guarding or rebound. Musculoskeletal:       General: Tenderness and signs of injury present.    Cervical back: No rigidity or tenderness.    Right lower leg: No edema.    Left lower leg: No edema.    Comments: R hand wwp, neurovascularly intactR shoulder  ROM entirely limited by pain, R arm ttpAble to  squeeze fingers but hurts when he does.  Skin:   General: Skin is warm and dry.    Coloration: Skin is not jaundiced.    Findings: No erythema or rash. Neurological:    General: No focal deficit present.    Mental Status: He is alert and oriented to person, place, and time. Mental status is at baseline.    Sensory: No sensory deficit.    Motor: No weakness. Psychiatric:       Mood and Affect: Mood normal.       Behavior: Behavior normal. Medical Decision Making Patient seen by me on:  9/3/2025Assessment:  12M w/ PMHx stage IV carcinoma (likely renal cell) with intracranial and spinal mets, ESRD on dialysis TTS schedule, CHF, HLD, hypotension on midodrine  and Florinef . Presents s/p GLF w/ a spiral humeral shaft fracture. We will acquire further imaging, acquire labs, manage pain, keep arm in sling, and consult Orthopedic surgery. Uses walker at baseline short distances in house, wheelchair bound otherwise. With a R arm fracture, will be unable to ambulate entirely, thus will need HLOC in the meantime likely. Differential diagnosis:  Shaft fracture, neck fracture, hip fracture, shoulder dislocation, OA, muscle strain, ligament sprain, rotator cuff pathology, septic arthritis, infectious source, metabolic abnormality, other.Plan:  Ortho consultKeep arm in sling Acquired IV access w/ nurse Celina Leung's great helpEKG - SR PVCs motion artifactCbc bmp ntprobnp mag pending; T/SViral swab negativeCXR - significant opacities mostly on L but iso recent radiation, and b/l effusionR humerus XR R shldr XR - Mildly displaced proximal humeral shaft fracture. R pleural effusion noted. Pain control-patient prefers to avoid opioids but is willing to try as pain is severe.  I will write as as needed Dilaudid  IV.  Already given Tylenol  at [facility].Monitor for compartment syndrome, currently neurovascularly intact right hand and RUE.EKG Interpretation:  Tracing reviewed  by myself, normal sinus rhythm, no ischemic changesIndependent interpretation of imaging: imaging reviewed.ED Course and Disposition:  Dispo [facility] to [facility] ortho consult.  SPRINGER,CHARLES, MDResident Attestation:Patient seen by me on 11/13/2023.I saw and evaluated the patient. I agree with the resident's/fellow's findings and plan of care as documented above.Additional attestation comments:  Review of prior external notes, 11/08/23 Oncology discharge summary, shows:Past history as aboveOrder tests: as aboveReview of test results shows K 5.3, Creatinine 6.66, Hematocrit 36I independently interpreted ECG showing NSR, QTc , with no acute ischemic changesI independently interpreted imaging, x-ray right humerus showing spiral fracture between middle and proximal 1/3 of shaftDiscussed management with external physician/other qualified health care professional, Orthopedic Surgery and OncologyDecision regarding admission for further management of the following:Right humerus spiral fractureFallAuthor:  SHERRA GORMAN ANO, MD Omar Dunnings, MDResident09/03/25 2250 [1] Past Medical History:Diagnosis Date  Arthritis   CHF (congestive heart failure)   Colon polyp   Dialysis patient   ESRD (end stage renal disease)   GERD (gastroesophageal reflux disease)   Hypercholesterolemia   Hypotension   Ischemic colitis   Thyroid  disease   hypo [2] Past Surgical History:Procedure Laterality Date  DIALYSIS FISTULA CREATION Bilateral   4 fistulas have failed- ? d/t low BP and small veins  HERNIA REPAIR    PR LIGATION/BIOPSY TEMPORAL ARTERY Right 10/04/2023  Procedure: TEMPORAL ARTERY BIOPSY RIGHT EYE;  Surgeon: Lynn Ervin Edelman, MD;  Location[facility] MAIN OR;  Service: Ophthalmology  Right inguinal hernia repair   [3] Social HistoryTobacco Use  Smoking status: Former   Packs/day: 0.30    Years: 15.00   Additional  pack years: 0.00   Total pack years: 4.50   Types: Cigarettes   Quit date: 08/02/1992   Years since quitting: 31.3  Smokeless tobacco: Never Substance Use Topics  Alcohol use: No  Drug use: No  Alysia Sherra RAMAN, MD09/15/25 [phone]

## 2023-11-13 NOTE — UC Provider Note (Signed)
 Chief Complaint: Shoulder Injury (Per patient heard a pop to right arm/shoulder noted today. Per patient S/S r/t tripping over a rug.)Interpreter: noneHistory given by: patient and his spouse HPI:63 y.o. male w hx as noted in chart presents to Urgent Care with Shoulder Injury (Per patient heard a pop to right arm/shoulder noted today. Per patient S/S r/t tripping over a rug.)History of Present IllnessThe patient, a 63 year old male with a history of shoulder pathology, presents with right shoulder pain. He is accompanied by his spouse.Right Shoulder Pain- The patient reports that he was ascending stairs using a walker when he tripped at the top and caught himself with the walker using his arm.- During this action, he heard an audible pop.- The pain is localized to the lateral aspect of his shoulder, radiates down to his elbow, and is associated with muscle spasms extending up his arm.- He is unable to lift his arm rotating his shoulder or movement forward due to the shoulder pain.- He has not taken any acetaminophen  for the pain but requested some during the visit.- He has a history of shoulder issues but no prior injuries to this shoulder.- He is right-handed.His last dialysis treatment was on 11/12/2023. He notes that his blood pressure tends to be slightly low, typically in the 90s mmHg, but he does not experience any dizziness.  Has midodrine  prescribedVITALS:BP (!) 88/76 Comment: manual  Pulse 88   Temp 36.4 C (97.5 F) (Temporal)   Resp 17   SpO2 100%  ROS: Per HPI as abovePhysical ExamVitals and nursing note reviewed. Constitutional:     Appearance: Normal appearance. He is normal weight. He is ill-appearing (chronic).    Comments: Patient is uncomfortable appearing sitting in wheelchair wincing in pain HENT:    Head: Normocephalic.    Nose: Nose normal.    Mouth/Throat:    Mouth: Mucous membranes are moist. Eyes:    Pupils:  Pupils are equal, round, and reactive to light. Pulmonary:    Effort: Pulmonary effort is normal. Musculoskeletal:    Right shoulder: Tenderness and bony tenderness (Humeral head and neck) present. No swelling, effusion, laceration or crepitus. Decreased range of motion (Limited with abduction and forward flexion). Decreased strength. Normal pulse.    Left shoulder: Normal.    Right elbow: Normal.    Right forearm: Normal.    Right wrist: Normal. Skin:   General: Skin is warm and dry.    Capillary Refill: Capillary refill takes less than 2 seconds. Neurological:    General: No focal deficit present.    Mental Status: He is alert and oriented to person, place, and time. Psychiatric:       Mood and Affect: Mood normal.       Behavior: Behavior normal.   ICD-10-CM ICD-9-CM  1. Closed fracture of head of right humerus, initial encounter  S42.291A 812.09 Shoulder RIGHT 3 or more views    acetaminophen  (TYLENOL ) tablet 650 mg    Nursing Provide Supply - Shoulder Immobilizer    Shoulder immobilizer  Assessment & Plan- Initial Assessment:  - 63 year old male presents with right shoulder pain after an injury when he was reaching for his walker when he tripped at the top step and heard an audible pop in his shoulder.  Since then has been having significant pain with poor range of motion- Differential Diagnosis:  - Humeral head fracture, humeral neck fracture, dislocation, rotator cuff tear, tendon tear- UC course:  - X-ray of right shoulder performed and reviewed by me which reveal a spiral  humerus fracture  - Tylenol  administered for pain relief- shoulder immobilizer - Disposition:  - Recommended transfer to high-level care for further evaluation and pain control.  Patient is frail, safety concerns as he will be unable to use his walker.  911 called for EMS transfer. Patient wishes to go to [facility] ED, spouse at bedside Lauraine Schooling, [facility]  Urgent Care

## 2023-11-13 NOTE — Patient Instructions (Signed)
 PROCEED DIRECTLY TO THE EMERGENCY DEPARTMENT.     Do not stop for food/ beverages along the way.

## 2023-11-14 ENCOUNTER — Ambulatory Visit: Admitting: Oncology

## 2023-11-14 ENCOUNTER — Inpatient Hospital Stay

## 2023-11-14 ENCOUNTER — Encounter

## 2023-11-14 DIAGNOSIS — M19011 Primary osteoarthritis, right shoulder: Secondary | ICD-10-CM

## 2023-11-14 DIAGNOSIS — I129 Hypertensive chronic kidney disease with stage 1 through stage 4 chronic kidney disease, or unspecified chronic kidney disease: Secondary | ICD-10-CM

## 2023-11-14 DIAGNOSIS — E875 Hyperkalemia: Secondary | ICD-10-CM

## 2023-11-14 DIAGNOSIS — M899 Disorder of bone, unspecified: Secondary | ICD-10-CM

## 2023-11-14 DIAGNOSIS — M19012 Primary osteoarthritis, left shoulder: Secondary | ICD-10-CM

## 2023-11-14 LAB — HOLD SST

## 2023-11-14 LAB — TYPE AND SCREEN
ABO RH Blood Type: A POS
Antibody Screen: NEGATIVE

## 2023-11-14 LAB — HEPATIC FUNCTION PANEL
ALT: 12 U/L (ref 0–50)
ALT: 9 U/L (ref 0–50)
AST: 40 U/L (ref 0–50)
Albumin: 3.3 g/dL — ABNORMAL LOW (ref 3.5–5.2)
Albumin: 3.2 g/dL — ABNORMAL LOW (ref 3.5–5.2)
Alk Phos: 278 U/L — ABNORMAL HIGH (ref 40–130)
Alk Phos: 279 U/L — ABNORMAL HIGH (ref 40–130)
Bilirubin,Direct: <0.2 mg/dL (ref 0.0–0.3)
Bilirubin,Total: 0.3 mg/dL (ref 0.0–1.2)
Bilirubin,Total: 0.3 mg/dL (ref 0.0–1.2)
Total Protein: 6.2 g/dL — ABNORMAL LOW (ref 6.3–7.7)
Total Protein: 5.8 g/dL — ABNORMAL LOW (ref 6.3–7.7)

## 2023-11-14 LAB — BASIC METABOLIC PANEL
Anion Gap: 25 — ABNORMAL HIGH (ref 7–16)
CO2: 20 mmol/L (ref 20–28)
Calcium: 7.9 mg/dL — ABNORMAL LOW (ref 8.6–10.2)
Chloride: 87 mmol/L — ABNORMAL LOW (ref 96–108)
Creatinine: 7.53 mg/dL — ABNORMAL HIGH (ref 0.67–1.17)
Glucose: 82 mg/dL (ref 60–99)
Lab: 56 mg/dL — ABNORMAL HIGH (ref 6–20)
Potassium: 5.6 mmol/L — ABNORMAL HIGH (ref 3.3–5.1)
Sodium: 132 mmol/L — ABNORMAL LOW (ref 133–145)
eGFR BY CREAT: 7 — AB

## 2023-11-14 LAB — MAGNESIUM: Magnesium: 1.8 mg/dL (ref 1.6–2.5)

## 2023-11-14 LAB — PHOSPHORUS: Phosphorus: 5.6 mg/dL — ABNORMAL HIGH (ref 2.7–4.5)

## 2023-11-14 LAB — HOLD GREEN WITH GEL

## 2023-11-14 LAB — PROTIME-INR
INR: 1.2 — ABNORMAL HIGH (ref 0.9–1.1)
Protime: 13.3 s — ABNORMAL HIGH (ref 10.0–12.9)

## 2023-11-14 MED ORDER — ACETAMINOPHEN 500 MG PO TABS *I*
1000.0000 mg | ORAL_TABLET | Freq: Three times a day (TID) | ORAL | Status: AC
Start: 2023-11-14 — End: 2024-01-13
  Administered 2023-11-14 – 2023-11-22 (×23): 1000 mg via ORAL
  Filled 2023-11-14 (×23): qty 2

## 2023-11-14 MED ORDER — FLUDROCORTISONE ACETATE 100 MCG PO TABS *I*
100.0000 ug | ORAL_TABLET | Freq: Three times a day (TID) | ORAL | Status: DC
Start: 2023-11-14 — End: 2024-01-12
  Administered 2023-11-14 – 2023-11-21 (×25): 100 ug via ORAL
  Filled 2023-11-14 (×32): qty 1

## 2023-11-14 MED ORDER — MIDODRINE HCL 5 MG PO TABS *I*
10.0000 mg | ORAL_TABLET | Freq: Three times a day (TID) | ORAL | Status: AC
Start: 2023-11-14 — End: 2024-01-12
  Administered 2023-11-14 – 2023-11-21 (×25): 10 mg via ORAL
  Filled 2023-11-14 (×28): qty 2

## 2023-11-14 MED ORDER — PANTOPRAZOLE SODIUM 40 MG PO TBEC *I*
40.0000 mg | DELAYED_RELEASE_TABLET | Freq: Every morning | ORAL | Status: DC
Start: 2023-11-14 — End: 2024-01-13
  Administered 2023-11-14 – 2023-11-21 (×8): 40 mg via ORAL
  Filled 2023-11-14 (×8): qty 1

## 2023-11-14 MED ORDER — DEXTROSE 5 % FLUSH FOR PUMPS *I*
0.0000 mL/h | INTRAVENOUS | Status: DC | PRN
Start: 2023-11-14 — End: 2024-01-13

## 2023-11-14 MED ORDER — SENNOSIDES 8.6 MG PO TABS *I*
1.0000 | ORAL_TABLET | Freq: Every evening | ORAL | Status: AC
Start: 2023-11-14 — End: 2024-01-13
  Administered 2023-11-14 – 2023-11-16 (×3): 1 via ORAL
  Filled 2023-11-14 (×3): qty 1

## 2023-11-14 MED ORDER — SODIUM CHLORIDE 0.9 % FLUSH FOR PUMPS *I*
0.0000 mL/h | INTRAVENOUS | Status: DC | PRN
Start: 2023-11-14 — End: 2024-01-13

## 2023-11-14 MED ORDER — ATORVASTATIN CALCIUM 10 MG PO TABS *I*
20.0000 mg | ORAL_TABLET | Freq: Every day | ORAL | Status: DC
Start: 2023-11-14 — End: 2024-01-13
  Administered 2023-11-14 – 2023-11-21 (×8): 20 mg via ORAL
  Filled 2023-11-14 (×4): qty 2
  Filled 2023-11-14: qty 1
  Filled 2023-11-14: qty 2
  Filled 2023-11-14: qty 1
  Filled 2023-11-14: qty 2
  Filled 2023-11-14: qty 1
  Filled 2023-11-14: qty 2

## 2023-11-14 MED ORDER — HEPARIN SODIUM (PORCINE) 1000 UNIT/ML IJ SOLN *WRAPPED*
0.0000 mL | Status: DC | PRN
Start: 2023-11-14 — End: 2023-11-26
  Administered 2023-11-14: 1600 [IU]
  Administered 2023-11-16: 1800 [IU]
  Administered 2023-11-19 – 2023-11-24 (×2): 1600 [IU]
  Administered 2023-11-25: 1000 [IU]
  Filled 2023-11-14 (×2): qty 10

## 2023-11-14 MED ORDER — MELATONIN 3 MG PO TABS *I*
3.0000 mg | ORAL_TABLET | Freq: Every evening | ORAL | Status: DC | PRN
Start: 2023-11-14 — End: 2023-11-26

## 2023-11-14 MED ORDER — POLYETHYLENE GLYCOL 3350 PO PACK 17 GM *I*
17.0000 g | PACK | Freq: Every day | ORAL | Status: DC | PRN
Start: 2023-11-14 — End: 2024-01-13

## 2023-11-14 MED ORDER — DIPHENHYDRAMINE-LIDOCAINE-MAALOX (BMX/FIRST MOUTHWASH) *WRAPPED*
15.0000 mL | ORAL | Status: DC | PRN
Start: 2023-11-14 — End: 2023-11-26
  Filled 2023-11-14: qty 15

## 2023-11-14 MED ORDER — FLUTICASONE PROPIONATE 50 MCG/ACT NA SUSP *I*
1.0000 | Freq: Every day | NASAL | Status: DC
Start: 2023-11-14 — End: 2024-01-13
  Administered 2023-11-14 – 2023-11-21 (×8): 1 via NASAL
  Filled 2023-11-14 (×2): qty 16

## 2023-11-14 MED ORDER — CINACALCET HCL 60 MG PO TABS *I*
60.0000 mg | ORAL_TABLET | Freq: Every evening | ORAL | Status: AC
Start: 2023-11-14 — End: 2024-01-13
  Administered 2023-11-14 – 2023-11-21 (×8): 60 mg via ORAL
  Filled 2023-11-14 (×10): qty 1

## 2023-11-14 MED ORDER — NEPHRO-VITE 0.8 MG PO TABS *I*
1.0000 | ORAL_TABLET | Freq: Every day | ORAL | Status: AC
Start: 2023-11-14 — End: 2024-01-13
  Administered 2023-11-14 – 2023-11-21 (×8): 1 via ORAL
  Filled 2023-11-14 (×10): qty 1

## 2023-11-14 MED ORDER — MAGNESIUM-OXIDE 400 (241.3 MG) MG PO TABS *WRAPPED*
400.0000 mg | ORAL_TABLET | Freq: Every day | ORAL | Status: AC
Start: 2023-11-14 — End: 2024-01-13
  Administered 2023-11-14 – 2023-11-21 (×8): 400 mg via ORAL
  Filled 2023-11-14 (×8): qty 1

## 2023-11-14 MED ORDER — DEXAMETHASONE 4 MG PO TABS *I*
2.0000 mg | ORAL_TABLET | Freq: Two times a day (BID) | ORAL | Status: AC
Start: 2023-11-14 — End: 2024-01-13
  Administered 2023-11-14 – 2023-11-20 (×12): 2 mg via ORAL
  Filled 2023-11-14 (×15): qty 1

## 2023-11-14 MED ORDER — HEPARIN SODIUM (PORCINE) 1000 UNIT/ML IJ SOLN *WRAPPED*
0.0000 mL | Status: DC | PRN
Start: 2023-11-14 — End: 2023-11-26
  Administered 2023-11-14: 1600 [IU]
  Administered 2023-11-16: 1800 [IU]
  Administered 2023-11-19: 1600 [IU]
  Administered 2023-11-19: 1800 [IU]
  Administered 2023-11-22: 1.6 mL
  Administered 2023-11-25: 1000 [IU]
  Filled 2023-11-14 (×2): qty 10
  Filled 2023-11-14: qty 4
  Filled 2023-11-14 (×2): qty 10

## 2023-11-14 MED ORDER — OXYCODONE HCL 5 MG PO TABS *I*
5.0000 mg | ORAL_TABLET | ORAL | Status: DC | PRN
Start: 2023-11-14 — End: 2023-11-17

## 2023-11-14 MED ORDER — ACETAMINOPHEN 500 MG PO TABS *I*
500.0000 mg | ORAL_TABLET | Freq: Three times a day (TID) | ORAL | Status: DC
Start: 2023-11-14 — End: 2023-11-14

## 2023-11-14 MED ORDER — IOHEXOL 350 MG/ML (OMNIPAQUE) IV SOLN 500ML BOTTLE *I*
1.0000 mL | Freq: Once | INTRAVENOUS | Status: AC
Start: 2023-11-14 — End: 2023-11-14
  Administered 2023-11-14: 116 mL via INTRAVENOUS

## 2023-11-14 MED ORDER — OXYCODONE HCL 10 MG PO TABS *I*
10.0000 mg | ORAL_TABLET | ORAL | Status: DC | PRN
Start: 2023-11-14 — End: 2023-11-17
  Administered 2023-11-15 – 2023-11-17 (×4): 10 mg via ORAL
  Filled 2023-11-14 (×3): qty 1

## 2023-11-14 MED ORDER — ONDANSETRON HCL 2 MG/ML IV SOLN *I*
4.0000 mg | Freq: Four times a day (QID) | INTRAMUSCULAR | Status: DC | PRN
Start: 2023-11-14 — End: 2023-11-14

## 2023-11-14 NOTE — Progress Notes (Signed)
 Social Work Progress Scientific laboratory technician for [employer] Social Worker, Raphael Fuelling, LMSW. Patient receives HD at [facility] in the community. They need d/c summary, AVS, med list, and any new HD orders from this admission faxed to them at discharge.Where: [facility] [place] [phone] When: TTS  Time 07:25 Transport: wife [facility] [facility], [phone], [phone] (fax) Plan:  Social work will continue to follow the patient and provide assistance as necessary.Chiquita Shake, [id number] Inpatient Social Worker

## 2023-11-14 NOTE — Progress Notes (Signed)
 Patient seen on dialysis.Blood pressure 102/59, pulse 80, temperature 36 C (96.8 F), resp. rate 20, height 1.753 m (5' 9), weight 77.6 kg (171 lb), SpO2 98%.Lab Results Component Value Date  K 5.6 (H) 11/14/2023 Dialyzing via HDTC for 4 hours against a 2 K bath targeting 2.5 L U.F.Dialysis is stable.CHARLIE CATER, MD

## 2023-11-14 NOTE — H&P (View-Only) (Signed)
 [facility] [place] CONSULT NOTE FOR 9/4/2025Patient:Shane LytleMRN: [id number] DOA: 9/3/2025Reason for consult: R arm painHPI: Shane Pearson is a 63 y.o. male with PMH of ESRD on dialysis (T/T/S), stage IV renal cell carcinoma with intracranial and spinal mets,  who presents to the ED with r proximal humerus fx sustained after mGLF.  His walker was caught on a rug and he reached down to try to lift the walker and ended up hitting his right shoulder on the walker/washer. He heard a pop come from his right shoulder and had pain with decreased ROM. EMS brought him to urgent care. XR at [facility] showed a spiral humerus fracture. He says if he stays still he does not have pain. Any movement of the right arm causes significant pain  Denies hitting head, LOC, syncope, chest pain, shortness of breath, fever, chills, nausea, or vomiting.  . ROS: Complete review of systems was done, and pertinent positives/negatives are listed in HPI.Shane Pearson:Shane Pearson Active Problem List Diagnosis Code  ESRD (end stage renal disease) on dialysis N18.6, Z99.2  HLD (hyperlipidemia) E78.5  Hypotension I95.9  Ischemic colitis K55.9  Allergic rhinitis J30.9  Arteriosclerosis of artery of extremity I70.209  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  Embolism due to vascular prosthetic devices, implants and grafts, initial encounter T82.818A  Spondylosis of lumbosacral region without myelopathy or radiculopathy M47.817  Diarrhea R19.7  Failure to thrive in adult R62.7  History of temporal artery biopsy Z98.890  Leg weakness R29.898  Other closed displaced fracture of proximal end of right humerus, initial encounter S42.291A Past Medical History[1]PSH:Past Surgical History[2]Allergies:Allergies[3]SH: Social History[4]FH: Family History[5]Objective:Temp:  [35.5 C (95.9 F)-36.7 C (98.1 F)] 35.5 C (95.9 F)Heart Rate:  [80-88]  80Resp:  [15-20] 20BP: (85-126)/(45-76) 126/45Exam:General: Alert, no acute distressHeart: Regular rateLungs: No increased work of breathing or accessory muscle useMusculoskeletal:RUE: Skin intact. There is obvious deformity about the extremity  Active elbow flex/ ex diminishedPassive elbow flex / ex diminishedMuscles of shoulder, elbow, wrist, hand fire voluntarilyThere is tenderness to palpation of the shoulderSensation of shoulder, elbow, wrist, hand intact to light touchFingers warm with normal color and a brisk capillary refill throughout, radial pulse palpableAIN, PIN, and Ulnar nerve intact Labs:Recent Labs Lab 09/03/251959 08/29/250000 WBC 10.9 12.2* Hemoglobin 10.4* 10.0* Hematocrit 36* 35* Platelets 337 229 Recent Labs Lab 09/04/250625 09/03/251959 Sodium 132* 135 Potassium 5.6* 5.3* UN 56* 48* Creatinine 7.53* 6.66* Recent Labs Lab 09/03/252134 INR 1.2* Protime 13.3* No results for input(s): ESR, CRP in the last 168 hours.Imaging: XR: Fracture of the proximal humeral shaft at the level of the deltoid tuberosity with lateral and anterior displacement of the distal fragment as well as apex anterior angulation CT: Slightly comminuted displaced and foreshortened oblique fracture through the proximal humeral diaphysis with posterior displacemen Procedure: Sling appliedAssessment and Plan:  Shane Pearson is a 63 y.o. male with a R prox hum fx..Procedure as described abovePost-reduction films show acceptable reduction.WB status: NWB LUEPain Control, Elevate/ice injured extremityKeep splint/cast clean and dry, do not remove. Please provide instructions in discharge on splint/cast care. Ortho trauma to follow for possible extensive workup of other lesionsJoshua Pearson, MDOrthopaedic SurgeryI saw and evaluated the patient on 11/16/2023. I agree with the resident's/fellow's findings  and plan of care as documented. Details of my evaluation are as follows: Plan for multiple trips to OR.  Will start w/ Right prophylactic CMN today. Will need L CMN and plans for Monday embolization and Tuesday IMN of Right humerus with Dr. Debera.Shane Pierini, MD 11/16/2023 1:18 PM Department  of Orthopaedic Surgery [1] Past Medical History:Diagnosis Date  Arthritis   CHF (congestive heart failure)   Colon polyp   Dialysis patient   ESRD (end stage renal disease)   GERD (gastroesophageal reflux disease)   Hypercholesterolemia   Hypotension   Ischemic colitis   Thyroid  disease   hypo [2] Past Surgical History:Procedure Laterality Date  DIALYSIS FISTULA CREATION Bilateral   4 fistulas have failed- ? d/t low BP and small veins  HERNIA REPAIR    PR LIGATION/BIOPSY TEMPORAL ARTERY Right 10/04/2023  Procedure: TEMPORAL ARTERY BIOPSY RIGHT EYE;  Surgeon: Lynn Ervin Edelman, MD;  Location: [facility] MAIN OR;  Service: Ophthalmology  Right inguinal hernia repair   [3] AllergiesAllergen Reactions  Penicillins Swelling and Rash   Facial swellingMed History Tech discussed Penicillin allergy with patient on 10/18/23. Please see Antimicrobial Stewardship Chart Note for additional details.  Amoxicillin Swelling and Rash   Facial swellingMed History Tech discussed Amoxicillin allergy with patient on 10/18/23. Please see Antimicrobial Stewardship Chart Note for additional details.  Baclofen Other (See Comments)   confusion  Coconut Nausea And Vomiting  No Known Latex Allergy  [4] Social HistorySocioeconomic History  Marital status: Married Occupational History  Occupation: Airline pilot Tobacco Use  Smoking status: Former   Packs/day: 0.30   Years: 15.00   Additional pack years: 0.00   Total pack years: 4.50   Types: Cigarettes   Quit date: 08/02/1992   Years since quitting: 31.3  Smokeless tobacco: Never  Substance and Sexual Activity  Alcohol use: No  Drug use: No [5] Family HistoryProblem Relation Name Age of Onset  Diabetes Mother    Asthma Mother    Coronary artery disease Father

## 2023-11-14 NOTE — Progress Notes (Signed)
 Report Given ToReport given to Megan, [facility] RN.Patient tolerated 4 hrs of HD Treatment. Removed Net Fluid Vol of 1.6 L. VS stable during and post HD. HD cath dressing changed. No complaints prior to HD discharge. Descriptive Sentence / Reason for Admission Duration of Treatment (minutes): 240Complications: High arterial pressure alarms, reversed lines with minimal effect. Catheter ran at 340-350 BFR throughout tx. Pt stated that he gets heparin  infusion during tx. Provider notified, no intradialytic heparin  d/t pt fall. 50ml NS boluses given every half hour to prevent clotting. With 40 min left of tx, pt started cramping and requested UF off. Provider notified and UF turned off for remainder of tx.Active Issues / Relevant Events Review of Medications administered such as routine meds, antibiotics, and heparin : Heparin  dwellsVital Signs: VSS, see flowsheet for details.To Do ListMedications still needing administration: None r/t dialysis.Dressing change due: 9/11/2025Anticipatory Guidance / Discharge PlanningDate/Time of Next Treatment: TBD by NephrologyPlease weigh pt. daily for accurate fluid removal and monitoring. Thank you!Fillmore Bynum, RN

## 2023-11-14 NOTE — Provider Consult (Shared)
 [facility] [place] SUR[facility]ERY CONSULT NOTE FOR 11/14/2023 Patient:Shane LytleMRN: [id number]/4/2025Reason for consult: right humerus fracture c/f pathologic fractureHPI: Shane Pearson is a  63 y.o. 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 The incident occurred yesterday after mechanical fall after his walker became caught on the rug. He reports he hit his right shoulder on the walker. Patient and wife presented to UC for evaluation where radiographs revealed a humeral shaft fracture. Currently endorsing pain to the RUE. Patient did not have antecedent R shoulder pain. Patient was seen in June by Dr. Karnyski at [facility] for L shoulder pain and radicular pain to the LUE.  Denies bony pain elsewhere. Denies prior injury or surgery to the extremity. Denies numbness or tingling to the extremity. Date and time of injury: 9/3Mechanism: ground level fallNPO since: ***Other injuries: ***EFY:[place]zwu Active Problem List Diagnosis Code  ESRD (end stage renal disease) on dialysis N18.6, Z99.2  HLD (hyperlipidemia) E78.5  Hypotension I95.9  Ischemic colitis K55.9  Allergic rhinitis J30.9  Arteriosclerosis of artery of extremity I70.209  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  Embolism due to vascular prosthetic devices, implants and grafts, initial encounter T82.818A  Spondylosis of lumbosacral region without myelopathy or radiculopathy M47.817  Diarrhea R19.7  Failure to thrive in adult R62.7  History of temporal artery biopsy Z98.890  Leg weakness R29.898  Other closed displaced fracture of proximal end of right humerus, initial encounter S42.291A  Past Medical History[1]PSH:Past Surgical History[2]Meds:Medications Ordered Prior  to Encounter[3]Scheduled Meds: atorvastatin   20 mg Oral Daily with dinner  B complex-vitamin C-folic acid   1 tablet Oral Daily  cinacalcet   60 mg Oral QPM  dexAMETHasone   2 mg Oral BID WC  fludrocortisone   100 mcg Oral TID  fluticasone   1 spray Nasal Daily  magnesium  oxide  400 mg Oral Daily  midodrine   10 mg Oral TID  pantoprazole   40 mg Oral QAM  senna  1 tablet Oral Nightly  acetaminophen   1,000 mg Oral TID Continuous Infusions:PRN Meds:.DiphenhydrAMINE -Lidocaine -Maalox (BMX/First Mouthwash), sodium chloride , dextrose , melatonin, polyethylene glycol, heparin  (porcine), heparin  (porcine), sodium chloride , dextrose , naloxone , HYDROmorphone  hcl **OR** HYDROmorphone  hclAllergies:Allergies[4]Social History:Occupation: accountant.Smoking: deniesDenies alcohol useDenies illicit drug use.[place]Family History:Noncontributory. Negative for bleeding, clotting disorder, problems with anesthesia.Family History[5]ROS:ROS x 11 reviewed. Denies CP/dyspnea, recent fevers/chills. Otherwise negative except for that presented in the above HPI.Physical Exam: Vitals: Vitals:  11/14/23 0736 BP: 98/55 Pulse: 84 Resp: 20 Temp: 35.6 C (96.1 F) Weight:  Height:  General:  Alert, no acute distress, supine in bed. HEENT: Normocephalic, atraumatic. CV: Regular rate, rhythmRespiratory: Regular respiratory rate.  No increased work of breathing or accessory muscle use. No audible wheezing. No cough.RUE: No gross deformities or TTP throughout extremity.Skin intact and without tenting or fracture blisters.  No swelling, ecchymosis or lacerations.  No TTP/crepitus at the clavicle/shoulder/arm/elbow/forearm/wrist/hand. Able to extend thumb, flex thumb IP, flex/extend digits/wrist, and abduct/adduct fingers.  SILT over deltoid, 1st DWS, volar index and small fingers.  2+ radial pulse, fingers WWP, < 3 s CR.LUE:  No  gross deformities or TTP throughout extremity.Skin intact and without tenting or fracture blisters.  No swelling, ecchymosis or lacerations.  No TTP/crepitus at the clavicle/shoulder/arm/elbow/forearm/wrist/hand. Able to extend thumb, flex thumb IP, flex/extend digits/wrist, and abduct/adduct fingers.  SILT over deltoid, 1st DWS, volar index and small fingers.  2+ radial pulse, fingers  WWP, < 3 s CR.Pelvis:  No TTP at symphysis, stable to AP/lateral compressionRLE: No gross deformities or TTP throughout extremity.LLE: No gross deformities or TTP throughout extremity.Labs:Recent Labs Lab 09/03/251959 08/29/250000 WBC 10.9 12.2* Hemoglobin 10.4* 10.0* Hematocrit 36* 35* Platelets 337 229 Recent Labs Lab 09/04/250625 09/03/251959 08/29/250000 Sodium 132* 135 140 Potassium 5.6* 5.3* 4.1 Chloride 87* 88* 98 CO2 20 22 23  UN 56* 48* 23* Creatinine 7.53* 6.66* 4.22* Glucose 82 92 129* Recent Labs Lab 09/03/252134 INR 1.2* Protime 13.3* No results for input(s): ESR, CRP in the last 168 hours.Imaging: Xray: R humerus: displaced fracture through the proximal humeral shaft CT: ***MRI: ***Assessment and Plan:63 y.o. male with PMH of stage IV Procedure as described abovePost-reduction films show acceptable reduction.WB status: ***Pain Control, Elevate/ice injured extremityKeep splint/cast clean and dry, do not remove. Please provide instructions in discharge on splint/cast care. Tetanus update if needed***Antibiotics: Keflex; if penicillin allergic, please give clindamycin.***Follow-up with Dr. PIERRETTE in 3-5 days, call [phone] to make an appointmentPlan d/w Dr. Rozell Quitter, PAOrthopaedic Surgery 11/14/2023, 10:39 AMAssessment and Plan:63 y.o. male with a ***WSOR for ***Post-reduction films show acceptable reduction.***Open fracture: IV antibioticsAncef/Gentamycin vs Clinda/Vanc.  Penicillin if grossly contaminated/dirt. Tetanus update if not within 5 yearsWeight Bearing Status: Non-weight bearing ***Surgical fixation of fracture when medically optimized:NPO after midnight day before surgeryIV fluids while NPOFoley***CBC, Chem-7, PT/INR, type and screenMRSA ScreenUrine pregnancy test if applicable***EKGChest x-rayAntibiotics on call to OR - ******Trauma Surgery/Medicine consult***Metabolic bone work-up: 25-OH Vitamin D , intact PTH, TSH, BMP (Calcium ), Albumin, AM testosterone if Male***Due to the patient's likely fragility fracture We will consult our fracture liaison service to initiate secondary fracture prevention work-upDVT prophylaxis: IPCs if tolerated by injuries, hold chemoprophylaxis in the immediate pre-operative periodDisposition: ***Plan d/w Dr. PIERRETTE who is in agreement with the plan above. Philippe Quitter, PAOrthopaedic Surgery9/06/2023, 10:39 AM  [1] Past Medical History:Diagnosis Date  Arthritis   CHF (congestive heart failure)   Colon polyp   Dialysis patient   ESRD (end stage renal disease)   GERD (gastroesophageal reflux disease)   Hypercholesterolemia   Hypotension   Ischemic colitis   Thyroid  disease   hypo [2] Past Surgical History:Procedure Laterality Date  DIALYSIS FISTULA CREATION Bilateral   4 fistulas have failed- ? d/t low BP and small veins  HERNIA REPAIR    PR LIGATION/BIOPSY TEMPORAL ARTERY Right 10/04/2023  Procedure: TEMPORAL ARTERY BIOPSY RIGHT EYE;  Surgeon: Lynn Ervin Edelman, MD;  Location: [facility] MAIN OR;  Service: Ophthalmology  Right inguinal hernia repair   [3] No current facility-administered medications on file prior to encounter. Current Outpatient Medications on File Prior to Encounter Medication Sig Dispense Refill  dexAMETHasone  (DECADRON ) 2 MG tablet Take 1 tablet (2 mg total) by mouth 2 times daily (with meals) for cord compression. 60  tablet 0  acetaminophen  (TYLENOL ) 500 mg tablet Take 1 tablet (500 mg total) by mouth 3 times daily as needed for Pain for Pain. Take 2 tabs (1000mg ) by mouth three times a day as needed for pain. MDD 3000mg .    midodrine  (PROAMATINE ) 10 mg tablet Take 1 tablet (10 mg total) by mouth 3 times daily for Disorder of Low Blood Pressure.    pantoprazole  (PROTONIX ) 40 mg EC tablet Take 1 tablet (40 mg total) by mouth every morning. Swallow whole. Do not crush, break, or chew. 30 tablet 0  magnesium  oxide (MAGNESIUM  OXIDE -MG SUPPLEMENT) 400 (241.3 mg) mg tablet Take 1 tablet (400 mg total) by mouth daily. 90 tablet 3  aspirin  81 MG EC  tablet Take 1 tablet (81 mg total) by mouth daily for DVT prophylaxis.    fluticasone  (FLONASE ) 50 MCG/ACT nasal spray Spray 1 spray into nostril daily for allergies.  Spray 1 spray each nares    atorvastatin  (LIPITOR) 20 mg tablet Take 1 tablet (20 mg total) by mouth daily (with dinner) for High Amount of Fats in the Blood.    fludrocortisone  (FLORINEF ) 0.1 mg tablet Take 1 tablet (100 mcg total) by mouth 3 times daily for Blood Pressure Drop Upon Standing.    b complex-vitamin c-folic acid  (NEPHRO-VITE) tablet Take 1 tablet by mouth daily for Complex vitamin.    calcium -vitamin D  (OSCAL-500) 500-200 MG-UNIT per tablet Take 1 tablet by mouth 2 times daily for Vit D.    cinacalcet  (SENSIPAR ) 30 MG tablet Take 2 tablets (60 mg total) by mouth every evening for parathyroid.    Commode, adjustable 3-in-1 for Cancer that has Spread From One Place to Another. Use as directed 1 each 0  walker Exact equipment needed: Rolling walker Specific length of time needed: LifetimeICD10 Code: R26.2 Ht: 5' 5   Wt: 175 lbs 1 each 0 [4] AllergiesAllergen Reactions  Penicillins Swelling and Rash   Facial swellingMed History Tech discussed Penicillin allergy with patient on 10/18/23. Please see Antimicrobial Stewardship Chart Note for additional details.   Amoxicillin Swelling and Rash   Facial swellingMed History Tech discussed Amoxicillin allergy with patient on 10/18/23. Please see Antimicrobial Stewardship Chart Note for additional details.  Baclofen Other (See Comments)   confusion  Coconut Nausea And Vomiting  No Known Latex Allergy  [5] Family HistoryProblem Relation Name Age of Onset  Diabetes Mother    Asthma Mother    Coronary artery disease Father

## 2023-11-14 NOTE — H&P (View-Only) (Signed)
 Nephrology Consult Note9/06/2023 8:21 AMChief Complaint: FallReason for Consult:  Assistance and management of ESRD History of Present Illness:Shane Pearson is a 63 y.o. Male with ESRD on HD TTS at 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 Ortho being consulted. Of note, had recent hospitalization from 8/26-8/29 for weakness concerning for disease progression. CXR with pulmonary edema. Labs notable for Na 132, K 5.6, AG 25, Ca 7.9 and phosphorus 5.6. Afebrile with no leukocytosis. Nephrology consulted for ESRD and provision of dialysis. Seen at bedside with family. Shane notes that his walker got caught on the rug and then he fell on his shoulder. Denies any hitting his head or loc. Notes last dialysis session was on Tuesday with no issues.  Anuric at baseline. Denies any cp, sob, n/v/d. Pain is well controlled and right arm in sling. Plan for dialysis today discussed. All current medications, VS, and labs have been reviewed. Dr. Mat is his nephrologist. Review of Systems 10 point ROS is otherwise negative except those mentioned abovePast Medical History[1]Past Surgical History[2]Family History[3]Social History Socioeconomic History  Marital status: Married Tobacco Use  Smoking status: Former   Packs/day: 0.30   Years: 15.00   Additional pack years: 0.00   Total pack years: 4.50   Types: Cigarettes   Quit date: 08/02/1992   Years since quitting: 31.3  Smokeless tobacco: Never Substance and Sexual Activity  Alcohol use: No  Drug use: No Allergies:  Allergies[4]Prior to Admission Medications:Medications Prior to Admission Medication Sig  dexAMETHasone  (DECADRON ) 2 MG tablet Take 1 tablet (2 mg total) by mouth 2 times daily (with meals) for cord compression.  acetaminophen  (TYLENOL ) 500 mg tablet Take 1 tablet (500 mg total) by mouth 3 times daily as needed for Pain for Pain. Take 2 tabs (1000mg ) by mouth three times a day as needed for pain. MDD 3000mg .  midodrine  (PROAMATINE ) 10 mg tablet Take 1 tablet (10 mg total) by mouth 3 times daily for Disorder of Low Blood Pressure.  pantoprazole  (PROTONIX ) 40 mg EC tablet Take 1 tablet (40 mg total) by mouth every morning. Swallow whole. Do not crush, break, or chew.  magnesium  oxide (MAGNESIUM  OXIDE -MG SUPPLEMENT) 400 (241.3 mg) mg tablet Take 1 tablet (400 mg total) by mouth daily.  aspirin  81 MG EC tablet Take 1 tablet (81 mg total) by mouth daily for DVT prophylaxis.  fluticasone  (FLONASE ) 50 MCG/ACT nasal spray Spray 1 spray into nostril daily for allergies.  Spray 1 spray each nares  atorvastatin  (LIPITOR) 20 mg tablet Take 1 tablet (20 mg total) by mouth daily (with dinner) for High Amount of Fats in the Blood.  fludrocortisone  (FLORINEF ) 0.1 mg tablet Take 1 tablet (100 mcg total) by mouth 3 times daily for Blood Pressure Drop Upon Standing.  b complex-vitamin c-folic acid  (NEPHRO-VITE) tablet Take 1 tablet by mouth daily for Complex vitamin.  calcium -vitamin D  (OSCAL-500) 500-200 MG-UNIT per tablet Take 1 tablet by mouth 2 times daily for Vit D.  cinacalcet  (SENSIPAR ) 30 MG tablet Take 2 tablets (60 mg total) by mouth every evening for parathyroid.  Commode, adjustable 3-in-1 for Cancer that has Spread From One Place to Another. Use as directed  walker Exact equipment needed: Rolling walker Specific length of time needed: LifetimeICD10 Code: R26.2 Ht: 5' 5   Wt: 175 lbs Current MedsCurrent Facility-Administered Medications  Medication Dose Route Frequency   atorvastatin  (LIPITOR) tablet 20 mg  20 mg Oral Daily with dinner  B complex-vitamin C-folic acid  (NEPHRO-VITE) tablet 1 tablet  1 tablet Oral Daily  cinacalcet  (SENSIPAR ) tablet 60 mg  60 mg Oral QPM  dexAMETHasone  (DECADRON ) tablet 2 mg  2 mg Oral BID WC  fludrocortisone  (FLORINEF ) tablet 100 mcg  100 mcg Oral TID  fluticasone  (FLONASE ) 50 MCG/ACT nasal spray 1 spray  1 spray Nasal Daily  magnesium  oxide (MAG-OX) tablet 400 mg  400 mg Oral Daily  midodrine  (PROAMATINE ) tablet 10 mg  10 mg Oral TID  pantoprazole  (PROTONIX ) EC tablet 40 mg  40 mg Oral QAM  diphenydraMINE-lidocaine -maalox (BMX/First Mouthwash) compound suspension  15 mL Mouth/Throat Q4H PRN  sodium chloride  0.9 % FLUSH REQUIRED IF PATIENT HAS IV  0-500 mL/hr Intravenous PRN  dextrose  5 % FLUSH REQUIRED IF PATIENT HAS IV  0-500 mL/hr Intravenous PRN  melatonin tablet 3 mg  3 mg Oral QHS PRN  polyethylene glycol (GLYCOLAX ,MIRALAX ) powder 17 g  17 g Oral Daily PRN  senna (SENOKOT) tablet 1 tablet  1 tablet Oral Nightly  acetaminophen  (TYLENOL ) tablet 1,000 mg  1,000 mg Oral TID  sodium chloride  0.9 % FLUSH REQUIRED IF PATIENT HAS IV  0-500 mL/hr Intravenous PRN  dextrose  5 % FLUSH REQUIRED IF PATIENT HAS IV  0-500 mL/hr Intravenous PRN  naloxone  (NARCAN ) 0.4 mg/mL injection 0.08 mg  0.08 mg Intravenous PRN  HYDROmorphone  PF (DILAUDID ) injection 0.5 mg  0.5 mg Intravenous Q2H PRN  Or  HYDROmorphone  PF (DILAUDID ) injection 0.25 mg  0.25 mg Intravenous Q2H PRN Physical Examination:Vitals:  11/14/23 0037 11/14/23 0116 11/14/23 0600 11/14/23 0736 BP: (!) 85/55 95/52 122/53 98/55 BP Location: Left arm Left arm Right leg Right leg Pulse: 81 84 83 84 Resp: 15 16 20 20  Temp: 36.7 C (98.1 F) 35.8 C (96.4 F) 35.7 C (96.3 F) 35.6 C (96.1 F) TempSrc: Temporal Temporal Temporal Temporal SpO2: 92% 92% 93% 93% Weight:  77.6 kg (171 lb)   Height:     Intake/Output last 3 shifts:No  intake/output data recorded.Physical ExamGeneral: Awake, alert, NADHEENT: NC/ATCardiovascular: RRRPulmonary: diminished BS, normal WOB on room airExt: no BLE edema noted, R arm in slingNeurological: answers questions appropriately, no focal deficitsAccess: HDTC opaque dressing in place, c/d/iLab Results:Recent Labs Lab 09/04/250625 09/03/252134 09/03/251959 08/29/250000 Sodium 132*  --  135 140 Potassium 5.6*  --  5.3* 4.1 Chloride 87*  --  88* 98 CO2 20  --  22 23 UN 56*  --  48* 23* Creatinine 7.53*  --  6.66* 4.22* Glucose 82  --  92 129* Calcium  7.9*  --  7.8* 8.2* Albumin 3.2* 3.3*  --   --  Phosphorus 5.6*  --   --  3.3   Component Value Date/Time  PTH 309.0 (H) 10/17/2023 1644  Recent Labs Lab 09/03/251959 08/29/250000 WBC 10.9 12.2* Hemoglobin 10.4* 10.0* Hematocrit 36* 35* Platelets 337 229   Component Value Date/Time  FE 65 10/17/2023 1644  IBC 176 (L) 10/17/2023 1644  FESAT 37 10/17/2023 1644  FER 881 (H) 10/17/2023 1644  CT humerus RIGHT with contrastResult Date: 9/4/2025Slightly comminuted displaced and foreshortened oblique fracture through the proximal humeral diaphysis with posterior displacement. Associated adjacent soft tissue hematoma. Arthritic changes of the glenohumeral joint with subcortical cystic changes and possible erosions. Degenerative changes of the acromioclavicular joint prominent right axillary node measuring 1 cm, nonspecific, correlate clinically END OF IMPRESSION * Elbow RIGHT standard AP, Lateral, and both ObliquesResult Date:  9/4/2025No acute fracture or dislocation. END OF IMPRESSION I have personally reviewed the images and the Resident's/Fellow's interpretation and agree with or edited the findings. * Humerus RIGHT standard AP and  Lateral viewsResult Date: 9/3/2025Fracture of the proximal humeral shaft at the level of the deltoid tuberosity with lateral and  anterior displacement of the distal fragment as well as apex anterior angulation END OF IMPRESSION I have personally reviewed the images and the Resident's/Fellow's interpretation and agree with or edited the findings. *Chest standard frontal and lateral viewsResult Date: 9/3/2025Increased pulmonary edema. Trace right and moderate to large left pleural fluid, increased compared to prior study, with associated atelectasis. Superimposed infection is not excluded. END OF IMPRESSION I have personally reviewed the images and the Resident's/Fellow's interpretation and agree with or edited the findings. EKG 12 leadResult Date: 9/3/2025Sinus rhythm Ventricular premature complex Low voltage, extremity and precordial leads Prolonged QT interval Poor quality data, interpretation may be affectedShoulder RIGHT 3 or more viewsResult Date: 9/3/2025Mildly displaced proximal humeral shaft fracture. Diffuse osseous sclerosis. Central catheter terminates at the cavoatrial junction. Right-sided pleural effusion. Patchy airspace opacities in the right lower lung zone. Possible left-sided pleural effusion. END OF IMPRESSION I have personally reviewed the images and the Resident's/Fellow's interpretation and agree with or edited the findings.  Assessment/PlanGreg Pearson is a 63 y.o. 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 Ortho being consulted. Nephrology consulted for ESRD and provision of dialysis.Hemodialysis Note1. ESRD suspected to be 2/2 HTN on HD TTS at [facility] dialyze today with the following prescription.F180 Na 138 K 2 Ca 2.5 HCO3 35 Qb: 400 ml/min Qd: 1.5X Qb UF 1-2.5 L as hemodynamics tolerates Renally dose medications for CrCl < 10  mL/minContinue NephroviteEDW 75 kgFollow chemistriesElectrolytesHyponatremia likely 2/2 hypervolemia. Should improve with HD/UFHyperkalemia should correct with HDFollow chemistriesAcid-BaseWill keep within desired goal range (serum bicarb 22-25) with RRT against 35 bicarbAnemia: Goal hemoglobin of 10-11 gm/dl.Hgb 10.4No ESA indicated at this time, Defer with malignancyCKD-MBD: Hypocalcemia noted with hypoalbuminemia, corrected ok. MonitorRestart home calcitriol  0.75 mcg qTTSPhosphorus 5.6, should improve with HDContinue Sensipar  60 mg daily ( iPTH 759 on 10/24/23) Hemodynamics/ Orthostatic hypotensionSBP range: 80's - 110'sUF with HD as ableOn Midodrine  10 mg TID + florinefAccess: HDTCNext HD:  todayRemainder of management per primary team.Thank you for allowing us  to participate in the care of this patient.Please call with any questions. Attending recommendations to follow. Alan Contes, NPNephrology DivisionPager 93349/06/2023 8:21 AMNEPHROLOGY ATTENDING:Attending Addendum:I saw and evaluated the patient, and I provided the substantive portion the medical decision making. I approved the management plan. I take responsibility for the plan and the patient's management. The medical decision making was personally performed by me and any additions to our assessment and care plan are noted below:{We recommend the physician documents the problems addressed during the encounter. If using data category 2 or 3 (using independent interpretation of study or discussion with another provider) to support the level of service for the visit, it must be documented by the billing provider   I personally saw and evaluated Shane Pearson.  I performed a substantive portion of the visit. Please see note above for details.EVENTS:  Patient fractured arm with fall yesterday and is awaiting operative repair.  He is ESRD TTS  scheule.Gen:  Lying in bed, mild distress.Blood  pressure 102/59, pulse 80, temperature 36 C (96.8 F), resp. rate 20, height 1.753 m (5' 9), weight 77.6 kg (171 lb), SpO2 98%.Eyes:  Conj pale; gaze conjugate, no scleral icterusENT:  OP clear; MMMChest:  Clear with normal effort; HDTC in chest wallCardio-vascular:  Reg, no rub; extremities perfused; trace edemaAb:  Soft, nt, ndSkin:  No rashes or ulcers on visible skin. Neuro:  Alert, oriented, memory grossly intact; affect appropriateAssessment:  ESRD here with humerus fracture after fall.Plan:ESRD:  Will dialyze today per patient's usual prescription.Hypertension/Volume overload:  Controlled.Anemia:  hgb adequate.Hyperphosphatemia:  Mild.  Low phos diet.  Continue home binder regimen.Hyperkalemia:  Low K diet.  Dialysis today. Author: CHARLIE CATER, MD  as of: 11/14/2023  at: 2:51 PM    [1] Past Medical History:Diagnosis Date  Arthritis   CHF (congestive heart failure)   Colon polyp   Dialysis patient   ESRD (end stage renal disease)   GERD (gastroesophageal reflux disease)   Hypercholesterolemia   Hypotension   Ischemic colitis   Thyroid  disease   hypo [2] Past Surgical History:Procedure Laterality Date  DIALYSIS FISTULA CREATION Bilateral   4 fistulas have failed- ? d/t low BP and small veins  HERNIA REPAIR    PR LIGATION/BIOPSY TEMPORAL ARTERY Right 10/04/2023  Procedure: TEMPORAL ARTERY BIOPSY RIGHT EYE;  Surgeon: Lynn Ervin Edelman, MD;  Location[facility] MAIN OR;  Service: Ophthalmology  Right inguinal hernia repair   [3] Family HistoryProblem Relation Name Age of Onset  Diabetes Mother    Asthma Mother    Coronary artery disease Father   [4] AllergiesAllergen Reactions  Penicillins Swelling and Rash   Facial swellingMed History Tech discussed Penicillin allergy with  patient on 10/18/23. Please see Antimicrobial Stewardship Chart Note for additional details.  Amoxicillin Swelling and Rash   Facial swellingMed History Tech discussed Amoxicillin allergy with patient on 10/18/23. Please see Antimicrobial Stewardship Chart Note for additional details.  Baclofen Other (See Comments)   confusion  Coconut Nausea And Vomiting  No Known Latex Allergy

## 2023-11-14 NOTE — Provider Consult (Addendum)
 Nephrology Consult Note9/06/2023 8:21 AMChief Complaint: FallReason for Consult:  Assistance and management of ESRD History of Present Illness:Shane Pearson is a 63 y.o. Male with ESRD on HD TTS at 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 Ortho being consulted. Of note, had recent hospitalization from 8/26-8/29 for weakness concerning for disease progression. CXR with pulmonary edema. Labs notable for Na 132, K 5.6, AG 25, Ca 7.9 and phosphorus 5.6. Afebrile with no leukocytosis. Nephrology consulted for ESRD and provision of dialysis. Seen at bedside with family. Shane notes that his walker got caught on the rug and then he fell on his shoulder. Denies any hitting his head or loc. Notes last dialysis session was on Tuesday with no issues.  Anuric at baseline. Denies any cp, sob, n/v/d. Pain is well controlled and right arm in sling. Plan for dialysis today discussed. All current medications, VS, and labs have been reviewed. Dr. Mat is his nephrologist. Review of Systems 10 point ROS is otherwise negative except those mentioned abovePast Medical History[1]Past Surgical History[2]Family History[3]Social History Socioeconomic History  Marital status: Married Tobacco Use  Smoking status: Former   Packs/day: 0.30   Years: 15.00   Additional pack years: 0.00   Total pack years: 4.50   Types: Cigarettes   Quit date: 08/02/1992   Years since quitting: 31.3  Smokeless tobacco: Never Substance and Sexual Activity  Alcohol use: No  Drug use: No Allergies:  Allergies[4]Prior to Admission Medications:Medications Prior to Admission Medication Sig  dexAMETHasone  (DECADRON ) 2 MG tablet Take 1 tablet (2 mg total) by mouth 2 times daily (with meals) for cord compression.  acetaminophen  (TYLENOL ) 500 mg tablet Take 1 tablet (500 mg total) by mouth 3 times daily as needed for Pain for Pain. Take 2 tabs (1000mg ) by mouth three times a day as needed for pain. MDD 3000mg .  midodrine  (PROAMATINE ) 10 mg tablet Take 1 tablet (10 mg total) by mouth 3 times daily for Disorder of Low Blood Pressure.  pantoprazole  (PROTONIX ) 40 mg EC tablet Take 1 tablet (40 mg total) by mouth every morning. Swallow whole. Do not crush, break, or chew.  magnesium  oxide (MAGNESIUM  OXIDE -MG SUPPLEMENT) 400 (241.3 mg) mg tablet Take 1 tablet (400 mg total) by mouth daily.  aspirin  81 MG EC tablet Take 1 tablet (81 mg total) by mouth daily for DVT prophylaxis.  fluticasone  (FLONASE ) 50 MCG/ACT nasal spray Spray 1 spray into nostril daily for allergies.  Spray 1 spray each nares  atorvastatin  (LIPITOR) 20 mg tablet Take 1 tablet (20 mg total) by mouth daily (with dinner) for High Amount of Fats in the Blood.  fludrocortisone  (FLORINEF ) 0.1 mg tablet Take 1 tablet (100 mcg total) by mouth 3 times daily for Blood Pressure Drop Upon Standing.  b complex-vitamin c-folic acid  (NEPHRO-VITE) tablet Take 1 tablet by mouth daily for Complex vitamin.  calcium -vitamin D  (OSCAL-500) 500-200 MG-UNIT per tablet Take 1 tablet by mouth 2 times daily for Vit D.  cinacalcet  (SENSIPAR ) 30 MG tablet Take 2 tablets (60 mg total) by mouth every evening for parathyroid.  Commode, adjustable 3-in-1 for Cancer that has Spread From One Place to Another. Use as directed  walker Exact equipment needed: Rolling walker Specific length of time needed: LifetimeICD10 Code: R26.2 Ht: 5' 5   Wt: 175 lbs Current MedsCurrent Facility-Administered Medications  Medication Dose Route Frequency   atorvastatin  (LIPITOR) tablet 20 mg  20 mg Oral Daily with dinner  B complex-vitamin C-folic acid  (NEPHRO-VITE) tablet 1 tablet  1 tablet Oral Daily  cinacalcet  (SENSIPAR ) tablet 60 mg  60 mg Oral QPM  dexAMETHasone  (DECADRON ) tablet 2 mg  2 mg Oral BID WC  fludrocortisone  (FLORINEF ) tablet 100 mcg  100 mcg Oral TID  fluticasone  (FLONASE ) 50 MCG/ACT nasal spray 1 spray  1 spray Nasal Daily  magnesium  oxide (MAG-OX) tablet 400 mg  400 mg Oral Daily  midodrine  (PROAMATINE ) tablet 10 mg  10 mg Oral TID  pantoprazole  (PROTONIX ) EC tablet 40 mg  40 mg Oral QAM  diphenydraMINE-lidocaine -maalox (BMX/First Mouthwash) compound suspension  15 mL Mouth/Throat Q4H PRN  sodium chloride  0.9 % FLUSH REQUIRED IF PATIENT HAS IV  0-500 mL/hr Intravenous PRN  dextrose  5 % FLUSH REQUIRED IF PATIENT HAS IV  0-500 mL/hr Intravenous PRN  melatonin tablet 3 mg  3 mg Oral QHS PRN  polyethylene glycol (GLYCOLAX ,MIRALAX ) powder 17 g  17 g Oral Daily PRN  senna (SENOKOT) tablet 1 tablet  1 tablet Oral Nightly  acetaminophen  (TYLENOL ) tablet 1,000 mg  1,000 mg Oral TID  sodium chloride  0.9 % FLUSH REQUIRED IF PATIENT HAS IV  0-500 mL/hr Intravenous PRN  dextrose  5 % FLUSH REQUIRED IF PATIENT HAS IV  0-500 mL/hr Intravenous PRN  naloxone  (NARCAN ) 0.4 mg/mL injection 0.08 mg  0.08 mg Intravenous PRN  HYDROmorphone  PF (DILAUDID ) injection 0.5 mg  0.5 mg Intravenous Q2H PRN  Or  HYDROmorphone  PF (DILAUDID ) injection 0.25 mg  0.25 mg Intravenous Q2H PRN Physical Examination:Vitals:  11/14/23 0037 11/14/23 0116 11/14/23 0600 11/14/23 0736 BP: (!) 85/55 95/52 122/53 98/55 BP Location: Left arm Left arm Right leg Right leg Pulse: 81 84 83 84 Resp: 15 16 20 20  Temp: 36.7 C (98.1 F) 35.8 C (96.4 F) 35.7 C (96.3 F) 35.6 C (96.1 F) TempSrc: Temporal Temporal Temporal Temporal SpO2: 92% 92% 93% 93% Weight:  77.6 kg (171 lb)   Height:     Intake/Output last 3 shifts:No  intake/output data recorded.Physical ExamGeneral: Awake, alert, NADHEENT: NC/ATCardiovascular: RRRPulmonary: diminished BS, normal WOB on room airExt: no BLE edema noted, R arm in slingNeurological: answers questions appropriately, no focal deficitsAccess: HDTC opaque dressing in place, c/d/iLab Results:Recent Labs Lab 09/04/250625 09/03/252134 09/03/251959 08/29/250000 Sodium 132*  --  135 140 Potassium 5.6*  --  5.3* 4.1 Chloride 87*  --  88* 98 CO2 20  --  22 23 UN 56*  --  48* 23* Creatinine 7.53*  --  6.66* 4.22* Glucose 82  --  92 129* Calcium  7.9*  --  7.8* 8.2* Albumin 3.2* 3.3*  --   --  Phosphorus 5.6*  --   --  3.3   Component Value Date/Time  PTH 309.0 (H) 10/17/2023 1644  Recent Labs Lab 09/03/251959 08/29/250000 WBC 10.9 12.2* Hemoglobin 10.4* 10.0* Hematocrit 36* 35* Platelets 337 229   Component Value Date/Time  FE 65 10/17/2023 1644  IBC 176 (L) 10/17/2023 1644  FESAT 37 10/17/2023 1644  FER 881 (H) 10/17/2023 1644  CT humerus RIGHT with contrastResult Date: 9/4/2025Slightly comminuted displaced and foreshortened oblique fracture through the proximal humeral diaphysis with posterior displacement. Associated adjacent soft tissue hematoma. Arthritic changes of the glenohumeral joint with subcortical cystic changes and possible erosions. Degenerative changes of the acromioclavicular joint prominent right axillary node measuring 1 cm, nonspecific, correlate clinically END OF IMPRESSION * Elbow RIGHT standard AP, Lateral, and both ObliquesResult Date:  9/4/2025No acute fracture or dislocation. END OF IMPRESSION I have personally reviewed the images and the Resident's/Fellow's interpretation and agree with or edited the findings. * Humerus RIGHT standard AP and  Lateral viewsResult Date: 9/3/2025Fracture of the proximal humeral shaft at the level of the deltoid tuberosity with lateral and  anterior displacement of the distal fragment as well as apex anterior angulation END OF IMPRESSION I have personally reviewed the images and the Resident's/Fellow's interpretation and agree with or edited the findings. *Chest standard frontal and lateral viewsResult Date: 9/3/2025Increased pulmonary edema. Trace right and moderate to large left pleural fluid, increased compared to prior study, with associated atelectasis. Superimposed infection is not excluded. END OF IMPRESSION I have personally reviewed the images and the Resident's/Fellow's interpretation and agree with or edited the findings. EKG 12 leadResult Date: 9/3/2025Sinus rhythm Ventricular premature complex Low voltage, extremity and precordial leads Prolonged QT interval Poor quality data, interpretation may be affectedShoulder RIGHT 3 or more viewsResult Date: 9/3/2025Mildly displaced proximal humeral shaft fracture. Diffuse osseous sclerosis. Central catheter terminates at the cavoatrial junction. Right-sided pleural effusion. Patchy airspace opacities in the right lower lung zone. Possible left-sided pleural effusion. END OF IMPRESSION I have personally reviewed the images and the Resident's/Fellow's interpretation and agree with or edited the findings.  Assessment/PlanGreg Pearson is a 63 y.o. 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 Ortho being consulted. Nephrology consulted for ESRD and provision of dialysis.Hemodialysis Note1. ESRD suspected to be 2/2 HTN on HD TTS at [facility] dialyze today with the following prescription.F180 Na 138 K 2 Ca 2.5 HCO3 35 Qb: 400 ml/min Qd: 1.5X Qb UF 1-2.5 L as hemodynamics tolerates Renally dose medications for CrCl < 10  mL/minContinue NephroviteEDW 75 kgFollow chemistriesElectrolytesHyponatremia likely 2/2 hypervolemia. Should improve with HD/UFHyperkalemia should correct with HDFollow chemistriesAcid-BaseWill keep within desired goal range (serum bicarb 22-25) with RRT against 35 bicarbAnemia: Goal hemoglobin of 10-11 gm/dl.Hgb 10.4No ESA indicated at this time, Defer with malignancyCKD-MBD: Hypocalcemia noted with hypoalbuminemia, corrected ok. MonitorRestart home calcitriol  0.75 mcg qTTSPhosphorus 5.6, should improve with HDContinue Sensipar  60 mg daily ( iPTH 759 on 10/24/23) Hemodynamics/ Orthostatic hypotensionSBP range: 80's - 110'sUF with HD as ableOn Midodrine  10 mg TID + florinefAccess: HDTCNext HD:  todayRemainder of management per primary team.Thank you for allowing us  to participate in the care of this patient.Please call with any questions. Attending recommendations to follow. Alan Contes, NPNephrology DivisionPager 93349/06/2023 8:21 AMNEPHROLOGY ATTENDING:Attending Addendum:I saw and evaluated the patient, and I provided the substantive portion the medical decision making. I approved the management plan. I take responsibility for the plan and the patient's management. The medical decision making was personally performed by me and any additions to our assessment and care plan are noted below:{We recommend the physician documents the problems addressed during the encounter. If using data category 2 or 3 (using independent interpretation of study or discussion with another provider) to support the level of service for the visit, it must be documented by the billing provider   I personally saw and evaluated Shane Pearson.  I performed a substantive portion of the visit. Please see note above for details.EVENTS:  Patient fractured arm with fall yesterday and is awaiting operative repair.  He is ESRD TTS  scheule.Gen:  Lying in bed, mild distress.Blood  pressure 102/59, pulse 80, temperature 36 C (96.8 F), resp. rate 20, height 1.753 m (5' 9), weight 77.6 kg (171 lb), SpO2 98%.Eyes:  Conj pale; gaze conjugate, no scleral icterusENT:  OP clear; MMMChest:  Clear with normal effort; HDTC in chest wallCardio-vascular:  Reg, no rub; extremities perfused; trace edemaAb:  Soft, nt, ndSkin:  No rashes or ulcers on visible skin. Neuro:  Alert, oriented, memory grossly intact; affect appropriateAssessment:  ESRD here with humerus fracture after fall.Plan:ESRD:  Will dialyze today per patient's usual prescription.Hypertension/Volume overload:  Controlled.Anemia:  hgb adequate.Hyperphosphatemia:  Mild.  Low phos diet.  Continue home binder regimen.Hyperkalemia:  Low K diet.  Dialysis today. Author: CHARLIE CATER, MD  as of: 11/14/2023  at: 2:51 PM    [1] Past Medical History:Diagnosis Date  Arthritis   CHF (congestive heart failure)   Colon polyp   Dialysis patient   ESRD (end stage renal disease)   GERD (gastroesophageal reflux disease)   Hypercholesterolemia   Hypotension   Ischemic colitis   Thyroid  disease   hypo [2] Past Surgical History:Procedure Laterality Date  DIALYSIS FISTULA CREATION Bilateral   4 fistulas have failed- ? d/t low BP and small veins  HERNIA REPAIR    PR LIGATION/BIOPSY TEMPORAL ARTERY Right 10/04/2023  Procedure: TEMPORAL ARTERY BIOPSY RIGHT EYE;  Surgeon: Lynn Ervin Edelman, MD;  Location[facility] MAIN OR;  Service: Ophthalmology  Right inguinal hernia repair   [3] Family HistoryProblem Relation Name Age of Onset  Diabetes Mother    Asthma Mother    Coronary artery disease Father   [4] AllergiesAllergen Reactions  Penicillins Swelling and Rash   Facial swellingMed History Tech discussed Penicillin allergy with  patient on 10/18/23. Please see Antimicrobial Stewardship Chart Note for additional details.  Amoxicillin Swelling and Rash   Facial swellingMed History Tech discussed Amoxicillin allergy with patient on 10/18/23. Please see Antimicrobial Stewardship Chart Note for additional details.  Baclofen Other (See Comments)   confusion  Coconut Nausea And Vomiting  No Known Latex Allergy

## 2023-11-14 NOTE — Provider Consult (Addendum)
 [facility] CONSULT NOTE FOR 9/4/2025Patient:Shane LytleMRN: [id number] DOA: 9/3/2025Reason for consult: R arm painHPI: Shane Pearson is a 63 y.o. male with PMH of ESRD on dialysis (T/T/S), stage IV renal cell carcinoma with intracranial and spinal mets,  who presents to the ED with r proximal humerus fx sustained after mGLF.  His walker was caught on a rug and he reached down to try to lift the walker and ended up hitting his right shoulder on the walker/washer. He heard a pop come from his right shoulder and had pain with decreased ROM. EMS brought him to urgent care. XR at [facility] showed a spiral humerus fracture. He says if he stays still he does not have pain. Any movement of the right arm causes significant pain  Denies hitting head, LOC, syncope, chest pain, shortness of breath, fever, chills, nausea, or vomiting.  . ROS: Complete review of systems was done, and pertinent positives/negatives are listed in HPI.EFY:[place] Active Problem List Diagnosis Code  ESRD (end stage renal disease) on dialysis N18.6, Z99.2  HLD (hyperlipidemia) E78.5  Hypotension I95.9  Ischemic colitis K55.9  Allergic rhinitis J30.9  Arteriosclerosis of artery of extremity I70.209  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  Embolism due to vascular prosthetic devices, implants and grafts, initial encounter T82.818A  Spondylosis of lumbosacral region without myelopathy or radiculopathy M47.817  Diarrhea R19.7  Failure to thrive in adult R62.7  History of temporal artery biopsy Z98.890  Leg weakness R29.898  Other closed displaced fracture of proximal end of right humerus, initial encounter S42.291A Past Medical History[1]PSH:Past Surgical History[2]Allergies:Allergies[3]SH: Social History[4]FH: Family History[5]Objective:Temp:  [35.5 C (95.9 F)-36.7 C (98.1 F)] 35.5 C (95.9 F)Heart Rate:  [80-88]  80Resp:  [15-20] 20BP: (85-126)/(45-76) 126/45Exam:General: Alert, no acute distressHeart: Regular rateLungs: No increased work of breathing or accessory muscle useMusculoskeletal:RUE: Skin intact. There is obvious deformity about the extremity  Active elbow flex/ ex diminishedPassive elbow flex / ex diminishedMuscles of shoulder, elbow, wrist, hand fire voluntarilyThere is tenderness to palpation of the shoulderSensation of shoulder, elbow, wrist, hand intact to light touchFingers warm with normal color and a brisk capillary refill throughout, radial pulse palpableAIN, PIN, and Ulnar nerve intact Labs:Recent Labs Lab 09/03/251959 08/29/250000 WBC 10.9 12.2* Hemoglobin 10.4* 10.0* Hematocrit 36* 35* Platelets 337 229 Recent Labs Lab 09/04/250625 09/03/251959 Sodium 132* 135 Potassium 5.6* 5.3* UN 56* 48* Creatinine 7.53* 6.66* Recent Labs Lab 09/03/252134 INR 1.2* Protime 13.3* No results for input(s): ESR, CRP in the last 168 hours.Imaging: XR: Fracture of the proximal humeral shaft at the level of the deltoid tuberosity with lateral and anterior displacement of the distal fragment as well as apex anterior angulation CT: Slightly comminuted displaced and foreshortened oblique fracture through the proximal humeral diaphysis with posterior displacemen Procedure: Sling appliedAssessment and Plan:  [name] is a 63 y.o. male with a R prox hum fx..Procedure as described abovePost-reduction films show acceptable reduction.WB status: NWB LUEPain Control, Elevate/ice injured extremityKeep splint/cast clean and dry, do not remove. Please provide instructions in discharge on splint/cast care. Ortho trauma to follow for possible extensive workup of other lesionsJoshua Pezzulo, MDOrthopaedic SurgeryI saw and evaluated the patient on 11/16/2023. I agree with the resident's/fellow's findings  and plan of care as documented. Details of my evaluation are as follows: Plan for multiple trips to OR.  Will start w/ Right prophylactic CMN today. Will need L CMN and plans for Monday embolization and Tuesday IMN of Right humerus with Dr. Debera.[name], MD 11/16/2023 1:18 PM Department  of Orthopaedic Surgery [1] Past Medical History:Diagnosis Date  Arthritis   CHF (congestive heart failure)   Colon polyp   Dialysis patient   ESRD (end stage renal disease)   GERD (gastroesophageal reflux disease)   Hypercholesterolemia   Hypotension   Ischemic colitis   Thyroid  disease   hypo [2] Past Surgical History:Procedure Laterality Date  DIALYSIS FISTULA CREATION Bilateral   4 fistulas have failed- ? d/t low BP and small veins  HERNIA REPAIR    PR LIGATION/BIOPSY TEMPORAL ARTERY Right 10/04/2023  Procedure: TEMPORAL ARTERY BIOPSY RIGHT EYE;  Surgeon: Lynn Ervin Edelman, MD;  Location: [facility] MAIN OR;  Service: Ophthalmology  Right inguinal hernia repair   [3] AllergiesAllergen Reactions  Penicillins Swelling and Rash   Facial swellingMed History Tech discussed Penicillin allergy with patient on 10/18/23. Please see Antimicrobial Stewardship Chart Note for additional details.  Amoxicillin Swelling and Rash   Facial swellingMed History Tech discussed Amoxicillin allergy with patient on 10/18/23. Please see Antimicrobial Stewardship Chart Note for additional details.  Baclofen Other (See Comments)   confusion  Coconut Nausea And Vomiting  No Known Latex Allergy  [4] Social HistorySocioeconomic History  Marital status: Married Occupational History  Occupation: Airline pilot Tobacco Use  Smoking status: Former   Packs/day: 0.30   Years: 15.00   Additional pack years: 0.00   Total pack years: 4.50   Types: Cigarettes   Quit date: 08/02/1992   Years since quitting: 31.3  Smokeless tobacco: Never  Substance and Sexual Activity  Alcohol use: No  Drug use: No [5] Family HistoryProblem Relation Name Age of Onset  Diabetes Mother    Asthma Mother    Coronary artery disease Father

## 2023-11-14 NOTE — Progress Notes (Signed)
 Assumed care of patient 0700-1900. VSS throughout shift. Patient off unit during shift today for imaging and dialysis. Patient given PRN Dilaudid  x1 for right shoulder pain with positive effect. No further concerns at this time. Please refer to patient flowsheets and eMAR for further assessments and documentation. Duwaine DEL., RN

## 2023-11-15 ENCOUNTER — Ambulatory Visit: Admitting: Radiation Oncology

## 2023-11-15 ENCOUNTER — Inpatient Hospital Stay

## 2023-11-15 DIAGNOSIS — C7931 Secondary malignant neoplasm of brain: Secondary | ICD-10-CM

## 2023-11-15 DIAGNOSIS — C801 Malignant (primary) neoplasm, unspecified: Secondary | ICD-10-CM

## 2023-11-15 DIAGNOSIS — R9089 Other abnormal findings on diagnostic imaging of central nervous system: Secondary | ICD-10-CM

## 2023-11-15 DIAGNOSIS — Z789 Other specified health status: Secondary | ICD-10-CM

## 2023-11-15 DIAGNOSIS — R9431 Abnormal electrocardiogram [ECG] [EKG]: Secondary | ICD-10-CM

## 2023-11-15 LAB — BASIC METABOLIC PANEL
Anion Gap: 20 — ABNORMAL HIGH (ref 7–16)
CO2: 22 mmol/L (ref 20–28)
Calcium: 7.7 mg/dL — ABNORMAL LOW (ref 8.6–10.2)
Chloride: 91 mmol/L — ABNORMAL LOW (ref 96–108)
Creatinine: 4.36 mg/dL — ABNORMAL HIGH (ref 0.67–1.17)
Glucose: 121 mg/dL — ABNORMAL HIGH (ref 60–99)
Lab: 29 mg/dL — ABNORMAL HIGH (ref 6–20)
Potassium: 4.9 mmol/L (ref 3.3–5.1)
Sodium: 133 mmol/L (ref 133–145)
eGFR BY CREAT: 14 — AB

## 2023-11-15 LAB — CBC AND DIFFERENTIAL
Baso # K/uL: 0 THOU/uL (ref 0.0–0.2)
Eos # K/uL: 0 THOU/uL (ref 0.0–0.5)
Hematocrit: 33 % — ABNORMAL LOW (ref 37–52)
Hemoglobin: 10.1 g/dL — ABNORMAL LOW (ref 12.0–17.0)
IMM Granulocytes #: 0.1 THOU/uL — ABNORMAL HIGH
IMM Granulocytes: 1.3 %
Lymph # K/uL: 0.6 THOU/uL — ABNORMAL LOW (ref 1.0–5.0)
MCV: 89 fL (ref 75–100)
Mono # K/uL: 0.7 THOU/uL (ref 0.1–1.0)
Neut # K/uL: 9.1 THOU/uL — ABNORMAL HIGH (ref 1.5–6.5)
Nucl RBC # K/uL: 0.2 THOU/uL — ABNORMAL HIGH (ref 0.0–0.1)
Nucl RBC %: 1.6 /100{WBCs} — ABNORMAL HIGH (ref 0.0–0.2)
Platelets: 428 THOU/uL (ref 150–450)
RBC: 3.8 MIL/uL — ABNORMAL LOW (ref 4.0–6.0)
RDW: 23.1 % — ABNORMAL HIGH (ref 0.0–15.0)
Seg Neut %: 86.4 %
WBC: 10.5 THOU/uL (ref 3.5–11.0)

## 2023-11-15 LAB — EKG 12-LEAD
P: 50 deg
PR: 156 ms
QRS: 28 deg
QRSD: 82 ms
QT: 354 ms
QTc: 431 ms
Rate: 89 {beats}/min
T: 47 deg

## 2023-11-15 LAB — MAGNESIUM: Magnesium: 1.8 mg/dL (ref 1.6–2.5)

## 2023-11-15 LAB — SURGICAL PATHOLOGY

## 2023-11-15 LAB — PHOSPHORUS: Phosphorus: 4.1 mg/dL (ref 2.7–4.5)

## 2023-11-15 MED ORDER — GADOPICLENOL 0.5 MMOL/ML (VUEWAY) IV SOLN *I*
0.1000 mL/kg | Freq: Once | INTRAVENOUS | Status: AC
Start: 2023-11-15 — End: 2023-11-15
  Administered 2023-11-15: 7.76 mL via INTRAVENOUS

## 2023-11-15 NOTE — Progress Notes (Signed)
 EvaluationDischarge Recommendations:  SNF-Rehab. However, patient reports he strongly prefers Home discharge. If patient returs home he will require additional OT visits for caregiver training in functional transfers, ADLs. Anticipate patient will benefit from stand aide for transfers (defer to PT).   OT need to see patient prior to discharge: YesEquipment Recommendations: 3:1 Commode and additional equipment TBD  Additional justification: Standard 3 in 1 Commode. Recommending a 3 in 1 Commode to place bedside as pt will be staying/confined to (first floor/1 room) at discharge. A 3 in 1 commode is necessary for toileting transfers, and to reduce the burden of care on caregiver/family with transfers and toileting tasks.OT Hospital Stay Recommendations: Encourage participation with ADLs, Toilet transfer status - 2PA stand pivot with gait belt, HHA on left side, and OOB for meals  OT Referral Recommendations: Physical Therapy and Social WorkHPI:Admitting Dx: Active Hospital Problems  *Other closed displaced fracture of proximal end of right humerus, initial encounter  PMH: Past Medical History[1]PSH: Past Surgical History[2]SUBJECTIVE and OBJECTIVE:Precautions: Fall Precautions: General fall precautionsWeight Bearing Status: NWB RUE  Brace Applied: SlingL/D/A: IV linesActivity order: Activity as toleratedPatient wearing mask: NoWriter wearing PPE including: Mask and GlovesAdditional Observations: Seen in presence of supportive spouse. Home Living Prior to Admission: Type of home: [place] home# of steps to enter home:  2 (looking into getting ramp for entrance through garage)# of steps in home: 0Bedroom: First floorBathroom: First floorBathroom (shower/tub): Walk-in showerHome equipment: Rollator, Cane (straight), Rolling walker, and adjustable bed, stair lift, crutches, shower chair, grab bars in showerPrior  Function:Resides with: Spouse and motherLevel of independence:ADLs: Needs assistanceIADLs: Needs assistanceFunction mobility: ambulates short distances with RW, w.c. for longer distanceReceives help from: Family and - spouse is HHA, assists with sponge bathing (does not use shower), dressing, independent with toileting ; assist with all IADLsPain: Did not quantifyLocation: R armDescription: did not describeIntervention: refer to RN for pain mgmt and denies pain at rest, Frequently grimacing with movement. Requests pain meds by end of session, nursing notified. ASSESSMENTCognition: Alertness - AlertAttention - IntactFollowing commands - IntactAwareness- IntactVision: Wears corrective lensesPerception: IntactUE Function: ROM: RUE: not tested LUE: WFL  Strength: RUE: NT due to precautions LUE: WFL  Sensation: Intact   Coordination: NTFunctional mobility:Balance: TestedStatic sitting: Good, Dynamic sitting: Good, Static standing: Good, supportedDynamic standing: Fair, supportedBed Mobility: TestedSupine to sit: moderate assist, HOB flat and per patient request, prefers HOB flatSit to supine: minimal assist, HOB elevated     Functional Transfers: TestedSit to stand: minimal assist, No deviceStand to sit: minimal assist, No deviceStand Pivot Transfer: minimal assist, Gait belt and HHA on L sideToilet transfer: moderate assist, Gait belt and from commode -Patient completed sit<>stand from EOB with bed height elevated, completed SPT to commode. Rquired increased assist to stand from commode due to lower surface height. RW  used for unilateral support in standing while Clinical research associate completed peri care, Acupuncturist. SPT back to EOB with HHA on L, gait belt. ADL Assessment:  ADLs completed/simulated with level of assist as dictated below: UB Dressing: maximum assist with hemi-dressing techniquesLB Dressing:  maximum assist to don shoes at EOBToileting: dependent clothing managemetn up/down, peri care after BM on commodeTreatment Provided: Educated in role of OT, recommendation for rehab prior to discharge, hemi techniques for ADLs. ASSESSMENT and PLANAssessment: Patient currently presenting with impaired ADL status, impaired self-care transfers, impaired UE ROM, impaired balance, and impaired endurance and will benefit from OT service to address ADL retraining, functional transfer training, UE strengthening/ROM, endurance training, patient/family training, equipment eval/education, and compensatory technique  education.Frequency: 3-5x/weekPatient Education: role of OT, plan of care, discharge recommendations, ADL and/or IADLs modification/compensatory strategies, and equipment recommendations Learners: patient and family Readiness: acceptance Method: explanation Response: verbalizes understandingMultidisciplinary Communication:  patient, RN, PT, spouse  OCCUPATIONAL THERAPY PROVIDER Electronically Signed By: Olam Brighter, OTPlease contact the OT via Secure Chat to: [facility] First [facility] with any questions/concerns and/or update requests.Timed Calculations:Timed Codes:  ADL x 10Untimed Codes: OT eval x 15Unbilled Time: 0Total Time:  25 minOT EVALUATION COMPLEXITY 1.  Occupational Profile & History (Medical/Therapy) Moderate (Expanded Review)2.  Public relations account executive (Includes occupations from: ADL, IADL, Rest/Sleep, Education, Work, Copywriter, advertising, Leisure, Social Participation) High (5+ occupations/performance deficits)3.  Clinical Decision Making i  Assessment  Moderate (Detailed) ii  Co-Morbidities  Moderate (1+) iii  Modifications  Moderate (Minimum - Moderate) iv Treatment Options (Approaches include: Create, Promote, Establish, Restore, Maintain, Modify, Prevent)  Moderate (Several) Moderate4.   EVALUATION COMPLEXITY as based on the above provided information Moderate [1] Past Medical History:Diagnosis Date  Arthritis   CHF (congestive heart failure)   Colon polyp   Dialysis patient   ESRD (end stage renal disease)   GERD (gastroesophageal reflux disease)   Hypercholesterolemia   Hypotension   Ischemic colitis   Thyroid  disease   hypo [2] Past Surgical History:Procedure Laterality Date  DIALYSIS FISTULA CREATION Bilateral   4 fistulas have failed- ? d/t low BP and small veins  HERNIA REPAIR    PR LIGATION/BIOPSY TEMPORAL ARTERY Right 10/04/2023  Procedure: TEMPORAL ARTERY BIOPSY RIGHT EYE;  Surgeon: Lynn Ervin Edelman, MD;  Location: [facility] [address] MAIN OR;  Service: Ophthalmology  Right inguinal hernia repair

## 2023-11-15 NOTE — Plan of Care (Addendum)
 Orthopaedic Surgery Progress Note: 9/5/2025NameJamesen Pearson: [id number] DOA: 11/13/2023 Events / Subjective Patient seen at bedside, discussed with patient and wife (on phone) orthopaedic plan of care in regards to surgical plan for R humerus, BL femurs. Had a long discussion with both patient and wife via telephone regarding findings of x-ray, and prominent pain with functional pain in his bilateral lower extremities that has been worsening over the past several months.  Discussed with patient and wife that we will plan to proceed with prophylactic fixation of his right femur tomorrow, 9/6, with a intramedullary nail.  Also discussed with patient that we will plan to fix his right humerus on Tuesday with an intramedullary nail, per Dr. Debera.  And further discussed that in a few weeks time, we will plan for prophylactic fixation of his left femur with a cephalomedullary nail, at a time TBD.  We discussed the reasoning behind prophylactic fixation of his bilateral femurs.  Discussed that there are concerning findings on his x-rays, that, in combination with his functional pain, would be pertinent to fix prophylactically to prevent fracture, further injury, additional pain, and allow him to continue to safely weight-bear on his bilateral lower extremities.  Patient and wife expressed understanding of the above discussion.  Patient and his wife discussed that after some thought, they would like to proceed with surgical intervention of his bilateral lower extremities, right upper extremity.  Plan of care discussed extensively with both treatment teams here at the cancer center, and internal medicine.  Will outline the details of plan of care below.Right femur: Prophylactic fixation of the right femur Saturday, 9/6/2025Patient will need dialysis prior to surgery, discussed with primary team and is slotted for first dialysis session on SaturdayRight humerus: Fixation of right proximal,  humeral diaphysis fracture Tuesday, 11/19/2023 with Dr. Murel need IR embolization of his renal cell tumor on Monday, 9/6/2025IR has been notified via epic chat, IR has been paged and consultedIR will plan for embolization on Monday, please notify orthopedics if changes to this plan of careWill recommend dialysis Monday evening if possible in preparation for the operating roomLeft femur: Prophylactic fixation of the left femur, timing TBDWill need dialysis prior to this procedure.  Orthopedics will continue to be engaged in discussion and care of patient  Plan to take patient to the OR for Right prophylactic CMN tomorrow, 11/16/23 In preparation for the OR, please: NPO + IVF after midnightAncef  OCTORPlease ensure up to date pre-op labs: BMP, CBC, PT/INR, Type and ScreenEKGPain controlConsent signed and in the chartPlease page orthopaedic surgery resident for any questions/ concerns.CESARIO DORTHEA MANIFOLD, MD Orthopedic Surgery Resident8:48 PM 11/15/23

## 2023-11-15 NOTE — Plan of Care (Signed)
 [facility] Hospital Medicine Attending Plan of Care Note:-MR head w/wo contrast to evaluate the extent of intracranial metastases-Radiation oncology consulted for consideration of XRT provision to the brain; awaiting MR result-Plan for prophylactic IMN placement on 9/6 by orthopedic surgery - Make NPO at midnight and check CBC, PT-INR, Type and Screen with AM labs-Patient arranged for HD on 9/6 in the morning (ie after receiving gadolinium-based IV contrast and before planned surgery)-Continue oxyIR 5/10 mg q4h for pain; low threshold to intensify pain control peri-operatively if neededFull daily progress note to follow.Lamar MYRTIS Reap, MD, MPHAttending Physician, [facility] [phone]

## 2023-11-15 NOTE — Progress Notes (Signed)
 11/15/23 0920 [facility] Patient Class Review Patient Class Review Inpatient Patient class effective as of 11/13/23.   Freddy Frank RN BSNUtilization Management Secure chat or [phone]

## 2023-11-15 NOTE — Progress Notes (Signed)
 Hematology Oncology Hospitalist Progress Note  LOS: 2 days  Full Code Significant Events/Subjective: - No acute changes in health status overnight.-Shane Pearson continues to report pain in his upper right arm with nearly any movement.  Today, he also reports pain in his legs bilaterally in addition to ongoing weakness..- He reports no dyspnea.-I met later in the day with Shane Pearson and his wife, Shane Pearson, to discuss updates to his plan of care.  We discussed plan for staged prophylactic nailing of his femurs bilaterally as well as operative fixation of his right humerus fracture to be proceeded by IR embolization.ROS: See aboveObjective: Physical ExamTemp:  [35.5 C (95.9 F)-36.8 C (98.2 F)] 36.8 C (98.2 F)Heart Rate:  [76-91] 76Resp:  [18-20] 20BP: (102-138)/(45-76) 125/46 Physical ExamVitals reviewed. Constitutional:     General: He is not in acute distress.Eyes:    Extraocular Movements: Extraocular movements intact. Neck: Cardiovascular:    Rate and Rhythm: Normal rate and regular rhythm.    Heart sounds: Normal heart sounds. Pulmonary:    Effort: Pulmonary effort is normal. No tachypnea.    Breath sounds: Examination of the right-lower field reveals decreased breath sounds. Examination of the left-lower field reveals decreased breath sounds. Decreased breath sounds present. Abdominal:    General: Abdomen is flat. Bowel sounds are normal.    Palpations: Abdomen is soft.    Tenderness: There is no abdominal tenderness. Musculoskeletal:    Right upper arm: Swelling (RUE held in sling) present. No deformity. Neurological:    Mental Status: He is alert.    Motor: Motor function is intact. No atrophy. Recent Lab Studies:Personally reviewed and notable qnm:[facility] Labs Lab 09/05/250151 09/03/251959 WBC 10.5 10.9 Hemoglobin 10.1* 10.4* Hematocrit 33* 36* Platelets 428 337  Recent Labs Lab  09/05/250151 09/04/250625 09/03/252134 09/03/251959 Sodium 133 132*  --  135 Potassium 4.9 5.6*  --  5.3* Chloride 91* 87*  --  88* CO2 22 20  --  22 UN 29* 56*  --  48* Creatinine 4.36* 7.53*  --  6.66* Glucose 121* 82  --  92 Calcium  7.7* 7.9*  --  7.8* Albumin  --  3.2* 3.3*  --  Phosphorus 4.1 5.6*  --   --  Recent Labs Lab 09/03/252134 INR 1.2* Protime 13.3* PATHOLOGY:Paraspinal muscle biopsy (8/29): -Metastatic carcinoma involving skeletal muscle.  COMMENT: The specimen is compared to the prior biopsy ([id number]) and is similar in morphology. Immunohistochemistry shows that the tumor cells are positive for pancytokeratin and PAX8, with focal CAIX positivity, while negative for GATA3, CK7, and CK20. These findings are most consistent with a renal primary. Intradepartmental consultation was obtained ([facility] pathology). Recent Imaging Studies:Personally reviewed and notable for:EKG 12 leadResult Date: 9/5/2025Sinus rhythm Low voltage, extremity and precordial leads* Humerus LEFT standard AP and  Lateral viewsResult Date: 9/4/2025No acute osseous abnormality in the left humerus. Sequela of chronic rotator cuff disease with bony irregularity of the greater tuberosity, as seen on prior imaging. Stable mild acromioclavicular arthrosis and mild glenohumeral arthrosis. END OF IMPRESSION I have personally reviewed the images and the Resident's/Fellow's interpretation and agree with or edited the findings. * Femur RIGHT standard AP and LateralResult Date: 9/4/2025Patchy areas of lucency in the proximal femur and sclerosis in the ischium, similar to prior. No pathologic fracture. No acute osseous findings. END OF IMPRESSION I have personally reviewed the images and the Resident's/Fellow's interpretation and agree with or edited the findings. * Femur LEFT standard AP and LateralResult Date: 9/4/2025Patchy sclerotic and lucent  lesions in the proximal femur and  ischium, grossly unchanged compared to prior radiographic imaging. No pathologic fracture. No acute osseous abnormality. END OF IMPRESSION I have personally reviewed the images and the Resident's/Fellow's interpretation and agree with or edited the findings. Current Inpatient Medications: atorvastatin   20 mg Oral Daily with dinner  B complex-vitamin C-folic acid   1 tablet Oral Daily  cinacalcet   60 mg Oral QPM  dexAMETHasone   2 mg Oral BID WC  fludrocortisone   100 mcg Oral TID  fluticasone   1 spray Nasal Daily  magnesium  oxide  400 mg Oral Daily  midodrine   10 mg Oral TID  pantoprazole   40 mg Oral QAM  senna  1 tablet Oral Nightly  acetaminophen   1,000 mg Oral TID DiphenhydrAMINE -Lidocaine -Maalox (BMX/First Mouthwash), sodium chloride , dextrose , melatonin, polyethylene glycol, heparin  (porcine), heparin  (porcine), oxyCODONE  **OR** oxyCODONE , sodium chloride , dextrose , naloxoneAssessment: Mr. 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  Plans: ONC: Stage IV carcinoma, renal cell:-Tissue origin confirmed today on biopsy of 8/29-Followed outpatient by Dr. Clelia head w/wo contrast today: -Radiation oncology consulted for consideration of provision of brain XRT-S/p 5 fractions of radiation to the cervical spine 9/3-Continue Decadron  2mg  BID for neurologic symptoms-Orthopedic surgery consulting: -Plan for staged prophylactic IMN to femurs B/L: First surgery on 9/6 (NPO at MN, PT-INR, CBC, Type & Screen pre-op).  Timing of IMN of left femur TBD Mechanical fall resulting in R spiral humeral shaft fracture, concern for pathologic fx -In sling, NWB-Orthopedic surgery following: -Plan for IR  embolization on 9/8 followed by fixation, planned tentatively for 9/9-Pain control            -Tylenol  1g TID             -OxyIR 5/10 mg q4h PRN            -Decadron  as above  Moderate to Large L Pleural Effusion, Pulmonary Edema-Transiently hypoxic to 89% but now satting well on RA and is asymptomatic -Hx of malignant pleural effusion, consider thora -Fluid removal with HD  Hypotension, chronic:-Continue midodrine  10 mg TID-Florinef  100 mcg TID Hyperlipidemia-Holding home Aspirin  -Home atorvastatin  20 mg daily ESRD on HD TTS-Nephro-Vite daily-Cinacalcet  60 mg daily-Nephrology consulting for provision of HD -Requires HD on 9/6 in the morning after contrasted MR study and prior to surgery GERD-Pantoprazole  40mg  daily F: OralE: BMP, Mg2+, phosphorus dailyN: NPO at MNDVT PPx: On holdCode Status: Full CodeDischarge Planning: (EDD):  9/12Discharge Criteria/Barriers to Discharge: Multiple surgical procedures plannedPT and/or OT Recommendations/Discharge to: TBDAppointments Needed with: Orthopedic surgeryPlan of care discussed with orthopedic surgery attending Dr. Debera, Dr. Bronson, IR attending Dr. Governor, oncology PA Yetta Brash and bedside RN. >85 minutes spent in care of this patient, primarily in extensive discussions with the above names providers re: plans of care.Lamar MYRTIS Reap, MD, MPHAttending Physician, [facility] [phone] [facility]

## 2023-11-15 NOTE — Plan of Care (Addendum)
 This morning, I reviewed his clinical case with Dr. Londa.  I discussed that Shane Pearson's clinical decline is likely due to rapid progressive metastatic carcinoma, likely of renal cell primary.  However, we are awaiting repeat biopsy performed on 11/08/23 to confirm histology.  Depending on the result of his biopsy, we will consider starting him on urgent systemic treatment, as this may improve his clinical status if his cancer responds.  In the mean time, I recommend MRI brain to restage disease due to concern for intracranial metastases.Prakriti Carignan Bronson, MDProfessor of Medicine and Hydrographic surveyor of Solid Tumor Oncology ProgramJames P. Foxfield Cancer InstituteDivision of Hematology/OncologyDepartment of MedicineAddendumThis afternoon, I reviewed Shane Pearson's clinical case with Dr. Devere Sierras who is evaluating Shane Pearson for prophylactic surgeries to prevent pathologic fractures of femurs.  I reviewed with Dr. Sierras that Shane Pearson has metastatic carcinoma of likely renal origin and the goal of treatment is palliative in nature.  The treatment would likely include doublet treatment with VEGF inhibitors and immune checkpoint inhibitors.  During healing after surgery, we can consider holding VEGF inhibitors and consider immune checkpoint inhibitor alone.  According to Dr. Sierras, it would likely take 2 to 3 weeks for recoevery after surgery.Kenichi Cassada Bronson, MDProfessor of Medicine and Hydrographic surveyor of Solid Tumor Oncology ProgramJames P. Garza-Salinas II Cancer InstituteDivision of Hematology/OncologyDepartment of Medicine

## 2023-11-15 NOTE — Invasive Procedure Plan of Care (Signed)
 [facility]                            Patient Name: Shane Pearson 89 MR                                                              DOB: 03-28-1960     Please read this form or have someone read it to you. It's important to understand all parts of this form. If something isn't clear, ask us  to explain. When you sign it, that means you understand the form and give us  permission to do this surgery or procedure. I agree for Orthopaedic Trauma Surgery Attending along with any assistants* they may choose, to treat the following condition(s): right femur impending pathologic fracture By doing this surgery or procedure on me: right femur internal fixation, cephalomedullary nail, all indicated procedures This is also known as: fixing a broken bone prophylactically Laterality: right1. The care provider has explained my condition to me. They have told me how the procedure can help me. They have told me about other ways of treating my condition. I understand the care provider cannot guarantee the result of the procedure. If I don't have this procedure, my other choices are: No Surgery; Non-operative management2. The care provider has told me the risks (problems that can happen) of the procedure. I understand there may be unwanted results. The risks that are related to this procedure include: - Bleeding- Infection- Damage to surrounding structures (muscles, tendons, nerves, blood vessels), - Failure of the bone to heal,- Failure of the wound to heal, - Hardware failure, - Need for further surgery- Persistent pain- Risks of anesthesia (including but not limited to heart attack, stroke and death)- Deep vein thrombosis- Pulmonary embolus3. I understand that during the procedure, my care provider may find a condition that we didn't know about before the treatment started.  Therefore, I agree that my care provider can perform any other treatment which they think is necessary and available.4. I understand the care provider may remove tissue, body parts, or materials during this procedure. These materials may be used to help with my diagnosis and treatment. They might also be used for teaching purposes or for research studies that I have separately agreed to participate in. Otherwise they will be disposed of as required by law.5. My care provider might want a representative from a medical device company to be there during my procedure. I understand that person works for:  [employer], Publishing [employer], [employer], Publishing rights manager, [employer], [employer]      The ways they might help my care provider during my procedure include:      Helping the operating room staff prepare the special device my care provider wants to use and Providing information and support to operating room staff regarding the device6. Here are my decisions about receiving blood, blood products, or tissues. I understand my decisions cover the time before, during and after my procedure, my treatment, and my time in the hospital. After my procedure, if my condition changes a lot, my care provider will talk with me again about receiving blood or blood products. At that time, my care provider might need me to review and sign  another consent form, about getting or refusing blood.I understand that the blood is from the community blood supply. Volunteers donated the blood, the volunteers were screened for health problems. The blood was examined with very sensitive and accurate tests to look for hepatitis, HIV/AIDS, and other diseases. Before I receive blood, it is tested again to make sure it is the correct type.My chances of getting a sickness from blood products are small. But no transfusion is 100% safe. I understand that my care provider feels the good I will receive from the blood is greater than  the chances of something going wrong. My care provider has answered my questions about blood products.My decisionabout blood orblood products Yes, I agree to receive blood or blood products if my care provider thinks they're needed.  My decision about tissueImplants Yes, I agree to receive tissue implants if my care provider thinks they're needed.  I understand thisform.My care provideror his/herassistants haveexplained: What I am having done and why I need it.What other choices I can make instead of having this done.The benefits and possible risks (problems) to me of having this done.The benefits and possible risks (problems) to me of receiving transplants, blood, or blood products.There is no guarantee of the results.The care provider may not stay with me the entire time that I am in the operating or procedure room.My provider has explained how this may affect my procedure. My provider has answered myquestions about this. I give mypermission forthis surgery orprocedure.        _______________________________________________                                   My signature(or parent or other person authorized to sign for you, if you are unable to sign foryourself or if you are under 28 years old)    9/5/2025______         Date 4:07 PM   _____      Time Electronic Signatures will display at the bottom of the consent form.Care provider's statement: I have discussed the planned procedure, including the possibility for transfusion of bloodproducts or receipt of tissue as necessary; expected benefits; the possible complications and risks; and possible alternativesand their benefits and risks with the patients or the patient's surrogate. In my opinion, the patient or the patient's surrogateunderstands the proposed procedure, its risks, benefits and alternatives.      Electronically signed by: CESARIO DORTHEA MANIFOLD, MD                     11/15/2023      Date    4:07 PM      Time Your doctor or someone your doctor has appointed has told you that you may need blood or a blood product transfusion, which has been collected from volunteers, as part of your treatment as a patient.  The reasons you might need blood or blood products include, but are not limited to:  Significant loss of your own blood Your body may not be getting enough oxygen to its tissues  Treatment of bleeding disorders caused by low platelet counts or platelets that do not work right (platelets are part of a cell that helps to form clots and keeps you from bleeding too much). You may not have enough of other substances that help your blood clot or stop you from bleeding moreThe risks of getting a transfusion of blood or blood products include, but  are not limited to:  Damage to the lungs Difficulty breathing due to fluid in the lungs The product may contain bacteria or rarely a virus (which includes HIV and Hepatitis) Blood from the community blood supply has been collected from volunteer donors who have been screened for health risk. The blood has been tested for major blood transmitted disease, but no transfusion is 100% safe. The blood is tested with very sensitive and accurate tests to screen for hepatitis, AIDS, and other disease, which makes the risks very small. You may have side effects from the transfusion (rash, fever, chills) or an allergic reaction The transfusion increases your risks of getting infection or cancer coming back The transfusion can increase the time you have to stay in the hospital The transfusion can potentially cause death if the wrong blood is given or your body rejects the blood Before blood is transfused, it is tested again to make sure it is the correct typeThere are other options than getting blood or blood products from other people and they include:  Drugs  which can decrease bleeding Drugs which can cause your body to make more blood (used in elective procedures with advance notice) Autologous (your own blood) donation (needs pre-arrangement) No transfusionIf you exercise your right to refuse to be transfused with blood or blood products; these things listed below, among others, could happen to you: Your body may not get enough oxygen and suffer damage You may have a higher chance of bleeding You may limit other options for your condition You may die from losing too much blood

## 2023-11-15 NOTE — Progress Notes (Signed)
 11/15/23 1427 Evaluation Attempted Evaluation Attempted Evaluation Attempted Evaluation Attempt Comment attempted to see patient this afternoon for PT evalution. Patient decline and adamant about eating lunch at this time. Will reattempt evaluation later today vs tomorrow if schedule allows. Hosey Greet, PT, DPTPlease contact via secure chat or VoceraOn weekends, please utilize the secure chat group, [facility] 1st call, to contact PT

## 2023-11-15 NOTE — Plan of Care (Signed)
 Interventional Radiology Preprocedure NoteReferring Provider: Londa Lamar Shu, *Shane Pearson is a 63 y.o. 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 Plan is for OR with orthopedic surgery on 9/9 and Interventional Radiology has been consulted for prophylactic embolization prior to the OR.Relevant Imaging: N/aAssessment/Plan: Will plan on prophylactic embolization prior to OR fixation of right humeral fracture. IR will generate order for IR body embolization.Plan to perform the procedure tentatively on 9/8 AM based on room availability. The bedside nurse will be contacted once scheduled.Plan for moderate sedationPreprocedural Requirements: Need to hold prophylactic DVT medications? No Are labs up to date (INR, plts, COVID)? Yes Does patient need to be NPO Yes, initiate midnight prior to procedure Contrast allergy? NoLink to PolicySTAT - Reaction Severity and Premedication Bleeding concerns requiring medication hold (anticoagulants, anti-platelets) and plan? NoHold None Labs:  Lab results: 09/05/250151 WBC 10.5 Hemoglobin 10.1* Hematocrit 33* RBC 3.8* Platelets 428    Lab results: 09/03/252134 INR 1.2* Recent Results (from the past 744 hours) COVID/Influenza A & B/RSV NAAT (PCR)  Collection Time: 11/13/23  8:04 PM Result Value Ref Range Status  COVID-19 Source  Nasopharyngeal  Final  COVID-19 NAAT (PCR) NEGATIVE Negative Final  Influenza A NAAT (PCR) NEGATIVE Negative Final  Influenza B NAAT (PCR) NEGATIVE Negative Final  RSV NAAT (PCR) NEGATIVE Negative Final Please page IR CONSULT Pager with questions.Signed by Pretty Weltman (Raponi) Doloros Kwolek, PA on 11/15/2023 at 4:05 PM

## 2023-11-15 NOTE — Progress Notes (Addendum)
 Brief Orthopaedic Surgery Progress NotePatient:Shane LytleMRN: [id number] DOA: 9/3/2025Ortho Progress Note for 9/5/2025Patient see for further evaluation of pain today. Notes that the R shoulder began hurting in December, he fell into washer 9/3 with onset of R increased R shoulder pain, with radiographs on arrival to [facility] demonstrating a R proximal 1/3rd humeral shaft fracture. Prior hip pain ble however this pain now is different. Radiates to groin  and down to the thigh but stops knee level. LUE feels more nerve like with burning and believes weakness is 2/2 nerve compression as well. R shoulder pain had resolved 4 months prior. Pain worse with ambulation/weight bearing than at rest. BLE: TTP bilateral hips laterally,  ROM  painful flex extend at the hip. + 5/5 flex extend at hip knee ankle and toes. No ttp tibia distally. Mild calf tenderness which feels different from BL hip pain, 2+ DP/PTLUE: Mild TTP L proximal humerus, No TTP throughout rest of extremity. Abudction active limted to 80 degrees. SILT detl, median, ulnar radial nerves. Acvtive flex/extend at elbow wrist and composite first without pain. [facility] R hip prophylactic fixation. IR emobolization Monday R proximal humerus. OR Tuesday R proximal humerus ORIF. Possible CT LUE and LLE pending attending discussion. Fonda Plate, MDOrthopaedic Surgery Resident

## 2023-11-15 NOTE — Invasive Procedure Plan of Care (Signed)
 [facility]                            Patient Name: Shane Pearson 89 MR                                                              DOB: 12/29/1960     Please read this form or have someone read it to you. It's important to understand all parts of this form. If something isn't clear, ask us  to explain. When you sign it, that means you understand the form and give us  permission to do this surgery or procedure. I agree for Orthopaedic Trauma Surgery Attending along with any assistants* they may choose, to treat the following condition(s): Right humerus pathologic fracture By doing this surgery or procedure on me: right humerus open versus closed reduction, internal fixation, intramedullary nail, all indicated procedures This is also known as: fixing a pathologic fracture Laterality: right1. The care provider has explained my condition to me. They have told me how the procedure can help me. They have told me about other ways of treating my condition. I understand the care provider cannot guarantee the result of the procedure. If I don't have this procedure, my other choices are: No Surgery; Non-operative management2. The care provider has told me the risks (problems that can happen) of the procedure. I understand there may be unwanted results. The risks that are related to this procedure include: - Bleeding- Infection- Damage to surrounding structures (muscles, tendons, nerves, blood vessels), - Failure of the bone to heal,- Failure of the wound to heal, - Hardware failure, - Need for further surgery- Persistent pain- Risks of anesthesia (including but not limited to heart attack, stroke and death)- Deep vein thrombosis- Pulmonary embolus3. I understand that during the procedure, my care provider may find a condition that we didn't know about before the treatment  started. Therefore, I agree that my care provider can perform any other treatment which they think is necessary and available.4. I understand the care provider may remove tissue, body parts, or materials during this procedure. These materials may be used to help with my diagnosis and treatment. They might also be used for teaching purposes or for research studies that I have separately agreed to participate in. Otherwise they will be disposed of as required by law.5. My care provider might want a representative from a medical device company to be there during my procedure. I understand that person works for:  [employer], [employer], [employer], Publishing rights manager, [employer], [employer]      The ways they might help my care provider during my procedure include:      Helping the operating room staff prepare the special device my care provider wants to use and Providing information and support to operating room staff regarding the device6. Here are my decisions about receiving blood, blood products, or tissues. I understand my decisions cover the time before, during and after my procedure, my treatment, and my time in the hospital. After my procedure, if my condition changes a lot, my care provider will talk with me again about receiving blood or blood products. At that time, my care provider might need me to review  and sign another consent form, about getting or refusing blood.I understand that the blood is from the community blood supply. Volunteers donated the blood, the volunteers were screened for health problems. The blood was examined with very sensitive and accurate tests to look for hepatitis, HIV/AIDS, and other diseases. Before I receive blood, it is tested again to make sure it is the correct type.My chances of getting a sickness from blood products are small. But no transfusion is 100% safe. I understand that my care provider feels the good I will receive from the blood is  greater than the chances of something going wrong. My care provider has answered my questions about blood products.My decisionabout blood orblood products Yes, I agree to receive blood or blood products if my care provider thinks they're needed.  My decision about tissueImplants Yes, I agree to receive tissue implants if my care provider thinks they're needed.  I understand thisform.My care provideror his/herassistants haveexplained: What I am having done and why I need it.What other choices I can make instead of having this done.The benefits and possible risks (problems) to me of having this done.The benefits and possible risks (problems) to me of receiving transplants, blood, or blood products.There is no guarantee of the results.The care provider may not stay with me the entire time that I am in the operating or procedure room.My provider has explained how this may affect my procedure. My provider has answered myquestions about this. I give mypermission forthis surgery orprocedure.        _______________________________________________                                   My signature(or parent or other person authorized to sign for you, if you are unable to sign foryourself or if you are under 71 years old)    9/5/2025______         Date 4:09 PM   _____      Time Electronic Signatures will display at the bottom of the consent form.Care provider's statement: I have discussed the planned procedure, including the possibility for transfusion of bloodproducts or receipt of tissue as necessary; expected benefits; the possible complications and risks; and possible alternativesand their benefits and risks with the patients or the patient's surrogate. In my opinion, the patient or the patient's surrogateunderstands the proposed procedure, its risks, benefits and alternatives.      Electronically signed by:  CESARIO DORTHEA MANIFOLD, MD                     11/15/2023      Date    4:09 PM      Time Your doctor or someone your doctor has appointed has told you that you may need blood or a blood product transfusion, which has been collected from volunteers, as part of your treatment as a patient.  The reasons you might need blood or blood products include, but are not limited to:  Significant loss of your own blood Your body may not be getting enough oxygen to its tissues  Treatment of bleeding disorders caused by low platelet counts or platelets that do not work right (platelets are part of a cell that helps to form clots and keeps you from bleeding too much). You may not have enough of other substances that help your blood clot or stop you from bleeding moreThe risks of getting a transfusion of blood or blood products  include, but are not limited to:  Damage to the lungs Difficulty breathing due to fluid in the lungs The product may contain bacteria or rarely a virus (which includes HIV and Hepatitis) Blood from the community blood supply has been collected from volunteer donors who have been screened for health risk. The blood has been tested for major blood transmitted disease, but no transfusion is 100% safe. The blood is tested with very sensitive and accurate tests to screen for hepatitis, AIDS, and other disease, which makes the risks very small. You may have side effects from the transfusion (rash, fever, chills) or an allergic reaction The transfusion increases your risks of getting infection or cancer coming back The transfusion can increase the time you have to stay in the hospital The transfusion can potentially cause death if the wrong blood is given or your body rejects the blood Before blood is transfused, it is tested again to make sure it is the correct typeThere are other options than getting blood or blood products from other people and they include:   Drugs which can decrease bleeding Drugs which can cause your body to make more blood (used in elective procedures with advance notice) Autologous (your own blood) donation (needs pre-arrangement) No transfusionIf you exercise your right to refuse to be transfused with blood or blood products; these things listed below, among others, could happen to you: Your body may not get enough oxygen and suffer damage You may have a higher chance of bleeding You may limit other options for your condition You may die from losing too much blood

## 2023-11-15 NOTE — Progress Notes (Signed)
 Assumed care of patient 0700-1900. VSS throughout shift. Patient given PRN oxycodone  x1 and Dilaudid  x1 for R arm pain with positive effect. No further concerns at this time. Please refer to patient flowsheets and eMAR for further assessments and documentation. Duwaine DEL., RN

## 2023-11-15 NOTE — Progress Notes (Signed)
 Patient is scheduled for hemodialysis 1st shift tomorrow morning. Transporters will arrive for patient between 0700-0800. Please obtain an accurate weight prior to pick up, ensure the patient is ready for transport, and administer any pre-dialysis medications. Also, please make sure patient is on HoverMatt prior to coming to Dialysis. If patient is to receive MIDODRINE please ensure dose has been administered prior to the patient leaving for HD. Please consult pharmacy if you are unsure of medications that should be held prior to HD.     Please weigh patient daily for accurate fluid monitoring.    Thank you.

## 2023-11-15 NOTE — Progress Notes (Signed)
 Dialysis progress note 11/15/2023 2:26 PMSubjective:NAEO. Had dialysis yesterday with 1.6 L UF c/b cramping which resolved with uF off. Seen at bedside with family. Eating lunch. Offers no complaints.Objective:Vitals:  11/15/23 0054 11/15/23 0444 11/15/23 0744 11/15/23 1112 BP: 109/50 (!) 125/46 146/70 131/65 BP Location: Left leg Left leg Left leg Left leg Pulse: 83 76 81 84 Resp: 20 20 20 20  Temp: 36 C (96.8 F) 36.8 C (98.2 F) 35.5 C (95.9 F) 35.8 C (96.4 F) TempSrc: Temporal Temporal Temporal Temporal SpO2: 91% 91% 96% 95% Weight:     Height:     Intake/Output last 3 shifts:I/O last 3 completed shifts:09/04 0700 - 09/05 0659In: 700 (9 mL/kg) [I.V.:700 (0.4 mL/kg/hr)]Out: 3019 (38.9 mL/kg) [Other:3019]Net: -2319Weight: 77.6 kg Physical ExamGeneral: Awake, alert, NAD, eating lunchHEENT: NC/ATCardiovascular: RRRPulmonary: normal WOB on room airExt: no BLE edema noted, R arm in slingNeurological: answers questions appropriately, no focal deficitsAccess: HDTC, c/d/i Metabolic/Bone/NutritionRecent Labs Lab 09/05/250151 09/04/250625 09/03/252134 09/03/251959 Sodium 133 132*  --  135 Potassium 4.9 5.6*  --  5.3* Chloride 91* 87*  --  88* CO2 22 20  --  22 UN 29* 56*  --  48* Creatinine 4.36* 7.53*  --  6.66* Glucose 121* 82  --  92 Calcium  7.7* 7.9*  --  7.8* Albumin  --  3.2* 3.3*  --  Phosphorus 4.1 5.6*  --   --    Component Value Date/Time  PTH 309.0 (H) 10/17/2023 1644  Anemia Recent Labs Lab 09/05/250151 09/03/251959 WBC 10.5 10.9 Hemoglobin 10.1* 10.4* Hematocrit 33* 36* Platelets 428 337   Component Value Date/Time  FE 65 10/17/2023 1644  IBC 176 (L) 10/17/2023 1644  FESAT 37 10/17/2023 1644  FER 881 (H) 10/17/2023 1644  * Humerus LEFT standard AP and  Lateral viewsResult Date: 9/4/2025No acute osseous abnormality in the left humerus. Sequela of chronic rotator  cuff disease with bony irregularity of the greater tuberosity, as seen on prior imaging. Stable mild acromioclavicular arthrosis and mild glenohumeral arthrosis. END OF IMPRESSION I have personally reviewed the images and the Resident's/Fellow's interpretation and agree with or edited the findings. * Femur RIGHT standard AP and LateralResult Date: 9/4/2025Patchy areas of lucency in the proximal femur and sclerosis in the ischium, similar to prior. No pathologic fracture. No acute osseous findings. END OF IMPRESSION I have personally reviewed the images and the Resident's/Fellow's interpretation and agree with or edited the findings. * Femur LEFT standard AP and LateralResult Date: 9/4/2025Patchy sclerotic and lucent lesions in the proximal femur and ischium, grossly unchanged compared to prior radiographic imaging. No pathologic fracture. No acute osseous abnormality. END OF IMPRESSION I have personally reviewed the images and the Resident's/Fellow's interpretation and agree with or edited the findings. CT humerus RIGHT with contrastResult Date: 9/4/2025Slightly comminuted displaced and foreshortened oblique fracture through the proximal humeral diaphysis with posterior displacement. Associated adjacent soft tissue hematoma. Arthritic changes of the glenohumeral joint with subcortical cystic changes and possible erosions. Degenerative changes of the acromioclavicular joint prominent right axillary node measuring 1 cm, nonspecific, correlate clinically END OF IMPRESSION * Elbow RIGHT standard AP, Lateral, and both ObliquesResult Date: 9/4/2025No acute fracture or dislocation. END OF IMPRESSION I have personally reviewed the images and the Resident's/Fellow's interpretation and agree with or edited the findings. * Humerus RIGHT standard AP and  Lateral viewsResult Date: 9/3/2025Fracture of the proximal humeral shaft at the level of the deltoid tuberosity with lateral and  anterior displacement of the distal fragment as well as apex anterior angulation END OF IMPRESSION  I have personally reviewed the images and the Resident's/Fellow's interpretation and agree with or edited the findings. *Chest standard frontal and lateral viewsResult Date: 9/3/2025Increased pulmonary edema. Trace right and moderate to large left pleural fluid, increased compared to prior study, with associated atelectasis. Superimposed infection is not excluded. END OF IMPRESSION I have personally reviewed the images and the Resident's/Fellow's interpretation and agree with or edited the findings. EKG 12 leadResult Date: 9/3/2025Sinus rhythm Ventricular premature complex Low voltage, extremity and precordial leads Prolonged QT interval Poor quality data, interpretation may be affectedShoulder RIGHT 3 or more viewsResult Date: 9/3/2025Mildly displaced proximal humeral shaft fracture. Diffuse osseous sclerosis. Central catheter terminates at the cavoatrial junction. Right-sided pleural effusion. Patchy airspace opacities in the right lower lung zone. Possible left-sided pleural effusion. END OF IMPRESSION I have personally reviewed the images and the Resident's/Fellow's interpretation and agree with or edited the findings.  Assessment/PlanGreg Pearson is a 63 y.o. 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 Ortho being consulted. Nephrology consulted for ESRD and provision of dialysis. Hemodialysis Note1. ESR[facility] to be 2/2 [facility] acute [facility] for dialysis today. HD tomorrow with rx belowF180 Na 138 K 2 Ca 2.5 HCO3 35 Qb: 400 ml/min Qd: 1.5X Qb UF 1-2.5 L as hemodynamics tolerates Renally dose medications for CrCl  < 10 mL/minContinue NephroviteEDW 75 kgFollow chemistries Electrolytesacceptable Acid-BaseWill keep within desired goal range (serum bicarb 22-25) with RRT against 35 bicarb Anemia: Goal hemoglobin of 10-11 gm/dl.Hgb 10.1ESA management per Heme/Onc CKD-MBD: Hypocalcemia noted with hypoalbuminemia, corrected ok. If continues to trend down, would check iCa and keep > 3.5Restart home calcitriol  0.75 mcg qTTSPhosphorus acceptableContinue Sensipar  60 mg daily ( iPTH 759 on 10/24/23)  Hemodynamics/ Orthostatic hypotensionSBP range: 80's - 130'sUF with HD as ableOn Midodrine  10 mg TID + florinef  Access: HDTCNext HD: 9/6/25Thank you for allowing us  to participate in the care of this patientAuthor: Alan Contes, NP  as of: 11/15/2023  at: 2:26 PM

## 2023-11-15 NOTE — Progress Notes (Signed)
 Report Given  Diagnostic Imaging Nurse HandoffStudy: MRINeuro Status:Alert and Oriented x3, can follow commands and is consentableTelemetry:No - Patient is not currently on telemetry or cardiac monitoring  IV Access:Hemodialysis Catheter:  Tunneled Double lumen Left Internal Jugular (Active) Insertion Site Description Clean;Dry 11/14/23 1710 Phlebitis Scale Grade 0 11/14/23 1710 Infiltration Scale Grade 0 11/14/23 1710 Proximal Lumen Status Flushed;Heparin  locked;Capped 11/14/23 1710 Distal Lumen Status Flushed;Heparin  locked;Capped 11/14/23 1710 Dressing Type Transparent CHG 11/14/23 1710 Dressing Status Clean, dry, and intact 11/14/23 1710 Dressing Change New 11/14/23 1710 (T)Transparent Drsg Change- (Every 7 Days) Dressing changed 11/14/23 1710 Dressing Change Due 11/21/23 11/14/23 1710 Heparin /TPA Dwell Yes - Heparin  11/14/23 1710 Freq of line accesses and lab draws addressed? Yes 11/14/23 1710 Extravasation No 11/14/23 1710 Comments dressing c/d/i no s/s of infection 11/14/23 1710   Peripheral IV 11/14/23 0029 20 G Anterior;Left Foot (provider order) (Active) Phlebitis Scale Grade 0 11/15/23 0900 Infiltration Scale Grade 0 11/15/23 0900 Line Status Flushed;Capped 11/15/23 0900 Dressing Type Transparent 11/15/23 0900 Dressing Status Clean, dry, and intact 11/15/23 0900 Dressing Change Due 11/21/23 11/15/23 0900 Extravasation No 11/15/23 0900 Continuous Infusions:NoArterial line/transducer present:No.Additional lines and drains: NoneMedication Patches/Insulin  Pumps:NoPrecautions:None (Universal)Patient Readiness for MRI:: Patient dressed in MRI safe scrubs or gown Transport:BedHover Requested:YesRespiratory:Room AirSleep Apnea:NoClaustrophobic: NoPain Concerns:YesSedation/Pain/Medication Plan :Yes, patient plan to administer meds prior to scanAble to Lay Flat for the Duration of the Scan:YesCode status: Full  CodeChristine T Horace Wishon, RN Received Handoff Report from Megan,RN for MRI 4:45 PMProcedure Summary

## 2023-11-16 ENCOUNTER — Encounter: Admission: EM | Disposition: E | Payer: Self-pay | Source: Ambulatory Visit | Attending: Internal Medicine

## 2023-11-16 ENCOUNTER — Inpatient Hospital Stay

## 2023-11-16 DIAGNOSIS — I120 Hypertensive chronic kidney disease with stage 5 chronic kidney disease or end stage renal disease: Secondary | ICD-10-CM

## 2023-11-16 DIAGNOSIS — Z85528 Personal history of other malignant neoplasm of kidney: Secondary | ICD-10-CM

## 2023-11-16 DIAGNOSIS — M84551A Pathological fracture in neoplastic disease, right femur, initial encounter for fracture: Secondary | ICD-10-CM

## 2023-11-16 DIAGNOSIS — Z452 Encounter for adjustment and management of vascular access device: Secondary | ICD-10-CM

## 2023-11-16 DIAGNOSIS — Z859 Personal history of malignant neoplasm, unspecified: Secondary | ICD-10-CM

## 2023-11-16 DIAGNOSIS — M25551 Pain in right hip: Secondary | ICD-10-CM

## 2023-11-16 LAB — BASIC METABOLIC PANEL
Anion Gap: 21 — ABNORMAL HIGH (ref 7–16)
CO2: 23 mmol/L (ref 20–28)
Calcium: 7.6 mg/dL — ABNORMAL LOW (ref 8.6–10.2)
Chloride: 90 mmol/L — ABNORMAL LOW (ref 96–108)
Creatinine: 6.27 mg/dL — ABNORMAL HIGH (ref 0.67–1.17)
Glucose: 87 mg/dL (ref 60–99)
Lab: 52 mg/dL — ABNORMAL HIGH (ref 6–20)
Potassium: 4.9 mmol/L (ref 3.3–5.1)
Sodium: 134 mmol/L (ref 133–145)
eGFR BY CREAT: 9 — AB

## 2023-11-16 LAB — CBC
Hematocrit: 34 % — ABNORMAL LOW (ref 37–52)
Hematocrit: 34 % — ABNORMAL LOW (ref 37–52)
Hemoglobin: 10.1 g/dL — ABNORMAL LOW (ref 12.0–17.0)
Hemoglobin: 10.1 g/dL — ABNORMAL LOW (ref 12.0–17.0)
MCV: 90 fL (ref 75–100)
MCV: 91 fL (ref 75–100)
Platelets: 498 THOU/uL — ABNORMAL HIGH (ref 150–450)
Platelets: 530 THOU/uL — ABNORMAL HIGH (ref 150–450)
RBC: 3.7 MIL/uL — ABNORMAL LOW (ref 4.0–6.0)
RBC: 3.8 MIL/uL — ABNORMAL LOW (ref 4.0–6.0)
RDW: 23.1 % — ABNORMAL HIGH (ref 0.0–15.0)
RDW: 23.2 % — ABNORMAL HIGH (ref 0.0–15.0)
WBC: 11.2 THOU/uL — ABNORMAL HIGH (ref 3.5–11.0)
WBC: 11.2 THOU/uL — ABNORMAL HIGH (ref 3.5–11.0)

## 2023-11-16 LAB — MAGNESIUM: Magnesium: 1.8 mg/dL (ref 1.6–2.5)

## 2023-11-16 LAB — TYPE AND SCREEN
ABO RH Blood Type: A POS
Antibody Screen: NEGATIVE

## 2023-11-16 LAB — PROTIME-INR
INR: 1.1 (ref 0.9–1.1)
Protime: 12.5 s (ref 10.0–12.9)

## 2023-11-16 LAB — PHOSPHORUS: Phosphorus: 4.7 mg/dL — ABNORMAL HIGH (ref 2.7–4.5)

## 2023-11-16 SURGERY — INSERTION, INTRAMEDULLARY ROD, FEMUR
Anesthesia: General | Site: Leg Upper | Laterality: Right | Wound class: Clean

## 2023-11-16 MED ORDER — ATORVASTATIN CALCIUM 40 MG PO TABS *I*
ORAL_TABLET | ORAL | Status: AC
Start: 2023-11-16 — End: 2023-11-16
  Filled 2023-11-16: qty 1

## 2023-11-16 MED ORDER — FENTANYL CITRATE 50 MCG/ML IJ SOLN *WRAPPED*
INTRAMUSCULAR | Status: DC | PRN
Start: 2023-11-16 — End: 2023-11-16
  Administered 2023-11-16: 100 ug via INTRAVENOUS

## 2023-11-16 MED ORDER — SUGAMMADEX SODIUM 100 MG/1ML IV SOLN *WRAPPED*
INTRAVENOUS | Status: AC
Start: 2023-11-16 — End: 2023-11-16
  Filled 2023-11-16: qty 1

## 2023-11-16 MED ORDER — OXYCODONE HCL 5 MG/5ML PO SOLN *I*
ORAL | Status: AC
Start: 2023-11-16 — End: 2023-11-16
  Filled 2023-11-16: qty 10

## 2023-11-16 MED ORDER — ACETAMINOPHEN 500 MG PO TABS *I*
ORAL_TABLET | ORAL | Status: AC
Start: 2023-11-16 — End: 2023-11-16
  Filled 2023-11-16: qty 2

## 2023-11-16 MED ORDER — ROCURONIUM BROMIDE 10 MG/ML IV SOLN *WRAPPED*
Status: DC | PRN
Start: 2023-11-16 — End: 2023-11-16
  Administered 2023-11-16: 20 mg via INTRAVENOUS
  Administered 2023-11-16: 50 mg via INTRAVENOUS

## 2023-11-16 MED ORDER — MIDODRINE HCL 5 MG PO TABS *I*
ORAL_TABLET | ORAL | Status: AC
Start: 2023-11-16 — End: 2023-11-16
  Filled 2023-11-16: qty 2

## 2023-11-16 MED ORDER — ACETAMINOPHEN 10 MG/ML IV SOLN *I*
INTRAVENOUS | Status: AC
Start: 2023-11-16 — End: 2023-11-16
  Filled 2023-11-16: qty 100

## 2023-11-16 MED ORDER — MIDAZOLAM HCL 1 MG/ML IJ SOLN *I* WRAPPED
INTRAMUSCULAR | Status: AC
Start: 2023-11-16 — End: 2023-11-16
  Filled 2023-11-16: qty 2

## 2023-11-16 MED ORDER — DEXAMETHASONE SODIUM PHOSPHATE 4 MG/ML INJ SOLN *WRAPPED*
INTRAMUSCULAR | Status: DC | PRN
Start: 2023-11-16 — End: 2023-11-16
  Administered 2023-11-16: 4 mg via INTRAVENOUS

## 2023-11-16 MED ORDER — LIDOCAINE HCL 2 % IJ SOLN *I*
INTRAMUSCULAR | Status: DC | PRN
Start: 2023-11-16 — End: 2023-11-16
  Administered 2023-11-16: 60 mg via INTRAVENOUS

## 2023-11-16 MED ORDER — CEFAZOLIN 1000 MG IN STERILE WATER 10ML SYRINGE *I*
PREFILLED_SYRINGE | INTRAVENOUS | Status: DC | PRN
  Administered 2023-11-16: 2000 mg via INTRAVENOUS

## 2023-11-16 MED ORDER — OXYCODONE HCL 5 MG PO TABS *I*
ORAL_TABLET | ORAL | Status: AC
Start: 2023-11-16 — End: 2023-11-16
  Filled 2023-11-16: qty 2

## 2023-11-16 MED ORDER — HYDROMORPHONE HCL PF 1 MG/ML IJ SOLN *WRAPPED*
0.2500 mg | INTRAMUSCULAR | Status: AC | PRN
Start: 2023-11-16 — End: 2023-11-16

## 2023-11-16 MED ORDER — ACETAMINOPHEN 10 MG/ML IV SOLN *I*
1000.0000 mg | Freq: Once | INTRAVENOUS | Status: AC
Start: 2023-11-16 — End: 2023-11-17
  Administered 2023-11-16: 1000 mg via INTRAVENOUS

## 2023-11-16 MED ORDER — ONDANSETRON HCL 2 MG/ML IV SOLN *I*
1.0000 mg | Freq: Once | INTRAMUSCULAR | Status: AC | PRN
Start: 2023-11-16 — End: 2023-11-16

## 2023-11-16 MED ORDER — ETOMIDATE 2 MG/ML IV SOLN *I*
INTRAVENOUS | Status: DC | PRN
Start: 2023-11-16 — End: 2023-11-16
  Administered 2023-11-16: 16 mg via INTRAVENOUS

## 2023-11-16 MED ORDER — SODIUM CHLORIDE 0.9 % IV SOLN WRAPPED *I*
Status: DC | PRN
Start: 2023-11-16 — End: 2023-11-16

## 2023-11-16 MED ORDER — DEXAMETHASONE 4 MG PO TABS *I*
ORAL_TABLET | ORAL | Status: AC
Start: 2023-11-16 — End: 2023-11-16
  Filled 2023-11-16: qty 1

## 2023-11-16 MED ORDER — HYDROMORPHONE HCL PF 1 MG/ML IJ SOLN *WRAPPED*
0.5000 mg | INTRAMUSCULAR | Status: AC | PRN
Start: 2023-11-16 — End: 2023-11-16

## 2023-11-16 MED ORDER — ONDANSETRON HCL 2 MG/ML IV SOLN *I*
INTRAMUSCULAR | Status: DC | PRN
Start: 2023-11-16 — End: 2023-11-16
  Administered 2023-11-16: 4 mg via INTRAVENOUS

## 2023-11-16 MED ORDER — VASOPRESSIN 2 UNITS/2ML (1UNIT/ML) IN NS SYRINGE *I*
INTRAVENOUS | Status: DC | PRN
Start: 2023-11-16 — End: 2023-11-16
  Administered 2023-11-16 (×2): .5 [IU] via INTRAVENOUS
  Administered 2023-11-16: 1 [IU] via INTRAVENOUS
  Administered 2023-11-16: .5 [IU] via INTRAVENOUS
  Administered 2023-11-16: 1 [IU] via INTRAVENOUS
  Administered 2023-11-16: .5 [IU] via INTRAVENOUS

## 2023-11-16 MED ORDER — HYDROMORPHONE HCL PF 1 MG/ML IJ SOLN *WRAPPED*
INTRAMUSCULAR | Status: AC
Start: 2023-11-16 — End: 2023-11-16
  Filled 2023-11-16: qty 0.5

## 2023-11-16 MED ORDER — FENTANYL CITRATE 50 MCG/ML IJ SOLN *WRAPPED*
INTRAMUSCULAR | Status: AC
Start: 2023-11-16 — End: 2023-11-16
  Filled 2023-11-16: qty 2

## 2023-11-16 MED ORDER — SUGAMMADEX SODIUM 100 MG/1ML IV SOLN *WRAPPED*
INTRAVENOUS | Status: DC | PRN
Start: 2023-11-16 — End: 2023-11-16
  Administered 2023-11-16: 180 mg via INTRAVENOUS
  Administered 2023-11-16: 100 mg via INTRAVENOUS

## 2023-11-16 MED ORDER — PHENYLEPHRINE 100 MCG/ML IN NS 10 ML *WRAPPED*
INTRAMUSCULAR | Status: DC | PRN
Start: 2023-11-16 — End: 2023-11-16
  Administered 2023-11-16 (×6): 100 ug via INTRAVENOUS

## 2023-11-16 MED ORDER — TRANEXAMIC ACID 1000 MG/10ML IV SOLN *I*
INTRAVENOUS | Status: DC | PRN
Start: 2023-11-16 — End: 2023-11-16
  Administered 2023-11-16: 1000 mg via INTRAVENOUS

## 2023-11-16 MED ORDER — CEFAZOLIN 1000 MG IN STERILE WATER 10ML SYRINGE *I*
1000.0000 mg | PREFILLED_SYRINGE | INTRAVENOUS | Status: DC
Start: 2023-11-17 — End: 2023-11-19
  Filled 2023-11-16: qty 10

## 2023-11-16 MED ORDER — EPHEDRINE 5MG/ML IN NS IV/IJ *WRAPPED*
INTRAMUSCULAR | Status: DC | PRN
Start: 2023-11-16 — End: 2023-11-16
  Administered 2023-11-16: 10 mg via INTRAVENOUS
  Administered 2023-11-16 (×2): 5 mg via INTRAVENOUS
  Administered 2023-11-16: 10 mg via INTRAVENOUS

## 2023-11-16 MED ORDER — HALOPERIDOL LACTATE 5 MG/ML IJ SOLN *I*
0.5000 mg | Freq: Once | INTRAMUSCULAR | Status: AC | PRN
Start: 2023-11-16 — End: 2023-11-16

## 2023-11-16 MED ORDER — ACETAMINOPHEN 325 MG PO TABS *I*
325.0000 mg | ORAL_TABLET | ORAL | Status: DC | PRN
Start: 2023-11-16 — End: 2023-11-16

## 2023-11-16 SURGICAL SUPPLY — 25 items
4.2 short drill bit (Bit) ×1 IMPLANT
ADHESIVE SKIN CLOSURE 0.7ML DERMABOND ADVANCED (Supply) ×2 IMPLANT
APPLICATOR CHLORAPREP STER  ORANGE 26ML (Supply) ×2 IMPLANT
COVER HANDLE FOR STERIS LIGHTING STERILE (Supply) ×3 IMPLANT
DRAPE C-ARM 42 IN X 120 IN (Supply) ×1 IMPLANT
DRAPE SURG IOBAN 2 ISOLATION LG (Drape) IMPLANT
DRESSING MEPILEX POST OP 4X10 (Dressing) IMPLANT
DRESSING MEPILEX POST OP 4X6 (Dressing) ×3 IMPLANT
DRESSING MEPILEX POST OP 4X8 (Dressing) IMPLANT
FILTER NEPTUNE 4PORT MANIFOLD (Supply) ×1 IMPLANT
GLOVE BIOGEL PI INDICATOR SZ 7.5 LF (Glove) ×1 IMPLANT
GLOVE BIOGEL PI MICRO SZ 7.5 (Glove) ×2 IMPLANT
GUIDEWIRE 3.2 X 400MM (Guidewire) ×2 IMPLANT
LOCK SCREW FOR MEDULLA NAIL 5 MM L42 XL25 (Screw) ×1 IMPLANT
NAIL TFNA CANN TI 130DEG 11X380MM RT STER (Nail) ×1 IMPLANT
PACK CUSTOM ORTHO EXTREMITY CDS (Pack) ×1 IMPLANT
PACK TOWEL LIGHT BLUE STERILE (Towel) ×1 IMPLANT
ROD REAM BALL TIP 2.5 (Supply) ×1 IMPLANT
SCREW HELICAL TFNA FEN 90MM STER (Screw) ×1 IMPLANT
SLEEVE COMP KNEE HI MED (Supply) ×1 IMPLANT
SOL SODIUM CHLORIDE IRRIG 1000ML BTL (Solution) ×1 IMPLANT
STAPLER SKIN WIDE STPL LEG L3.9MM WIRE DIA0.58MM FIX HD PROX (Supply) IMPLANT
SUTR MONOCRYL 2-0 SH27IN UNDY (Suture) ×2 IMPLANT
SUTR MONOCRYL PLUS 3-0PS2 27IN UNDY (Suture) ×2 IMPLANT
SUTR VICRYL ANTIB 1 CT-1 POP 18 VIOLET (Suture) ×1 IMPLANT

## 2023-11-16 NOTE — Op Note (Signed)
 Operative Note (Surgical Case/Log ID: [id number])   Date of Surgery: 11/16/2023   Surgeons: Surgeons and Role:   * Jerrye Lenis, MD - Primary   * Alise Bonnet, MD - Resident - Assisting   * Josh, Dorn Searing, MD - Resident - Assisting Assistants:     Pre-op Diagnosis: Right femur proximal shaft impending pathologic fracture   Post-op Diagnosis: Right femur proximal shaft impending pathologic fracture   Procedure(s) Performed: Right femur cephalomedullary nail for prophylactic fixation   Anesthesia Type: General       Fluid Totals: No intake/output data recorded.   Estimated Blood Loss: * No values recorded between 11/16/2023  1:28 PM and 11/16/2023  3:20 PM *   Specimens to Pathology:  ID Type Source Tests Collected by Time Destination A : right femur bone reamings TISSUE Bone SURGICAL PATHOLOGY Edwin Greig HERO, RN 11/16/2023 1449     Temporary Implants: Implant: Palindrome Precision Chronic Catheter Sport Pack-12/17/2013. Anticipated Removal Date:.    Packing:           Patient Condition: good   Indications: Patient with a history of metastatic cancer.  Due to increasing pain and concern for pathologic impending fracture, we wish to proceed with operative intervention for stabilization prophylactically.  Plan for right femur today.  Will require multiple return to operating room.   Findings (Including unexpected complications): Not unexpected. Description of Procedure: Patient in the preoperative holding area by Dr. Memphis Decoteau.  Consent was verified and surgical site was marked.  Patient was met by the anesthesia team and brought to the operating room.  Anesthesia was induced on the hospital bed and the patient was transferred over to the operating room table.  The patient was appropriately positioned on the traction table with all bony prominences padded.  The nonoperative leg was positioned out of the way and arms were crossed across the chest to  remove them from the operative field.  The right leg was positioned in traction with some slight internal rotation.  Imaging was then brought in preoperatively to assess for appropriate reduction and visualization of fluoroscopy.  Position was dialed in in a closed fashion and this was verified in AP and lateral views.  Imaging was then backed out and the patient was prepped and draped in usual sterile fashion.A surgical timeout was held verifying the correct patient, procedure, surgical site, preoperative antibiotics, intraoperative imaging, implant availability.  The greater trochanter was palpated and an incision was made just proximal to the greater troch in line with the femur.  This incision was carried sharply down through subcutaneous tissue and through the fascia.  At this point a finger was used to palpate the tip of the greater trochanter.  A guidewire was placed by hand on the tip of the trochanter and its location was checked on AP and lateral fluoroscopy.  Once satisfied with guidewire location this was gently seated with a mallet and then driven in with a wire driver.Over the guidewire the opening reamer was then used to create an insertion point in the tip of the greater trochanter.  The ball tip guide wire was inserted and verified to be center-center at the knee. Using fluoroscopy at the hip the nail was measured.  The nail measured . We then sequentially reamed to 12.48mm.  The reamings were then sent for pathology.  The long  cephalomedullary nail was assembled and then inserted by hand.  Lag screw alignment was verified after assembly.  Its location was verified on fluoroscopy to  be appropriate on AP and lateral views after insertion.  We then turned our attention to the lag screw placement.  The triple sleeve was inserted and we created our distal incision down to bone and this was verified on fluoroscopy.  The guidewire was inserted on power into the femoral head.  Its location was  modified until appropriate center center location of the guidewire tip was achieved.  This was verified on fluoroscopy both AP and lateral views.  Once the guidewire was appropriately seated the measuring guide was used to obtain appropriate length measurement. The appropriate 90 mm lag screw was selected and the lateral wall opening reamer was used.  The cannulated drill was set to the appropriate size which was verified and then drilled to the appropriate depth.  Care was taken to not dislodge the guidewire.  The lag screw was then inserted on hand with care taken to achieve appropriate alignment of the angled base.  We verified appropriate alignment of the cephaladly nail and the proximal interlocking screw.We then turned our attention to the distal interlocking screws.  Perfect circles technique was used with a lateral view of the knee.  We chose to place 1 interlocking screw.  Incision was localized on fluoroscopy using a knife.  This was carried sharply through skin and subcu tissue.  Incision was made down to the fascia.  A clamp was used to spread soft tissue down to the bone.  The drill point was localized on the perfect circle view to be centered in the interlocking screw hole.  The drill was then used and achieved bicortical drilling through the femur.  Screw was measured off the drill and then verified with depth gauge.  The screw was then inserted on hand and found to have excellent fixation.  We then obtained final fluoroscopic films including AP and lateral views at the knee and at the hip.  Satisfied with our reduction and implant fixation we turned our attention to closure.The wounds were copiously irrigated with normal saline.  The deep fascia was closed with #1 Vicryl followed by inverted 2-0 Monocryl in the deep dermal layer and Monocryl with Dermabond in the skin.  All counts were correct at the end of the case.  Dressing of Aquacel Ag was placed.  The patient was disconnected from the  traction table.  Patient was woke from anesthesia without complication.  Patient was transferred back to his hospital bed.  No complications were encountered.Weight-bear as tolerated on the right lower extremity.  Will return to operating room this week with Dr. Debera for humerus fixation.  Will require further return for left femur prophylactic fixation as well.Signed:  Olie Dibert, MD  on 11/16/2023 at 3:20 PM

## 2023-11-16 NOTE — Anesthesia Preprocedure Evaluation (Addendum)
 Anesthesia Pre-operative History and Physical for Shane LytleHighlighted Issues for this Procedure:63 y.o. male with hx ESRD on dialysis, metastatic cancer to spine and femur, hypotension, hypothyroid, CHF, GERD,  R shoulder and R Femur fracture [S72.90XA] presenting for Procedure(s) (LRB):(R) FEMUR CMN (Right) by Surgeon(s):Ciufo, Alm, MD scheduled for 210 minutes.BMI Readings from Last 1 Encounters:11/14/23 : 25.25 kg/mStress Test/Echocardiography:Normal stress echo 2012.SABRAAnesthesia Evaluation Information Source: patient, records    PULMONARYPertinent(-):  No smoking, asthma, shortness of breath, recent URI or cough/congestionCARDIOVASCULARPoor<4 METS Exercise TolerancePertinent(-):  No hypertension or anginaGI/HEPATIC/RENAL   + GERD  + Renal Issues        hemodialysis NEURO/PSYCH/ORTHOPertinent(-):  No syncope, cerebrovascular event or peripheral nerve issueENDO/OTHER  + Thyroid  DiseaseHEMATOLOGIC  + Blood Transfusion  + Arthritis Physical ExamAirway          Mouth opening: normal          Mallampati: III          Neck ROM: limited Cardiovascular         Rhythm: regular         Rate: normalNo murmur Pulmonary   breath sounds clear to auscultation  No cough ________________________________________________________________________PLANASA Score  4Anesthetic Plan general Induction (routine IV) General Anesthesia/Sedation Maintenance Plan (inhaled agents);  Airway Manipulation (direct laryngoscopy); Airway (cuffed ETT); Line ( use current access); Monitoring (standard ASA); Positioning (supine); PONV Plan (dexamethasone  and ondansetron ); Pain (per surgical team); PostOp (PACU)Standard AttestationInformed Consent   Risks:        Risks discussed were commensurate with the plan listed above with the following specific points: N/V, aspiration and sore throat, Damage to: eyes,  nerves and teeth, allergic Rx and unexpected serious injury.  Anesthetic Consent:       Anesthetic plan (and risks as noted above) were discussed with patient  Plan also discussed with team members including:     CRNA and attendingResponsible Anesthesia Provider Attestation:I attest that the patient or proxy understands and accepts the risks and benefits of the anesthesia plan. I also attest that I have personally performed a pre-anesthetic examination and evaluation, and prescribed the anesthetic plan for this particular location within 48 hours prior to the anesthetic as documented. Jimmie Rueter [name], MD  11/16/23, 1:27 PM

## 2023-11-16 NOTE — INTERIM OP NOTE (Addendum)
 Procedure: R prophylactic CMN femurPlanned return to operating room: Yes tentative for Tuesday for R Humerus with ortho oncologyAntibiotic regimen:  periop ancef  2 dosesPain Management: MMDVT prophylaxis and duration: per primary rec LovenoxWeight bearing status and activity restrictions: WBAT RLE, WBAT LLE W/ walker, NWB RUE in sling Wound/Pin Care: POD7 Mepilex change RLEPostoperative x-rays and radiographic studies: NoneDiet: ADAT Foley:  N.a Additional Specifics (as needed):  F.u reaming pathologyAnticipated  follow-up date: 3 weeks for right hipInterim Op Note (Surgical Log ID: [id number])   Date of Surgery: 11/16/2023   Surgeons: Surgeons and Role:   * Jerrye Lenis, MD - Primary   * Alise Bonnet, MD - Resident - Assisting   * Josh, Dorn Searing, MD - Resident - Assisting Assistants:     Pre-op Diagnosis: Pre-Op Diagnosis Codes:    * Femur fracture [S72.90XA]   Post-op Diagnosis: Post-Op Diagnosis Codes:   * Femur fracture [S72.90XA]   Procedure(s) Performed: Procedure(s) (LRB):RIGHT CMN (Right)   Anesthesia Type: General      Fluid Totals: I/O this shift:09/06 1500 - 09/06 2259In: - (0 mL/kg) Out: 150 (1.9 mL/kg) [Blood:150]Net: -150Weight: 77.6 kg    Estimated Blood Loss: 150 mL   Specimens to Pathology:  ID Type Source Tests Collected by Time Destination A : right femur bone reamings TISSUE Bone SURGICAL PATHOLOGY Edwin Greig HERO, RN 11/16/2023 1449     Temporary Implants: Implant: Palindrome Precision Chronic Catheter Sport Pack-12/17/2013. Anticipated Removal Date:.    Packing:           Patient Condition: good   Findings (Including unexpected complications): See op note Signed:  Bonnet Alise, MD  on 11/16/2023 at 3:40 PM

## 2023-11-16 NOTE — Progress Notes (Addendum)
 Hematology Oncology Hospitalist Progress Note  LOS: 3 days  Full Code Significant Events/Subjective: - No acute changes in health status overnight.-I saw [name] in hemodialysis this morning.  He reported feeling tired and having a dry mouth.  I reminded Mr. Shane Pearson that he could not have anything to drink as he is planned for surgery after HD.-Mr. Shane Pearson reported that pain control from his right humerus fracture is adequate.Review of Systems Respiratory:  Negative for shortness of breath.  Cardiovascular:  Negative for chest pain and palpitations. Gastrointestinal:  Negative for abdominal pain and nausea. Objective: Physical ExamTemp:  [35.1 C (95.2 F)-36.4 C (97.5 F)] 35.1 C (95.2 F)Heart Rate:  [69-93] 69Resp:  [18-20] 18BP: (102-155)/(54-90) 114/69 Physical ExamVitals reviewed. Constitutional:     General: He is not in acute distress.Eyes:    Extraocular Movements: Extraocular movements intact. Cardiovascular:    Rate and Rhythm: Normal rate and regular rhythm.    Heart sounds: Normal heart sounds. Pulmonary:    Effort: Pulmonary effort is normal. No tachypnea.    Breath sounds: No decreased breath sounds, wheezing, rhonchi or rales. Abdominal:    General: Abdomen is flat. Bowel sounds are normal.    Palpations: Abdomen is soft.    Tenderness: There is no abdominal tenderness. Musculoskeletal:    Right upper arm: Swelling (RUE held in sling) present. No deformity.    Right lower leg: No edema.    Left lower leg: No edema. Neurological:    Mental Status: He is alert.    Motor: Motor function is intact. No atrophy. Recent Lab Studies:Personally reviewed and notable qnm:[facility] Labs Lab 09/06/250820 09/05/250151 09/03/251959 WBC 11.2*  11.2* 10.5 10.9 Hemoglobin 10.1*  10.1* 10.1* 10.4* Hematocrit 34*  34* 33* 36* Platelets 498*  530* 428 337  Recent Labs Lab 09/06/250820  09/05/250151 09/04/250625 09/03/252134 09/03/251959 Sodium  --  133 132*  --  135 Potassium  --  4.9 5.6*  --  5.3* Chloride  --  91* 87*  --  88* CO2  --  22 20  --  22 UN  --  29* 56*  --  48* Creatinine  --  4.36* 7.53*  --  6.66* Glucose  --  121* 82  --  92 Calcium   --  7.7* 7.9*  --  7.8* Albumin  --   --  3.2* 3.3*  --  Phosphorus 4.7* 4.1 5.6*  --   --  Recent Labs Lab 09/06/250820 09/03/252134 INR 1.1 1.2* Protime 12.5 13.3* PATHOLOGY:Paraspinal muscle biopsy (8/29): -Metastatic carcinoma involving skeletal muscle.  COMMENT: The specimen is compared to the prior biopsy ([id number]) and is similar in morphology. Immunohistochemistry shows that the tumor cells are positive for pancytokeratin and PAX8, with focal CAIX positivity, while negative for GATA3, CK7, and CK20. These findings are most consistent with a renal primary. Intradepartmental consultation was obtained ([facility] pathology). Recent Imaging Studies:Personally reviewed and notable for:MR head without and with contrastResult Date: 9/6/2025Decreased sensitivity/specificity secondary to significant patient motion. Overall, interval progression of the intracranial metastatic disease with new/growing metastatic lesions, increasing leptomeningeal enhancement, and multifocal cranial nerve enhancement. New subtle FLAIR hyperintensity and diffusion restriction within the splenium of the corpus callosum, probably representing a cytotoxic lesion of the corpus callosum. Stable patchy signal/enhancement within the calvarium. Findings are nonspecific and could be related to known renal osteodystrophy or metastatic disease. Attention on follow-up imaging is recommended. END OF IMPRESSION Current Inpatient Medications: atorvastatin   20 mg Oral Daily with dinner  B complex-vitamin C-folic acid   1 tablet  Oral Daily  cinacalcet   60 mg Oral QPM  dexAMETHasone   2 mg Oral BID WC   fludrocortisone   100 mcg Oral TID  fluticasone   1 spray Nasal Daily  magnesium  oxide  400 mg Oral Daily  midodrine   10 mg Oral TID  pantoprazole   40 mg Oral QAM  senna  1 tablet Oral Nightly  acetaminophen   1,000 mg Oral TID DiphenhydrAMINE -Lidocaine -Maalox (BMX/First Mouthwash), sodium chloride , dextrose , melatonin, polyethylene glycol, heparin  (porcine), heparin  (porcine), oxyCODONE  **OR** oxyCODONE , sodium chloride , dextrose , naloxoneAssessment: Shane Pearson is a 63 year old man with ESRD on HD, RCC with intracranial, 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  Plans: ONC: Stage IV carcinoma, renal cell:-Tissue origin confirmed on biopsy of 8/29-Followed outpatient by Dr. Bronson: Not yet started on systemic therapy-MR head w/wo contrast (9/5) showing progression on intracranial metastatic disease: -Radiation oncology consulted for consideration of provision of brain XRT; plan to re-engage on 9/8 -S/p 5 fractions of radiation to the cervical spine 9/3-Continue Decadron  2mg  BID for neurologic symptoms-Orthopedic surgery consulting: -Plan for staged prophylactic IMN to femurs B/L: Patient is optimized for surgery on the right femur today.-Timing of IMN of left femur TBD Mechanical fall resulting in R spiral humeral shaft fracture, concern for pathologic fx -In sling, NWB-Orthopedic surgery following: -Plan for IR embolization on 9/8 followed by fixation, planned tentatively for 9/9-Pain control            -Tylenol  1g TID             -OxyIR 5/10 mg q4h PRN            -Decadron  as above  Hypotension, chronic:-Continue midodrine  10 mg TID-Florinef  100 mcg TID Hyperlipidemia:-Holding home aspirin  -Home atorvastatin  20 mg daily ESRD on HD  TThSa:-Nephro-Vite daily-Cinacalcet  60 mg daily-Nephrology following for provision of HD -HD today per the patient's usual schedule GERD:-Pantoprazole  40mg  daily F: OralE: BMP, Mg2+, phosphorus dailyN: NPO for ORDVT PPx: On holdCode Status: Full CodeDischarge Planning: (EDD):  9/12Discharge Criteria/Barriers to Discharge: Multiple surgical procedures plannedPT and/or OT Recommendations/Discharge to: TBDAppointments Needed with: Orthopedic surgeryPlan of care discussed with oncology PA Dena Lyons, orthopedic surgery team and bedside RN.Lamar MYRTIS Reap, MD, MPHAttending Physician, [facility] [phone] News Diagnoses

## 2023-11-16 NOTE — Progress Notes (Signed)
 Assumed care of patient from 1900-0700. VSS throughout shift. Pt had no c/o pain during shift. No further concerns at this time. Call bell within reach, supportive spouse at bedside overnight. Please refer to patient flowsheets and eMAR for further assessments and documentation. Report given to oncoming nurse for continuing care of pt.Trany Chernick K., RN

## 2023-11-16 NOTE — Anesthesia Postprocedure Evaluation (Signed)
 Anesthesia Post-Op NotePatient: [name] LytleProcedure(s) Performed:Procedure SummaryDate:11/16/2023 Anesthesia Start: 11/16/2023  1:28 PM Anesthesia Stop: 11/16/2023  4:04 PM Room / Location:S_OR_01 / [facility] MAIN OR Procedure(s):RIGHT CMN Diagnosis:Femur fracture [S72.90XA] Surgeon(s):Ciufo, Alm, MDUmelo, Dorn Searing, MDShaikh, Herb, MD Responsible Anesthesia Provider:Karley Pho, OZELL SQUIBB, MD Recovery VitalsBP: (!) 82/52 (11/16/2023  5:15 PM)Heart Rate: 90 (11/16/2023  5:15 PM)Heart Rate (via Pulse Ox): 91 (11/16/2023  5:15 PM)Resp: 22 (11/16/2023  5:15 PM)Temp: 36.5 C (97.7 F) (11/16/2023  5:00 PM)SpO2: 94 % (11/16/2023  5:15 PM)O2 Flow Rate: 2 L/min (11/16/2023  5:15 PM)0-10  Pain Scale: 7 (11/16/2023  4:45 PM)Anesthesia type:generalComplications Noted During Procedure or in PACU:None Comment:  Patient Location:PACULevel of Consciousness:  Awake and responds to stimulationPatient Participation:   Able to participateTemperature Status:  NormothermicOxygen Saturation:  Within patient's normal rangeCardiac Status: stable and appropriate for conditionFluid Status:  Stable and euvolemicAirway Patency:   YesPulmonary Status:  BaselinePain Management:  Adequate analgesiaNausea and Vomiting:None  Post Op Assessment:  Tolerated procedure well and no evidence of recallResponsible Anesthesia Provider Attestation:All indicated post anesthesia care provided -

## 2023-11-16 NOTE — Progress Notes (Signed)
 Report Given ToPre-op NursePt tolerated 4hr treatment with no complications or complaintsRemoved 2.3Liters of fluidHDTC DCIPt stable post tx Descriptive Sentence / Reason for Admission Duration of Treatment (minutes): 240Complications: unable to administer heparin  bolus/infusion, saline flushes during treatment to maintain patency of system with positive effectActive Issues / Relevant Events Review of Medications administered such as routine meds, antibiotics, and heparin : Heparin  dwellsVital Signs: VSS, see flowsheet for details.To Do ListMedications still needing administration: None r/t dialysis.Dressing change due: 9/11/2025Anticipatory Guidance / Discharge PlanningDate/Time of Next Treatment: TBD by NephrologyPlease weigh pt. daily for accurate fluid removal and monitoring. Thank you!Suzen Rummer, RN

## 2023-11-16 NOTE — Anesthesia Case Conclusion (Signed)
 CASE CONCLUSIONEmergenceActions:  Suctioned, soft bite block and extubatedCriteria Used for Airway Removal:  Adequate Tv & RR, acceptable O2 saturation, 5 sec head lift, sustained tetany and following commandsAssessment:  RoutineTransportDirectly to: PACUAirway:  Nasal cannulaOxygen Delivery:  2 lpmPosition:  UprightPatient Condition on HandoffLevel of Consciousness:  Alert/talking/calmPatient Condition:  StableHandoff Report to:  RNComments: Patient moving air well after extubation, oral mucosa and dentition intact as it was pre-op. In PACU, patient hypotensive like he was pre-operatively. Fluids opened, patient AOx3. Report to RN, questions answered, care transferred

## 2023-11-16 NOTE — Progress Notes (Addendum)
 Orthopaedic Surgery Progress Note for 9/6/2025Patient:Shane LytleMRN: [id number] DOA: 9/3/2025Subjective:NAE. VSS. Pain controlled on current regimen. No other complaints at this time. NPO for OR today. Awaiting dialysis, tentative 1st shift this AM. Pre op labs ordered, pending.Objective:BP: (117-155)/(46-90) Temp:  [35.5 C (95.9 F)-36.8 C (98.2 F)] Temp src: Temporal (09/06 0045)Heart Rate:  [76-93] Resp:  [20] SpO2:  [91 %-97 %] Physical Exam:General: Alert, no acute distress, supine in bed. Breathing comfortably on RA.RLE: Motor intact active knee flexion/extension, ankle dorsiflexion/plantarflexion, great toe flexion/extension. Sensation intact to light touch 1st dorsal web space/medial/lateral/plantar/dorsal foot. 2+ DP pulse, toes warm and well-perfused, < 3 second capillary refill.Laboratory studies:Recent Results (from the past 24 hours) EKG 12 lead  Collection Time: 11/15/23  5:29 PM Result Value  Rate 89  PR 156  P 50  QRSD 82  QT 354  QTc 431  QRS 28  T 47 Imaging:ReviewedA/P: Shane Pearson is a 63 y.o. male with PMH of ESRD on dialysis (T/T/S), metastatic carcinoma confirmed on biopsy presumed renal cell origin , admitted on 11/13/23 with R presumed pathologic humerus fx. Also found to have other bony lesions including of the bilateral femurs.WSOR today for R femur prophylactic CMNOptimization per primaryNeeds HD this AMPre op labs ordered, to be drawn during HDConsent signed and in chart EKG completePain control MMDiet NPO time specified Xrays reviewedWeight-bearing status: NWB RUE, NWB BLE ice/elevate PT/OT & OOB  Dvt ppx: per primaryDispo: pending OR Prentice Coy, MDOrthopaedic Surgery9/08/2023, 4:32 AMI saw and evaluated the patient. I agree with the resident's/fellow's findings and plan of care as documented. Details of my evaluation are as follows: Discussed  surgical risks and benefits. Patient understands risks of bleeding, infection, non/malunion, implant irritation, need for further surgery, blood clots, anesthesia risks versus benefits of surgical intervention.  They would like to proceed with surgery today.R femur CMN today, dialysis completedDavid Wilberth Damon, MD 11/16/2023 1:20 PM Department of Orthopaedic Surgery

## 2023-11-16 NOTE — Interdisciplinary Rounds (Addendum)
 Interdisciplinary Discharge Communication Note9/15/2025Targeted Discharge Disposition : HomeRounding was performed on: IDR Date: 11/16/23 at: IDR Time: 1023Interdisciplinary Rounding Participants:  Admit Date/Time:  11/13/2023  7:12 PM Principal Problem:  Other closed displaced fracture of proximal end of right humerus, initial encounterThe patient's problem list and interdisciplinary care plan was reviewed.EDD: Rounding Updates:9/6: dialysis today. Plan for surgery to right femur 9/9: hypotensive overnight, plan for CT C/A/P. Not optimized for surgery  Discharge PlanningTargeted Disposition Targeted Discharge Disposition : HomeHome Care    IV Infusion    Medical Equipment NeedsCurrent Home Equipment: Adjustable bed, Grab bars in shower/tub, Grab bars around toilet, Shower seat (built in), Environmental consultant (rolling), Environmental consultant (rollator), Wheelchair (manual), Cane (straight) (Spouse also reports they are going to install a ramp)                   Communication    Current PCP:  Juliene Debby PARAS, MDScheduled Future Appointments:  Future Appointments  Thursday November 28, 2023 11:00 AMTelehome Visit with Delon Bourgeois, PAWil[facility] [place] (--)Arrive at: [facility] [phone] Wednesday December 04, 2023 2:45 PMFOLLOW UP VISIT with Laneta Lynn Hanks, MDFlaum Eye Institute, [facility] (--) [phone]  Transportation     Prescriptions   Aleck Angle, RN11:38 AM

## 2023-11-16 NOTE — Progress Notes (Signed)
 Dialysis progress note 11/16/2023 6:49 AMSubjective:Chart reviewed. S/p MRI yesterday. Seen and examined on dialysis. Tolerating UF goal of 2L with no complaints. Plan for OR after HD today. Objective:Vitals:  11/15/23 2101 11/16/23 0045 11/16/23 0100 11/16/23 0515 BP: 155/90 122/61  123/54 BP Location: Right leg Right leg  Right leg Pulse: 86 84  69 Resp: 20 20 20 20  Temp: 36.2 C (97.2 F) 35.7 C (96.3 F)  35.6 C (96.1 F) TempSrc: Temporal Temporal  Temporal SpO2: 97% 92%  95% Weight:     Height:     Intake/Output last 3 shifts:I/O last 3 completed shifts:09/04 2300 - 09/05 2259In: 120 (1.5 mL/kg) [P.O.:120]Out: - (0 mL/kg) Net: 120Weight: 77.6 kg Physical ExamGeneral: Awake, alert, NAD, on dialysisHEENT: NC/ATCardiovascular: RRRPulmonary: CTAB, normal WOB on room airExt: no BLE edema noted, R arm in slingNeurological: answers questions appropriately, no focal deficitsAccess: HDTC, c/d/I, in use for dialysis Metabolic/Bone/NutritionRecent Labs Lab 09/05/250151 09/04/250625 09/03/252134 09/03/251959 Sodium 133 132*  --  135 Potassium 4.9 5.6*  --  5.3* Chloride 91* 87*  --  88* CO2 22 20  --  22 UN 29* 56*  --  48* Creatinine 4.36* 7.53*  --  6.66* Glucose 121* 82  --  92 Calcium  7.7* 7.9*  --  7.8* Albumin  --  3.2* 3.3*  --  Phosphorus 4.1 5.6*  --   --    Component Value Date/Time  PTH 309.0 (H) 10/17/2023 1644  Anemia Recent Labs Lab 09/05/250151 09/03/251959 WBC 10.5 10.9 Hemoglobin 10.1* 10.4* Hematocrit 33* 36* Platelets 428 337   Component Value Date/Time  FE 65 10/17/2023 1644  IBC 176 (L) 10/17/2023 1644  FESAT 37 10/17/2023 1644  FER 881 (H) 10/17/2023 1644  EKG 12 leadResult Date: 9/5/2025Sinus rhythm Low voltage, extremity and precordial leads* Humerus LEFT standard AP and  Lateral viewsResult Date: 9/4/2025No acute osseous abnormality in the  left humerus. Sequela of chronic rotator cuff disease with bony irregularity of the greater tuberosity, as seen on prior imaging. Stable mild acromioclavicular arthrosis and mild glenohumeral arthrosis. END OF IMPRESSION I have personally reviewed the images and the Resident's/Fellow's interpretation and agree with or edited the findings. * Femur RIGHT standard AP and LateralResult Date: 9/4/2025Patchy areas of lucency in the proximal femur and sclerosis in the ischium, similar to prior. No pathologic fracture. No acute osseous findings. END OF IMPRESSION I have personally reviewed the images and the Resident's/Fellow's interpretation and agree with or edited the findings. * Femur LEFT standard AP and LateralResult Date: 9/4/2025Patchy sclerotic and lucent lesions in the proximal femur and ischium, grossly unchanged compared to prior radiographic imaging. No pathologic fracture. No acute osseous abnormality. END OF IMPRESSION I have personally reviewed the images and the Resident's/Fellow's interpretation and agree with or edited the findings. CT humerus RIGHT with contrastResult Date: 9/4/2025Slightly comminuted displaced and foreshortened oblique fracture through the proximal humeral diaphysis with posterior displacement. Associated adjacent soft tissue hematoma. Arthritic changes of the glenohumeral joint with subcortical cystic changes and possible erosions. Degenerative changes of the acromioclavicular joint prominent right axillary node measuring 1 cm, nonspecific, correlate clinically END OF IMPRESSION * Elbow RIGHT standard AP, Lateral, and both ObliquesResult Date: 9/4/2025No acute fracture or dislocation. END OF IMPRESSION I have personally reviewed the images and the Resident's/Fellow's interpretation and agree with or edited the findings. * Humerus RIGHT standard AP and  Lateral viewsResult Date: 9/3/2025Fracture of the proximal humeral shaft at the level of the  deltoid tuberosity with lateral and anterior displacement of the distal fragment  as well as apex anterior angulation END OF IMPRESSION I have personally reviewed the images and the Resident's/Fellow's interpretation and agree with or edited the findings. *Chest standard frontal and lateral viewsResult Date: 9/3/2025Increased pulmonary edema. Trace right and moderate to large left pleural fluid, increased compared to prior study, with associated atelectasis. Superimposed infection is not excluded. END OF IMPRESSION I have personally reviewed the images and the Resident's/Fellow's interpretation and agree with or edited the findings. EKG 12 leadResult Date: 9/3/2025Sinus rhythm Ventricular premature complex Low voltage, extremity and precordial leads Prolonged QT interval Poor quality data, interpretation may be affectedShoulder RIGHT 3 or more viewsResult Date: 9/3/2025Mildly displaced proximal humeral shaft fracture. Diffuse osseous sclerosis. Central catheter terminates at the cavoatrial junction. Right-sided pleural effusion. Patchy airspace opacities in the right lower lung zone. Possible left-sided pleural effusion. END OF IMPRESSION I have personally reviewed the images and the Resident's/Fellow's interpretation and agree with or edited the findings.  Assessment/PlanGreg Pearson is a 63 y.o. Male with ESRD on HD TTS at [facility], stage IV carcinoma (suspected to be renal cell) with intracranial and spinal mets, CHF, HLD, GERD and ischemic colitis who presented to [facility] on 9/3 from urgent care due to a mechanical fall resulting in right humerus fracture. Ortho being consulted. Nephrology consulted for ESRD and provision of dialysis. Hemodialysis Note1. ESRD suspected to be 2/2 HTN on HD TTS at [facility] HD with rx belowF180 Na 138 K 2 Ca 2.5 HCO3 35 Qb: 400 ml/min Qd: 1.5X Qb UF 1-2.5 L as hemodynamics tolerates Renally dose medications for CrCl < 10  mL/minContinue NephroviteEDW 75 kgFollow chemistries ElectrolytesAcceptable  Acid-BaseWill keep within desired goal range (serum bicarb 22-25) with RRT against 35 bicarb Anemia: Goal hemoglobin of 10-11 gm/dl.Hgb 10.1ESA management per Heme/OncCKD-MBD: Hypocalcemia noted, would check iCa and keep > 3.5Would restart home calcitriol  0.75 mcg qTTSPhosphorus acceptableContinue Sensipar  60 mg daily ( iPTH 759 on 10/24/23)  Hemodynamics/ Orthostatic hypotensionSBP range: 100's - 140'sUF with HD as able, attempting 2LOn Midodrine  10 mg TID + florinef  Access: HDTCStable on HD. Plan for next HD per patient's usual schedule. Thank you for allowing us  to participate in the care of this patientAuthor: Alan Contes, NP  as of: 11/16/2023  at: 6:49 AM

## 2023-11-16 NOTE — Anesthesia Procedure Notes (Signed)
---------------------------------------------------------------------------------------------------------------------------------------  AIRWAY GENERAL INFORMATION AND STAFF  Patient location during procedure: OR     Date of Procedure: 11/16/2023 1:47 PMCONDITION PRIOR TO MANIPULATION   Current Airway/Neck Condition:  Normal      For more airway physical exam details, see Anesthesia PreOp EvaluationAIRWAY METHOD   Patient Position:  Sniffing  Preoxygenated: yes    Maintained In-Line Stability: not needed, normal c-spine condition        To see details of medications used, see MAR  Induction: IV  Mask Difficulty Assessment:  1 - vent by mask    Mask NMB: 1 - vent by mask    Technique Used for Successful ETT Placement:  Video laryngoscopy  Devices/Methods Used in Placement:  Intubating stylet  Blade Type:  C-MAC  Laryngoscope Blade/Video laryngoscope Blade Size:  4  Cormack-Lehane Classification:  Grade I - full view of glottis  Placement Verified by: capnometry, auscultation and equal breath sounds    Number of Attempts at Approach:  1  Number of Other Approaches Attempted:  0FINAL AIRWAY DETAILS  Final Airway Type:  Endotracheal airway  Final Endotracheal Airway:  ETT    Cuffed: Yes  Insertion Site:  Oral  ETT Size (mm):  8.0  Distance inserted from Lips (cm):  24ADDITIONAL COMMENTS Eyes taped after induction. ETT placed easily with elective Glidescope, oral mucosa and dentition intact as it was pre-op ----------------------------------------------------------------------------------------------------------------------------------------

## 2023-11-16 NOTE — Anesthesia Procedure Notes (Signed)
----------------------------------------------------------------------------------------------------------------------------------------  CENTRAL LINE   Date of Procedure: 11/16/2023 2:20 PMPLACEMENT      Final Central Line Type:  CVC Double Lumen     Final Insertion Site:  Right internal jugularAttempts (Skin Punctures):  1INDICATIONS   Indication:Volume Resuscitation, Administration of Medications and Hemodynamic Instability (Pt with poorly functioning IV in foot, will need access for this and multiple future operations. No peripheral veins.)PROCEDURE DETAILS     Patient Location: OR     Local Infiltration:  Under GA     Monitoring: full ASA Monitoring     Sterile Technique: sterile gloves, mask, sterile gown, hat and hand sanitizing     Preparation:  Sterile full drape and chlorhexidine+isopropanol    Patient position:  Trendelenburg  Technique:  sterile ultrasound     Vein initially entered with:  18 G catheter     Ectopy with Wire Guide: none  Guide Wire removed Intact     Catheter size:  8Fr     Coating: sulfdiazine/chlorhexidine     Insertion depth:  10     Correct placement confirmed with:  Manometry     Secured by:  Line sutured, biopatch and bio-occlusive dressing  Witnessed by: Gaynelle Olam HERO, CRNAPOST-PROCEDURE DETAILS     Complications:  None, well-tolerated  Patient condition at end of procedure:  No change from priorSTAFF   Performed by  Rolene Ozell SQUIBB, MD----------------------------------------------------------------------------------------------------------------------------------------

## 2023-11-16 NOTE — Significant Event (Signed)
 Pt has been optimized for surgery today and should be getting labs drawn just before dialysis. Shane Pearson [place], [place] 4:44 AM 11/16/2023 --------------------

## 2023-11-16 NOTE — Progress Notes (Signed)
 11/16/23 1154 Evaluation Attempted Evaluation Attempted Evaluation Attempted Evaluation Attempt Comment Attempted to see patient today. Patient off unit at dialysis. Will re-attempt as time allows. Lauraine Navy, PT, DPTPlease contact physical therapist on the treatment team via secure chat or via the secure chat group for your unit Physical Therapist with any questions.On weekends, please utilize the secure chat group, [facility] Physical Therapy 1st call, to contact PT.

## 2023-11-16 NOTE — DOS Update (DOS) (Signed)
 Day of Service Surgeon/Proceduralist Documentation for Shane Pearson'sProcedure(s) (LRB):RIGHT CMN (Right)Documentation related to      History of Present Illness/Indication for Procedure      Review of Systems Specific to Indicated Procedure/Operative Site      Exam Specific to Indicated Procedure/Operative Site      Planned Procedurecan be found in document listed below or in Procedure Pass Linked Documents report. No note linked to Procedure Pass task.By signing this note, I attest that the information documented above is correct, reflecting the current physical state of the patient today, and that it is appropriate to proceed with the planned surgery/procedure.

## 2023-11-16 NOTE — Progress Notes (Addendum)
 [facility] Subjective:VSS. Pain controlled. Denies chest pain, shortness of breathNo nausea or vomitting ObjectiveLast Filed Vitals  11/16/23 1311 BP: 90/54 Pulse: 89 Resp: 20 Temp:  SpO2: 100% General: NAD, Pleasant, AppropriateRespiratory: Breathing comfortably, No evidence of respiratory distressCardiac: RRRright lower extremity ExamDressing clean, dry, intact. Motor intact  ankle dorsiflexion/plantarflexion, toe flexion/extension. Fires quads.Sensation intact to light touch 1st dorsal web space/medial/lateral/plantar/dorsal foot. 2+ dorsalis pedis/posterior tibialis pulse, toes warm and well-perfused, < 3 second capillary refill.Recent Labs Lab 09/06/250820 09/05/250151 09/03/251959 WBC 11.2*  11.2* 10.5 10.9 Hemoglobin 10.1*  10.1* 10.1* 10.4* Hematocrit 34*  34* 33* 36* Platelets 498*  530* 428 337 Recent Labs Lab 09/06/250820 09/05/250151 09/04/250625 Sodium 134 133 132* Potassium 4.9 4.9 5.6* Chloride 90* 91* 87* CO2 23 22 20  No components found with this basename: BUN, LABGLOM, CALCIUMRecent Labs Lab 09/06/250820 09/03/252134 INR 1.1 1.2* No results for input(s): ESR, CRP in the last 168 hours.Imaging: NoneAssessment/Plan: Shane Pearson is a 63 y.o. male  * Day of Surgery *  s/p R femur prophylactic CMN on 9/6/2025Planned return to operating room: Yes tentative for Tuesday (09/09)  for R Humerus with ortho oncology following embolization of Right proximal humerus lesion by IR on Monday (09/08)Antibiotic regimen:  periop ancef  2 dosesPain Management: MMDVT prophylaxis and duration: per primary rec LovenoxWeight bearing status and activity restrictions: WBAT RLE, WBAT LLE W/ walker, NWB RUE in sling Wound/Pin Care: POD7 Mepilex change RLEPostoperative x-rays and radiographic studies: NoneDiet:  ADAT Foley:  N.a Additional Specifics (as needed):  F.u reaming pathologyAnticipated  follow-up date: 2 weeks for right hipHashim Alise, MDOrthopaedic Surgery9/08/2023 3:42 PM

## 2023-11-17 ENCOUNTER — Inpatient Hospital Stay

## 2023-11-17 DIAGNOSIS — M7989 Other specified soft tissue disorders: Secondary | ICD-10-CM

## 2023-11-17 DIAGNOSIS — R29898 Other symptoms and signs involving the musculoskeletal system: Secondary | ICD-10-CM

## 2023-11-17 LAB — BASIC METABOLIC PANEL
Anion Gap: 19 — ABNORMAL HIGH (ref 7–16)
Anion Gap: 19 — ABNORMAL HIGH (ref 7–16)
CO2: 24 mmol/L (ref 20–28)
CO2: 23 mmol/L (ref 20–28)
Calcium: 8.1 mg/dL — ABNORMAL LOW (ref 8.6–10.2)
Calcium: 8 mg/dL — ABNORMAL LOW (ref 8.6–10.2)
Chloride: 94 mmol/L — ABNORMAL LOW (ref 96–108)
Chloride: 94 mmol/L — ABNORMAL LOW (ref 96–108)
Creatinine: 4.12 mg/dL — ABNORMAL HIGH (ref 0.67–1.17)
Creatinine: 4.63 mg/dL — ABNORMAL HIGH (ref 0.67–1.17)
Glucose: 98 mg/dL (ref 60–99)
Glucose: 88 mg/dL (ref 60–99)
Lab: 29 mg/dL — ABNORMAL HIGH (ref 6–20)
Lab: 34 mg/dL — ABNORMAL HIGH (ref 6–20)
Potassium: 5.2 mmol/L — ABNORMAL HIGH (ref 3.3–5.1)
Potassium: 5.1 mmol/L (ref 3.3–5.1)
Sodium: 137 mmol/L (ref 133–145)
Sodium: 136 mmol/L (ref 133–145)
eGFR BY CREAT: 15 — AB
eGFR BY CREAT: 13 — AB

## 2023-11-17 LAB — CBC
Hematocrit: 34 % — ABNORMAL LOW (ref 37–52)
Hemoglobin: 9.9 g/dL — ABNORMAL LOW (ref 12.0–17.0)
MCV: 92 fL (ref 75–100)
Platelets: 468 THOU/uL — ABNORMAL HIGH (ref 150–450)
RBC: 3.7 MIL/uL — ABNORMAL LOW (ref 4.0–6.0)
RDW: 23.6 % — ABNORMAL HIGH (ref 0.0–15.0)
WBC: 12.1 THOU/uL — ABNORMAL HIGH (ref 3.5–11.0)

## 2023-11-17 LAB — MAGNESIUM: Magnesium: 2.1 mg/dL (ref 1.6–2.5)

## 2023-11-17 LAB — PHOSPHORUS: Phosphorus: 5.1 mg/dL — ABNORMAL HIGH (ref 2.7–4.5)

## 2023-11-17 MED ORDER — SENNOSIDES 8.6 MG PO TABS *I*
2.0000 | ORAL_TABLET | Freq: Every evening | ORAL | Status: DC
Start: 2023-11-17 — End: 2024-01-13
  Administered 2023-11-17: 2 via ORAL
  Filled 2023-11-17: qty 2

## 2023-11-17 MED ORDER — CEFAZOLIN 2000 MG IN STERILE WATER 20ML SYRINGE *I*
2000.0000 mg | PREFILLED_SYRINGE | INTRAVENOUS | Status: DC
Start: 2023-11-19 — End: 2023-11-23
  Administered 2023-11-19: 2000 mg via INTRAVENOUS
  Filled 2023-11-17: qty 2000

## 2023-11-17 MED ORDER — POLYETHYLENE GLYCOL 3350 PO PACK 17 GM *I*
17.0000 g | PACK | Freq: Every day | ORAL | Status: DC
Start: 2023-11-17 — End: 2024-01-16
  Administered 2023-11-17 – 2023-11-18 (×2): 17 g via ORAL
  Filled 2023-11-17 (×2): qty 17

## 2023-11-17 MED ORDER — CEFAZOLIN 2000 MG IN STERILE WATER 20ML SYRINGE *I*
2000.0000 mg | PREFILLED_SYRINGE | INTRAVENOUS | Status: DC
Start: 2023-11-19 — End: 2023-11-17

## 2023-11-17 MED ORDER — ALTEPLASE 2 MG IJ SOLR *I*
0.5000 mg | Freq: Once | INTRAMUSCULAR | Status: AC
Start: 2023-11-17 — End: 2023-11-17
  Administered 2023-11-17: 2 mg
  Filled 2023-11-17: qty 2

## 2023-11-17 MED ORDER — CEFAZOLIN 1000 MG IN STERILE WATER 10ML SYRINGE *I*
3000.0000 mg | PREFILLED_SYRINGE | INTRAVENOUS | Status: DC
Start: 2023-11-23 — End: 2023-11-30

## 2023-11-17 MED ORDER — OXYCODONE HCL 5 MG PO TABS *I*
5.0000 mg | ORAL_TABLET | ORAL | Status: DC | PRN
Start: 2023-11-17 — End: 2023-11-20

## 2023-11-17 MED ORDER — OXYCODONE HCL 10 MG PO TABS *I*
10.0000 mg | ORAL_TABLET | ORAL | Status: DC | PRN
Start: 2023-11-17 — End: 2023-11-20
  Administered 2023-11-18: 10 mg via ORAL
  Filled 2023-11-17: qty 1

## 2023-11-17 NOTE — Progress Notes (Signed)
 Assumed care of patient  1900-0300.  VSS throughout shift.  PRN pain meds given per pt request. Please refer to patient flowsheets and eMAR for further assessments and documentation. No further complaints at this time.

## 2023-11-17 NOTE — Progress Notes (Signed)
 Hematology Oncology Hospitalist Progress Note  LOS: 4 days  Full Code Significant Events/Subjective: - Underwent placement of prophylactic right femoral CMN by orthopedic surgery yesterday.  Right IJ CVC was placed during the procedure and left in place.  - Mr. Colleran was hypotensive after the procedure, although reportedly asymptomatic.  No interventions for his hypotension were required.Mr. Grabe was joined at bedside this morning by his wife, Cheryl.-Mr. Welshans reported that pain in his right femur is well-controlled today.  He continues to have pain at the site of his right humerus fracture, but states this only bothers him if he moved the wrong way.- I reviewed with Mr. Spurgeon and [name] plans for multiple interventions over the next several days, starting with IR embolization of his right humerus lesion on 9/8 in preparation for operative fixation on 9/9.Review of Systems Respiratory:  Negative for shortness of breath.  Cardiovascular:  Negative for chest pain and palpitations. Gastrointestinal:  Negative for abdominal pain, nausea and vomiting. Objective: Physical ExamTemp:  [35.2 C (95.4 F)-36.5 C (97.7 F)] 36 C (96.8 F)Heart Rate:  [74-108] 108Resp:  [14-29] 15BP: (57-113)/(39-55) 90/50 Physical ExamVitals reviewed. Constitutional:     General: He is not in acute distress.   Comments: Appears fatigued HENT:    Mouth/Throat:    Mouth: Mucous membranes are moist.    Pharynx: Oropharynx is clear. Eyes:    Extraocular Movements: Extraocular movements intact. Neck: Cardiovascular:    Rate and Rhythm: Normal rate and regular rhythm.    Heart sounds: Normal heart sounds. Pulmonary:    Effort: Pulmonary effort is normal. No tachypnea.    Breath sounds: No decreased breath sounds, wheezing, rhonchi or rales. Abdominal:    General: Abdomen is flat. Bowel sounds are normal.    Palpations: Abdomen is soft.     Tenderness: There is no abdominal tenderness. Musculoskeletal:    Right upper arm: Swelling (RUE held in sling) present. No deformity.    Right lower leg: No edema.    Left lower leg: No edema. Neurological:    Mental Status: He is alert.    Motor: Motor function is intact. No atrophy. Recent Lab Studies:Personally reviewed and notable qnm:Mzrzwu Labs Lab 09/07/250248 09/06/250820 09/05/250151 WBC 12.1* 11.2*  11.2* 10.5 Hemoglobin 9.9* 10.1*  10.1* 10.1* Hematocrit 34* 34*  34* 33* Platelets 468* 498*  530* 428  Recent Labs Lab 09/07/251039 09/07/250248 09/06/250820 09/05/250151 09/04/250625 09/03/252134 09/03/251959 Sodium 136 137 134 133 132*  --   --  Potassium 5.1 5.2* 4.9 4.9 5.6*  --   --  Chloride 94* 94* 90* 91* 87*  --   --  CO2 23 24 23 22 20   --   --  UN 34* 29* 52* 29* 56*  --   --  Creatinine 4.63* 4.12* 6.27* 4.36* 7.53*  --   --  Glucose 88 98 87 121* 82  --   --  Calcium  8.0* 8.1* 7.6* 7.7* 7.9*  --   --  Albumin  --   --   --   --  3.2* 3.3*  --  Phosphorus  --  5.1* 4.7* 4.1 5.6*  --    < >  < > = values in this interval not displayed. Recent Labs Lab 09/06/250820 09/03/252134 INR 1.1 1.2* Protime 12.5 13.3* PATHOLOGY:Paraspinal muscle biopsy (8/29): -Metastatic carcinoma involving skeletal muscle.  COMMENT: The specimen is compared to the prior biopsy ([id number]) and is similar in morphology. Immunohistochemistry shows that the tumor cells are positive for pancytokeratin and PAX8, with  focal CAIX positivity, while negative for GATA3, CK7, and CK20. These findings are most consistent with a renal primary. Intradepartmental consultation was obtained ([facility] pathology). Recent Imaging Studies:Personally reviewed and notable for:MR head without and with contrastResult Date: 9/6/2025Decreased sensitivity/specificity secondary to significant patient motion. Overall,  interval progression of the intracranial metastatic disease with new/growing metastatic lesions, increasing leptomeningeal enhancement, and multifocal cranial nerve enhancement. New subtle FLAIR hyperintensity and diffusion restriction within the splenium of the corpus callosum, probably representing a cytotoxic lesion of the corpus callosum. Stable patchy signal/enhancement within the calvarium. Findings are nonspecific and could be related to known renal osteodystrophy or metastatic disease. Attention on follow-up imaging is recommended. END OF IMPRESSION Current Inpatient Medications:. [START ON 11/19/2023] ceFAZolin  IV  2,000 mg Intravenous Every Tues,Thurs & Sat  Followed by . [START ON 11/23/2023] ceFAZolin  IV  3,000 mg Intravenous Weekly . polyethylene glycol  17 g Oral Daily . senna  2 tablet Oral Nightly . atorvastatin   20 mg Oral Daily with dinner . B complex-vitamin C-folic acid   1 tablet Oral Daily . cinacalcet   60 mg Oral QPM . dexAMETHasone   2 mg Oral BID WC . fludrocortisone   100 mcg Oral TID . fluticasone   1 spray Nasal Daily . magnesium  oxide  400 mg Oral Daily . midodrine   10 mg Oral TID . pantoprazole   40 mg Oral QAM . acetaminophen   1,000 mg Oral TID oxyCODONE  **OR** oxyCODONE , DiphenhydrAMINE -Lidocaine -Maalox (BMX/First Mouthwash), melatonin, heparin  (porcine), heparin  (porcine), sodium chloride , dextrose , naloxoneAssessment: Mr. 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  He is in the process of undergoing staged orthopedic procedures for fracture prophylaxis in addition to fixation of the right humerus.  Additional work-up for initiation of radiation therapy to brain metastases is  underway as well:Plans: ONC: Stage IV carcinoma, renal cell:-Tissue origin confirmed on biopsy of 8/29-Followed outpatient by Dr. Bronson: Not yet started on systemic therapy-MR head w/wo contrast (9/5) showing progression on intracranial metastatic disease: -Radiation oncology consulted for consideration of provision of brain XRT; plan to re-engage on 9/8 -S/p 5 fractions of radiation to the cervical spine 9/3-Continue Decadron  2mg  BID as directed by radiation oncology-Orthopedic surgery consulting: -Plan for staged prophylactic IMN to femurs B/L:  -POD1 s/p IMN to right femur (follow-up reaming pathology) -WBAT on both LE-Timing of prophylactic IMN to left femur TBD Mechanical fall resulting in R spiral humeral shaft fracture, concern for pathologic fx -In sling, NWB in RUE-Orthopedic surgery following: -Plan for IR embolization on 9/8 followed by fixation, planned tentatively for 9/9-Pain control            -Tylenol  1g TID             -OxyIR 5/10 mg q4h PRN            -Decadron  as above  Hypotension, chronic:-Continue midodrine  10 mg TID-Florinef  100 mcg TID Hyperlipidemia:-Holding home aspirin  -Home atorvastatin  20 mg daily ESRD on HD, hyperkalemia:-Nephro-Vite daily-Cinacalcet  60 mg daily-Nephrology following for provision of HD -HD today per the patient's usual schedule (TThSa)-K+ 5.1 today; monitor on BMP daily GERD:-Pantoprazole  40mg  daily F: OralE: BMP, Mg2+, phosphorus dailyN: Regular diet: NPO for IR procedure on 9/8DVT PPx: On holdCode Status: Full CodeDischarge Planning: (EDD):  9/15Discharge Criteria/Barriers to Discharge: Multiple surgical procedures plannedPT and/or OT Recommendations/Discharge to: TBD; likely  rehabAppointments Needed with: Orthopedic surgeryPlan of care discussed with oncology PA Dena Lyons, PT Lauraine Goodpasture and bedside RN.Lamar MYRTIS Reap, MD, MPHAttending  Physician, [facility] Medicine [phone] [facility]

## 2023-11-17 NOTE — Progress Notes (Signed)
 Assumed care of patient 0300-1500. Patient had soft blood pressures throughout shift, All other vital signs stable throughout shift. Patient had no complaints throughout shift.Patient's RUE remained in a sling and non weight bearing throughout shift. Patient's PCVC not giving blood return, TPA instilled into both lumens. Blood return noted after TPA dwell time, PCVC is extremely positional. Provider notified.  Report given to oncoming nurse for continuity of care. Please see doc flowsheets and eMAR for additional details. Camie Maudlin, RN

## 2023-11-17 NOTE — Plan of Care (Signed)
 Assumed care of patient 1500-0300. VSS throughout shift, besides hypotensive blood pressures which were not symptomatic. Patient had no complaints throughout shift and is making needs known to Clinical research associate. Please refer to patient flowsheets and eMAR for further assessments and documentation. Problem: MobilityGoal: Patient's functional status is maintained or improvedOutcome: Progressing towards goal Problem: SafetyGoal: Patient will remain free of fallsOutcome: MaintainingGoal: Prevent any intentional injuryOutcome: Maintaining Problem: Pain/ComfortGoal: Patient's pain or discomfort is manageableOutcome: Maintaining

## 2023-11-17 NOTE — Progress Notes (Addendum)
 [place] [facility] [place] [place] [place] Subjective:NAE. Pressures soft in the 90/40s, close to baseline compared to previous pressures in house. Remainder of VSS. Pain well controlled. Tolerating PO. Denies fevers, chills, chest pain, shortness of breath, nausea, or vomiting. HCT 34 (34). Intra-op pathology remains pending. Pending IR embolization tomorrow. ObjectiveLast Filed Vitals  11/17/23 0349 BP: (!) 92/40 Pulse: 74 Resp: 19 Temp: 35.7 C (96.3 F) SpO2: 91% General: NAD, Pleasant, AppropriateRespiratory: Breathing comfortably, No evidence of respiratory distressCardiac: RRRRUE exam: Sling in place. Intact AIN/PIN.Ulnar n motor function. SILT along the 1st DWS, deltoid, volar index/small fingers. Digits WWP.right lower extremity ExamDressing clean, dry, intact. Motor intact  ankle dorsiflexion/plantarflexion, toe flexion/extension.Sensation intact to light touch 1st dorsal web space/medial/lateral/plantar/dorsal foot. 2+ dorsalis pedis/posterior tibialis pulse, toes warm and well-perfused, < 3 second capillary refill.LLE Exam: Motor intact  ankle dorsiflexion/plantarflexion, toe flexion/extension.Sensation intact to light touch 1st dorsal web space/medial/lateral/plantar/dorsal foot. 2+ dorsalis pedis/posterior tibialis pulse, toes warm and well-perfused, < 3 second capillary refill.Recent Labs Lab 09/07/250248 09/06/250820 09/05/250151 WBC 12.1* 11.2*  11.2* 10.5 Hemoglobin 9.9* 10.1*  10.1* 10.1* Hematocrit 34* 34*  34* 33* Platelets 468* 498*  530* 428 Recent Labs Lab 09/07/250248 09/06/250820 09/05/250151 Sodium 137 134 133 Potassium 5.2* 4.9 4.9 Chloride 94* 90* 91* CO2 24 23 22  No components found with this basename: BUN, LABGLOM, CALCIUMRecent Labs Lab 09/06/250820 09/03/252134 INR 1.1 1.2* No results for input(s): ESR, CRP in the last 168  hours.Imaging: NoneAssessment/Plan: Colter Magowan is a 63 y.o. male  1  s/p R femur prophylactic CMN on 11/17/2023. Also with R proximal humeral and L femur lesions pending future ppx stabilization. Planned return to operating room: Yes tentative for Tuesday (09/09)  for R Humerus with ortho oncology following embolization of right proximal humerus lesion by IR on Monday (09/08)Antibiotic regimen:  periop ancef  2 dosesPain Management: MMDVT prophylaxis and duration: per primary rec LovenoxWeight bearing status and activity restrictions: WBAT RLE, WBAT LLE W/ walker, NWB RUE in sling Wound/Pin Care: POD7 Mepilex change RLEPostoperative x-rays and radiographic studies: NoneDiet: ADAT Foley:  N.a Additional Specifics (as needed):  F.u reaming pathologyAnticipated  follow-up date: 2 weeks for right hipChristopher Dussik, MDOrthopaedic Surgery9/09/2023 6:43 AMI saw and evaluated the patient. I agree with the resident's/fellow's findings and plan of care as documented.[name], MD 11/17/2023 8:22 AM Department of Orthopaedic Surgery

## 2023-11-17 NOTE — Progress Notes (Signed)
 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  He is in the process of undergoing staged orthopedic procedures for fracture prophylaxis in addition to fixation of the right humerus.  Additional work-up for initiation of radiation therapy to brain metastases is underway as well: Past Medical History[1] Past Surgical History: Procedure Laterality Date  DIALYSIS FISTULA CREATION Bilateral   4 fistulas have failed- ? d/t low BP and small veins  HERNIA REPAIR    PR LIGATION/BIOPSY TEMPORAL ARTERY Right 10/04/2023  Procedure: TEMPORAL ARTERY BIOPSY RIGHT EYE;  Surgeon: Lynn Ervin Edelman, MD;  [facility] MAIN OR;  Service: Ophthalmology  Right inguinal hernia repair   Personal factors affecting treatment/recovery:Frequent readmissionsHistory of fallsRecent hospital admission (within last 6 months)Comorbidities affecting treatment/recovery:Cancer (chemotherapy/radiation therapy)Cardiac historyEnd stage renal disease on dialysis (ESRD)Clinical presentation:stablePatient complexity:  low level as indicated by above stability of condition, personal factors,  environmental factors and comorbidities in addition to their impairments found on physical exam. 11/17/23 1525 Prior Living  Prior Living Situation Reported by patient Lives With Spouse;Family(Wife and patient's mother (90y/o)) Type of Home [place] # Steps to Enter Home 2 # Rails to Enter Home 1 # Of Steps In Home (Stair glide to basement - reports his office/entertainment  are downstairs) Location of Bedrooms 1st floor Location of Bathrooms 1st floor Bathroom Accessibility Walk-in shower Current Home Equipment Adjustable bed;Grab bars in shower/tub;Grab bars around toilet;Shower seat (built in);Walker (rolling);Walker (rollator);Wheelchair (manual);[place] (straight)(Spouse also reports they are going to install a ramp) Additional Comments Spouse reports she is currently using FMLA to be home w/ spouse. Prior Function Level Prior Function Level Reported by patient Transfers Independent Transfer Devices None Walking Independent Walking assistive devices used None Stair negotiation Independent History of Falls Yes Frequency of Falls Reports most recent fall was last week on his last day of radiation, states he fell into the dryer. Also comments on near-fall just prior to admission when he tripped on rug. 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 PT Tracking PT TRACKING PT Assigned Visit Number Visit Number [facility]) / Treatment Day (HH) 1 Visit Details [facility]) Visit Type [facility]) Evaluation Precautions/Observations Precautions used Yes Weight Bearing Status RUE NWB;LLE WBAT;RLE WBAT Brace Applied Yes Brace Type Sling(RUE) Fall Precautions General falls precautions Activity Orders Present Yes LDA Observation Dialysis catheter;Central Line Vital Signs Response with Therapy NAD Isolation Precautions None Was  patient wearing a mask? No Was Visitor Present Yes (comment)(wife) Was Visitor Masked No PPE worn by Clinical research associate Mask;Gloves Patient Subjective Patient received up in chair, reports extreme fatigue from all the pain medications he received. Other Dialysis days are Tues/Thurs/Sat. Current Pain Assessment Pain Assessment / Reassessment Assessment Pain Scale 0-10 (Numeric Scale for Pain Intensity) Pain Comments Endorsing buttocks pain from sitting in chair - offered waffle cushion, patient reports he does not care for the hospital cushion. Vision  Current Vision  Adequate for PT session Communication Communication Communication Style Communication Style Verbal Cognition Cognition Tested Arousal/Alertness Appropriate responses to stimuli Ability to Follow Instructions Follows simple commands without difficulty Additional Comments Pleasant and cooperative LE Assessment Assessment Focus Strength LLE Strength Hip Flexion 3-/5 Knee Flexion 3+/5 Knee Extension 3+/5 Ankle Dorsiflexion 3+/5 Ankle Plantar Flexion 3+/5 RLE Strength Hip Flexion 3-/5 Knee Flexion 3+/5 Knee Extension 3+/5 Ankle Dorsiflexion 3+/5 Ankle Plantar Flexion 3+/5 Sensation Sensation No apparent deficit Bed Mobility Bed mobility Not tested Additional comments In chair pre/post PT. Patient deferred mobility at this time 2/2 severe fatigue. Transfers Transfers Not tested Mobility Mobility: Gait/Stairs Not tested (comment) Training and Education Patient Educated patient on PT POC, recommendations, and role of PT in hospital setting. Discussed potential need for rehab, patient reports its his/wife's goal to d/c directly home Caregiver Supportive spouse at bedside t/o. Reports she does not plan to go back to work. Therapeutic Exercises Additional comments Educated patient on seated therex to be completed daily Balance Balance Not tested Additional comments Seated t/o  Functional Outcome Measures Functional Outcome Measures Yes PT AM-PAC Mobility Turning over in bed? 1 Moving from lying on back to sitting on the side of the bed? 1 Moving to and from a bed to a chair? 1 Sitting down on and standing up from a chair with arms? 1 Need to walk in hospital room? 1 Climbing 3 - 5 steps with a railing? 1 Total Raw Score 6 AM-PAC T-Scale Score 16.59 Assessment Brief Assessment Appropriate for skilled therapy Problem List Pain contributing to impairment;Impaired functional mobility Patient / Family Goal Ultimately go home Overall Assessment Per MD, patient is slated for R Humerus fixation Tuesday, potential L femur prophylactic surgery Thurs, requires brain radiation and potential chemo tx as well. Patient and spouse both voice desire for direct d/c home. Recommend additional PT visits prior to d/c. Plan/Recommendation PT Treatment Interventions Bed mobility training;Transfers training;Wheelchair training;Pt/Family education;Strengthening;Gait training;Family Training;Home exercise program instruction;D/C planning;Will work to minimize pain while promoting mobility whenever possible;Positioning PT Frequency 3-5 x/wk PT Positioning Recommendations OOBTC Daily PT Mobility Recommendations Stand pivot 2A with gait belt and hemiwalker PT Referral Recommendations OT;SW PT Discharge Recommendations Needs further medical management for discharge planning PT Discharge Equipment Recommended To be determined Transportation Recommendations Stretcher PT Assessment/Recommendations Reviewed With: Patient;Nursing;Physician Next PT Visit Functional assessment PT needs to see patient prior to DC  Yes Time Calculation Total Time Therapeutic Activities (minutes) 0 Total Time Gait Training (minutes) 0 Total Time Therapeutic Exercises (minutes) 0 Total Time Neuromuscular Re-education (minutes) 0 Total Time Caregiver Training (minutes) 0 Total  Time Group Therapy (minutes) 0 PT Timed Codes 0 PT Untimed Codes 32 PT Unbilled Time 0 PT Total Treatment 32 Plan and Onset date Plan of Care Date 11/17/23 Onset Date 11/13/23 Treatment Start Date 11/17/23 Lauraine Navy, PT, DPTPlease contact physical therapist on the treatment team via secure chat or via the secure chat group for your unit Physical Therapist with any questions.On weekends, please utilize the secure chat group, [facility] Physical Therapy 1st call, to contact PT. [1] Past Medical History:Diagnosis Date  Arthritis   CHF (congestive heart failure)   Colon polyp   Dialysis patient   ESRD (end stage renal disease)   GERD (gastroesophageal reflux disease)   Hypercholesterolemia   Hypotension   Ischemic colitis   Thyroid  disease   hypo

## 2023-11-18 ENCOUNTER — Encounter: Payer: Self-pay | Admitting: Anesthesiology

## 2023-11-18 ENCOUNTER — Ambulatory Visit

## 2023-11-18 ENCOUNTER — Encounter: Payer: Self-pay | Admitting: Oncology

## 2023-11-18 ENCOUNTER — Inpatient Hospital Stay

## 2023-11-18 DIAGNOSIS — M84421A Pathological fracture, right humerus, initial encounter for fracture: Principal | ICD-10-CM

## 2023-11-18 LAB — CBC
Hematocrit: 33 % — ABNORMAL LOW (ref 37–52)
Hemoglobin: 9.8 g/dL — ABNORMAL LOW (ref 12.0–17.0)
MCV: 90 fL (ref 75–100)
Platelets: 607 THOU/uL — ABNORMAL HIGH (ref 150–450)
RBC: 3.7 MIL/uL — ABNORMAL LOW (ref 4.0–6.0)
RDW: 23.8 % — ABNORMAL HIGH (ref 0.0–15.0)
WBC: 13.9 THOU/uL — ABNORMAL HIGH (ref 3.5–11.0)

## 2023-11-18 LAB — TYPE AND SCREEN
ABO RH Blood Type: A POS
Antibody Screen: NEGATIVE

## 2023-11-18 LAB — BASIC METABOLIC PANEL
Anion Gap: 22 — ABNORMAL HIGH (ref 7–16)
CO2: 20 mmol/L (ref 20–28)
Calcium: 8.3 mg/dL — ABNORMAL LOW (ref 8.6–10.2)
Chloride: 91 mmol/L — ABNORMAL LOW (ref 96–108)
Creatinine: 6.31 mg/dL — ABNORMAL HIGH (ref 0.67–1.17)
Glucose: 83 mg/dL (ref 60–99)
Lab: 50 mg/dL — ABNORMAL HIGH (ref 6–20)
Potassium: 5.9 mmol/L — ABNORMAL HIGH (ref 3.3–5.1)
Sodium: 133 mmol/L (ref 133–145)
eGFR BY CREAT: 9 — AB

## 2023-11-18 LAB — VENOUS GASES / WHOLE BLOOD PANEL
Base Excess,VENOUS: -3 mmol/L (ref <=2)
Bicarbonate,VENOUS: 24 mmol/L (ref 21–28)
CO2 (Calc),VENOUS: 25 mmol/L (ref 22–31)
CO: 1.4 %
Chloride,WB: 93 mmol/L — ABNORMAL LOW (ref 96–108)
FO2 HB,VENOUS: 72 % (ref 63–83)
Glucose,WB: 108 mg/dL — ABNORMAL HIGH (ref 60–99)
Hemoglobin: 10.5 g/dL — ABNORMAL LOW (ref 12.0–17.0)
ICA @7.4,WB: 4.2 mg/dL — ABNORMAL LOW (ref 4.8–5.2)
ICA Uncorr,WB: 4.3 mg/dL — ABNORMAL LOW (ref 4.8–5.2)
Lactate VEN,WB: 3.5 mmol/L (ref 0.5–2.2)
Methemoglobin: 1.8 % — ABNORMAL HIGH (ref 0.0–1.0)
NA, WB: 128 mmol/L — ABNORMAL LOW (ref 135–145)
PCO2,VENOUS: 48 mmHg (ref 40–50)
PH,VENOUS: 7.3 — ABNORMAL LOW (ref 7.32–7.42)
PO2,VENOUS: 48 mmHg — ABNORMAL HIGH (ref 30–43)
Potassium,WB: 5 mmol/L — ABNORMAL HIGH (ref 3.3–4.6)

## 2023-11-18 LAB — POTASSIUM: Potassium: 5.4 mmol/L — ABNORMAL HIGH (ref 3.3–5.1)

## 2023-11-18 LAB — PROTIME-INR
INR: 1.4 — ABNORMAL HIGH (ref 0.9–1.1)
Protime: 15.6 s — ABNORMAL HIGH (ref 10.0–12.9)

## 2023-11-18 LAB — MAGNESIUM: Magnesium: 2 mg/dL (ref 1.6–2.5)

## 2023-11-18 LAB — POCT GLUCOSE
Glucose POCT: 67 mg/dL (ref 60–99)
Glucose POCT: 85 mg/dL (ref 60–99)

## 2023-11-18 LAB — PHOSPHORUS: Phosphorus: 5.6 mg/dL — ABNORMAL HIGH (ref 2.7–4.5)

## 2023-11-18 MED ORDER — FENTANYL CITRATE 50 MCG/ML IJ SOLN *WRAPPED*
INTRAMUSCULAR | Status: AC
Start: 2023-11-18 — End: 2023-11-18
  Filled 2023-11-18: qty 2

## 2023-11-18 MED ORDER — IOHEXOL 350 MG/ML (OMNIPAQUE) IV SOLN *I*
Freq: Once | INTRAVENOUS | Status: AC | PRN
  Administered 2023-11-18: 110 mL via INTRAVENOUS

## 2023-11-18 MED ORDER — FENTANYL CITRATE 50 MCG/ML IJ SOLN *WRAPPED*
Freq: Once | INTRAMUSCULAR | Status: AC | PRN
Start: 2023-11-18 — End: 2023-11-18
  Administered 2023-11-18: 25 ug via INTRAVENOUS

## 2023-11-18 MED ORDER — LIDOCAINE HCL (PF) 1 % IJ SOLN *I*
Freq: Once | INTRAMUSCULAR | Status: AC | PRN
  Administered 2023-11-18: 10 mL via SUBCUTANEOUS

## 2023-11-18 MED ORDER — HEPARIN SODIUM (PORCINE) 1000 UNIT/ML IJ SOLN *WRAPPED*
Status: AC
Start: 2023-11-18 — End: 2023-11-18
  Filled 2023-11-18: qty 10

## 2023-11-18 MED ORDER — ALTEPLASE 2 MG IJ SOLR *I*
0.5000 mg | Freq: Once | INTRAMUSCULAR | Status: AC
Start: 2023-11-18 — End: 2023-11-18
  Administered 2023-11-18: 2 mg
  Filled 2023-11-18: qty 2

## 2023-11-18 MED ORDER — HEPARIN SODIUM (PORCINE) 1000 UNIT/ML IJ SOLN *WRAPPED*
Freq: Once | Status: AC | PRN
  Administered 2023-11-18: 1000 [IU] via INTRAVENOUS

## 2023-11-18 MED ORDER — HEPARIN SODIUM (PORCINE) 1000 UNIT/ML IJ SOLN *WRAPPED*
Freq: Once | Status: AC | PRN
Start: 2023-11-18 — End: 2023-11-18
  Administered 2023-11-18: 2000 [IU] via INTRAVENOUS

## 2023-11-18 MED ORDER — HEPARIN (PORCINE) IN 0.9% NACL 2 UNITS/ML IJ SOLN WRAPPED *I*
Status: AC
Start: 2023-11-18 — End: 2023-11-18
  Filled 2023-11-18: qty 1000

## 2023-11-18 MED ORDER — CEFAZOLIN 1000 MG IN STERILE WATER 10ML SYRINGE *I*
PREFILLED_SYRINGE | Freq: Once | INTRAVENOUS | Status: AC | PRN
Start: 2023-11-18 — End: 2023-11-18
  Administered 2023-11-18: 1 g via INTRAVENOUS

## 2023-11-18 MED ORDER — CEFAZOLIN 1000 MG IN STERILE WATER 10ML SYRINGE *I*
PREFILLED_SYRINGE | INTRAVENOUS | Status: AC
Start: 2023-11-18 — End: 2023-11-18
  Filled 2023-11-18: qty 10

## 2023-11-18 MED ORDER — SODIUM CHLORIDE 0.9 % IV BOLUS *I*
1000.0000 mL | Freq: Once | Status: AC
Start: 2023-11-19 — End: 2023-11-19
  Administered 2023-11-18: 1000 mL via INTRAVENOUS

## 2023-11-18 MED ORDER — LIDOCAINE HCL 1 % IJ SOLN *I*
INTRAMUSCULAR | Status: AC
Start: 2023-11-18 — End: 2023-11-18
  Filled 2023-11-18: qty 20

## 2023-11-18 MED ORDER — SODIUM ZIRCONIUM CYCLOSILICATE 10 G PO PACK  *I*
10.0000 g | PACK | Freq: Three times a day (TID) | ORAL | Status: DC
Start: 2023-11-18 — End: 2023-11-19
  Filled 2023-11-18 (×2): qty 10
  Filled 2023-11-18 (×2): qty 1

## 2023-11-18 MED ORDER — INSULIN REGULAR HUMAN 100 UNIT/ML IJ SOLN *I*
5.0000 [IU] | Freq: Once | INTRAMUSCULAR | Status: AC
Start: 2023-11-18 — End: 2023-11-18
  Administered 2023-11-18: 5 [IU] via INTRAVENOUS

## 2023-11-18 MED ORDER — DEXTROSE 50 % IV SOLN *I*
25.0000 g | INTRAVENOUS | Status: AC | PRN
Start: 2023-11-18 — End: 2023-11-18

## 2023-11-18 MED ORDER — SENNOSIDES 8.6 MG PO TABS *I*
2.0000 | ORAL_TABLET | Freq: Two times a day (BID) | ORAL | Status: DC
Start: 2023-11-18 — End: 2024-01-12
  Administered 2023-11-18 – 2023-11-21 (×6): 2 via ORAL
  Filled 2023-11-18 (×6): qty 2

## 2023-11-18 MED ORDER — ALBUTEROL SULFATE (5 MG/ML) 0.5% NEBS SOLUTION *I*
10.0000 mg | INHALATION_SOLUTION | Freq: Once | RESPIRATORY_TRACT | Status: AC
Start: 2023-11-18 — End: 2023-11-18
  Administered 2023-11-18: 10 mg via RESPIRATORY_TRACT
  Filled 2023-11-18: qty 2

## 2023-11-18 MED ORDER — DEXTROSE 50 % IV SOLN *I*
25.0000 g | Freq: Once | INTRAVENOUS | Status: AC
Start: 2023-11-18 — End: 2023-11-18
  Administered 2023-11-18: 25 g via INTRAVENOUS
  Filled 2023-11-18: qty 50

## 2023-11-18 MED ORDER — SODIUM ZIRCONIUM CYCLOSILICATE 10 G PO PACK  *I*
10.0000 g | PACK | Freq: Three times a day (TID) | ORAL | Status: DC
Start: 2023-11-18 — End: 2023-11-18
  Filled 2023-11-18 (×3): qty 1

## 2023-11-18 MED ORDER — POLYETHYLENE GLYCOL 3350 PO PACK 17 GM *I*
17.0000 g | PACK | Freq: Two times a day (BID) | ORAL | Status: DC
Start: 2023-11-18 — End: 2024-01-15
  Administered 2023-11-18 – 2023-11-19 (×2): 17 g via ORAL
  Filled 2023-11-18 (×2): qty 17

## 2023-11-18 MED FILL — Gadopiclenol IV Soln 0.5 MMOL/ML: INTRAVENOUS | Qty: 5.24 | Status: AC

## 2023-11-18 NOTE — Provider Consult (Incomplete)
 Neurology Attending Addendum:The student was personally supervised by me during the patient examination. I personally saw and evaluated the patient, provided the medical decision-making, and reviewed and verified the key elements of the student documentation. 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  At time of my evaluation today, he was at HD and was undergoing evaluation for hypotension.  At this time, he was noted to be encephalopathic and perseverative, although this was my first time meeting him, so his baseline was unclear to me.  Otherwise, cranial nerves appear intact outside of hoarseness of the voice, that he reports has been persistent since his radiation.  His strength appeared symmetric and sensation was intact.  Reflexes were intact.  Shortly after evaluation, his evaluation was expanded for concern for severe sepsis of unclear etiology and he was later transferred to the ICU.  Given obvious LMD on imaging, no need for CSF from that perspective.  Radiation oncology has already been engaged for WB XRT.  We will continue to follow-up with neuro-oncology regarding recommendations for treatment, with plan pending his clinical course at this time.Shane Pearson, MD8:46 PMGeneral Neurology Consult NoteConsult question: New head MRI with new mets and leptomeningeal enhancements, would like consult and recommendationsRequested by: [facility] heme/onc teamHistory of Present Illness: Shane Pearson is a 63 y.o. 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 He presented to the ED on 9/3 with right midshaft humeral fracture from ground level fall. In July 2025, he was found to have intracranial, spinal, and bone metastases from renal origin. Brain MRI showed numerous multifocal areas of punctate enhancement scattered through the brain parenchyma. On 8/26, he presented to the ED the day after beginning radiation of the cervical spine due to worsening bilateral lower extremity weakness and pain. He also mentioned having paresthesias of the bilateral upper arms. There was concern for cord compression, but an MRI performed on 8/28 did not reveal cord or caudal equina compression. Though the MRI did reveal progression of vertebral and paraspinal metastasis with mild C7 canal narrowing. MRI of the lumbar spine revealed L4/5 disc bulge. On 9/2, he complained of on and off pains on C7 and lower back. But since then, he has not had any focal neurologic deficits and has remained cognitively intact.Today, after seeing him in person, he did not have any focal neurological deficits on physical exam. Though he did have pain while testing motor movement in all extremities. His R humerus fracture limited him from lifting his R arm. Sensation was intact. No abnormal findings on cranial nerve exam. We noticed his voice was a bit hoarse, but unsure if this is simply his baseline. He previously had plans to undergo surgical fixation of the R humerus fracture, but was not optimized to undergo it today due to severe sepsis of unclear etiology.Past History: Patient's past medical, surgical, social, and family history was reviewed.  Patient's allergies were reviewed.  Please see eRecord for full details.Objective: BP: (80-115)/(31-58) Temp:  [35.2 C (95.4 F)-36.5  C (97.7 F)] Temp src: Temporal (09/08 1240)Heart Rate:  [92-105] Resp:  [17-20]  SpO2:  [89 %-94 %] Body mass index is 25.25 kg/m. Continuous infusions: sodium chloride  0.9% IVMental Status: NAD, awake and resting in bed. Oriented to person, place, and time. Fluent. Comprehension intact. Cranial Nerves:     II: Pupils 3/3 to 2/2, fields intact to confrontation.   III/IV/VI: Versions intact without nystagmus, no gaze preference.  V: Facial sensation symmetric to light touch  VII: Facial expression symmetric  VIII: Hearing intact to voice  IX/X: Palate elevates symmetrically  XI: Shoulder shrug symmetric  XII: Tongue midlineMotor: Bulk diminished, tone normal. There were no abnormal movements. Strength to confrontation:Motor exam was limited by pain though it appeared anti-gravity briefly in all extremities.Grip strength normal bilaterallySensory: Sensation to light touch intact.Coordination: DeferredGait: DeferredLaboratory Data:9/8: WBC 12.6, RBC 3.6, hemoglobin 9.7, hematocrit 32, platelets 633, Protime 16.7, INR 1.5, anion gap 20, UN 55, creatinine 6.80, eGFR 8, Alk Phos 274, AST 72, ALT <5Imaging and additional studies:MRI head without and with contrast Result Date: 9/6/2025Decreased sensitivity/specificity secondary to significant patient motion. Overall, interval progression of the intracranial metastatic disease with new/growing metastatic lesions, increasing leptomeningeal enhancement, and multifocal cranial nerve enhancement; New subtle FLAIR hyperintensity and diffusion restriction within the splenium of the corpus callosum, probably representing a cytotoxic lesion of the corpus callosum; Stable patchy signal/enhancement within the calvarium. Findings are nonspecific and could be related to known renal osteodystrophy or metastatic disease. Attention on follow-up imaging is recommended. END OF IMPRESSIONPortable US  doppler vein RIGHT upper extremityResult Date: 9/7/2025No evidence of deep venous thrombosis in the  right upper extremity. Internal jugular could not be evaluated due to large dressing. END OF IMPRESSION Chest single frontal viewResult Date: 9/6/2025Interval placement of a right IJ central venous catheter with tip projecting over the region of the right internal jugular vein. Consider repositioning. No definite pneumothorax. No visualized guidewire. Unchanged layering left pleural fluid and left mid and lower lung opacities. Unchanged pulmonary edema. END OF IMPRESSION Femur RIGHT standard AP and LateralResult Date: 9/6/2025Please see operative note for further procedural details. END OF IMPRESSION Assessment: Shane Pearson is a 63 y.o. male with a PMH of ESRD on hemodialysis, stage IV renal cell carcinoma with intracranial, spinal, and diffuse skeletal metastases, malignant left pleural effusion, CHF, chronic orthostatic hypotension, ischemic colitis, and HLD presented to the ED on 9/3 due to a right midshaft humeral fracture. In his current hospitalization, he has not been found to have any focal neurological deficits and has remained cognitively intact. On physical exam, he appeared tired and weak, but had no focal neurological findings. His R humerus fracture limited him from moving his R arm during examination. Limb movement elicited pain in all extremities. His grip strength was normal bilaterally which leads us  to believe that his weak limb/painful movement is not a neurological problem, rather most likely problematic due to the diffuse bone metastasis. 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 Need to discuss with neuro oncology.Plan: Discuss the leptomeningeal findings on MRI with neuro oncologyPatient seen and examined with Dr. Loree, neurology attendingKelsey Orval, Medical Student, Neurology 11/18/2023 1:50 PM

## 2023-11-18 NOTE — Anesthesia Preprocedure Evaluation (Signed)
 Anesthesia Pre-operative History and Physical for Shane Pearson.63 y.o. 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 acetaminophen  (TYLENOL ) 500 mg, 3 TIMES DAILY PRN . aspirin  81 MG EC tablet 1 tablet, DAILY . atorvastatin  (LIPITOR) 20 mg tablet 1 tablet, DAILY WITH DINNER . b complex-vitamin c-folic acid  (NEPHRO-VITE) tablet 1 tablet, DAILY . calcium -vitamin D  (OSCAL-500) 500-200 MG-UNIT per tablet 1 tablet, 2 TIMES DAILY . cinacalcet  (SENSIPAR ) 30 MG tablet 2 tablets, EVERY EVENING . Commode, adjustable 3-in-1 Use as directed . dexAMETHasone  (DECADRON ) 2 mg, Oral, 2 TIMES DAILY WITH MEALS . fludrocortisone  (FLORINEF ) 0.1 mg tablet 1 tablet, 3 TIMES DAILY . fluticasone  (FLONASE ) 50 MCG/ACT nasal spray 1 spray, DAILY . magnesium  oxide 400 mg, Oral, DAILY . midodrine  (PROAMATINE ) 10 mg tablet 1 tablet, 3 TIMES DAILY . pantoprazole  (PROTONIX ) 40 mg, Oral, EVERY MORNING, Swallow whole. Do not crush, break, or chew. . walker Exact equipment needed: Rolling walker Specific length of time needed: LifetimeICD10 Code: R26.2 Ht: 5' 5   Wt: 175 lbs  Lines/Drains/Airways/Wounds   Line  Duration       Hemodialysis Catheter:  Tunneled Double lumen Left Internal Jugular 831 days  TCVC (Tunneled) 11/18/23 Left Femoral <1 day    Wound  Duration       Wound 11/16/23 Right;Lateral Thigh Incision 2 days  Wound 11/18/23 Right Groin Cath Entry/Exit Site Sheath insertion removal <1 day     .Anesthesia Evaluation Information Source: records   GENERALComment: Failure to thrive CARDIOVASCULAR  + CHFGI/HEPATIC/RENAL   + GERD  + Bowel Issues  + Renal Issues        hemodialysis, ESRD NEURO/PSYCH/ORTHO  + Gait/Mobility issues  Comment: Leg weaknessENDO/OTHER  + Thyroid  DiseaseHEMATOLOGIC  + Blood dyscrasia        hyperlipidemia  + Arthritis        lumbar Physical Exam Not Completed________________________________________________________________________Anes PlanAnesthesia Consent Not Performed [1]Past Medical History:Diagnosis Date . Arthritis  . CHF (congestive heart failure)  . Colon polyp  . Dialysis patient  . ESRD (end stage renal disease)  . GERD (gastroesophageal reflux disease)  . Hypercholesterolemia  . Hypotension  . Ischemic colitis  . Thyroid  disease   hypo [2]Past Surgical History:Procedure Laterality Date . DIALYSIS FISTULA CREATION Bilateral   4 fistulas have failed- ? d/t low BP and small veins . HERNIA REPAIR   . PR LIGATION/BIOPSY TEMPORAL ARTERY Right 10/04/2023  Procedure: TEMPORAL ARTERY BIOPSY RIGHT EYE;  Surgeon: Lynn Ervin Edelman, MD;  [facility] MAIN OR;  Service: Ophthalmology . Right inguinal hernia repair   [3]AllergiesAllergen Reactions . Penicillins Swelling and Rash   Facial swellingMed History Tech discussed Penicillin allergy with patient on 10/18/23. Please see Antimicrobial Stewardship Chart Note for additional details. . Amoxicillin Swelling  and Rash   Facial swellingMed History Tech discussed Amoxicillin allergy with patient on 10/18/23. Please see Antimicrobial Stewardship Chart Note for additional details. . Baclofen Other (See Comments)  confusion . Coconut Nausea And Vomiting . No Known Latex Allergy  [4]Current Facility-Administered Medications: .  senna (SENOKOT) tablet 2 tablet, 2 tablet, Oral, BID, McKelvie, Brianna, PA.  polyethylene glycol (GLYCOLAX ,MIRALAX ) powder 17 g, 17 g, Oral, BID, McKelvie, Brianna, PA.  insulin  regular (HumuLIN  R, NovoLIN R) 100 unit/mL injection 5 units, 5 units, Intravenous, Once, McKelvie, Brianna, PA.  dextrose  50% (0.5 g/mL) injection 25 g, 25 g, Intravenous, Once, McKelvie, Brianna, PA.  dextrose  50% (0.5 g/mL) injection 25 g, 25 g, Intravenous, Q1H PRN, McKelvie, Brianna, PA.  albuterol  (PROVENTIL ) 0.5% (5 mg/mL) nebulizer solution 10 mg, 10 mg, Nebulization, Once, McKelvie, Brianna, PA.  sodium zirconium cyclosilicate  (LOKELMA ) oral packet 10 g, 10 g, Oral, TID, McKelvie, Brianna, PA.  oxyCODONE  (ROXICODONE ) immediate release tablet 5 mg, 5 mg, Oral, Q4H PRN **OR** oxyCODONE  (ROXICODONE ) immediate release tablet 10 mg, 10 mg, Oral, Q4H PRN, McKelvie, Brianna, PA, 10 mg at 11/18/23 0823.  [START ON 11/19/2023] ceFAZolin  (ANCEF ) 2,000 mg in sterile water  (PF) IV syringe 20 mL, 2,000 mg, Intravenous, Every Tues,Thurs & Sat **FOLLOWED BY** [START ON 11/23/2023] ceFAZolin  (ANCEF ) 3,000 mg in sterile water  (PF) IV syringe 30 mL, 3,000 mg, Intravenous, Weekly, McKelvie, Brianna, PA.  atorvastatin  (LIPITOR) tablet 20 mg, 20 mg, Oral, Daily with dinner, Alise Bonnet, MD, 20 mg at 11/17/23 8177.  B complex-vitamin C-folic acid  (NEPHRO-VITE) tablet 1 tablet, 1 tablet, Oral, Daily, Alise Bonnet, MD, 1 tablet at 11/18/23 [phone].  cinacalcet  (SENSIPAR ) tablet 60 mg, 60 mg, Oral, QPM, Shaikh, Bonnet, MD, 60 mg at 11/17/23 2039.  dexAMETHasone  (DECADRON ) tablet 2 mg, 2 mg, Oral,  BID WC, Alise Bonnet, MD, 2 mg at 11/18/23 9176.  fludrocortisone  (FLORINEF ) tablet 100 mcg, 100 mcg, Oral, TID, Alise Bonnet, MD, 100 mcg at 11/18/23 9176.  fluticasone  (FLONASE ) 50 MCG/ACT nasal spray 1 spray, 1 spray, Nasal, Daily, Alise Bonnet, MD, 1 spray at 11/18/23 9175.  magnesium  oxide (MAG-OX) tablet 400 mg, 400 mg, Oral, Daily, Alise Bonnet, MD, 400 mg at 11/18/23 9176.  midodrine  (PROAMATINE ) tablet 10 mg, 10 mg, Oral, TID, Alise Bonnet, MD, 10 mg at 11/18/23 0824.  pantoprazole  (PROTONIX ) EC tablet 40 mg, 40 mg, Oral, QAM, Alise Bonnet, MD, 40 mg at 11/18/23 9177.  diphenydraMINE-lidocaine -maalox (BMX/First Mouthwash) compound suspension, 15 mL, Mouth/Throat, Q4H PRN, Alise Bonnet, MD.  melatonin tablet 3 mg, 3 mg, Oral, QHS PRN, Alise Bonnet, MD.  acetaminophen  (TYLENOL ) tablet 1,000 mg, 1,000 mg, Oral, TID, Alise Bonnet, MD, 1,000 mg at 11/18/23 9176.  heparin  (porcine) 1,000 units/mL injection 0-5,000 units, 0-5 mL, Intracatheter, PRN, Alise Bonnet, MD, 1,800 units at 11/16/23 1238.  heparin  (porcine) 1,000 units/mL injection 0-5,000 units, 0-5 mL, Intracatheter, PRN, Alise Bonnet, MD, 1,800 units at 11/16/23 1237.  sodium chloride  0.9 % FLUSH REQUIRED IF PATIENT HAS IV, 0-500 mL/hr, Intravenous, PRN, Alise Bonnet, MD.  dextrose  5 % FLUSH REQUIRED IF PATIENT HAS IV, 0-500 mL/hr, Intravenous, PRN, Alise Bonnet, MD.  naloxone  (NARCAN ) 0.4 mg/mL injection 0.08 mg, 0.08 mg, Intravenous, PRN, Alise Bonnet, MD

## 2023-11-18 NOTE — Invasive Procedure Plan of Care (Cosign Needed)
 CONSENT FOR MEDICALOR SURGICAL PROCEDURE                            Patient Name: Shane Pearson 89 MR                                                              DOB: [birth year]     Please read this form or have someone read it to you. It's important to understand all parts of this form. If something isn't clear, ask us  to explain. When you sign it, that means you understand the form and give us  permission to do this surgery or procedure. I agree for Cesario Donnice Bars, MD , and Vascular and Interventional Radiology Attendings, Fellows, Residents and Advanced Practice Providers. along with any assistants* they may choose, to treat the following condition(s): Humerus lesion By doing this surgery or procedure on me: Imaging assessment of blood vessels. Blood vessel dilation, stenting or occlusion off of the vessel may also be performed if necessary. This is also known as: Angiogram. Possible embolization/angioplasty/stenting/thrombolysis. Laterality: Right *if you'd like a list of the assistants, please ask. We can give that to you.1. The care provider has explained my condition to me. They have told me how the procedure can help me. They have told me about other ways of treating my condition. I understand the care provider cannot guarantee the result of the procedure. If I don't have this procedure, my other choices are: Not to perform the procedure.2. The care provider has told me the risks (problems that can happen) of the procedure. I understand there may be unwanted results. The risks that are related to this procedure include: Bleeding, infection, allergic reaction to contrast dye, injury to vessels,  injury to surrounding organs and structures, dislodgment of embolic material to unintended areas of the body, and in very rare circumstances death.3. I understand that during the procedure, my care provider may find a condition that we didn't know about before the  treatment started. Therefore, I agree that my care provider can perform any other treatment which they think is necessary and available.4. I give permission to the hospital and/or its departments to examine and keep tissue, blood, body parts, fluids or materials removed from my body during the procedure(s) to aid in diagnosis and treatment, after which they may be used for scientific research or teaching by appropriate persons. If these materials are used for science or teaching, my identity will be protected. I will no longer own or have any rights to these materials regardless of how they may be used.5. My care provider might want a representative from a medical device company to be there during my procedure. I understand that person works for:        The ways they might help my care provider during my procedure include:      6. Here are my decisions about receiving blood, blood products, or tissues. I understand my decisions cover the time before, during and after my procedure, my treatment, and my time in the hospital. After my procedure, if my condition changes a lot, my care provider will talk with me again about receiving blood or blood products. At that time, my care provider might need me to review  and sign another consent form, about getting or refusing blood.I understand that the blood is from the community blood supply. Volunteers donated the blood, the volunteers were screened for health problems. The blood was examined with very sensitive and accurate tests to look for hepatitis, HIV/AIDS, and other diseases. Before I receive blood, it is tested again to make sure it is the correct type.My chances of getting a sickness from blood products are small. But no transfusion is 100% safe. I understand that my care provider feels the good I will receive from the blood is greater than the chances of something going wrong. My care provider has answered my questions about blood  products.Moderate Sedation:The moderate sedation procedure has been explained to me as outlined below and my questions/concerns regarding the sedation have been answered/addressed to my satisfaction.I understand that alternatives to receiving moderate sedation for this procedure include not undergoing the procedure, or undergoing the procedure without sedation. If neither of these options is acceptable, there may be other clinical options that I can discuss with my healthcare provider. I understand that moderate sedation is a state of altered consciousness that is usually created by the administration of intravenous medications (medications I.V.). Patients under moderate sedation often feel sleepy and very relaxed, but will respond to commands. The purpose of moderate sedation is to make me comfortable during my procedure. This can also make the procedure easier to perform. I understand that while many patients undergoing moderate sedation will have little to no memory of the sedation or the procedure, awareness during the procedure is possible. Again, the goal is to make me comfortable. I understand that I will be carefully monitored throughout my sedation and procedure to keep me safe. I will not be discharged until the effects of my sedation have worn off sufficiently for me to stay safe. I must not drive, operate dangerous machinery or make important decisions until the next day following my sedation, and I must be accompanied by a responsible adult when I go home.I understand that no medical treatment/procedure is without risk, and this includes moderate sedation, although risks are rare. These include excessive sedation which may require special procedures to keep me safe. I understand some patients may experience nausea and vomiting after sedation. Rarely, patients develop unstable heart rhythms or breathing difficulties, which can lead to death in very rare circumstances. By signing this  document I acknowledge that I have read this consent and had the opportunity to discuss this with my healthcare provider. I also agree to allow my provider to perform any procedure he/she deems necessary to keep me safe during my sedation. My decisionabout blood orblood products   My decision about tissueImplants  I understand thisform.My care provideror his/herassistants haveexplained: What I am having done and why I need it.What other choices I can make instead of having this done.The benefits and possible risks (problems) to me of having this done.The benefits and possible risks (problems) to me of receiving transplants, blood, or blood products.There is no guarantee of the results.The care provider may not stay with me the entire time that I am in the operating or procedure room.My provider has explained how this may affect my procedure. My provider has answered myquestions about this. I give mypermission forthis surgery orprocedure.        _________________________________________  My signature(or parent or other person authorized to sign for you, if you are unable to sign for yourself or if you are under 8 years old)       _____________   Date    (MM/DD/YYYY)     _______  Time  Electronic Signatures will display at the bottom of the consent form.Care provider's statement: I have discussed the planned procedure, including the possibility for transfusion of bloodproducts or receipt of tissue as necessary; expected benefits; the possible complications and risks; and possible alternativesand their benefits and risks with the patients or the patient's surrogate. In my opinion, the patient or the patient's surrogateunderstands the proposed procedure, its risks, benefits and alternatives.      Electronically signed by: Jonette Vannie Severs, MD                                           11/18/2023       Date    12:50 PM      Time

## 2023-11-18 NOTE — Invasive Procedure Plan of Care (Signed)
 [facility]                            Patient Name: Shane Pearson 89 MR                                                              DOB: [birth year]     Please read this form or have someone read it to you. It's important to understand all parts of this form. If something isn't clear, ask us  to explain. When you sign it, that means you understand the form and give us  permission to do this surgery or procedure. I agree for Orthopaedic Trauma Surgery Attending along with any assistants* they may choose, to treat the following condition(s): pathologic right  humerus fracture  By doing this surgery or procedure on me: right humerus closed versus open reduction, internal versus external fixation, biopsy, all indicated procedures This is also known as: Fixing the broken bone, taking a sample of the lesion Laterality: right1. The care provider has explained my condition to me. They have told me how the procedure can help me. They have told me about other ways of treating my condition. I understand the care provider cannot guarantee the result of the procedure. If I don't have this procedure, my other choices are: No Surgery; Non-operative management2. The care provider has told me the risks (problems that can happen) of the procedure. I understand there may be unwanted results. The risks that are related to this procedure include: - Bleeding- Infection- Damage to surrounding structures (muscles, tendons, nerves, blood vessels), - Failure of the bone to heal,- Failure of the wound to heal, - Hardware failure, - Need for further surgery- Persistent pain- Risks of anesthesia (including but not limited to heart attack, stroke and death)- Deep vein thrombosis- Pulmonary embolus3. I understand that during the procedure, my care provider may find a condition that we didn't  know about before the treatment started. Therefore, I agree that my care provider can perform any other treatment which they think is necessary and available.4. I understand the care provider may remove tissue, body parts, or materials during this procedure. These materials may be used to help with my diagnosis and treatment. They might also be used for teaching purposes or for research studies that I have separately agreed to participate in. Otherwise they will be disposed of as required by law.5. My care provider might want a representative from a medical device company to be there during my procedure. I understand that person works for:  [employer], Publishing copy, [employer], Publishing rights manager      The ways they might help my care provider during my procedure include:      Helping the operating room staff prepare the special device my care provider wants to use and Providing information and support to operating room staff regarding the device6. Here are my decisions about receiving blood, blood products, or tissues. I understand my decisions cover the time before, during and after my procedure, my treatment, and my time in the hospital. After my procedure, if my condition changes a lot, my care provider will talk with me again about receiving blood or blood products. At that time, my  care provider might need me to review and sign another consent form, about getting or refusing blood.I understand that the blood is from the community blood supply. Volunteers donated the blood, the volunteers were screened for health problems. The blood was examined with very sensitive and accurate tests to look for hepatitis, HIV/AIDS, and other diseases. Before I receive blood, it is tested again to make sure it is the correct type.My chances of getting a sickness from blood products are small. But no transfusion is 100% safe. I understand that my care provider feels the good I will receive from the blood is  greater than the chances of something going wrong. My care provider has answered my questions about blood products.My decisionabout blood orblood products Yes, I agree to receive blood or blood products if my care provider thinks they're needed.  My decision about tissueImplants Yes, I agree to receive tissue implants if my care provider thinks they're needed. - tissues may include bone graft. I understand thisform.My care provideror his/herassistants haveexplained: What I am having done and why I need it.What other choices I can make instead of having this done.The benefits and possible risks (problems) to me of having this done.The benefits and possible risks (problems) to me of receiving transplants, blood, or blood products.There is no guarantee of the results.The care provider may not stay with me the entire time that I am in the operating or procedure room.My provider has explained how this may affect my procedure. My provider has answered myquestions about this. I give mypermission forthis surgery orprocedure.        _______________________________________________                                   My signature(or parent or other person authorized to sign for you, if you are unable to sign foryourself or if you are under 10 years old)    9/8/2025______         Date 9:41 AM   _____      Time Electronic Signatures will display at the bottom of the consent form.Care provider's statement: I have discussed the planned procedure, including the possibility for transfusion of bloodproducts or receipt of tissue as necessary; expected benefits; the possible complications and risks; and possible alternativesand their benefits and risks with the patients or the patient's surrogate. In my opinion, the patient or the patient's surrogateunderstands the proposed procedure, its risks, benefits and alternatives.       Electronically signed by: Herb File, MD                     11/18/2023      Date    9:41 AM      Time Your doctor or someone your doctor has appointed has told you that you may need blood or a blood product transfusion, which has been collected from volunteers, as part of your treatment as a patient.  The reasons you might need blood or blood products include, but are not limited to:  Significant loss of your own blood Your body may not be getting enough oxygen to its tissues  Treatment of bleeding disorders caused by low platelet counts or platelets that do not work right (platelets are part of a cell that helps to form clots and keeps you from bleeding too much). You may not have enough of other substances that help your blood clot or stop you from  bleeding moreThe risks of getting a transfusion of blood or blood products include, but are not limited to:  Damage to the lungs Difficulty breathing due to fluid in the lungs The product may contain bacteria or rarely a virus (which includes HIV and Hepatitis) Blood from the community blood supply has been collected from volunteer donors who have been screened for health risk. The blood has been tested for major blood transmitted disease, but no transfusion is 100% safe. The blood is tested with very sensitive and accurate tests to screen for hepatitis, AIDS, and other disease, which makes the risks very small. You may have side effects from the transfusion (rash, fever, chills) or an allergic reaction The transfusion increases your risks of getting infection or cancer coming back The transfusion can increase the time you have to stay in the hospital The transfusion can potentially cause death if the wrong blood is given or your body rejects the blood Before blood is transfused, it is tested again to make sure it is the correct typeThere are other options than getting blood or blood  products from other people and they include:  Drugs which can decrease bleeding Drugs which can cause your body to make more blood (used in elective procedures with advance notice) Autologous (your own blood) donation (needs pre-arrangement) No transfusionIf you exercise your right to refuse to be transfused with blood or blood products; these things listed below, among others, could happen to you: Your body may not get enough oxygen and suffer damage You may have a higher chance of bleeding You may limit other options for your condition You may die from losing too much blood

## 2023-11-18 NOTE — Significant Event (Addendum)
 Called to see patient for manual BP 72/40.In to see patient. Shane reports feeling tired from a busy day but denies dizziness, lightheadedness, chest pain, or SOB. Thinks his BP is low since he's been NPO frequently this admission and hasn't eaten enough. Confirmed full code status. Wife at bedsidePhysical exam:Fatigued, falls asleep during conversation, chronically ill appearing, dry mucous membranesRRR, no m/r/gCTA, breathing comfortably on room airFaint radial pulse. Brachial flow noted with dopplerSBP 64 with dopplerPatient Vitals: BP Temp Temp src Pulse Resp SpO2 11/18/23 2300 (!) 64/0 --36.2 -- --95 --16 --98% 11/18/23 2243 (!) 72/40 -- -- -- -- -- 11/18/23 2234 (!) 71/39 -- -- -- -- -- 11/18/23 2003 (!) 84/58 -- -- -- -- -- 11/18/23 1947 (!) 80/54 -- -- -- -- -- 11/18/23 1943 (!) 72/52 35.4 C (95.7 F) TEMPORAL -- -- 93 % 11/18/23 1907 -- -- -- -- -- 93 % 11/18/23 1826 (!) 81/45 35.8 C (96.4 F) TEMPORAL 96 -- 99 % 11/18/23 1800 (!) 89/51 -- -- -- -- 96 % 11/18/23 1730 96/62 36.3 C (97.3 F) TEMPORAL -- 17 95 % 11/18/23 1715 (!) 85/54 36.5 C (97.7 F) TEMPORAL -- 17 93 % 11/18/23 1700 (!) 130/111 36.5 C (97.7 F) TEMPORAL -- 17 96 % 11/18/23 1645 (!) 126/111 36.5 C (97.7 F) TEMPORAL -- 20 97 % 11/18/23 1630 (!) 107/91 36.5 C (97.7 F) TEMPORAL 95 20 92 % 11/18/23 1620 92/67 -- -- 94 15 94 % 11/18/23 1610 90/67 -- -- 91 16 96 % 11/18/23 1600 101/61 -- -- 91 18 96 % 11/18/23 1550 101/61 -- -- 91 19 95 % 11/18/23 1540 95/53 -- -- 91 14 96 % 11/18/23 1530 103/63 -- -- 91 15 97 % 11/18/23 1520 97/63 -- -- 91 14 97 % 11/18/23 1510 92/54 -- -- 91 15 97 % 11/18/23 1500 91/77 -- -- 90 16 96 % 11/18/23 1450 98/57 -- -- 91 17 96 % 11/18/23 1440 (!) 87/52 -- -- 92 14 96 % 11/18/23 1435 92/74 -- -- 92 12 97 % 11/18/23 1430 92/74 -- -- 91 14 96 % 11/18/23 1420 (!) 84/66 -- -- 92 15 96 % 11/18/23 1415 (!) 86/51 -- -- 92 14 97 %  11/18/23 1410 112/81 -- -- 94 15 98 % 11/18/23 1405 91/54 -- -- 93 12 96 % 11/18/23 1240 (!) 115/31 36.5 C (97.7 F) TEMPORAL -- 17 (!) 89 % 11/18/23 1207 94/56 35.8 C (96.4 F) TEMPORAL 100 18 94 % 11/18/23 0824 94/56 35.5 C (95.9 F) TEMPORAL 92 18 93 % 11/18/23 0354 (!) 89/52 35.2 C (95.4 F) TEMPORAL 94 18 92 % A/P: Shane Pearson 63 yo M with RCC and ESRD on HD admitted with R humerus fracture and 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 Now with worsening hypotension concerning for shockAcute on chronic hypotension- 1L NS bolus with plan for total 30cc/kg if no improvement- blood cultures with lactate. Hold antibiotics for now- random cortisol- CMP, CBC, VBG- EKG- call ICU if no improvement with 30cc/kg crystalloidRebecca Laramie Gelles, NP09/10/2509:14 PM

## 2023-11-18 NOTE — Procedures (Signed)
 ProceduresProcedure ReportCENTRAL LINE INSERTION NOTE Time out documentation completed in Procedure navigator prior to procedure:  YesIndications  Administration of Medication and Blood DrawsConsent signed: Yes: discussed potential complications including bleeding, pain, infection, damage to surrounding structures. PneumothoraxPre-Procedure Interventions:  Patient placed on bedside cardiac monitor. Patient's ability to safely lie flat assessed.Vessel accessed was evaluated prior to the procedure to assess for patency and free of thrombus. Images were loaded in [facility]. Right IJ central line removed. Right IJ POCUS assessment demonstrated a small thrombus floating in the center of the vessel. Primary team made aware. Discussed proper site for line placement. Instructed to place in the left femoral vein. Procedure Details  1% lidocaine  S.Q. 5 mL totalVein initially entered with:  18G catheterTechnique:  ultrasound and with wire guideCorrect placement confirmed by: ultrasound. Images loaded in [facility] cloud.Ectopy: noVein cannulated with: Size: 72F Length(cm): 20 Lumen: Triple Coating: Sulfadiazine/ChlorhexidineInsertion depth: 20 cmSecured by: suturing and bioocclusive dressingSuccessful placement: YesVenous Site: femoralAttempted Sites: Left subclavianGuide Wire Removed Intact : yesComplications  noneCondition  Pt tolerated procedure well. YesPatient's condition at end of procedure: no change from priorPlan/Orders  Chest X-Ray: not indicatedImages can be found in the [facility] May use catheter EBL  minimalDisposition  Continue care per primary teamGeorge JONETTA Gilles, NP Hospital Medicine Procedure [facility] page [facility] [phone] with any questions/concerns.

## 2023-11-18 NOTE — Progress Notes (Signed)
 Assumed care of patient 0300-1500. Patient hypotensive at times throughout shift. All other vital signs stable throughout shift. Patient complaint of right foot pain, prn oxycodone  given with positive effect. Patient's RUE remained non weight bearing and in a sling throughout shift. Report given to oncoming nurse for continuity of care. Please see doc flowsheets and eMAR for additional details. Camie Maudlin, RN

## 2023-11-18 NOTE — Progress Notes (Signed)
 Interventional Radiology Pre-Procedure Handoff/ChecklistNPO:  YesTube feed Stopped: N/AAnticoagulants:noneTelemetry: NoTransport mode: Bed  Number of Transporters needed: 2  Hover Mat:  YesIs patient changed into a hospital gown? YesReminded floor RN to have patient remove jewelry and leave valuables on the unit: YesIV access: Hemodialysis Catheter:  Tunneled Double lumen Left Internal Jugular (Active) Insertion Site Description Clean;Dry 11/18/23 0824 Phlebitis Scale Grade 0 11/18/23 0824 Infiltration Scale Grade 0 11/18/23 0824 Proximal Lumen Status Heparin  locked 11/16/23 1619 Distal Lumen Status Heparin  locked 11/16/23 1619 Dressing Type Transparent;Transparent CHG 11/18/23 0824 Dressing Status Clean, dry, and intact 11/18/23 0824 Dressing Change New 11/14/23 1710 (T)Transparent Drsg Change- (Every 7 Days) Dressing changed 11/14/23 1710 Dressing Change Due 11/21/23 11/18/23 0824 Heparin /TPA Dwell Yes - Heparin  11/16/23 1619 Freq of line accesses and lab draws addressed? Yes 11/16/23 1238 Extravasation No 11/16/23 1238 Comments dressing c/d/i no s/s of infection 11/14/23 1710   PCVC (Percutaneous) 11/16/23 Double 8 fr Right Internal jugular (Active) Insertion Site Description Clean;Dry 11/18/23 0824 Phlebitis Scale Grade 0 11/18/23 0824 Infiltration Scale Grade 0 11/18/23 0824 Proximal Lumen Status Other (Comment) 11/18/23 0400 Distal Lumen Status Other (Comment) 11/18/23 0400 Dressing Type Transparent;Transparent CHG 11/18/23 0824 Dressing Status Clean, dry, and intact 11/18/23 0824 Dressing Change New 11/16/23 1800 Dressing Change Due 11/23/23 11/18/23 0824 Bag to hub changed? No 11/18/23 0824 Next bag to hub change due 11/19/23 11/18/23 0824 Needleless Access Device(s) Changed? No 11/18/23 0824 Needleless Access Device(s) Change Due 11/19/23 11/18/23 0824 **Need for continuing central line addressed? Medically indicated 11/18/23  0824 Respiratory: Room AirConsentable:yesAlert and Oriented to person, place and time?: yesCode Status: Full CodeDoes patient wear an insulin  pump? N/APrecautions:noneAllergies: Allergies[1]Shane Misiaszek, RN Received Handoff Report from Camie for IR procedure 10:31 AM [1] AllergiesAllergen Reactions  Penicillins Swelling and Rash   Facial swellingMed History Tech discussed Penicillin allergy with patient on 10/18/23. Please see Antimicrobial Stewardship Chart Note for additional details.  Amoxicillin Swelling and Rash   Facial swellingMed History Tech discussed Amoxicillin allergy with patient on 10/18/23. Please see Antimicrobial Stewardship Chart Note for additional details.  Baclofen Other (See Comments)   confusion  Coconut Nausea And Vomiting  No Known Latex Allergy

## 2023-11-18 NOTE — Progress Notes (Signed)
[facility] continues to follow this patient's hospital course for home care needs.  Please call for questions/concerns regarding the discharge plan.    Floy Sabina RN [facility] [phone]  [facility]   After hours and on weekends/Holidays [phone]

## 2023-11-18 NOTE — Progress Notes (Addendum)
 [facility]  Subjective:NAE. Pressures remain 90/40s Pain well controlled. Tolerating PO. Denies fevers, chills, chest pain, shortness of breath, nausea, or vomiting. HCT 34 (34). Intra-op pathology remains pending. Pending IR embolization today and Humerus fixation for tomorrow ObjectiveLast Filed Vitals  11/18/23 0354 BP: (!) 89/52 Pulse: 94 Resp: 18 Temp: 35.2 C (95.4 F) SpO2: 92% General: NAD, Pleasant, AppropriateRespiratory: Breathing comfortably, No evidence of respiratory distressCardiac: RRRRUE exam: Sling in place. Intact AIN/PIN.Ulnar n motor function. SILT along the 1st DWS, deltoid, volar index/small fingers. Digits WWP.right lower extremity ExamDressing clean, dry, intact. Motor intact  ankle dorsiflexion/plantarflexion, toe flexion/extension.Sensation intact to light touch 1st dorsal web space/medial/lateral/plantar/dorsal foot. 2+ dorsalis pedis/posterior tibialis pulse, toes warm and well-perfused, < 3 second capillary refill.LLE Exam: Motor intact  ankle dorsiflexion/plantarflexion, toe flexion/extension.Sensation intact to light touch 1st dorsal web space/medial/lateral/plantar/dorsal foot. 2+ dorsalis pedis/posterior tibialis pulse, toes warm and well-perfused, < 3 second capillary refill.Recent Labs Lab 09/07/250248 09/06/250820 09/05/250151 WBC 12.1* 11.2*  11.2* 10.5 Hemoglobin 9.9* 10.1*  10.1* 10.1* Hematocrit 34* 34*  34* 33* Platelets 468* 498*  530* 428 Recent Labs Lab 09/07/251039 09/07/250248 09/06/250820 Sodium 136 137 134 Potassium 5.1 5.2* 4.9 Chloride 94* 94* 90* CO2 23 24 23  No components found with this basename: BUN, LABGLOM, CALCIUMRecent Labs Lab 09/06/250820 09/03/252134 INR 1.1 1.2* No results for input(s): ESR, CRP in the last 168 hours.Imaging: NoneAssessment/Plan: Shane Pearson is a 63  y.o. male POD  2  s/p R femur prophylactic CMN on 11/18/2023. Also with R proximal humerus pathologic fracture  and L femur lesions pending stabilization. Planned return to operating room: Yes tentative for Tuesday (09/09)  for R Humerus with ortho oncology following embolization of right proximal humerus lesion by IR on Monday (09/08)Please make patient NPO Midnight with Iv fluids if able, Collect, CBC, BMP, PT INR, Type and ScreenConsent and Boarding pendingAntibiotic regimen:  periop ancef  2 doses completePain Management: MMDVT prophylaxis and duration: per primary rec Lovenox  if ableWeight bearing status and activity restrictions: WBAT RLE, WBAT LLE W/ walker, NWB RUE in sling Wound/Pin Care: POD7 Mepilex change RLEPostoperative x-rays and radiographic studies: NoneDiet: ADAT Foley:  N.a Additional Specifics (as needed):  F.u reaming pathologyAnticipated  follow-up date: 2 weeks for right hipHashim Alise, MDOrthopaedic Surgery9/10/2023 6:10 AM

## 2023-11-18 NOTE — Interval H&P Note (Signed)
 UPDATES TO PATIENT'S CONDITION on the DAY OF SURGERY/PROCEDUREI. Updates to Patient's Condition (to be completed by a provider privileged to complete a H&P, following reassessment of the patient by the provider):Day of Surgery/Procedure Update:History(Inpatients only): I confirm that progress notes within the past 24 hours document updates to the patient's condition.Physical(Inpatients only): I confirm that progress notes within the past 24 hours document updates to the patient's condition.Pre-Procedure AssessmentAirway Visibility: Soft palateLoose/Broken Teeth, Oral Piercings: Not ApplicableLungs: ClearHeart sounds: Rate: 91 bpmHeart sounds rhythm: Regular Rate RhythmASA Physical status rating: Class III: Severe systemic disease II. Procedure Readiness I have reviewed the patient's H&P and updated condition. By completing and signing this form, I attest that this patient is ready for surgery/procedure.III. Attestation I have reviewed the updated information regarding the patient's condition and it is appropriate to proceed with the planned surgery/procedure.Donnice Taft Flatten, MD as of 1:50 PM 11/18/2023

## 2023-11-18 NOTE — Plan of Care (Signed)
 Medicine Procedure Team Non Tunneled Right IJ Central Line Removal Note Noted order for bedside Non tunneled central line removal. Chart reviewed.  Patient explained and prepared for the procedure.HOB put into flat position.Dressing removed, sutures removed. Patient instructed to hum during removal. On command and during patient humming the line was removed and direct pressure was applied. The [facility] was then placed in high fowlers position. Line noted to be intact. Pressure was held for 5 minute, until hemostasis was obtained. Entry site cleaned with chlorhexidine and covered with DSD and tegaderm. Nurses instructed to reassess site.  Zachary JONETTA Gilles, NPHospital Medicine APP Procedure Team 12/29/17 3:15 PM Please page PIC [phone] with any questions/concerns.

## 2023-11-18 NOTE — Invasive Procedure Plan of Care (Signed)
 CONSENT FOR MEDICALOR SURGICAL PROCEDURE                            Patient Name: Shane Pearson 89 MR                                                              DOB: 1961-01-05     Please read this form or have someone read it to you. It's important to understand all parts of this form. If something isn't clear, ask us  to explain. When you sign it, that means you understand the form and give us  permission to do this surgery or procedure. I agree for Deette Zachary BIRCH, NP , and Members of the Procedure Team (Attendings, Advanced Practice providers, Residents and medical students) along with any assistants* they may choose, to treat the following condition(s): Need for central access, IV medications, blood products, and lab draws By doing this surgery or procedure on me: central line placement  This is also known as: central line placement  Laterality: *if you'd like a list of the assistants, please ask. We can give that to you.1. The care provider has explained my condition to me. They have told me how the procedure can help me. They have told me about other ways of treating my condition. I understand the care provider cannot guarantee the result of the procedure. If I don't have this procedure, my other choices are: No Line placement 2. The care provider has told me the risks (problems that can happen) of the procedure. I understand there may be unwanted results. The risks that are related to this procedure include: Bleeding, infection, damage to surrounding structures, pneumothorax, arterial injury or unsuccessful 3. I understand that during the procedure, my care provider may find a condition that we didn't know about before the treatment started. Therefore, I agree that my care provider can perform any other treatment which they think is necessary and available.4. I give permission to the hospital and/or its departments to examine and keep tissue, blood, body  parts, fluids or materials removed from my body during the procedure(s) to aid in diagnosis and treatment, after which they may be used for scientific research or teaching by appropriate persons. If these materials are used for science or teaching, my identity will be protected. I will no longer own or have any rights to these materials regardless of how they may be used.5. My care provider might want a representative from a medical device company to be there during my procedure. I understand that person works for:        The ways they might help my care provider during my procedure include:      6. Here are my decisions about receiving blood, blood products, or tissues. I understand my decisions cover the time before, during and after my procedure, my treatment, and my time in the hospital. After my procedure, if my condition changes a lot, my care provider will talk with me again about receiving blood or blood products. At that time, my care provider might need me to review and sign another consent form, about getting or refusing blood.I understand that the blood is from the community blood supply. Volunteers donated the blood, the volunteers were screened  for health problems. The blood was examined with very sensitive and accurate tests to look for hepatitis, HIV/AIDS, and other diseases. Before I receive blood, it is tested again to make sure it is the correct type.My chances of getting a sickness from blood products are small. But no transfusion is 100% safe. I understand that my care provider feels the good I will receive from the blood is greater than the chances of something going wrong. My care provider has answered my questions about blood products.My decisionabout blood orblood products   My decision about tissueImplants  I understand thisform.My care provideror his/herassistants haveexplained: What I am having done and why I need  it.What other choices I can make instead of having this done.The benefits and possible risks (problems) to me of having this done.The benefits and possible risks (problems) to me of receiving transplants, blood, or blood products.There is no guarantee of the results.The care provider may not stay with me the entire time that I am in the operating or procedure room.My provider has explained how this may affect my procedure. My provider has answered myquestions about this. I give mypermission forthis surgery orprocedure.        _________________________________________                                   My signature(or parent or other person authorized to sign for you, if you are unable to sign for yourself or if you are under 63 years old)       _____________   Date    (MM/DD/YYYY)     _______  Time  Electronic Signatures will display at the bottom of the consent form.Care provider's statement: I have discussed the planned procedure, including the possibility for transfusion of bloodproducts or receipt of tissue as necessary; expected benefits; the possible complications and risks; and possible alternativesand their benefits and risks with the patients or the patient's surrogate. In my opinion, the patient or the patient's surrogateunderstands the proposed procedure, its risks, benefits and alternatives.      Electronically signed by: Zachary JONETTA Gilles, NP                                          11/18/2023       Date    10:36 AM      Time

## 2023-11-18 NOTE — Procedures (Signed)
 Procedure Report Time out documentation completed prior to procedure:  YesIndications/Pre-Procedure diagnosis:  Pathologic R humerus FxProcedure performed:  Right upper extremity angiogram and embolizationGuide Wire(s) removed and inspected for integrity:YesFindings/Procedure Summary (detailed report located in the Image tab): 51fr RCFA access. Angiogram with mild tumoral blush along fracture margins. Preoperative bland embolization using 300-500um beads where able tp and coil embolization where significant venous shunting was present. Greatly reduced tumoral blush on completion angiogram. Mynx closure of arteriotomyComplications:  noneCondition:  goodEBL: <10ccSpecimens:  noOperators: Dr. Donnice Flatten and Dr. Jonette SchoeneckDisposition:  WCC7Post-Procedure Diagnosis: samePlan:-Bedrest with RLE straight for 2 hrs post angiogramMason Vannie Severs, MD  11/18/2023  4:47 PM

## 2023-11-18 NOTE — Progress Notes (Signed)
 [facility] Nursing Procedure Johny Pitstick  [id number] Procedure (as described by patient): embolization    Status:Completed. Patient tolerated procedure wellSpecimen collection:noSponge count:N/AFluid removed:NoProcedure dressing site location:Right GroinDressing type:sterile gauze and tegaderm and DermabondDressing status:Clean, dry and intact Hematoma:Not evidentTube labeled:N/AMedication received:Fentanyl  , Lidocaine  10 ml, and Contrast of Omni350Cardiovascular: Peripheral Pulses present post procedure and unchanged from baseline Implant patient information given to patient or parent/guardian:YesRadiation overexposure: NoReport given un:Ejupzwud recovery nurse. Name: Olam and Unit Nurse. [facility] Name KelseyLast Filed Vitals  11/18/23 1645 BP: (!) 126/111 Pulse:  Resp: 20 Temp: 36.5 C (97.7 F) SpO2: 97%

## 2023-11-18 NOTE — Plan of Care (Shared)
 [facility] SURGERY PLAN OF CARE NOTE Patient:Shane LytleMRN: [id number] DOA: 9/3/2025Plan to take patient to OR tomorrow for Right humerus fixation and biopsy. Please ensure the following below is complete. [facility] for Right humerus fixation and biopsy, pending HM/primary team optimizationNPO + IVF at midnightPre-op labs: BMP, CBC, PT/INR, Type and ScreenPlease ensure he is 1st session dialysis in morning of 09/09Pain controlWeight-bearing status: NWB RUE, Bedrest, ice/elevate  DVT prophylaxis do not hold pre-operativelyHashim Alise, MDOrthopaedic Surgery Resident9/10/2023 1:05 PM

## 2023-11-18 NOTE — Progress Notes (Signed)
 Hematology Oncology Hospitalist Progress Note  LOS: 5 days  Full Code Significant Events/Subjective: Meeting patient for the first time, he was seen and examined this morning with covering APP.  Says he feels fine, pain well-controlled.  Right IJ was malfunctioning and per extensive discussion with medical procedure team and orthopedics this morning and ultimately decided to place a left femoral line.  Taken to IR this afternoon for embolization.  Planning for HD tomorrow morning followed by OR for repair of his right humeral fracture.  He is medically optimized for surgery tomorrow once he completes HD in the morning.Also discussed MRI findings with Dr. Virgia and Dr. Bronson this morning. Concern for lepto which was conveyed to patient and wife (via phone this afternoon). Tentative plan for WBRT. Dr. Virgia to see patient tomorrow (if able between procedures). Will also consult neuro-oncology. Review of Systems Respiratory:  Negative for shortness of breath.  Cardiovascular:  Negative for chest pain and palpitations. Gastrointestinal:  Negative for abdominal pain, nausea and vomiting. Objective: Physical ExamTemp:  [35.2 C (95.4 F)-36.5 C (97.7 F)] 36.5 C (97.7 F)Heart Rate:  [91-105] 91Resp:  [12-20] 17BP: (80-115)/(31-81) 98/57 Physical ExamConstitutional:     Comments: Pleasant gentleman lying in bed in NAD  Cardiovascular:    Rate and Rhythm: Normal rate and regular rhythm. Pulmonary:    Effort: Pulmonary effort is normal.    Breath sounds: Normal breath sounds.    Comments: Breathing comfortably on RAMusculoskeletal:    Comments: RUE with edema but able to wiggle fingers, in sling Skin:   General: Skin is warm and dry. Neurological:    General: No focal deficit present.    Mental Status: He is alert. Recent Lab Studies:Personally reviewed and notable qnm:[facility] Labs Lab 09/08/251214 09/07/250248 09/06/250820  WBC 13.9* 12.1* 11.2*  11.2* Hemoglobin 9.8* 9.9* 10.1*  10.1* Hematocrit 33* 34* 34*  34* Platelets 607* 468* 498*  530*  Recent Labs Lab 09/08/251214 09/07/251039 09/07/250248 09/06/250820 09/05/250151 09/04/250625 09/03/252134 Sodium 133 136 137 134   < > 132*  --  Potassium 5.9* 5.1 5.2* 4.9   < > 5.6*  --  Chloride 91* 94* 94* 90*   < > 87*  --  CO2 20 23 24 23    < > 20  --  UN 50* 34* 29* 52*   < > 56*  --  Creatinine 6.31* 4.63* 4.12* 6.27*   < > 7.53*  --  Glucose 83 88 98 87   < > 82  --  Calcium  8.3* 8.0* 8.1* 7.6*   < > 7.9*  --  Albumin  --   --   --   --   --  3.2* 3.3* Phosphorus 5.6*  --  5.1* 4.7*   < > 5.6*  --   < > = values in this interval not displayed. Recent Labs Lab 09/08/251214 09/06/250820 09/03/252134 INR 1.4* 1.1 1.2* Protime 15.6* 12.5 13.3* PATHOLOGY:Paraspinal muscle biopsy (8/29): -Metastatic carcinoma involving skeletal muscle.  COMMENT: The specimen is compared to the prior biopsy ([id number]) and is similar in morphology. Immunohistochemistry shows that the tumor cells are positive for pancytokeratin and PAX8, with focal CAIX positivity, while negative for GATA3, CK7, and CK20. These findings are most consistent with a renal primary. Intradepartmental consultation was obtained ([facility] pathology). Recent Imaging Studies:Personally reviewed and notable for:MR head without and with contrastResult Date: 9/6/2025Decreased sensitivity/specificity secondary to significant patient motion. Overall, interval progression of the intracranial metastatic disease with new/growing metastatic lesions, increasing leptomeningeal enhancement, and multifocal cranial nerve  enhancement. New subtle FLAIR hyperintensity and diffusion restriction within the splenium of the corpus callosum, probably representing a cytotoxic lesion of the corpus callosum. Stable patchy signal/enhancement within the calvarium.  Findings are nonspecific and could be related to known renal osteodystrophy or metastatic disease. Attention on follow-up imaging is recommended. END OF IMPRESSION Current Inpatient Medications: senna  2 tablet Oral BID  polyethylene glycol  17 g Oral BID  insulin  regular  5 units Intravenous Once  dextrose   25 g Intravenous Once  albuterol   10 mg Nebulization Once  sodium zirconium cyclosilicate   10 g Oral TID  [START ON 11/19/2023] ceFAZolin  IV  2,000 mg Intravenous Every Tues,Thurs & Sat  Followed by  NOREEN ON 11/23/2023] ceFAZolin  IV  3,000 mg Intravenous Weekly  atorvastatin   20 mg Oral Daily with dinner  B complex-vitamin C-folic acid   1 tablet Oral Daily  cinacalcet   60 mg Oral QPM  dexAMETHasone   2 mg Oral BID WC  fludrocortisone   100 mcg Oral TID  fluticasone   1 spray Nasal Daily  magnesium  oxide  400 mg Oral Daily  midodrine   10 mg Oral TID  pantoprazole   40 mg Oral QAM  acetaminophen   1,000 mg Oral TID dextrose , oxyCODONE  **OR** oxyCODONE , DiphenhydrAMINE -Lidocaine -Maalox (BMX/First Mouthwash), melatonin, heparin  (porcine), heparin  (porcine), sodium chloride , dextrose , naloxoneAssessment: Mr. 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  He is in the process of undergoing staged orthopedic procedures for fracture prophylaxis in addition to fixation of the right humerus.  Additional work-up for initiation of radiation therapy to brain metastases is underway as well:Plans: ONC: Stage IV carcinoma, renal cell:-Tissue origin confirmed on biopsy of 8/29-Followed outpatient by Dr. Bronson: Not yet started on systemic therapy-MR head w/wo contrast (9/5) showing progression on intracranial  metastatic disease and most specifically LMD: -per my discussion with Dr. Bronson and Dr. Virgia this am WBRT would be indicated - Dr. Virgia to discuss with patient this admission. Will also consult neuro-oncology. -S/p 5 fractions of radiation to the cervical spine 9/3-Continue Decadron  2mg  BID as directed by radiation oncology-Orthopedic surgery consulting: -Plan for staged prophylactic IMN to femurs B/L:  -s/p IMN to right femur (follow-up reaming pathology) -WBAT on both LE-Timing of prophylactic IMN to left femur TBDMechanical fall resulting in R spiral humeral shaft fracture, concern for pathologic fx -In sling, NWB in RUE-Orthopedic surgery following: -Plan for IR embolization today, 9/8 followed by fixation, planned tentatively for 9/9 - He is medically optimized for surgery tomorrow once he completes HD in the morning.-Pain control            -Tylenol  1g TID             -OxyIR 5/10 mg q4h PRN            -Decadron  as above  Hypotension, chronic:-Continue midodrine  10 mg TID-Florinef  100 mcg TID Hyperlipidemia:-Holding home aspirin  -Home atorvastatin  20 mg daily ESRD on HD, hyperkalemia:-Nephro-Vite daily-Cinacalcet  60 mg daily-Nephrology following for provision of HD -HD per the patient's usual schedule (TThSa)-K+ 5.9 today - will give temporizing meds and lokelma  and have scheduled him for first HD slot in the am; monitor on BMP daily GERD:-Pantoprazole  40mg  daily F: OralE: BMP, Mg2+, phosphorus dailyN: Regular diet: renal diet after IR, NPO for OR on 9/9DVT PPx: On holdCode Status: Full  CodeDischarge Planning: (EDD):  9/15Discharge Criteria/Barriers to Discharge: Multiple surgical procedures plannedPT and/or OT Recommendations/Discharge to: TBD; likely rehab but patient would really like to avoid this - wife can be with him 24/7, they have a first floor set up and lots of equipement Appointments Needed with:  Orthopedic surgeryPlan of care discussed with oncology PA Brianna McKelvie, patient's wife via phone this afternoon, medical procedure team, orthopedics team, Dr. Virgia, Dr. Lonny Cavalier, MDAttending Physician, [facility] Medicine ServiceUS News Diagnoses

## 2023-11-19 ENCOUNTER — Encounter: Admission: EM | Disposition: E | Payer: Self-pay | Source: Ambulatory Visit | Attending: Internal Medicine

## 2023-11-19 ENCOUNTER — Inpatient Hospital Stay

## 2023-11-19 ENCOUNTER — Encounter: Payer: Self-pay | Admitting: Anesthesiology

## 2023-11-19 ENCOUNTER — Inpatient Hospital Stay: Admitting: Radiology

## 2023-11-19 DIAGNOSIS — R188 Other ascites: Secondary | ICD-10-CM

## 2023-11-19 DIAGNOSIS — Q613 Polycystic kidney, unspecified: Secondary | ICD-10-CM

## 2023-11-19 DIAGNOSIS — R000 Tachycardia, unspecified: Secondary | ICD-10-CM

## 2023-11-19 DIAGNOSIS — K5939 Other megacolon: Secondary | ICD-10-CM

## 2023-11-19 DIAGNOSIS — J948 Other specified pleural conditions: Secondary | ICD-10-CM

## 2023-11-19 DIAGNOSIS — G96198 Other disorders of meninges, not elsewhere classified: Secondary | ICD-10-CM

## 2023-11-19 DIAGNOSIS — C7951 Secondary malignant neoplasm of bone: Secondary | ICD-10-CM

## 2023-11-19 DIAGNOSIS — I959 Hypotension, unspecified: Secondary | ICD-10-CM

## 2023-11-19 DIAGNOSIS — K828 Other specified diseases of gallbladder: Secondary | ICD-10-CM

## 2023-11-19 DIAGNOSIS — R918 Other nonspecific abnormal finding of lung field: Secondary | ICD-10-CM

## 2023-11-19 DIAGNOSIS — J9811 Atelectasis: Secondary | ICD-10-CM

## 2023-11-19 DIAGNOSIS — N281 Cyst of kidney, acquired: Secondary | ICD-10-CM

## 2023-11-19 DIAGNOSIS — I82532 Chronic embolism and thrombosis of left popliteal vein: Secondary | ICD-10-CM

## 2023-11-19 LAB — CBC AND DIFFERENTIAL
Baso # K/uL: 0 THOU/uL (ref 0.0–0.2)
Eos # K/uL: 0 THOU/uL (ref 0.0–0.5)
Hematocrit: 32 % — ABNORMAL LOW (ref 37–52)
Hemoglobin: 9.7 g/dL — ABNORMAL LOW (ref 12.0–17.0)
Lymph # K/uL: 0.6 THOU/uL — ABNORMAL LOW (ref 1.0–5.0)
MCV: 91 fL (ref 75–100)
Mono # K/uL: 0.3 THOU/uL (ref 0.1–1.0)
Nucl RBC # K/uL: 0.9 THOU/uL — ABNORMAL HIGH (ref 0.0–0.1)
Nucl RBC # K/uL: 11.2 THOU/uL — ABNORMAL HIGH (ref 1.5–6.5)
Nucl RBC %: 6.9 /100{WBCs} — ABNORMAL HIGH (ref 0.0–0.2)
Platelets: 633 THOU/uL — ABNORMAL HIGH (ref 150–450)
RBC: 3.6 MIL/uL — ABNORMAL LOW (ref 4.0–6.0)
RDW: 23.6 % — ABNORMAL HIGH (ref 0.0–15.0)
Seg Neut %: 88.8 /100{WBCs} — AB (ref 0.0–0.2)
WBC: 12.6 THOU/uL — ABNORMAL HIGH (ref 3.5–11.0)

## 2023-11-19 LAB — BASIC METABOLIC PANEL
Anion Gap: 23 — ABNORMAL HIGH (ref 7–16)
CO2: 19 mmol/L — ABNORMAL LOW (ref 20–28)
Calcium: 8.1 mg/dL — ABNORMAL LOW (ref 8.6–10.2)
Chloride: 94 mmol/L — ABNORMAL LOW (ref 96–108)
Creatinine: 4.72 mg/dL — ABNORMAL HIGH (ref 0.67–1.17)
Glucose: 68 mg/dL (ref 60–99)
Lab: 35 mg/dL — ABNORMAL HIGH (ref 6–20)
Potassium: 4.9 mmol/L (ref 3.3–5.1)
Sodium: 136 mmol/L (ref 133–145)
eGFR BY CREAT: 13 — AB

## 2023-11-19 LAB — COMPREHENSIVE METABOLIC PANEL
ALT: <5 U/L (ref 0–50)
AST: 72 U/L — ABNORMAL HIGH (ref 0–50)
Albumin: 3.1 g/dL — ABNORMAL LOW (ref 3.5–5.2)
Alk Phos: 274 U/L — ABNORMAL HIGH (ref 40–130)
Anion Gap: 20 — ABNORMAL HIGH (ref 7–16)
Bilirubin,Total: 0.3 mg/dL (ref 0.0–1.2)
CO2: 20 mmol/L (ref 20–28)
Calcium: 8.4 mg/dL — ABNORMAL LOW (ref 8.6–10.2)
Chloride: 91 mmol/L — ABNORMAL LOW (ref 96–108)
Creatinine: 6.8 mg/dL — ABNORMAL HIGH (ref 0.67–1.17)
Glucose: 97 mg/dL (ref 60–99)
Lab: 55 mg/dL — ABNORMAL HIGH (ref 6–20)
Potassium: 5.2 mmol/L — ABNORMAL HIGH (ref 3.3–5.1)
Sodium: 131 mmol/L — ABNORMAL LOW (ref 133–145)
Total Protein: 5.6 g/dL — ABNORMAL LOW (ref 6.3–7.7)
eGFR BY CREAT: 8 — AB

## 2023-11-19 LAB — RED BLOOD CELLS
Component blood type: A POS
Component blood type: A POS

## 2023-11-19 LAB — LACTATE, PLASMA
Lactate: 1.9 mmol/L (ref 0.5–2.2)
Lactate: 2.6 mmol/L — ABNORMAL HIGH (ref 0.5–2.2)
Lactate: 3.3 mmol/L — ABNORMAL HIGH (ref 0.5–2.2)

## 2023-11-19 LAB — HEPATIC FUNCTION PANEL
ALT: 6 U/L (ref 0–50)
AST: 72 U/L — ABNORMAL HIGH (ref 0–50)
Albumin: 3 g/dL — ABNORMAL LOW (ref 3.5–5.2)
Alk Phos: 274 U/L — ABNORMAL HIGH (ref 40–130)
Bilirubin,Direct: <0.2 mg/dL (ref 0.0–0.3)
Bilirubin,Total: 0.2 mg/dL (ref 0.0–1.2)
Total Protein: 5.5 g/dL — ABNORMAL LOW (ref 6.3–7.7)

## 2023-11-19 LAB — PHOSPHORUS
Phosphorus: 5.9 mg/dL — ABNORMAL HIGH (ref 2.7–4.5)
Phosphorus: 4.5 mg/dL (ref 2.7–4.5)

## 2023-11-19 LAB — VENOUS BLOOD GAS
Base Excess,VENOUS: -2 mmol/L (ref <=2)
Bicarbonate,VENOUS: 24 mmol/L (ref 21–28)
CO2 (Calc),VENOUS: 25 mmol/L (ref 22–31)
CO: 1.4 %
FO2 HB,VENOUS: 66 % (ref 63–83)
Hemoglobin: 10.5 g/dL — ABNORMAL LOW (ref 12.0–17.0)
Methemoglobin: 1.1 % — ABNORMAL HIGH (ref 0.0–1.0)
PCO2,VENOUS: 45 mmHg (ref 40–50)
PH,VENOUS: 7.33 (ref 7.32–7.42)
PO2,VENOUS: 43 mmHg (ref 30–43)

## 2023-11-19 LAB — TYPE AND SCREEN: Antibody Screen: NEGATIVE

## 2023-11-19 LAB — APTT
aPTT: >200 s (ref 25.8–37.9)
aPTT: 38 s — ABNORMAL HIGH (ref 25.8–37.9)

## 2023-11-19 LAB — CBC
Hematocrit: 32 % — ABNORMAL LOW (ref 37–52)
Hemoglobin: 9.5 g/dL — ABNORMAL LOW (ref 12.0–17.0)
MCV: 90 fL (ref 75–100)
Platelets: 639 THOU/uL — ABNORMAL HIGH (ref 150–450)
RBC: 3.5 MIL/uL — ABNORMAL LOW (ref 4.0–6.0)
RDW: 23.9 % — ABNORMAL HIGH (ref 0.0–15.0)
WBC: 14.5 THOU/uL — ABNORMAL HIGH (ref 3.5–11.0)

## 2023-11-19 LAB — DIFF MANUAL
Diff Based On: 116 {cells}
Myelocyte %: 3 % — ABNORMAL HIGH

## 2023-11-19 LAB — RBC MORPHOLOGY

## 2023-11-19 LAB — NT-PRO BNP: NT-pro BNP: 2053 pg/mL — ABNORMAL HIGH (ref 0–900)

## 2023-11-19 LAB — PROTIME-INR
INR: 1.5 — ABNORMAL HIGH (ref 0.9–1.1)
Protime: 16.7 s — ABNORMAL HIGH (ref 10.0–12.9)

## 2023-11-19 LAB — MAGNESIUM
Magnesium: 1.9 mg/dL (ref 1.6–2.5)
Magnesium: 1.8 mg/dL (ref 1.6–2.5)

## 2023-11-19 LAB — CORTISOL: Cortisol,Random: 10.5 ug/dL

## 2023-11-19 SURGERY — INSERTION, INTRAMEDULLARY ROD, HUMERUS
Anesthesia: General | Site: Arm Upper | Laterality: Right

## 2023-11-19 SURGERY — ORIF, FRACTURE, HUMERUS, PROXIMAL
Anesthesia: General | Site: Arm Upper | Laterality: Right

## 2023-11-19 MED ORDER — NOREPINEPHRINE 8 MG IN NS 250 ML INFUSION *WRAPPED*
INTRAVENOUS | Status: AC
Start: 2023-11-19 — End: 2023-11-19
  Administered 2023-11-19: 2 ug/min via INTRAVENOUS
  Filled 2023-11-19: qty 250

## 2023-11-19 MED ORDER — SODIUM CHLORIDE 0.9 % IV BOLUS *I*
500.0000 mL | Freq: Once | Status: AC
Start: 2023-11-19 — End: 2023-11-19
  Administered 2023-11-19: 500 mL via INTRAVENOUS

## 2023-11-19 MED ORDER — VANCOMYCIN IV - PHARMACIST TO DOSE PLACEHOLDER *I*
Status: DC
Start: 2023-11-19 — End: 2024-01-18

## 2023-11-19 MED ORDER — NOREPINEPHRINE 8 MG IN NS 250 ML INFUSION *WRAPPED*
1.0000 ug/min | INTRAVENOUS | Status: DC
Start: 2023-11-19 — End: 2023-11-25
  Administered 2023-11-19: 8 ug/min via INTRAVENOUS
  Administered 2023-11-19: 2 ug/min via INTRAVENOUS
  Administered 2023-11-19: 8 ug/min via INTRAVENOUS
  Administered 2023-11-19: 4 ug/min via INTRAVENOUS
  Administered 2023-11-19: 6 ug/min via INTRAVENOUS
  Administered 2023-11-19 (×2): 8 ug/min via INTRAVENOUS
  Administered 2023-11-19 (×2): 10 ug/min via INTRAVENOUS
  Administered 2023-11-19: 8 ug/min via INTRAVENOUS
  Administered 2023-11-19: 12 ug/min via INTRAVENOUS
  Administered 2023-11-19: 10 ug/min via INTRAVENOUS
  Administered 2023-11-19: 14 ug/min via INTRAVENOUS
  Administered 2023-11-19: 2 ug/min via INTRAVENOUS
  Administered 2023-11-19: 6 ug/min via INTRAVENOUS
  Administered 2023-11-19: 14 ug/min via INTRAVENOUS
  Administered 2023-11-19: 6 ug/min via INTRAVENOUS
  Administered 2023-11-19: 8 ug/min via INTRAVENOUS
  Administered 2023-11-19: 6 ug/min via INTRAVENOUS
  Administered 2023-11-19: 8 ug/min via INTRAVENOUS
  Administered 2023-11-19: 4 ug/min via INTRAVENOUS
  Administered 2023-11-19: 6 ug/min via INTRAVENOUS
  Administered 2023-11-19 (×2): 10 ug/min via INTRAVENOUS
  Administered 2023-11-19 (×2): 8 ug/min via INTRAVENOUS
  Administered 2023-11-20: 14 ug/min via INTRAVENOUS
  Administered 2023-11-20 (×3): 12 ug/min via INTRAVENOUS
  Administered 2023-11-20: 14 ug/min via INTRAVENOUS
  Administered 2023-11-20 (×3): 12 ug/min via INTRAVENOUS
  Administered 2023-11-20: 14 ug/min via INTRAVENOUS
  Administered 2023-11-20 (×2): 12 ug/min via INTRAVENOUS
  Administered 2023-11-20 (×5): 14 ug/min via INTRAVENOUS
  Administered 2023-11-20: 16 ug/min via INTRAVENOUS
  Administered 2023-11-20: 14 ug/min via INTRAVENOUS
  Administered 2023-11-20: 16 ug/min via INTRAVENOUS
  Administered 2023-11-20: 14 ug/min via INTRAVENOUS
  Administered 2023-11-20: 16 ug/min via INTRAVENOUS
  Administered 2023-11-20: 12 ug/min via INTRAVENOUS
  Administered 2023-11-20 (×3): 14 ug/min via INTRAVENOUS
  Administered 2023-11-20: 12 ug/min via INTRAVENOUS
  Administered 2023-11-20: 16 ug/min via INTRAVENOUS
  Administered 2023-11-20 (×5): 14 ug/min via INTRAVENOUS
  Administered 2023-11-20: 16 ug/min via INTRAVENOUS
  Administered 2023-11-20: 14 ug/min via INTRAVENOUS
  Administered 2023-11-20: 12 ug/min via INTRAVENOUS
  Administered 2023-11-20 – 2023-11-21 (×6): 14 ug/min via INTRAVENOUS
  Administered 2023-11-21: 12 ug/min via INTRAVENOUS
  Administered 2023-11-21: 16 ug/min via INTRAVENOUS
  Administered 2023-11-21: 8 ug/min via INTRAVENOUS
  Administered 2023-11-21: 10 ug/min via INTRAVENOUS
  Administered 2023-11-21: 14 ug/min via INTRAVENOUS
  Administered 2023-11-21: 18 ug/min via INTRAVENOUS
  Administered 2023-11-21 (×2): 16 ug/min via INTRAVENOUS
  Administered 2023-11-21: 12 ug/min via INTRAVENOUS
  Administered 2023-11-21: 16 ug/min via INTRAVENOUS
  Administered 2023-11-21: 10 ug/min via INTRAVENOUS
  Administered 2023-11-21: 18 ug/min via INTRAVENOUS
  Administered 2023-11-21: 16 ug/min via INTRAVENOUS
  Administered 2023-11-21: 12 ug/min via INTRAVENOUS
  Administered 2023-11-21: 10 ug/min via INTRAVENOUS
  Administered 2023-11-21: 14 ug/min via INTRAVENOUS
  Administered 2023-11-21 (×2): 10 ug/min via INTRAVENOUS
  Administered 2023-11-21: 14 ug/min via INTRAVENOUS
  Administered 2023-11-21: 18 ug/min via INTRAVENOUS
  Administered 2023-11-21: 12 ug/min via INTRAVENOUS
  Administered 2023-11-21: 14 ug/min via INTRAVENOUS
  Administered 2023-11-21: 12 ug/min via INTRAVENOUS
  Administered 2023-11-21: 16 ug/min via INTRAVENOUS
  Administered 2023-11-21: 14 ug/min via INTRAVENOUS
  Administered 2023-11-21: 16 ug/min via INTRAVENOUS
  Administered 2023-11-21: 10 ug/min via INTRAVENOUS
  Administered 2023-11-21: 18 ug/min via INTRAVENOUS
  Administered 2023-11-21: 16 ug/min via INTRAVENOUS
  Administered 2023-11-21: 14 ug/min via INTRAVENOUS
  Administered 2023-11-21: 8 ug/min via INTRAVENOUS
  Administered 2023-11-21 (×3): 16 ug/min via INTRAVENOUS
  Administered 2023-11-21: 8 ug/min via INTRAVENOUS
  Administered 2023-11-21: 18 ug/min via INTRAVENOUS
  Administered 2023-11-21 (×3): 12 ug/min via INTRAVENOUS
  Administered 2023-11-21 (×2): 16 ug/min via INTRAVENOUS
  Administered 2023-11-21: 18 ug/min via INTRAVENOUS
  Administered 2023-11-21: 12 ug/min via INTRAVENOUS
  Administered 2023-11-21: 16 ug/min via INTRAVENOUS
  Administered 2023-11-21: 14 ug/min via INTRAVENOUS
  Administered 2023-11-22: 25 ug/min via INTRAVENOUS
  Administered 2023-11-22 (×2): 16 ug/min via INTRAVENOUS
  Administered 2023-11-22 (×2): 25 ug/min via INTRAVENOUS
  Administered 2023-11-22: 16 ug/min via INTRAVENOUS
  Administered 2023-11-22 (×2): 20 ug/min via INTRAVENOUS
  Administered 2023-11-22: 12 ug/min via INTRAVENOUS
  Administered 2023-11-22: 25 ug/min via INTRAVENOUS
  Administered 2023-11-22: 30 ug/min via INTRAVENOUS
  Administered 2023-11-22: 20 ug/min via INTRAVENOUS
  Administered 2023-11-22: 18 ug/min via INTRAVENOUS
  Administered 2023-11-22: 30 ug/min via INTRAVENOUS
  Administered 2023-11-22 (×2): 20 ug/min via INTRAVENOUS
  Administered 2023-11-22: 25 ug/min via INTRAVENOUS
  Administered 2023-11-22: 14 ug/min via INTRAVENOUS
  Administered 2023-11-22: 30 ug/min via INTRAVENOUS
  Administered 2023-11-22: 20 ug/min via INTRAVENOUS
  Administered 2023-11-22 (×2): 30 ug/min via INTRAVENOUS
  Administered 2023-11-22: 25 ug/min via INTRAVENOUS
  Administered 2023-11-22: 16 ug/min via INTRAVENOUS
  Administered 2023-11-22: 30 ug/min via INTRAVENOUS
  Administered 2023-11-22: 20 ug/min via INTRAVENOUS
  Administered 2023-11-22: 14 ug/min via INTRAVENOUS
  Administered 2023-11-22: 25 ug/min via INTRAVENOUS
  Administered 2023-11-22: 20 ug/min via INTRAVENOUS
  Administered 2023-11-22: 25 ug/min via INTRAVENOUS
  Administered 2023-11-22: 30 ug/min via INTRAVENOUS
  Administered 2023-11-22: 16 ug/min via INTRAVENOUS
  Administered 2023-11-22 (×4): 25 ug/min via INTRAVENOUS
  Administered 2023-11-22: 16 ug/min via INTRAVENOUS
  Administered 2023-11-22: 14 ug/min via INTRAVENOUS
  Administered 2023-11-22 (×3): 20 ug/min via INTRAVENOUS
  Administered 2023-11-22: 25 ug/min via INTRAVENOUS
  Administered 2023-11-22: 30 ug/min via INTRAVENOUS
  Administered 2023-11-22: 18 ug/min via INTRAVENOUS
  Administered 2023-11-22: 25 ug/min via INTRAVENOUS
  Administered 2023-11-22: 18 ug/min via INTRAVENOUS
  Administered 2023-11-22: 16 ug/min via INTRAVENOUS
  Administered 2023-11-22: 25 ug/min via INTRAVENOUS
  Administered 2023-11-22: 20 ug/min via INTRAVENOUS
  Administered 2023-11-22 (×2): 16 ug/min via INTRAVENOUS
  Administered 2023-11-22: 25 ug/min via INTRAVENOUS
  Administered 2023-11-22: 16 ug/min via INTRAVENOUS
  Administered 2023-11-22: 20 ug/min via INTRAVENOUS
  Administered 2023-11-22: 30 ug/min via INTRAVENOUS
  Administered 2023-11-22: 16 ug/min via INTRAVENOUS
  Administered 2023-11-22: 30 ug/min via INTRAVENOUS
  Administered 2023-11-22: 16 ug/min via INTRAVENOUS
  Administered 2023-11-22: 35 ug/min via INTRAVENOUS
  Administered 2023-11-22: 30 ug/min via INTRAVENOUS
  Administered 2023-11-23: 36 ug/min via INTRAVENOUS
  Administered 2023-11-23: 40 ug/min via INTRAVENOUS
  Administered 2023-11-23: 30 ug/min via INTRAVENOUS
  Administered 2023-11-23: 34 ug/min via INTRAVENOUS
  Administered 2023-11-23: 40 ug/min via INTRAVENOUS
  Administered 2023-11-23: 30 ug/min via INTRAVENOUS
  Administered 2023-11-23: 32 ug/min via INTRAVENOUS
  Administered 2023-11-23: 25 ug/min via INTRAVENOUS
  Administered 2023-11-23: 30 ug/min via INTRAVENOUS
  Administered 2023-11-23 (×3): 40 ug/min via INTRAVENOUS
  Administered 2023-11-23: 30 ug/min via INTRAVENOUS
  Administered 2023-11-23: 40 ug/min via INTRAVENOUS
  Administered 2023-11-23: 34 ug/min via INTRAVENOUS
  Administered 2023-11-23: 35 ug/min via INTRAVENOUS
  Administered 2023-11-23 (×2): 30 ug/min via INTRAVENOUS
  Administered 2023-11-23: 35 ug/min via INTRAVENOUS
  Administered 2023-11-23: 28 ug/min via INTRAVENOUS
  Administered 2023-11-23: 32 ug/min via INTRAVENOUS
  Administered 2023-11-23: 30 ug/min via INTRAVENOUS
  Administered 2023-11-23: 40 ug/min via INTRAVENOUS
  Administered 2023-11-23 (×4): 32 ug/min via INTRAVENOUS
  Administered 2023-11-23: 34 ug/min via INTRAVENOUS
  Administered 2023-11-23: 35 ug/min via INTRAVENOUS
  Administered 2023-11-23: 30 ug/min via INTRAVENOUS
  Administered 2023-11-23: 32 ug/min via INTRAVENOUS
  Administered 2023-11-23: 34 ug/min via INTRAVENOUS
  Administered 2023-11-23: 32 ug/min via INTRAVENOUS
  Administered 2023-11-23: 30 ug/min via INTRAVENOUS
  Administered 2023-11-23: 40 ug/min via INTRAVENOUS
  Administered 2023-11-23: 30 ug/min via INTRAVENOUS
  Administered 2023-11-23 (×2): 32 ug/min via INTRAVENOUS
  Administered 2023-11-23: 30 ug/min via INTRAVENOUS
  Administered 2023-11-23 (×3): 32 ug/min via INTRAVENOUS
  Administered 2023-11-23: 34 ug/min via INTRAVENOUS
  Administered 2023-11-23: 32 ug/min via INTRAVENOUS
  Administered 2023-11-23: 28 ug/min via INTRAVENOUS
  Administered 2023-11-23: 32 ug/min via INTRAVENOUS
  Administered 2023-11-23: 40 ug/min via INTRAVENOUS
  Administered 2023-11-23 (×2): 32 ug/min via INTRAVENOUS
  Administered 2023-11-23: 38 ug/min via INTRAVENOUS
  Administered 2023-11-23: 34 ug/min via INTRAVENOUS
  Administered 2023-11-23: 35 ug/min via INTRAVENOUS
  Administered 2023-11-23: 40 ug/min via INTRAVENOUS
  Administered 2023-11-23: 34 ug/min via INTRAVENOUS
  Administered 2023-11-23 (×2): 28 ug/min via INTRAVENOUS
  Administered 2023-11-23: 32 ug/min via INTRAVENOUS
  Administered 2023-11-24: 52 ug/min via INTRAVENOUS
  Administered 2023-11-24: 40 ug/min via INTRAVENOUS
  Administered 2023-11-24: 20 ug/min via INTRAVENOUS
  Administered 2023-11-24: 2 ug/min via INTRAVENOUS
  Administered 2023-11-24: 52 ug/min via INTRAVENOUS
  Administered 2023-11-24: 24 ug/min via INTRAVENOUS
  Administered 2023-11-24: 66 ug/min via INTRAVENOUS
  Administered 2023-11-24: 54 ug/min via INTRAVENOUS
  Administered 2023-11-24: 10 ug/min via INTRAVENOUS
  Administered 2023-11-24: 4 ug/min via INTRAVENOUS
  Administered 2023-11-24: 20 ug/min via INTRAVENOUS
  Administered 2023-11-24: 28 ug/min via INTRAVENOUS
  Administered 2023-11-24: 26 ug/min via INTRAVENOUS
  Administered 2023-11-24: 70 ug/min via INTRAVENOUS
  Administered 2023-11-24: 20 ug/min via INTRAVENOUS
  Administered 2023-11-24: 30 ug/min via INTRAVENOUS
  Administered 2023-11-24: 64 ug/min via INTRAVENOUS
  Administered 2023-11-24: 10 ug/min via INTRAVENOUS
  Administered 2023-11-24: 22 ug/min via INTRAVENOUS
  Administered 2023-11-24: 44 ug/min via INTRAVENOUS
  Administered 2023-11-24: 60 ug/min via INTRAVENOUS
  Administered 2023-11-24: 8 ug/min via INTRAVENOUS
  Administered 2023-11-24: 14 ug/min via INTRAVENOUS
  Administered 2023-11-24: 8 ug/min via INTRAVENOUS
  Administered 2023-11-24 (×2): 20 ug/min via INTRAVENOUS
  Administered 2023-11-24: 6 ug/min via INTRAVENOUS
  Administered 2023-11-24: 4 ug/min via INTRAVENOUS
  Administered 2023-11-24: 20 ug/min via INTRAVENOUS
  Administered 2023-11-24: 32 ug/min via INTRAVENOUS
  Administered 2023-11-24: 20 ug/min via INTRAVENOUS
  Administered 2023-11-24: 6 ug/min via INTRAVENOUS
  Administered 2023-11-24: 62 ug/min via INTRAVENOUS
  Administered 2023-11-24: 22 ug/min via INTRAVENOUS
  Administered 2023-11-24: 30 ug/min via INTRAVENOUS
  Administered 2023-11-24: 24 ug/min via INTRAVENOUS
  Administered 2023-11-24: 68 ug/min via INTRAVENOUS
  Administered 2023-11-24: 42 ug/min via INTRAVENOUS
  Administered 2023-11-24: 20 ug/min via INTRAVENOUS
  Administered 2023-11-24: 14 ug/min via INTRAVENOUS
  Administered 2023-11-24: 10 ug/min via INTRAVENOUS
  Administered 2023-11-24: 18 ug/min via INTRAVENOUS
  Administered 2023-11-24: 10 ug/min via INTRAVENOUS
  Administered 2023-11-24: 30 ug/min via INTRAVENOUS
  Administered 2023-11-24: 18 ug/min via INTRAVENOUS
  Administered 2023-11-24: 64 ug/min via INTRAVENOUS
  Administered 2023-11-24: 6 ug/min via INTRAVENOUS
  Administered 2023-11-24: 66 ug/min via INTRAVENOUS
  Administered 2023-11-24 (×2): 22 ug/min via INTRAVENOUS
  Administered 2023-11-24: 6 ug/min via INTRAVENOUS
  Administered 2023-11-24: 2 ug/min via INTRAVENOUS
  Administered 2023-11-24: 14 ug/min via INTRAVENOUS
  Administered 2023-11-24: 20 ug/min via INTRAVENOUS
  Administered 2023-11-24 (×2): 10 ug/min via INTRAVENOUS
  Administered 2023-11-24: 22 ug/min via INTRAVENOUS
  Administered 2023-11-24 (×2): 10 ug/min via INTRAVENOUS
  Administered 2023-11-24: 60 ug/min via INTRAVENOUS
  Administered 2023-11-24: 38 ug/min via INTRAVENOUS
  Administered 2023-11-24: 8 ug/min via INTRAVENOUS
  Administered 2023-11-24: 50 ug/min via INTRAVENOUS
  Administered 2023-11-24: 6 ug/min via INTRAVENOUS
  Administered 2023-11-24: 34 ug/min via INTRAVENOUS
  Administered 2023-11-24: 48 ug/min via INTRAVENOUS
  Administered 2023-11-24: 74 ug/min via INTRAVENOUS
  Administered 2023-11-24: 36 ug/min via INTRAVENOUS
  Administered 2023-11-24: 20 ug/min via INTRAVENOUS
  Administered 2023-11-24: 24 ug/min via INTRAVENOUS
  Administered 2023-11-24: 44 ug/min via INTRAVENOUS
  Administered 2023-11-24 (×2): 22 ug/min via INTRAVENOUS
  Administered 2023-11-24: 8 ug/min via INTRAVENOUS
  Administered 2023-11-24: 56 ug/min via INTRAVENOUS
  Administered 2023-11-24: 72 ug/min via INTRAVENOUS
  Administered 2023-11-24: 20 ug/min via INTRAVENOUS
  Administered 2023-11-24: 22 ug/min via INTRAVENOUS
  Administered 2023-11-24: 2 ug/min via INTRAVENOUS
  Administered 2023-11-24: 46 ug/min via INTRAVENOUS
  Administered 2023-11-24: 10 ug/min via INTRAVENOUS
  Administered 2023-11-24: 40 ug/min via INTRAVENOUS
  Administered 2023-11-24: 50 ug/min via INTRAVENOUS
  Administered 2023-11-24: 72 ug/min via INTRAVENOUS
  Administered 2023-11-24: 38 ug/min via INTRAVENOUS
  Administered 2023-11-24: 10 ug/min via INTRAVENOUS
  Administered 2023-11-24: 4 ug/min via INTRAVENOUS
  Administered 2023-11-24: 70 ug/min via INTRAVENOUS
  Administered 2023-11-24: 20 ug/min via INTRAVENOUS
  Administered 2023-11-24: 18 ug/min via INTRAVENOUS
  Administered 2023-11-24: 22 ug/min via INTRAVENOUS
  Administered 2023-11-24: 20 ug/min via INTRAVENOUS
  Administered 2023-11-24: 72 ug/min via INTRAVENOUS
  Administered 2023-11-24: 10 ug/min via INTRAVENOUS
  Administered 2023-11-24: 6 ug/min via INTRAVENOUS
  Administered 2023-11-24: 2 ug/min via INTRAVENOUS
  Administered 2023-11-24: 22 ug/min via INTRAVENOUS
  Administered 2023-11-24: 18 ug/min via INTRAVENOUS
  Administered 2023-11-24: 12 ug/min via INTRAVENOUS
  Administered 2023-11-24: 56 ug/min via INTRAVENOUS
  Administered 2023-11-24: 2 ug/min via INTRAVENOUS
  Administered 2023-11-24 (×2): 20 ug/min via INTRAVENOUS
  Administered 2023-11-24: 4 ug/min via INTRAVENOUS
  Administered 2023-11-24: 36 ug/min via INTRAVENOUS
  Administered 2023-11-24: 10 ug/min via INTRAVENOUS
  Administered 2023-11-24: 16 ug/min via INTRAVENOUS
  Administered 2023-11-24: 6 ug/min via INTRAVENOUS
  Administered 2023-11-24: 26 ug/min via INTRAVENOUS
  Administered 2023-11-24: 10 ug/min via INTRAVENOUS
  Administered 2023-11-24: 58 ug/min via INTRAVENOUS
  Administered 2023-11-24: 20 ug/min via INTRAVENOUS
  Administered 2023-11-24: 48 ug/min via INTRAVENOUS
  Administered 2023-11-24: 68 ug/min via INTRAVENOUS
  Administered 2023-11-25 (×2): 16 ug/min via INTRAVENOUS
  Administered 2023-11-25: 18 ug/min via INTRAVENOUS
  Administered 2023-11-25 (×2): 20 ug/min via INTRAVENOUS
  Administered 2023-11-25: 18 ug/min via INTRAVENOUS
  Administered 2023-11-25: 25 ug/min via INTRAVENOUS
  Administered 2023-11-25 (×2): 16 ug/min via INTRAVENOUS
  Administered 2023-11-25: 20 ug/min via INTRAVENOUS
  Administered 2023-11-25: 18 ug/min via INTRAVENOUS
  Administered 2023-11-25: 16 ug/min via INTRAVENOUS
  Administered 2023-11-25 (×2): 18 ug/min via INTRAVENOUS
  Administered 2023-11-25: 20 ug/min via INTRAVENOUS
  Administered 2023-11-25: 12 ug/min via INTRAVENOUS
  Administered 2023-11-25 (×2): 18 ug/min via INTRAVENOUS
  Administered 2023-11-25: 14 ug/min via INTRAVENOUS
  Administered 2023-11-25: 20 ug/min via INTRAVENOUS
  Administered 2023-11-25: 16 ug/min via INTRAVENOUS
  Administered 2023-11-25: 20 ug/min via INTRAVENOUS
  Administered 2023-11-25: 18 ug/min via INTRAVENOUS
  Administered 2023-11-25: 25 ug/min via INTRAVENOUS
  Administered 2023-11-25: 18 ug/min via INTRAVENOUS
  Administered 2023-11-25 (×3): 20 ug/min via INTRAVENOUS
  Administered 2023-11-25: 18 ug/min via INTRAVENOUS
  Administered 2023-11-25: 16 ug/min via INTRAVENOUS
  Administered 2023-11-25: 12 ug/min via INTRAVENOUS
  Administered 2023-11-25 (×2): 18 ug/min via INTRAVENOUS
  Administered 2023-11-25: 16 ug/min via INTRAVENOUS
  Administered 2023-11-25: 14 ug/min via INTRAVENOUS
  Administered 2023-11-25: 20 ug/min via INTRAVENOUS
  Administered 2023-11-25: 16 ug/min via INTRAVENOUS
  Administered 2023-11-25: 25 ug/min via INTRAVENOUS
  Administered 2023-11-25 (×2): 18 ug/min via INTRAVENOUS
  Administered 2023-11-25 (×2): 20 ug/min via INTRAVENOUS
  Administered 2023-11-25 (×3): 25 ug/min via INTRAVENOUS
  Administered 2023-11-25: 18 ug/min via INTRAVENOUS
  Administered 2023-11-25 (×2): 14 ug/min via INTRAVENOUS
  Administered 2023-11-25 (×2): 16 ug/min via INTRAVENOUS
  Filled 2023-11-19 (×21): qty 250

## 2023-11-19 MED ORDER — MIDODRINE HCL 5 MG PO TABS *I*
10.0000 mg | ORAL_TABLET | Freq: Once | ORAL | Status: AC
Start: 2023-11-19 — End: 2023-11-19
  Administered 2023-11-19: 10 mg via ORAL
  Filled 2023-11-19: qty 2

## 2023-11-19 MED ORDER — BISACODYL 10 MG RE SUPP *I*
10.0000 mg | Freq: Once | RECTAL | Status: AC
Start: 2023-11-19 — End: 2023-11-19
  Administered 2023-11-19: 10 mg via RECTAL

## 2023-11-19 MED ORDER — VANCOMYCIN HCL IN NACL 1500 MG/275 ML IV SOLN *I*
1500.0000 mg | Freq: Once | INTRAVENOUS | Status: AC
Start: 2023-11-19 — End: 2023-11-19
  Administered 2023-11-19: 1500 mg via INTRAVENOUS

## 2023-11-19 MED ORDER — ENOXAPARIN SODIUM 30 MG/0.3ML IJ SOSY *I*
30.0000 mg | PREFILLED_SYRINGE | Freq: Every day | INTRAMUSCULAR | Status: DC
Start: 2023-11-19 — End: 2023-11-24
  Administered 2023-11-19 – 2023-11-23 (×5): 30 mg via SUBCUTANEOUS
  Filled 2023-11-19 (×5): qty 0.3

## 2023-11-19 MED ORDER — OXYCODONE HCL 10 MG PO TABS *I*
10.0000 mg | ORAL_TABLET | ORAL | Status: AC | PRN
Start: 2023-11-19 — End: 2023-11-22

## 2023-11-19 MED ORDER — CEFEPIME 2GM IN D5W 110 ML IVPB *I*
2000.0000 mg | Freq: Once | INTRAVENOUS | Status: AC
Start: 2023-11-19 — End: 2023-11-19
  Administered 2023-11-19: 2000 mg via INTRAVENOUS

## 2023-11-19 MED ORDER — METRONIDAZOLE IN NACL 5 MG/ML IV SOLUTION *WRAPPED*
500.0000 mg | Freq: Three times a day (TID) | INTRAVENOUS | Status: DC
Start: 2023-11-19 — End: 2023-11-26
  Administered 2023-11-19 – 2023-11-22 (×9): 500 mg via INTRAVENOUS
  Filled 2023-11-19 (×12): qty 100

## 2023-11-19 MED ORDER — OXYCODONE HCL 5 MG PO TABS *I*
5.0000 mg | ORAL_TABLET | ORAL | Status: AC | PRN
Start: 2023-11-19 — End: 2023-11-22

## 2023-11-19 MED ORDER — HEPARIN SODIUM (PORCINE) 1000 UNIT/ML IJ SOLN *WRAPPED*
Status: AC
Start: 2023-11-19 — End: 2023-11-19
  Administered 2023-11-19: 1800 [IU]
  Filled 2023-11-19: qty 10

## 2023-11-19 MED ORDER — MIDODRINE HCL 5 MG PO TABS *I*
ORAL_TABLET | ORAL | Status: DC
Start: 2023-11-19 — End: 2023-11-19
  Filled 2023-11-19: qty 3

## 2023-11-19 MED ORDER — CEFEPIME-DEXTROSE 1 GM/58ML IV SOLR *I*
1000.0000 mg | INTRAVENOUS | Status: DC
Start: 2023-11-20 — End: 2023-11-22
  Administered 2023-11-20 – 2023-11-21 (×2): 1000 mg via INTRAVENOUS
  Filled 2023-11-19: qty 58
  Filled 2023-11-19: qty 1
  Filled 2023-11-19: qty 58

## 2023-11-19 MED ORDER — HYDROCORTISONE NA SUCCINATE PF 100 MG (50MG/ML) IJ SOLR *I*
50.0000 mg | Freq: Four times a day (QID) | INTRAMUSCULAR | Status: DC
Start: 2023-11-19 — End: 2023-11-25
  Administered 2023-11-19 – 2023-11-25 (×25): 50 mg via INTRAVENOUS
  Filled 2023-11-19 (×25): qty 2

## 2023-11-19 MED ORDER — POLYETHYLENE GLYCOL 3350 PO PACK 17 GM *I*
17.0000 g | PACK | Freq: Two times a day (BID) | ORAL | Status: DC
Start: 2023-11-20 — End: 2024-01-16
  Administered 2023-11-20 – 2023-11-21 (×4): 17 g via ORAL
  Filled 2023-11-19 (×4): qty 17

## 2023-11-19 NOTE — Progress Notes (Signed)
 Pharmacy Progress Note: Renal Dose AdjustmentThis patient is eligible for automatic renal dose adjustment by a pharmacist pursuant to the [facility] Standard Operating Procedure, "Adult Renal Dose Adjustment of Medications by Pharmacists" Current Assessment of Renal Function:   Lab results: 09/08/252324 09/08/251214 09/07/251039 Creatinine 6.80* 6.31* 4.63* UN 55* 50* 34* Renal Assessment:  End Stage Renal DiseaseAdditional Details:  Intermittent HDMedication Changes:Based on the current assessment of renal function, weight and indication the following medications have been renally dose adjusted to the dose and frequency listed below:Cefepime :   Indication:  Empiric Therapy  Dose:  2 g X1 then 1g  Frequency:  Q24hThe pharmacist will continue to monitor clinical course and changes in renal function and will perform additional dose adjustments, if needed. For questions or further discussion, please contact the pharmacist at extension [phone].  Lulia Schriner C Kallie Depolo, PharmD

## 2023-11-19 NOTE — Progress Notes (Signed)
 Interventional Radiology Progress NoteAssessment/Plan: Shane Pearson is a 63 y.o. male with history of Right pathological humerus fx s/p preoperative embolization via right groin access on 11/18/23. Coil bland embolization of lesion where able and coil embolization of lesion in areas with significant venous shunting. Access site dressing is clean and dry and there is no hematoma. Planned for OR today with ortho.IR will sign off.Contact the IR CONSULT pager with questions/concernsMason Tome Wilson, MDIR PGY69/11/2023 at 8:38 AM===================================================S:No groin pain.O:BP: (64-130)/(0-111) Temp:  [35.4 C (95.7 F)-36.5 C (97.7 F)] Temp src: Temporal (09/09 0717)Heart Rate:  [90-104] Resp:  [12-20] SpO2:  [89 %-99 %] Access site, RCFA: dressing clean, dry, intact. Site non tender. No hematoma/pseudoaneurysm.Intake/Output Summary (Last 24 hours) at 11/19/2023 0834Last data filed at 11/19/2023 0825Gross per 24 hour Intake 400 ml Output 0 ml Net 400 ml Labs:  Lab results: 09/08/252324 WBC 12.6* Hemoglobin 9.7* Hematocrit 32* RBC 3.6* Platelets 633*   Lab results: 09/08/252324 Sodium 131* Potassium 5.2* Chloride 91* CO2 20 UN 55* Creatinine 6.80* Glucose 97 Calcium  8.4* Total Protein 5.6* Albumin 3.1* ALT <5 AST 72* Alk Phos 274* Bilirubin,Total 0.3   Lab results: 09/08/252324 INR 1.5* Imaging:Medications:Current Facility-Administered Medications Medication Dose Route Frequency Last Admin  midodrine  (PROAMATINE ) 5 mg tablet        heparin  (porcine) 1,000 units/mL injection        senna (SENOKOT) tablet 2 tablet  2 tablet Oral BID 2 tablet at 11/18/23 1858  polyethylene glycol (GLYCOLAX ,MIRALAX ) powder 17 g  17 g Oral BID 17 g at 11/18/23 1859  sodium zirconium cyclosilicate  (LOKELMA ) oral packet 10 g  10 g Oral TID    oxyCODONE   (ROXICODONE ) immediate release tablet 5 mg  5 mg Oral Q4H PRN    Or  oxyCODONE  (ROXICODONE ) immediate release tablet 10 mg  10 mg Oral Q4H PRN 10 mg at 11/18/23 0823  ceFAZolin  (ANCEF ) 2,000 mg in sterile water  (PF) IV syringe 20 mL  2,000 mg Intravenous Every Tues,Thurs & Sat    Followed by  [START ON 11/23/2023] ceFAZolin  (ANCEF ) 3,000 mg in sterile water  (PF) IV syringe 30 mL  3,000 mg Intravenous Weekly    atorvastatin  (LIPITOR) tablet 20 mg  20 mg Oral Daily with dinner 20 mg at 11/18/23 1900  B complex-vitamin C-folic acid  (NEPHRO-VITE) tablet 1 tablet  1 tablet Oral Daily 1 tablet at 11/18/23 0823  cinacalcet  (SENSIPAR ) tablet 60 mg  60 mg Oral QPM 60 mg at 11/18/23 2101  dexAMETHasone  (DECADRON ) tablet 2 mg  2 mg Oral BID WC 2 mg at 11/18/23 1858  fludrocortisone  (FLORINEF ) tablet 100 mcg  100 mcg Oral TID 100 mcg at 11/19/23 0818  fluticasone  (FLONASE ) 50 MCG/ACT nasal spray 1 spray  1 spray Nasal Daily 1 spray at 11/18/23 0824  magnesium  oxide (MAG-OX) tablet 400 mg  400 mg Oral Daily 400 mg at 11/18/23 9176  midodrine  (PROAMATINE ) tablet 10 mg  10 mg Oral TID 10 mg at 11/19/23 0706  pantoprazole  (PROTONIX ) EC tablet 40 mg  40 mg Oral QAM 40 mg at 11/18/23 9177  diphenydraMINE-lidocaine -maalox (BMX/First Mouthwash) compound suspension  15 mL Mouth/Throat Q4H PRN    melatonin tablet 3 mg  3 mg Oral QHS PRN    acetaminophen  (TYLENOL ) tablet 1,000 mg  1,000 mg Oral TID 1,000 mg at 11/18/23 2235  heparin  (porcine) 1,000 units/mL injection 0-5,000 units  0-5 mL Intracatheter PRN 1,800 units at 11/16/23 1238  heparin  (porcine) 1,000 units/mL injection 0-5,000 units  0-5 mL Intracatheter PRN 1,800 units at  11/16/23 1237  sodium chloride  0.9 % FLUSH REQUIRED IF PATIENT HAS IV  0-500 mL/hr Intravenous PRN    dextrose  5 % FLUSH REQUIRED IF PATIENT HAS IV  0-500 mL/hr Intravenous PRN    naloxone  (NARCAN ) 0.4 mg/mL injection 0.08 mg  0.08 mg Intravenous PRN   Problem  List:Patient Active Problem List Diagnosis Code  ESRD (end stage renal disease) on dialysis N18.6, Z99.2  HLD (hyperlipidemia) E78.5  Hypotension I95.9  Ischemic colitis K55.9  Allergic rhinitis J30.9  Arteriosclerosis of artery of extremity I70.209  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  Embolism due to vascular prosthetic devices, implants and grafts, initial encounter T82.818A  Spondylosis of lumbosacral region without myelopathy or radiculopathy M47.817  Diarrhea R19.7  Failure to thrive in adult R62.7  History of temporal artery biopsy Z98.890  Leg weakness R29.898  Other closed displaced fracture of proximal end of right humerus, initial encounter S42.291A  Closed displaced oblique fracture of shaft of right humerus, initial encounter S42.331A  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

## 2023-11-19 NOTE — Progress Notes (Addendum)
 Orthopaedic Surgery Progress Note for 9/9/2025Patient:Shane LytleMRN: [id number] DOA: 9/3/2025Subjective: Had episodes of hypotension to 70/60/30's thought to be due to patient being NPO yesterday responded slightly to bolus of fluids. Cortisol ordered by primary team found to be normal . Underwent IR embolization yesterday of right humerus. Had left femoral line placed for access, Right IJ was removed. Has otherwise tolerated PO. First slot HD today. Patient denies fever/chills, SOB, chest pain, nausea/vomiting, or numbness/weakness. Objective:Temp:  [35.4 C (95.7 F)-36.5 C (97.7 F)] 35.6 C (96.1 F)Heart Rate:  [90-100] 91Resp:  [12-20] 18BP: (64-130)/(0-111) 85/51Recent Labs Lab 09/08/252324 09/08/251214 09/07/250248 WBC 12.6* 13.9* 12.1* Hemoglobin 9.7* 9.8* 9.9* Hematocrit 32* 33* 34* Platelets 633* 607* 468* Recent Labs Lab 09/08/252324 09/08/252015 09/08/251214 09/07/251039 Sodium 131*  --  133 136 Potassium 5.2* 5.4* 5.9* 5.1 Chloride 91*  --  91* 94* CO2 20  --  20 23 No components found with this basename: BUN, LABGLOM, CALCIUMRecent Labs Lab 09/08/252324 09/08/251214 09/06/250820 INR 1.5* 1.4* 1.1 No results for input(s): ESR, CRP in the last 168 hours.Exam:NADNo respiratory distressRUE: skin intact, SILT axillary/med/uln/superficial radial nerve distributions, +OK sign/Thumbs up/finger abduction/thumb IP flexion, +wrist flexion/extension, . Brisk capillary refill, Hand wwpImaging: XR reviewed, pathologic short oblique huemrus fxAssessment and Plan:63 y.o. male with pathologic right humerus fx, plan for OR todayHD today preopPlan for OR- biopsy and IMNOptimized by primaryDispo: Pending postop course, staged femur CMN Orval CHANETA Sin, MDOrthopaedic Surgery Resident 11/19/2023 at 6:37 AM

## 2023-11-19 NOTE — H&P (Addendum)
 Medical ICU History and Physical NoteLOS: 6HPI: Shane Pearson is a 63 y.o. 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 During course of admissions MAPS have been 60-80s, aside from intra-operatively on 09/06. Since yesterday, MAPS have been in the 50-70s range. No fevers during course of admission. No worsening sputum production. No worsening chest or abdominal pain. No known wounds or sores. No increased pain or redness at HD catheter site.In the last 24 hours, received approximately 3.5 L IV fluids. Including a 1 Liter bolus, 500 cc bolus x2, and 1 L returned at HD. Despite this MAPs did not improve. Per patient, at home pressures will be in the 80s systolics.While here, was having malfunction of the L IJ prompting a tunneled left femoral HD catheter. Medical/Surgical/Family History Shane Pearson:Shane Pearson Medical History[1] EDY:Shane Pearson Surgical History[2] Medications:Prior to Admission medications Medication Sig Start Date End Date Taking? Authorizing Provider dexAMETHasone  (DECADRON ) 2 MG tablet Take 1 tablet (2 mg total) by mouth 2 times daily (with meals) for cord compression. 11/12/23  Yes Shane Crank, MD acetaminophen  (TYLENOL ) 500 mg tablet Take 1 tablet (500 mg total) by mouth 3 times daily as needed for Pain for Pain. Take 2 tabs (1000mg ) by mouth three times a day as needed for pain. MDD 3000mg . 10/20/23  Yes [provider] midodrine  (PROAMATINE ) 10 mg tablet Take 1 tablet (10 mg total) by mouth 3 times daily for Disorder of Low Blood Pressure.   Yes [provider] pantoprazole  (PROTONIX ) 40 mg EC  tablet Take 1 tablet (40 mg total) by mouth every morning. Swallow whole. Do not crush, break, or chew. 10/19/23 11/18/23 Yes Shane Herring, PA magnesium  oxide (MAGNESIUM  OXIDE -MG SUPPLEMENT) 400 (241.3 mg) mg tablet Take 1 tablet (400 mg total) by mouth daily. 05/30/23  Yes Grewal, Rickinder S, MD aspirin  81 MG EC tablet Take 1 tablet (81 mg total) by mouth daily for DVT prophylaxis.   Yes [provider] fluticasone  (FLONASE ) 50 MCG/ACT nasal spray Spray 1 spray into nostril daily for allergies.  Spray 1 spray each nares 10/20/23  Yes [provider] atorvastatin  (LIPITOR) 20 mg tablet Take 1 tablet (20 mg total) by mouth daily (with dinner) for High Amount of Fats in the Blood.   Yes [provider] fludrocortisone  (FLORINEF ) 0.1 mg tablet Take 1 tablet (100 mcg total) by mouth 3 times daily for Blood Pressure Drop Upon Standing.   Yes [provider] b complex-vitamin c-folic acid  (NEPHRO-VITE) tablet Take 1 tablet by mouth daily for Complex vitamin.   Yes [provider] calcium -vitamin D  (OSCAL-500) 500-200 MG-UNIT per tablet Take 1 tablet by mouth 2 times daily for Vit D.   Yes [provider] cinacalcet  (SENSIPAR ) 30 MG tablet Take 2 tablets (60 mg total) by mouth every evening for parathyroid.   Yes [provider] Commode, adjustable 3-in-1 for Cancer that has Spread From One Place to Another. Use as directed 11/08/23   Shane Germaine Dowdy, MD walker Exact equipment needed: Rolling walker Specific length of time needed: LifetimeICD10 Code: R26.2 Ht: 5' 5   Wt: 175 lbs 10/09/23   Shane Pearson, Shane Pearson  Allergies: Allergies[3]ObjectiveObjective Vital Signs: BP (!) 86/60 (BP Location: Left arm)   Pulse 109  Temp 36.2 C (97.2 F)   Resp 16   Ht 1.753 m (5' 9)   Wt 77.6 kg (171 lb)   SpO2 96%   BMI 25.25 kg/m  Physical Exam:Constitutional: No initial painful or respiratory distress. Eyes:  PERRLHEENT: MM dry.Cardiovascular: tachycardic, regular rate and rhythm.Respiratory: anteriorly lungs clear to auscultation.Gastrointestinal: soft, distended. Caput medusae present bilaterally.Musculoskeletal: Grip strength intact bilaterally. Wiggles toes without issue. Bilateral lower extremity edema present bilaterally. Skin: warm and dry, no rash Neurological: Initially somnolent and returning to sleep quickly although answers relatively appropriate. No meningismus. PERRL. Following commands appropriately. Assessment: Shane Pearson is 63 y.o. 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 Not on home CPAP/BiPAP. Less likely infectious.- On 2 L nasal cannula. Increased from baseline oxygen requirements.VBG pH 7.33, pCO2 45.-Chest x-ray obtained w/ evidence of pulmonary edema/small effusions. Unchanged from prior.-Sputum cultures not obtained-Strep/legionella not obtained.-MRSA nares obtained.-No prior PFTs.-Large pleural effusion; likely malignant although cannot exclude infectious source. Given increased oxygen requirement, may benefit from thoracentesis in coming days.Prior drainage 07/22 cloudy/yellow with malignant tumor cells present. LD 547, Pleural Protein 4.2. Total Protein 6.4. Exudate/Culture negative. No glucose obtained at that time.CV: History of HFpEF, longstanding hypotension-EKG, sinus tachycardia, low voltage.- Prior echo 2016 demonstrating mild diastolic dysfunction.- Repeat echo pending.-Not on any diuretics or antihypertensives @ baseline-Midodrine  10 TID @  home-Currently on Levophed , MAP goal 60.-NT-pro BNP on admission 2700. Repeat pending.-Lactate overnight 3.5, now 1.9.-Continue home Atorvastatin  20 mg.-Continue home Aspirin  81 mg daily.Renal: ESRD T/Th/[facility] dialysis. Anuric @ baseline.- Last HD session on 09/07.-Na 131, K 5.2. Pre-dialysis labs pending.- No chronic indwelling foley. Intake/Output Summary (Last 24 hours) at 11/19/2023 1438Last data filed at 11/19/2023 1350Gross per 24 hour Intake 3703.25 ml Output 317 ml Net 3386.25 ml - Indwelling urinary catheter?:  No.GI, c/f ileus. Distended gallbladder although no clinical evidence of cholecystitis.- No feeding tube.- NPO given concerns for ileus.- Senna/Miralax  - RUQ US  ordered to further characterize.ID (Specific known/suspected infections, sources): No infectious source, considerations include intra-abdominal: gallbladder based on CT imaging (although exam/RUQ panel reassuring), pleural, and CLABSI.- WBC 14.5, close to baseline 10-12. Neutrophil count 11.2, Lymphocytes 0.6.- Initiated on Vanc/Cefepime /Flagyl . Discontinue if remains culture negative x48 hours.- Culture data: Blood cultures NGTD thus far. Collected on 11/18/2023. Repeat blood cultures collected from HD cath. Remain NGTD.-No urine cultures obtained, anuric @ baseline.-Current antibiotic regimen: Vanc/Cefe/Flagyl  11/19/23.-Consider Atovaquone PJP prophylaxis-CT imaging w/ abd w/ severely dilated gallbladder, high density small volume perihepatic/ascitic fluid.-CT chest w/ shifting pleural fluid, large left pleural effusion.Heme: chronic anemia. - Not currently on DVT PPX. Will initiate Lovenox  30 mg SQ.- Obtain BLE US  to evaluate for DVT.-Schistocytes, spiculated, and target cells present on diff. However lines are otherwise stable.Endo:  Adrenal Insufficiency on Florinef  & Dexamethasone .- On Florinef  for hypotension.- On Dexamethasone  for spinal mets-  Initiated on Hydrocort  50 mg q6 SDS.- BG 60-130s during hospital course.-Cinacalcet  for hyperparathyroidismNeuro: Pathologic R humeral fracture.-Tylenol  for pain-Can consider Oxycodone  5/10 mg for pain if additional is needed.-Ongoing palliative XRT therapy.-Continue 2 mg Dexamethasone  twice daily.-Ortho initially planning OR today for biopsy & IMN. This was paused given interim instability.-Planning WBRT radiation for incranial metastatic disease. Neuro-oncology & Rad-oncology following.-WBAT bilateral LE. Integument: ICU level care. No  wounds.Lines/Tubes/Drains: R femoral line, L subclavian CVC.Medication Reconciliation Completed: Yes Code Status: FULL CODEFamily concern/updates: UpdatedHave PCP & Relevant consultants been notified of admission?: YesPlan of care discussed with: Dr. Nerissa for this hospitalization include:Large L pleural effusion, Stage IV renal cell carcinoma, pathologic fracture, sepsis, acute hypoxic respiratory failure.Debby Bone, MD Internal Medicine, PGY-2   [1] Past Medical History:Diagnosis Date  Arthritis   CHF (congestive heart failure)   Colon polyp   Dialysis patient   ESRD (end stage renal disease)   GERD (gastroesophageal reflux disease)   Hypercholesterolemia   Hypotension   Ischemic colitis   Thyroid  disease   hypo [2] Past Surgical History:Procedure Laterality Date  DIALYSIS FISTULA CREATION Bilateral   4 fistulas have failed- ? d/t low BP and small veins  HERNIA REPAIR    PR LIGATION/BIOPSY TEMPORAL ARTERY Right 10/04/2023  Procedure: TEMPORAL ARTERY BIOPSY RIGHT EYE;  Surgeon: Lynn Ervin Edelman, MD;  [facility] MA[facility];  Service: Ophthalmology  Right inguinal hernia repair   [3] AllergiesAllergen Reactions  Penicillins Swelling and Rash   Facial swellingMed History Tech discussed Penicillin allergy with patient on 10/18/23. Please see  Antimicrobial Stewardship Chart Note for additional details.  Amoxicillin Swelling and Rash   Facial swellingMed History Tech discussed Amoxicillin allergy with patient on 10/18/23. Please see Antimicrobial Stewardship Chart Note for additional details.  Baclofen Other (See Comments)   confusion  Coconut Nausea And Vomiting  No Known Latex Allergy

## 2023-11-19 NOTE — Provider Consult (Addendum)
 We saw and evaluated Shane Pearson today to discuss recommendations and plans for RT in light of new MRI showing leptomeningeal disease. We recommend WBRT, likely in 10-15 fractions. Given current poor performance status and acuity, we will hold off planning or starting RT for his brain metastases until his clinical status improves. We can plan to simulate patient while inpatient here and treat outpatient in [place] pending improvement in clinical status. We discussed these recommendations with [name] and answered all questions.Earlie Molt, MDPGY-2 ResidentRadiation Oncology9/9/2025The patient was also seen and evaluated by Dr. Virgia. Formal attending attestation to follow. I saw and evaluated the patient. I agree with the resident's/fellow's findings and plan of care as documented.OZELL ONEIDA VIRGIA, MD,PhD

## 2023-11-19 NOTE — Progress Notes (Signed)
 Hematology Oncology Hospitalist Progress Note  LOS: 6 days  Full Code Significant Events/Subjective: Patient seen and examined multiple times throughout the day.  First this morning during HD.  Overnight, had multiple episodes of hypotension with systolic BP ranging from 70s-90s.  This has persisted throughout the day.  He has received nearly 3.5 L of IVF without improvement.  He is anuric given that he has ESRD.  Fortunately, his respiratory status is stable but do not feel that further IV fluids would be helpful at this time.During this morning's evaluation he seemed a little more confused than normal i.e. was perseverating on his Lipitor for unclear reasons.  Denies fever, chills, headache, runny nose, CP, shortness of breath. Last BM at least 5 days ago.  Says he has been passing gas.  Ordered CT C/A/P this morning but has not been able to get these studies done given unstable blood pressure.  Upon return to [facility] his systolic blood pressure was once again in the 70s.  A rapid was called which I was present for.  Discussed with ICU team. Started empiric abx (cef, flagyl , vanc) for suspected infection of unclear etiology.  Will plan to transfer to ICU for likely pressor support and further workup of hypotension of unclear cause.  Updated his wife via phone who was understanding and in agreement with plan.  Also updated Dr. Debera (orthopedic surgeon) of plan for transfer to MICU.ROS - as aboveObjective: Physical ExamTemp:  [35.4 C (95.7 F)-36.5 C (97.7 F)] 36.2 C (97.2 F)Heart Rate:  [90-105] 105Resp:  [12-20] 16BP: (64-130)/(0-111) 81/53 Physical ExamConstitutional:     Comments: Pleasant gentleman lying in bed in NAD  Cardiovascular:    Rate and Rhythm: Normal rate and regular rhythm. Pulmonary:    Effort: Pulmonary effort is normal.    Breath sounds: Normal breath sounds.    Comments: Breathing comfortably on RAAbdominal:    General:  Bowel sounds are normal. There is distension.    Palpations: Abdomen is soft.    Tenderness: There is no abdominal tenderness. Musculoskeletal:    Comments: RUE with edema but able to wiggle fingers, in sling Skin:   General: Skin is warm and dry. Neurological:    General: No focal deficit present.    Mental Status: He is alert.    Comments: A little more confused today  Psychiatric:    Comments: Calm and cooperative  Recent Lab Studies:Personally reviewed and notable qnm:[facility] Labs Lab 09/09/251237 09/08/252324 09/08/251214 WBC 14.5* 12.6* 13.9* Hemoglobin 9.5* 9.7* 9.8* Hematocrit 32* 32* 33* Platelets 639* 633* 607*  Recent Labs Lab 09/08/252324 09/08/252015 09/08/251214 09/07/251039 09/07/250248 09/05/250151 09/04/250625 09/03/252134 Sodium 131*  --  133 136 137   < > 132*  --  Potassium 5.2* 5.4* 5.9* 5.1 5.2*   < > 5.6*  --  Chloride 91*  --  91* 94* 94*   < > 87*  --  CO2 20  --  20 23 24    < > 20  --  UN 55*  --  50* 34* 29*   < > 56*  --  Creatinine 6.80*  --  6.31* 4.63* 4.12*   < > 7.53*  --  Glucose 97  --  83 88 98   < > 82  --  Calcium  8.4*  --  8.3* 8.0* 8.1*   < > 7.9*  --  Albumin 3.1*  --   --   --   --   --  3.2* 3.3* Phosphorus 5.9*  --  5.6*  --  5.1*   < > 5.6*  --   < > = values in this interval not displayed. Recent Labs Lab 09/08/252324 09/08/251214 09/06/250820 INR 1.5* 1.4* 1.1 Protime 16.7* 15.6* 12.5 PATHOLOGY:Paraspinal muscle biopsy (8/29): -Metastatic carcinoma involving skeletal muscle.  COMMENT: The specimen is compared to the prior biopsy ([id number]) and is similar in morphology. Immunohistochemistry shows that the tumor cells are positive for pancytokeratin and PAX8, with focal CAIX positivity, while negative for GATA3, CK7, and CK20. These findings are most consistent with a renal primary. Intradepartmental consultation was obtained ([facility]  pathology). Independent lab review:Lactate: 3.3 -> 2.6 -> 1.9Na 131, K 5.2WBC 14.5 up from 12.6 last nightBCx from 9/8 - NGTD Recent Imaging Studies:Personally reviewed and notable for:MR head without and with contrastResult Date: 9/6/2025Decreased sensitivity/specificity secondary to significant patient motion. Overall, interval progression of the intracranial metastatic disease with new/growing metastatic lesions, increasing leptomeningeal enhancement, and multifocal cranial nerve enhancement. New subtle FLAIR hyperintensity and diffusion restriction within the splenium of the corpus callosum, probably representing a cytotoxic lesion of the corpus callosum. Stable patchy signal/enhancement within the calvarium. Findings are nonspecific and could be related to known renal osteodystrophy or metastatic disease. Attention on follow-up imaging is recommended. END OF IMPRESSION Current Inpatient Medications: [START ON 11/20/2023] ceFEPime  (MAXIPIME ) IV  1,000 mg Intravenous Q24H  metroNIDAZOLE   500 mg Intravenous Q8H  vancomycin   1,500 mg Intravenous Once  ceFEPime  (MAXIPIME ) IV  2,000 mg Intravenous Once  senna  2 tablet Oral BID  polyethylene glycol  17 g Oral BID  atorvastatin   20 mg Oral Daily with dinner  B complex-vitamin C-folic acid   1 tablet Oral Daily  cinacalcet   60 mg Oral QPM  dexAMETHasone   2 mg Oral BID WC  fludrocortisone   100 mcg Oral TID  fluticasone   1 spray Nasal Daily  magnesium  oxide  400 mg Oral Daily  midodrine   10 mg Oral TID  pantoprazole   40 mg Oral QAM  acetaminophen   1,000 mg Oral TID oxyCODONE  **OR** oxyCODONE , DiphenhydrAMINE -Lidocaine -Maalox (BMX/First Mouthwash), melatonin, heparin  (porcine), heparin  (porcine), sodium chloride , dextrose , naloxoneAssessment: Mr. Nippert is a 63 year old man with ESRD on HD, RCC with intracranial, spinal metastases and other diffuse skeletal metastases, malignant left pleural effusion, CHF,  chronic orthostatic hypotension, history of ischemic colitis who presented on 9/3 with a right humeral midshaft fracture suffered in a mechanical fall.  He is in the process of undergoing staged orthopedic procedures for fracture prophylaxis in addition to fixation of the right humerus.  Additional work-up for initiation of radiation therapy to brain metastases is underway as well.Hospital course complicated by severe sepsis refractory to IVF resuscitation. Infectious work-up underway, started on empiric broad spectrum abx. Will transfer to ICU for likely pressor support and further workup of hypotension of unclear cause.  Plans: Severe sepsis of unclear etiology- over the past 24 hrs BP has remained in the 70's-90's despite receiving 3.5 L IVF- possible sources of infection include: bacteremia (of note, new L fem line placed on 9/8), BCx obtained from 9/8 are NGTD - appears only fem line was cultured, will obtain cultures from HD line as well - anuric so can't obtain UA- no diarrhea, in fact he has been constipated - random cortisol WNL on 9/9- breathing comfortably on RA - defer CXR for now given plans for CT chest- CT C/A/P without contrast PENDING - started on empiric cef, flagyl  and vanc (9/9-date)- transfer to MICU for likely pressor support ONC: Stage IV carcinoma, renal cell:-Tissue origin confirmed on biopsy of 8/29-Followed outpatient  by Dr. Bronson: Not yet started on systemic therapy-MR head w/wo contrast (9/5) showing progression on intracranial metastatic disease and most specifically LMD: -per my discussion with Dr. Bronson and Dr. Virgia on 9/8 WBRT would be indicated - Dr. Virgia to discuss with patient this admission. Neuro-oncology also consulted-S/p 5 fractions of radiation to the cervical spine 9/3-Continue Decadron  2mg  BID as directed by radiation oncology-Orthopedic surgery consulting: -Plan for staged prophylactic IMN to femurs B/L:  -s/p IMN to  right femur (follow-up reaming pathology) -WBAT on both LE-Timing of prophylactic IMN to left femur TBDMechanical fall resulting in R spiral humeral shaft fracture, concern for pathologic fx -In sling, NWB in RUE-Orthopedic surgery following: -underwent IR embolization on 9/8 and was planned to undergo fixation today. Unfortunately he is no longer optimized for surgery and thus, per my discussion with Dr.  this has been cancelled , planned tentatively for 9/9 - He is NO longer medically optimized for surgery given severe sepsis of unclear etiology -Pain control            -Tylenol  1g TID             -OxyIR 5/10 mg q4h PRN            -Decadron  as above  Reported adrenal insufficiency Hypotension, chronic:-Continue midodrine  10 mg TID-Florinef  100 mcg TID-random cortisol WNL on 9/9 Hyperlipidemia:-Holding home aspirin  -Home atorvastatin  20 mg daily ESRD on HD, hyperkalemia:-Nephro-Vite daily-Cinacalcet  60 mg daily-Nephrology following for provision of HD -HD per the patient's usual schedule (TThSa) - session shorted to 2 hrs today with no volume removal (in fact gave 700 cc back in HD)  GERD:-Pantoprazole  40mg  daily F: OralE: BMP, Mg2+, phosphorus dailyN: Regular diet: renal diet after IR, NPO for OR on 9/9DVT PPx: On holdCode Status: Full CodeDischarge Planning: (EDD):  9/15Discharge Criteria/Barriers to Discharge: Clinical stability, multiple surgical procedures plannedPT and/or OT Recommendations/Discharge to: TBD; likely rehab but patient would really like to avoid this - wife can be with him 24/7, they have a first floor set up and lots of equipement Appointments Needed with: Orthopedic surgeryPlan of care discussed with oncology PA Chrystine Kling, patient's wife via phone this afternoon, ICU team, orthopedics team, Dr. Debera This is a high complexity patient who I personally spent >55 minutes on the calendar day of  the encounter, including pre and post visit work.Additionally, I spent >30 cumulative minutes, in full attention to this critically ill patient, who has a high probability of life threatening deterioration in their clinical condition due to severe sepsis.  I provided close monitoring and interventions as described in my assessment and plan.Reynolds Cavalier, MDAttending Physician, [facility] Medicine ServiceUS News Diagnoses

## 2023-11-19 NOTE — Plan of Care (Signed)
 Assumed care of patient 1500-0300.  VSS throughout shift, besides hypotensive blood pressures which were not symptomatic. Provider made aware, see providers note. Patient is making needs known to Clinical research associate. Please refer to patient flowsheets and eMAR for further assessments and documentation.Problem: SafetyGoal: Patient will remain free of fallsOutcome: MaintainingGoal: Prevent any intentional injuryOutcome: Maintaining Problem: Pain/ComfortGoal: Patient's pain or discomfort is manageableOutcome: Maintaining Problem: MobilityGoal: Patient's functional status is maintained or improvedOutcome: Maintaining

## 2023-11-19 NOTE — Progress Notes (Signed)
 Nephrology Attending Pt sen and examing  on HDHyportensive ast night - hypotension worsening- ? New sepsisObjective:Vitals:  11/19/23 0825 11/19/23 0830 11/19/23 0900 11/19/23 0917 BP: 100/61 (!) 88/49 (!) 89/57 (!) 82/53 BP Location:     Pulse:     Resp:     Temp:     TempSrc:     SpO2:  97% 95% 93% Weight:     Height:     Intake/Output last 3 shifts:No intake/output data recorded.Physical ExamGeneral: Awake, alert, NAD, on dialysisHEENT: NC/ATCardiovascular: RRRPulmonary: CTAB, normal WOB on room airAbd distendedExt: no BLE edema noted, R arm in slingNeurological: answers questions appropriately, no focal deficitsAccess: HDTC, c/d/I, in use for dialysis Metabolic/Bone/NutritionRecent Labs Lab 09/08/252324 09/08/252015 09/08/251214 09/07/251039 09/07/250248 09/05/250151 09/04/250625 09/03/252134 Sodium 131*  --  133 136 137   < > 132*  --  Potassium 5.2* 5.4* 5.9* 5.1 5.2*   < > 5.6*  --  Chloride 91*  --  91* 94* 94*   < > 87*  --  CO2 20  --  20 23 24    < > 20  --  UN 55*  --  50* 34* 29*   < > 56*  --  Creatinine 6.80*  --  6.31* 4.63* 4.12*   < > 7.53*  --  Glucose 97  --  83 88 98   < > 82  --  Calcium  8.4*  --  8.3* 8.0* 8.1*   < > 7.9*  --  Albumin 3.1*  --   --   --   --   --  3.2* 3.3* Phosphorus 5.9*  --  5.6*  --  5.1*   < > 5.6*  --   < > = values in this interval not displayed.   Component Value Date/Time  PTH 309.0 (H) 10/17/2023 1644  Anemia Recent Labs Lab 09/08/252324 09/08/251214 09/07/250248 WBC 12.6* 13.9* 12.1* Hemoglobin 9.7* 9.8* 9.9* Hematocrit 32* 33* 34* Platelets 633* 607* 468*   Component Value Date/Time  FE 65 10/17/2023 1644  IBC 176 (L) 10/17/2023 1644  FESAT 37 10/17/2023 1644  FER 881 (H) 10/17/2023 1644  IR body embolizationResult Date: 9/8/2025Preoperative embolization of branches off of the right posterior circumflex humeral  artery and deep brachial artery using a combination of coil embolization and bland bead embolization with significantly reduced right humeral tumoral blush on completion angiography. Plan: Bedrest with right leg straight for 2 hours post procedure END OF IMPRESSIONPortable US  doppler vein RIGHT upper extremityResult Date: 9/7/2025No evidence of deep venous thrombosis in the right upper extremity. Internal jugular could not be evaluated due to large dressing. END OF IMPRESSION Chest single frontal viewResult Date: 9/6/2025Interval placement of a right IJ central venous catheter with tip projecting over the region of the right internal jugular vein. Consider repositioning. No definite pneumothorax. No visualized guidewire. Unchanged layering left pleural fluid and left mid and lower lung opacities. Unchanged pulmonary edema. END OF IMPRESSION Femur RIGHT standard AP and LateralResult Date: 9/6/2025Please see operative note for further procedural details. END OF IMPRESSION  Assessment/PlanGreg Pearson is a 63 y.o. 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 Ortho being consulted. Nephrology consulted for ESRD and provision of dialysis. Hemodialysis Note1. ESRD suspected to be 2/2 HTN on HD TTS at [facility]  with rx belowF180 Na 138 K 2 Ca 2.5 HCO3 35 Qb: 400 ml/min Qd: 1.5X Qb UF 1-2.5 L as hemodynamics tolerates Renally dose medications for CrCl < 10 mL/minContinue NephroviteEDW 75 kgFollow chemistries ElectrolytesAcceptable  Acid-BaseWill keep within desired goal range (serum bicarb 22-25) with RRT against 35 bicarb Anemia: Goal hemoglobin of 10-11 gm/dl.Hgb 10.1ESA management per Heme/OncCKD-MBD: Hypocalcemia  noted, would check iCa and keep > 3.5Would restart home calcitriol  0.75 mcg qTTSPhosphorus acceptableContinue Sensipar  60 mg daily ( iPTH 759 on 10/24/23)  Hemodynamics/ Orthostatic hypotensionSBP range: 100's - 140'sUF with HD as able, attempting 2LOn Midodrine  10 mg TID + florinef  Access: HDTCUnstable on ID- ? Hold OR todayAuthor: DONNICE JONETTA SCHULTZE, MD  as of: 11/19/2023  at: 9:19 AM

## 2023-11-19 NOTE — Plan of Care (Signed)
 Brief Plan of Care notePatient seen and examined with covering APP during HD. Overnight had multiple episodes of hypotension. Hypotension continues this morning with SBP fluctuating from 70's-low 90's. He's gotten almost 3 L of IVF without significant improvement (2 L overnight, 700 cc with HD with essentially no fluid removal). Remains afebrile with stable, mild leukocytosis. BCx obtained 9/8 NGTD. Lactic acidosis improved with IVF. On exam, he is very distended and reports not having a BM in nearly 1 week. He is breathing comfortably on RA. He seems a bit more confused today - possibly d/t delirium vs LMD noted on MRI. Plan:- will obtain CT C/A/P without contrast to ensure we're not missing an infection. As of now he is NOT optimized for surgery. Ortho team updated.Full note to follow later in the day.Shane Cavalier, MD

## 2023-11-19 NOTE — Progress Notes (Signed)
 Vancomycin  (initial dosing) - HD/AKI/CRRT/PDVancomycin has been initiated for Empiric Therapy Vancomycin  Monitoring Strategy and Goal: Dose by level < 20 mcg/mlRenal Function: Intermittent HD Will start vancomycin  1500 mg x 1 at 1300. Vancomycin  concentration to be drawn on 11/21/23 at 0600Pharmacist will follow clinical course and will order vancomycin  concentrations as indicated.For questions contact pharmacy at extension [phone].Titianna Loomis C Khamiyah Grefe, PharmD

## 2023-11-19 NOTE — Progress Notes (Signed)
 Report Given ToPrimary nurse over the phonePt tolerated 2 hours of tx. Removed net fluid of: +0.9  L.Meds given during tx: Florinet and heparin  dwells Vitals during tx: Asymptomatic HypotensionComments: tx ended 5 mins early d/t low BP. Providers made aware. Pt stable post tx Descriptive Sentence / Reason for Admission Duration of treatment (mins): 120 .Complications: Asymptomatic Hypotension .Active Issues / Relevant Events Review of meds administered: See [facility] of vitals: See FlowsheetsTo Do ListMedications still needing administration: None r/t dialysis.Dressing change due: 9/11/2025Anticipatory Guidance / Discharge PlanningDate and time of next HD tx: TBD by NephrologyObtain daily weight for accurate fluid removal!

## 2023-11-19 NOTE — Plan of Care (Signed)
 SEPSIS ALERT9/11/2023 at: 1:21 PM Activating Unit: [facility]  Reason for Activation: Hypotension refractory to fluid resuscitationGreg Pearson is a 63 y.o. with ESRD ISO Polycystic kidney disease, Stage IV RCC, here w/ pathologic humerus fracture. SEPSIS alert was called for persistent hypotension.Received approximately 3.5 L of volume in the last 12 hours. Despite this systolics remain in the 70-80s. Per bedside nursing has had worsening mentation/lethargy.No fevers, chills. No chest pain. No headaches. No shortness of breath.Recent HD line placement. IR coil embolization of RUE bleed. Pending RTOR for RUE biopsy & IMN.Temp:  [35.4 C (95.7 F)-36.5 C (97.7 F)] 36.2 C (97.2 F)Heart Rate:  [90-105] 105Resp:  [12-20] 16BP: (64-130)/(0-111) 81/53   Lab results: 09/08/252324 09/08/252015 09/08/251214 09/07/251039 Sodium 131*  --  133 136 Potassium 5.2* 5.4* 5.9* 5.1 Chloride 91*  --  91* 94* CO2 20  --  20 23 UN 55*  --  50* 34* Creatinine 6.80*  --  6.31* 4.63* Glucose 97  --  83 88 Calcium  8.4*  --  8.3* 8.0*   Lab results: 09/09/251237 09/08/252324 09/08/251214 09/06/250820 09/05/250151 09/03/251959 08/07/251644 07/31/250823 07/30/250536 07/29/250743 WBC 14.5* 12.6* 13.9*   < > 10.5 10.9   < > 13.8* 14.3* 12.9* Hemoglobin 9.5* 9.7* 9.8*   < > 10.1* 10.4*   < > 9.4* 9.8* 9.3* Hematocrit 32* 32* 33*   < > 33* 36*   < > 32* 33* 31* RBC 3.5* 3.6* 3.7*   < > 3.8* 3.9*   < > 3.9* 4.0 3.7* Platelets 639* 633* 607*   < > 428 337   < > 538* 512* 494* Neut # K/uL  --  11.2*  --   --  9.1* 9.4*   < > 10.6* 11.6* 10.6* Lymph # K/uL  --  0.6*  --   --  0.6* 0.7*   < > 1.4 1.4 0.5* Mono # K/uL  --  0.3  --   --  0.7 0.7   < > 1.1* 1.1* 1.0 Eos # K/uL  --  0.0  --   --  0.0 0.0   < > 0.4 0.1 0.3 Baso # K/uL  --  0.0  --   --  0.0 0.1   < > 0.1 0.1 0.2 Seg Neut %  --  88.8  --   --  86.4 85.8   < > 77.4 77.5 81.5  Bands %  --   --   --   --   --   --   --   --  3  --  Myelocyte %  --  3*  --   --   --   --   --  1*  --  1* Metamyelocyte %  --   --   --   --   --   --   --   --   --  1  < > = values in this interval not displayed.  General: Somnolent, arouses to voice but quickly returns to sleep.HEENT: dry. MM. Pupils equal & round. No facial droop.NECK: No meningismus.Cardiac: Tachycardic rate and rhythm. No murmurs or rubs.Respiratory: Clear to auscultation bilaterally. No wheezes or rales. Non-labored breathing without accessory muscle use.Abdominal: Soft, distended. + caput medusae.MSK/Extremities: + peripheral edema present.Skin: Warm and dry with no rashes or wounds to exposed skin.Neuro/Psych: grip strength intact bilaterally. Follows commands appropriately. Oriented, although took 1 min of unprompted answering to provide with correct answers.Recommended plan:- Obtain OVBG or OABG- Obtain CXR- Obtain cultures (blood)- Obtain  labwork (CBC, BMP, lactate, RUQ panel)- defer additional IV fluids in favor of pressors.- Start on broad-spectrum antibiotics- Check EKG- Place on telemetry- NTpro-BNP- Will plan for admission to ICUPatient response: Stabilized.Disposition: Admit to ICU for pressors.Plan communicated with: bedside RN, CRN, RRT, primary team, MICU fellowPlease do not hesitate to reach out or call another rapid response should this patient decompensate.Addley Ballinger, MDPulmonology/Critical Care PGY-2

## 2023-11-19 NOTE — H&P (Addendum)
 MICU Attending Critical Care H&P/ConsultReason for Consult:hypotensionHPI: Pt is a 63 y.o. male who presented with chief complaint of mechanical fall on 9/3 found to have a right pathologic humerus fracture s/p IR embolization. He has a PMHx of ESRD on HD, adrenal insufficiency, and metastatic renal cell carcinoma with mets to brain and bone. He has had intermittent hypotension during admission and was given IVF boluses. He developed worsening hypotension during HD with BP in 70/50s and was transferred to MICU and started on levophed . 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, initial encounter 11/13/2023  Secondary malignant neoplasm of bone 11/13/2023  Leg weakness 10/17/2023  History of temporal artery biopsy 10/11/2023  Diarrhea 09/29/2023  Failure to thrive in adult 09/29/2023  HLD (hyperlipidemia) 11/05/2017  Hypotension 11/05/2017  Ischemic colitis 11/05/2017  Spondylosis of lumbosacral region without myelopathy or radiculopathy 05/08/2017  Embolism due to vascular prosthetic devices, implants and grafts, initial encounter 04/10/2017  Degeneration of intervertebral disc of lumbar region 03/27/2017  ESRD (end stage renal disease) on dialysis 11/29/2016  Arteriosclerosis of artery of extremity 01/08/2013  Allergic rhinitis 05/24/2012   Mapped by script on 05/24/2012 from Allergic Rhinitis Nos                             ,477.9 Past Medical History[1] Past Surgical History[2] Medications Prior to Admission Medication Sig  dexAMETHasone  (DECADRON ) 2 MG tablet Take 1 tablet (2 mg total) by mouth 2 times daily (with meals) for cord compression.  acetaminophen  (TYLENOL ) 500 mg tablet Take 1  tablet (500 mg total) by mouth 3 times daily as needed for Pain for Pain. Take 2 tabs (1000mg ) by mouth three times a day as needed for pain. MDD 3000mg .  midodrine  (PROAMATINE ) 10 mg tablet Take 1 tablet (10 mg total) by mouth 3 times daily for Disorder of Low Blood Pressure.  pantoprazole  (PROTONIX ) 40 mg EC tablet Take 1 tablet (40 mg total) by mouth every morning. Swallow whole. Do not crush, break, or chew.  magnesium  oxide (MAGNESIUM  OXIDE -MG SUPPLEMENT) 400 (241.3 mg) mg tablet Take 1 tablet (400 mg total) by mouth daily.  aspirin  81 MG EC tablet Take 1 tablet (81 mg total) by mouth daily for DVT prophylaxis.  fluticasone  (FLONASE ) 50 MCG/ACT nasal spray Spray 1 spray into nostril daily for allergies.  Spray 1 spray each nares  atorvastatin  (LIPITOR) 20 mg tablet Take 1 tablet (20 mg total) by mouth daily (with dinner) for High Amount of Fats in the Blood.  fludrocortisone  (FLORINEF ) 0.1 mg tablet Take 1 tablet (100 mcg total) by mouth 3 times daily for Blood Pressure Drop Upon Standing.  b complex-vitamin c-folic acid  (NEPHRO-VITE) tablet Take 1 tablet by mouth daily for Complex vitamin.  calcium -vitamin D  (OSCAL-500) 500-200 MG-UNIT per tablet Take 1 tablet by mouth 2 times daily for Vit D.  cinacalcet  (SENSIPAR ) 30 MG tablet Take 2 tablets (60 mg total) by mouth every evening for parathyroid.  Commode, adjustable 3-in-1 for Cancer that has Spread From One Place to Another. Use as directed  walker Exact equipment needed: Rolling walker Specific length of time needed: LifetimeICD10 Code: R26.2 Ht: 5' 5   Wt: 175 lbs Allergies[3] Social History Tobacco Use  Smoking status: Former   Packs/day:  0.30   Years: 15.00   Additional pack years: 0.00   Total pack years: 4.50   Types: Cigarettes   Quit date: 08/02/1992   Years since quitting: 31.3  Smokeless tobacco: Never Substance Use Topics  Alcohol use: No  Family History[4] Review of  Systems:Review of Systems Constitutional:  Positive for malaise/fatigue. Musculoskeletal:  Positive for back pain. All other systems reviewed and are negative.Scheduled Meds: [START ON 11/20/2023] ceFEPime  (MAXIPIME ) IV  1,000 mg Intravenous Q24H  metroNIDAZOLE   500 mg Intravenous Q8H  vancomycin   1,500 mg Intravenous Once  senna  2 tablet Oral BID  polyethylene glycol  17 g Oral BID  atorvastatin   20 mg Oral Daily with dinner  B complex-vitamin C-folic acid   1 tablet Oral Daily  cinacalcet   60 mg Oral QPM  dexAMETHasone   2 mg Oral BID WC  fludrocortisone   100 mcg Oral TID  fluticasone   1 spray Nasal Daily  magnesium  oxide  400 mg Oral Daily  midodrine   10 mg Oral TID  pantoprazole   40 mg Oral QAM  acetaminophen   1,000 mg Oral TID Continuous Infusions: Vancomycin  - Pharmacist to Dose    NORepinephrine  2 mcg/min (11/19/23 1450) PRN Meds:oxyCODONE  **OR** oxyCODONE , DiphenhydrAMINE -Lidocaine -Maalox (BMX/First Mouthwash), melatonin, heparin  (porcine), heparin  (porcine), sodium chloride , dextrose , naloxoneObjective:Vital signs in last 24 hours:Temp:  [35.4 C (95.7 F)-36.5 C (97.7 F)] 36.2 C (97.2 F)Heart Rate:  [91-112] 112Resp:  [14-20] 19BP: (64-130)/(0-111) 84/57Intake/Output last 3 shifts:I/O last 3 completed shifts:09/08 1500 - 09/09 1459In: 3703.3 (47.7 mL/kg) [I.V.:1252.4 (0.7 mL/kg/hr); IV Piggyback:2450.8]Out: 317 (4.1 mL/kg) [Other:317]Net: 6613.3Weight: 77.6 kg Intake/Output this shift:No intake/output data recorded.Vent settings for last 24 hours: Hemodynamic parameters for last 24 hours: Physical ExamConstitutional:     Comments: Somnolent but arousable, oriented x3 HENT:    Head: Normocephalic and atraumatic. Eyes:    Extraocular Movements: Extraocular movements intact.    Pupils: Pupils are equal, round, and reactive to light. Cardiovascular:    Rate and Rhythm: Normal rate.  Rhythm irregular. Pulmonary:    Effort: Pulmonary effort is normal.    Breath sounds: Normal breath sounds. Abdominal:    General: Abdomen is flat.    Palpations: Abdomen is soft. Musculoskeletal:       General: No swelling. Normal range of motion. Skin:   General: Skin is warm and dry. Neurological:    Mental Status: He is oriented to person, place, and time. Mental status is at baseline. Assessment/Plan:Principal Problem:  Other closed displaced fracture of proximal end of right humerus, initial encounterActive Problems:  ESRD (end stage renal disease) on dialysisOverall assessment: Patient is a 63 yo M with metastatic RCC, ESRD on HD, adrenal insufficiency who was admitted on 9/3 with mechanical fall and right humeral fracture s/p IR embolization. Plan was for IM fixation today, but surgery was delayed with transfer to MICU for hypotension. 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 Neuro/pain: Humeral fracture- ortho following and plan for medical optimization prior to OR. Continue immobilization. Pain control with oxy prn. Enlarging metastatic brain lesions with possible hemorrhagic conversion-will discuss chemical ppx with neurosurgeryPulmonary: Acute Hypoxia on 2Lnc-CT chest reviewed with compressive atelectasis vs infiltrate on left and large left effusion. Will hold on further fluid boluses. Continue to titrate oxygen. Sputum culture if able. Left pleural effusion- plan for thoracentesis and studiesCardiovascular: Hypotension-likely multifactorial from chronic hypotension, possible sepsis, relative adrenal insufficiency, EKG non ischemic. Echo ordered. LE doppler ordered. H/H stable. Stress dose steroids. Levophed  started. May need higher midodrine   dose. Renal: ESRD on HD-nephrology following. Last session todayGI/Nutrition: CT abdomen with colonic distention, concern for ileus, will make NPO  and trial suppository for BM. LFTs WNL. ID: Suspect septic shock secondary to pneumonia- continue broad spectrum antibiotics. Lactate normal at 1.9. Blood cultures pendingHeme/onc: Metastatic RCC- oncology following. WBRT plan on hold. Discussed GOC with patient and his wife. They will have ongoing discussion, but patient states he would not want to be kept alive on machines and that his wife knows what he wantsEndocrine: adrenal insufficiency-increased to stress dose steroids following trauma and need for vasopressors. Cortisol random 10.5 overnight.Code status/Advance Directive: Full CodeDVT ppx: lovenoxMichelle Trung Wenzl, DOAttending PhysicianCritical Care Medicine3:22 PM9/9/2025Critical Care Time: 45 MinutesI spent 45 cumulative minutes, in full attention to this critically ill patient, who has a high probability of life threatening deterioration in their clinical condition due to shock, metastatic cancer, pneumonia.  I provided close monitoring and interventions as described in my assessment and plan. [1] Past Medical History:Diagnosis Date  Arthritis   CHF (congestive heart failure)   Colon polyp   Dialysis patient   ESRD (end stage renal disease)   GERD (gastroesophageal reflux disease)   Hypercholesterolemia   Hypotension   Ischemic colitis   Thyroid  disease   hypo [2] Past Surgical History:Procedure Laterality Date  DIALYSIS FISTULA CREATION Bilateral   4 fistulas have failed- ? d/t low BP and small veins  HERNIA REPAIR    PR LIGATION/BIOPSY TEMPORAL ARTERY Right 10/04/2023  Procedure: TEMPORAL ARTERY BIOPSY RIGHT EYE;  Surgeon: Lynn Ervin Edelman, MD;  [facility] MA[facility];  Service: Ophthalmology  Right inguinal hernia repair   [3] AllergiesAllergen Reactions  Penicillins Swelling and Rash   Facial swellingMed History Tech discussed Penicillin allergy with patient on 10/18/23. Please see  Antimicrobial Stewardship Chart Note for additional details.  Amoxicillin Swelling and Rash   Facial swellingMed History Tech discussed Amoxicillin allergy with patient on 10/18/23. Please see Antimicrobial Stewardship Chart Note for additional details.  Baclofen Other (See Comments)   confusion  Coconut Nausea And Vomiting  No Known Latex Allergy  [4] Family HistoryProblem Relation Name Age of Onset  Diabetes Mother    Asthma Mother    Coronary artery disease Father

## 2023-11-19 NOTE — Progress Notes (Signed)
 Assumed care of patient 0300-transfer .  Low BP, provider notified and ordered 1 time Midodrine  per orders and 500 NS bolus, BP still not responding, given another 500 NS bolus, Pt's BP still remains low. Pt came back from dialysis and pressures were still low, provider notified, sepsis call activated, labs drawn results pending, pt transferred to 636i. other VSS.  Please refer to patient flowsheets and eMAR for further assessments and documentation.

## 2023-11-19 NOTE — Progress Notes (Addendum)
 [place] [facility]  Subjective:Had episodes of hypotension to 70/60/30's thought to be due to patient being NPO yesterday responded slightly to bolus of fluids. Cortisol ordered by primary team found to be normal . Underwent IR embolization yesterday of right humerus. Had left femoral line placed for access, Right IJ was removed.  Pain well controlled. Tolerating PO. Denies fevers, chills, chest pain, shortness of breath, nausea, or vomiting. HCT 32 (34). Intra-op R femur reaminngs pathology remains pending. Slotted 1st HD session.ObjectiveLast Filed Vitals  11/19/23 0525 BP: (!) 85/51 Pulse:  Resp:  Temp:  SpO2:  General: NAD, Pleasant, AppropriateRespiratory: Breathing comfortably, No evidence of respiratory distressCardiac: RRRRUE exam: Sling in place. Able to give thumbs up, make composite fist, abduct and adduct fingers, extend and flex wrist. SILT along the 1st DWS, deltoid, volar index/small fingers. Digits WWP.right lower extremity ExamDressing clean, dry, intact. Motor intact  ankle dorsiflexion/plantarflexion, toe flexion/extension.Sensation intact to light touch 1st dorsal web space/medial/lateral/plantar/dorsal foot. 2+ dorsalis pedis/posterior tibialis pulse, toes warm and well-perfused, < 3 second capillary refill.LLE Exam: Motor intact  ankle dorsiflexion/plantarflexion, toe flexion/extension.Sensation intact to light touch 1st dorsal web space/medial/lateral/plantar/dorsal foot. 2+ dorsalis pedis/posterior tibialis pulse, toes warm and well-perfused, < 3 second capillary refill.Recent Labs Lab 09/08/252324 09/08/251214 09/07/250248 WBC 12.6* 13.9* 12.1* Hemoglobin 9.7* 9.8* 9.9* Hematocrit 32* 33* 34* Platelets 633* 607* 468* Recent Labs Lab 09/08/252324 09/08/252015 09/08/251214 09/07/251039 Sodium 131*  --  133 136 Potassium 5.2* 5.4* 5.9* 5.1 Chloride 91*  --  91*  94* CO2 20  --  20 23 No components found with this basename: BUN, LABGLOM, CALCIUMRecent Labs Lab 09/08/252324 09/08/251214 09/06/250820 INR 1.5* 1.4* 1.1 No results for input(s): ESR, CRP in the last 168 hours.Imaging: NoneAssessment/Plan: [name] is a 63 y.o. male POD  3  s/p R femur prophylactic CMN on 11/19/2023. Also with R proximal humerus pathologic fracture  and L femur lesions pending stabilization. Planned return to operating room: Yes tentative for Tuesday (09/09)  for R Humerus with ortho oncology following embolization of right proximal humerus lesion by IR on Monday (09/08)Plan for OR Today for R humerus following HD sessionPain Management: MMDVT prophylaxis and duration: per primary rec Lovenox  if ableWeight bearing status and activity restrictions: WBAT RLE, WBAT LLE W/ walker, NWB RUE in sling Wound/Pin Care: POD7 Mepilex change RLEPostoperative x-rays and radiographic studies: NoneDiet: ADAT Foley:  N.a Additional Specifics (as needed):  F.u reaming pathologyAnticipated  follow-up date: 2 weeks for right hipHashim Pearson, MDOrthopaedic Surgery9/11/2023 5:52 AM

## 2023-11-19 NOTE — Significant Event (Signed)
 11/19/23 1155 Reason for Activating Rapid Response Team Reason for Activating RRT Hypotension SPB < 90 mmHg or MAP < 65;Concern For Sepsis;Staff Concern RRT Initiated Date 11/19/23 Time Called 1206 Activating Unit [facility] Activated by RN Arrival Time 1209 End Time 1330 Responding Unit [facility] RRT  Provider Notified at 1206 RRT Vitals  BP (!) 75/51 SpO2 95 % Oximetry Source Lt Hand O2 Therapy None (Room air) NEWS Score Total 5 Sepsis Predictive Model Score 14 Current Pain Assessment Pain Assessment / Reassessment Assessment Pain Scale 0-10 (Numeric Scale for Pain Intensity) 0-10  Pain Scale 0 No Intervention/MAR Intervention(s) No Intervention required Interventions/Recomendations Interventions / Recomendations ABG;IV Fluids;Labs;CXR;Monitor;Blood CX Medications Given Other (Comment)(abx) Consults ICU Patient Outcome Patient Stabilized Y Blue 100 Called N Code 15/ Stroke Team Called N Patient Transfered to ICU Team Members Assigned Nurse Olam Crimes RN and Eleanor Marc RN Rapid Response Team RN Olam Brasil RN and Mariel Maycumber RN Rapid Response Team Provider Debby Bone MD MICU Other Reynolds Cavalier MD and Chrystine Kling PA

## 2023-11-20 ENCOUNTER — Inpatient Hospital Stay

## 2023-11-20 DIAGNOSIS — I1311 Hypertensive heart and chronic kidney disease without heart failure, with stage 5 chronic kidney disease, or end stage renal disease: Secondary | ICD-10-CM

## 2023-11-20 DIAGNOSIS — R0600 Dyspnea, unspecified: Secondary | ICD-10-CM

## 2023-11-20 DIAGNOSIS — K810 Acute cholecystitis: Secondary | ICD-10-CM

## 2023-11-20 DIAGNOSIS — R627 Adult failure to thrive: Secondary | ICD-10-CM

## 2023-11-20 LAB — CBC AND DIFFERENTIAL
Baso # K/uL: 0 THOU/uL (ref 0.0–0.2)
Eos # K/uL: 0 THOU/uL (ref 0.0–0.5)
Hematocrit: 34 % — ABNORMAL LOW (ref 37–52)
Hemoglobin: 10.1 g/dL — ABNORMAL LOW (ref 12.0–17.0)
Lymph # K/uL: 0.9 THOU/uL — ABNORMAL LOW (ref 1.0–5.0)
MCV: 91 fL (ref 75–100)
Mono # K/uL: 2.1 THOU/uL — ABNORMAL HIGH (ref 0.1–1.0)
Neut # K/uL: 18.1 THOU/uL — ABNORMAL HIGH (ref 1.5–6.5)
Nucl RBC # K/uL: 3.3 THOU/uL — ABNORMAL HIGH (ref 0.0–0.1)
Nucl RBC %: 15.2 /100{WBCs} — ABNORMAL HIGH (ref 0.0–0.2)
Platelets: 729 THOU/uL — ABNORMAL HIGH (ref 150–450)
RBC: 3.7 MIL/uL — ABNORMAL LOW (ref 4.0–6.0)
RDW: 24 % — ABNORMAL HIGH (ref 0.0–15.0)
Seg Neut %: 83.1 %
WBC: 21.6 THOU/uL — ABNORMAL HIGH (ref 3.5–11.0)

## 2023-11-20 LAB — VENOUS GASES / WHOLE BLOOD PANEL
Base Excess,VENOUS: -5 mmol/L — ABNORMAL LOW (ref <=2)
Bicarbonate,VENOUS: 21 mmol/L (ref 21–28)
CO2 (Calc),VENOUS: 22 mmol/L (ref 22–31)
CO: 1.6 %
Chloride,WB: 96 mmol/L (ref 96–108)
FO2 HB,VENOUS: 70 % (ref 63–83)
Glucose,WB: 90 mg/dL (ref 60–99)
Hemoglobin: 10.5 g/dL — ABNORMAL LOW (ref 12.0–17.0)
ICA @7.4,WB: 4.2 mg/dL — ABNORMAL LOW (ref 4.8–5.2)
ICA Uncorr,WB: 4.3 mg/dL — ABNORMAL LOW (ref 4.8–5.2)
Lactate VEN,WB: 2.4 mmol/L — ABNORMAL HIGH (ref 0.5–2.2)
Methemoglobin: 2 % — ABNORMAL HIGH (ref 0.0–1.0)
NA, WB: 127 mmol/L — ABNORMAL LOW (ref 135–145)
PCO2,VENOUS: 41 mmHg (ref 40–50)
PH,VENOUS: 7.31 — ABNORMAL LOW (ref 7.32–7.42)
PO2,VENOUS: 47 mmHg — ABNORMAL HIGH (ref 30–43)
Potassium,WB: 5 mmol/L — ABNORMAL HIGH (ref 3.3–4.6)

## 2023-11-20 LAB — ECHO COMPLETE
Aortic Diameter (mid tubular): 3.3 cm
Aortic Diameter (sinus of Valsalva): 3.3 cm
BMI: 25.25 kg/m2
BP Diastolic: 74 mmHg
BP Systolic: 111 mmHg
BSA: 1.94 m2
EF: 75 %
Heart Rate: 98 {beats}/min
Height: 69 in
IVC Diameter: 1.6 cm
LV ASE Mass BSA Index: 32.5 g/m2
LV ASE Mass Height 2.7 Index: 13.9 g/m2
LV ASE Mass Height Index: 36 gm/m
LV ASE Mass: 63.1 g
LV Posterior Wall Thickness: 0.71 cm
LV Septal Thickness: 0.7 cm
LV wall/cavity ratio: 0.4
LVED Diameter BSA Index: 1.8 cm/m2
LVED Diameter Height Index: 1.99 cm/m
LVED Diameter: 3.49 cm
LVOT Area (calculated): 2.72 cm2
LVOT Diameter: 1.86 cm
RR Interval: 612.24 ms
Weight (lbs): 171 [lb_av]
Weight: 2736 [oz_av]

## 2023-11-20 LAB — BASIC METABOLIC PANEL
Anion Gap: 16 (ref 7–16)
CO2: 23 mmol/L (ref 20–28)
Calcium: 8.7 mg/dL (ref 8.6–10.2)
Chloride: 94 mmol/L — ABNORMAL LOW (ref 96–108)
Creatinine: 5.21 mg/dL — ABNORMAL HIGH (ref 0.67–1.17)
Glucose: 84 mg/dL (ref 60–99)
Lab: 41 mg/dL — ABNORMAL HIGH (ref 6–20)
Potassium: 5.2 mmol/L — ABNORMAL HIGH (ref 3.3–5.1)
Sodium: 133 mmol/L (ref 133–145)
eGFR BY CREAT: 12 — AB

## 2023-11-20 LAB — EKG 12-LEAD
P: 67 deg
PR: 155 ms
QRS: 63 deg
QRSD: 84 ms
QT: 346 ms
QTc: 430 ms
Rate: 93 {beats}/min
T: 69 deg

## 2023-11-20 LAB — DIFF MANUAL
Bands %: 1 % (ref 0–10)
Diff Based On: 124 {cells}
Metamyelocyte %: 1 % (ref 0–1)
Myelocyte %: 2 % — ABNORMAL HIGH

## 2023-11-20 LAB — PROTIME-INR
INR: 2 — ABNORMAL HIGH (ref 0.9–1.1)
Protime: 22.7 s — ABNORMAL HIGH (ref 10.0–12.9)

## 2023-11-20 LAB — GRAM STAIN

## 2023-11-20 LAB — PHOSPHORUS: Phosphorus: 5.2 mg/dL — ABNORMAL HIGH (ref 2.7–4.5)

## 2023-11-20 LAB — TYPE AND SCREEN
ABO RH Blood Type: A POS
Antibody Screen: NEGATIVE

## 2023-11-20 LAB — MAGNESIUM: Magnesium: 1.9 mg/dL (ref 1.6–2.5)

## 2023-11-20 MED ORDER — CEFTRIAXONE SODIUM 1 G IN STERILE WATER 10ML SYRINGE *I*
Freq: Once | INTRAVENOUS | Status: AC | PRN
Start: 2023-11-20 — End: 2023-11-20
  Administered 2023-11-20: 1000 mg via INTRAVENOUS

## 2023-11-20 MED ORDER — LIDOCAINE HCL 1 % IJ SOLN *I*
INTRAMUSCULAR | Status: AC
Start: 2023-11-20 — End: 2023-11-20
  Filled 2023-11-20: qty 20

## 2023-11-20 MED ORDER — PLASMA-LYTE IV BOLUS *WRAPPED*
500.0000 mL | Freq: Once | INTRAVENOUS | Status: AC
Start: 2023-11-20 — End: 2023-11-20
  Administered 2023-11-20: 500 mL via INTRAVENOUS

## 2023-11-20 MED ORDER — CEFTRIAXONE SODIUM 1 G IN STERILE WATER 10ML SYRINGE *I*
INTRAVENOUS | Status: AC
Start: 2023-11-20 — End: 2023-11-20
  Filled 2023-11-20: qty 10

## 2023-11-20 MED ORDER — LIDOCAINE HCL 1 % IJ SOLN *I*
Freq: Once | INTRAMUSCULAR | Status: AC | PRN
Start: 2023-11-20 — End: 2023-11-20
  Administered 2023-11-20: 10 mL via SUBCUTANEOUS

## 2023-11-20 MED ORDER — HALOPERIDOL LACTATE 5 MG/ML IJ SOLN *I*
2.0000 mg | Freq: Once | INTRAMUSCULAR | Status: AC
Start: 2023-11-20 — End: 2023-11-20
  Administered 2023-11-20: 2 mg via INTRAVENOUS
  Filled 2023-11-20: qty 1

## 2023-11-20 MED ORDER — PERFLUTREN PROTEIN A MICROSPH (OPTISON) IV SUSP *I*
1.5000 mL | INTRAVENOUS | Status: AC | PRN
Start: 2023-11-20 — End: 2023-11-21
  Administered 2023-11-20: 1.5 mL via INTRAVENOUS

## 2023-11-20 NOTE — Progress Notes (Signed)
 Nephrology Progress Note LOS: 7 days Interval HistoryHypotensive with HD yesterday, only 2 hours completed and ended + 0.9 L.Transferred to ICU for shock, now on norepi and s/p perc biliary drain placement.Subjective Patient seen & examined at bedside.Denies SOB, CP, N/V/DObjective  Current Vitals Vitals Range (24 Hours) BP 92/66 (BP Location: Left arm)   Pulse 98   Temp 36.8 C (98.2 F) (Axillary)   Resp 16   Ht 1.753 m (5' 9)   Wt 77.6 kg (171 lb)   SpO2 96%   BMI 25.25 kg/m  BP: (72-109)/(0-70) Temp:  [36.8 C (98.2 F)-36.9 C (98.4 F)] Temp src: Axillary (09/10 0800)Heart Rate:  [83-118] Resp:  [11-24] SpO2:  [88 %-100 %]  I/Os Weight I/O last 3 completed shifts:09/09 0700 - 09/10 0659In: 5033.4 (64.9 mL/kg) [P.O.:150; I.V.:1583.5 (0.9 mL/kg/hr); IV Piggyback:3299.9]Out: 1317 (17 mL/kg) [Emesis/NG output:1000; Other:317]Net: 3716.4Weight: 77.6 kg  Last 4 Weights  Physical Exam:Constitutional:General: Not in acute distress, laying in bedAppearance: ill-appearingHENT:Eyes: Anicteric scleraeHead: Normocephalic and atraumaticCVS: Rate and Rhythm: Normal rate and rhythmEdema:  peripheral edema bilaterallyPulmonary: Effort: No respiratory distressBreath sounds: CTAB anteriorly GI: distendedAccess: HDTC, dressing CDIRecent Labs Lab 09/09/252352 09/09/251237 09/08/252324 09/05/250151 09/04/250625 Sodium 133 136 131*   < > 132* Potassium 5.2* 4.9 5.2*   < > 5.6* Chloride 94* 94* 91*   < > 87* CO2 23 19* 20   < > 20 Anion Gap 16 23* 20*   < > 25* UN 41* 35* 55*   < > 56* Creatinine 5.21* 4.72* 6.80*   < > 7.53* Glucose 84 68 97   < > 82 Calcium  8.7 8.1* 8.4*   < > 7.9* Albumin  --  3.0* 3.1*  --  3.2* Phosphorus 5.2* 4.5 5.9*   < > 5.6* Magnesium  1.9 1.8 1.9   < > 1.8  < > = values in this interval not displayed. No results found for: BKVQLNo results found for:  CMVQLAerobic bacterial pleural or thoracentesis fluid culture and stain Date Value Ref Range Status 10/01/2023 Lab Cancel  Final Bacterial Blood Culture Date Value Ref Range Status 11/19/2023 .  Preliminary 11/19/2023 .  Preliminary  Recent Labs Lab 09/10/250205 09/09/251237 09/08/252324 WBC 21.6* 14.5* 12.6* Hemoglobin 10.1* 9.5* 9.7* Hematocrit 34* 32* 32* Platelets 729* 639* 633*   Component Value Date/Time  FE 65 10/17/2023 1644  IBC 176 (L) 10/17/2023 1644  FESAT 37 10/17/2023 1644  FER 881 (H) 10/17/2023 1644 No results for input(s): TACRR, SIR, CSAM in the last 168 hours. No results for input(s): JOLINDA PLEVA, [place], [place], [place], [place], UOSMO in the last 168 hours.No results for input(s): UAPP, UCOL, UAGLU, KETONESU, USG, UBLD, UAPH, UPRO, UNITR, ULEU, URBC, UWBC, UMUC, [place], [address], SPECIFICGRAV, BLOODUA, PHUA, PROTEIN, NITRITEUA, LEUKESTERASE, GLUCOSESIE, KETONESIEM, PUSG, BLOODSIEMEN, PUAPH, PROTEINUA, PUNIT, LEUKOCYTESIE in the last 8760 hours. CT chest without contrastResult Date: 9/10/2025Shifting pleural fluid with interval increase in size of now large left pleural fluid with associated large volume atelectasis of the left lower lobe. Interval decrease in right pleural fluid with small residual loculated fluid. New groundglass opacities within the right middle and lower lobes concerning for infectious/inflammatory process. There are a few new nodules within the right middle and lower lobes as described above and may be concerning for sequela of pleural nodularity. Recommend follow-up CT chest in 3 months. Multiple soft tissue nodules within the anterior and posterior chest wall as described above. These are not significantly changed compared to prior exam. Recommend attention on future follow-up. Stable mediastinal and hilar lymphadenopathy  concerning for possible metastatic disease. Consider further evaluation with PET/CT and/or tissue sampling. Please see separately dictated CT abdomen and pelvis imaging report for detailed findings below the level of the diaphragm. END OF IMPRESSION I have personally reviewed the images and the Resident's/Fellow's interpretation and agree with or edited the findings. IR percutaneous cholecystostomy drainage and catheter placementResult Date: 9/10/2025Insertion of cholecystostomy tube with drainage of dark bile. Plan: Routine exchange in approximately 6 weeks. END OF IMPRESSIONPortable US  doppler vein bilateral lower extremitiesResult Date: 9/10/2025No evidence of acute deep venous thrombosis in the visualized vessels. The left common femoral vein and lower portions of the peroneal veins are not visualized. Chronic changes along the wall of the left popliteal vein. END OF IMPRESSION Portable US  abdomen limited single quadResult Date: 9/10/2025Distended gallbladder with sludge, gallbladder wall thickening and pericholecystic fluid. This is suspicious for evolving cholecystitis. Increased renal cortical echogenicity, as can be seen in the setting of medical renal disease. Multiple renal cysts. END OF IMPRESSION. CT abdomen and pelvis without contrastResult Date: 9/9/2025Severely dilated gallbladder. Correlation with ultrasound could be performed for further assessment if there is concern for cholecystitis. High density small volume perihepatic and pelvic fluid. This may represent blood products or proteinaceous ascites from other etiology. Correlation for clinical signs of bleeding is recommended. Borderline dilated right colon, which may represent an ileus. Small volume intermediate free pelvic fluid, which may represent hemoperitoneum. Multifocal nodal, soft tissue and intramuscular metastases. Diffuse osseous metastatic disease. Polycystic kidney disease. Large left pleural effusion  which is high density, see separately dictated chest CT for additional details. END OF IMPRESSION *Chest STANDARD single viewResult Date: 9/9/2025No change from previous study. Pulmonary edema and small effusions. Left basilar opacity probably representing atelectasis. END OF IMPRESSION IR body embolizationResult Date: 9/9/2025Preoperative embolization of branches off of the right posterior circumflex humeral artery and deep brachial artery using a combination of coil embolization and bland bead embolization with significantly reduced right humeral tumoral blush on completion angiography. Plan: Bedrest with right leg straight for 2 hours post procedure END OF IMPRESSION I was present for the critical and key portions of the procedure or visit and was immediately available in the suite to assist. I have personally reviewed the images and the Resident's/Fellow's interpretation and agree with or edited the findings. Portable US  doppler vein RIGHT upper extremityResult Date: 9/7/2025No evidence of deep venous thrombosis in the right upper extremity. Internal jugular could not be evaluated due to large dressing. END OF IMPRESSION  Current MedicationsScheduled Meds: ceFEPime  (MAXIPIME ) IV  1,000 mg Intravenous Q24H  metroNIDAZOLE   500 mg Intravenous Q8H  hydrocortisone  IV  50 mg Intravenous Q6H  enoxaparin   30 mg Subcutaneous Daily @ 2100  polyethylene glycol  17 g Oral 2 times per day  senna  2 tablet Oral BID  atorvastatin   20 mg Oral Daily with dinner  B complex-vitamin C-folic acid   1 tablet Oral Daily  cinacalcet   60 mg Oral QPM  [Held by provider] dexAMETHasone   2 mg Oral BID WC  fludrocortisone   100 mcg Oral TID  fluticasone   1 spray Nasal Daily  magnesium  oxide  400 mg Oral Daily  midodrine   10 mg Oral TID  pantoprazole   40 mg Oral QAM  acetaminophen   1,000 mg Oral TID Continuous Infusions: Vancomycin  - Pharmacist to Dose    NORepinephrine  14  mcg/min (11/20/23 0959) PRN Meds:oxyCODONE  **OR** oxyCODONE , DiphenhydrAMINE -Lidocaine -Maalox (BMX/First Mouthwash), melatonin, heparin  (porcine), heparin  (porcine), sodium chloride , dextrose , naloxone   AssessmentGreg Mondry is a 63 y.o. 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 on 9/3 from urgent care due to a mechanical fall resulting in right humerus fracture. Ortho being consulted. Nephrology consulted for ESRD and provision of dialysis. PlanESRD on HD:-Labs reviewed, mild hyperkalemia noted. Temporize and follow K level.-No acute indication for HD today.-Pt is on 14 mcg of norepi currently and unlikely to tolerate iHD. Will check for HD needs and hemodynamic stability tomorrow.-Dose adjust medications to CrCl <10 mL/s-Continue nephroviteDialysis access: -HDTCElectrolytes:-Most recent potassium level 5.0, temporize and notify nephrology for worsening hyperkalemia refractory to medical management.-Na 133CKD-BMD:-Ca 8.7-Phos 5.2-Continue cinacalcet  60 mgHemodynamics: HTN/Hypervolemia-Hypotensive, on norepinephrine .-Minimal o2 requirements.-Will remove fluid with HD as hemodynamics allow.Anemia: Hemoglobin goal (chronically) 10-11.5 gm/d-Hgb 10.1-No indication for ESA at this time.No urgent indications for dialysis today. Patient with no overt signs of fluid overload, labs fairly stable from nephrology standpoint. Next dialysis per schedule. Remainder of management per primary team.Thank you for allowing us  to participate in the care of this patient.Lonni Gordy Garter, NPNephrology DivisionUniversity of  Medical Center9/12/2023 11:21 AM

## 2023-11-20 NOTE — Provider Consult (Addendum)
 Emergency General Surgery Consult/H&P NoteReason for Consult: acute cholecystitisRequested by: MICUGallbladder Disease (including gallstone pancreatitis)Time consult requested: 0037Time consult seen: 0059History Shane Pearson is a 63 y.o. year old male with a past medical history significant for ESRD on HD, metastatic RCC w/ intracranial, spinal, and diffuse skeletal metastases, malignant left pleural effusion, CHF, chronic orthostatic HoTN, ischemic colitis and adrenal insufficiency presenting with worsening HoTN and found to have enlarged gallbladder on imaging.EGS consulted for management of possible cholecystitis. Patient presented after a mechanical fall on 9/3 and was found to have a right pathologuc humerus fracture s/p IR embolization and was awaiting further repair when he developed worsening HoTN requiring MICU admission. He denies any active nausea. He reports right-sided abdominal pain. Reports that he has not been able to have a bowel movement for several days and feels bloated. Minimal flatus. Has poor appetite at baseline.NG with ~500cc bilious output in canister.On exam, he is afebrile, tachycardic to the 100s, systolic 90s on levophed , and saturating well on 2L NC. Abdomen is distended, compressible, tender to palpation on the right side without generalized peritonitis. Labs remarkable for K 5.2, wbc 21.6, AST 72, alk phos 274, platelets 729. Abdominal ultrasound with  sludge, wall thickening up to 7mm, and pericholecystic fluid.Since arriving to the hospital, he's been started on stress steroids and broad antibiotics.REVIEW OF SYSTEMS: As per hpiPast medical historyPast Medical History[1]Past surgical historyPast Surgical History[2]Family historyFamily History[3]Social historySocial History[4]AllergiesAllergies[5]MedicationsMedications Prior to Admission Medication Sig  dexAMETHasone  (DECADRON ) 2 MG  tablet Take 1 tablet (2 mg total) by mouth 2 times daily (with meals) for cord compression.  acetaminophen  (TYLENOL ) 500 mg tablet Take 1 tablet (500 mg total) by mouth 3 times daily as needed for Pain for Pain. Take 2 tabs (1000mg ) by mouth three times a day as needed for pain. MDD 3000mg .  midodrine  (PROAMATINE ) 10 mg tablet Take 1 tablet (10 mg total) by mouth 3 times daily for Disorder of Low Blood Pressure.  pantoprazole  (PROTONIX ) 40 mg EC tablet Take 1 tablet (40 mg total) by mouth every morning. Swallow whole. Do not crush, break, or chew.  magnesium  oxide (MAGNESIUM  OXIDE -MG SUPPLEMENT) 400 (241.3 mg) mg tablet Take 1 tablet (400 mg total) by mouth daily.  aspirin  81 MG EC tablet Take 1 tablet (81 mg total) by mouth daily for DVT prophylaxis.  fluticasone  (FLONASE ) 50 MCG/ACT nasal spray Spray 1 spray into nostril daily for allergies.  Spray 1 spray each nares  atorvastatin  (LIPITOR) 20 mg tablet Take 1 tablet (20 mg total) by mouth daily (with dinner) for High Amount of Fats in the Blood.  fludrocortisone  (FLORINEF ) 0.1 mg tablet Take 1 tablet (100 mcg total) by mouth 3 times daily for Blood Pressure Drop Upon Standing.  b complex-vitamin c-folic acid  (NEPHRO-VITE) tablet Take 1 tablet by mouth daily for Complex vitamin.  calcium -vitamin D  (OSCAL-500) 500-200 MG-UNIT per tablet Take 1 tablet by mouth 2 times daily for Vit D.  cinacalcet  (SENSIPAR ) 30 MG tablet Take 2 tablets (60 mg total) by mouth every evening for parathyroid.  Commode, adjustable 3-in-1 for Cancer that has Spread From One Place to Another. Use as directed  walker Exact equipment needed: Rolling walker Specific length of time needed: LifetimeICD10 Code: R26.2 Ht: 5' 5   Wt: 175 lbs Objective VitalsTemp:  [35.6 C (96.1 F)-36.9 C (98.4 F)] 36.8 C (98.2 F)Heart Rate:  [91-112] 100Resp:  [16-19] 18BP: (71-105)/(0-67) 92/66Physical examGeneral: in nadHeart: tachycardicLungs:  somewhat labored on 2L NCAbd: distended, compressible, ttp on right side without  generalized peritonitisNeuro: alert, awakeExt: wwpData Reviewed:Labs reviewed:  and significant for K 5.2, wbc 21.6, AST 72, alk phos 274, platelets 729. I personally reviewed the US  images which show GB sludge, wall thickening, and pericholecystic fluid.Medical Decision Making Impression:Comorbid and critically ill 16M with metastatic RCC, malignant left pleural effusion, adrenal insufficiency admitted for right humerus pathologic fracture and course complicated by worsening HoTN with right-sided abdominal pain. Exam notable for afebrile, tachycardic, patient requiring pressors to maintain systolic >90s, labored respirations on 2L NC with abdomen that is distended, compressible, and ttp on right without diffuse peritonitis. Labs notable for leukocytosis, mild transaminitis and alk phos elevation, with US  demonstrating gallbladder sludge, thick GB wall, and surrounding pericholecystic fluid.Concerning for cholecystitis in comorbid patient. Hypotension could be multi-factorial in setting of metastatic disease and adrenal insufficiency. Poor surgical candidateRecommendations/Plan:Recommend IR consult for perc choleContinue broad abxResuscitation and rest of care per ICUEGS will follow - final recs pending attending attestation/co-signPreoperative Optimization: pendingDVT prophylaxis: If VTE prophylaxis is indicated, please do not hold it preoperatively.Rollo Marybeth Bevels, MD 11/20/2023 12:51 AMGeneral SurgeryAttending surgeon: Dr. Ronell Chestnut Care Surgery Attending Note:I evaluated the patient. I agree with the resident's/fellow's findings and plan of care as documented.At present, would anticipate management of gallbladder via percutaneous cholecystostomy, given current clinical condition.Problems addressed:Problem List  Diagnosis   Principal Problem: Other closed displaced fracture of proximal end of right humerus, initial encounter [S42.291A]  ESRD (end stage renal disease) on dialysis [N18.6, Z99.2] Assessment requiring an independent historian: noItems personally reviewed: Labs:   Labs reviewed as noted aboveImaging: CT scan: Abdomen: abnormal - dilated gallbladder extending to past liver and partially to RLQ.  Ultrasound: c/w dilated gallbladder and sludgeSigned by: MISSY RONELL, MD as of 11/20/2023 at 1:46 AM  [1] Past Medical History:Diagnosis Date  Arthritis   CHF (congestive heart failure)   Colon polyp   Dialysis patient   ESRD (end stage renal disease)   GERD (gastroesophageal reflux disease)   Hypercholesterolemia   Hypotension   Ischemic colitis   Thyroid  disease   hypo [2] Past Surgical History:Procedure Laterality Date  DIALYSIS FISTULA CREATION Bilateral   4 fistulas have failed- ? d/t low BP and small veins  HERNIA REPAIR    PR LIGATION/BIOPSY TEMPORAL ARTERY Right 10/04/2023  Procedure: TEMPORAL ARTERY BIOPSY RIGHT EYE;  Surgeon: Lynn Ervin Edelman, MD;  Location[facility] MAIN OR;  Service: Ophthalmology  Right inguinal hernia repair   [3] Family HistoryProblem Relation Name Age of Onset  Diabetes Mother    Asthma Mother    Coronary artery disease Father   [4] Social HistorySocioeconomic History  Marital status: Married Occupational History  Occupation: Airline pilot Tobacco Use  Smoking status: Former   Packs/day: 0.30   Years: 15.00   Additional pack years: 0.00   Total pack years: 4.50   Types: Cigarettes   Quit date: 08/02/1992   Years since quitting: 31.3  Smokeless tobacco: Never Substance and Sexual Activity  Alcohol use: No  Drug use: No [5] AllergiesAllergen Reactions  Penicillins Swelling and Rash   Facial swellingMed History Tech discussed Penicillin allergy with patient on 10/18/23.  Please see Antimicrobial Stewardship Chart Note for additional details.  Amoxicillin Swelling and Rash   Facial swellingMed History Tech discussed Amoxicillin allergy with patient on 10/18/23. Please see Antimicrobial Stewardship Chart Note for additional details.  Baclofen Other (See Comments)   confusion  Coconut Nausea And Vomiting  No Known Latex Allergy

## 2023-11-20 NOTE — Plan of Care (Signed)
 Interventional Radiology Preprocedure NoteReferring Provider: Lourdes Faden, MDGreg Meter is a 63 y.o. male with acute cholecystitis and Interventional Radiology has been consulted for percutaneous cholecystostomy tube placement.Relevant Imaging: Assessment/Plan: Acute cholecystitisIR will generate order for percutaneous cholecystostomy tube placemen.Plan to perform the procedure tentatively based on room availability. The bedside nurse will be contacted once scheduled.Plan for moderate sedationPreprocedural Requirements: Need to hold prophylactic DVT medications? Yes Are labs up to date (INR, plts, COVID)? Yes Does patient need to be NPO Yes, initiate now Contrast allergy? NoLink to PolicySTAT - Reaction Severity and Premedication Bleeding concerns requiring medication hold (anticoagulants, anti-platelets) and plan? Hold anticoagulants and antiplatlets Labs:  Lab results: 09/10/250205 WBC 21.6* Hemoglobin 10.1* Hematocrit 34* RBC 3.7* Platelets 729*    Lab results: 09/08/252324 INR 1.5* Recent Results (from the past 744 hours) COVID/Influenza A & B/RSV NAAT (PCR)  Collection Time: 11/13/23  8:04 PM Result Value Ref Range Status  COVID-19 Source  Nasopharyngeal  Final  COVID-19 NAAT (PCR) NEGATIVE Negative Final  Influenza A NAAT (PCR) NEGATIVE Negative Final  Influenza B NAAT (PCR) NEGATIVE Negative Final  RSV NAAT (PCR) NEGATIVE Negative Final Please page IR CONSULT Pager with questions.Signed by Lonni Shape, MD,PhD on 11/20/2023 at 2:35 AM

## 2023-11-20 NOTE — Progress Notes (Signed)
 Report Given ToDez RNActive Issues / Relevant Events Nursing Note MICU [id number] [id number]:Afebrile. NSR-ST. Remains on 2L- no desats. Levo titrated to maintain SBP >90- currently on 12. VBG sent. Perc drain placed overnight- no cultures sent, sample sent this AM. PT worked with patient, placed in cardiac chair mode- tolerating well. Small BM- bowel reg given. NG remains to LIWS- good output. Perc drain remains to gravity- good output. Wife updated bedside. MOLST filled out bedside with wife/patient- code status changed to DNR/DNI. Echo done bedside. 500c plasmalyte bolus with mild effect. See flowsheets/MAR for additional details. Larrisha Babineau, RN

## 2023-11-20 NOTE — Progress Notes (Addendum)
 11/20/23 1301 [facility] Patient Class Review Patient Class Review Inpatient Patient class effective as of 11/13/2023. Karna Dock, RNUtilization ManagementSecure Chat or [phone]

## 2023-11-20 NOTE — Progress Notes (Signed)
 Medical ICU Progress Note LOS: 7 Days Interval History/Subjective: - No acute events overnight. Patient states he is currently not in any pain. Denies CP, difficulty or change in his breathing. Brown fluid draining from perc chole tube. Last true BM was before arrival to hospital. Objective Vital Signs: BP 96/62 (BP Location: Left arm)   Pulse 92   Temp 36.8 C (98.2 F) (Axillary)   Resp 16   Ht 1.753 m (5' 9)   Wt 77.6 kg (171 lb)   SpO2 98%   BMI 25.25 kg/m  Physical Exam General: CAOx4. Not in acute distress.  Neuro:  PERRLA HEENT: MM dry   Pulm: Rales heard to left side anteriorly, otherwise clear and work of breathing is good  Cardiac: Normal rate and rhythm  GI: Abdominal distension. Generalized tenderness. No rebound or guarding. Perc chole tube present. Hypoactive bowel sounds.  GU: deferred  Extremities: Mild bilateral LE swelling Integument: Warm and dry. No rashes present.   Assessment: Shane Pearson is a 63 y.o. 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 Plan was for patient to have internal fixation surgery but was complicated with hypotension and presser support leading admission to ICU. Hypotension likely due to septic shock with gallbladder/bile source. Plan: Pulmonary: Hypoxic respiratory failure, likely mixed OSA/OHS vs volume overload - On 2 L NC, increased from O2 baseline- Recent VBG: ph 7.33, PCO2 45, PO2 43, bicarb 24 - Chest xray 9/9: No change from previous study. Pulmonary edema and small effusions. Left basilar opacity probably representing atelectasis. - CT chest 9/9 showing increase in size of large left pleural fluid with decrease in right pleural fluid. New ground glass opacities within right middle  and lower lobes. Pleural effusion likely malignant but cannot exclude infectious source. Consider thoracentesis due to increase in new O2 requirement from baseline. Drainage on 07/22 showed cloudy/yellow with malignant tumor cells present. LD 547, Pleural Protein 4.2. Total Protein 6.4. Exudate/Culture negative. No glucose obtained at that time. - Will consider diagnostic testing for thoracentesis in the coming days, but likely malignant in nature given likely septic cause from GB and pleural effusion being on opposite side. No intervention needed at this time. - MRSA nares obtained- Sputum cultures not obtained- ARDS? NoCardiovascular: Shock, likely septic, HFpEF, longstanding hypotension- Currently on Levophed , MAP goal 60 - Hydrocortisone  50 mg IV q6h - s/p 500 cc IVF, caution due to heart failure/ESRD - EKG showing sinus rhythm with prolonged Qct at 563 - Echo 2016: mild diastolic dysfunction - New echo pending - Pro BNP: 2,053. On admission was at 2700 - Lactate 1.9- Continue home Atorvastatin  20 mg - Continue home asa 81 mg daily - On midodrine  10 TID at home ID: No known infectious source, considerations of intra-abdominal due to GB distention, but reassuring RUQ panel, could be secondary to pneumonia    - WBC: 10.9   - Started on Vanc/Cefepime /Flagyl  11/19/23. No changes to antibiotics today. Likely can discontinue vancomycin  by tomorrow due to high likelihood of GB/bile as the primary infectious source. Continue following cultures. Plans to continue Cefepime /flagyl . - Micro/Culture data: Blood cultures NGTD.  Collected on 11/18/2023. Repeat blood cultures collected from HD cath. Remain NGTD.- Anaerobic/aerobic cultures off perc cholecystomy tube sent this morning, pending - No urine cultures obtained, anuric @ baseline - CT imaging w/ abd w/  severely dilated gallbladder, high density small volume perihepatic/ascitic fluid.- CT chest w/ shifting pleural  fluid, large left pleural effusion.- De-escalate antimicrobials?: NoRenal: ESRD T/Th/S HD. Anuric @ baseline.  - Last HD on 09/09- No HD needed today - Baseline sCr: 4.0-7.0  - Indwelling urinary catheter?: No GI: c/f ileus. Distended gallbladder although no clinical evidence of cholecystitis.   - NG tube placed, continue low/intermittent suction - NPO given ileus concern - Enteral Nutrition: No - GI prophylaxis: Yes; Protonix   - Last BM:  5 + days - Bowel Regimen: Senna/ miralax   - IR percutaneous cholecystostomy tube placed 9/10 - PCT should stay to gravity drainaige x2 weeks then should contact EGS service at that time for consideration of a cap trial.Heme: Chronic anemia  - Anticoagulation? Yes; Lovenox  30 mg SQ - No DVT's on baseline ultrasound - Schistocytes, spiculated, and target cells present on diff. 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  - On Florinef  for hypotension.- Initiated on Hydrocort  50 mg q6h - Hold dexamethasone  while on stress dose steroids - BG 60-130s during hospital course.- Cinacalcet  for hyperparathyroidism- Random cortisol 10.5 overnight Neuro/Psych/ Ortho: Pathologic R humeral fracture  - Ortho initially planning OR for IM fixation but was delayed due to transfer to MICU for hypotension. 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 - Continue immobilization and pain control with tylenol  and PRN oxy (5-10mg ) - Follow up with neuro oncology regarding leptomeningeal metastasis Social: - HCPBETHA Hayward Bronislaw Switzer, [phone]  - Last provider update: Spoke to wife at bedside. Updated on chole tube insertion. Filled out MOLST form.  - GOC addressed this ICU admission? Yes; DNR, non-invasive ventilation trial ok, if this fails DNI  - Wife at bedside updated.  Integument: Standard care - WOCN rec's up-to-date?: Yes Lines/Tubes/Drains: CVC tunneled left  femoral, cholecystomy tube, NG tubeFrequency of lab draws: Daily  Medication Reconciliation Completed: Yes Code Status: DNR/DNI Plan of care discussed with: Health care proxy and patient  Diagnoses for this hospitalization include: Sepsis Dispo: Pending clinical improvement Burnard Fey, PA Pulmonary/Critical Care MedicineI spent 75 cumulative minutes, independent of procedures, in attention to this critically ill patient who has 1 or more organ system failure and a high probability of imminent or life-threatening deterioration.   Fey Burnard, PA09/10/25 1504

## 2023-11-20 NOTE — Progress Notes (Signed)
 Report Given ToKate S, RN Active Issues / Relevant Events 83400 Nursing noteNeuro: received pt A&OX3 slighty confused at times,overnight became delirious, combative, pulling at lines and tubes, provider aware, x1 haldol  given w + effect,  afebrile. Cardiac:HR NSR- ST, on levo titrating for SBP > 90Pulm: received on  2L NC, no desats tolerating fineGI/GU: received on low sodium diet, made NPO d/t US  results, abdominal distention, firm, and no bowel movement over several days, NG placed to LIWS w + effect, anuric, x1 suppository given w no effect, mineral oil enema given x1 w no effect.Abd US  showed cholecystitis, IR consulted and perc drain placed in OR.Labs: type & screen drawn, WBC elevated

## 2023-11-20 NOTE — Interval H&P Note (Signed)
 UPDATES TO PATIENT'S CONDITION on the DAY OF SURGERY/PROCEDUREI. Updates to Patient's Condition (to be completed by a provider privileged to complete a H&P, following reassessment of the patient by the provider):Day of Surgery/Procedure Update:History(Inpatients only): I confirm that progress notes within the past 24 hours document updates to the patient's condition.Physical(Inpatients only): I confirm that progress notes within the past 24 hours document updates to the patient's condition.  II. Procedure Readiness I have reviewed the patient's H&P and updated condition. By completing and signing this form, I attest that this patient is ready for surgery/procedure.III. Attestation I have reviewed the updated information regarding the patient's condition and it is appropriate to proceed with the planned surgery/procedure.Fairy Sayre Suzi Hernan, DO as of 3:24 AM 11/20/2023

## 2023-11-20 NOTE — Procedures (Signed)
 Procedure Report Time out documentation completed prior to procedure:  YesIndications/Pre-Procedure diagnosis:  acute cholecystitis Procedure performed:  IR percutaneous cholecystostomy tube placementGuide Wire(s) removed and inspected for integrity:YesFindings/Procedure Summary (detailed report located in the Image tab): Percutaneous cholecystostomy tube placement, 8.24F drainComplications:  noneCondition:  unchangedEBL: minimalSpecimens:  N/AOperators: Dr. Fairy Romans and Dr. Lonni BartlowDisposition:  inpatientPost-Procedure Diagnosis: sameChristopher Richrd, MD,PhD  11/20/2023  3:47 AM

## 2023-11-20 NOTE — Progress Notes (Signed)
 [facility] Nursing Procedure Shane Pearson  [id number] Procedure (as described by patient): chole tube placement    Status:Completed. Patient tolerated procedure wellSpecimen collection:noSponge count:N/AFluid removed:N/AProcedure dressing site location:AbdomenDressing type:gauze and tegadermDressing status:Clean, dry and intact Hematoma:Not evidentTube labeled:yes tube #1 cholecystostomyMedication received:Lidocaine  10 ml and ceftriaxone  1gCardiovascular: Peripheral Pulses N/A Implant patient information given to patient or parent/guardian:N/ARadiation overexposure: NoReport given to:Unit Nurse. Unit 636i Name DesmondLast Filed Vitals  11/20/23 0340 BP:  Pulse: 102 Resp: 11 Temp:  SpO2: 96%

## 2023-11-20 NOTE — Progress Notes (Signed)
 Critical Care Attending Physician noteSYNOPSIS OF OUR ASSESSMENT AND PLANS:Septic shock - on levophed , stress dose steroidsRespiratory failure - hypoxemic; continue supplemental O2Malignant L effusion, no need to tap right nowCholecystitis - evaluated by EGS, IR placed a percutaneous cholecystostomy tubeCultures sent this am, pending resultsBlood cultures NG to dateCefipime, Flagyl , VancMetastatic RCC with malignant pleural effusionSpinal and intracranial metsAdrenal insufficiency - on stress dose steroids; fluricort, hydrocortESRD - HD TThSGI - NPO for now, ileusPathologic humeral fracture - pending fixationProphylaxis - on lovenoxIf there are key historical elements or objective findings that I think deserve particular emphasis, I have recorded them below.  A summary of key elements of our assessment and plans is listed above.  Please refer to APP's note for complete details of our mutually agreed upon findings, assessment, and recommendations.63 y.o. 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 He had undergone pre-emptive IR coil embolization in anticipation of pathologic humeral fracture reduction on 9/9, but developed hypotension and transferred to MICU.Objective findings include:Awake, oriented, responsiveHeart RRLungs equal air entry anteriorly, rales in LLL fieldAbd soft, tube in placeExt no edema_______________________________________________________________________________This patient was evaluated on rounds with the multidisciplinary ICU team.  I personally examined the patient.  All nursing documentation, laboratory data, test results, and radiographs were reviewed and interpreted by me.  I have established the  management plan for this patient's critical illness and have been immediately available to assist with patient care.  I agree with the database, findings, and plan of care recorded in the resident physician note.This patient is critically ill with at least 1 organ system failure associated with a high probability of imminent or life threatening deterioration.  The care I have delivered involves high complexity decision making to assess, manipulate, and support vital system function(s), to treat the vital organ system failure and/or prevent further life threatening deterioration of the patient's condition.  critical care time: I spent 45 minutes personally attending to this patient's critical care needs.  This critical care time is exclusive of any time for separately billable procedures.  My documented total critical care time reflects my own patient care time and does not include teaching or procedure time.

## 2023-11-20 NOTE — Plan of Care (Signed)
 Problem: SafetyGoal: Patient will remain free of fallsOutcome: Maintaining Problem: Pain/ComfortGoal: Patient's pain or discomfort is manageableOutcome: Maintaining No pain noted this shift. Problem: Cognitive functionGoal: Patient maintains appropriate nutritional intakeOutcome: MaintainingRemains NPO. Anabella Capshaw, RN

## 2023-11-20 NOTE — Progress Notes (Signed)
 Physical Therapy Treatment NoteTherapy Recommendations:Discharge Recommendations:  Rehab:     If the patient's support system identifies the ability to provide assistance, then a home plan could be considered with the equipment recommendations below. Recommendations: PT Discharge Equipment Recommended: (P) To be determined Additional justification: n/aPT Mobility Recommendations: (P) Stand pivot 2A with gait belt and hemiwalker vs hover to chairPT Referral Recommendations: (P) OT, SWPhysical therapy is following this patient.   11/20/23 0925 PT Tracking PT TRACKING PT Assigned Visit Number Visit Number [facility]) / Treatment Day (HH) 2 Visit Details [facility]) Visit Type [facility]) Follow Up-General Precautions/Observations Precautions used Yes Weight Bearing Status RUE NWB;LLE WBAT;RLE WBAT Brace Applied Yes Brace Type Sling Fall Precautions General falls precautions Activity Orders Present Yes LDA Observation Dialysis catheter;Central Line;Drain;Monitors;O2 (comment)(Levo 14) Drain Type PCT drain Vital Signs Response with Therapy VSS. BP stable in standing Isolation Precautions None Was patient wearing a mask? No Was Visitor Present No Was Visitor Masked No PPE worn by Clinical research associate [facility] Patient Subjective Pt recieved in bed. Sling off and gown pulled down. Pt asking for questions to be repeated multiple times Current Pain Assessment Pain Assessment / Reassessment Assessment Pain Scale 0-10 (Numeric Scale for Pain Intensity) 0-10  Pain Scale 0 No Intervention/MAR Intervention(s) No Intervention required Pain Comments Pt reporting no pain at rest. Communication Communication Style Verbal Cognition Arousal/Alertness Delayed responses to stimuli Orientation Oriented to person;Oriented to place;Disoriented to situation(Knew it was sept but not day.) Ability to Follow Instructions Follows simple commands with increased  time;Follows simple commands with repetition Additional Comments Pt delayed with commad following. He seemed confused with questions and hard time understanding. Bed Mobility Bed mobility Tested Supine to Sit 2 person assist;Moderate assist Sit to Supine 2 person assist;Moderate assist Additional comments ModA for supine to sitting EOB assisting in bringing LEs off the EOB and transitioning trunk to upright. Steady at EOB blocking LEs due to risk of sliding. Denied dizziness in sitting and BP stable. Transfers Transfers Tested Sit to Stand 2 person assist;Moderate Stand to sit 2 person assist;Moderate Immunologist Device (HHA on L) Additional comments ModA of 2 for sit to stands from EOB needing assist for blocking B LEs, assist at hand for stability and pressing into standing. R UE in sling. Difficulty shifting weight in standing adjusting feet. Pt performed 2 sit to stands before needing to return to supine due to fatigue and increased shortness of breath. BP stabl.e Mobility Mobility: Gait/Stairs Not tested (comment) Additional comments Unable to tolerate weight shifting and limited standing tolerance Therapeutic Positioning Additional comments Upright in chair position in bed. Balance Balance Tested Sitting - Static Contact guard Sitting - Dynamic Min assist Standing - Static Min assist;Supported Standing - Dynamic Mod assist;Supported Additional comments HHA on L side in standing Functional Outcome Measures Functional Outcome Measures Yes PT AM-PAC Mobility Turning over in bed? 2 Moving from lying on back to sitting on the side of the bed? 2 Moving to and from a bed to a chair? 1 Sitting down on and standing up from a chair with arms? 2 Need to walk in hospital room? 1 Climbing 3 - 5 steps with a railing? 1 Total Raw Score 9 AM-PAC T-Scale Score 25.8 Perme ICU Mobility Score Mental Status Alertness - On arrival 1 Mental Status  Alertness - Follow Commands 1 Potential Mobility Barriers - Is the patient on mechanical ventilation or non-invasive ventilation?  1 Potential Mobility Barriers - Pain 1 Potential Mobility Barriers - The patient has 2 or more of  the following devices 0 Devices Supplemental oxygen device;Central Line;Nasogastric tube(drain) Potential Mobility Barriers - Is the patient on any drips 0 Functional Strength - Right Leg 0 Functional Strength - Left Leg 0 Functional Strength - Right Arm 0 Functional Strength - Left Arm 0 Bed mobility - Supine to sit 2 Bed Mobility - Static sitting balance on side of bed once position is established 3 Transfers - Sit to stand 2 Transfers - Static standing balance once standing position is established 2 Transfer from bed to chair or chair to bed 0 Gait 0 Endurance 0 Total Perme Intensive Care Mobility Score: (max 32):  13 Assessment Brief Assessment Appropriate for skilled therapy Problem List Pain contributing to impairment;Impaired functional mobility Patient / Family Goal Ice chips Overall Assessment Pt functioning below baseline needing assist for all activity. Awaiting plan for R humerus fracture. WIll likely require rehab in order to return home unless he will be able to return home at [facility] level for stand pivot transfers. Will need to work with family to determine appropraite plan. No family present this session Plan/Recommendation PT Treatment Interventions Bed mobility training;Transfers training;Wheelchair training;Pt/Family education;Strengthening;Gait training;Family Training;Home exercise program instruction;D/C planning;Will work to minimize pain while promoting mobility whenever possible;Positioning PT Frequency 3-5 x/wk PT Positioning Recommendations OOBTC Daily PT Mobility Recommendations Stand pivot 2A with gait belt and hemiwalker vs hover to chair PT Referral Recommendations OT;SW PT Discharge Recommendations Rehab  PT Discharge Equipment Recommended To be determined Transportation Recommendations Stretcher PT Assessment/Recommendations Reviewed With: Nursing;Patient;Physician Next PT Visit Advance transfers and ambulation when able. Hemiwalker PT needs to see patient prior to DC  Yes Time Calculation Total Time Therapeutic Activities (minutes) 28 Total Time Gait Training (minutes) 0 Total Time Therapeutic Exercises (minutes) 0 Total Time Neuromuscular Re-education (minutes) 0 Total Time Caregiver Training (minutes) 0 Total Time Group Therapy (minutes) 0 PT Timed Codes 28 PT Untimed Codes 0 PT Unbilled Time 0 PT Total Treatment 28 Plan and Onset date Plan of Care Date 11/17/23 Onset Date 11/13/23 Treatment Start Date 11/17/23 Fairy Flemings PT, DPTPlease contact physical therapist on the treatment team via secure chat or via the secure chat group for your unit Physical Therapist with any questions.On weekends, please utilize the secure chat group, [facility] Physical Therapy 1st call, to contact PT.

## 2023-11-20 NOTE — Progress Notes (Addendum)
 Admission Date: 9/3/25Admission Note:Pt is a 63 y.o. male who presented with chief complaint of mechanical fall on 9/3 found to have a right pathologic humerus fracture s/p IR embolization. He has a PMHx of ESRD on HD, adrenal insufficiency, and metastatic renal cell carcinoma with mets to brain and bone. He has had intermittent hypotension during admission and was given IVF boluses. He developed worsening hypotension during HD with BP in 70/50s and was transferred to MICU and started on levophed .  enlarged gallbladder on imaging. EGS consulted for management of possible cholecystitis.  H&P: Past Medical History[1]Discharge Plan:from home with wifeFoley:  n/a         Insertion Date:    Criteria for continued ldz:Rzwumjo Line: CVC 11/18/23 HD cath 08/10/23 Insertion Date:DVT Prophylaxis:lovenoxPUD prophylaxis:protonixVaccine Status:covid vaccine x7Mobility: Code Status: FULL/has HCP, ESRDDaily Interdisciplinary Rounds/Plan of Care9/10/252LNCLeft pleural effus (likely malignant) Enlarged gall bladder-cholecystitis-bili drain placed. Cefepime  and flagylCheck with neuro-onc for treatment reqsLevo at 14 currently [1] Past Medical History:Diagnosis Date  Arthritis   CHF (congestive heart failure)   Colon polyp   Dialysis patient   ESRD (end stage renal disease)   GERD (gastroesophageal reflux disease)   Hypercholesterolemia   Hypotension   Ischemic colitis   Thyroid  disease   hypo

## 2023-11-20 NOTE — Progress Notes (Signed)
 Interventional Radiology Pre-Procedure Handoff/ChecklistNPO:  YesTube feed Stopped: NoAnticoagulants:lovenoxTelemetry: YesTransport mode: Bed  Number of Transporters needed: 2  Hover Mat:  YesIs patient changed into a hospital gown? YesReminded floor RN to have patient remove jewelry and leave valuables on the unit: YesIV access: Hemodialysis Catheter:  Tunneled Double lumen Left Internal Jugular (Active) Insertion Site Description Clean;Dry 11/20/23 0000 Phlebitis Scale Grade 0 11/20/23 0000 Infiltration Scale Grade 0 11/20/23 0000 Proximal Lumen Status Heparin  locked 11/20/23 0000 Distal Lumen Status Heparin  locked 11/20/23 0000 Dressing Type Transparent CHG 11/20/23 0000 Dressing Status Clean, dry, and intact 11/20/23 0000 Dressing Change New 11/14/23 1710 (T)Transparent Drsg Change- (Every 7 Days) Dressing changed 11/14/23 1710 Dressing Change Due 11/21/23 11/20/23 0000 Needleless Access Device(s) Changed? No 11/20/23 0000 Needleless Access Device(s) Change Due 11/21/23 11/20/23 0000 Heparin /TPA Dwell Yes - Heparin  11/20/23 0000 Freq of line accesses and lab draws addressed? Yes 11/20/23 0000 Extravasation No 11/20/23 0000 Comments dressing c/d/i no s/s of infection 11/14/23 1710   TCVC (Tunneled) 11/18/23 Left Femoral (Active) Insertion Site Description Dry;Clean 11/20/23 0000 Phlebitis Scale Grade 0 11/20/23 0000 Infiltration Scale Grade 0 11/20/23 0000 Proximal Lumen Status Infusing 11/20/23 0000 Medial 1 Lumen Status Blood return noted;Capped;Flushed;Normal Saline locked 11/20/23 0000 Distal Lumen Status Capped;Normal saline locked 11/20/23 0000 Dressing Type Transparent CHG 11/20/23 0000 Dressing Status Clean, dry, and intact 11/20/23 0000 Dressing Change Due 11/25/23 11/20/23 0000 Bag to hub changed? No 11/20/23 0000 Next bag to hub change due 11/22/23 11/20/23 0000 Needleless Access Device(s) Changed? No 11/20/23 0000  Needleless Access Device(s) Change Due 11/22/23 11/20/23 0000 **Need for continuing central line addressed? Medically indicated 11/20/23 0000 Extravasation No 11/20/23 0000 Respiratory: O2 Nasal Cannula 2 litersConsentable:yesAlert and Oriented to person, place and time?: yesCode Status: Full CodeDoes patient wear an insulin  pump? N/APrecautions:noneAllergies: Allergies[1]Shauntay Brunelli, RN Received Handoff Report from [place] for IR procedure 2:44 AM [1] AllergiesAllergen Reactions  Penicillins Swelling and Rash   Facial swellingMed History Tech discussed Penicillin allergy with patient on 10/18/23. Please see Antimicrobial Stewardship Chart Note for additional details.  Amoxicillin Swelling and Rash   Facial swellingMed History Tech discussed Amoxicillin allergy with patient on 10/18/23. Please see Antimicrobial Stewardship Chart Note for additional details.  Baclofen Other (See Comments)   confusion  Coconut Nausea And Vomiting  No Known Latex Allergy

## 2023-11-20 NOTE — Invasive Procedure Plan of Care (Signed)
 CONSENT FOR MEDICALOR SURGICAL PROCEDURE                            Patient Name: Shane Pearson 89 MR                                                              DOB: 09/18/1960     Please read this form or have someone read it to you. It's important to understand all parts of this form. If something isn't clear, ask us  to explain. When you sign it, that means you understand the form and give us  permission to do this surgery or procedure. I agree for Procell, Fairy Sayre, DO , and Vascular and Interventional Radiology Attendings, Fellows, Residents and Advanced Practice Providers. along with any assistants* they may choose, to treat the following condition(s): Gallbladder inflammation, bile leakage. By doing this surgery or procedure on me: Placement of a small, flexible, plastic tube through the skin into the gallbladder in order to drain an obstructed gallbladder. Tube changes as needed for 12 months. This is also known as: Cholecystostomy tube placement. Laterality: *if you'd like a list of the assistants, please ask. We can give that to you.1. The care provider has explained my condition to me. They have told me how the procedure can help me. They have told me about other ways of treating my condition. I understand the care provider cannot guarantee the result of the procedure. If I don't have this procedure, my other choices are: To not perform the procedure.2. The care provider has told me the risks (problems that can happen) of the procedure. I understand there may be unwanted results. The risks that are related to this procedure include: Allergic reaction to dye, pain at the site, bleeding, infection, sepsis, bile peritonitis, collapse of lung, bile leakage and collection in the abdomen, damage to nearby structures and organs, and in very rare circumstances death.3. I understand that during the procedure, my care provider may find a condition that we didn't  know about before the treatment started. Therefore, I agree that my care provider can perform any other treatment which they think is necessary and available.4. I give permission to the hospital and/or its departments to examine and keep tissue, blood, body parts, fluids or materials removed from my body during the procedure(s) to aid in diagnosis and treatment, after which they may be used for scientific research or teaching by appropriate persons. If these materials are used for science or teaching, my identity will be protected. I will no longer own or have any rights to these materials regardless of how they may be used.5. My care provider might want a representative from a medical device company to be there during my procedure. I understand that person works for:        The ways they might help my care provider during my procedure include:      6. Here are my decisions about receiving blood, blood products, or tissues. I understand my decisions cover the time before, during and after my procedure, my treatment, and my time in the hospital. After my procedure, if my condition changes a lot, my care provider will talk with me again about receiving blood or blood products. At that  time, my care provider might need me to review and sign another consent form, about getting or refusing blood.I understand that the blood is from the community blood supply. Volunteers donated the blood, the volunteers were screened for health problems. The blood was examined with very sensitive and accurate tests to look for hepatitis, HIV/AIDS, and other diseases. Before I receive blood, it is tested again to make sure it is the correct type.My chances of getting a sickness from blood products are small. But no transfusion is 100% safe. I understand that my care provider feels the good I will receive from the blood is greater than the chances of something going wrong. My care provider has answered my questions  about blood products.Moderate Sedation:The moderate sedation procedure has been explained to me as outlined below and my questions/concerns regarding the sedation have been answered/addressed to my satisfaction.I understand that alternatives to receiving moderate sedation for this procedure include not undergoing the procedure, or undergoing the procedure without sedation. If neither of these options is acceptable, there may be other clinical options that I can discuss with my healthcare provider. I understand that moderate sedation is a state of altered consciousness that is usually created by the administration of intravenous medications (medications I.V.). Patients under moderate sedation often feel sleepy and very relaxed, but will respond to commands. The purpose of moderate sedation is to make me comfortable during my procedure. This can also make the procedure easier to perform. I understand that while many patients undergoing moderate sedation will have little to no memory of the sedation or the procedure, awareness during the procedure is possible. Again, the goal is to make me comfortable. I understand that I will be carefully monitored throughout my sedation and procedure to keep me safe. I will not be discharged until the effects of my sedation have worn off sufficiently for me to stay safe. I must not drive, operate dangerous machinery or make important decisions until the next day following my sedation, and I must be accompanied by a responsible adult when I go home.I understand that no medical treatment/procedure is without risk, and this includes moderate sedation, although risks are rare. These include excessive sedation which may require special procedures to keep me safe. I understand some patients may experience nausea and vomiting after sedation. Rarely, patients develop unstable heart rhythms or breathing difficulties, which can lead to death in very rare circumstances. By  signing this document I acknowledge that I have read this consent and had the opportunity to discuss this with my healthcare provider. I also agree to allow my provider to perform any procedure he/she deems necessary to keep me safe during my sedation. My decisionabout blood orblood products   My decision about tissueImplants  I understand thisform.My care provideror his/herassistants haveexplained: What I am having done and why I need it.What other choices I can make instead of having this done.The benefits and possible risks (problems) to me of having this done.The benefits and possible risks (problems) to me of receiving transplants, blood, or blood products.There is no guarantee of the results.The care provider may not stay with me the entire time that I am in the operating or procedure room.My provider has explained how this may affect my procedure. My provider has answered myquestions about this. I give mypermission forthis surgery orprocedure.        _________________________________________  My signature(or parent or other person authorized to sign for you, if you are unable to sign for yourself or if you are under 69 years old)       _____________   Date    (MM/DD/YYYY)     _______  Time  Electronic Signatures will display at the bottom of the consent form.Care provider's statement: I have discussed the planned procedure, including the possibility for transfusion of bloodproducts or receipt of tissue as necessary; expected benefits; the possible complications and risks; and possible alternativesand their benefits and risks with the patients or the patient's surrogate. In my opinion, the patient or the patient's surrogateunderstands the proposed procedure, its risks, benefits and alternatives.      Electronically signed by: Lonni Shape, MD,PhD                                           11/20/2023       Date    2:59 AM      Time

## 2023-11-20 NOTE — Plan of Care (Signed)
 General Surgery Plan of CarePer Coley tube placed yesterday.  Remains on Levophed .  Per Coley tube should remain to gravity drainage for 2 weeks.  Please contact EGS service at that time for consideration of a cap trial.Damonica Chopra R. Edrei Norgaard, MDPGY-5 General Surgery10:11 AM

## 2023-11-20 NOTE — Comprehensive Assessment (Signed)
 Adult Social Work Initial AssessmentGregory Daejon Pearson is a 63 yr old male who was admitted on 11/13/23 from home. Writer met with patient and contacted patients wife/HCP Shane to introduce self, explain role, complete psychosocial assessment and address any questions or concerns they may have. Patient states he, Shane and patients mother live together in a ranch home with 2 STE and a stair lift going to the basement where patients office and entertainment area are. Shane states patient is independent with most ADL's using a rolling walker when ambulating however does get assistance from her with bathing, dressing, meal prep, transportation and household maintenance and chores. Patient confirms he goes to HD [facility] at [facility] [place] am which Shane provides transportation to/from. 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 Patient and Shane confirmed all demographic information including PCP Dr. Debby Pearson and uses [facility] on [address] for pharmacy needs. Patient confirmed HCP on file appointing Shane as agent is accurate and most up to date. Shane states patient also has a rollator, wheelchair, shower chair, grab bars in the shower and by the toilet and an adjustable bed. Shane states she understands they will be having more conversations with MICU team regarding overall GOC however states patient wouldn't want to go to rehab and she would support this. Shane states she is prepared to take patient home even if he required a hoyer for transfers. Writer updated MICU team of the above information. SW will continue to provide support and assist in discharge planning as needed. Demographics:DemographicsCounty of Residence: [place]Marital status: MarriedEthnicity/Race: CaucasianPrimary Language: Art therapist of another person?: No oneDo you have animals or pets at  home?: NoRisk Factors:Risk FactorsRisk Factors: Age related issues, Adjustment to Dx/Injury/IllnessAdvance Directive:Health Care Directives Screen (Also required for Hospice Patients)*Has patient (or family) completed any of the following? (select all that apply): Health Care Proxy (HCP)HCP Name: Shane Pearson Phone Number: [phone] available for inclusion in the chart?: YesHCP in chart: Yes*Would they like to discuss any issues related to MOLST, DNR Order, HCP, Living Will, or POA?: No*Health Care Directive teaching done: NoPersonal Contacts/Support System:Contact InformationPIN: [id number] Name: Shane LytleRelationship to Patient : SpousePhone Number(s): [phone] Spokesperson been notified of admission?: YesAlternate Spokesperson?: NoEmergency Contact other than spokesperson?: NoIs spokesperson the patient's caregiver after discharge?: Yes  Professional Contacts/Support System: PCP=Primary Care PhysicianPCP=Primary Care Physician Name: Dr. Debby Pearson PCP=Primary Care Physician Number: [phone]  Military History: NoneLiving Situation:Living SituationLiving Situation: HomeLives With:  (mom & wife Shane Pearson)Can they assist patient after discharge?: [employer] Taker of another person?: No oneHome Geography:Home GeographyType of Home: Ranch Home# Of Steps In Home: 0# Steps to Enter Home: 2Bedroom: First floorBathroom: First floor - fullUtilitites Working: YesPsychosocial:Palliative Care AssessmentPerson assessed: PatientCoping Status: Appropriate to StressorCurrent Goal of Care: CurativeAlcohol Assessment:Alcohol Assessment (SBIRT)> 0 ETOH drinks in past month: NoSubstance Abuse (Not including alcohol):SW Substance Abuse (not incl alcohol)Other Substance Abuse: NoOther Substance Abuse: No Baseline ADL functioning:Baseline ADL  functioningTransfers: IndependentAmbulation: IndependentAssistive Device: WalkerBathing/Grooming: With assistanceMeal Prep: With assistanceAble to feed self?: YesHousehold maintenance/chores: With assistanceAble to drive?: NoDialysis:DialysisDoes Patient have Dialysis?: YesDialysis Status[facility]When: TThSaTime: 0725Transport: wifeSlot Saved Until: 10/03/25Income Information:Income InformationVocational: Full time employmentIncome Situation: Patient Employment (wages)Prescription Coverage: and hasPh[facility] [place]Home Care Services:Home Care ServicesDo you currently have home care services?: [facility] services delivered: PTCurrent Home Equipment: Adjustable bed, Grab bars in  shower/tub, Grab bars around toilet, Shower seat (built in), [employer] (rolling), Environmental consultant (rollator), Wheelchair (manual), [employer] (straight) (Spouse also reports they are going to install a ramp) Home Oxygen:SW Home oxygenDo you have home oxygen?: No What Matters:What is/will be most important to you during your hospital stay?: be comfortableWhat matters most to you when you leave the hospital?: n/aCaitlin Zuria Pearson, LMSWPhone: [phone]

## 2023-11-21 ENCOUNTER — Inpatient Hospital Stay

## 2023-11-21 DIAGNOSIS — M79632 Pain in left forearm: Secondary | ICD-10-CM

## 2023-11-21 DIAGNOSIS — C799 Secondary malignant neoplasm of unspecified site: Secondary | ICD-10-CM

## 2023-11-21 DIAGNOSIS — M79622 Pain in left upper arm: Secondary | ICD-10-CM

## 2023-11-21 LAB — CBC AND DIFFERENTIAL
Baso # K/uL: 0 THOU/uL (ref 0.0–0.2)
Eos # K/uL: 0 THOU/uL (ref 0.0–0.5)
Hematocrit: 31 % — ABNORMAL LOW (ref 37–52)
Hemoglobin: 9.4 g/dL — ABNORMAL LOW (ref 12.0–17.0)
Lymph # K/uL: 1.7 THOU/uL (ref 1.0–5.0)
MCV: 90 fL (ref 75–100)
Mono # K/uL: 1.3 THOU/uL — ABNORMAL HIGH (ref 0.1–1.0)
Neut # K/uL: 18.5 THOU/uL — ABNORMAL HIGH (ref 1.5–6.5)
Nucl RBC # K/uL: 1.8 THOU/uL — ABNORMAL HIGH (ref 0.0–0.1)
Nucl RBC %: 8.3 /100{WBCs} — ABNORMAL HIGH (ref 0.0–0.2)
Platelets: 700 THOU/uL — ABNORMAL HIGH (ref 150–450)
RBC: 3.5 MIL/uL — ABNORMAL LOW (ref 4.0–6.0)
RDW: 23.6 % — ABNORMAL HIGH (ref 0.0–15.0)
Seg Neut %: 81.6 %
WBC: 21.7 THOU/uL — ABNORMAL HIGH (ref 3.5–11.0)

## 2023-11-21 LAB — BASIC METABOLIC PANEL
Anion Gap: 22 — ABNORMAL HIGH (ref 7–16)
CO2: 17 mmol/L — ABNORMAL LOW (ref 20–28)
Calcium: 8.4 mg/dL — ABNORMAL LOW (ref 8.6–10.2)
Chloride: 95 mmol/L — ABNORMAL LOW (ref 96–108)
Creatinine: 6.06 mg/dL — ABNORMAL HIGH (ref 0.67–1.17)
Glucose: 92 mg/dL (ref 60–99)
Lab: 57 mg/dL — ABNORMAL HIGH (ref 6–20)
Potassium: 5.3 mmol/L — ABNORMAL HIGH (ref 3.3–5.1)
Sodium: 134 mmol/L (ref 133–145)
eGFR BY CREAT: 10 — AB

## 2023-11-21 LAB — VENOUS GASES / WHOLE BLOOD PANEL
Base Excess,VENOUS: -7 mmol/L — ABNORMAL LOW (ref <=2)
Bicarbonate,VENOUS: 18 mmol/L — ABNORMAL LOW (ref 21–28)
CO2 (Calc),VENOUS: 19 mmol/L — ABNORMAL LOW (ref 22–31)
CO: 1.2 %
Chloride,WB: 98 mmol/L (ref 96–108)
FO2 HB,VENOUS: 67 % (ref 63–83)
Glucose,WB: 90 mg/dL (ref 60–99)
Hemoglobin: 10.3 g/dL — ABNORMAL LOW (ref 12.0–17.0)
ICA @7.4,WB: 4.3 mg/dL — ABNORMAL LOW (ref 4.8–5.2)
ICA Uncorr,WB: 4.5 mg/dL — ABNORMAL LOW (ref 4.8–5.2)
Lactate VEN,WB: 2.3 mmol/L — ABNORMAL HIGH (ref 0.5–2.2)
Methemoglobin: 2.3 % — ABNORMAL HIGH (ref 0.0–1.0)
NA, WB: 130 mmol/L — ABNORMAL LOW (ref 135–145)
PCO2,VENOUS: 34 mmHg — ABNORMAL LOW (ref 40–50)
PH,VENOUS: 7.33 (ref 7.32–7.42)
PO2,VENOUS: 43 mmHg (ref 30–43)
Potassium,WB: 5.6 mmol/L — ABNORMAL HIGH (ref 3.3–4.6)

## 2023-11-21 LAB — MAGNESIUM: Magnesium: 2.1 mg/dL (ref 1.6–2.5)

## 2023-11-21 LAB — DIFF MANUAL
Bands %: 3 % (ref 0–10)
Diff Based On: 119 {cells}
Metamyelocyte %: 1 % (ref 0–1)
Myelocyte %: 1 % — ABNORMAL HIGH
Promyelocyte %: 1 % — ABNORMAL HIGH

## 2023-11-21 LAB — PHOSPHORUS: Phosphorus: 5.9 mg/dL — ABNORMAL HIGH (ref 2.7–4.5)

## 2023-11-21 LAB — VANCOMYCIN, RANDOM: Vancomycin Level: 14.2 ug/mL

## 2023-11-21 MED ORDER — VANCOMYCIN HCL IN NACL 500 MG/100 ML IV SOLN *WRAPPED*
500.0000 mg | Freq: Once | INTRAVENOUS | Status: AC
Start: 2023-11-21 — End: 2023-11-21
  Administered 2023-11-21: 500 mg via INTRAVENOUS
  Filled 2023-11-21: qty 100

## 2023-11-21 NOTE — Progress Notes (Signed)
 Report Given ToKate S. RNActive Issues / Relevant Events Nursing Note MICU 63600I [id number]:Afebrile. A&O to self & place intermittently, NSR-ST. Removed 2L w + effect- no desats. Levo titrated to maintain SBP >90- currently on 14mcg. NG remains to LIWS- good output. Perc drain remains to gravity- good output. 500c plasmalyte bolus for hypotension w minimal effect. X1 BM, Denies pain, no PRNs given See flowsheets/MAR for additional details.

## 2023-11-21 NOTE — Progress Notes (Signed)
 Medical ICU Progress Note LOS: 8 Days Interval History/Subjective: - Saw patient at bedside. He states he is in no pain. Denies any increase in work of breathing. He states he is comfortable. - Still remaining on 12 of levo. Continuing on abx. Was scheduled for HD today, but per nephro not indicated.- Repeat exam 2:20 pm: patient is tired on exam, slow to respond, states his left arm is in pain. Plans to check vbg and left arm xrays. Objective Vital Signs: BP 117/88 (BP Location: Left arm) Comment: levo decreased to 14mcg  Pulse 94   Temp 36.9 C (98.4 F) (Axillary)   Resp 20   Ht 1.753 m (5' 9)   Wt 77.6 kg (171 lb)   SpO2 93%   BMI 25.25 kg/m  Physical ExamPhysical Exam General: No acute distress. CAOx4, but slow to respond Neuro: equal grip strength, pupils equal and reactive  HEENT: mucous membranes dry  Pulm: CTAB anteriorly  Cardiac: Normal rate and rhythm  GI: hypoactive bowel sounds GU: deferred Extremities: No lower extremity edema bilaterally  Integument: warm, dry   Assessment: Shane Pearson is a 63 y.o. 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 Plan was for patient to have internal fixation surgery but was complicated with hypotension and presser support leading admission to ICU. Hypotension likely due to septic shock with gallbladder/bile source. Plan: Pulmonary: Hypoxic respiratory failure, likely mixed OSA/OHS vs volume overload- On 2 L NC, increased from O2 baseline. Goal SPO2 >92% - Recent VBG: ph  , PCO2 , PO2 , bicarb  - Chest xray 9/9: No change from previous study. Pulmonary edema and small effusions. Left basilar opacity probably representing atelectasis. - CT chest 9/9 showing increase in size of large  left pleural fluid with decrease in right pleural fluid. New ground glass opacities within right middle and lower lobes. Pleural effusion likely malignant but cannot exclude infectious source. Consider thoracentesis due to increase in new O2 requirement from baseline. Drainage on 07/22 showed cloudy/yellow with malignant tumor cells present. LD 547, Pleural Protein 4.2. Total Protein 6.4. Exudate/Culture negative. No glucose obtained at that time. - Will consider diagnostic testing for thoracentesis in the coming days, but likely malignant in nature given likely septic cause from GB and pleural effusion being on opposite side. No intervention needed at this time.  - MRSA nares obtained, negative - Sputum cultures not obtained- ARDS? NoCardiovascular:  Shock, likely septic, HFpEF, longstanding hypotension  - Currently on Levophed , MAP goal 60- Hydrocortisone  50 mg IV q6h - s/p 1.5 L IVF, caution due to heart failure/ESRD. Pressures with no response post fluids. - EKG showing sinus rhythm with prolonged Qct at 563 - Echo 2016: mild diastolic dysfunction - New echo done 9/10 was limited/difficult study but showed hyperdynamic LVEF without significant wall motion abnormalities. LVEF 75-80%- Pro BNP: 2,053. On admission was at 2700 - Lactate 1.9 - Continue home Atorvastatin  20 mg - Continue home asa 81 mg daily - On midodrine  10 TID at home ID: No known infectious source, considerations of intra-abdominal due to GB distention, but reassuring RUQ panel, could be secondary to pneumonia    - Continue Cefepime /Flagyl /vanc. Will continue with this regimen. Continue following cultures. - Micro/Culture data: Blood cultures NGTD.  Collected on 11/18/2023. Repeat blood cultures collected from HD cath. Remain NGTD.- Aerobic cultures off  perc cholecystomy tube growing gram + cocci in pairs and chains, representative of likely streptococcus or enterococcus. Anaerobic cultures with NGTD.  - No urine cultures obtained, anuric @ baseline - CT imaging w/ abd w/ severely dilated gallbladder, high density small volume perihepatic/ascitic fluid.- CT chest w/ shifting pleural fluid, large left pleural effusion.- De-escalate antimicrobials?: NoRenal: ESRD T/Th/S HD. Anuric @ baseline.  - Last HD on 09/09. Was scheduled for HD today but based on nephrology assessment it is not indicated at this time. Suspect patient will need CRRT in the near future. HCP in agreement with this plan. - Baseline sCr: 4.0-7.0  - Indwelling urinary catheter?: No GI:  c/f ileus. Distended gallbladder although no clinical evidence of cholecystitis.    - Clamp NG tube and swallow study today- Enteral Nutrition: No - GI prophylaxis: Yes; Protonix   - Last BM: this AM - Bowel Regimen: Senna/ miralax   - IR percutaneous cholecystostomy tube placed 9/10 - PCT should stay to gravity drainaige x2 weeks then should contact EGS service at that time for consideration of a cap trial.Heme: Chronic anemia   - Anticoagulation? Yes; Lovenox  30 mg SQ - No DVT's on baseline ultrasound - Schistocytes, spiculated, and target cells present on diff. 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    -On Florinef  for hypotension.- Initiated on Hydrocort  50 mg q6h - Hold dexamethasone  while on stress dose steroids - BG 60-130s during hospital course.- Cinacalcet  for hyperparathyroidism- Random cortisol 10.5 overnight  Neuro/Psych/ortho:  Pathologic R humeral fracture   - Ortho initially planning OR for IM fixation but was delayed due to transfer to MICU for hypotension. 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 - Continue immobilization and pain control with tylenol  and PRN oxy (5-10mg ) - Follow up with neuro oncology regarding leptomeningeal metastasis - Patient has new complaints of generalized left arm pain, ordered left humerus/forearm  xray to rule out pathologic fx. Social: - HCPTank, [place], [phone]  - Last provider update: Updated MOSLT form yesterday to DNR/DNI. Spoke to wife at bedside today. Updated on patient's course. She is aware he did not receive dialysis today, and is in agreement with CRRT if needed in the future. - GOC addressed this ICU admission? Yes; DNR, non-invasive ventilation trial ok (bipap), if this fails DNI  Integument: Standard care - WOCN rec's up-to-date?: Yes Lines/Tubes/Drains: CVC tunneled left femoral, cholecystomy tube, NG tubeFrequency of lab draws: Daily   Medication Reconciliation Completed: Yes Code Status: DNR/DNI Plan of care discussed with: Health care proxy and patient  Diagnoses for this hospitalization include: Sepsis Dispo: Pending clinical improvement Burnard Fey, PA Pulmonary/Critical Care MedicineI spent 45 cumulative minutes, independent of procedures, in attention to this critically ill patient who has 1 or more organ system failure and a high probability of imminent or life-threatening deterioration.   Fey Burnard, PA09/11/25 1438

## 2023-11-21 NOTE — Progress Notes (Signed)
 Vancomycin  Hemodialysis- Pharmacist to DosePatient is on hemodialysis Tuesday;Thursday;Saturday and on Vancomycin  for Intra-Abdominal Infection.The last dose of vancomycin  1500 mg IV was given on 1354 post-dialysis. Vancomycin  can be re-dosed when random vancomycin  concentration is less than 20 mcg/mL. Random vancomycin  concentration is 14.2 mcg/mLAssessment and PlanBased on vancomycin  random concentration of 14.2 mcg/mL, vancomycin  concentration is within target range and a vancomycin  dose will be ordered.Will re-dose with vancomycin  500 mg, IV x 1 at 1500.Next vancomycin  concentration to be drawn will be clinically determined.Pharmacist will follow clinical course and will order vancomycin  concentrations as indicated.For questions contact pharmacy at extension [phone].Shane Pearson C Eliab Closson, PharmD

## 2023-11-21 NOTE — Progress Notes (Signed)
 Nephrology Attending Pt seen and examinedSeptic on pressors and cefepime /vanco/flagyliEdematous  but alertResp not laboredObjective  Current Vitals Vitals Range (24 Hours) BP 98/56 (BP Location: Left arm) Comment: levo to 10  Pulse 96   Temp 37.1 C (98.7 F) (Axillary)   Resp 22   Ht 1.753 m (5' 9)   Wt 77.6 kg (171 lb)   SpO2 94%   BMI 25.25 kg/m  BP: (83-117)/(48-88) Temp:  [36.7 C (98.1 F)-37.1 C (98.8 F)] Temp src: Axillary (09/11 0800)Heart Rate:  [77-104] Resp:  [16-22] SpO2:  [92 %-100 %] Height:  [175.3 cm (5' 9)] Weight:  [77.6 kg (171 lb)]  I/Os Weight I/O last 3 completed shifts:09/10 0700 - 09/11 0659In: 2180.7 (28.1 mL/kg) [I.V.:641.9 (0.3 mL/kg/hr); NG/GT:180; IV Piggyback:1358.9]Out: 1465 (18.9 mL/kg) [Emesis/NG output:1465]Net: 715.7Weight: 77.6 kg  Last 4 Weights  11/20/23 1100 Weight: 77.6 kg (171 lb)  Physical Exam:Constitutional:General: Not in acute distress, laying in bedAppearance: ill-appearingHENT:Eyes: Anicteric scleraeHead: Normocephalic and atraumaticCVS: Rate and Rhythm: Normal rate and rhythmEdema:  peripheral edema bilaterallyPulmonary: Effort: No respiratory distressBreath sounds: CTAB anteriorly GI: distendedAccess: HDTC, dressing CDIRecent Labs Lab 09/10/252322 09/09/252352 09/09/251237 09/08/252324 Sodium 134 133 136 131* Potassium 5.3* 5.2* 4.9 5.2* Chloride 95* 94* 94* 91* CO2 17* 23 19* 20 Anion Gap 22* 16 23* 20* UN 57* 41* 35* 55* Creatinine 6.06* 5.21* 4.72* 6.80* Glucose 92 84 68 97 Calcium  8.4* 8.7 8.1* 8.4* Albumin  --   --  3.0* 3.1* Phosphorus 5.9* 5.2* 4.5 5.9* Magnesium  2.1 1.9 1.8 1.9 No results found for: BKVQLNo results found for: CMVQLAerobic bacterial pleural or thoracentesis fluid culture and stain Date Value Ref Range Status 10/01/2023 Lab Cancel  Final Aerobic bacterial wound or drainage culture and  stain Date Value Ref Range Status 11/20/2023 .  Preliminary Anaerobic bacterial wound or drainage culture Date Value Ref Range Status 11/20/2023 .  Preliminary Bacterial Blood Culture Date Value Ref Range Status 11/19/2023 .  Preliminary 11/19/2023 .  Preliminary  Recent Labs Lab 09/10/252322 09/10/250205 09/09/251237 WBC 21.7* 21.6* 14.5* Hemoglobin 9.4* 10.1* 9.5* Hematocrit 31* 34* 32* Platelets 700* 729* 639*   Component Value Date/Time  FE 65 10/17/2023 1644  IBC 176 (L) 10/17/2023 1644  FESAT 37 10/17/2023 1644  FER 881 (H) 10/17/2023 1644 No results for input(s): TACRR, SIR, CSAM in the last 168 hours. No results for input(s): JOLINDA PLEVA, [place], [place], [place], [place], UOSMO in the last 168 hours.No results for input(s): UAPP, UCOL, UAGLU, KETONESU, USG, UBLD, UAPH, UPRO, UNITR, ULEU, URBC, UWBC, UMUC, [place], [place], SPECIFICGRAV, BLOODUA, PHUA, PROTEIN, NITRITEUA, LEUKESTERASE, GLUCOSESIE, KETONESIEM, PUSG, BLOODSIEMEN, PUAPH, PROTEINUA, PUNIT, LEUKOCYTESIE in the last 8760 hours. IR percutaneous cholecystostomy drainage and catheter placementResult Date: 9/10/2025Insertion of cholecystostomy tube with drainage of dark bile. Plan: Routine exchange in approximately 6 weeks. END OF IMPRESSION I was present for the critical and key portions of the procedure or visit and was immediately available in the suite to assist. I have personally reviewed the images and the Resident's/Fellow's interpretation and agree with or edited the findings. EKG 12 leadResult Date: 9/10/2025Sinus rhythm Low voltage, extremity and precordial leadsCT chest without contrastResult Date: 9/10/2025Shifting pleural fluid with interval increase in size of now large left pleural fluid with associated large volume atelectasis of the left lower lobe. Interval decrease in  right pleural fluid with small residual loculated fluid. New groundglass opacities within the right middle and lower lobes concerning for infectious/inflammatory process. There are a few new nodules within the right middle and lower lobes as described  above and may be concerning for sequela of pleural nodularity. Recommend follow-up CT chest in 3 months. Multiple soft tissue nodules within the anterior and posterior chest wall as described above. These are not significantly changed compared to prior exam. Recommend attention on future follow-up. Stable mediastinal and hilar lymphadenopathy concerning for possible metastatic disease. Consider further evaluation with PET/CT and/or tissue sampling. Please see separately dictated CT abdomen and pelvis imaging report for detailed findings below the level of the diaphragm. END OF IMPRESSION I have personally reviewed the images and the Resident's/Fellow's interpretation and agree with or edited the findings. Portable US  doppler vein bilateral lower extremitiesResult Date: 9/10/2025No evidence of acute deep venous thrombosis in the visualized vessels. The left common femoral vein and lower portions of the peroneal veins are not visualized. Chronic changes along the wall of the left popliteal vein. END OF IMPRESSION Portable US  abdomen limited single quadResult Date: 9/10/2025Distended gallbladder with sludge, gallbladder wall thickening and pericholecystic fluid. This is suspicious for evolving cholecystitis. Increased renal cortical echogenicity, as can be seen in the setting of medical renal disease. Multiple renal cysts. END OF IMPRESSION. CT abdomen and pelvis without contrastResult Date: 9/9/2025Severely dilated gallbladder. Correlation with ultrasound could be performed for further assessment if there is concern for cholecystitis. High density small volume perihepatic and pelvic fluid. This may represent blood products or proteinaceous ascites  from other etiology. Correlation for clinical signs of bleeding is recommended. Borderline dilated right colon, which may represent an ileus. Small volume intermediate free pelvic fluid, which may represent hemoperitoneum. Multifocal nodal, soft tissue and intramuscular metastases. Diffuse osseous metastatic disease. Polycystic kidney disease. Large left pleural effusion which is high density, see separately dictated chest CT for additional details. END OF IMPRESSION *Chest STANDARD single viewResult Date: 9/9/2025No change from previous study. Pulmonary edema and small effusions. Left basilar opacity probably representing atelectasis. END OF IMPRESSION IR body embolizationResult Date: 9/9/2025Preoperative embolization of branches off of the right posterior circumflex humeral artery and deep brachial artery using a combination of coil embolization and bland bead embolization with significantly reduced right humeral tumoral blush on completion angiography. Plan: Bedrest with right leg straight for 2 hours post procedure END OF IMPRESSION I was present for the critical and key portions of the procedure or visit and was immediately available in the suite to assist. I have personally reviewed the images and the Resident's/Fellow's interpretation and agree with or edited the findings.  Current MedicationsScheduled Meds: ceFEPime  (MAXIPIME ) IV  1,000 mg Intravenous Q24H  metroNIDAZOLE   500 mg Intravenous Q8H  hydrocortisone  IV  50 mg Intravenous Q6H  enoxaparin   30 mg Subcutaneous Daily @ 2100  polyethylene glycol  17 g Oral 2 times per day  senna  2 tablet Oral BID  atorvastatin   20 mg Oral Daily with dinner  B complex-vitamin C-folic acid   1 tablet Oral Daily  cinacalcet   60 mg Oral QPM  fludrocortisone   100 mcg Oral TID  fluticasone   1 spray Nasal Daily  magnesium  oxide  400 mg Oral Daily  midodrine   10 mg Oral TID  pantoprazole   40 mg Oral QAM  acetaminophen   1,000  mg Oral TID Continuous Infusions: Vancomycin  - Pharmacist to Dose    NORepinephrine  12 mcg/min (11/21/23 0959) PRN Meds:perflutren  protein A microsph, oxyCODONE  **OR** oxyCODONE , DiphenhydrAMINE -Lidocaine -Maalox (BMX/First Mouthwash), melatonin, heparin  (porcine), heparin  (porcine), sodium chloride , dextrose , naloxone   AssessmentGreg Pearson is a 63 y.o. 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 Ortho being consulted. Nephrology consulted for ESRD and provision of dialysis. PlanESRD on HD:-Labs reviewed, mild hyperkalemia noted. Temporize and follow K level.-No acute indication for HD today.-Pt is on 14 mcg of norepi currently and unlikely to tolerate iHD. Will check for HD needs and hemodynamic stability tomorrow.-Dose adjust medications to CrCl <10 mL/s-Continue nephroviteDialysis access: -HDTCElectrolytes:-Most recent potassium level 5.0, temporize and notify nephrology for worsening hyperkalemia refractory to medical management.-Na 133CKD-BMD:-Ca 8.7-Phos 5.2-Continue cinacalcet  60 mgHemodynamics: HTN/Hypervolemia-Hypotensive, on norepinephrine .-Minimal o2 requirements.-Will remove fluid with HD as hemodynamics allow.Anemia: Hemoglobin goal (chronically) 10-11.5 gm/d-Hgb 10.1-No indication for ESA at this time.No urgent indications for dialysis today. Patient with no overt signs of fluid overload, labs fairly stable from nephrology standpoint. Next dialysis per schedule. Remainder of management per primary team.Thank you for allowing us  to participate in the care of this patient.Shane Pearson, MDNephrology DivisionUniversity of El Dara Medical Center9/01/2024 10:35 AM

## 2023-11-21 NOTE — Plan of Care (Signed)
 Problem: SafetyGoal: Patient will remain free of fallsOutcome: Maintaining Problem: Pain/ComfortGoal: Patient's pain or discomfort is manageableOutcome: Maintaining No pain noted this shift. Problem: Cognitive functionGoal: Patient maintains appropriate nutritional intakeOutcome: Maintaining Patient remains NPO. Will attempt bedside swallow eval. To advance. Emannuel Vise, RN

## 2023-11-21 NOTE — Progress Notes (Addendum)
 Physical Therapy Treatment NoteTherapy Recommendations:Discharge Recommendations:  SNF Rehab (vs home with 24/7 assist at dependent hoyer level):     If the patient's support system identifies the ability to provide assistance, then a home plan could be considered with the equipment recommendations below. Recommendations: PT Discharge Equipment Recommended: (P) Full Mechanical lift, Manual wheelchair, Ramp, Hospital bed, Manual Wheelchair Options: Seat Cushion, Seatbelt, Anti-Tippers (20)Additional justification: Hospital Bed: Patient requires a hospital bed for frequent changes in body position and/or has an immediate need for a change in body position and is able to control the bed on their own: They also require a hospital bed for the following reasons: medical condition which requires positioning of the body in ways not feasible with an ordinary bed.Wheelchair:  20 inch manual wheelchair with swing away arm rests, elevating leg rests, and standard cushion: The patient requires a manual wheelchair to perform mobility related activities of daily living, and these cannot be performed safely using a walker or cane. The wheelchair will be their primary source of mobility and they have the physical ability to self-propel. Hoyer:  The patient requires the assistance of more than one person to transfer between bed and chair, wheelchair or commode. Without the use of the hoyer lift, the patient will be bed confined.PT Positioning Recommendations: Up to chair for mealsPT Mobility Recommendations: hover/hoyerPT Referral Recommendations: OT, SW 11/21/23 1116 PT Tracking PT TRACKING PT Assigned Visit Number Visit Number [facility]) / Treatment Day (HH) 3 Visit Details [facility]) Visit Type [facility]) Follow Up-General Precautions/Observations Precautions used Yes Weight Bearing Status RUE NWB;LLE WBAT;RLE WBAT Brace Applied Yes Brace Type Sling Fall Precautions General falls  precautions Activity Orders Present Yes LDA Observation Dialysis catheter;Central Line;Drain;Monitors;IV lines(12 of levo) Drain Type (chole tube) Vital Signs Response with Therapy VSS, increased BP with sitting EOB. HR to 137 bpm with sitting Isolation Precautions None Was patient wearing a mask? No Was Visitor Present No PPE worn by Clinical research associate Gloves Patient Subjective I feel my legs Other Generalized edema noted Cognition Cognition Tested Arousal/Alertness Delayed responses to stimuli Orientation Oriented to person;Oriented to place Ability to Follow Instructions Follows simple commands with repetition;Follows simple commands with increased time Additional Comments Pt easily distracted and with some perseveration on phrases throughout. Pt requiring consistent repetition of cueing to maintain attention on task and for safety and technique. Increased restlessness near end of session. Bed Mobility Bed mobility Tested Supine to Sit Moderate assist;Maximum assist;2 person assist;Side rails up (#);Head of bed elevated Sit to Supine Moderate assist;Maximum assist;2 person assist;Side rails up (#);Head of bed flat Additional comments Increased time and effort to initiate working LE over to EOB. Assist to help fully transition LE over to EOB and down into dependent position. Hand over hand assist and modAx2 to help successfully transition trunk into full upright sitting position. Intermittent difficulties maintaining static sitting balance today with posterior lean. When performing sit>supine, pt rquiring one assist to lift LE into bed and second assist to transition trunk. Transfers Transfers Not tested Additional comments Pt with difficulties maintaining consistent sitting balance today requiring assist, therefore attempts of transfers deferred Mobility Mobility: Gait/Stairs Not tested (comment) Training and Education Patient Pt educated on role of PT in hosptial  and importance of daily OOB mobility with assist. Balance Balance Tested Sitting - Static Minimum assist Sitting - Dynamic Mod assist Additional comments Pt able to sit EOB ~10 mins, posterior lateral lean noted. Intermittently able to utilize LUE to assist with pulling self forwards for correction. PT AM-PAC Mobility  Turning over in bed? 2 Moving from lying on back to sitting on the side of the bed? 2 Moving to and from a bed to a chair? 1 Sitting down on and standing up from a chair with arms? 1 Need to walk in hospital room? 1 Climbing 3 - 5 steps with a railing? 1 Total Raw Score 8 Standardized Score - Calculated 28.58 AM-PAC T-Scale Score 22.61 Assessment Brief Assessment Remains appropriate for skilled therapy Patient / Family Goal To rest Overall Assessment Pt continues to be limited in functional mobility by decreased strength, decreased stability and decreased endurance. Pt will likely benefit the most from additional rehabiliation at SNF-R faciltiy to help increase overall safety and independence with functional mobility when medically ready. Per SW, pt's wife does not think d/c to SNF would be what pt wants. Per SW, pt's wife willing to take pt home at [facility] level and obtain a ramp if needed. Will continue to see pt for PT during hospital stay to progress as able and assist with d.c planning. Plan/Recommendation PT Treatment Interventions Bed mobility training;Transfers training;Wheelchair training;Pt/Family education;Strengthening;Gait training;Family Training;Home exercise program instruction;D/C planning;Will work to minimize pain while promoting mobility whenever possible;Positioning PT Frequency 3-5 x/wk PT Positioning Recommendations Up to chair for meals PT Mobility Recommendations hover/hoyer PT Referral Recommendations OT;SW PT Discharge Recommendations Skilled Nursing Facility Rehab(vs home with 24/7 assist at [facility] level pending pt and  family's wishes) PT Discharge Equipment Recommended Full Mechanical lift;Manual wheelchair;Ramp;Hospital bed Manual Wheelchair Options Seat Cushion;Seatbelt;Anti-Tippers(20) Musician vs hoyer to wheelchair PT Assessment/Recommendations Reviewed With: Patient;Nursing;Occupational Therapy;Social Worker Next PT Visit Progress sitting balance, work on transfers as able PT needs to see patient prior to DC  No Time Calculation Total Time Therapeutic Activities (minutes) 14 Total Time Gait Training (minutes) 0 Total Time Therapeutic Exercises (minutes) 0 Total Time Neuromuscular Re-education (minutes) 0 Total Time Caregiver Training (minutes) 0 Total Time Group Therapy (minutes) 0 PT Timed Codes 14 PT Untimed Codes 0 PT Unbilled Time 14(Co-tx with OT) PT Total Treatment 14 Plan and Onset date Plan of Care Date 11/17/23 Onset Date 11/13/23 Treatment Start Date 11/17/23 Camie Heater, PT, DPTPlease contact physical therapist on the treatment team via secure chat or via the secure chat group for your unit Physical Therapist with any questions.On weekends, please utilize the secure chat group, [facility] Physical Therapy 1st call, to contact PT.

## 2023-11-21 NOTE — Progress Notes (Signed)
 Report Given ToJessica RNActive Issues / Relevant Events Nursing Note MICU [id number]:Afebrile. Levo titrated to maintain SBP >90- currently on 14. Wife updated bedside. Plan for HD today- see how he tolerates it. Nephro saw patient and wants to hold off on HD today- will reevaluate tomorrow. Clamp NGT- Diet pending bedside swallow eval. Tolerated ice chips well. VBG sent- confused/dyspnea, providers saw patient bedside. Plan for LUE xrays tonight- call when able to come down. See flowsheets/MAR for additional details. Tinley Rought, RN

## 2023-11-21 NOTE — Progress Notes (Shared)
 [facility] REFERRAL INFORMATION NOTE  DELAY in [facility] readmitted  9/3/25Hospital discharge date:The following services have been arranged with [facility] [phone]:____Nursing _X___Physical Therapy _X___Occupational Therapy____Speech Therapist____Medical Social Worker____Home Health AideThe agency will call to schedule first home visit date for (CHN,PT etc) services within 24-48 hours of facility discharge, and pt/family are in agreement.Special instructions for scheduling first visit : ***  Please call with visit times:  HD TTSThe following medical equipment and/or supplies have been arranged through:____walker____wheelchair____commode____shower chair____Personal Emergency Response System____oxygen____cane____hospital bed____infusion supplies____tube feeding supplies____catheter supplies____ostomy supplies____wound care supplies____other: ____No medical equipment and/or supplies have been arranged.Blood draw: ***Focus of care and identified patient/caregiver teaching needs at home: ***  Fall, mildly displaced proximal humeral shaft fx, Septic shock, Resp Failure, Cholecystitis s/p PERC Chole Tube placed, The above arrangements are based on an in-hospital evaluation. It is short-term and will be re-evaluated by the Home Health Care Nurse in the home on a regular basis, as per physician's order.

## 2023-11-21 NOTE — Progress Notes (Signed)
 TreatmentDischarge Recommendations:  Dual plan: SNF Rehab vs. Home. Per discussion with SW: pt's wife would like to bring him home no matter what - even if at [facility] level. Not interested in SNF Rehab.OT need to see patient prior to discharge: YesEquipment Recommendations: TBDOT Hospital Stay Recommendations: Encourage participation with ADLs and OOB for mealsOT Referral Recommendations: Physical Therapy and Social WorkSUBJECTIVE and OBJECTIVE:Precautions: Fall Precautions: General fall precautionsWeight Bearing Status: WBAT RUE, NWB RLE, LLE  L/D/A: IV lines and MonitorsActivity order: Activity as toleratedPatient wearing mask: NoWriter wearing PPE including: GlovesVitals: Vital signs stable, BP assessed throughout sessionSubjective: I can feel my legs! Pt repeating this throughout session, unable to clarify.Co-treat with Physical Therapy for safety, patient tolerance, need for 2 skilled therapists, complexity of task, and complementary goalsPain: None reported when asked.TREATMENTCognition: Alertness - AlertOrientation - Person, PlaceAttention - ImpairedShort term memory - ImpairedFollowing commands - ImpairedAwareness- ImpairedUE Function: ROM/Strength: Pt is notably edematous throughout BUE. Positioned on pillows at end of session while in chair position.RUE: NWB, in slingLUE: Generalized weaknessFunctional mobility:Supine to sit: maximum assist, dependent, of 2, HOB elevatedSit to supine: maximum assist, dependent, of 2, HOB flatCGA-Mod/max assist to maintain sitting balance, fluctuating throughout.    ADL Assessment: ADLs completed/ simulated with level of assist as dictated below: LB Dressing: dependent Toileting: dependent ASSESSMENT and PLANAssessment: Patient making progress, continue with POCFrequency: 3-5x/weekPatient Education: role of OT and plan of care Learners:  patient Readiness: acceptance Method: explanation Response: needs reinforcement and no evidence of learningMultidisciplinary Communication:  patient, RN, PT  OCCUPATIONAL THERAPY PROVIDER Electronically Signed By: [place] Keyanna Sandefer, [place] contact the OT via Secure Chat to: [facility] Occupational Therapy First Call with any questions/concerns and/or update requests.Timed Calculations:Timed Codes:  14 ADLUntimed Codes: 0Unbilled Time: 14 co-treat with PTTotal Time:  28

## 2023-11-21 NOTE — Progress Notes (Signed)
 Critical Care Attending Physician noteSYNOPSIS OF OUR ASSESSMENT AND PLANS:Septic shock - on levophed , stress dose steroidsWeaning levophedRespiratory failure - improved, now on RAMalignant L effusion, no need to tap right nowCholecystitis - evaluated by EGS, IR placed a percutaneous cholecystostomy tubeCultures growing GPCsCefipime, Flagyl , VancMetastatic RCC with malignant pleural effusionSpinal and intracranial metsAdrenal insufficiency - on stress dose steroids; fluricort, hydrocortESRD - HD TThS, HD planned for todayGI - was NPO, attempt advance dietPathologic humeral fracture - pending fixationProphylaxis - on lovenoxDNR/DNIIf there are key historical elements or objective findings that I think deserve particular emphasis, I have recorded them below.  A summary of key elements of our assessment and plans is listed above.  Please refer to APP's note for complete details of our mutually agreed upon findings, assessment, and recommendations.63 y.o. 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 He had undergone pre-emptive IR coil embolization in anticipation of pathologic humeral fracture reduction on 9/9, but developed hypotension and transferred to MICU.Objective findings include:Awake, oriented, responsiveHeart RRLungs equal air entry anteriorly, rales in LLL fieldAbd softExt no edema_______________________________________________________________________________This patient was evaluated on rounds with the multidisciplinary ICU team.  I personally examined the patient.  All nursing documentation, laboratory data, test results, and radiographs were reviewed and interpreted by me.  I have established the management plan  for this patient's critical illness and have been immediately available to assist with patient care.  I agree with the database, findings, and plan of care recorded in the resident physician note.This patient is critically ill with at least 1 organ system failure associated with a high probability of imminent or life threatening deterioration.  The care I have delivered involves high complexity decision making to assess, manipulate, and support vital system function(s), to treat the vital organ system failure and/or prevent further life threatening deterioration of the patient's condition.  critical care time: I spent 35 minutes personally attending to this patient's critical care needs.  This critical care time is exclusive of any time for separately billable procedures.  My documented total critical care time reflects my own patient care time and does not include teaching or procedure time.

## 2023-11-22 ENCOUNTER — Inpatient Hospital Stay

## 2023-11-22 ENCOUNTER — Ambulatory Visit: Payer: Self-pay | Admitting: Radiation Oncology

## 2023-11-22 ENCOUNTER — Inpatient Hospital Stay: Admitting: Radiology

## 2023-11-22 DIAGNOSIS — K819 Cholecystitis, unspecified: Secondary | ICD-10-CM

## 2023-11-22 DIAGNOSIS — C649 Malignant neoplasm of unspecified kidney, except renal pelvis: Secondary | ICD-10-CM

## 2023-11-22 DIAGNOSIS — J90 Pleural effusion, not elsewhere classified: Secondary | ICD-10-CM

## 2023-11-22 DIAGNOSIS — R10817 Generalized abdominal tenderness: Secondary | ICD-10-CM

## 2023-11-22 DIAGNOSIS — A419 Sepsis, unspecified organism: Secondary | ICD-10-CM

## 2023-11-22 DIAGNOSIS — R6521 Severe sepsis with septic shock: Secondary | ICD-10-CM

## 2023-11-22 DIAGNOSIS — Z978 Presence of other specified devices: Secondary | ICD-10-CM

## 2023-11-22 DIAGNOSIS — G9340 Encephalopathy, unspecified: Secondary | ICD-10-CM

## 2023-11-22 LAB — ART GASES / WHOLE BLOOD PANEL
Base Excess, Arterial: -10 mmol/L — ABNORMAL LOW (ref <=2)
Base Excess, Arterial: -10 mmol/L — ABNORMAL LOW (ref <=2)
CO2,ART (Calc): 17 mmol/L — ABNORMAL LOW (ref 23–28)
CO2,ART (Calc): 18 mmol/L — ABNORMAL LOW (ref 23–28)
CO: 0.9 %
CO: 0.9 %
Chloride,WB: 98 mmol/L (ref 96–108)
Chloride,WB: 100 mmol/L (ref 96–108)
FO2 Hb, Arterial: 91 % (ref 90–100)
FO2 Hb, Arterial: 90 % (ref 90–100)
Glucose,WB: 127 mg/dL — ABNORMAL HIGH (ref 60–99)
Glucose,WB: 159 mg/dL — ABNORMAL HIGH (ref 60–99)
HCO3, Arterial: 16 mmol/L — ABNORMAL LOW (ref 21–26)
HCO3, Arterial: 16 mmol/L — ABNORMAL LOW (ref 21–26)
Hemoglobin: 11.4 g/dL — ABNORMAL LOW (ref 12.0–17.0)
Hemoglobin: 11.4 g/dL — ABNORMAL LOW (ref 12.0–17.0)
ICA @7.4,WB: 4.4 mg/dL — ABNORMAL LOW (ref 4.8–5.2)
ICA @7.4,WB: 4.5 mg/dL — ABNORMAL LOW (ref 4.8–5.2)
ICA Uncorr,WB: 4.6 mg/dL — ABNORMAL LOW (ref 4.8–5.2)
ICA Uncorr,WB: 4.7 mg/dL — ABNORMAL LOW (ref 4.8–5.2)
Lactate ART,WB: 2.1 mmol/L — ABNORMAL HIGH (ref 0.3–0.8)
Lactate ART,WB: 2.8 mmol/L — ABNORMAL HIGH (ref 0.3–0.8)
Methemoglobin: 2.7 % — ABNORMAL HIGH (ref 0.0–1.0)
Methemoglobin: 2.6 % — ABNORMAL HIGH (ref 0.0–1.0)
NA, WB: 129 mmol/L — ABNORMAL LOW (ref 135–145)
NA, WB: 128 mmol/L — ABNORMAL LOW (ref 135–145)
Potassium,WB: 5.8 mmol/L — ABNORMAL HIGH (ref 3.3–4.6)
Potassium,WB: 5.1 mmol/L — ABNORMAL HIGH (ref 3.3–4.6)
pCO2, Arterial: 36 mmHg (ref 35–46)
pCO2, Arterial: 35 mmHg (ref 35–46)
pH: 7.26 — ABNORMAL LOW (ref 7.35–7.45)
pH: 7.28 — ABNORMAL LOW (ref 7.35–7.45)
pO2,Arterial: 80 mmHg (ref 80–100)
pO2,Arterial: 78 mmHg — ABNORMAL LOW (ref 80–100)

## 2023-11-22 LAB — RENAL FUNCTION PANEL
Albumin: 3 g/dL — ABNORMAL LOW (ref 3.5–5.2)
Albumin: 3.2 g/dL — ABNORMAL LOW (ref 3.5–5.2)
Anion Gap: 19 — ABNORMAL HIGH (ref 7–16)
Anion Gap: 24 — ABNORMAL HIGH (ref 7–16)
CO2: 19 mmol/L — ABNORMAL LOW (ref 20–28)
CO2: 16 mmol/L — ABNORMAL LOW (ref 20–28)
Calcium: 8.8 mg/dL (ref 8.6–10.2)
Calcium: 9 mg/dL (ref 8.6–10.2)
Chloride: 95 mmol/L — ABNORMAL LOW (ref 96–108)
Chloride: 95 mmol/L — ABNORMAL LOW (ref 96–108)
Creatinine: 7.16 mg/dL — ABNORMAL HIGH (ref 0.67–1.17)
Creatinine: 5.28 mg/dL — ABNORMAL HIGH (ref 0.67–1.17)
Glucose: 117 mg/dL — ABNORMAL HIGH (ref 60–99)
Glucose: 143 mg/dL — ABNORMAL HIGH (ref 60–99)
Lab: 81 mg/dL — ABNORMAL HIGH (ref 6–20)
Lab: 63 mg/dL — ABNORMAL HIGH (ref 6–20)
Phosphorus: 6.1 mg/dL — ABNORMAL HIGH (ref 2.7–4.5)
Phosphorus: 5.3 mg/dL — ABNORMAL HIGH (ref 2.7–4.5)
Potassium: 5.9 mmol/L — ABNORMAL HIGH (ref 3.3–5.1)
Potassium: 5.3 mmol/L — ABNORMAL HIGH (ref 3.3–5.1)
Sodium: 133 mmol/L (ref 133–145)
Sodium: 135 mmol/L (ref 133–145)
eGFR BY CREAT: 8 — AB
eGFR BY CREAT: 11 — AB

## 2023-11-22 LAB — BASIC METABOLIC PANEL
Anion Gap: 26 — ABNORMAL HIGH (ref 7–16)
Anion Gap: 25 — ABNORMAL HIGH (ref 7–16)
CO2: 14 mmol/L — ABNORMAL LOW (ref 20–28)
CO2: 15 mmol/L — ABNORMAL LOW (ref 20–28)
Calcium: 8.2 mg/dL — ABNORMAL LOW (ref 8.6–10.2)
Calcium: 8.3 mg/dL — ABNORMAL LOW (ref 8.6–10.2)
Chloride: 95 mmol/L — ABNORMAL LOW (ref 96–108)
Chloride: 95 mmol/L — ABNORMAL LOW (ref 96–108)
Creatinine: 6.84 mg/dL — ABNORMAL HIGH (ref 0.67–1.17)
Creatinine: 7.11 mg/dL — ABNORMAL HIGH (ref 0.67–1.17)
Glucose: 97 mg/dL (ref 60–99)
Glucose: 82 mg/dL (ref 60–99)
Lab: 74 mg/dL — ABNORMAL HIGH (ref 6–20)
Lab: 82 mg/dL — ABNORMAL HIGH (ref 6–20)
Potassium: 6.1 mmol/L — ABNORMAL HIGH (ref 3.3–5.1)
Sodium: 135 mmol/L (ref 133–145)
Sodium: 135 mmol/L (ref 133–145)
eGFR BY CREAT: 8 — AB
eGFR BY CREAT: 8 — AB

## 2023-11-22 LAB — VENOUS GASES / WHOLE BLOOD PANEL
Base Excess,VENOUS: -9 mmol/L — ABNORMAL LOW (ref <=2)
Bicarbonate,VENOUS: 17 mmol/L — ABNORMAL LOW (ref 21–28)
CO2 (Calc),VENOUS: 18 mmol/L — ABNORMAL LOW (ref 22–31)
CO: 0.7 %
Chloride,WB: 98 mmol/L (ref 96–108)
FO2 HB,VENOUS: 57 % — ABNORMAL LOW (ref 63–83)
Glucose,WB: 81 mg/dL (ref 60–99)
Hemoglobin: 10.6 g/dL — ABNORMAL LOW (ref 12.0–17.0)
ICA @7.4,WB: 4.3 mg/dL — ABNORMAL LOW (ref 4.8–5.2)
ICA Uncorr,WB: 4.5 mg/dL — ABNORMAL LOW (ref 4.8–5.2)
Lactate VEN,WB: 3.4 mmol/L — ABNORMAL HIGH (ref 0.5–2.2)
Methemoglobin: 2 % — ABNORMAL HIGH (ref 0.0–1.0)
NA, WB: 130 mmol/L — ABNORMAL LOW (ref 135–145)
PCO2,VENOUS: 37 mmHg — ABNORMAL LOW (ref 40–50)
PH,VENOUS: 7.27 — ABNORMAL LOW (ref 7.32–7.42)
PO2,VENOUS: 39 mmHg (ref 30–43)
Potassium,WB: 5.6 mmol/L — ABNORMAL HIGH (ref 3.3–4.6)

## 2023-11-22 LAB — CBC AND DIFFERENTIAL
Baso # K/uL: 0 THOU/uL (ref 0.0–0.2)
Eos # K/uL: 0 THOU/uL (ref 0.0–0.5)
Hematocrit: 33 % — ABNORMAL LOW (ref 37–52)
Hemoglobin: 9.7 g/dL — ABNORMAL LOW (ref 12.0–17.0)
Lymph # K/uL: 0.3 THOU/uL — ABNORMAL LOW (ref 1.0–5.0)
MCV: 90 fL (ref 75–100)
Mono # K/uL: 2 THOU/uL — ABNORMAL HIGH (ref 0.1–1.0)
Nucl RBC # K/uL: 2.2 THOU/uL — ABNORMAL HIGH (ref 0.0–0.1)
Nucl RBC # K/uL: 23 THOU/uL — ABNORMAL HIGH (ref 1.5–6.5)
Nucl RBC %: 8.6 /100{WBCs} — ABNORMAL HIGH (ref 0.0–0.2)
Platelets: 675 THOU/uL — ABNORMAL HIGH (ref 150–450)
RBC: 3.6 MIL/uL — ABNORMAL LOW (ref 4.0–6.0)
RDW: 23.9 % — ABNORMAL HIGH (ref 0.0–15.0)
Seg Neut %: 89.1 /100{WBCs} — AB (ref 0.0–0.2)
WBC: 25.8 THOU/uL — ABNORMAL HIGH (ref 3.5–11.0)

## 2023-11-22 LAB — POCT GLUCOSE
Glucose POCT: 97 mg/dL (ref 60–99)
Glucose POCT: 144 mg/dL — ABNORMAL HIGH (ref 60–99)
Glucose POCT: 128 mg/dL — ABNORMAL HIGH (ref 60–99)

## 2023-11-22 LAB — PHOSPHORUS: Phosphorus: 6.1 mg/dL — ABNORMAL HIGH (ref 2.7–4.5)

## 2023-11-22 LAB — PROTEIN, BODY FLUID: Protein,FL: 3.3 g/dL

## 2023-11-22 LAB — PH, BODY FLUID: pH,FL: 8

## 2023-11-22 LAB — TOBRAMYCIN LEVEL, PEAK: Tobramycin Peak: 15.2 ug/mL — ABNORMAL HIGH (ref 5.0–10.0)

## 2023-11-22 LAB — HEPATIC FUNCTION PANEL
ALT: <5 U/L (ref 0–50)
AST: 35 U/L (ref 0–50)
Albumin: 3 g/dL — ABNORMAL LOW (ref 3.5–5.2)
Alk Phos: 254 U/L — ABNORMAL HIGH (ref 40–130)
Bilirubin,Direct: <0.2 mg/dL (ref 0.0–0.3)
Bilirubin,Total: 0.2 mg/dL (ref 0.0–1.2)
Total Protein: 5.8 g/dL — ABNORMAL LOW (ref 6.3–7.7)

## 2023-11-22 LAB — MAGNESIUM
Magnesium: 2.2 mg/dL (ref 1.6–2.5)
Magnesium: 2.2 mg/dL (ref 1.6–2.5)

## 2023-11-22 LAB — TRIGLYCERIDES, BODY FLUIDS: Triglycerides,FL: 41 mg/dL

## 2023-11-22 LAB — LACTATE DEHYDROGENASE, BODY FLUID: LD,FL: 822 U/L

## 2023-11-22 LAB — DIFF MANUAL
Diff Based On: 118 {cells}
Metamyelocyte %: 3 % — ABNORMAL HIGH (ref 0–1)

## 2023-11-22 LAB — APTT: aPTT: 61.1 s — ABNORMAL HIGH (ref 25.8–37.9)

## 2023-11-22 LAB — GRAM STAIN: Gram Stain: 0

## 2023-11-22 LAB — POTASSIUM: Potassium: 5.8 mmol/L — ABNORMAL HIGH (ref 3.3–5.1)

## 2023-11-22 LAB — AEROBIC BACTERIAL WOUND OR DRAINAGE CULTURE AND STAIN

## 2023-11-22 LAB — GLUCOSE, BODY FLUID: Glucose,FL: 89 mg/dL

## 2023-11-22 MED ORDER — VASOPRESSIN 44 UNITS IN SODIUM CHLORIDE 0.9% 112.2 ML (0.4 UNITS/ML) *I*
0.0100 [IU]/min | INTRAVENOUS | Status: DC
Start: 2023-11-22 — End: 2023-11-25
  Administered 2023-11-22 – 2023-11-24 (×40): 0.04 [IU]/min via INTRAVENOUS
  Administered 2023-11-24: 0.02 [IU]/min via INTRAVENOUS
  Administered 2023-11-24 (×2): 0.04 [IU]/min via INTRAVENOUS
  Administered 2023-11-24: 0.02 [IU]/min via INTRAVENOUS
  Administered 2023-11-24: 0.04 [IU]/min via INTRAVENOUS
  Administered 2023-11-24 (×2): 0.02 [IU]/min via INTRAVENOUS
  Administered 2023-11-24: 0.04 [IU]/min via INTRAVENOUS
  Administered 2023-11-24: 0.02 [IU]/min via INTRAVENOUS
  Administered 2023-11-24 (×7): 0.04 [IU]/min via INTRAVENOUS
  Administered 2023-11-24 (×3): 0.02 [IU]/min via INTRAVENOUS
  Administered 2023-11-24 (×2): 0.04 [IU]/min via INTRAVENOUS
  Administered 2023-11-24 (×2): 0.02 [IU]/min via INTRAVENOUS
  Administered 2023-11-24 (×2): 0.04 [IU]/min via INTRAVENOUS
  Administered 2023-11-24: 0.02 [IU]/min via INTRAVENOUS
  Administered 2023-11-24 – 2023-11-25 (×11): 0.04 [IU]/min via INTRAVENOUS
  Administered 2023-11-25: 0.02 [IU]/min via INTRAVENOUS
  Administered 2023-11-25 (×7): 0.04 [IU]/min via INTRAVENOUS
  Administered 2023-11-25: 0.02 [IU]/min via INTRAVENOUS
  Administered 2023-11-25 (×4): 0.04 [IU]/min via INTRAVENOUS
  Administered 2023-11-25: 0.02 [IU]/min via INTRAVENOUS
  Administered 2023-11-25 (×12): 0.04 [IU]/min via INTRAVENOUS
  Filled 2023-11-22 (×6): qty 44

## 2023-11-22 MED ORDER — DEXTROSE 50 % IV SOLN *I*
25.0000 g | Freq: Once | INTRAVENOUS | Status: AC
Start: 2023-11-22 — End: 2023-11-22
  Administered 2023-11-22: 25 g via INTRAVENOUS
  Filled 2023-11-22: qty 50

## 2023-11-22 MED ORDER — PRISMASOL BGK 4/2.5 REPLACEMENT FLUID SOLUTION *I*
800.0000 mL/h | Status: DC
Start: 2023-11-22 — End: 2023-11-25
  Administered 2023-11-22 – 2023-11-25 (×15): 800 mL/h via INTRAVENOUS_CENTRAL

## 2023-11-22 MED ORDER — IOHEXOL 350 MG/ML (OMNIPAQUE) IV SOLN 500ML BOTTLE *I*
1.0000 mL | Freq: Once | INTRAVENOUS | Status: AC
Start: 2023-11-22 — End: 2023-11-22
  Administered 2023-11-22: 116 mL via INTRAVENOUS

## 2023-11-22 MED ORDER — DEXTROSE 50 % IV SOLN *I*
25.0000 g | INTRAVENOUS | Status: AC | PRN
Start: 2023-11-22 — End: 2023-11-22

## 2023-11-22 MED ORDER — MEROPENEM 500 MG IN 58 ML NACL MINI BAG *I*
500.0000 mg | Freq: Four times a day (QID) | INTRAVENOUS | Status: DC
Start: 2023-11-22 — End: 2023-11-25
  Administered 2023-11-22 – 2023-11-25 (×13): 500 mg via INTRAVENOUS
  Filled 2023-11-22: qty 58
  Filled 2023-11-22 (×2): qty 500
  Filled 2023-11-22: qty 58
  Filled 2023-11-22: qty 500
  Filled 2023-11-22 (×2): qty 58
  Filled 2023-11-22: qty 500
  Filled 2023-11-22 (×2): qty 58
  Filled 2023-11-22 (×3): qty 500
  Filled 2023-11-22 (×2): qty 58
  Filled 2023-11-22 (×4): qty 500
  Filled 2023-11-22 (×3): qty 58
  Filled 2023-11-22 (×2): qty 500

## 2023-11-22 MED ORDER — TOBRAMYCIN SULFATE 1.2 GM/30ML IJ SOLN *I*
550.0000 mg | Freq: Once | Status: AC
Start: 2023-11-22 — End: 2023-11-22
  Administered 2023-11-22: 550 mg via INTRAVENOUS
  Filled 2023-11-22: qty 13.75

## 2023-11-22 MED ORDER — CEFEPIME 2GM IN D5W 110 ML IVPB *I*
2000.0000 mg | Freq: Two times a day (BID) | INTRAVENOUS | Status: DC
Start: 2023-11-22 — End: 2023-11-22

## 2023-11-22 MED ORDER — CALCIUM GLUCONATE 9.4 MEQ (2,000 MG) IN NS 110 ML *I*
9.4000 meq | Freq: Once | INTRAVENOUS | Status: AC
Start: 2023-11-22 — End: 2023-11-22
  Administered 2023-11-22: 9.4 meq via INTRAVENOUS
  Filled 2023-11-22: qty 110

## 2023-11-22 MED ORDER — PRISMASOL BGK 4/2.5 REPLACEMENT FLUID SOLUTION *I*
200.0000 mL/h | Status: DC
Start: 2023-11-22 — End: 2023-11-25
  Administered 2023-11-22 – 2023-11-25 (×8): 200 mL/h via INTRAVENOUS_CENTRAL

## 2023-11-22 MED ORDER — SODIUM CHLORIDE 0.9 % IV SOLN WRAPPED *I*
70.0000 mg | Status: DC
Start: 2023-11-22 — End: 2023-11-25
  Administered 2023-11-22 – 2023-11-25 (×4): 70 mg via INTRAVENOUS
  Filled 2023-11-22 (×4): qty 10

## 2023-11-22 MED ORDER — TOBRAMYCIN IV - PHARMACIST TO DOSE PLACEHOLDER *I*
Status: DC
Start: 2023-11-22 — End: 2023-11-25

## 2023-11-22 MED ORDER — HYDROMORPHONE HCL PF 1 MG/ML IJ SOLN *WRAPPED*
0.2500 mg | INTRAMUSCULAR | Status: DC | PRN
Start: 2023-11-22 — End: 2023-11-24
  Administered 2023-11-22 – 2023-11-24 (×4): 0.25 mg via INTRAVENOUS
  Filled 2023-11-22 (×4): qty 0.5

## 2023-11-22 MED ORDER — INSULIN REGULAR HUMAN 100 UNIT/ML IJ SOLN *I*
10.0000 [IU] | Freq: Once | INTRAMUSCULAR | Status: AC
Start: 2023-11-22 — End: 2023-11-22
  Administered 2023-11-22: 10 [IU] via INTRAVENOUS

## 2023-11-22 MED ORDER — MEROPENEM 1 GM IN 110 ML NACL MINI BAG *I*
1000.0000 mg | Freq: Once | INTRAVENOUS | Status: AC
Start: 2023-11-22 — End: 2023-11-22
  Administered 2023-11-22: 1000 mg via INTRAVENOUS
  Filled 2023-11-22: qty 1

## 2023-11-22 MED ORDER — PRISMASATE BK 0/3.5 DIALYSATE SOLUTION *I*
800.0000 mL/h | INTRAVENOUS_CENTRAL | Status: DC
Start: 2023-11-22 — End: 2023-11-25
  Administered 2023-11-22 – 2023-11-25 (×13): 800 mL/h via INTRAVENOUS_CENTRAL
  Filled 2023-11-22: qty 5000
  Filled 2023-11-22: qty 15000
  Filled 2023-11-22 (×10): qty 5000

## 2023-11-22 MED ORDER — PANTOPRAZOLE SODIUM 40 MG IV SOLR *I*
40.0000 mg | INTRAVENOUS | Status: DC
Start: 2023-11-22 — End: 2023-11-24
  Administered 2023-11-22 – 2023-11-24 (×3): 40 mg via INTRAVENOUS
  Filled 2023-11-22 (×4): qty 10

## 2023-11-22 MED ORDER — HYDROMORPHONE HCL PF 1 MG/ML IJ SOLN *WRAPPED*
INTRAMUSCULAR | Status: AC
Start: 2023-11-22 — End: 2023-11-22
  Administered 2023-11-22: 0.25 mg via INTRAVENOUS
  Filled 2023-11-22: qty 1

## 2023-11-22 MED ORDER — LINEZOLID 600 MG/300ML IV SOLN *I*
600.0000 mg | Freq: Two times a day (BID) | INTRAVENOUS | Status: DC
Start: 2023-11-22 — End: 2023-11-25
  Administered 2023-11-22 – 2023-11-25 (×7): 600 mg via INTRAVENOUS
  Filled 2023-11-22 (×12): qty 300

## 2023-11-22 NOTE — Progress Notes (Signed)
 Tobramycin  IV  (maintenance dosing) - Pharmacist to DosePatient is received tobramycin  550 mg x1 and is being treated for Empiric Therapy/Sepsis. Today is day 1 of therapy.Goal tobramycin  peak is 16-20 mcg/mL.  Goal tobramycin  trough is <0.5 mcg/mL.Laboratory DataSCr:   Lab results: 09/12/251422 09/12/250749 09/11/252349 Creatinine 7.16* 7.11* 6.84* BUN:   Lab results: 09/12/251422 09/12/250749 09/11/252349 UN 81* 82* 74*   Lab results: 09/12/251758 Tobramycin  Peak 15.2*  Assessment and PlanConsidering patient's renal function, volume status, clinical status, and reported concentrations, no dose needed at this time.Next tobramycin  concentration: will be clinically determined. Orders have not been placed at this time.Pharmacist will follow for changes in renal function, toxicity, and efficacy and order serum concentrations and creatinine as needed.For questions contact pharmacy at extension [phone] Diona, PharmD

## 2023-11-22 NOTE — Progress Notes (Signed)
 Tobramycin  IV (initial dosing) - Pharmacist to Lacey Dotson is a 63 y.o. male who has been consulted for tobramycin  dosing and is being treated for Empiric Therapy/Sepsis using Critically-ill patient dosing strategy.Goal tobramycin  peak is 16-20 mcg/mL.  Goal tobramycin  trough is <0.5 mcg/mL.Laboratory DataSCr:   Lab results: 09/12/251422 09/12/250749 09/11/252349 Creatinine 7.16* 7.11* 6.84* BUN:   Lab results: 09/12/251422 09/12/250749 09/11/252349 UN 81* 82* 74* Estimated CrCl: CRRTAssessment and PlanWill start patient at a dose of 550 mg ONCE. Next tobramycin  concentrations: peak to be drawn one hour after the end of infusionPharmacist will follow for changes in renal function, toxicity, and efficacy and order serum concentrations and creatinine as needed.For questions contact pharmacy at extension [phone] Diona, PharmD

## 2023-11-22 NOTE — Provider Consult (Addendum)
 INFECTIOUS DISEASES CONSULTATIONREFERRED BY: MICU CHIEF COMPLAINT / REASON FOR CONSULTATION: shockHISTORY OF PRESENT ILLNESS: This is a 63 y.o. 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  He's been followed by orthopedics during admission and is s/p prophylactic fixation of R femur on 9/6, embolization of R proximal humerus lesion on 9/8 by IR in preparation for fixation of R humerus fracture scheduled for 9/9.  However, patient developed multiple episodes of hypotension on evening of 9/8 initially responsive to fluids.  Hypotension persisted on 9/9 despite 3.5L IV fluids over 24 hours.  Patient developed mild hypoxic respiratory failure requiring 2L NC.  CT chest, abdomen, and pelvis obtained notable for large L pleural fluid, new GGOs in R middle and lower lobes, evidence of possible metastatic disease in chest, severely dilated gallbladder, high density small volume perihepatic and pelvic fluid, and small volume intermediate free pelvic fluid.  RUQ U/S demonstrated distended gallbladder with sludge, gallbladder wall thickening, and pericholecystic fluid suspicious for evolving cholecystitis.  His LFTs were generally unremarkable: ALT wnl, AST 72, alk phos 274, and bili wnl.  He was transferred to the ICU and started on pressors, SDS, and vancomycin , cefepime , and metronidazole .  General surgery was consulted for cholecystitis and recommended IR consult for percutaneous cholecystostomy tube which patient underwent 9/10.  TTE was technically difficult but noted LVEF 75-80% and no gross valvular abnormalities.  Today, patient  with rising leukocytosis, WBC 25.8, but remains afebrile.  He is now on RA but remains on pressors which had to be increased for low BP this morning.  Biliary cultures from PCT placement growing E faecalis, so vancomycin  transitioned to linezolid  per ICU.  Blood cultures prior to antibiotics remain without growth.  Patient evaluated by nephrology with plan to transition to CRRT in s/o worsening renal function, Cr 7.11, despite iHD.   He is planned to undergo thoracentesis today given worsening hemodynamics and increasing leukocytosis to r/o infection.  He is planned to undergo repeat CT A/P today as well as repeat RUQ U/S.ID consulted due to concerns for septic shock.Patient does not contribute to history which was obtained via chart review.ANTIMICROBIAL HISTORY:Cefazolin  9/6, 9/8 - 9/9Cefepime 9/9 - presentCeftriaxone 9/10Metronidazole 9/9 - presentVancomycin 9/9 - 9/11Linezolid 9/12ANTIBIOTIC ALLERGIES:PCNs - facial swellingPERTINENT MEDICAL, FAMILY & SOCIAL HISTORY:N/AREVIEW OF SYSTEMS: unable to obtainPHYSICAL EXAM:Vitals: BP: (73-116)/(41-75) Temp:  [36.2 C (97.1 F)-37.2 C (99 F)] Temp src: Axillary (09/12 0400)Heart Rate:  [66-109] Resp:  [20-24] SpO2:  [94 %-99 %]  General: critically ill appearingLines: HDTL L chest, TCVC L femoral, L a lineHEENT: NC, O/P clear, conjunctivae and sclera normalNeck: supple, no LADCardiovascular and extremities: tachycardia, no murmurLungs: gurgling and coarse breath sounds noted, tachypneaAbdomen: distended, TTP over periumbilical to suprapubic areaSkin: no rash Ext: RUE edema, R proximal LE surgical dressing dryNeurologic:  wakes to nameLABORATORY, MICROBIOLOGY AND IMAGING DATA REVIEWED & INTERPRETED. NOTABLE FOR:WBC 25.8, ALT <5, AST 35, alk phos 254, tbili 0.2, Cr 7.11, lactate venous 3.4Micro:9/3 COVID/flu/RSV - negative9/8 blood cultures - NGTD9/10 biliary culture - E  faecalisImaging:9/5 MR head without and with contrast:Decreased sensitivity/specificity secondary to significant patient motion.  Overall, interval progression of the intracranial metastatic disease with  new/growing metastatic lesions, increasing leptomeningeal enhancement, and multifocal cranial nerve enhancement.  New subtle FLAIR hyperintensity and diffusion restriction within the splenium of the corpus callosum, probably representing a cytotoxic lesion of the corpus callosum.  Stable patchy signal/enhancement within the calvarium. Findings are nonspecific and could be related to known renal osteodystrophy or metastatic disease. Attention on follow-up imaging is recommended. 9/9 CXR:No change from previous study. Pulmonary edema and small effusions. Left basilar opacity probably representing atelectasis. 9/9 CT chest without contrast:Shifting pleural fluid with interval increase in size of now large left pleural fluid with associated large volume atelectasis of the left lower lobe. Interval decrease in right pleural fluid with small residual loculated fluid.  New groundglass opacities within the right middle and lower lobes concerning for infectious/inflammatory process. There are a few new nodules within the right middle and lower lobes as described above and may be concerning for sequela of pleural nodularity. Recommend follow-up CT chest in 3 months.  Multiple soft tissue nodules within the anterior and posterior chest wall as described above. These are not significantly changed compared to prior exam. Recommend attention on future follow-up.  Stable mediastinal and hilar lymphadenopathy concerning for possible metastatic disease. Consider further evaluation with PET/CT and/or tissue sampling. 9/9 CT A/P without contrast:Severely dilated gallbladder. Correlation with ultrasound could be performed for further assessment if there is concern for cholecystitis.   High density small volume perihepatic and pelvic fluid. This may represent blood products or proteinaceous ascites from other etiology. Correlation for clinical signs of bleeding is recommended.  Borderline dilated right colon, which may represent an ileus.  Small volume intermediate free pelvic fluid, which may represent hemoperitoneum.  Multifocal nodal, soft tissue and intramuscular metastases.  Diffuse osseous metastatic disease.  Polycystic kidney disease. 9/9 US  doppler BLE:No evidence of acute deep venous thrombosis in the visualized vessels. The left common femoral vein and lower portions of the peroneal veins are not visualized.  Chronic changes along the wall of the left popliteal vein. 9/10 RUQ U/S:Distended gallbladder with sludge, gallbladder wall thickening and pericholecystic fluid. This is suspicious for evolving cholecystitis.  Increased renal cortical echogenicity, as can be seen in the setting of medical renal disease.  Multiple renal cysts. 9/10 UUZ:Uzrywprjoob difficult study due to body habitus / poor acoustic windows. Imaging is limited enough such that significant findings could have been missed.Hyperdynamic LVEF without significant wall motion abnormalities. The estimated LVEF is 75-80%.Grossly normal right ventricular size and function.No gross valvular abnormalities.Unable to accurately estimate pulmonary artery systolic pressure. No recent study is available for comparison.9/12 RKM:Pwrmzjdpwh patchy bilateral airspace opacities likely representing pulmonary edema with superimposed infection/inflammation.  Moderate partially loculated left pleural fluid slightly increased from prior. ASSESSMENT:This is a 63 y.o. 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  ID consulted due to concerns for septic shock.  Blood cultures obtained remain without growth after 4 days but could consider repeating due to worsening clinical status.  He is planned  to undergo thora today to drain pleural effusion which may be infectious vs malignant in origin, so would obtain cultures from that.  His hardware is new except for HD line, so unlikely source of infection.  He does not make urine.  His abdomen is distended and TTP on exam, so seems most likely source.  His gallbladder has been drained, and the E faecalis isolated in biliary cultures is pan sensitive with LFTs trending down since PCT placement, so seems less likely cause.  CT A/P obtained today with read pending.  Personally, some of his bowel seems dilated with air fluid levels.  He is not stooling per nurse, so unable to obtain C diff to r/o fulminant C diff, but if he does have BM, would consider sending.  Also, patient has history of ischemic colitis, so that remains a possibility in the s/o patient's chronic hypotension.As patient appears to be worsening clinically while on cefepime , metronidazole , and linezolid , would consider broadening gram negative coverage.RECOMMENDATIONS:-Discontinue cefepime  and metronidazole -Begin meropenem ; continue linezolid -Obtain aerobic, anaerobic, fungal cultures from today's thoracentesis along with pleural fluid and serum protein and LD, pleural fluid cell counts, and cytology-Follow CT A/P read-If patient has BM, can send C diff PCR/EIA-Repeat blood cultures from lines  and peripheryThanks for the consult. We will continue to follow with you.Patient seen with and case/plan discussed with ID attending, Dr. Kelilah Hebard.Lauraine Satterfield PA-CInfectious Diseases APPAttending Addendum:I saw and evaluated the patient, and I provided the substantive portion the medical decision making. I approved the management plan. I take responsibility for the plan and the patient's management. 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 He underwent prophylactic R femur CMN on 11/16/23 and IR embolization of the R humerus 11/18/23 with anticipated reduction which has since been deferred due to septic shock. Following the procedure he became hypotensive, confused, and distended and was transferred to MICU 11/19/23 requiring pressors. Abdominal ultrasound showed severely dilated gallbladder extending past the liver and into the RLQ. He underwent percutaneous cholecystostomy on 11/20/23. Biliary cultures now growing amp-sensitive Enterococcus faecalis. Throughout the day today he has had escalating pressor requirements up to 30 of norepi with the addition of vaso. He continues to be encephalopathic with abdominal distension though CT abdomen/pelvis, apart from decompressed gallbladder and known metastatic disease, shows no significant new pathology. He underwent thoracentesis for the L pleural effusion  today. The cause of his progressive shock is not clear. His gallbladder appears appropriately decompressed. He has been covered for the Enterococcus faecalis isolate from the biliary  cultures. Blood cultures remain negative. His HD line and femoral line do not appear externally infected. The right hip surgical site is non erythematous and non swollen. CXR with effusion but no obvious evidence of pneumonia. I was concerned about the possibility of ischemic colitis, given his history of the same, exam notable for abdominal distension, and what appeared to be dilated air/fluid filled loops of bowel on CT however radiology notes no significant bowel wall thickening or dilation. One additional consideration could be C diff given the abdominal distension, though without wall thickening on CT, colitis is less likely.His antibiotics were broadened  in conversation with the ICU throughout the day in response to his worsening shock. As above, would repeat blood cultures today as it has been 72 hours since his last cultures. Include a peripheral and cultures from the HD line (the fem  line is only 68 days old). Follow up the pleural fluid cultures. Continue linezolid , meropenem , tobramycin , and caspofungin . Send c diff testing if able to collect stool sample. Would consider starting empiric enteric and/or rectal vancomycin  plus IV metronidazole  if he continues to decline. Bettyann Roosevelt, MD 7:53 PM 11/22/2023

## 2023-11-22 NOTE — Procedures (Signed)
 Procedure ReportARTERIAL LINE PROCEDURE NOTETime out documentation completed in Procedure navigator prior to procedure:  YesIndications  hemodynamic monitoring Procedure Details  Successful placement: YesSide: rightArterial Site: radial arteryAttempted Sites: Left radial artery1% lidocaine  S.Q. 1 mL totalArtery cannulated with 22G catheterKit Lot #: from stockTechnique: with wire guide and ultrasoundCorrect placement confirmed by: transducerSecured by: suturing and bioocclusive dressingGuide Wire Removed : yesFindings  pulsatile flowComplications  noneCondition  Patient tolerated procedure well. YesPatient's condition at end of procedure: no change from priorPlan/Orders  OABGEBL  <5ccDisposition  MICUNICHOLAS Falisa Lamora, PA  11/22/2023  10:07 AM

## 2023-11-22 NOTE — Progress Notes (Signed)
 Report Given ToSarah, RN Active Issues / Relevant Events Nursing note [id number]:At start of shift, pt w/ worsening mental status & increasing pressor requirements. A-line placed. Traveled for abd CT. Thoracentesis done at bedside, 0.25 of dilaudid  given for procedure. Persistently hyperkalemic, CRRT started. Upon initiation of CRRT, worsening hypotension, vaso gtt started. NG placed to LWS. Abd US  done at bedside. Mental status worsening throughout shift, providers are aware. 1x PRN dilauded 0.25 given for pain. Perc drain remains in place with bilious output. Please see doc flowsheets and MAR for additional information and interventions.  Reygan Heagle, RN To Do ListLabsRFP:Q8 for 24 hours - 2200, 0600, 1400, then Q 12CBC, Mg dailyABG PRN

## 2023-11-22 NOTE — Progress Notes (Signed)
 Report Given ToJulia and Katie, RNsActive Issues / Relevant Events Assumed care of patient from 1900-0700. Afebrile. Levo titrated for systolics >90. Wife updated at bedside. Travelled for LUE xrays. Temporized K+ of 6.1, K+ recheck at 0710.Please see flowsheets for additional information on patient care, assessments, vital signs, and I&Os.

## 2023-11-22 NOTE — Procedures (Signed)
 Procedure ReportTHORACENTESIS PROCEDURE NOTETime out documentation completed in Procedure navigator prior to procedure:  YesIndications  Pleural effusion, septic shock with unclear sourcceProcedure Details  Consent: Informed consent was obtained. Risks of the procedure were discussed including: infection, bleeding, pain, pneumothorax.Under sterile conditions the patient was positioned. Betadine solution and sterile drapes were utilized.  1% buffered lidocaine  was used to anesthetize the 8th rib space. Fluid was obtained without any difficulties and minimal blood loss.  A dressing was applied to the wound and wound care instructions were provided. Findings  455 ml of serosanginous pleural fluid was obtained. Complications  None; patient tolerated the procedure well.        Condition  stablePlan/Orders  A follow up chest x-ray was ordered and reviewed; no pneumothorax. EBL  MinimalSpecimens  A fluid sample was sent to Pathology for cytogenetics, flow, and cell counts, as well as for infection analysis.Disposition  UnchangedKeyan Dontrey Snellgrove, MD  11/22/2023  9:33 PM

## 2023-11-22 NOTE — Progress Notes (Signed)
 Nephrology Attending Pt seen and examinedSeptic on much increase dose of levoAppears confusedSig other at bedsideDiscussed septic  state and need for RRTBile growing enterococcus- likely source of sepsisOrder fro NC CRRT placed Objective  Current Vitals Vitals Range (24 Hours) BP 106/64 (BP Location: Left arm)   Pulse 66   Temp 36.2 C (97.1 F) (Axillary)   Resp 20   Ht 1.753 m (5' 9)   Wt 77.6 kg (171 lb)   SpO2 97%   BMI 25.25 kg/m  BP: (73-116)/(41-75) Temp:  [36.2 C (97.1 F)-37.2 C (99 F)] Temp src: Axillary (09/12 0400)Heart Rate:  [66-109] Resp:  [20-24] SpO2:  [94 %-99 %]  I/Os Weight I/O last 3 completed shifts:09/11 0700 - 09/12 0659In: 1437.7 (18.5 mL/kg) [I.V.:619.5 (0.3 mL/kg/hr); NG/GT:240; IV Piggyback:578.2]Out: 625 (8.1 mL/kg) [Emesis/NG output:625]Net: 812.7Weight: 77.6 kg  Last 4 Weights  11/20/23 1100 Weight: 77.6 kg (171 lb)  Physical Exam:Constitutional:General: Not in acute distress, laying in bedAppearance: ill-appearingHENT:Eyes: Anicteric scleraeHead: Normocephalic and atraumaticCVS: Rate and Rhythm: Normal rate and rhythmEdema:  peripheral edema bilaterallyPulmonary: Effort: No respiratory distressBreath sounds: CTAB anteriorly GI: distendedAccess: HDTC, dressing CDIRecent Labs Lab 09/12/250847 09/12/250749 09/11/252349 09/10/252322 09/09/252352 09/09/251237 09/08/252324 Sodium  --  135 135 134 133 136 131* Potassium 5.8* CANCELED 6.1* 5.3* 5.2* 4.9 5.2* Chloride  --  95* 95* 95* 94* 94* 91* CO2  --  15* 14* 17* 23 19* 20 Anion Gap  --  25* 26* 22* 16 23* 20* UN  --  82* 74* 57* 41* 35* 55* Creatinine  --  7.11* 6.84* 6.06* 5.21* 4.72* 6.80* Glucose  --  82 97 92 84 68 97 Calcium   --  8.3* 8.2* 8.4* 8.7 8.1* 8.4* Albumin  --   --   --   --   --  3.0* 3.1* Phosphorus  --   --  6.1* 5.9* 5.2* 4.5 5.9* Magnesium   --   --  2.2 2.1 1.9 1.8 1.9  No results found for: BKVQLNo results found for: CMVQLAerobic bacterial pleural or thoracentesis fluid culture and stain Date Value Ref Range Status 10/01/2023 Lab Cancel  Final Aerobic bacterial wound or drainage culture and stain Date Value Ref Range Status 11/20/2023 Enterococcus faecalis (!)  Preliminary   Comment:   4+ Susceptibilities to followIsolate identified by MALDI-TOF Anaerobic bacterial wound or drainage culture Date Value Ref Range Status 11/20/2023 .  Preliminary Bacterial Blood Culture Date Value Ref Range Status 11/19/2023 .  Preliminary 11/19/2023 .  Preliminary  Recent Labs Lab 09/11/252349 09/10/252322 09/10/250205 WBC 25.8* 21.7* 21.6* Hemoglobin 9.7* 9.4* 10.1* Hematocrit 33* 31* 34* Platelets 675* 700* 729*   Component Value Date/Time  FE 65 10/17/2023 1644  IBC 176 (L) 10/17/2023 1644  FESAT 37 10/17/2023 1644  FER 881 (H) 10/17/2023 1644 No results for input(s): TACRR, SIR, CSAM in the last 168 hours. No results for input(s): JOLINDA PLEVA, [place], [place], [place], [place], UOSMO in the last 168 hours.No results for input(s): UAPP, UCOL, UAGLU, KETONESU, USG, UBLD, UAPH, UPRO, UNITR, ULEU, URBC, UWBC, UMUC, [place], [place], SPECIFICGRAV, BLOODUA, PHUA, PROTEIN, NITRITEUA, LEUKESTERASE, GLUCOSESIE, KETONESIEM, PUSG, BLOODSIEMEN, PUAPH, PROTEINUA, PUNIT, LEUKOCYTESIE in the last 8760 hours. * Humerus LEFT standard AP and  Lateral viewsResult Date: 9/12/2025No suspicious osteolytic lesion of the humerus is perceived. No pathologic fracture. Increased bone mineral density possibly related to renal osteodystrophy. END OF IMPRESSION IR percutaneous cholecystostomy drainage and catheter placementResult Date: 9/10/2025Insertion of cholecystostomy tube with drainage of dark bile. Plan: Routine exchange in  approximately 6 weeks. END OF  IMPRESSION I was present for the critical and key portions of the procedure or visit and was immediately available in the suite to assist. I have personally reviewed the images and the Resident's/Fellow's interpretation and agree with or edited the findings. CT chest without contrastResult Date: 9/10/2025Shifting pleural fluid with interval increase in size of now large left pleural fluid with associated large volume atelectasis of the left lower lobe. Interval decrease in right pleural fluid with small residual loculated fluid. New groundglass opacities within the right middle and lower lobes concerning for infectious/inflammatory process. There are a few new nodules within the right middle and lower lobes as described above and may be concerning for sequela of pleural nodularity. Recommend follow-up CT chest in 3 months. Multiple soft tissue nodules within the anterior and posterior chest wall as described above. These are not significantly changed compared to prior exam. Recommend attention on future follow-up. Stable mediastinal and hilar lymphadenopathy concerning for possible metastatic disease. Consider further evaluation with PET/CT and/or tissue sampling. Please see separately dictated CT abdomen and pelvis imaging report for detailed findings below the level of the diaphragm. END OF IMPRESSION I have personally reviewed the images and the Resident's/Fellow's interpretation and agree with or edited the findings. Portable US  doppler vein bilateral lower extremitiesResult Date: 9/10/2025No evidence of acute deep venous thrombosis in the visualized vessels. The left common femoral vein and lower portions of the peroneal veins are not visualized. Chronic changes along the wall of the left popliteal vein. END OF IMPRESSION Portable US  abdomen limited single quadResult Date: 9/10/2025Distended gallbladder with sludge, gallbladder wall thickening and  pericholecystic fluid. This is suspicious for evolving cholecystitis. Increased renal cortical echogenicity, as can be seen in the setting of medical renal disease. Multiple renal cysts. END OF IMPRESSION. CT abdomen and pelvis without contrastResult Date: 9/9/2025Severely dilated gallbladder. Correlation with ultrasound could be performed for further assessment if there is concern for cholecystitis. High density small volume perihepatic and pelvic fluid. This may represent blood products or proteinaceous ascites from other etiology. Correlation for clinical signs of bleeding is recommended. Borderline dilated right colon, which may represent an ileus. Small volume intermediate free pelvic fluid, which may represent hemoperitoneum. Multifocal nodal, soft tissue and intramuscular metastases. Diffuse osseous metastatic disease. Polycystic kidney disease. Large left pleural effusion which is high density, see separately dictated chest CT for additional details. END OF IMPRESSION *Chest STANDARD single viewResult Date: 9/9/2025No change from previous study. Pulmonary edema and small effusions. Left basilar opacity probably representing atelectasis. END OF IMPRESSION  Current MedicationsScheduled Meds: linezolid   600 mg Intravenous Q12H  ceFEPime  (MAXIPIME ) IV  2,000 mg Intravenous Q12H  metroNIDAZOLE   500 mg Intravenous Q8H  hydrocortisone  IV  50 mg Intravenous Q6H  enoxaparin   30 mg Subcutaneous Daily @ 2100  polyethylene glycol  17 g Oral 2 times per day  senna  2 tablet Oral BID  atorvastatin   20 mg Oral Daily with dinner  B complex-vitamin C-folic acid   1 tablet Oral Daily  cinacalcet   60 mg Oral QPM  fludrocortisone   100 mcg Oral TID  fluticasone   1 spray Nasal Daily  magnesium  oxide  400 mg Oral Daily  midodrine   10 mg Oral TID  pantoprazole   40 mg Oral QAM  acetaminophen   1,000 mg Oral TID Continuous Infusions: vasopressin  Stopped (11/22/23 0941)   PrismaSATE BK  0/3.5    PrismaSOL  BGK 4/2.5    PrismaSOL  BGK 4/2.5    NORepinephrine  20 mcg/min (11/22/23 0759) PRN Meds:oxyCODONE  **OR** oxyCODONE , DiphenhydrAMINE -Lidocaine -Maalox (BMX/First  Mouthwash), melatonin, heparin  (porcine), heparin  (porcine), sodium chloride , dextrose , naloxone   AssessmentGreg Pearson is a 63 y.o. 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 Ortho being consulted. Nephrology consulted for ESRD and provision of dialysis. PlanESRD on YI:Tpoo start NC cRRT thru left iJ perm HD cathDialysis access: -HDTCElectrolytes:-Most recent potassium level 58 temporize and notify nephrology for worsening hyperkalemia refractory to medical management.-Na 133CKD-BMD:-Ca 8.7-Phos 5.2-Continue cinacalcet  60 mgHemodynamics: HTN/Hypervolemia-Hypotensive, on norepinephrine .-Minimal o2 requirements.-Will remove fluid with HD as hemodynamics allow.Anemia: Hemoglobin goal (chronically) 10-11.5 gm/d-Hgb 10.1-No indication for ESA at this time.

## 2023-11-22 NOTE — Plan of Care (Signed)
 Problem: Safety  Goal: Patient will remain free of falls  Outcome: Maintaining  Goal: Prevent any intentional injury  Outcome: Maintaining     Problem: Pain/Comfort  Goal: Patient's pain or discomfort is manageable  Outcome: Maintaining     Problem: Mobility  Goal: Patient's functional status is maintained or improved  Outcome: Maintaining     Problem: Cognitive function  Goal: Cognitive function will be maintained or return to baseline  Description: Interventions:  Delirium Assessment  LIVEBAR Assessment    Outcome: Maintaining

## 2023-11-22 NOTE — Progress Notes (Signed)
 Respiratory TherapyWeekly Summary NoteDate: 11/22/2023                         Time: 8:57 PMInitial AssessmentSubjective Pt on NCObjective Patient Vitals for the past 12 hrs: Temp Temp src Pulse Resp SpO2 11/22/23 2000 36.2 C (97.2 F) Axillary 70 20 98 % 11/22/23 1900 -- -- 71 20 98 % 11/22/23 1800 -- -- 73 13 97 % 11/22/23 1700 -- -- 75 18 97 % 11/22/23 1600 36.1 C (97 F) Axillary 64 17 98 % 11/22/23 1500 -- -- 90 13 97 % 11/22/23 1400 -- -- 77 13 100 % 11/22/23 1300 -- -- 66 16 93 % 11/22/23 1200 36 C (96.8 F) Axillary 67 (!) 26 97 % 11/22/23 1000 -- -- 66 20 97 % 11/22/23 0900 -- -- 66 20 97 % Lung Sounds: Respiratory Pattern: TachypneicBilateral Breath Sounds: Clear;Diminished RESPIRATORY / METABOLIC / BLOOD LEVELS:Respiratory/MetabolicpH, ABG: (!) 7.26pCO2, ABG (mmHG): 36 mmHGpO2, ABG (mmHG): 80 mmHGSaO2, ABG (%): 91 %pH, VBG: (!) 7.27pCO2, VBG (mmHG): (!) 37 mmHGpO2, VBG (mmHG): 39 mmHGSvO2, VBG (%): (!) 57 %Lactate: (!) 2.1 mmHGBlood LevelsHemoglobin: (!) 9.7Platelets: (!) 675Albumin: (!) 3                    Ventilator Settings:    Other Relevant Findings:NoneAssessmentPt here for Stage 4 Carcinoma-comfortable on NC at this timePlanFollow md Graciela CHRISTELLA Nobles, RT 8:57 PM 11/22/2023

## 2023-11-22 NOTE — Progress Notes (Signed)
 Medical ICU Progress Note LOS: 9 Days Interval History/Subjective: - Saw patient at bedside. He is more altered today, looks ill. Participation limited in conversation/exam.- Potassium increased to 6.1 last night, s/p calcium  gluconate - Levo req increased - Updated wife at bedside Objective Vital Signs: BP 97/66 (BP Location: Left arm)   Pulse 72   Temp 36.2 C (97.1 F) (Axillary)   Resp 20   Ht 1.753 m (5' 9)   Wt 77.6 kg (171 lb)   SpO2 96%   BMI 25.25 kg/m  Physical ExamPhysical Exam General: Ill appearing, not in acute distress. Doesn't engage in conversation well  Neuro: n/a HEENT: dry mucous membranes  Pulm: Rales to left side, otherwise moving air well throughout anteriorly  Cardiac: Normal R&R. No murmurs.  GI: Abd distension on exam, no guarding on palpation. Bowel sounds hypoactive but present.  GU: deferred  Extremities: no LE pitting edema or calf swelling bilaterally  Integument: warm, dry   Assessment: Shane Pearson is a 63 y.o. 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 Plan was for patient to have internal fixation surgery but was complicated with hypotension and presser support leading admission to ICU. Hypotension likely due to septic shock with gallbladder/bile source. Plan: Pulmonary: Hypoxic respiratory failure, likely mixed OSA/OHS vs volume overload - Repeat CXR showing: increase patchy bilateral airspace opacities likely representing pulmonary edema/infection. Moderate partially loculated left pleural fluid slightly increased from prior. - Not on any supplemental O2- Plans for diagnostic thoracentesis today to rule out infection due to elevation of WBC count, more altered, increased presser  requirement - Recent VBG: ph 7.33, PCO2 34 , bicarb 18- Chest xray 9/9: No change from previous study. Pulmonary edema and small effusions. Left basilar opacity probably representing atelectasis. - CT chest 9/9 showing increase in size of large left pleural fluid with decrease in right pleural fluid. New ground glass opacities within right middle and lower lobes. Pleural effusion likely malignant but cannot exclude infectious source. Consider thoracentesis due to increase in new O2 requirement from baseline. Drainage on 07/22 showed cloudy/yellow with malignant tumor cells present. LD 547, Pleural Protein 4.2. Total Protein 6.4. Exudate/Culture negative. No glucose obtained at that time.  - MRSA nares obtained, negative - Sputum cultures not obtained- ARDS? NoCardiovascular: Shock, likely septic, HFpEF, longstanding hypotension   - Currently on Levophed , MAP goal 60- Vasopressin  added due to increase requirements of levo up to 25 - Hydrocortisone  50 mg IV q6h - s/p 1.5 L IVF, caution due to heart failure/ESRD. Pressures with no response post fluids. - EKG showing sinus rhythm with prolonged Qct at 563 - Echo 2016: mild diastolic dysfunction - New echo done 9/10 was limited/difficult study but showed hyperdynamic LVEF without significant wall motion abnormalities. LVEF 75-80%- Pro BNP: 2,053. On admission was at 2700 - Lactate 1.9 - Continue home Atorvastatin  20 mg - Continue home asa 81 mg daily - On midodrine  10 TID at home ID: No known infectious source, considerations of intra-abdominal due to GB distention, but reassuring RUQ panel, could be secondary to pneumonia     - Discontinue Cefepime /Flagyl  and vanc- Will start on linezolid  and meropenem  - ID on board  - Aerobic cultures off perc cholecystomy tube + Enterococcus faecalis, vancomycin  sensitive - Micro/Culture data: Blood cultures NGTD.  Collected on 11/18/2023. Repeat blood cultures  collected from HD  cath. Remain NGTD.- Anaerobic cultures off perc cholecystomy tube with NGTD. - No urine cultures obtained, anuric @ baseline - CT imaging w/ abd w/ severely dilated gallbladder, high density small volume perihepatic/ascitic fluid.- CT chest w/ shifting pleural fluid, large left pleural effusion.- De-escalate antimicrobials?: NoRenal:  ESRD T/Th/S HD. Anuric @ baseline.   - Last HD on 09/09. No indications for dialysis yesterday.- Potassium increased to 6.1 overnight, creatine up trending - s/p calcium  gluconate last night - Repeat BMP in morning with potassium showing 5.8 - Will plan for CRRT today - Baseline sCr: 4.0-7.0  - Indwelling urinary catheter?: No GI: c/f ileus. Distended gallbladder although no clinical evidence of cholecystitis.     - NG was clamped yesterday, patient ate ice chips and had some sips of water . - NG open today, low/intermittent suction r- NPO due to increase in presser req and altered mental status- STAT CT abd/pelvis and STAT RUQ ultrasound ordered due to continuous abd distension and worsening white count with inc presser req - Enteral Nutrition: No - GI prophylaxis: Yes; Protonix   - Last BM: this AM - Bowel Regimen: Senna/ miralax   - IR percutaneous cholecystostomy tube placed 9/10 - PCT should stay to gravity drainaige x2 weeks then should contact EGS service at that time for consideration of a cap trial.Heme: Chronic anemia   - Anticoagulation? Yes; Lovenox  30 mg SQ - No DVT's on baseline ultrasound - Schistocytes, spiculated, and target cells present on diff. 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    -On Florinef  for hypotension.- Initiated on Hydrocort  50 mg q6h - Hold dexamethasone  while on stress dose steroids - BG 60-130s during hospital course.- Cinacalcet  for hyperparathyroidism- Random cortisol 10.5 overnight Neuro/Psych:  Pathologic R humeral fracture   - Ortho initially  planning OR for IM fixation but was delayed due to transfer to MICU for hypotension. 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 - Continue immobilization and pain control with tylenol  and PRN oxy (5-10mg ) - Follow up with neuro oncology regarding leptomeningeal metastasis - Left humerus/forearm xrays normal with no concerns for fx/lesions - In regards to patient's metastatic cancer, tumor board meeting's plan will likely be WBRT and CNS penetrating chemo, both when safe/able. med-onc, rad-onc, neuro-onc all aware.Social: - HCPJeriel, [place], [phone]  - Last provider update: Updated MOSLT form yesterday to DNR/DNI.  Spoke to wife at bedside this morning. Updated on the following: increase in BP support needed, altered likely due to worsening infection, change of antibiotics, thoracentesis, CRRT, repeat imaging of abdomen. Wife verbalized agreement and understanding. - GOC addressed this ICU admission? Yes; DNR, non-invasive ventilation trial ok (bipap), if this fails DNI  Integument: : Standard care - WOCN rec's up-to-date?: Yes Lines/Tubes/Drains: CVC tunneled left femoral, cholecystomy tube, NG tube, A line Frequency of lab draws: Daily  Medication Reconciliation Completed: Yes Code Status: DNR/DNI Plan of care discussed with: HCP/patient Diagnoses for this hospitalization include: Sepsis Dispo: Pending clinical improvement Burnard Fey, PA Pulmonary/Critical Care MedicineI spent 35 cumulative minutes, independent of procedures, in attention to this critically ill patient who has 1 or more organ system failure and a high probability of imminent or life-threatening deterioration.   Fey Burnard, PA09/12/25 1404

## 2023-11-22 NOTE — Plan of Care (Signed)
 General Surgery Brief NoteReengaged by the MICU as patient has remained hypotensive and requiring 2 pressors despite having his percutaneous cholecystostomy tube placed.  He underwent repeat CT imaging today that demonstrates decompression of the gallbladder and he continues to have percutaneous cholecystostomy tube output.  There are no other acute intra-abdominal findings on the CT. discussed with the patient and patient's family members at the bedside that it appears the percutaneous cholecystostomy tube is functioning however there is not a clear source of his persistent pressor requirement.  At this time it does not appear to be a surgical problem.  General surgery will sign off.  Please reengage 2 weeks after percutaneous cholecystostomy tube placement to guide next steps.Deward FABIENE Doheny, MDR5, General Surgery10:54 PM

## 2023-11-22 NOTE — Progress Notes (Signed)
 Pharmacy Progress Note: Renal Dose AdjustmentThis patient is eligible for automatic renal dose adjustment by a pharmacist pursuant to the [facility] Standard Operating Procedure, "Adult Renal Dose Adjustment of Medications by Pharmacists" Current Assessment of Renal Function:   Lab results: 09/12/250749 09/11/252349 09/10/252322 Creatinine 7.11* 6.84* 6.06* UN 82* 74* 57* Renal Assessment:  End Stage Renal DiseaseAdditional Details:  CRRTMedication Changes:Based on the current assessment of renal function, weight and indication the following medications have been renally dose adjusted to the dose and frequency listed below:Cefepime :   Indication:  Intra-Abdominal Infection  Dose:  2 g  Frequency:  Q12hThe pharmacist will continue to monitor clinical course and changes in renal function and will perform additional dose adjustments, if needed. For questions or further discussion, please contact the pharmacist at extension [phone].  Shauntay Brunelli C Devion Chriscoe, PharmD

## 2023-11-22 NOTE — Plan of Care (Signed)
 General Neurology Brief Plan of Care NoteCase discussed at tumor board. Tentative plan would be for WBRT and CNS penetrating chemotherapy when safe/able, as patient is not a candidate for CSI. Case discussed with neuro-oncology, medical-oncology, and radiation-oncology.- Neurology will sign off at this time. Please don't hesitate to reach back out with further questions.Zachary Cancer, MDNeurology, PGY-3

## 2023-11-22 NOTE — Progress Notes (Signed)
 Brief History: Shane Pearson is a 63 y.o. male with metastatic RCC who recently completed a course of radiation therapy with our team. Below is his treatment summary. Oncology History:Metastatic carcinoma Hx CHF, ESRD on HD, OAMr Shane Pearson presented to the hospital on 09/28/23 with shooting L shoulder pain x 8 weeks, inability to ambulate x1 week 2/2 R leg pain, myalgias, anorexia, fatigue, unintentional weight loss, night sweats x 6 months.  CT C-spine showed an asymmetric patchy lucency in C7 extending to the transverse process. MR showed an expansile enhancing lesion in C7 vertebral body with additional soft tissue components. This is suspicious for metastatic disease. Further imaging obtained included CT abdomen pelvis, CT chest, MR L spine, and MR T spine which showed diffuse metastatic disease. CT head was without evidence of intracranial disease. Radiation oncology was consulted to discuss possible palliative RT to the C7 lesion.  He reports initially experiencing R shoulder pain 6 months ago that resolved. He then developed L shoulder pain about 8 weeks ago. He reports that the pain is intermittent, positional, severe, and shoots down to his hand. He also endorses some weakness in the arm for the past week. He states that he has been able to use that arm less due to the pain, but since being in the hospital he has been trying to use it more and has noted some improvement in functionality and pain. He reports that none of the medications he has been on have helped the pain7/25/25: Consult with Dr.Milano: Discussed RT options, reco'd RT to C-spine x 5 fractions8/25/25 - 11/13/23: Shane Pearson completed a course of C-spine RT: 20gy over 5 fractionsRadiation Oncologist: Dr. Ronal LiuElapsed Days: 10Treatment dates: 11/04/23 - 9/3/25Treatment Machine: True BeamTreatment Technique: Patient underwent CT simulation to delineate the target volume and normal surrounding structures.  The patient was placed in a supine position with a custom molded head/neck mask for immobilization. A 3D planning technique was utilized and [facility] was used to spare surrounding anatomical structures, including the esophagus. Treatment Course: Tolerated well with anticipated mild fatigue but he did not require any treatment breaks. Of note, he did run out of Decadron  during his treatment course and was unable to lift his left arm so he was restarted on Decadron  to hopefully help. Disposition: Follow up in 3 months.

## 2023-11-22 NOTE — Progress Notes (Signed)
 Critical Care Attending Physician noteSYNOPSIS OF OUR ASSESSMENT AND PLANS:Septic shock - on levophed , stress dose steroidsIncreased levophed  for low BP this am, now weaning back, central line placedRespiratory failure - improved, now on RAMalignant L effusion, given worsening hemodynamics and increasing WBC, will tap for diagnostic purposes to r/o infectionCholecystitis - evaluated by EGS, IR placed a percutaneous cholecystostomy tubeCultures positive for E. faecalisCefipime, linezolid , vancWill image abd and RUQ to make sure there is no additional fluid collectionMetastatic RCC with malignant pleural effusionSpinal and intracranial metsAdrenal insufficiency - on stress dose steroids; fluricort, hydrocortESRD - planning for CRRTGI - NPOPathologic humeral fracture - pending fixationProphylaxis - on lovenoxDNR/DNIIf there are key historical elements or objective findings that I think deserve particular emphasis, I have recorded them below.  A summary of key elements of our assessment and plans is listed above.  Please refer to APP's note for complete details of our mutually agreed upon findings, assessment, and recommendations.63 y.o. 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 He had undergone pre-emptive IR coil embolization in anticipation of pathologic humeral fracture reduction on 9/9, but developed hypotension and transferred to MICU.Objective findings include: exam unchanged despite increase in lovenoxAwake, oriented, responsiveHeart RRLungs equal air entry anteriorly, rales in LLL fieldAbd softExt no edema_______________________________________________________________________________This patient was evaluated on  rounds with the multidisciplinary ICU team.  I personally examined the patient.  All nursing documentation, laboratory data, test results, and radiographs were reviewed and interpreted by me.  I have established the management plan for this patient's critical illness and have been immediately available to assist with patient care.  I agree with the database, findings, and plan of care recorded in the resident physician note.This patient is critically ill with at least 1 organ system failure associated with a high probability of imminent or life threatening deterioration.  The care I have delivered involves high complexity decision making to assess, manipulate, and support vital system function(s), to treat the vital organ system failure and/or prevent further life threatening deterioration of the patient's condition.  critical care time: I spent 50 minutes personally attending to this patient's critical care needs.  This critical care time is exclusive of any time for separately billable procedures.  My documented total critical care time reflects my own patient care time and does not include teaching or procedure time.

## 2023-11-23 ENCOUNTER — Inpatient Hospital Stay

## 2023-11-23 DIAGNOSIS — Z9889 Other specified postprocedural states: Secondary | ICD-10-CM

## 2023-11-23 DIAGNOSIS — D72829 Elevated white blood cell count, unspecified: Secondary | ICD-10-CM

## 2023-11-23 LAB — ART GASES / WHOLE BLOOD PANEL
Base Excess, Arterial: -11 mmol/L — ABNORMAL LOW (ref <=2)
Base Excess, Arterial: -8 mmol/L — ABNORMAL LOW (ref <=2)
CO2,ART (Calc): 17 mmol/L — ABNORMAL LOW (ref 23–28)
CO2,ART (Calc): 18 mmol/L — ABNORMAL LOW (ref 23–28)
CO: 0.7 %
CO: 1 %
Chloride,WB: 99 mmol/L (ref 96–108)
Chloride,WB: 100 mmol/L (ref 96–108)
FO2 Hb, Arterial: 90 % (ref 90–100)
FO2 Hb, Arterial: 87 % — ABNORMAL LOW (ref 90–100)
Glucose,WB: 136 mg/dL — ABNORMAL HIGH (ref 60–99)
Glucose,WB: 96 mg/dL (ref 60–99)
HCO3, Arterial: 16 mmol/L — ABNORMAL LOW (ref 21–26)
HCO3, Arterial: 17 mmol/L — ABNORMAL LOW (ref 21–26)
Hemoglobin: 11.5 g/dL — ABNORMAL LOW (ref 12.0–17.0)
Hemoglobin: 11 g/dL — ABNORMAL LOW (ref 12.0–17.0)
ICA @7.4,WB: 4.6 mg/dL — ABNORMAL LOW (ref 4.8–5.2)
ICA @7.4,WB: 4.7 mg/dL — ABNORMAL LOW (ref 4.8–5.2)
ICA Uncorr,WB: 4.9 mg/dL (ref 4.8–5.2)
ICA Uncorr,WB: 4.8 mg/dL (ref 4.8–5.2)
Lactate ART,WB: 3.3 mmol/L — ABNORMAL HIGH (ref 0.3–0.8)
Lactate ART,WB: 2.4 mmol/L — ABNORMAL HIGH (ref 0.3–0.8)
Methemoglobin: 2.4 % — ABNORMAL HIGH (ref 0.0–1.0)
Methemoglobin: 2.4 % — ABNORMAL HIGH (ref 0.0–1.0)
NA, WB: 131 mmol/L — ABNORMAL LOW (ref 135–145)
NA, WB: 132 mmol/L — ABNORMAL LOW (ref 135–145)
Potassium,WB: 4.8 mmol/L — ABNORMAL HIGH (ref 3.3–4.6)
Potassium,WB: 4.2 mmol/L (ref 3.3–4.6)
pCO2, Arterial: 35 mmHg (ref 35–46)
pCO2, Arterial: 32 mmHg — ABNORMAL LOW (ref 35–46)
pH: 7.26 — ABNORMAL LOW (ref 7.35–7.45)
pH: 7.34 — ABNORMAL LOW (ref 7.35–7.45)
pO2,Arterial: 75 mmHg — ABNORMAL LOW (ref 80–100)
pO2,Arterial: 61 mmHg — ABNORMAL LOW (ref 80–100)

## 2023-11-23 LAB — RENAL FUNCTION PANEL
Albumin: 3.1 g/dL — ABNORMAL LOW (ref 3.5–5.2)
Albumin: 3 g/dL — ABNORMAL LOW (ref 3.5–5.2)
Anion Gap: 20 — ABNORMAL HIGH (ref 7–16)
Anion Gap: 23 — ABNORMAL HIGH (ref 7–16)
CO2: 19 mmol/L — ABNORMAL LOW (ref 20–28)
CO2: 16 mmol/L — ABNORMAL LOW (ref 20–28)
Calcium: 9.2 mg/dL (ref 8.6–10.2)
Calcium: 9 mg/dL (ref 8.6–10.2)
Chloride: 96 mmol/L (ref 96–108)
Chloride: 97 mmol/L (ref 96–108)
Creatinine: 4.43 mg/dL — ABNORMAL HIGH (ref 0.67–1.17)
Creatinine: 3.69 mg/dL — ABNORMAL HIGH (ref 0.67–1.17)
Glucose: 130 mg/dL — ABNORMAL HIGH (ref 60–99)
Glucose: 92 mg/dL (ref 60–99)
Lab: 57 mg/dL — ABNORMAL HIGH (ref 6–20)
Lab: 47 mg/dL — ABNORMAL HIGH (ref 6–20)
Phosphorus: 4.9 mg/dL — ABNORMAL HIGH (ref 2.7–4.5)
Phosphorus: 4.4 mg/dL (ref 2.7–4.5)
Potassium: 5 mmol/L (ref 3.3–5.1)
Potassium: 4.6 mmol/L (ref 3.3–5.1)
Sodium: 135 mmol/L (ref 133–145)
Sodium: 136 mmol/L (ref 133–145)
eGFR BY CREAT: 14 — AB
eGFR BY CREAT: 18 — AB

## 2023-11-23 LAB — CBC AND DIFFERENTIAL
Baso # K/uL: 0 THOU/uL (ref 0.0–0.2)
Eos # K/uL: 0 THOU/uL (ref 0.0–0.5)
Hematocrit: 35 % — ABNORMAL LOW (ref 37–52)
Hemoglobin: 10.5 g/dL — ABNORMAL LOW (ref 12.0–17.0)
Lymph # K/uL: 0.4 THOU/uL — ABNORMAL LOW (ref 1.0–5.0)
MCV: 89 fL (ref 75–100)
Mono # K/uL: 2.1 THOU/uL — ABNORMAL HIGH (ref 0.1–1.0)
Neut # K/uL: 29.6 THOU/uL — ABNORMAL HIGH (ref 1.5–6.5)
Nucl RBC # K/uL: 3.5 THOU/uL — ABNORMAL HIGH (ref 0.0–0.1)
Nucl RBC %: 10 /100{WBCs} — ABNORMAL HIGH (ref 0.0–0.2)
Platelets: 688 THOU/uL — ABNORMAL HIGH (ref 150–450)
RBC: 3.9 MIL/uL — ABNORMAL LOW (ref 4.0–6.0)
RDW: 23.9 % — ABNORMAL HIGH (ref 0.0–15.0)
Seg Neut %: 82.3 %
WBC: 35.2 THOU/uL — ABNORMAL HIGH (ref 3.5–11.0)

## 2023-11-23 LAB — BASIC METABOLIC PANEL
Anion Gap: 23 — ABNORMAL HIGH (ref 7–16)
CO2: 15 mmol/L — ABNORMAL LOW (ref 20–28)
Calcium: 8.8 mg/dL (ref 8.6–10.2)
Chloride: 96 mmol/L (ref 96–108)
Creatinine: 4.9 mg/dL — ABNORMAL HIGH (ref 0.67–1.17)
Glucose: 139 mg/dL — ABNORMAL HIGH (ref 60–99)
Lab: 60 mg/dL — ABNORMAL HIGH (ref 6–20)
Sodium: 134 mmol/L (ref 133–145)
eGFR BY CREAT: 12 — AB

## 2023-11-23 LAB — DIFF MANUAL
Bands %: 2 % (ref 0–10)
Diff Based On: 119 {cells}
Metamyelocyte %: 4 % — ABNORMAL HIGH (ref 0–1)
Myelocyte %: 3 % — ABNORMAL HIGH
Promyelocyte %: 2 % — ABNORMAL HIGH

## 2023-11-23 LAB — MAGNESIUM
Magnesium: 2.2 mg/dL (ref 1.6–2.5)
Magnesium: 2.4 mg/dL (ref 1.6–2.5)

## 2023-11-23 LAB — FUNGAL STAIN: Fungal Stain: 0

## 2023-11-23 LAB — AFB STAIN: AFB Stain: 0

## 2023-11-23 LAB — C DIFF TOXIN B NAAT (PCR)-TOXIN EIA IF POSITIVE: C diff toxin B NAAT (PCR) - Toxin EIA if positive: NEGATIVE

## 2023-11-23 LAB — PHOSPHORUS: Phosphorus: 5.2 mg/dL — ABNORMAL HIGH (ref 2.7–4.5)

## 2023-11-23 NOTE — Progress Notes (Signed)
 Medical ICU Progress Note LOS: 10 Days Interval History/Subjective: - thoracentesis for effusion completed without retained drain, exudative without PMNsObjective Vital Signs: BP 106/64 (BP Location: Left arm)   Pulse 51   Temp 36.1 C (97 F) (Axillary)   Resp 14   Ht 1.753 m (5' 9)   Wt 77.6 kg (171 lb)   SpO2 93%   BMI 25.25 kg/m  Physical ExamPhysical Exam General: in NADNeuro: lethargic, directs gaze, did not follow commands HEENT: NG, NC in place Pulm: lung coarse Cardiac: s1s2, brady GI: abdomen soft, NT/ND GU: aneuric Extremities: wwp Integument: wwp  Assessment: Shane Pearson is a 63 y.o. male with PMH significant for stage IV carcinoma (likely RCC) with intracranial and spinal mets, receiving radiation therapy, ESRD on HD tues/thurs/Saturday, HFpEF, who presented to [facility] 9/3 from urgent care after mechanical fall resulting in right humerus fracture.He had intermittent hypotension for several days, ultimately developing septic shock, necessitating ICU admission 9/9.  Source of sepsis appeared to be biliary s/p perc cholecystostomy tube 9/10 with Enterococcus however blood cultures were not positive and he remains on dual vasopressors after drain placement giving concern for another process as subsequent imaging shows gallbladder is adequately drained by perc drain.He is DNR/DNI.Plan: Pulmonary: acute pulmonary insufficiency, malignant effusion- Nasal cannula currently on 4 liters, wean for SpO2 92%, oxygen requirement new as of 9/12 afternoon- thoracentesis 7/22 malignant, repeated 9/12 with ongoing sepsis and still exudative with only 1-10 PMNs and no growth- repeat CXR to evaluate parenchyma after fluid drainage- Consider CTA for PE today, will get BLE dopplersCardiovascular: septic shock, chart hx of HFpEF not supported by studies at [facility] or in care everywhere, he does have chronic hypertension and follows with [facility]  [facility] without cause identifiedEcho 9/10 hyperdynamic , adequate response stress test in july- Continue home Atorvastatin  20 mg - Continue home asa 81 mg daily - On midodrine  10 TID at home- NE at 32, vaso at 0.04 with goal SBP 90ID: septic shock, enterococcal cholecystitis s/p C-tube, continued vasopressor requirement after tube placement envoking other source for shock- appreciate ID input, abx broadened 9/12 from cefepime /flagyl /linezolid  to meropenem /linezolid , caspo- C-diff ordered- BC 9/8, 9/9, 9/12 NGTD- IR cultures of GB 9/10 with pan sensitive E-faecalis Renal: ESRD T/Th/S HD. Anuric @ baseline - CRRT without fluid removalGI: hx of ischemic colitis (care everywhere), cholecystitis s/p C-tube placement- NPO for AMS - GI prophylaxis: Yes; PPI - Last BM:  9/13Heme: metastatic RCC, chronic anemic-  prophylactic enoxEndo: Adrenal Insufficiency on Florinef  & Dexamethasone .    -On Florinef  for hypotension.- Initiated on Hydrocort  50 mg q6h - Hold dexamethasone  while on stress dose steroids MSK: right humeral fracture, diffuse spine mets- immobilized for now, further ortho assessment/planning after acute medical stablizationNeuro/Psych: hypoactive deliriumSocial: - HCP/SurrogateNavin, Shane Pearson, Shane Pearson - Last provider update: 9/12 - GOC addressed this ICU admission? Yes; DNR/DNI as of this admission given tradjectory Medication Reconciliation Completed: Yes Code Status: DNR/DNI Plan of care discussed with: nursing, attending Diagnoses for this hospitalization include: Septic shock, metastatic renal cell carcinoma Dispo: Pending clinical improvement Shane Dillon, NP Pulmonary/Critical Care MedicineI spent 41 cumulative minutes, independent of procedures, in attention to this critically ill patient who has 1 or more organ system failure and a high probability of imminent or life-threatening  deterioration.

## 2023-11-23 NOTE — Progress Notes (Signed)
 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 However, pt developed hypotension requiring vasopressor support and was transferred to MICU for septic shock.  Interval Events/subjective:  No acute complaints overnight. Pt denies having any pain or discomfort morning.  Objective:Blood pressure 106/64, pulse 58, temperature 36.1 C (97 F), temperature source Axillary, resp. rate 12, height 1.753 m (5' 9), weight 77.6 kg (171 lb), SpO2 90%.Notable exam elements:Neuro: alert, able to respond to yes or no questions but speech is garbledHEENT: normocephalic, atraumatic.  Cardiovascular:  rrr, no murmurs/rubsPulmonary: wheezing noted in right upper lung zoneGI: abdomen NTPExtremities:  + BLE edemaAssessment & Plan:Pulmonary:Acute hypoxic respiratory failure, currently on NCLeft MPE s/p thoracentesis 9/12, appears to be exudative based on elevated pleural LDH level--Continue supplemental oxygen--Follow up pleural fluid studiesCV:Shock, likely septic with leucocytosis. Likely biliary source s/p perc chole. Echo unremarkable. Given antibx therapy and placement of perc chole drain, pt continues to have persistent shock. Other possible causes for persistent shock may include obstructive with possible VTE/PE. --Continue vasopressor support and stress dose steroids--Check BLE dopplers and likely CTA chest Renal/Electrolytes:ESRD on HD--appreciate nephrology's assistance, currently on CRRTGI/Nutrition:No acute issues, currently NPO given vasopressor requirementsID:Cholecystitis with E.faecalis--appreciate ID's recommendations--continue antibx, antifungal per ID--Follow up Cdiff testingEndo:Adrenal insufficiency on florinef  which will be  continuedHeme/Onc:Metastatic RCC--Continue enoxaparin  but will also evaluate for VTE as discussed aboveNeuro:Hypoactive delirium--Delirium precautions--Neuro checks q4hDispo: Continue monitoring in ICUI evaluated this patient with NP, [name].  I examined the patient, and reviewed all pertinent radiographs.  I agree with the database, findings, and plan of care recorded in the physician/NP/PA note.  Please refer to it for specific details.  A summary of my assessment and plan is as above._____________________________________________________________________This patient is critically ill with at least one organ system failure associated with a high probability of imminent or life threatening deterioration. The care I have delivered involved high complexity decision making to assess, manipulate, and support vital system function(s), to treat the vital organ system failure and/or prevent further life threatening deterioration of the patient's condition. All nursing documentation, laboratory data, test results, and radiographs were reviewed and interpreted by me. I have established the management plan for this patient's critical illness and have been immediately available to assist with patient care. My documented total critical care time reflects my own patient care time and does not include teaching or procedure time.Critical care time: I spent 45 minutes personally attending to this patient's critical care needs. This critical care time is exclusive of any time for separately billable procedures.Lavetta PARAS. Cyrena, DO9/13/2025, 8:14 AM

## 2023-11-23 NOTE — Provider Consult (Addendum)
 Critical Care Nephrology Attending Progress Note:  11/23/2023 5:02 PMName: Shane Pearson       EMRN: [id number] DOB: [birth year]          Length of Stay:  10_________________________________________________________________________________Overall Impression and Recommendations:  63 y.o. male currently with with ESRD on HD TTS at [facility], stage IV carcinoma (suspected to be renal cell) with intracranial and spinal mets, CHF, HLD, GERD and ischemic colitis who presented to [facility] on 9/3 from urgent care due to a mechanical fall resulting in right humerus fracture. Ortho being consulted. Nephrology consulted for ESRD and provision of dialysis. End Stage Kidney Disease on outpatient HD - now requiring CVVHDFCVVHDF  -  seen and examined during hemodialysis treatment- currently on non-citrate CVVHDF, no UF due to pressor requirements- BFR=200, pre-dilution=800, effluent rate = 23 ml/kg/hourHyperkalemia -  improving with dialysisMetabolic Acidosis - persists with rising lactateCa/Phos - within acceptable limitsPlease see below for supporting details_________________________________________________________________________________Following for management of acute kidney injury and /or provision of hemodialysis.  Patient seen and examined, records reviewed including medications, labs, imaging, and progress notes.  I have also discussed relevant management issues with the team caring for the patient and the Nephrology ICU Team.  Total care time is exclusive of any teaching or procedures.Events:   NC-CVVHDF started yesterdayExam:  On exam, the patient is resting with nasal cannula oxygen.  Vitals: BP 106/64 (BP Location: Left arm)   Pulse 69   Temp 36.2 C (97.2 F) (Skin) Comment (Src): ax probe  Resp 17   Ht 1.753 m (5' 9)   Wt 77.6 kg (171 lb)   SpO2 99%   BMI 25.25 kg/m  .  Cardiac exam shows regular rate, no rub.  The lungs are course breath sounds  anteriorly.   The extremities are with edema.Relevant Renal Data:I/O last 3 completed shifts:09/12 1500 - 09/13 1459In: 2930.1 (37.8 mL/kg) [I.V.:1689 (0.9 mL/kg/hr); IV Piggyback:1241.2]Out: 670 (8.6 mL/kg) [Emesis/NG output:650]Net: 2260.1Weight: 77.6 kg    Recent Labs Lab 09/13/251425 09/13/250517 09/13/250016 09/12/252218 09/12/251422 09/12/250847 09/09/252352 09/09/251237 09/08/252324 Sodium 136 135 134 135   < >  --    < > 136 131* Potassium 4.6 5.0 CANCELED 5.3*   < > 5.8*   < > 4.9 5.2* Chloride 97 96 96 95*   < >  --    < > 94* 91* CO2 16* 19* 15* 16*   < >  --    < > 19* 20 UN 47* 57* 60* 63*   < >  --    < > 35* 55* Creatinine 3.69* 4.43* 4.90* 5.28*   < >  --    < > 4.72* 6.80* Glucose 92 130* 139* 143*   < >  --    < > 68 97 Albumin 3.0* 3.1*  --  3.2*   < > 3.0*  --  3.0* 3.1* ALT  --   --   --   --   --  <5  --  6 <5 AST  --   --   --   --   --  35  --  72* 72*  < > = values in this interval not displayed. No components found with this basename: PROT, BILITOT, ALKPHOS Recent Labs Lab 09/13/251425 09/13/250517 09/13/250016 09/12/252218 09/12/252217 09/12/251423 09/12/251422 09/09/252352 09/09/251237 09/09/250030 09/08/252325 09/08/252324 Calcium  9.0 9.2 8.8 9.0  --   --  8.8   < > 8.1*  --   --  8.4* Albumin 3.0* 3.1*  --  3.2*  --   --  3.0*   < > 3.0*  --   --  3.1* Phosphorus 4.4 4.9* 5.2* 5.3*  --   --  6.1*   < > 4.5  --   --  5.9* ICA @7 .4,WB  --  4.6*  --   --  4.5* 4.4*  --    < >  --   --    < >  --  Lactate  --   --   --   --   --   --   --   --  1.9 2.6*  --  3.3* Lactate ART,WB  --  3.3*  --   --  2.8* 2.1*  --   --   --   --   --   --  Magnesium   --  2.4 2.2  --   --   --  2.2   < > 1.8  --   --  1.9  < > = values in this interval not displayed.  Recent Labs Lab 09/13/250016 09/12/251422 09/11/252349 09/10/252322 09/10/250205 09/09/251435 09/09/251304  09/09/251237 09/08/252324 09/08/251214 WBC 35.2*  --  25.8* 21.7* 21.6*  --   --    < > 12.6* 13.9* Hemoglobin 10.5*  --  9.7* 9.4* 10.1*  --   --    < > 9.7* 9.8* Hematocrit 35*  --  33* 31* 34*  --   --    < > 32* 33* Platelets 688*  --  675* 700* 729*  --   --    < > 633* 607* INR  --   --   --   --  2.0*  --   --   --  1.5* 1.4* aPTT  --  61.1*  --   --   --  38.0* >200.0*  --   --   --   < > = values in this interval not displayed. No components found with this basename: NEUTOPHILPCT, MONOPCT, EOSPCT Plan:  See above for succinct recommendationsAuthor: GLADIS BRAKE, MD,PhD  as of: 11/23/2023  at: 5:02 PM

## 2023-11-23 NOTE — Plan of Care (Signed)
 Problem: SafetyGoal: Patient will remain free of fallsOutcome: MaintainingGoal: Prevent any intentional injuryOutcome: Maintaining Problem: Pain/ComfortGoal: Patient's pain or discomfort is manageableOutcome: Maintaining Problem: MobilityGoal: Patient's functional status is maintained or improvedOutcome: Maintaining Problem: Cognitive functionGoal: Cognitive function will be maintained or return to baselineDescription: Interventions:Delirium AssessmentLIVEBAR AssessmentOutcome: MaintainingGoal: Lines and tethers will be removed as appropriateOutcome: MaintainingGoal: Patient maintains appropriate nutritional intakeOutcome: MaintainingGoal: Vital signs will be within normal limitsOutcome: MaintainingGoal: Evidence for potential causes of delirium will be managedOutcome: MaintainingGoal: Behaviors will return to baselineOutcome: MaintainingGoal: Ambulation and mobility will be maintainedOutcome: MaintainingGoal: Retention of urine and constipation will be managedOutcome: Maintaining Problem: Risk for Impaired Sleep/Wake CycleGoal: The patient will maintain an adequate sleep/wake cycleOutcome: Maintaining Charmaine, RN

## 2023-11-23 NOTE — Progress Notes (Signed)
 Report Given ToCourtney, RNDescriptive Sentence / Reason for Admission Cancer Diagnosis:-stage IV renal cell carcinoma with paraspinal metsReason for Admission:-mechanical fall resulting in right humerus fracture. Current/past oncology treatment: -  S/p 5 fractions of radiation to the cervical spine 9/3, Rdx to re consult on 9/8Hx) ESRD-dialysis T/Th/Sat-neph followingX-RAY: Mildly displaced proximal humeral shaft fracture CT: Slightly comminuted displaced and foreshortened oblique fracture through the proximal humeral diaphysis with posterior displacement 09/05 - MRI head - progression of disease/ new metastatic lesions9/6 - prophylactic procedure to R femur completed - tolerated well, Plan for IR embolization and fixation, ortho following. In sling NWBChronic hypotension-midodrineS/P PCVCActive Issues / Relevant Events Nursing Progress Note: 6-36i- MICU: pt afebrile, VSS, on levophed  and vasopressin  drips. CRRT running without incident. Blood cx drawn off HD line per NP order. Unable to send Blood Cx from periphery after multiple attempts. NP aware.ABG sent with renal function labs per NP order. New bradycardia notes (HR in 50-60's) NP orders EKG. No new orders. No signs or symptoms of respiratory distress. Titrating levophed  for sbp>90. Please refer to doc flowsheets for full assessment, vital signs, and interventions. Call bell within reach of wife staying overnight.Lauraine Cramp, RNTo Do ListLabsRFP:Q8 for 24 hours - 2200, 0600, 1400 (9/13), then Q 12CBC, Mg dailyABG PRNAnticipatory Guidance / Discharge Planning

## 2023-11-23 NOTE — Progress Notes (Signed)
 Report Given ToDez, RNActive Issues / Relevant Events Nursing note: 6-3600i MICU 0700-1900. Patient hypothermic to 34.1, bair hugger applied and providers made aware. Able to turn off bair hugger with temp 36.1.Weaned levo to 32 as tolerated. Patient tolerating 2L NC. PRN dilaudid  given x1 with positive effect. Please see Doc flow sheet for VS, assessment detail, and nursing interventions.Charmaine, RN

## 2023-11-23 NOTE — Progress Notes (Signed)
 INFECTIOUS DISEASES PROGRESS NOTE - [facility] cvg Shane Pearson is seen today - worsening leukocytosis and increasing pressor requirements despite change in antibiotics yesterday. S/p thoracentesis yesterday  Current inpatient medications include:  linezolid   600 mg Intravenous Q12H  meropenem   500 mg Intravenous Q6H  caspofungin   70 mg Intravenous Q24H  pantoprazole   40 mg Intravenous Q24H  hydrocortisone  IV  50 mg Intravenous Q6H  enoxaparin   30 mg Subcutaneous Daily @ 2100 Allergies[1] Relevant PMH, SH, FH reviewed  Objective: Blood pressure 106/64, pulse 71, temperature 36 C (96.8 F), resp. rate 19, height 1.753 m (5' 9), weight 77.6 kg (171 lb), SpO2 98%.Temp (24hrs), Avg:35.4 C (95.7 F), Min:34.1 C (93.4 F), Max:36.2 C (97.2 F)Physical ExamConstitutional:     Appearance: He is ill-appearing. HENT:    Mouth/Throat:    Mouth: Mucous membranes are dry. Eyes:    General: No scleral icterus.Cardiovascular:    Rate and Rhythm: Tachycardia present. Pulmonary:    Breath sounds: Rhonchi (scattered) present. Chest:    Chest wall: No tenderness (at HD line although minimal redness noted of surrounding skin). Abdominal:    General: Bowel sounds are normal.    Palpations: Abdomen is soft. Musculoskeletal:       General: Swelling (right arm in sling.  Fluctuance posterior shoulder) present.    Cervical back: No rigidity or tenderness (at proximal HD line or tunnel).    Right lower leg: Edema present.    Left lower leg: Edema present.    Comments: Right hip mepilex dressings, no redness or tenderness  Skin:   Coloration: Skin is not jaundiced.    Findings: No rash. Neurological:    Mental Status: He is disoriented. Recent Lab, Micro, and Imaging Studies: Basic Metabolic Panel CBC   Lab results: 09/13/250517 Sodium 135 Potassium 5.0 Chloride 96 CO2 19* UN 57* Creatinine 4.43* Glucose  130* Calcium  9.2     Lab results: 09/13/250016 WBC 35.2* Hemoglobin 10.5* Hematocrit 35* RBC 3.9* Platelets 688*  Liver Panel Other Labs   Lab results: 09/13/250517 09/12/251422 09/12/250847 07/26/250548 07/25/250300 Total Protein  --   --  5.8*   < > 6.0* Albumin 3.1*   < > 3.0*   < > 3.1* ALT  --   --  <5   < > 7 AST  --   --  35   < > 37 Alk Phos  --   --  254*   < > 187* Bilirubin,Total  --   --  0.2   < > 0.2 Bilirubin,Direct  --   --  <0.2   < > <0.2 GGT  --   --   --   --  29  < > = values in this interval not displayed.  No results found for: HA1C CRP (mg/L) Date Value 10/09/2023 233 (H) 10/07/2023 219 (H) 10/05/2023 273 (H) 10/03/2023 295 (H) 09/30/2023 338 (H)   Microbiology  No results found for: URINECULTUREBacterial Blood Culture (no units) Date Value 11/22/2023 . 11/19/2023 . 11/19/2023 . 11/18/2023 . 11/18/2023 . No results found for: AERNo results found for: STERTISCULAerobic bacterial wound or drainage culture and stain (no units) Date Value 11/20/2023 Enterococcus faecalis (!) No results found for: ANANo results found for: LWRRESPCLTLegionella Culture (no units) Date Value 11/22/2023 . No results found for: RESCXGram Stain (no units) Date Value 11/22/2023 . 11/20/2023 Gram positive cocci in pairs and chains (!) 10/01/2023 . No results found for: GSCY  Imaging Chest single frontal viewResult Date: 9/13/2025Decreased left pleural effusion status post thoracentesis.  No pneumothorax. Persistent small right pleural effusion. Decreased left greater than right patchy and hazy opacities with improved aeration, likely reflecting improving pulmonary edema and atelectasis. Superimposed aspiration and/or pneumonia may be present in the appropriate clinical setting. END OF IMPRESSION I have personally reviewed the images and the  Resident's/Fellow's interpretation and agree with or edited the findings. Portable US  abdomen limited single quadResult Date: 9/12/2025Suboptimal evaluation due to patient condition and poor acoustic windows. Percutaneous cholecystostomy tube partially visualized in the gallbladder lumen. The gallbladder is nondistended and contains heterogeneous hypoechoic material which may represent sludge and/or hemorrhage. The drain tip is not clearly visualized. Please also see CT performed earlier same day. No biliary duct dilatation. Increased right renal cortical echogenicity may represent medical renal disease. END OF IMPRESSION. CT abdomen and pelvis with contrastResult Date: 9/12/2025Appropriately positioned cholecystostomy tube near the gallbladder fundus. The gallbladder is appropriately collapsed with a small amount of free fluid tracking along the right paracolic gutter. These findings are consistent with expected postprocedural changes. No organized fluid collections. Small volume abdominopelvic ascites. Large volume left pleural effusion. Small right subpulmonic pleural fluid. Areas of nodularity and groundglass opacity in the right lung base which may represent the sequela of aspiration or infection. History of metastatic renal cell carcinoma. Grossly unchanged widespread mesenteric and retroperitoneal lymphadenopathy. Widespread osseous and intramuscular metastasis. Multicystic kidneys bilaterally. No distinct renal mass identified. Diffuse subcutaneous edema, similar to slightly increased in extent compared to the prior examination END OF IMPRESSION I have personally reviewed the images and the Resident's/Fellow's interpretation and agree with or edited the findings. *Chest STANDARD single viewResult Date: 9/12/2025Decreased moderate to large left pleural effusion with improved aeration of the left lung. No pneumothorax appreciated. Background edema/atelectasis noted with trace right  pleural fluid. END OF IMPRESSION I have personally reviewed the images and the Resident's/Fellow's interpretation and agree with or edited the findings. *Chest STANDARD single viewResult Date: 9/12/2025Increasing patchy bilateral airspace opacities likely representing pulmonary edema with superimposed infection/inflammation. Moderate partially loculated left pleural fluid slightly increased from prior. END OF IMPRESSION I have personally reviewed the images and the Resident's/Fellow's interpretation and agree with or edited the findings. * Humerus LEFT standard AP and  Lateral viewsResult Date: 9/12/2025No suspicious osteolytic lesion of the humerus is perceived. No pathologic fracture. Increased bone mineral density possibly related to renal osteodystrophy. END OF IMPRESSION XR: Fracture of the proximal humeral shaft at the level of the deltoid tuberosity with lateral and anterior displacement of the distal fragment as well as apex anterior angulation CT humerus right  Slightly comminuted displaced and foreshortened oblique fracture through the proximal humeral diaphysis with posterior displacement. Associated adjacent soft tissue hematoma.  Arthritic changes of the glenohumeral joint with subcortical cystic changes and possible erosions. Degenerative changes of the acromioclavicular joint  prominent right axillary node measuring 1 cm, nonspecific, correlate clinically   Assessment/ Plan: 63 y.o. 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 s/p radiation to C spine (11/13/23) and on decadron , ESRD on dialysis via HD line TTS schedule, CHF, HLD, hypotension on midodrine  and Florinef , GERD, ischemic colitis who initially presented on 9/3 from urgent care s/p mechanical fall resulted in R humerus fracture and is now s/p prophylactic fixation of R femur on 9/6, embolization of R proximal humerus lesion on 9/8  by IR,  who has now developed shock with dilated gallbladder on imaging now s/p perc drain with enterococcus 4+ on cultures, septic shock worsening on cefepime ,  metronidazole , and vancomycin , now escalated to linezolid , meropenem , tobramycin  and caspofungin  with continued leukemoid reaction and increasing pressor requirement Recommend viral swabs Continue broad spectrum antibioticsRecommend repeating imaging of the right shoulder - exam suggestive of fluid colleciton, has had hematoma there before (CT scan with contrast would be best visualization) Follow  blood cultures and thoracentesis cultures Monitor redness overlying the HD cath, consider moving shoulder strap so as not overlying the neck entry site Cdiff testing if diarrhea Shane Pearson (Sasha) V Shayleen Eppinger M.D. Infectious Diseases50 minutes included face  to face time and non-face to face time on the date of service by the provider.  Time does not include separately billable services [1] AllergiesAllergen Reactions  Penicillins Swelling and Rash   Facial swellingMed History Tech discussed Penicillin allergy with patient on 10/18/23. Please see Antimicrobial Stewardship Chart Note for additional details.  Amoxicillin Swelling and Rash   Facial swellingMed History Tech discussed Amoxicillin allergy with patient on 10/18/23. Please see Antimicrobial Stewardship Chart Note for additional details.  Baclofen Other (See Comments)   confusion  Coconut Nausea And Vomiting  No Known Latex Allergy

## 2023-11-23 NOTE — Progress Notes (Signed)
 Brief orthopedic progress notePatient was assessed for a dressing change. His Mepilex dressing was removed. Overall, his incisions were clean, dry, and intact. There was no surrounding erythema or appearance. A new Mepilex was placed. We'll stay on it until 2-week follow-up from the initial surgery. [facility] Orthopaedic Surgery PGY-2

## 2023-11-23 NOTE — Plan of Care (Signed)
 Problem: Safety  Goal: Patient will remain free of falls  Outcome: Maintaining  Goal: Prevent any intentional injury  Outcome: Maintaining     Problem: Pain/Comfort  Goal: Patient's pain or discomfort is manageable  Outcome: Maintaining

## 2023-11-24 ENCOUNTER — Inpatient Hospital Stay

## 2023-11-24 ENCOUNTER — Inpatient Hospital Stay: Admitting: Radiology

## 2023-11-24 ENCOUNTER — Encounter: Payer: Self-pay | Admitting: Gastroenterology

## 2023-11-24 DIAGNOSIS — C7802 Secondary malignant neoplasm of left lung: Secondary | ICD-10-CM

## 2023-11-24 DIAGNOSIS — K922 Gastrointestinal hemorrhage, unspecified: Secondary | ICD-10-CM

## 2023-11-24 DIAGNOSIS — C7801 Secondary malignant neoplasm of right lung: Secondary | ICD-10-CM

## 2023-11-24 DIAGNOSIS — R578 Other shock: Secondary | ICD-10-CM

## 2023-11-24 DIAGNOSIS — K921 Melena: Secondary | ICD-10-CM

## 2023-11-24 LAB — FRESH FROZEN PLASMA
Component blood type: A POS
Component blood type: A POS
Component blood type: A POS
Component blood type: A POS

## 2023-11-24 LAB — CBC AND DIFFERENTIAL
Baso # K/uL: 0 THOU/uL (ref 0.0–0.2)
Eos # K/uL: 0 THOU/uL (ref 0.0–0.5)
Hematocrit: 33 % — ABNORMAL LOW (ref 37–52)
Hemoglobin: 9.9 g/dL — ABNORMAL LOW (ref 12.0–17.0)
Lymph # K/uL: 0 THOU/uL — ABNORMAL LOW (ref 1.0–5.0)
MCV: 89 fL (ref 75–100)
Mono # K/uL: 5.2 THOU/uL — ABNORMAL HIGH (ref 0.1–1.0)
Neut # K/uL: 32.5 THOU/uL — ABNORMAL HIGH (ref 1.5–6.5)
Nucl RBC # K/uL: 5 THOU/uL — ABNORMAL HIGH (ref 0.0–0.1)
Nucl RBC %: 12.7 /100{WBCs} — ABNORMAL HIGH (ref 0.0–0.2)
Platelets: 612 THOU/uL — ABNORMAL HIGH (ref 150–450)
RBC: 3.7 MIL/uL — ABNORMAL LOW (ref 4.0–6.0)
RDW: 23.9 % — ABNORMAL HIGH (ref 0.0–15.0)
Seg Neut %: 80.1 %
WBC: 39.1 THOU/uL — ABNORMAL HIGH (ref 3.5–11.0)

## 2023-11-24 LAB — THAWED PLASMA
Component blood type: A NEG
Component blood type: A POS
Component blood type: A POS
Component blood type: A POS
Component blood type: A POS
Component blood type: A POS
Component blood type: A POS
Component blood type: A POS
Component blood type: A POS
Component blood type: A NEG
Component blood type: A NEG
Component blood type: A POS
Component blood type: A POS

## 2023-11-24 LAB — RED BLOOD CELLS
Component blood type: A POS
Component blood type: A POS
Component blood type: A POS
Component blood type: A POS
Component blood type: O POS
Component blood type: O POS
Component blood type: O POS
Component blood type: O POS
Component blood type: A POS
Component blood type: A POS
Component blood type: A POS
Component blood type: A POS
Component blood type: O POS
Component blood type: O POS
Component blood type: O POS
Component blood type: O POS
Component blood type: O POS
Component blood type: O POS
Component blood type: O POS
Component blood type: O POS
Component blood type: O POS
Component blood type: A POS
Component blood type: A POS
Component blood type: A POS
Component blood type: A POS

## 2023-11-24 LAB — RENAL FUNCTION PANEL
Albumin: 2.8 g/dL — ABNORMAL LOW (ref 3.5–5.2)
Albumin: 2.9 g/dL — ABNORMAL LOW (ref 3.5–5.2)
Albumin: 3.3 g/dL — ABNORMAL LOW (ref 3.5–5.2)
Anion Gap: 17 — ABNORMAL HIGH (ref 7–16)
Anion Gap: 18 — ABNORMAL HIGH (ref 7–16)
Anion Gap: 21 — ABNORMAL HIGH (ref 7–16)
CO2: 16 mmol/L — ABNORMAL LOW (ref 20–28)
CO2: 18 mmol/L — ABNORMAL LOW (ref 20–28)
CO2: 19 mmol/L — ABNORMAL LOW (ref 20–28)
Calcium: 8.7 mg/dL (ref 8.6–10.2)
Calcium: 9 mg/dL (ref 8.6–10.2)
Calcium: 9.3 mg/dL (ref 8.6–10.2)
Chloride: 100 mmol/L (ref 96–108)
Chloride: 98 mmol/L (ref 96–108)
Chloride: 99 mmol/L (ref 96–108)
Creatinine: 2.27 mg/dL — ABNORMAL HIGH (ref 0.67–1.17)
Creatinine: 2.38 mg/dL — ABNORMAL HIGH (ref 0.67–1.17)
Creatinine: 2.95 mg/dL — ABNORMAL HIGH (ref 0.67–1.17)
Glucose: 109 mg/dL — ABNORMAL HIGH (ref 60–99)
Glucose: 110 mg/dL — ABNORMAL HIGH (ref 60–99)
Glucose: 113 mg/dL — ABNORMAL HIGH (ref 60–99)
Lab: 30 mg/dL — ABNORMAL HIGH (ref 6–20)
Lab: 30 mg/dL — ABNORMAL HIGH (ref 6–20)
Lab: 37 mg/dL — ABNORMAL HIGH (ref 6–20)
Phosphorus: 3.2 mg/dL (ref 2.7–4.5)
Phosphorus: 3.7 mg/dL (ref 2.7–4.5)
Phosphorus: 5.1 mg/dL — ABNORMAL HIGH (ref 2.7–4.5)
Potassium: 3.7 mmol/L (ref 3.3–5.1)
Potassium: 3.9 mmol/L (ref 3.3–5.1)
Potassium: 4.5 mmol/L (ref 3.3–5.1)
Sodium: 134 mmol/L (ref 133–145)
Sodium: 135 mmol/L (ref 133–145)
Sodium: 137 mmol/L (ref 133–145)
eGFR BY CREAT: 23 — AB
eGFR BY CREAT: 30 — AB
eGFR BY CREAT: 31 — AB

## 2023-11-24 LAB — ART GASES / WHOLE BLOOD PANEL
Base Excess, Arterial: -7 mmol/L — ABNORMAL LOW (ref <=2)
Base Excess, Arterial: -8 mmol/L — ABNORMAL LOW (ref <=2)
Base Excess, Arterial: -8 mmol/L — ABNORMAL LOW (ref <=2)
CO2,ART (Calc): 18 mmol/L — ABNORMAL LOW (ref 23–28)
CO2,ART (Calc): 19 mmol/L — ABNORMAL LOW (ref 23–28)
CO2,ART (Calc): 21 mmol/L — ABNORMAL LOW (ref 23–28)
CO: 4.2 %
CO: 1.8 %
CO: 1.9 %
Chloride,WB: 101 mmol/L (ref 96–108)
Chloride,WB: 101 mmol/L (ref 96–108)
Chloride,WB: 102 mmol/L (ref 96–108)
FO2 Hb, Arterial: 90 % (ref 90–100)
FO2 Hb, Arterial: 95 % (ref 90–100)
FO2 Hb, Arterial: >96 % (ref 90–100)
Glucose,WB: 122 mg/dL — ABNORMAL HIGH (ref 60–99)
Glucose,WB: 143 mg/dL — ABNORMAL HIGH (ref 60–99)
Glucose,WB: 121 mg/dL — ABNORMAL HIGH (ref 60–99)
HCO3, Arterial: 17 mmol/L — ABNORMAL LOW (ref 21–26)
HCO3, Arterial: 18 mmol/L — ABNORMAL LOW (ref 21–26)
HCO3, Arterial: 20 mmol/L — ABNORMAL LOW (ref 21–26)
Hemoglobin: 9 g/dL — ABNORMAL LOW (ref 12.0–17.0)
Hemoglobin: 16.1 g/dL (ref 12.0–17.0)
Hemoglobin: 17.7 g/dL — ABNORMAL HIGH (ref 12.0–17.0)
ICA @7.4,WB: 4.9 mg/dL (ref 4.8–5.2)
ICA @7.4,WB: 4.4 mg/dL — ABNORMAL LOW (ref 4.8–5.2)
ICA @7.4,WB: 4.7 mg/dL — ABNORMAL LOW (ref 4.8–5.2)
ICA Uncorr,WB: 5 mg/dL (ref 4.8–5.2)
ICA Uncorr,WB: 4.6 mg/dL — ABNORMAL LOW (ref 4.8–5.2)
ICA Uncorr,WB: 5 mg/dL (ref 4.8–5.2)
Lactate ART,WB: 2.8 mmol/L — ABNORMAL HIGH (ref 0.3–0.8)
Lactate ART,WB: 3.4 mmol/L — ABNORMAL HIGH (ref 0.3–0.8)
Lactate ART,WB: 2.9 mmol/L — ABNORMAL HIGH (ref 0.3–0.8)
Methemoglobin: 2.4 % — ABNORMAL HIGH (ref 0.0–1.0)
Methemoglobin: 1.3 % — ABNORMAL HIGH (ref 0.0–1.0)
Methemoglobin: 1.5 % — ABNORMAL HIGH (ref 0.0–1.0)
NA, WB: 132 mmol/L — ABNORMAL LOW (ref 135–145)
NA, WB: 132 mmol/L — ABNORMAL LOW (ref 135–145)
NA, WB: 131 mmol/L — ABNORMAL LOW (ref 135–145)
Potassium,WB: 3.8 mmol/L (ref 3.3–4.6)
Potassium,WB: 3.7 mmol/L (ref 3.3–4.6)
Potassium,WB: 3.7 mmol/L (ref 3.3–4.6)
pCO2, Arterial: 28 mmHg — ABNORMAL LOW (ref 35–46)
pCO2, Arterial: 38 mmHg (ref 35–46)
pCO2, Arterial: 47 mmHg — ABNORMAL HIGH (ref 35–46)
pH: 7.4 (ref 7.35–7.45)
pH: 7.28 — ABNORMAL LOW (ref 7.35–7.45)
pH: 7.23 — ABNORMAL LOW (ref 7.35–7.45)
pO2,Arterial: 59 mmHg — ABNORMAL LOW (ref 80–100)
pO2,Arterial: 73 mmHg — ABNORMAL LOW (ref 80–100)
pO2,Arterial: 204 mmHg — ABNORMAL HIGH (ref 80–100)

## 2023-11-24 LAB — BLOOD CULTURE
Bacterial Blood Culture: 0
Bacterial Blood Culture: 0
Bacterial Blood Culture: 0
Bacterial Blood Culture: 0

## 2023-11-24 LAB — PLATELETS,PHERESIS
Component blood type: A POS
Component blood type: A POS

## 2023-11-24 LAB — TYPE AND SCREEN
ABO RH Blood Type: A POS
Antibody Screen: NEGATIVE

## 2023-11-24 LAB — DIFF MANUAL
Bands %: 3 % (ref 0–10)
Diff Based On: 121 {cells}
Metamyelocyte %: 3 % — ABNORMAL HIGH (ref 0–1)
Myelocyte %: 2 % — ABNORMAL HIGH

## 2023-11-24 LAB — HEPARINASE TEG THROMBELASTOGRAPH
Heparinase A Angle: 73 deg (ref 53.0–73.0)
Heparinase A Angle: 62.3 deg (ref 53.0–73.0)
Heparinase Coagulation Index: 1.1 (ref <=3.0)
Heparinase Coagulation Index: -1.1 (ref <=3.0)
Heparinase K Time: 1.3 min (ref 1.0–3.0)
Heparinase K Time: 2 min (ref 1.0–3.0)
Heparinase LY30: 0 % (ref 0.0–8.0)
Heparinase LY30: 0 % (ref 0.0–8.0)
Heparinase Maximum Amplitude: 81.5 mm — ABNORMAL HIGH (ref 50.0–72.0)
Heparinase Maximum Amplitude: 67.4 mm (ref 50.0–72.0)
Heparinase R Time: 9.3 min (ref 4.0–10.0)
Heparinase R Time: 8.5 min (ref 4.0–10.0)

## 2023-11-24 LAB — CRYOPRECIPITATE: Component blood type: A POS

## 2023-11-24 LAB — MAGNESIUM: Magnesium: 2.2 mg/dL (ref 1.6–2.5)

## 2023-11-24 LAB — TOBRAMYCIN LEVEL, RANDOM: Tobramycin Level: 2.8 ug/mL

## 2023-11-24 MED ORDER — IOHEXOL 350 MG/ML (OMNIPAQUE) IV SOLN 500ML BOTTLE *I*
1.0000 mL | Freq: Once | INTRAVENOUS | Status: AC
Start: 2023-11-24 — End: 2023-11-24
  Administered 2023-11-24: 116 mL via INTRAVENOUS

## 2023-11-24 MED ORDER — MIDAZOLAM HCL 1 MG/ML IJ SOLN *I* WRAPPED
INTRAMUSCULAR | Status: AC
Start: 2023-11-24 — End: 2023-11-25
  Filled 2023-11-24: qty 2

## 2023-11-24 MED ORDER — HYDROMORPHONE HCL PF 1 MG/ML IJ SOLN *WRAPPED*
1.0000 mg | Freq: Once | INTRAMUSCULAR | Status: AC
Start: 2023-11-24 — End: 2023-11-24

## 2023-11-24 MED ORDER — HEPARIN (PORCINE) IN 0.9% NACL 2 UNITS/ML IJ SOLN WRAPPED *I*
Status: AC
Start: 2023-11-24 — End: 2023-11-24
  Filled 2023-11-24: qty 1000

## 2023-11-24 MED ORDER — CALCIUM GLUCONATE 9.4 MEQ (2,000 MG) IN NS 110 ML *I*
INTRAVENOUS | Status: AC
Start: 2023-11-24 — End: 2023-11-24
  Administered 2023-11-24: 0 meq via INTRAVENOUS
  Filled 2023-11-24: qty 110

## 2023-11-24 MED ORDER — CALCIUM CHLORIDE 10 % IV SOLN *I*
14.0000 meq | Freq: Once | INTRAVENOUS | Status: AC
Start: 2023-11-24 — End: 2023-11-24
  Administered 2023-11-24: 14 meq via INTRAVENOUS

## 2023-11-24 MED ORDER — HYDROMORPHONE HCL PF 1 MG/ML IJ SOLN *WRAPPED*
INTRAMUSCULAR | Status: AC
Start: 2023-11-24 — End: 2023-11-24
  Administered 2023-11-24: 1 mg via INTRAVENOUS
  Filled 2023-11-24: qty 1

## 2023-11-24 MED ORDER — CALCIUM GLUCONATE 10 % IV SOLN *I*
INTRAVENOUS | Status: AC
Start: 2023-11-24 — End: 2023-11-24
  Filled 2023-11-24: qty 10

## 2023-11-24 MED ORDER — CALCIUM GLUCONATE 4.7 MEQ (1,000 MG) IN 50 ML WRAPPED *I*
INTRAVENOUS | Status: AC
Start: 2023-11-24 — End: 2023-11-25
  Filled 2023-11-24: qty 50

## 2023-11-24 MED ORDER — HEPARIN SODIUM (PORCINE) 1000 UNIT/ML IJ SOLN *WRAPPED*
Status: AC
Start: 2023-11-24 — End: 2023-11-24
  Filled 2023-11-24: qty 10

## 2023-11-24 MED ORDER — SODIUM CHLORIDE 0.9 % IV SOLN WRAPPED *I*
3.0000 mL/h | Status: DC
Start: 2023-11-24 — End: 2023-11-24

## 2023-11-24 MED ORDER — HYDROMORPHONE HCL PF 1 MG/ML IJ SOLN *WRAPPED*
0.2500 mg | INTRAMUSCULAR | Status: DC | PRN
Start: 2023-11-24 — End: 2023-11-27
  Administered 2023-11-25 (×2): 0.25 mg via INTRAVENOUS
  Filled 2023-11-24 (×2): qty 0.5

## 2023-11-24 MED ORDER — MIDAZOLAM HCL 1 MG/ML IJ SOLN *I* WRAPPED
2.0000 mg | Freq: Once | INTRAMUSCULAR | Status: DC
Start: 2023-11-24 — End: 2023-11-25

## 2023-11-24 MED ORDER — HYDROMORPHONE 1 MG/ML IN SODIUM CHLORIDE 0.9% INFUSION *I*
1.0000 mg/h | INTRAMUSCULAR | Status: DC
Start: 2023-11-24 — End: 2023-11-24
  Administered 2023-11-24: 1 mg/h via INTRAVENOUS
  Administered 2023-11-24 (×2): 2 mg/h via INTRAVENOUS
  Administered 2023-11-24: 1 mg/h via INTRAVENOUS
  Administered 2023-11-24: 2 mg/h via INTRAVENOUS
  Filled 2023-11-24: qty 65

## 2023-11-24 MED ORDER — PANTOPRAZOLE SODIUM 40 MG IV SOLR *I*
40.0000 mg | Freq: Two times a day (BID) | INTRAVENOUS | Status: DC
Start: 2023-11-25 — End: 2023-12-23
  Administered 2023-11-25 (×2): 40 mg via INTRAVENOUS
  Filled 2023-11-24 (×2): qty 10

## 2023-11-24 MED ORDER — CALCIUM GLUCONATE 9.4 MEQ (2,000 MG) IN NS 110 ML *I*
9.4000 meq | Freq: Once | INTRAVENOUS | Status: AC
Start: 2023-11-24 — End: 2023-11-24
  Administered 2023-11-24: 9.4 meq via INTRAVENOUS
  Filled 2023-11-24: qty 110

## 2023-11-24 MED ORDER — SODIUM BICARBONATE 8.4 % IV SOLN *I*
INTRAVENOUS | Status: AC
Start: 2023-11-24 — End: 2023-11-24
  Administered 2023-11-24: 0 meq via INTRAVENOUS
  Filled 2023-11-24: qty 50

## 2023-11-24 MED ORDER — LIDOCAINE HCL 1 % IJ SOLN *I*
INTRAMUSCULAR | Status: AC
Start: 2023-11-24 — End: 2023-11-24
  Filled 2023-11-24: qty 20

## 2023-11-24 MED ORDER — CALCIUM GLUCONATE 9.4 MEQ (2,000 MG) IN NS 110 ML *I*
9.4000 meq | Freq: Once | INTRAVENOUS | Status: AC
Start: 2023-11-24 — End: 2023-11-24
  Administered 2023-11-24: 9.4 meq via INTRAVENOUS

## 2023-11-24 MED ORDER — CALCIUM GLUCONATE 9.4 MEQ (2,000 MG) IN NS 110 ML *I*
INTRAVENOUS | Status: AC
Start: 2023-11-24 — End: 2023-11-25
  Filled 2023-11-24: qty 110

## 2023-11-24 MED ORDER — SODIUM BICARBONATE 8.4 % IV SOLN *I*
50.0000 meq | Freq: Once | INTRAVENOUS | Status: AC
Start: 2023-11-24 — End: 2023-11-24
  Administered 2023-11-24: 50 meq via INTRAVENOUS

## 2023-11-24 MED ORDER — HYDROMORPHONE BOLUS FROM 1 MG/ML INFUSION *I*
0.5000 mg | INTRAMUSCULAR | Status: DC | PRN
Start: 2023-11-24 — End: 2023-11-24
  Administered 2023-11-24 (×2): 1 mg via INTRAVENOUS

## 2023-11-24 NOTE — Progress Notes (Incomplete)
 Spoke with wife  who indicated that she is aware of the plan Patient being prepped for IR MTP cooler to beside to accompany pt down to IR for back up Pressure stable  per aline MAP > ' No active blled foound after inrergating. Assuming bled corrected duting resucisation

## 2023-11-24 NOTE — Progress Notes (Addendum)
 INFECTIOUS DISEASES DAILY PROGRESS NOTENoted hematochezia , required hi dose pressors, arranged for stat CTA abdomen, cecal bleed demonstrated, for embolization by IRSUBJECTIVE: Currently sleeping. ALLERGIES:Allergies[1]PHYSICAL EXAM:Temp:  [35.1 C (95.2 F)-36.2 C (97.1 F)] Temp src: Axillary (09/14 1200)Heart Rate:  [55-92] Resp:  [11-27] SpO2:  [90 %-100 %] Now on norepinephrine  and vasopressinArea of redness overlying HD catheterGeneral: no acute distressHEENT: NC/AT. EOMI. Cardiovascular: S1S2RRRPulmonary: lungs clearAbdominal:firm. Skin: warm and dryLABS:Recent Labs Lab 09/14/251314 09/13/252334 09/13/250016 09/11/252349 WBC  --  39.1* 35.2* 25.8* Hemoglobin 8.5* 9.9* 10.5* 9.7* Hematocrit 29* 33* 35* 33* Platelets 524* 612* 688* 675* Recent Labs Lab 09/14/251219 09/13/252334 09/13/251425 Sodium 134 135 136 Potassium 3.9 4.5 4.6 CO2 18* 16* 16* UN 30* 37* 47* Creatinine 2.38* 2.95* 3.69* Glucose 109* 110* 92 Calcium  8.7 9.0 9.0   Lab results: 09/14/251219 09/12/251422 09/12/250847 07/26/250548 07/25/250300 Total Protein  --   --  5.8*   < > 6.0* Albumin 2.8*   < > 3.0*   < > 3.1* ALT  --   --  <5   < > 7 AST  --   --  35   < > 37 Alk Phos  --   --  254*   < > 187* Bilirubin,Total  --   --  0.2   < > 0.2 Bilirubin,Direct  --   --  <0.2   < > <0.2 GGT  --   --   --   --  29  < > = values in this interval not displayed. No results for input(s): CRP, ESR in the last 168 hours.MICROBIOLOGY:9/3 COVID/flu/RSV - negative9/8 blood cultures - NGTD9/10 biliary culture - E faecalis9/12 blood cx X 2 negative to date9/13 C diff negativeANTIBIOTICS:Cefazolin  9/6Caspfungin 9/8-9/9, 9/12-Flagyl  9/9-9/12Vanco 9/9-9/11Linezolid 9/12-Meropenem  9/12-Tobramycin  9/12ASSESSMENT :63 y.o. male with medical history notable for stage IV  carcinoma (likely renal cell) with intracranial and spinal mets s/p radiation to C spine (11/13/23) and on decadron , ESRD on dialysis via HD line TTS schedule, CHF, HLD, hypotension on midodrine  and Florinef , GERD, ischemic colitis who initially presented on 9/3 from urgent care s/p mechanical fall resulted in R humerus fracture and is now s/p prophylactic fixation of R femur on 9/6, embolization of R proximal humerus lesion on 9/8 by IR,  who has now developed shock with dilated gallbladder on imaging now s/p perc drain with enterococcus 4+ on cultures, septic shock worsening on cefepime , metronidazole , and vancomycin , now escalated to linezolid , meropenem , tobramycin  and caspofungin  with continued leukemoid reaction and increasing pressor requirement RECOMMENDATIONS:Continue present abx regimen of linezolid , meropenem , caspofunginFollow up cxFor CT angio of chest, abdomen, pelvis. Watch for further hematochezia.Per prior note consider evaluate re CT scan shoulder, depending on his clinical course:  note of course prioritize testing and procedures. The GI bleed is the priority. Noted discussions of goals of care. Watch platelets: if go down then must stop linezolid . Rosalynn Kennedy, MDInfectious Diseases AttendingPager [phone] [1] AllergiesAllergen Reactions  Penicillins Swelling and Rash   Facial swellingMed History Tech discussed Penicillin allergy with patient on 10/18/23. Please see Antimicrobial Stewardship Chart Note for additional details.  Amoxicillin Swelling and Rash   Facial swellingMed History Tech discussed Amoxicillin allergy with patient on 10/18/23. Please see Antimicrobial Stewardship Chart Note for additional details.  Baclofen Other (See Comments)   confusion  Coconut Nausea And Vomiting  No Known Latex Allergy

## 2023-11-24 NOTE — Progress Notes (Signed)
 [facility] Nursing Procedure Bevin Mayall  [id number] Procedure (as described by patient): angio for GI bleed    Status:Completed. Patient tolerated procedure wellSpecimen collection:noSponge count:N/AFluid removed:NoProcedure dressing site location:Right GroinDressing type:gauze and tegadermDressing status:Clean, dry and intact Hematoma:Not evidentTube labeled:N/AMedication received:Lidocaine  5 ml and Contrast of Omni 350Cardiovascular: Peripheral Pulses present post procedure Implant patient information given to patient or parent/guardian:N/ARadiation overexposure: NoReport given to:Unit Nurse. Unit 636i  Name FelizLast Filed Vitals  11/24/23 2150 BP:  Pulse: 96 Resp: 10 Temp:  SpO2: 93%

## 2023-11-24 NOTE — Provider Consult (Signed)
 Nephrology Attending NoteRequest from Interventional Radiology to use HD catheter for contrast administration.  Patient with GI bleed.Catheter may be used for IV contrast administration in this emergency situationPlease ensure that catheter is flushed and heparin  dwell instilled after use to maintain patency.Jorden Minchey, FI,EyI5:70 PM9/14/2025

## 2023-11-24 NOTE — H&P (View-Only) (Signed)
 Nephrology Attending NoteRequest from Interventional Radiology to use HD catheter for contrast administration.  Patient with GI bleed.Catheter may be used for IV contrast administration in this emergency situationPlease ensure that catheter is flushed and heparin  dwell instilled after use to maintain patency.Shane Pearson, FI,EyI5:70 PM9/14/2025

## 2023-11-24 NOTE — Invasive Procedure Plan of Care (Signed)
 CONSENT FOR MEDICALOR SURGICAL PROCEDURE                            Patient Name: Shane Pearson 89 MR                                                              DOB: 1960-12-21     Please read this form or have someone read it to you. It's important to understand all parts of this form. If something isn't clear, ask us  to explain. When you sign it, that means you understand the form and give us  permission to do this surgery or procedure. I agree for Jama Alm BRAVO, MD , and Vascular and Interventional Radiology Attendings, Fellows, Residents and Advanced Practice Providers. along with any assistants* they may choose, to treat the following condition(s): Bleeding in the large bowel.  By doing this surgery or procedure on me: Imaging assessment of blood vessels. Blood vessel dilation, stenting or occlusion off of the vessel may also be performed if necessary. This is also known as: Angiogram. Possible embolization/angioplasty/stenting/thrombolysis. Laterality: *if you'd like a list of the assistants, please ask. We can give that to you.1. The care provider has explained my condition to me. They have told me how the procedure can help me. They have told me about other ways of treating my condition. I understand the care provider cannot guarantee the result of the procedure. If I don't have this procedure, my other choices are: Not to perform the procedure.2. The care provider has told me the risks (problems that can happen) of the procedure. I understand there may be unwanted results. The risks that are related to this procedure include: Bleeding, infection, allergic reaction to contrast dye, injury to vessels,  injury to surrounding organs and structures, dislodgment of embolic material to unintended areas of the body, and in very rare circumstances death.3. I understand that during the procedure, my care provider may find a condition that we didn't know about before the  treatment started. Therefore, I agree that my care provider can perform any other treatment which they think is necessary and available.4. I give permission to the hospital and/or its departments to examine and keep tissue, blood, body parts, fluids or materials removed from my body during the procedure(s) to aid in diagnosis and treatment, after which they may be used for scientific research or teaching by appropriate persons. If these materials are used for science or teaching, my identity will be protected. I will no longer own or have any rights to these materials regardless of how they may be used.5. My care provider might want a representative from a medical device company to be there during my procedure. I understand that person works for:        The ways they might help my care provider during my procedure include:      6. Here are my decisions about receiving blood, blood products, or tissues. I understand my decisions cover the time before, during and after my procedure, my treatment, and my time in the hospital. After my procedure, if my condition changes a lot, my care provider will talk with me again about receiving blood or blood products. At that time, my care provider might need  me to review and sign another consent form, about getting or refusing blood.I understand that the blood is from the community blood supply. Volunteers donated the blood, the volunteers were screened for health problems. The blood was examined with very sensitive and accurate tests to look for hepatitis, HIV/AIDS, and other diseases. Before I receive blood, it is tested again to make sure it is the correct type.My chances of getting a sickness from blood products are small. But no transfusion is 100% safe. I understand that my care provider feels the good I will receive from the blood is greater than the chances of something going wrong. My care provider has answered my questions about blood  products.My decisionabout blood orblood products   My decision about tissueImplants  I understand thisform.My care provideror his/herassistants haveexplained: What I am having done and why I need it.What other choices I can make instead of having this done.The benefits and possible risks (problems) to me of having this done.The benefits and possible risks (problems) to me of receiving transplants, blood, or blood products.There is no guarantee of the results.The care provider may not stay with me the entire time that I am in the operating or procedure room.My provider has explained how this may affect my procedure. My provider has answered myquestions about this. I give mypermission forthis surgery orprocedure.        _________________________________________                                   My signature(or parent or other person authorized to sign for you, if you are unable to sign for yourself or if you are under 57 years old)       _____________   Date    (MM/DD/YYYY)     _______  Time  Electronic Signatures will display at the bottom of the consent form.Care provider's statement: I have discussed the planned procedure, including the possibility for transfusion of bloodproducts or receipt of tissue as necessary; expected benefits; the possible complications and risks; and possible alternativesand their benefits and risks with the patients or the patient's surrogate. In my opinion, the patient or the patient's surrogateunderstands the proposed procedure, its risks, benefits and alternatives.      Electronically signed by: [name], MD                                          11/24/2023       Date    7:53 PM      Time Patient was not able to be consented due to his acute medical condition. Phone consent was obtained from Shane Pearson at 7:52pm on 11/24/23. Consent was  confirmed by Shane Kleine, RN.

## 2023-11-24 NOTE — Progress Notes (Signed)
 Report Given ToCourtney, RN, CeCe RNActive Issues / Relevant Events [id number] [facility]U Nursing note [id number]: A&O to self, delirious, incomprehensible,  follows commands, remains on bairhugger Tmax 35.3, x1 PRN dilaudid  given for pain w + effect. Cardiac: HR NSR, remains on Levo/ Vaso titrating for SBP >90.Pulm: remains on 2L NC tolerating fine no desats.GI/GU: NPO, NG to LIWS w + aspirate, X2 large BMLabs: c-dif negCRRT: no fluid removal, tolerating fair

## 2023-11-24 NOTE — Progress Notes (Addendum)
 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 However, pt developed hypotension requiring vasopressor support and was transferred to MICU for septic shock.  Interval Events/subjective:  No acute complaints overnight. Per wife, he received pain medication related to abdominal pain.  This afternoon, pt was noted having hematochezia which required MTP with worsening hypotension with increasing vasopressor requirements.   Objective:Blood pressure 106/64, pulse 104, temperature 36.1 C (96.9 F), temperature source Axillary, resp. rate (!) 9, height 1.753 m (5' 9), weight 77.6 kg (171 lb), SpO2 100%.Notable exam elements:Neuro: alert, able to respond to yes or no questions but speech is garbledHEENT: normocephalic, atraumatic.  Cardiovascular:  rrr, no murmurs/rubsPulmonary: wheezing noted in right upper lung zoneGI: abdomen soft, pt grimaced with palpation but not consistent with repeat palpation of same areaExtremities:  + BLE edemaAssessment & Plan:Pulmonary:Acute hypoxic respiratory failure, currently on NCLeft MPE s/p thoracentesis 9/12, appears to be exudative based on elevated pleural LDH level--Continue supplemental oxygen--Follow up pleural fluid studies, still pendingCV:Shock, likely septic with leucocytosis. Likely biliary source s/p perc chole.Given recent GI bleeding, there is component of hemorrhagic shock which required MTP. --Continue vasopressor support and stress dose steroids--CTA chest and abdomen --> no PE but did show active bleed in cecum.  IR consulted. Renal/Electrolytes:ESRD on HD--appreciate nephrology's assistance--Stop CRRT until hypotension improvesGI/Nutrition:GI bleed with hematochezia. CT abdomen demonstrated active cecal  bleed--IR consult for embolization--MTP activated--Hold chemical DVT ppx, apply SCDs--Continue IV PPI--H/H q6hID:Cholecystitis with E.faecalis--appreciate ID's recommendations--continue antibx, antifungal per IDEndo:Adrenal insufficiency on florinef  which will be continuedHeme/Onc:Metastatic RCC--Continue enoxaparin  but will also evaluate for VTE as discussed aboveNeuro:Hypoactive delirium--Delirium precautions--Neuro checks q4hDispo: Continue monitoring in ICUI evaluated this patient with NP, [name].  I examined the patient, and reviewed all pertinent radiographs.  I agree with the database, findings, and plan of care recorded in the physician/NP/PA note.  Please refer to it for specific details.  A summary of my assessment and plan is as above._____________________________________________________________________This patient is critically ill with at least one organ system failure associated with a high probability of imminent or life threatening deterioration. The care I have delivered involved high complexity decision making to assess, manipulate, and support vital system function(s), to treat the vital organ system failure and/or prevent further life threatening deterioration of the patient's condition. All nursing documentation, laboratory data, test results, and radiographs were reviewed and interpreted by me. I have established the management plan for this patient's critical illness and have been immediately available to assist with patient care. My documented total critical care time reflects my own patient care time and does not include teaching or procedure time.Critical care time: I spent 45 minutes personally attending to this patient's critical care needs. This critical care time is exclusive of any time for separately billable procedures.Lavetta PARAS. Cyrena, DO9/14/2025, 5:54 PM

## 2023-11-24 NOTE — Progress Notes (Signed)
 Medical ICU Progress Note LOS: 11 Days Interval History/Subjective: - NE down to 22 (40 at peal on 9/13)- discussion with wife at bedside about nutrition and survivabilityObjective Vital Signs: BP 106/64 (BP Location: Left arm)   Pulse 78   Temp 35.2 C (95.3 F) (Axillary)   Resp 19   Ht 1.753 m (5' 9)   Wt 77.6 kg (171 lb)   SpO2 99%   BMI 25.25 kg/m  Physical ExamPhysical Exam General: in NADNeuro: lethargic, directs gaze, did not follow commands HEENT: NG, NC in place Pulm: lung coarse Cardiac: s1s2, brady GI: abdomen soft, NT/ND GU: aneuric Extremities: wwp Integument: wwp  Assessment: Shane Pearson is a 63 y.o. 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  Source of sepsis appeared to be biliary s/p perc cholecystostomy tube 9/10 with Enterococcus however blood cultures were not positive and he remains on dual vasopressors after drain placement giving concern for another process as subsequent imaging shows gallbladder is adequately drained by perc drain.He is DNR/DNI.Plan: Pulmonary: acute pulmonary insufficiency, malignant effusion- Nasal cannula currently on 4 liters, wean for SpO2 92%, oxygen requirement new as of 9/12 afternoon- thoracentesis 7/22 malignant, repeated 9/12 with ongoing sepsis and still exudative with only 1-10 PMNs and no growth- repeat CXR to evaluate parenchyma after fluid drainage- Consider CTA for PE today, will get BLE dopplersCardiovascular: septic shock, he does have chronic hypotension and follows with [facility] without cause identifiedEcho 9/10  hyperdynamic , adequate response stress test in july- Continue home Atorvastatin  20 mg - Continue home asa 81 mg daily - On midodrine  10 TID at home- NE at 22, vaso at 0.04 with goal SBP 90ID: septic shock, enterococcal cholecystitis s/p C-tube, continued vasopressor requirement after tube placement envoking other source for shock- appreciate ID input, abx broadened 9/12 from cefepime /flagyl /linezolid  to meropenem /linezolid , caspo- C-diff negative- ID wants shoulder image?  Consider CT chest and get shoulder for source of infection- BC 9/8, 9/9, 9/12 NGTD- IR cultures of GB 9/10 with pan sensitive E-faecalis Renal: ESRD T/Th/S HD. Anuric @ baseline - CRRT without fluid removal- part of bath is 0K, will need to adjust to 4K when potassium nears 4 or high 3sGI: hx of ischemic colitis (care everywhere), cholecystitis s/p C-tube placement- NG to LIWS, clamp today.  Wife considering if his advanced directive would allow for temporary nutrition (did not want feeding tube, but reflecting on if he would have meant long term or as an absolute)- GI prophylaxis: Yes; PPI - Last BM:  9/13Heme: metastatic RCC, chronic anemic-  prophylactic enoxEndo: Adrenal Insufficiency on Florinef  & Dexamethasone .    -On Florinef  for hypotension.- Initiated on Hydrocort  50 mg q6h - Hold dexamethasone  while on stress dose steroids MSK: right humeral fracture, diffuse spine mets- immobilized for now, further ortho assessment/planning after acute medical stablizationNeuro/Psych: hypoactive deliriumSocial: - HCP/SurrogateBoby, Eyer, NEVADA - Last provider update: 9/12 - GOC addressed this ICU admission? Yes; DNR/DNI as of this admission given tradjectory Medication Reconciliation Completed: Yes Code Status: DNR/DNI Plan of care discussed with: nursing, attending Diagnoses for this hospitalization include: Septic shock, metastatic renal cell  carcinoma Dispo: Pending clinical improvement [name], NP Pulmonary/Critical Care MedicineI spent 41 cumulative minutes, independent of procedures, in attention  to this critically ill patient who has 1 or more organ system failure and a high probability of imminent or life-threatening deterioration.

## 2023-11-24 NOTE — Plan of Care (Signed)
 Problem: SafetyGoal: Patient will remain free of fallsOutcome: MaintainingGoal: Prevent any intentional injuryOutcome: Maintaining Problem: Pain/ComfortGoal: Patient's pain or discomfort is manageableOutcome: Maintaining Problem: MobilityGoal: Patient's functional status is maintained or improvedOutcome: Maintaining Problem: Cognitive functionGoal: Cognitive function will be maintained or return to baselineDescription: Interventions:Delirium AssessmentLIVEBAR AssessmentOutcome: MaintainingGoal: Lines and tethers will be removed as appropriateOutcome: MaintainingGoal: Patient maintains appropriate nutritional intakeOutcome: MaintainingGoal: Vital signs will be within normal limitsOutcome: MaintainingGoal: Evidence for potential causes of delirium will be managedOutcome: MaintainingGoal: Behaviors will return to baselineOutcome: MaintainingGoal: Ambulation and mobility will be maintainedOutcome: MaintainingGoal: Retention of urine and constipation will be managedOutcome: Maintaining Problem: Risk for Impaired Sleep/Wake CycleGoal: The patient will maintain an adequate sleep/wake cycleOutcome: Maintaining Charmaine, RN

## 2023-11-24 NOTE — Interval H&P Note (Signed)
 UPDATES TO PATIENT'S CONDITION on the DAY OF SURGERY/PROCEDUREI. Updates to Patient's Condition (to be completed by a provider privileged to complete a H&P, following reassessment of the patient by the provider):Day of Surgery/Procedure Update:HistoryHistory reviewed and no changePhysicalPhysical exam updated and no change  II. Procedure Readiness I have reviewed the patient's H&P and updated condition. By completing and signing this form, I attest that this patient is ready for surgery/procedure.III. Attestation I have reviewed the updated information regarding the patient's condition and it is appropriate to proceed with the planned surgery/procedure.Calene Paradiso FORBES RUTH, MD as of 8:34 PM 11/24/2023

## 2023-11-24 NOTE — Provider Consult (Signed)
 Critical Care Nephrology Attending Progress Note:  11/24/2023 1:01 PMName: Shane Pearson       EMRN: [id number] DOB: Jul 02, 1960          Length of Stay:  11_________________________________________________________________________________Overall Impression and Recommendations:  63 y.o. male currently with with ESRD on HD TTS at [facility], stage IV carcinoma (suspected to be renal cell) with intracranial and spinal mets, CHF, HLD, GERD and ischemic colitis who presented to [facility] on 9/3 from urgent care due to a mechanical fall resulting in right humerus fracture. Ortho being consulted. Nephrology consulted for ESRD and provision of dialysis. End Stage Kidney Disease on outpatient HD - now requiring CVVHDFCVVHDF  -  seen and examined during hemodialysis treatment- currently on non-citrate CVVHDF, no UF due to pressor requirements- BFR=200, pre-dilution=800, effluent rate = 23 ml/kg/hour- continue CVVHDF for nowHyperkalemia -  resolved with dialysisMetabolic Acidosis - persists with elevated lactateCa/Phos - within acceptable limitsPlease see below for supporting details_________________________________________________________________________________Following for management of acute kidney injury and /or provision of hemodialysis.  Patient seen and examined, records reviewed including medications, labs, imaging, and progress notes.  I have also discussed relevant management issues with the team caring for the patient and the Nephrology ICU Team.  Total care time is exclusive of any teaching or procedures.Events:  Remains on NC-CVVHDFExam:  On exam, the patient is resting with nasal cannula oxygen.  Vitals: BP 106/64 (BP Location: Left arm)   Pulse 70   Temp 36.1 C (96.9 F) (Axillary)   Resp 17   Ht 1.753 m (5' 9)   Wt 77.6 kg (171 lb)   SpO2 100%   BMI 25.25 kg/m  .  Cardiac exam shows regular rate, no rub.  The lungs are clear anteriorly.   The  extremities are with edema.Relevant Renal Data:I/O last 3 completed shifts:09/13 0700 - 09/14 0659In: 2666.8 (34.4 mL/kg) [I.V.:1552.9 (0.8 mL/kg/hr); IV Piggyback:1113.9]Out: 659 (8.5 mL/kg) [Emesis/NG output:625]Net: 2007.8Weight: 77.6 kg    Recent Labs Lab 09/13/252334 09/13/251425 09/13/250517 09/12/251422 09/12/250847 09/09/252352 09/09/251237 09/08/252324 Sodium 135 136 135   < >  --    < > 136 131* Potassium 4.5 4.6 5.0   < > 5.8*   < > 4.9 5.2* Chloride 98 97 96   < >  --    < > 94* 91* CO2 16* 16* 19*   < >  --    < > 19* 20 UN 37* 47* 57*   < >  --    < > 35* 55* Creatinine 2.95* 3.69* 4.43*   < >  --    < > 4.72* 6.80* Glucose 110* 92 130*   < >  --    < > 68 97 Albumin 2.9* 3.0* 3.1*   < > 3.0*  --  3.0* 3.1* ALT  --   --   --   --  <5  --  6 <5 AST  --   --   --   --  35  --  72* 72*  < > = values in this interval not displayed. No components found with this basename: PROT, BILITOT, ALKPHOS Recent Labs Lab 09/13/252334 09/13/251756 09/13/251425 09/13/250517 09/13/250016 09/12/252218 09/12/252217 09/09/252352 09/09/251237 09/09/250030 09/08/252325 09/08/252324 Calcium  9.0  --  9.0 9.2 8.8   < >  --    < > 8.1*  --   --  8.4* Albumin 2.9*  --  3.0* 3.1*  --    < >  --    < > 3.0*  --   --  3.1* Phosphorus 3.7  --  4.4 4.9* 5.2*   < >  --    < > 4.5  --   --  5.9* ICA @7 .4,WB  --  4.7*  --  4.6*  --   --  4.5*   < >  --   --    < >  --  Lactate  --   --   --   --   --   --   --   --  1.9 2.6*  --  3.3* Lactate ART,WB  --  2.4*  --  3.3*  --   --  2.8*   < >  --   --   --   --  Magnesium  2.2  --   --  2.4 2.2  --   --    < > 1.8  --   --  1.9  < > = values in this interval not displayed.  Recent Labs Lab 09/13/252334 09/13/250016 09/12/251422 09/11/252349 09/10/252322 09/10/250205 09/09/251435 09/09/251304 09/09/251237 09/08/252324 09/08/251214 WBC 39.1* 35.2*  --   25.8*   < > 21.6*  --   --    < > 12.6* 13.9* Hemoglobin 9.9* 10.5*  --  9.7*   < > 10.1*  --   --    < > 9.7* 9.8* Hematocrit 33* 35*  --  33*   < > 34*  --   --    < > 32* 33* Platelets 612* 688*  --  675*   < > 729*  --   --    < > 633* 607* INR  --   --   --   --   --  2.0*  --   --   --  1.5* 1.4* aPTT  --   --  61.1*  --   --   --  38.0* >200.0*  --   --   --   < > = values in this interval not displayed. No components found with this basename: NEUTOPHILPCT, MONOPCT, EOSPCT Plan:  See above for succinct recommendationsAuthor: GLADIS BRAKE, MD,PhD  as of: 11/24/2023  at: 1:01 PM

## 2023-11-24 NOTE — Progress Notes (Signed)
 Tobramycin  IV  (maintenance dosing) - Pharmacist to DosePatient is receiving tobramycin  and is being treated for Empiric Therapy/Sepsis. Today is day 3 of therapy.Goal tobramycin  peak is 16-20 mcg/mL.  Goal tobramycin  trough is <0.5 mcg/mL.Laboratory DataSCr:   Lab results: 09/13/252334 09/13/251425 09/13/250517 Creatinine 2.95* 3.69* 4.43* BUN:   Lab results: 09/13/252334 09/13/251425 09/13/250517 UN 37* 47* 57*   Lab results: 09/14/250522 09/12/251758 Tobramycin  Peak  --  15.2* Tobramycin  Level 2.8  --   Assessment and PlanConsidering patient's renal function, volume status, clinical status, and reported concentrations, no dose needed at this time.Next tobramycin  concentration 11/25/23 at 0600Pharmacist will follow for changes in renal function, toxicity, and efficacy and order serum concentrations and creatinine as needed.For questions contact pharmacy at extension [phone] Elby, PharmD

## 2023-11-24 NOTE — Progress Notes (Signed)
 Interventional Radiology Pre-Procedure Handoff/ChecklistNPO:  YesTube feed Stopped: N/AAnticoagulants:noneTelemetry: Lynette mode: Bed  Number of Transporters needed: 2  Hover Mat:  YesIs patient changed into a hospital gown? YesReminded floor RN to have patient remove jewelry and leave valuables on the unit: NoIV access: Hemodialysis Catheter:  Tunneled Double lumen Left Internal Jugular (Active) Insertion Site Description Clean;Dry 11/24/23 1200 Phlebitis Scale Grade 0 11/24/23 1639 Infiltration Scale Grade 0 11/24/23 1639 Proximal Lumen Status Infusing 11/24/23 1410 Distal Lumen Status Flushed;Capped 11/24/23 1639 Dressing Type Transparent CHG 11/24/23 1200 Dressing Status Clean, dry, and intact 11/24/23 1200 Dressing Change Changed 11/21/23 1532 (T)Transparent Drsg Change- (Every 7 Days) Dressing changed 11/21/23 1532 Dressing Change Due 11/28/23 11/24/23 1200 Needleless Access Device(s) Changed? No 11/24/23 1200 Needleless Access Device(s) Change Due 11/21/23 11/21/23 1532 Heparin /TPA Dwell Yes - Heparin  11/24/23 1639 Freq of line accesses and lab draws addressed? Yes 11/24/23 1200 Extravasation No 11/24/23 1200 Comments dressing c/d/i no s/s of infection 11/14/23 1710   TCVC (Tunneled) 11/18/23 Left Femoral (Active) Insertion Site Description Clean;Dry 11/24/23 1200 Phlebitis Scale Grade 0 11/24/23 1200 Infiltration Scale Grade 0 11/24/23 1200 Proximal Lumen Status Infusing 11/24/23 1200 Medial 1 Lumen Status Infusing 11/24/23 1200 Distal Lumen Status Infusing 11/24/23 1200 Dressing Type Transparent CHG 11/24/23 1200 Dressing Status Clean, dry, and intact 11/24/23 1200 Dressing Change Changed 11/23/23 2200 (T)Transparent Drsg Change- (Every 7 Days) Dressing changed 11/23/23 2200 Dressing Change Due 11/30/23 11/24/23 1200 Bag to hub changed? No 11/24/23 1200 Next bag to hub change due 11/25/23 11/24/23 1200 Needleless  Access Device(s) Changed? No 11/24/23 1200 Needleless Access Device(s) Change Due 11/25/23 11/24/23 1200 **Need for continuing central line addressed? Critically ill;Medically indicated 11/24/23 1200 Extravasation No 11/24/23 1200 Respiratory: O2 Nasal Cannula 8 litersConsentable:noAlert and Oriented to person, place and time?: noCode Status: DNR/DNIDoes patient wear an insulin  pump? NoPrecautions:noneAllergies: Allergies[1]Shane Pearson Kleine, RN Received Handoff Report from 636RN for IR procedure 7:44 PM [1] AllergiesAllergen Reactions  Penicillins Swelling and Rash   Facial swellingMed History Tech discussed Penicillin allergy with patient on 10/18/23. Please see Antimicrobial Stewardship Chart Note for additional details.  Amoxicillin Swelling and Rash   Facial swellingMed History Tech discussed Amoxicillin allergy with patient on 10/18/23. Please see Antimicrobial Stewardship Chart Note for additional details.  Baclofen Other (See Comments)   confusion  Coconut Nausea And Vomiting  No Known Latex Allergy

## 2023-11-24 NOTE — Progress Notes (Signed)
 MICU Additional Critical Care Time NoteAfter rounds, further discussion with wife on nutrition, shock and overall prognosis.  She decided to speak with family to decide on whether we should switch to CMO.  While she was away discussing with family, Shane Pearson's vasopressors went up and he had two large bouts of hematochezia with sharp rise in vasopressors.  MTP initiated and blood pressure stabilized enough to travel to CT for emergent angio which reveled cecal bleed and no signs of perforation.  IR consulted and plan for embolization tonight.  Prognosis and code status reviewed with wife who would like to continue life sustaining measures with the exception of CPR, intubation, and enteral nutrition of any kind.  Dr. Cyrena aware.Total time at bedside managing new hemorrhagic shock and in family consulting totaled 220 minutes in addition to routine critical care time.Shane Pearson, NPPulmonary/CCM

## 2023-11-24 NOTE — Progress Notes (Signed)
 Report Given ToFeliz, RNActive Issues / Relevant Events Nursing note: MICU 0700-1900Patient normothermic. Gave PRN dilaudid  x1 in AM for pain with positive effect. Clamped NG. Titrated levo down to 20 as tolerated. At 1200 turn, noted to be stooling large amounts of blood, providers made aware. Returned NG to [employer]. Began to titrate levo up to maintain SBP >90 per verbal provider orders at bedside. Gave bicarb x1 and CaCl x1. Calcium  gluconate given. Around 1400, returned blood from CRRT to patient with levo at 74 and vaso on at 0.04. Spouse notified providers and nursing at bedside of large amount of blood leaking off the bed. Mass transfusion protocol started- see flowsheets for further detail. Titrated levo down during MTP in attempt to keep levo </= 10 and vaso at 0.02. Gave 1mg  dilaudid  per provider order and started dilaudid  gtt at 1, boluses given x2 and rate changed to 2. Once stabilized, traveled to CT for CT angio chest and abdomen/pelvis. Awaiting review by IR. Awaiting read of CT and possible procedure prior to restarting CRRT. Returned from CT and noted agonal/bradypnea and unresponsive. Shut off dilaudid  gtt. PRN narcan  given x2 with positive effect-provider at bedside aware. On 8L med flow and NRB. Please see Doc flow sheet for VS, assessment detail, and nursing interventions.Charmaine, RN

## 2023-11-24 NOTE — Procedures (Signed)
 Procedure Report Time out documentation completed prior to procedure:  YesIndications/Pre-Procedure diagnosis:  acute GIB with hemorrhagic shockProcedure performed:  Visceral angiographyGuide Wire(s) removed and inspected for integrity:YesFindings/Procedure Summary (detailed report located in the Image tab): -successful angiography of the celiac axis, superior mesenteric, and inferior mesenteric arteries-selective angiography of the right colic and ileocolic arteries-no angiographic evidence of ongoing arterial bleeding-lay flat with leg straight for two hours Complications:  noneCondition:  guardedEBL: <10ccSpecimens:  noOperators: Dr. Alm Ruth and [name], MDDisposition:  inpatientPost-Procedure Diagnosis: Rosalyn Sensing, MD  11/24/2023  10:11 PM

## 2023-11-24 NOTE — Progress Notes (Signed)
 Mass Transfusion protocol initiated. Total PRBC given: 13 unitsTotal plasma given: 6 unitsPlatelets: 1 unitCyro: 1 unitVerified blood products with Lejda Preza, RNCECELIA Kaysia Willard, RN

## 2023-11-25 ENCOUNTER — Inpatient Hospital Stay

## 2023-11-25 ENCOUNTER — Other Ambulatory Visit: Payer: Self-pay

## 2023-11-25 LAB — ART GASES / WHOLE BLOOD PANEL
Base Excess, Arterial: -8 mmol/L — ABNORMAL LOW (ref <=2)
Base Excess, Arterial: -9 mmol/L — ABNORMAL LOW (ref <=2)
CO2,ART (Calc): 17 mmol/L — ABNORMAL LOW (ref 23–28)
CO2,ART (Calc): 19 mmol/L — ABNORMAL LOW (ref 23–28)
CO: 2.5 %
CO: 2.2 %
Chloride,WB: 101 mmol/L (ref 96–108)
Chloride,WB: 101 mmol/L (ref 96–108)
FO2 Hb, Arterial: 93 % (ref 90–100)
FO2 Hb, Arterial: >96 % (ref 90–100)
Glucose,WB: 135 mg/dL — ABNORMAL HIGH (ref 60–99)
Glucose,WB: 141 mg/dL — ABNORMAL HIGH (ref 60–99)
HCO3, Arterial: 16 mmol/L — ABNORMAL LOW (ref 21–26)
HCO3, Arterial: 18 mmol/L — ABNORMAL LOW (ref 21–26)
Hemoglobin: 18.4 g/dL — ABNORMAL HIGH (ref 12.0–17.0)
Hemoglobin: 18.6 g/dL — ABNORMAL HIGH (ref 12.0–17.0)
ICA @7.4,WB: 4.7 mg/dL — ABNORMAL LOW (ref 4.8–5.2)
ICA @7.4,WB: 4.6 mg/dL — ABNORMAL LOW (ref 4.8–5.2)
ICA Uncorr,WB: 4.8 mg/dL (ref 4.8–5.2)
ICA Uncorr,WB: 4.9 mg/dL (ref 4.8–5.2)
Lactate ART,WB: 3.4 mmol/L — ABNORMAL HIGH (ref 0.3–0.8)
Lactate ART,WB: 3 mmol/L — ABNORMAL HIGH (ref 0.3–0.8)
Methemoglobin: 1.5 % — ABNORMAL HIGH (ref 0.0–1.0)
Methemoglobin: 0.8 % (ref 0.0–1.0)
NA, WB: 130 mmol/L — ABNORMAL LOW (ref 135–145)
NA, WB: 131 mmol/L — ABNORMAL LOW (ref 135–145)
Potassium,WB: 3.2 mmol/L — ABNORMAL LOW (ref 3.3–4.6)
Potassium,WB: 3.8 mmol/L (ref 3.3–4.6)
pCO2, Arterial: 30 mmHg — ABNORMAL LOW (ref 35–46)
pCO2, Arterial: 40 mmHg (ref 35–46)
pH: 7.33 — ABNORMAL LOW (ref 7.35–7.45)
pH: 7.25 — ABNORMAL LOW (ref 7.35–7.45)
pO2,Arterial: 73 mmHg — ABNORMAL LOW (ref 80–100)
pO2,Arterial: 179 mmHg — ABNORMAL HIGH (ref 80–100)

## 2023-11-25 LAB — RENAL FUNCTION PANEL
Albumin: 3.1 g/dL — ABNORMAL LOW (ref 3.5–5.2)
Albumin: 3.1 g/dL — ABNORMAL LOW (ref 3.5–5.2)
Anion Gap: 18 — ABNORMAL HIGH (ref 7–16)
Anion Gap: 23 — ABNORMAL HIGH (ref 7–16)
CO2: 18 mmol/L — ABNORMAL LOW (ref 20–28)
CO2: 15 mmol/L — ABNORMAL LOW (ref 20–28)
Calcium: 8.8 mg/dL (ref 8.6–10.2)
Calcium: 9.1 mg/dL (ref 8.6–10.2)
Chloride: 101 mmol/L (ref 96–108)
Chloride: 100 mmol/L (ref 96–108)
Creatinine: 2.4 mg/dL — ABNORMAL HIGH (ref 0.67–1.17)
Creatinine: 2.2 mg/dL — ABNORMAL HIGH (ref 0.67–1.17)
Glucose: 127 mg/dL — ABNORMAL HIGH (ref 60–99)
Glucose: 132 mg/dL — ABNORMAL HIGH (ref 60–99)
Lab: 31 mg/dL — ABNORMAL HIGH (ref 6–20)
Lab: 31 mg/dL — ABNORMAL HIGH (ref 6–20)
Phosphorus: 4.5 mg/dL (ref 2.7–4.5)
Phosphorus: 3.6 mg/dL (ref 2.7–4.5)
Potassium: 3.7 mmol/L (ref 3.3–5.1)
Potassium: 3.7 mmol/L (ref 3.3–5.1)
Sodium: 137 mmol/L (ref 133–145)
Sodium: 138 mmol/L (ref 133–145)
eGFR BY CREAT: 29 — AB
eGFR BY CREAT: 33 — AB

## 2023-11-25 LAB — DIC PROFILE
D-Dimer: 6.54 ug{FEU}/mL — ABNORMAL HIGH (ref 0.00–0.50)
D-Dimer: 3.84 ug{FEU}/mL — ABNORMAL HIGH (ref 0.00–0.50)
Fibrinogen: 551 mg/dL — ABNORMAL HIGH (ref 172–409)
Fibrinogen: 384 mg/dL (ref 172–409)
Hematocrit: 29 % — ABNORMAL LOW (ref 37–52)
Hematocrit: 51 % (ref 37–52)
Hemoglobin: 8.5 g/dL — ABNORMAL LOW (ref 12.0–17.0)
Hemoglobin: 16.7 g/dL (ref 12.0–17.0)
INR: 4.1 — ABNORMAL HIGH (ref 0.9–1.1)
INR: 1.5 — ABNORMAL HIGH (ref 0.9–1.1)
Platelets: 524 THOU/uL — ABNORMAL HIGH (ref 150–450)
Platelets: 204 THOU/uL (ref 150–450)
Protime: 47.1 s — ABNORMAL HIGH (ref 10.0–12.9)
Protime: 17.4 s — ABNORMAL HIGH (ref 10.0–12.9)
aPTT: 58.1 s — ABNORMAL HIGH (ref 25.8–37.9)
aPTT: 41.3 s — ABNORMAL HIGH (ref 25.8–37.9)

## 2023-11-25 LAB — CBC AND DIFFERENTIAL
Baso # K/uL: 0 THOU/uL (ref 0.0–0.2)
Eos # K/uL: 0 THOU/uL (ref 0.0–0.5)
Hematocrit: 53 % — ABNORMAL HIGH (ref 37–52)
Hemoglobin: 17.7 g/dL — ABNORMAL HIGH (ref 12.0–17.0)
Lymph # K/uL: 0.3 THOU/uL — ABNORMAL LOW (ref 1.0–5.0)
MCV: 85 fL (ref 75–100)
Mono # K/uL: 2 THOU/uL — ABNORMAL HIGH (ref 0.1–1.0)
Nucl RBC # K/uL: 2.2 THOU/uL — ABNORMAL HIGH (ref 0.0–0.1)
Nucl RBC # K/uL: 23.2 THOU/uL — ABNORMAL HIGH (ref 1.5–6.5)
Nucl RBC %: 8.4 /100{WBCs} — ABNORMAL HIGH (ref 0.0–0.2)
Platelets: 214 THOU/uL (ref 150–450)
RBC: 6.2 MIL/uL — ABNORMAL HIGH (ref 4.0–6.0)
RDW: 19.1 % — ABNORMAL HIGH (ref 0.0–15.0)
Seg Neut %: 88.8 /100{WBCs} — AB (ref 0.0–0.2)
WBC: 26.1 THOU/uL — ABNORMAL HIGH (ref 3.5–11.0)

## 2023-11-25 LAB — BODY FLUID CELL COUNT
Nucl Cell,FL: 561 /uL
RBC,FL: 314000 /uL

## 2023-11-25 LAB — DIFF MANUAL
Diff Based On: 117 {cells}
Metamyelocyte %: 1 % (ref 0–1)
Myelocyte %: 2 % — ABNORMAL HIGH

## 2023-11-25 LAB — TOBRAMYCIN LEVEL, RANDOM: Tobramycin Level: 1.7 ug/mL

## 2023-11-25 LAB — MAGNESIUM: Magnesium: 2 mg/dL (ref 1.6–2.5)

## 2023-11-25 LAB — PATH REVIEW,FL

## 2023-11-25 LAB — SPEC COAG REVIEW

## 2023-11-25 LAB — ANAEROBIC BACTERIAL WOUND OR DRAINAGE CULTURE: Anaerobic bacterial wound or drainage culture: 0

## 2023-11-25 MED ORDER — MIDAZOLAM HCL 1 MG/ML IJ SOLN *I* WRAPPED
0.5000 mg | INTRAMUSCULAR | Status: DC | PRN
Start: 2023-11-25 — End: 2023-11-28
  Administered 2023-11-26 (×3): 0.5 mg via INTRAVENOUS
  Filled 2023-11-25 (×3): qty 2

## 2023-11-25 MED ORDER — ALTEPLASE 2 MG IJ SOLR *I*
2.0000 mg | Freq: Once | INTRAMUSCULAR | Status: AC
Start: 2023-11-25 — End: 2023-11-25
  Administered 2023-11-25: 2 mg
  Filled 2023-11-25: qty 2

## 2023-11-25 MED ORDER — HYDROMORPHONE HCL PF 1 MG/ML IJ SOLN *WRAPPED*
0.5000 mg | INTRAMUSCULAR | Status: DC | PRN
Start: 2023-11-25 — End: 2023-11-28
  Administered 2023-11-26 (×5): 0.5 mg via INTRAVENOUS
  Filled 2023-11-25 (×5): qty 0.5

## 2023-11-25 MED ORDER — PRISMASATE BGK 4/2.5 DIALYSATE SOLUTION *I*
800.0000 mL/h | INTRAVENOUS_CENTRAL | Status: DC
Start: 2023-11-25 — End: 2023-11-25
  Administered 2023-11-25 (×3): 800 mL/h via INTRAVENOUS_CENTRAL

## 2023-11-25 NOTE — Progress Notes (Signed)
 Medical ICU Progress Note LOS: 12 Days Interval History/Subjective: - Acute bleed from cecum. MTP, bleeding was no longer active by the time he went to IRObjective Vital Signs: BP 106/64 (BP Location: Left arm)   Pulse 89   Temp 35.6 C (96.1 F) (Axillary)   Resp 11   Ht 1.753 m (5' 9)   Wt 77.6 kg (171 lb)   SpO2 94%   BMI 25.25 kg/m  Physical ExamPhysical Exam General: in NADNeuro: lethargic, directs gaze, did not follow commands HEENT: NG, NC in place Pulm: lungs coarse Cardiac: s1s2GI: abdomen soft, NT/ND GU: aneuric Extremities: wwp Integument: wwp  Assessment: Shane Pearson is a 63 y.o. 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  Source of sepsis appeared to be biliary s/p perc cholecystostomy tube 9/10 with Enterococcus however blood cultures were not positive and he remains on dual vasopressors after drain placement giving concern for another process as subsequent imaging shows gallbladder is adequately drained by perc drain.Course further c/b acute lower GI bleed, found to be luminal in cecum on CT and required MTP, however no active bleeding when arrived in IR.He is DNR/DNI and no tube feeding of any kind.  Wife asked for another week 9/14 before considering CMO.Plan: Pulmonary: acute pulmonary insufficiency, malignant effusion- Nasal cannula needs increased after MTP, vasopressors preclude de-resuscitation- thoracentesis 7/22 malignant, repeated 9/12 with ongoing sepsis and still exudative with only 1-10 PMNs and no growthCardiovascular: hemorrhagic shock resolved, septic shock continues, hx of  chronic hypotension and follows with  regional cardiology without cause identifiedEcho 9/10 hyperdynamic , adequate response stress test in july- On midodrine  10 TID at home- NE at 16, vaso at 0.04 with goal SBP 90ID: septic shock with occult source, enterococcal cholecystitis- appreciate ID input, abx broadened 9/12 from cefepime /flagyl /linezolid  to meropenem /linezolid , caspo, tobra; d/w pharmacy will redose tobra today but need to start narrowing with ID given inability to find new sources of infection driving shock- C-diff negative- shoulders without fluid collection to tap- [facility] 9/8, 9/9, 9/12 NGTD- IR cultures of GB 9/10 with pan sensitive E-faecalis Renal: ESRD T/Th/S HD. Anuric @ baseline - CRRT without fluid removal- part of bath is 0K, will need to adjust to 4K when potassium nears 4 or high 3sGI: cecal bleed with dilation, hx of ischemic colitis (care everywhere), cholecystitis s/p C-tube placement.  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  & Dexamethasone   - On Florinef  for hypotension chronically- Hydrocort  50 mg q6h - Hold dexamethasone  while on stress dose steroids MSK: right humeral fracture, diffuse spine mets- immobilized for now, further ortho assessment/planning after acute medical stablizationNeuro/Psych: hypoactive delirium, acute pain 2/2 GI bleed- hydromorphone  0.25 q1, did require naloxone  9/14 for requiring multiple boluses during hemorrhagic shock episode, but at higher doses.Social: -  HCP/Surrogate: Shane Pearson, Shane Pearson, Shane Pearson - Last provider update: 9/14; discussed it would be  appropriate to switch to comfort approach at any time.  After discussing with family she asked for another week.  New hemorrhagic shock did not change this for her.- GOC addressed this ICU admission? Yes; DNR/DNI as of this admission given tradjectory Medication Reconciliation Completed: Yes Code Status: DNR/DNI Plan of care discussed with: nursing, attending Diagnoses for this hospitalization include: Septic shock, metastatic renal cell carcinoma Dispo: Pending clinical improvement [name], NP Pulmonary/Critical Care MedicineI spent 71 cumulative minutes, independent of procedures, in attention to this critically ill patient who has 1 or more organ system failure and a high probability of imminent or life-threatening deterioration.

## 2023-11-25 NOTE — Progress Notes (Signed)
 [facility] continues to follow this patient's hospital course for home care needs.  Please call for questions/concerns regarding the discharge plan.        [name], RN for Shane Eth RN  [facility]  [phone]  After hours and on weekends/Holidays [phone]

## 2023-11-25 NOTE — Plan of Care (Signed)
Problem: Safety  Goal: Patient will remain free of falls  Outcome: Maintaining  Goal: Prevent any intentional injury  Outcome: Maintaining     Problem: Pain/Comfort  Goal: Patient's pain or discomfort is manageable  Outcome: Maintaining     Problem: Mobility  Goal: Patient's functional status is maintained or improved  Outcome: Maintaining     Problem: Cognitive function  Goal: Cognitive function will be maintained or return to baseline  Description: Interventions:  Delirium Assessment  LIVEBAR Assessment    Outcome: Maintaining  Goal: Lines and tethers will be removed as appropriate  Outcome: Maintaining  Goal: Patient maintains appropriate nutritional intake  Outcome: Maintaining  Goal: Vital signs will be within normal limits  Outcome: Maintaining  Goal: Evidence for potential causes of delirium will be managed  Outcome: Maintaining  Goal: Behaviors will return to baseline  Outcome: Maintaining  Goal: Ambulation and mobility will be maintained  Outcome: Maintaining  Goal: Retention of urine and constipation will be managed  Outcome: Maintaining     Problem: Risk for Impaired Sleep/Wake Cycle  Goal: The patient will maintain an adequate sleep/wake cycle  Outcome: Maintaining

## 2023-11-25 NOTE — Plan of Care (Signed)
 Interventional Radiology Progress NoteAssessment/Plan: Shane Pearson is a 63 y.o. male with history of cecal bleeding s/p angiogram without active bleeding with right groin access on 11/25/23. Access site dressing is clean and dry and there is no hematoma. H&H is stable at this time.IR will sign off at this time.If any concern for rebleeding please contact the Angio/Interventional first call pager.[name], MD===================================================S:Intubated, sedated. O:Temp:  [35.2 C (95.4 F)-36.8 C (98.2 F)] Temp src: Axillary (09/15 1500)Heart Rate:  [70-114] Resp:  [7-25] SpO2:  [77 %-100 %] General: sick appearingCardiac: tachycardicAbdomen: nondistendedAccess site, right groin: dressing clean, dry, intact. Site non tender. Intake/Output Summary (Last 24 hours) at 11/25/2023 1514Last data filed at 11/25/2023 1459Gross per 24 hour Intake 7867.23 ml Output 463 ml Net 7404.23 ml Labs:  Lab results: 09/14/252324 WBC 26.1* Hemoglobin 17.7* Hematocrit 53* RBC 6.2* Platelets 214   Lab results: 09/15/251153 09/12/251422 09/12/250847 Sodium 138   < >  --  Potassium 3.7   < > 5.8* Chloride 100   < >  --  CO2 15*   < >  --  UN 31*   < >  --  Creatinine 2.20*   < >  --  Glucose 132*   < >  --  Calcium  9.1   < >  --  Total Protein  --   --  5.8* Albumin 3.1*   < > 3.0* ALT  --   --  <5 AST  --   --  35 Alk Phos  --   --  254* Bilirubin,Total  --   --  0.2  < > = values in this interval not displayed.   Lab results: 09/14/251740 INR 1.5* Imaging:ReviewedMedications:Current Facility-Administered Medications Medication Dose Route Frequency Last Admin  PrismaSATE BGK 4/2.5 dialysate solution  800 mL/hr CRRT Continuous 800 mL/hr at 11/25/23 1315  HYDROmorphone  PF (DILAUDID ) injection 0.25 mg  0.25 mg Intravenous Q1H PRN 0.25 mg at 11/25/23 1347   pantoprazole  (PROTONIX ) 4 mg/ml injection 40 mg  40 mg Intravenous Q12H 40 mg at 11/25/23 9367  linezolid  (ZYVOX ) IVPB 600 mg  600 mg Intravenous Q12H Stopped at 11/25/23 1257  vasopressin  (PITRESSIN) 0.4 units/mL in sodium chloride  0.9% 112.2 mL continuous infusion  0.01-0.04 Units/min Intravenous Continuous 0.04 Units/min at 11/25/23 1459  PrismaSol  BGK 4/2.5- PBP Fluid (WHITE LINE ONLY)  800 mL/hr CRRT Continuous 800 mL/hr at 11/25/23 1221  PrismaSOL  BGK 4/2.5 Replacement Solution (PURPLE LINE ONLY)  200 mL/hr CRRT Continuous 200 mL/hr at 11/25/23 1227  meropenem  (MERREM ) 500mg  in sodium chloride  0.9% 58 mL Mini-Bag Plus  500 mg Intravenous Q6H Stopped at 11/25/23 1447  caspofungin  (CANCIDAS ) 70 mg in sodium chloride  0.9 % 285 mL IVPB  70 mg Intravenous Q24H Paused at 11/24/23 2340  Tobramycin  - Pharmacist to Dose (for admitted patients only)   Intravenous Pharmacist Managed Medication    norepinephrine  (LEVOPHED ) 8 mg (32 mcg/mL) in NS 250 mL continuous infusion  1-80 mcg/min Intravenous Continuous 20 mcg/min at 11/25/23 1459  hydrocortisone  sodium succinate  PF (Solu-CORTEF ) 50 mg/mL injection 50 mg  50 mg Intravenous Q6H 50 mg at 11/25/23 1155  diphenydraMINE-lidocaine -maalox (BMX/First Mouthwash) compound suspension  15 mL Mouth/Throat Q4H PRN    melatonin tablet 3 mg  3 mg Oral QHS PRN    heparin  (porcine) 1,000 units/mL injection 0-5,000 units  0-5 mL Intracatheter PRN 1.6 mL at 11/22/23 0756  heparin  (porcine) 1,000 units/mL injection 0-5,000 units  0-5 mL Intracatheter PRN 1.6 units at 11/24/23 1412  sodium chloride  0.9 % FLUSH  REQUIRED IF PATIENT HAS IV  0-500 mL/hr Intravenous PRN    dextrose  5 % FLUSH REQUIRED IF PATIENT HAS IV  0-500 mL/hr Intravenous PRN    naloxone  (NARCAN ) 0.4 mg/mL injection 0.08 mg  0.08 mg Intravenous PRN 0.08 mg at 11/24/23 1714 Problem List:Patient Active Problem List Diagnosis Code  ESRD (end stage renal disease) on dialysis N18.6,  Z99.2  HLD (hyperlipidemia) E78.5  Hypotension I95.9  Ischemic colitis K55.9  Allergic rhinitis J30.9  Arteriosclerosis of artery of extremity I70.209  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  Embolism due to vascular prosthetic devices, implants and grafts, initial encounter T82.818A  Spondylosis of lumbosacral region without myelopathy or radiculopathy M47.817  Diarrhea R19.7  Failure to thrive in adult R62.7  History of temporal artery biopsy Z98.890  Leg weakness R29.898  Other closed displaced fracture of proximal end of right humerus, initial encounter S42.291A  Closed displaced oblique fracture of shaft of right humerus, initial encounter S42.331A  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

## 2023-11-25 NOTE — Progress Notes (Signed)
 Critical Care Nephrology Progress Note: LOS: 12 days Subjective  Patient seen and examined in room. Remains on CRRT. Currently receiving Levo at 20 mcg/min and Vaso at 0.04. Family member at bedside and updated from a renal standpoint.     Objective   Current Vitals Vitals Range (24 Hours) BP 106/64 (BP Location: Left arm)   Pulse 71   Temp 36 C (96.8 F) (Axillary) Comment: warming blanket on  Resp 17   Ht 1.753 m (5' 9)   Wt 77.6 kg (171 lb)   SpO2 92%   BMI 25.25 kg/m  Temp:  [35.2 C (95.4 F)-36.1 C (96.9 F)] Temp src: Axillary (09/15 1200)Heart Rate:  [70-130] Resp:  [7-26] SpO2:  [77 %-100 %]  I/Os:I/O last 3 completed shifts:09/14 0700 - 09/15 0659In: 7795.8 (100.5 mL/kg) [I.V.:813.6 (0.4 mL/kg/hr); Blood:5520; IV Piggyback:1462.1]Out: 465 (6 mL/kg) [Emesis/NG output:450]Net: 7330.8Weight: 77.6 kg Weight:Last 4 Weights  11/20/23 1100 Weight: 77.6 kg (171 lb) Exam:General: ill appearingCardiovascular: S1, S2, RegularPulmonary: increased WOBExt: There is +2 LE edema Access: HDTCNeurological: did not assessSkin: Clean, intact, there is no rash on visible skinLabs:Recent Labs Lab 09/15/251153 09/15/250237 09/14/252324 09/14/251740 09/14/251219 09/13/252334 09/13/251425 09/13/250517 Sodium 138  --  137 137   < > 135   < > 135 Potassium 3.7  --  3.7 3.7   < > 4.5   < > 5.0 Chloride 100  --  101 100   < > 98   < > 96 CO2 15*  --  18* 19*   < > 16*   < > 19* Anion Gap 23*  --  18* 18*   < > 21*   < > 20* UN 31*  --  31* 30*   < > 37*   < > 57* Creatinine 2.20*  --  2.40* 2.27*   < > 2.95*   < > 4.43* Glucose 132*  --  127* 113*   < > 110*   < > 130* Calcium  9.1  --  8.8 9.3   < > 9.0   < > 9.2 Phosphorus 3.6  --  4.5 5.1*   < > 3.7   < > 4.9* Magnesium   --  2.0  --   --   --  2.2  --  2.4  < > = values in this interval not displayed. Recent Labs Lab  09/14/252324 09/14/251740 09/14/251314 09/13/252334 09/13/250016 WBC 26.1*  --   --  39.1* 35.2* Hemoglobin 17.7* 16.7 8.5* 9.9* 10.5* Hematocrit 53* 51 29* 33* 35* Platelets 214 204 524* 612* 688* Recent Labs Lab 09/14/251740 09/14/251314 09/12/251422 09/10/250205 INR 1.5* 4.1*  --  2.0* aPTT 41.3* 58.1* 61.1*  --  No results for input(s): UAPP, UCOL, UAGLU, KETONESU, USG, UBLD, UAPH, UPRO, UNITR, ULEU, URBC, UWBC, UMUC, GLUCOSEUA, [place], SPECIFICGRAV, BLOODUA, PHUA, PROTEIN, NITRITEUA, LEUKESTERASE, GLUCOSESIE, KETONESIEM, PUSG, BLOODSIEMEN, PUAPH, PROTEINUA, PUNIT, LEUKOCYTESIE in the last 8760 hours.Aerobic bacterial pleural or thoracentesis fluid culture and stain Date Value Ref Range Status 11/22/2023 .  Preliminary 10/01/2023 Lab Cancel  Final Aerobic bacterial wound or drainage culture and stain Date Value Ref Range Status 11/20/2023 Enterococcus faecalis (!)  Final   Comment:   4+ Isolate identified by MALDI-TOFVancomycin restricted [facility]:  Requires approval ofInfectious Diseases (pager [phone]) or initial orderwill be discontinued at 72 hours. Anaerobic bacterial pleural or thoracentesis fluid culture Date Value Ref Range Status 11/22/2023 .  Preliminary Anaerobic bacterial wound or drainage culture Date Value Ref Range Status 11/20/2023 .  Final Bacterial Blood Culture  Date Value Ref Range Status 11/22/2023 .  Preliminary 11/19/2023 .  Final 11/19/2023 .  Final No results for input(s): JOLINDA PLEVA, [facility], [place], [place], [place], [facility]MO in the last 168 hours. Current Medications:Scheduled Meds: pantoprazole   40 mg Intravenous Q12H  linezolid   600 mg Intravenous Q12H  meropenem   500 mg Intravenous Q6H  caspofungin   70 mg Intravenous Q24H  hydrocortisone  IV  50 mg Intravenous Q6H Continuous Infusions:  vasopressin  0.04 Units/min (11/25/23 1159)  PrismaSATE BK  0/3.5 800 mL/hr (11/25/23 1224)  PrismaSOL  BGK 4/2.5 800 mL/hr (11/25/23 1221)  PrismaSOL  BGK 4/2.5 200 mL/hr (11/25/23 1227)  Tobramycin  - Pharmacist to Dose    NORepinephrine  25 mcg/min (11/25/23 1229) PRN Meds:.HYDROmorphone  hcl, DiphenhydrAMINE -Lidocaine -Maalox (BMX/First Mouthwash), melatonin, heparin  (porcine), heparin  (porcine), sodium chloride , dextrose , naloxoneImaging:Chest single frontal viewResult Date: 9/15/2025Interval increase in right lower lung opacities suggestive of increased pleural fluid and associated atelectasis. Left retrocardiac opacity appears worsened as compared to prior study and can be due to atelectasis/aspiration/pneumonia. Bilateral airspace opacities representing alveolar edema versus multifocal infection/inflammation. Bilateral small pleural effusions. END OF IMPRESSION I have personally reviewed the images and the Resident's/Fellow's interpretation and agree with or edited the findings. IR angiography visceralResult Date: 9/14/2025Angiography of the celiac axis, superior mesenteric, and inferior mesenteric arteries with additional superselective angiography of the right colic and ileocolic arteries demonstrates no angiographic evidence of acute GI bleeding. Plan: Lay flat for 2 hours. If clinical concern for GI bleeding remains, GI consult can be considered for colonoscopy. END OF IMPRESSION CT angio abdomen and pelvisResult Date: 9/14/2025Suboptimal angiographic phase images. There is active GI bleeding within the cecum. There is extensive clot throughout the right colon. The cecum is dilated and anteriorly located, the positioning is similar to prior imaging, but the dilation is new. This may represent a an evolving ileus or cecal bascule. Unchanged metastatic disease. Please see separately dictated chest CT report. Results were communicated to Nurse Practitioner LEE CASTELLANO  on  11/24/2023 5:18 PM   via eRecord Instant Messenger. END OF IMPRESSION CT angio chestResult Date: 9/14/2025Markedly suboptimal evaluation of the pulmonary arteries due to poor contrast opacification and significant cardiorespiratory motion artifact. No central pulmonary embolism is identified. The more peripheral pulmonary arterial vasculature is not well evaluated in this examination. Redemonstration of extensive sequelae of metastatic disease involving the chest, including bulky mediastinal lymphadenopathy, bilateral lung and pleural lesions, extensive osseous metastatic disease, and multiple soft tissue implants. Slight decrease of persistent moderate to large left-sided pleural fluid. No significant change in small loculated right-sided pleural fluid. Findings are compatible sequelae of malignant pleural disease. Increased extensive bilateral right greater than left mid to lower lung predominant peribronchial and peripheral groundglass and part solid opacification, likely infection/inflammation. Diffuse edema of the partially visualized bilateral shoulders without clearly identified focal fluid collections. Please note that the shoulders are incompletely evaluated in these examinations and if there is persistent clinical concern for significant fluid collections, further evaluation could be performed with ultrasound imaging. END REPORT Chest single frontal viewResult Date: 9/13/2025Decreased left pleural effusion status post thoracentesis. No pneumothorax. Persistent small right pleural effusion. Decreased left greater than right patchy and hazy opacities with improved aeration, likely reflecting improving pulmonary edema and atelectasis. Superimposed aspiration and/or pneumonia may be present in the appropriate clinical setting. END OF IMPRESSION I have personally reviewed the images and the Resident's/Fellow's interpretation and agree with or edited the findings. Portable US  abdomen limited  single quadResult Date: 9/12/2025Suboptimal evaluation due to patient condition and poor acoustic windows. Percutaneous cholecystostomy tube  partially visualized in the gallbladder lumen. The gallbladder is nondistended and contains heterogeneous hypoechoic material which may represent sludge and/or hemorrhage. The drain tip is not clearly visualized. Please also see CT performed earlier same day. No biliary duct dilatation. Increased right renal cortical echogenicity may represent medical renal disease. END OF IMPRESSION. *Chest STANDARD single viewResult Date: 9/12/2025Decreased moderate to large left pleural effusion with improved aeration of the left lung. No pneumothorax appreciated. Background edema/atelectasis noted with trace right pleural fluid. END OF IMPRESSION I have personally reviewed the images and the Resident's/Fellow's interpretation and agree with or edited the findings. Assessment Shane Pearson is a 63 y.o. 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 However, pt developed hypotension requiring vasopressor support and was transferred to MICU for septic shock. Plan 1. ESRD on HD TTS at [facility] -- Will continue CRRT in the setting of hemodynamic instability with the following prescription:Non-Citrate CVVHDF Parameters  Blood Flow Rate: 200 ml/min  Pre-Filter Solution (White Line): Prismasol  @ 800 ml/hr  Replacement Solution (Purple Line): Prisamsol @ 200 ml/hr  Dialysate (Green Line): Prismasate @ 800 ml/hr  UF: 0 ml/hr Dosing Weight: 77 Kg Total Effluent: 1800 ml/hr Effluent Dose: 23 ml/kg/hr  Heparin  Anticoagulation: No aPTT Goal: N/A, heparin  is NOT being used for CRRT anticoagulation  APTT Level: N/A, heparin  is NOT being used for CRRT anticoagulation - Continue  Nephrovite- Follow Chemistry- Dose adjust meds to CrCL <102. 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 Anemia- Hgb is above goal- No EPO- Transfuse per primary team- Follow CBC4. Lytes- K and Na are acceptable- I changed to all 4K solutions for borderline low K- Follow chemistry5. Acid Base-  Bicarb is low- Will manage metabolic component with CRRT-  Follow chemistry6. Volume/Blood Pressure- SBPs 80s-110s- Vasopressor Management per ICU team- Fluid removal is currently off due to hemodynamics7. Access - HDTCKelsey Fraser, NPElectronically Signed on 11/25/2023 at 12:52 PM

## 2023-11-25 NOTE — ACP (Advance Care Planning) (Signed)
 Pt's wife Tramane Gorum) & son requested to meet with me to discuss goals of care, specifically transitioning to comfort measures only. They asked what this would entail. I informed them that we would stop all treatment oriented care (antibiotics, CRRT, vasopressors, etc.) and focus on treating patients symptoms with pain and anxiety medications. [name] expressed that she had conversations with her husband where he expressed that he would not want extreme measures taken, and that she feels we have done all we can for him without improvement. She does not want him to suffer anymore, and therefore would like to honor his wishes and transition to Comfort Measure Only at this time. A new MOLST was filled out & placed in pt's chart. All questions were answered & emotional support provided. Chaplain services were offered, but family declined as pt's pastor had already visited. Attending provider notified.Damien LITTIE Remington, PAPulmonary Critical Care Medicine

## 2023-11-25 NOTE — Progress Notes (Signed)
 Report Given [place], RN + [place], RN Active Issues / Relevant Events Nursing note [phone]:Pt desat to 83, O2 pleath on monitor not great. Got ABG, showed 73- increased pt to 15L nasal cannula w/ non-rebreather mask. Removed tPA from medial + distal fem CVC, all lumens now flush and draw back. Levo gtt titrated to16mcg/min, vaso gtt at 0.04 units/min. CRRT restarted due to filter clotting. PrismaSATE BGK 4/2.5 dialysate solution started for CRRT. Blood flow decreased to 130 mL/min instead of 200 due to high pressure alarming. If CRRT clots again, plan is to tPA HD line per provider. 1x prn dilaudid  given for pain w/ positive effect. Perc drain remains in place. NG tube to LIWS -small bilious/red tinged output just in tubing this morning, no output since. Please see doc flowsheets and MAR for additional information and interventions.  [place], RN

## 2023-11-25 NOTE — Progress Notes (Signed)
 Critical Care Attending NoteAssessment and plan:Pulm - Acute hypoxic respiratory failure. Left malignant pleural effusion s/p thoracentesis 9/12.Medium flow oxygenCV/ID - Shock, suspected septic. Initially though biliary source but did not improve s/p percutaneous chole. E facaelis on 9/10. Bleeding in last 36 hrs has further complicated picture.Levophed  for goal MAP > 65 mmHgVasopressinStress dose steroidsMeropenem, linezolid , caspoAppreciate ID inputNeph - ESRD on HDCRRTHem/GI - GI bleed, suspected cecal bleeding based on CT scan. No active bleed when he went to IR overnight. IV PPISCDs for DVT ppxOnc - metastatic RCC with mets to spine, mediastinum, pleural spaceEndo - chronic adrenal insufficiency. Stress dose steroids as above.Neuro - diffuse painLow dose dilaudidWife updated at bedside. Family coming in from out of town. Prognosis is poor overall.________________________________________________________________Key historical elements and objective findings are recorded below.  The key elements of my assessment and plans are listed above.  ObjectiveBP 106/64 (BP Location: Left arm)   Pulse 72   Temp 35.6 C (96.1 F) (Axillary)   Resp 18   Ht 1.753 m (5' 9)   Wt 77.6 kg (171 lb)   SpO2 92%   BMI 25.25 kg/m  Answers some questions but unreliable and does not follow commands. Breath sounds coarse. Abdomen soft. Mild peripheral edema________________________________________________________________This patient was evaluated on rounds with [name].  I personally examined the patient.  All nursing documentation, laboratory data, test results, and radiographs were reviewed and interpreted by me.  I have established the management plan for this patient's critical illness and have been immediately available to assist with patient care.  I agree with the database, findings, and plan of care recorded in the NP's note.This patient is critically  ill with at least 1 organ system failure associated with a high probability of imminent or life threatening deterioration.  The care I have delivered involves high complexity decision making to assess, manipulate, and support vital system function(s), to treat the vital organ system failure and/or prevent further life threatening deterioration of the patient's condition.  critical care time: I spent 39 minutes personally attending to this patient's critical care needs.  This critical care time is exclusive of any time for separately billable procedures.  My documented total critical care time reflects my own patient care time and does not include teaching or procedure time.Reche Idler, MD

## 2023-11-25 NOTE — Progress Notes (Signed)
 Infectious Diseases Follow Up NoteLOS: 12  daysSeptember 15, 2025 ID problem:   F/U shockPersonally reviewed chart in detail including micro and imaging reports, labs, and current medications Interval history: On 12 L rebreather, levo 25, vaso 0.04.  Afebrile, WBC 26.1 (39.1).  S/p angiography 9/14 with IR for cecal bleeding, no hematoma discovered.Subjective/ROS  Does not contributeObjective:Temp:  [35.2 C (95.4 F)-36.1 C (96.9 F)] 35.6 C (96.1 F)Heart Rate:  [70-130] 71Resp:  [7-26] 17SpO2:  [77 %-100 %] 88 %O2 Flow Rate:  [8 L/min-15 L/min] 12 L/minPhysical Exam:General Appearance:  appears critically ill with bair huggerHEENT: scleral anicteric, MMM, oropharynx clear Pulm: clear to auscultation bilaterally, tachypnea on 12L rebreatherCV: regular rate, no murmur Abdomen: soft, BS normalMSK: R shoulder in sling, RUE swellingSkin: no rashes on visible skin surfaces Neuro: intermittently awake, disorientedLines: HDTL L IJ, TCVC L femoral, A line leftLabs:Recent Labs Lab 09/14/252324 09/14/251740 09/14/251314 09/13/252334 09/13/250016 WBC 26.1*  --   --  39.1* 35.2* Hemoglobin 17.7* 16.7 8.5* 9.9* 10.5* Hematocrit 53* 51 29* 33* 35* Platelets 214 204 524* 612* 688* Seg Neut % 88.8  --   --  80.1 82.3  Recent Labs Lab 09/14/252324 09/14/251740 09/14/251219 Sodium 137 137 134 Potassium 3.7 3.7 3.9 Chloride 101 100 99 CO2 18* 19* 18* UN 31* 30* 30* Creatinine 2.40* 2.27* 2.38* Glucose 127* 113* 109* Calcium  8.8 9.3 8.7 Lab Results Component Value Date/Time  CRP 233 (H) 10/09/2023 0034  CRP 219 (H) 10/07/2023 0046  CRP 273 (H) 10/05/2023 0548 Recent Labs Lab 09/11/250554 Vancomycin  Level 14.2  Microbiology: 9/3 COVID/flu/RSV - negative9/8 blood cultures - NGTD9/10 biliary culture - E faecalis9/12 pleural fluid - negative9/12 blood cultures -  NGTD9/13 C diff PCR - negativeAntimicrobials this admission:Cefazolin  9/6, 9/8 - 9/9Cefepime 9/9 - presentCeftriaxone 9/10Metronidazole 9/9 - presentVancomycin 9/9 - 9/11Linezolid 9/12Imaging/Other Relevant Diagnostics: 9/14 CTA chest:Markedly suboptimal evaluation of the pulmonary arteries due to poor contrast opacification and significant cardiorespiratory motion artifact.  No central pulmonary embolism is identified. The more peripheral pulmonary arterial vasculature is not well evaluated in this examination.  Redemonstration of extensive sequelae of metastatic disease involving the chest, including bulky mediastinal lymphadenopathy, bilateral lung and pleural lesions, extensive osseous metastatic disease, and multiple soft tissue implants.  Slight decrease of persistent moderate to large left-sided pleural fluid. No significant change in small loculated right-sided pleural fluid. Findings are compatible sequelae of malignant pleural disease.  Increased extensive bilateral right greater than left mid to lower lung predominant peribronchial and peripheral groundglass and part solid opacification, likely infection/inflammation.  Diffuse edema of the partially visualized bilateral shoulders without clearly identified focal fluid collections. Please note that the shoulders are incompletely evaluated in these examinations and if there is persistent clinical concern for significant fluid collections, further evaluation could be performed with ultrasound imaging. 9/14 CTA A/P:Suboptimal angiographic phase images.  There is active GI bleeding within the cecum. There is extensive clot throughout the right colon.  The cecum is dilated and anteriorly located, the positioning is similar to prior imaging, but the dilation is new. This may represent a an evolving ileus or cecal bascule.  Unchanged metastatic disease. 9/15 RKM:Pwuzmcjo increase in right  lower lung opacities suggestive of increased pleural fluid and associated atelectasis. Left retrocardiac opacity appears worsened as compared to prior study and can be due to atelectasis/aspiration/pneumonia.  Bilateral airspace opacities representing alveolar edema versus multifocal infection/inflammation.  Bilateral small pleural effusions.  Assessment and Plan:This is a 63 y.o. 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  Would continue empiric antibiotics at this time.Recommendations: Continue caspofungin , linezolid , meropenem , +/-tobramycinFollow blood culturesMonitor CBC with diff (platelets) while on linezolidID Team 2 will continue to follow along Patient seen with and case/plan discussed with ID attending, Dr. Hope Thank you for allowing us  to participate in the care of this patientPlease call with Rochelle SATTERFIELD, PA Weekend/Evenings/Nights: contact ID Fellow on call.

## 2023-11-25 NOTE — Progress Notes (Signed)
 11/25/23 1119 Visit Attempted Visit Attempted Visit Attempted Visit Attempted Comment Pt with acute GIB & hemorrhagic shock, new since last OT visit (last week). Now on CRRT. Not currently appropriate for OT services. Will continue to follow peripherally. [place] Dartanion Teo, OTR/L

## 2023-11-25 NOTE — Progress Notes (Signed)
 Report Given ToJulia S, RNSydney, RNActive Issues / Relevant Events Nursing note: ICU 1900-0700Pt only responsive to pain. Continues to tolerate 8L MFNC, will desat when laid flat. Observed clot forming at the roof of pt mouth. Provider made aware. Pt continues to require pressors to meet systolic goals of 85. Levo 18 & vas at 0.02. Pt traveled to IR for stat angiography, tolerated procedure well. Site assessed with no signs of hematoma. CRRT restarted. Filter changed x1. Wife at bedside. No prns given.  Please see Doc Flowsheet for VS, assessment detail, and nursing interventions.Odell Fey, RN

## 2023-11-25 NOTE — Interim Hospital Course Summary (Addendum)
 Interim Summary for long stay patientHospital Problem List:ACTIVE  Diagnosis Date Noted  *!*Other closed displaced fracture of proximal end of right humerus, initial encounter [S42.291A] 11/13/2023  Failure to thrive in adult [R62.7] 09/29/2023  Hypotension [I95.9] 11/05/2017  ESRD (end stage renal disease) on dialysis [N18.6, Z99.2] 11/29/2016  RESOLVED No resolved problems to display. HospCourse Shane Pearson is a 63 y.o. male with PMH significant for stage IV carcinoma (likely RCC) with intracranial and spinal mets, receiving radiation therapy, ESRD on HD tues/thurs/Saturday, HFpEF, who presented to [facility] 9/3 from urgent care after mechanical fall resulting in right humerus fracture. He had intermittent hypotension for several days, ultimately developing septic shock, necessitating ICU admission 9/9.  Source of sepsis appeared to be biliary s/p perc cholecystostomy tube 9/10 with Enterococcus however blood cultures were not positive and he remains on dual vasopressors after drain placement giving concern for another process as subsequent imaging shows gallbladder is adequately drained by perc drain. Course further c/b acute lower GI bleed, found to be luminal in cecum on CT and required MTP, however no active bleeding when arrived in IR. He is DNR/DNI and no tube feeding of any kind.  Wife asked for another week 9/14 before considering CMO.Follow Up:ID recsFluid removal once vasopressor needs decreaseContinue GOC conversations with wife  Do NOT erase these  green markers that allow the Discharge Summary to pull in this Hospital Course data.First Signed: [name], NP  On: 11/25/2023  at: 2:50 PM

## 2023-11-26 LAB — CHOLESTEROL, BODY FLUID: Cholesterol,FL: 64 mg/dL

## 2023-11-26 LAB — ANAEROBIC BACTERIAL PLEURAL OR THORACENTESIS FLUID CULTURE: Anaerobic bacterial pleural or thoracentesis fluid culture: 0

## 2023-11-27 ENCOUNTER — Telehealth: Payer: Self-pay | Admitting: Oncology

## 2023-11-27 LAB — MEDICAL CYTOLOGY

## 2023-11-27 LAB — SURGICAL PATHOLOGY

## 2023-11-27 LAB — AEROBIC BACTERIAL PLEURAL OR THORACENTESIS FLUID CULTURE AND STAIN: Aerobic bacterial pleural or thoracentesis fluid culture and stain: 0

## 2023-11-27 NOTE — Telephone Encounter (Signed)
 I called Shane Pearson's spouse, Shane Pearson, this morning to express my condolences of Shane Pearson's passing.  Shane Pearson understands that she can contact us  if we can be of any assistance  Shane Pearson appreciated follow up.Shane Pearson Bronson, MDProfessor of Medicine and Hydrographic surveyor of Solid Tumor Oncology ProgramJames P. Watch Hill Cancer InstituteDivision of Hematology/OncologyDepartment of Medicine

## 2023-11-28 ENCOUNTER — Telehealth: Admitting: Radiation Oncology

## 2023-11-28 LAB — BLOOD CULTURE: Bacterial Blood Culture: 0

## 2023-12-01 LAB — LEGIONELLA CULTURE: Legionella Culture: 0

## 2023-12-02 ENCOUNTER — Other Ambulatory Visit: Payer: Self-pay

## 2023-12-03 LAB — EKG 12-LEAD
P: 61 deg
PR: 144 ms
QRS: 35 deg
QRSD: 76 ms
QT: 328 ms
QTc: 434 ms
Rate: 105 {beats}/min
T: 58 deg

## 2023-12-04 ENCOUNTER — Ambulatory Visit

## 2023-12-04 LAB — EKG 12-LEAD
P: 82 deg
PR: 138 ms
QRS: 50 deg
QRSD: 98 ms
QT: 411 ms
QTc: 424 ms
Rate: 64 {beats}/min
T: 57 deg

## 2023-12-09 ENCOUNTER — Other Ambulatory Visit: Payer: Self-pay

## 2023-12-10 ENCOUNTER — Encounter: Payer: Self-pay | Admitting: Nephrology

## 2023-12-11 NOTE — Progress Notes (Signed)
 Physical Therapy Treatment Note 11/12/2023 0748 PT Tracking PT TRACKING PT Discontinue(Pt has transitioned to CMO and provider discontinued PT order. Please feel free to re-order if needs arrise.) Visit Number Visit Number [facility]) / Treatment Day [facility]) 0 Assessment Brief Assessment Provider has discontinued PT order Camie Heater, PT, DPTPlease contact physical therapist on the treatment team via secure chat or via the secure chat group for your unit Physical Therapist with any questions.On weekends, please utilize the secure chat group, [facility] Physical Therapy 1st call, to contact PT.

## 2023-12-11 NOTE — Progress Notes (Signed)
 Report Given ToChantel, RNDescriptive Sentence / Reason for Admission Shane Pearson is a 63 y.o. male with PMH significant for stage IV carcinoma (likely RCC) with intracranial and spinal mets, receiving radiation therapy, ESRD on HD tues/thurs/Saturday, HFpEF, who presented to [facility] 9/3 from urgent care after mechanical fall resulting in right humerus fracture. He had intermittent hypotension for several days, ultimately developing septic shock, necessitating ICU admission 9/9.  Source of sepsis appeared to be biliary s/p perc cholecystostomy.9/9: Transferred to 636i for hypotension, levo gtt started9/12: CRRT started, vaso gtt added, CT abd/pelvis, thoracentesis at bedside9/14 D12: MTP for GI bleed, CT for abd/pelvis/chest, O2 increased to 8LNC, IR - no bleed foundActive Issues / Relevant Events Nursing note 63600i 1900-0700:Upon initial assessment, O2 probe was not reading well. ABG was done to ensure adequate oxygenation, no new orders. Patient's son arrived from out of town, and family expressed interest in having a meeting about GOC. Nursing and covering PA met with family to provide education on patient status and options for care. Family decided to go CMO at around 2330. CRRT was disconnected and pressors were stopped. HFNC removed and replaced with NC 2L for comfort. Continued to monitor for signs of pain, dyspnea, or discomfort for remainder of shift. PRN dilaudid  given x1 with positive effect. Wife remains at bedside.Please see doc flowsheets and MAR for additional information and interventions.  Lacinda, RN To Do ListAnticipatory Guidance / Discharge PlanningABCDEF Bundle 834Patient likes to be called: GregLDAs: R fem CVC 3 lumen, perc drain, NGT LIWS, HD cathVent settings:ETT holder ? ilz:Ejpw Management: dilaudid  Q1 PRNSedation:Sedation holiday/SAT?Other Cont. Drips: levo, vasoSBT/PST/TCT:CAM-ICU score:Delirium  Management:Mobility/PT recs:Last BM: 9/14Family concerns/Updates:[place] Notified:Disposition:Acuity level:

## 2023-12-11 NOTE — Progress Notes (Signed)
 Medical ICU Progress Note LOS: 13 Days Interval History/Subjective: - Family has opted to pursue comfort measures onlyObjective Vital Signs: BP 106/64 (BP Location: Left arm)   Pulse 65   Temp 36.4 C (97.6 F) (Axillary)   Resp 22   Ht 1.753 m (5' 9)   Wt 77.6 kg (171 lb)   SpO2 94%   BMI 25.25 kg/m  Physical ExamPhysical Exam General: NADHEENT: NC in placePulm: Comfortable breathing Cardiac: s1s2GI: abdomen soft, NT/ND GU: aneuric Extremities: wwp Integument: wwp  Assessment: Shane Pearson is a 63 y.o. male with PMH significant for stage IV carcinoma (likely RCC) with intracranial and spinal mets, receiving radiation therapy, ESRD on HD tues/thurs/Saturday, HFpEF, who presented to [facility] 9/3 from urgent care after mechanical fall resulting in right humerus fracture.He had intermittent hypotension for several days, ultimately developing septic shock, necessitating ICU admission 9/9.  Source of sepsis appeared to be biliary s/p perc cholecystostomy tube 9/10 with Enterococcus however blood cultures were not positive and he remains on dual vasopressors after drain placement giving concern for another process as subsequent imaging shows gallbladder is adequately drained by perc drain.Course further c/b acute lower GI bleed, found to be luminal in cecum on CT and required MTP, however no active bleeding when arrived in IR.He is DNR/DNI and no tube feeding of any kind. He is now CMO after further family discussionPlan: Neuro/Psych: Comfort measures only- Versed  0.5 mg q15 PRN- Hydromorphone  0.5 mg q15 PRN- Melatonin for sleep aidSocial: - HCP/Surrogate: Wife, Grier Vu, [phone] - Last provider update: 9/14; discussed it would be appropriate to switch to comfort approach at any time.  After discussing with family she asked for another week.  New hemorrhagic shock did not change this for her.- GOC addressed this ICU admission? Yes;  DNR/DNI as of this admission given tradjectory Medication Reconciliation Completed: Yes Code Status: DNR/DNI Plan of care discussed with: nursing, attending Diagnoses for this hospitalization include: Septic shock, metastatic renal cell carcinoma Dispo: Pending clinical improvement Debby Dallas, PA Pulmonary/Critical Care Medicine

## 2023-12-11 NOTE — Progress Notes (Signed)
 Contacts: Patients familyBlanca ([facility]) Phone: [phone] Intervention: Writer met with patients family bedside to offer support and express condolences. Writer provided patient's family with voucher for TEN gratis parking passes.  The patient has received a total of TEN gratis parking vouchers this admission. Writer explained that this voucher needs to be redeemed at parking office on the ground floor of the garage. Writer notified the patient's family regarding parking options on [facility]. Writer contacted patients outpatient HD clinic and spoke with Orene to inform her of patients passing. Orene states she will alert the medical team there. Plan: Social Work will continue to provide support as needed. Markisha Meding, LMSWPhone: [phone]

## 2023-12-11 NOTE — Discharge Summary (Signed)
 Name: Shane Pearson MRN: [id number] DOB: 22-Jan-1961   Admit Date: 12-03-23  Date of Death: 10-06-25Hospitalization SummaryConcise Narrative: Shane Pearson is a 63 y.o. male with PMH significant for stage IV carcinoma (likely RCC) with intracranial and spinal mets, receiving radiation therapy, ESRD on HD tues/thurs/Saturday, HFpEF, who presented to [facility] 2023/12/03 from urgent care after mechanical fall resulting in right humerus fracture. He had intermittent hypotension for several days, ultimately developing septic shock, necessitating ICU admission 9/9.  Source of sepsis appeared to be biliary s/p perc cholecystostomy tube 9/10 with Enterococcus however blood cultures were not positive and he remains on dual vasopressors after drain placement giving concern for another process as subsequent imaging shows gallbladder is adequately drained by perc drain. Course further c/b acute lower GI bleed, found to be luminal in cecum on CT and required MTP, however no active bleeding when arrived in IR. After further family discussion, they did not want further aggressive medical treatment and opted to pursue a comfort approachHe passed peacefully with family at bedside on 12/16/2023 at 1304Signed: Debby Dallas, PA  On: 2023/12/16  at: 1:09 PM

## 2023-12-11 NOTE — Progress Notes (Signed)
 Patients belonging slip given to patients wife, to retrieve valuables at cashier office. Shane Pearson Console, RN

## 2023-12-11 NOTE — Progress Notes (Signed)
 SPIRITUAL ASSESSMENT NOTE       Chaplain Interventions: Silent Prayer and PresencePlan of Care:  Chaplains available 24/7Provided silent prayer and presence outside room while rounding on unit.Shane Pearson    11:23 AM on 12/07/2023

## 2023-12-11 NOTE — Progress Notes (Addendum)
 Critical Care Attending NoteAssessment and plan:Persistent multiorgan failure, acutely related to septic shock and lower GI bleed (presenting with mechanical fall and right humerus fracture) and in the bigger context of metastatic malignancy (suspected RCC) and ESRD on HD at baseline. Wife at bedside and ultimately decided on comfort measures only yesterday evening. Pain and anxiety appear well controlled this morning with intermittent IV dilaudid  and IV versed . Family has been visiting. DNR/DNI________________________________________________________________Key historical elements and objective findings are recorded below.  The key elements of my assessment and plans are listed above.  ObjectiveBP 106/64 (BP Location: Left arm)   Pulse (!) 113   Temp 36.4 C (97.6 F) (Axillary)   Resp 23   Ht 1.753 m (5' 9)   Wt 77.6 kg (171 lb)   SpO2 94%   BMI 25.25 kg/m  Rhonchi bilaterally. Does not appear uncomfortable. ________________________________________________________________This patient was evaluated on rounds with Debby Dallas.  I personally examined the patient.  All nursing documentation, laboratory data, test results, and radiographs were reviewed and interpreted by me.  I have established the management plan for this patient's critical illness and have been immediately available to assist with patient care.  I agree with the database, findings, and plan of care recorded in the PA's note.Reche Idler, MD

## 2023-12-11 NOTE — Significant Event (Signed)
 Death NoteCalled to patient's bedside for absence of respirations.Exam:  Patient was not responsive to voice or touch. Absent carotid pulse. No chest rise. No cardiac or respiratory sounds on auscultation for 30 seconds. Pupils fixed, nonreactive.Time of death: 1304Family  Rocco Kerkhoff present and notifiedNext of kin  [name] Wileen Polly Reche, MD notified via Rondall Dallas, [place] 1:05 PM 12/03/2023

## 2023-12-11 NOTE — Progress Notes (Signed)
 Assumed pt care [phone]: Patient comfort measures only, family at bedside. PRN Dilaudid  and Versed  given. Patient passed away at 1304. Please see flowsheets for VS, assessment detail, and nursing interventions.Dalton CHRISTELLA Console, RN

## 2023-12-11 DEATH — deceased

## 2023-12-16 LAB — FUNGAL PLEURAL OR THORACENTESIS FLUID CULTURE: Fungal pleural or thoracentesis fluid culture: 0

## 2024-01-03 ENCOUNTER — Other Ambulatory Visit

## 2024-01-07 LAB — AFB PLEURAL OR THORACENTESIS FLUID CULTURE AND STAIN: AFB pleural or thoracentesis fluid culture and stain: 0

## 8039-06-11 DEATH — deceased
